# Patient Record
Sex: Male | Born: 1940 | Race: White | Hispanic: No | Marital: Married | Smoking: Former smoker
Health system: Southern US, Community
[De-identification: ages and names within clinical notes are randomized; demographics above are authoritative.]

## PROBLEM LIST (undated history)

## (undated) DIAGNOSIS — E291 Testicular hypofunction: Secondary | ICD-10-CM

## (undated) DIAGNOSIS — I350 Nonrheumatic aortic (valve) stenosis: Secondary | ICD-10-CM

## (undated) DIAGNOSIS — I10 Essential (primary) hypertension: Secondary | ICD-10-CM

## (undated) DIAGNOSIS — E785 Hyperlipidemia, unspecified: Secondary | ICD-10-CM

## (undated) DIAGNOSIS — I739 Peripheral vascular disease, unspecified: Secondary | ICD-10-CM

## (undated) DIAGNOSIS — N289 Disorder of kidney and ureter, unspecified: Secondary | ICD-10-CM

## (undated) DIAGNOSIS — K859 Acute pancreatitis without necrosis or infection, unspecified: Secondary | ICD-10-CM

## (undated) DIAGNOSIS — R011 Cardiac murmur, unspecified: Secondary | ICD-10-CM

## (undated) DIAGNOSIS — T7840XA Allergy, unspecified, initial encounter: Secondary | ICD-10-CM

## (undated) HISTORY — DX: Allergy, unspecified, initial encounter: T78.40XA

## (undated) HISTORY — DX: Nonrheumatic aortic (valve) stenosis: I35.0

## (undated) HISTORY — DX: Hyperlipidemia, unspecified: E78.5

## (undated) HISTORY — DX: Essential (primary) hypertension: I10

## (undated) HISTORY — DX: Acute pancreatitis without necrosis or infection, unspecified: K85.90

## (undated) HISTORY — DX: Disorder of kidney and ureter, unspecified: N28.9

## (undated) HISTORY — DX: Testicular hypofunction: E29.1

## (undated) HISTORY — PX: EYE SURGERY: SHX253

## (undated) HISTORY — PX: TONSILLECTOMY: SUR1361

## (undated) HISTORY — DX: Peripheral vascular disease, unspecified: I73.9

## (undated) HISTORY — PX: CARDIAC CATHETERIZATION: SHX172

---

## 1999-12-31 ENCOUNTER — Encounter: Payer: Self-pay | Admitting: Internal Medicine

## 1999-12-31 ENCOUNTER — Ambulatory Visit (HOSPITAL_COMMUNITY): Admission: RE | Admit: 1999-12-31 | Discharge: 1999-12-31 | Payer: Self-pay | Admitting: Internal Medicine

## 2004-11-03 ENCOUNTER — Ambulatory Visit: Payer: Self-pay | Admitting: Internal Medicine

## 2004-11-07 ENCOUNTER — Ambulatory Visit: Payer: Self-pay | Admitting: Internal Medicine

## 2004-12-24 ENCOUNTER — Ambulatory Visit: Payer: Self-pay | Admitting: Internal Medicine

## 2006-02-10 ENCOUNTER — Ambulatory Visit: Payer: Self-pay | Admitting: Internal Medicine

## 2006-02-11 ENCOUNTER — Ambulatory Visit: Payer: Self-pay | Admitting: Internal Medicine

## 2006-06-08 ENCOUNTER — Ambulatory Visit: Payer: Self-pay | Admitting: Internal Medicine

## 2006-06-11 ENCOUNTER — Ambulatory Visit: Payer: Self-pay | Admitting: Internal Medicine

## 2006-06-18 ENCOUNTER — Ambulatory Visit: Payer: Self-pay | Admitting: Internal Medicine

## 2006-10-06 ENCOUNTER — Ambulatory Visit: Payer: Self-pay | Admitting: Internal Medicine

## 2006-10-06 LAB — CONVERTED CEMR LAB
ALT: 33 units/L (ref 0–40)
AST: 20 units/L (ref 0–37)
Albumin: 3.5 g/dL (ref 3.5–5.2)
Alkaline Phosphatase: 64 units/L (ref 39–117)
BUN: 11 mg/dL (ref 6–23)
CO2: 29 meq/L (ref 19–32)
Calcium: 9 mg/dL (ref 8.4–10.5)
Chloride: 105 meq/L (ref 96–112)
Chol/HDL Ratio, serum: 4.6
Cholesterol: 199 mg/dL (ref 0–200)
Creatinine, Ser: 1.1 mg/dL (ref 0.4–1.5)
GFR calc non Af Amer: 71 mL/min
Glomerular Filtration Rate, Af Am: 86 mL/min/{1.73_m2}
Glucose, Bld: 169 mg/dL — ABNORMAL HIGH (ref 70–99)
HDL: 43 mg/dL (ref >39.0)
Hgb A1c MFr Bld: 7.2 % — ABNORMAL HIGH (ref 4.6–6.0)
LDL Cholesterol: 122 mg/dL — ABNORMAL HIGH (ref 0–99)
Potassium: 3.7 meq/L (ref 3.5–5.1)
Sodium: 139 meq/L (ref 135–145)
Total Bilirubin: 0.6 mg/dL (ref 0.3–1.2)
Total Protein: 6.5 g/dL (ref 6.0–8.3)
Triglyceride fasting, serum: 171 mg/dL — ABNORMAL HIGH (ref 0–149)
VLDL: 34 mg/dL (ref 0–40)

## 2006-10-07 ENCOUNTER — Ambulatory Visit: Payer: Self-pay | Admitting: Internal Medicine

## 2006-11-30 DIAGNOSIS — K859 Acute pancreatitis without necrosis or infection, unspecified: Secondary | ICD-10-CM

## 2006-11-30 HISTORY — DX: Acute pancreatitis without necrosis or infection, unspecified: K85.90

## 2006-12-07 ENCOUNTER — Ambulatory Visit: Payer: Self-pay | Admitting: Internal Medicine

## 2007-01-17 ENCOUNTER — Ambulatory Visit: Payer: Self-pay | Admitting: Internal Medicine

## 2007-06-29 ENCOUNTER — Ambulatory Visit: Payer: Self-pay | Admitting: Internal Medicine

## 2007-06-29 LAB — CONVERTED CEMR LAB
ALT: 18 units/L (ref 0–53)
AST: 16 units/L (ref 0–37)
Albumin: 3.8 g/dL (ref 3.5–5.2)
Alkaline Phosphatase: 70 units/L (ref 39–117)
Amylase: 69 units/L (ref 27–131)
BUN: 9 mg/dL (ref 6–23)
Bilirubin, Direct: 0.3 mg/dL (ref 0.0–0.3)
CO2: 29 meq/L (ref 19–32)
Calcium: 9.4 mg/dL (ref 8.4–10.5)
Chloride: 99 meq/L (ref 96–112)
Creatinine, Ser: 1.3 mg/dL (ref 0.4–1.5)
GFR calc Af Amer: 71 mL/min
GFR calc non Af Amer: 58 mL/min
Glucose, Bld: 176 mg/dL — ABNORMAL HIGH (ref 70–99)
Lipase: 72 units/L — ABNORMAL HIGH (ref 11.0–59.0)
Potassium: 3.7 meq/L (ref 3.5–5.1)
Sodium: 136 meq/L (ref 135–145)
Total Bilirubin: 1.7 mg/dL — ABNORMAL HIGH (ref 0.3–1.2)
Total Protein: 7.2 g/dL (ref 6.0–8.3)

## 2007-06-30 ENCOUNTER — Ambulatory Visit: Payer: Self-pay | Admitting: Internal Medicine

## 2007-06-30 LAB — CONVERTED CEMR LAB
ALT: 19 units/L (ref 0–53)
AST: 20 units/L (ref 0–37)
Albumin: 3.6 g/dL (ref 3.5–5.2)
Alkaline Phosphatase: 63 units/L (ref 39–117)
Amylase: 67 units/L (ref 27–131)
Bilirubin, Direct: 0.2 mg/dL (ref 0.0–0.3)
Lipase: 87 units/L — ABNORMAL HIGH (ref 11.0–59.0)
Total Bilirubin: 1.3 mg/dL — ABNORMAL HIGH (ref 0.3–1.2)
Total Protein: 7.5 g/dL (ref 6.0–8.3)

## 2007-07-01 DIAGNOSIS — F528 Other sexual dysfunction not due to a substance or known physiological condition: Secondary | ICD-10-CM | POA: Insufficient documentation

## 2007-07-01 DIAGNOSIS — R609 Edema, unspecified: Secondary | ICD-10-CM | POA: Insufficient documentation

## 2007-07-01 DIAGNOSIS — E785 Hyperlipidemia, unspecified: Secondary | ICD-10-CM | POA: Insufficient documentation

## 2007-07-01 DIAGNOSIS — E291 Testicular hypofunction: Secondary | ICD-10-CM | POA: Insufficient documentation

## 2007-07-01 DIAGNOSIS — B351 Tinea unguium: Secondary | ICD-10-CM | POA: Insufficient documentation

## 2007-07-01 DIAGNOSIS — I10 Essential (primary) hypertension: Secondary | ICD-10-CM | POA: Insufficient documentation

## 2007-07-28 ENCOUNTER — Ambulatory Visit: Payer: Self-pay | Admitting: Internal Medicine

## 2007-07-28 LAB — CONVERTED CEMR LAB
ALT: 18 units/L (ref 0–53)
AST: 16 units/L (ref 0–37)
Albumin: 3.6 g/dL (ref 3.5–5.2)
Alkaline Phosphatase: 58 units/L (ref 39–117)
BUN: 14 mg/dL (ref 6–23)
Bilirubin, Direct: 0.1 mg/dL (ref 0.0–0.3)
CO2: 30 meq/L (ref 19–32)
Calcium: 9.2 mg/dL (ref 8.4–10.5)
Chloride: 105 meq/L (ref 96–112)
Creatinine, Ser: 1 mg/dL (ref 0.4–1.5)
GFR calc Af Amer: 96 mL/min
GFR calc non Af Amer: 79 mL/min
Glucose, Bld: 138 mg/dL — ABNORMAL HIGH (ref 70–99)
Lipase: 47 units/L (ref 11.0–59.0)
Potassium: 4.1 meq/L (ref 3.5–5.1)
Sodium: 142 meq/L (ref 135–145)
Total Bilirubin: 0.5 mg/dL (ref 0.3–1.2)
Total Protein: 6.6 g/dL (ref 6.0–8.3)

## 2007-08-03 ENCOUNTER — Ambulatory Visit: Payer: Self-pay | Admitting: Internal Medicine

## 2007-09-22 ENCOUNTER — Ambulatory Visit: Payer: Self-pay | Admitting: Internal Medicine

## 2007-11-01 ENCOUNTER — Ambulatory Visit: Payer: Self-pay | Admitting: Internal Medicine

## 2007-11-01 DIAGNOSIS — E118 Type 2 diabetes mellitus with unspecified complications: Secondary | ICD-10-CM | POA: Insufficient documentation

## 2007-11-01 LAB — CONVERTED CEMR LAB: Blood Glucose, Fingerstick: 139

## 2008-01-09 ENCOUNTER — Encounter: Payer: Self-pay | Admitting: Internal Medicine

## 2008-02-15 ENCOUNTER — Emergency Department (HOSPITAL_COMMUNITY): Admission: EM | Admit: 2008-02-15 | Discharge: 2008-02-15 | Payer: Self-pay | Admitting: Emergency Medicine

## 2008-03-19 ENCOUNTER — Ambulatory Visit: Payer: Self-pay | Admitting: Internal Medicine

## 2008-03-20 ENCOUNTER — Ambulatory Visit: Payer: Self-pay | Admitting: Internal Medicine

## 2008-03-20 LAB — CONVERTED CEMR LAB
ALT: 30 units/L (ref 0–53)
Albumin: 3.8 g/dL (ref 3.5–5.2)
Alkaline Phosphatase: 47 units/L (ref 39–117)
BUN: 12 mg/dL (ref 6–23)
CO2: 31 meq/L (ref 19–32)
Calcium: 9.2 mg/dL (ref 8.4–10.5)
GFR calc Af Amer: 85 mL/min
Glucose, Bld: 157 mg/dL — ABNORMAL HIGH (ref 70–99)
HDL: 45.5 mg/dL (ref >39.0)
Potassium: 3.8 meq/L (ref 3.5–5.1)
TSH: 2.62 microintl units/mL (ref 0.35–5.50)
Total Protein: 6.5 g/dL (ref 6.0–8.3)
Triglycerides: 99 mg/dL (ref 0–149)

## 2008-05-29 ENCOUNTER — Encounter: Payer: Self-pay | Admitting: Internal Medicine

## 2008-07-27 ENCOUNTER — Ambulatory Visit: Payer: Self-pay | Admitting: Internal Medicine

## 2008-07-27 LAB — CONVERTED CEMR LAB
BUN: 14 mg/dL (ref 6–23)
CO2: 30 meq/L (ref 19–32)
Calcium: 9.3 mg/dL (ref 8.4–10.5)
Chloride: 106 meq/L (ref 96–112)
Creatinine, Ser: 1.1 mg/dL (ref 0.4–1.5)
Hgb A1c MFr Bld: 6.2 % — ABNORMAL HIGH (ref 4.6–6.0)

## 2008-07-31 ENCOUNTER — Ambulatory Visit: Payer: Self-pay | Admitting: Internal Medicine

## 2008-09-11 ENCOUNTER — Ambulatory Visit: Payer: Self-pay | Admitting: Internal Medicine

## 2008-09-13 ENCOUNTER — Telehealth: Payer: Self-pay | Admitting: Internal Medicine

## 2008-09-14 ENCOUNTER — Ambulatory Visit: Payer: Self-pay | Admitting: Internal Medicine

## 2008-10-22 ENCOUNTER — Telehealth: Payer: Self-pay | Admitting: Internal Medicine

## 2008-12-03 ENCOUNTER — Ambulatory Visit: Payer: Self-pay | Admitting: Internal Medicine

## 2008-12-03 LAB — CONVERTED CEMR LAB
Calcium: 9.5 mg/dL (ref 8.4–10.5)
Direct LDL: 99.9 mg/dL
GFR calc Af Amer: 70 mL/min
GFR calc non Af Amer: 58 mL/min
Glucose, Bld: 150 mg/dL — ABNORMAL HIGH (ref 70–99)
HDL: 54 mg/dL (ref >39.0)
Hgb A1c MFr Bld: 6.5 % — ABNORMAL HIGH (ref 4.6–6.0)
Potassium: 4.1 meq/L (ref 3.5–5.1)
Sodium: 142 meq/L (ref 135–145)
Total CHOL/HDL Ratio: 3.6
Triglycerides: 215 mg/dL (ref 0–149)
VLDL: 43 mg/dL — ABNORMAL HIGH (ref 0–40)

## 2008-12-05 ENCOUNTER — Ambulatory Visit: Payer: Self-pay | Admitting: Internal Medicine

## 2008-12-28 ENCOUNTER — Telehealth: Payer: Self-pay | Admitting: Internal Medicine

## 2009-02-15 ENCOUNTER — Ambulatory Visit: Payer: Self-pay | Admitting: Internal Medicine

## 2009-02-15 DIAGNOSIS — R9431 Abnormal electrocardiogram [ECG] [EKG]: Secondary | ICD-10-CM | POA: Insufficient documentation

## 2009-02-20 ENCOUNTER — Ambulatory Visit: Payer: Self-pay | Admitting: Internal Medicine

## 2009-02-20 ENCOUNTER — Inpatient Hospital Stay (HOSPITAL_COMMUNITY): Admission: AD | Admit: 2009-02-20 | Discharge: 2009-02-21 | Payer: Self-pay | Admitting: Internal Medicine

## 2009-02-20 ENCOUNTER — Ambulatory Visit: Payer: Self-pay | Admitting: Cardiology

## 2009-02-20 ENCOUNTER — Encounter: Payer: Self-pay | Admitting: Internal Medicine

## 2009-02-22 ENCOUNTER — Telehealth: Payer: Self-pay | Admitting: Internal Medicine

## 2009-02-25 ENCOUNTER — Telehealth (INDEPENDENT_AMBULATORY_CARE_PROVIDER_SITE_OTHER): Payer: Self-pay | Admitting: *Deleted

## 2009-02-25 ENCOUNTER — Ambulatory Visit: Payer: Self-pay | Admitting: Internal Medicine

## 2009-02-25 DIAGNOSIS — I359 Nonrheumatic aortic valve disorder, unspecified: Secondary | ICD-10-CM | POA: Insufficient documentation

## 2009-02-25 DIAGNOSIS — I739 Peripheral vascular disease, unspecified: Secondary | ICD-10-CM | POA: Insufficient documentation

## 2009-02-26 DIAGNOSIS — N183 Chronic kidney disease, stage 3 unspecified: Secondary | ICD-10-CM | POA: Insufficient documentation

## 2009-02-26 DIAGNOSIS — N1831 Chronic kidney disease, stage 3a: Secondary | ICD-10-CM | POA: Insufficient documentation

## 2009-02-27 ENCOUNTER — Telehealth (INDEPENDENT_AMBULATORY_CARE_PROVIDER_SITE_OTHER): Payer: Self-pay

## 2009-02-28 ENCOUNTER — Ambulatory Visit: Payer: Self-pay

## 2009-02-28 ENCOUNTER — Encounter: Payer: Self-pay | Admitting: Internal Medicine

## 2009-03-05 ENCOUNTER — Telehealth: Payer: Self-pay | Admitting: Internal Medicine

## 2009-03-11 ENCOUNTER — Ambulatory Visit: Payer: Self-pay | Admitting: Internal Medicine

## 2009-03-11 LAB — CONVERTED CEMR LAB: Cholesterol, target level: 200 mg/dL

## 2009-04-10 ENCOUNTER — Ambulatory Visit: Payer: Self-pay | Admitting: Internal Medicine

## 2009-04-10 DIAGNOSIS — E8881 Metabolic syndrome: Secondary | ICD-10-CM | POA: Insufficient documentation

## 2009-04-16 ENCOUNTER — Telehealth: Payer: Self-pay | Admitting: Internal Medicine

## 2009-05-09 ENCOUNTER — Encounter: Admission: RE | Admit: 2009-05-09 | Discharge: 2009-05-23 | Payer: Self-pay | Admitting: Internal Medicine

## 2009-05-09 ENCOUNTER — Encounter: Payer: Self-pay | Admitting: Internal Medicine

## 2009-06-13 ENCOUNTER — Ambulatory Visit: Payer: Self-pay | Admitting: Internal Medicine

## 2009-06-13 LAB — CONVERTED CEMR LAB
ALT: 26 units/L (ref 0–53)
Albumin: 3.8 g/dL (ref 3.5–5.2)
Alkaline Phosphatase: 53 units/L (ref 39–117)
Basophils Relative: 1.1 % (ref 0.0–3.0)
Bilirubin Urine: NEGATIVE
Bilirubin, Direct: 0.1 mg/dL (ref 0.0–0.3)
CO2: 29 meq/L (ref 19–32)
Chloride: 102 meq/L (ref 96–112)
Cholesterol: 192 mg/dL (ref 0–200)
Creatinine, Ser: 1.3 mg/dL (ref 0.4–1.5)
Eosinophils Absolute: 0.2 10*3/uL (ref 0.0–0.7)
Eosinophils Relative: 4.1 % (ref 0.0–5.0)
Hemoglobin, Urine: NEGATIVE
Hemoglobin: 14.8 g/dL (ref 13.0–17.0)
Ketones, ur: NEGATIVE mg/dL
Lymphocytes Relative: 30.2 % (ref 12.0–46.0)
MCHC: 34.9 g/dL (ref 30.0–36.0)
MCV: 91.1 fL (ref 78.0–100.0)
Monocytes Absolute: 0.7 10*3/uL (ref 0.1–1.0)
Neutro Abs: 2.4 10*3/uL (ref 1.4–7.7)
Neutrophils Relative %: 51.2 % (ref 43.0–77.0)
Potassium: 3.9 meq/L (ref 3.5–5.1)
RBC: 4.67 M/uL (ref 4.22–5.81)
Sodium: 138 meq/L (ref 135–145)
Total CHOL/HDL Ratio: 4
Total Protein, Urine: NEGATIVE mg/dL
Total Protein: 6.7 g/dL (ref 6.0–8.3)
VLDL: 42.8 mg/dL — ABNORMAL HIGH (ref 0.0–40.0)
WBC: 4.9 10*3/uL (ref 4.5–10.5)
pH: 6 (ref 5.0–8.0)

## 2009-06-14 ENCOUNTER — Encounter: Payer: Self-pay | Admitting: Internal Medicine

## 2009-07-01 ENCOUNTER — Telehealth: Payer: Self-pay | Admitting: Internal Medicine

## 2009-08-01 ENCOUNTER — Telehealth: Payer: Self-pay | Admitting: Internal Medicine

## 2009-08-15 ENCOUNTER — Telehealth: Payer: Self-pay | Admitting: Internal Medicine

## 2009-08-29 ENCOUNTER — Telehealth: Payer: Self-pay | Admitting: Internal Medicine

## 2009-09-02 ENCOUNTER — Ambulatory Visit: Payer: Self-pay | Admitting: Internal Medicine

## 2009-09-02 DIAGNOSIS — N400 Enlarged prostate without lower urinary tract symptoms: Secondary | ICD-10-CM | POA: Insufficient documentation

## 2009-09-02 LAB — CONVERTED CEMR LAB
BUN: 22 mg/dL (ref 6–23)
CO2: 30 meq/L (ref 19–32)
Chloride: 104 meq/L (ref 96–112)
Glucose, Bld: 114 mg/dL — ABNORMAL HIGH (ref 70–99)
Potassium: 4.2 meq/L (ref 3.5–5.1)
Sodium: 141 meq/L (ref 135–145)

## 2009-09-17 ENCOUNTER — Ambulatory Visit: Payer: Self-pay | Admitting: Internal Medicine

## 2009-10-01 ENCOUNTER — Ambulatory Visit: Payer: Self-pay | Admitting: Internal Medicine

## 2010-01-06 ENCOUNTER — Ambulatory Visit: Payer: Self-pay | Admitting: Internal Medicine

## 2010-01-06 LAB — CONVERTED CEMR LAB
ALT: 44 units/L (ref 0–53)
BUN: 18 mg/dL (ref 6–23)
Bilirubin Urine: NEGATIVE
CO2: 33 meq/L — ABNORMAL HIGH (ref 19–32)
Chloride: 104 meq/L (ref 96–112)
Eosinophils Absolute: 0.2 10*3/uL (ref 0.0–0.7)
Eosinophils Relative: 3.7 % (ref 0.0–5.0)
Glucose, Bld: 131 mg/dL — ABNORMAL HIGH (ref 70–99)
HCT: 43.3 % (ref 39.0–52.0)
Hemoglobin, Urine: NEGATIVE
Hgb A1c MFr Bld: 6.5 % (ref 4.6–6.5)
Leukocytes, UA: NEGATIVE
Lymphs Abs: 1.2 10*3/uL (ref 0.7–4.0)
MCHC: 32.8 g/dL (ref 30.0–36.0)
MCV: 96.4 fL (ref 78.0–100.0)
Monocytes Absolute: 0.5 10*3/uL (ref 0.1–1.0)
Nitrite: NEGATIVE
Platelets: 213 10*3/uL (ref 150.0–400.0)
Potassium: 4 meq/L (ref 3.5–5.1)
RDW: 12 % (ref 11.5–14.6)
Total Bilirubin: 0.8 mg/dL (ref 0.3–1.2)
Total Protein: 7.2 g/dL (ref 6.0–8.3)
WBC: 4.8 10*3/uL (ref 4.5–10.5)
pH: 6.5 (ref 5.0–8.0)

## 2010-01-14 ENCOUNTER — Telehealth: Payer: Self-pay | Admitting: Internal Medicine

## 2010-01-14 ENCOUNTER — Encounter: Payer: Self-pay | Admitting: Internal Medicine

## 2010-02-27 ENCOUNTER — Telehealth: Payer: Self-pay | Admitting: Internal Medicine

## 2010-04-03 ENCOUNTER — Telehealth: Payer: Self-pay | Admitting: Internal Medicine

## 2010-05-08 ENCOUNTER — Ambulatory Visit: Payer: Self-pay | Admitting: Internal Medicine

## 2010-05-08 LAB — CONVERTED CEMR LAB
BUN: 17 mg/dL (ref 6–23)
Bilirubin Urine: NEGATIVE
CO2: 29 meq/L (ref 19–32)
Chloride: 104 meq/L (ref 96–112)
Creatinine, Ser: 1.2 mg/dL (ref 0.4–1.5)
Ketones, ur: NEGATIVE mg/dL
Total Protein, Urine: NEGATIVE mg/dL
pH: 6 (ref 5.0–8.0)

## 2010-06-09 ENCOUNTER — Ambulatory Visit: Payer: Self-pay | Admitting: Internal Medicine

## 2010-07-22 ENCOUNTER — Ambulatory Visit: Payer: Self-pay | Admitting: Internal Medicine

## 2010-07-22 DIAGNOSIS — K219 Gastro-esophageal reflux disease without esophagitis: Secondary | ICD-10-CM | POA: Insufficient documentation

## 2010-07-25 ENCOUNTER — Ambulatory Visit: Payer: Self-pay | Admitting: Internal Medicine

## 2010-07-25 ENCOUNTER — Encounter: Payer: Self-pay | Admitting: Internal Medicine

## 2010-08-06 ENCOUNTER — Ambulatory Visit: Payer: Self-pay

## 2010-08-06 ENCOUNTER — Ambulatory Visit: Payer: Self-pay | Admitting: Cardiology

## 2010-08-06 ENCOUNTER — Encounter: Payer: Self-pay | Admitting: Internal Medicine

## 2010-08-06 ENCOUNTER — Ambulatory Visit (HOSPITAL_COMMUNITY): Admission: RE | Admit: 2010-08-06 | Discharge: 2010-08-06 | Payer: Self-pay | Admitting: Internal Medicine

## 2010-08-08 ENCOUNTER — Ambulatory Visit: Payer: Self-pay | Admitting: Internal Medicine

## 2010-09-03 ENCOUNTER — Telehealth: Payer: Self-pay | Admitting: Internal Medicine

## 2010-09-08 ENCOUNTER — Ambulatory Visit: Payer: Self-pay | Admitting: Internal Medicine

## 2010-09-08 LAB — CONVERTED CEMR LAB
AST: 21 units/L (ref 0–37)
BUN: 18 mg/dL (ref 6–23)
Basophils Absolute: 0.1 10*3/uL (ref 0.0–0.1)
Bilirubin Urine: NEGATIVE
Calcium: 9.8 mg/dL (ref 8.4–10.5)
Cholesterol: 183 mg/dL (ref 0–200)
Eosinophils Absolute: 0.3 10*3/uL (ref 0.0–0.7)
GFR calc non Af Amer: 55.77 mL/min (ref >60)
Glucose, Bld: 113 mg/dL — ABNORMAL HIGH (ref 70–99)
HCT: 43 % (ref 39.0–52.0)
HDL: 38.8 mg/dL — ABNORMAL LOW (ref >39.00)
Hemoglobin, Urine: NEGATIVE
Hgb A1c MFr Bld: 7.1 % — ABNORMAL HIGH (ref 4.6–6.5)
Leukocytes, UA: NEGATIVE
Lymphs Abs: 1.5 10*3/uL (ref 0.7–4.0)
MCHC: 34.7 g/dL (ref 30.0–36.0)
Monocytes Relative: 8.8 % (ref 3.0–12.0)
Nitrite: NEGATIVE
PSA: 0.76 ng/mL (ref 0.10–4.00)
Platelets: 271 10*3/uL (ref 150.0–400.0)
Potassium: 4.1 meq/L (ref 3.5–5.1)
RDW: 13.1 % (ref 11.5–14.6)
TSH: 2.81 microintl units/mL (ref 0.35–5.50)
Total Bilirubin: 0.7 mg/dL (ref 0.3–1.2)
Urobilinogen, UA: 0.2 (ref 0.0–1.0)
VLDL: 27.2 mg/dL (ref 0.0–40.0)

## 2010-12-30 NOTE — Letter (Signed)
Summary: Results Follow-up Letter  [facility]  [address] [place], [place] [address]   Phone: [phone]  Fax: [phone]    05/08/2010  [address] [place], [place]  [address]  Dear Mr. SCHUPP,   The following are the results of your recent test(s):  Test     Result     A1C=6.5     excellent Kidney/urine   normal   _________________________________________________________  Please call for an appointment as directed _________________________________________________________ _________________________________________________________ _________________________________________________________  Sincerely,  Sanda Linger MD  [facility]

## 2010-12-30 NOTE — Progress Notes (Signed)
Summary: RESULTS  Phone Note Call from Patient Call back at Home Phone [phone]   Caller: Patient Call For: Dr Yetta Barre Summary of Call: Pt requesting a call regarding his lab done 2/7. Please advise. Initial call taken by: Verdell Face,  January 14, 2010 12:34 PM  Follow-up for Phone Call        Please advise on results of labs. Follow-up by: Lamar Sprinkles, CMA,  January 14, 2010 1:26 PM  Additional Follow-up for Phone Call Additional follow up Details #1::        everything looks great Additional Follow-up by: [name] MD,  January 14, 2010 6:51 PM    Additional Follow-up for Phone Call Additional follow up Details #2::    Pt informed  Follow-up by: Lamar Sprinkles, CMA,  January 16, 2010 9:42 AM

## 2010-12-30 NOTE — Assessment & Plan Note (Signed)
Summary: 4 mos f/u #/cd   Vital Signs:  Patient profile:   70 year old male Height:      71 inches Weight:      250 pounds BMI:     34.99 O2 Sat:      96 % on Room air Temp:     98.6 degrees F oral Pulse rate:   96 / minute Pulse rhythm:   regular Resp:     16 per minute BP sitting:   124 / 70  (left arm) Cuff size:   large  Vitals Entered By: Rock Nephew CMA (January 06, 2010 8:37 AM)  Nutrition Counseling: Patient's BMI is greater than 25 and therefore counseled on weight management options.  O2 Flow:  Room air CC: follow-up visit, Lipid Management Is Patient Diabetic? Yes Did you bring your meter with you today? No Pain Assessment Patient in pain? no        Primary Care Provider:  [name] MD  CC:  follow-up visit and Lipid Management.  History of Present Illness:  Hypertension Follow-Up      This is a 70 year old man who presents for Hypertension follow-up.  The patient denies lightheadedness, urinary frequency, headaches, edema, impotence, rash, and fatigue.  The patient denies the following associated symptoms: chest pain, chest pressure, exercise intolerance, dyspnea, palpitations, syncope, leg edema, and pedal edema.  Compliance with medications (by patient report) has been near 100%.  The patient reports that dietary compliance has been poor.  The patient reports exercising occasionally.    Lipid Management History:      Positive NCEP/ATP III risk factors include male age 66 years old or older, diabetes, hypertension, ASHD (either angina/prior MI/prior CABG), and peripheral vascular disease.  Negative NCEP/ATP III risk factors include no family history for ischemic heart disease, non-tobacco-user status, no prior stroke/TIA, and no history of aortic aneurysm.        The patient states that he knows about the "Therapeutic Lifestyle Change" diet.  His compliance with the TLC diet is fair.  The patient expresses understanding of adjunctive measures for  cholesterol lowering.  Adjunctive measures started by the patient include aerobic exercise, fiber, ASA, and limit alcohol consumpton.  He expresses no side effects from his lipid-lowering medication.  The patient denies any symptoms to suggest myopathy or liver disease.     Preventive Screening-Counseling & Management  Alcohol-Tobacco     Alcohol drinks/day: 2     Alcohol type: wine     >5/day in last 3 mos: no     Alcohol Counseling: not indicated; use of alcohol is not excessive or problematic     Feels need to cut down: no     Feels annoyed by complaints: no     Feels guilty re: drinking: no     Needs 'eye opener' in am: no     Smoking Status: never  Hep-HIV-STD-Contraception     Hepatitis Risk: no risk noted     HIV Risk: no risk noted     STD Risk: no risk noted  Medications Prior to Update: 1)  Lovastatin 40 Mg  Tabs (Lovastatin) .... Once Daily 2)  Tenoretic 100 100-25 Mg  Tabs (Atenolol-Chlorthalidone) .Marland Kitchen.. 1 By Mouth Qd 3)  Baby Aspirin 81 Mg  Chew (Aspirin) .... Once Daily 4)  Celexa 10 Mg  Tabs (Citalopram Hydrobromide) .Marland Kitchen.. 1 By Mouth Qd 5)  Vitamin D3 1000 Unit  Tabs (Cholecalciferol) .Marland Kitchen.. 1 Qd 6)  Glimepiride 2 Mg  Tabs (Glimepiride) .... Once Daily 7)  Tramadol Hcl 50 Mg Tabs (Tramadol Hcl) .Marland Kitchen.. 1-2 By Mouth Q 6 Hours Orn For Pain 8)  Contour Blood Glucose System W/device Kit (Blood Glucose Monitoring Suppl) .... Use Two Times A Day As Directed 9)  Bayer Contour Test  Strp (Glucose Blood) .... Use Two Times A Day As Directed 10)  Losartan Potassium 100 Mg Tabs (Losartan Potassium) .... Once Daily For High Blood Pressure, This Replaces Micardis  Current Medications (verified): 1)  Lovastatin 40 Mg  Tabs (Lovastatin) .... Once Daily 2)  Tenoretic 100 100-25 Mg  Tabs (Atenolol-Chlorthalidone) .Marland Kitchen.. 1 By Mouth Qd 3)  Baby Aspirin 81 Mg  Chew (Aspirin) .... Once Daily 4)  Celexa 10 Mg  Tabs (Citalopram Hydrobromide) .Marland Kitchen.. 1 By Mouth Qd 5)  Vitamin D3 1000 Unit  Tabs  (Cholecalciferol) .Marland Kitchen.. 1 Qd 6)  Glimepiride 2 Mg Tabs (Glimepiride) .... Once Daily 7)  Tramadol Hcl 50 Mg Tabs (Tramadol Hcl) .Marland Kitchen.. 1-2 By Mouth Q 6 Hours Orn For Pain 8)  Contour Blood Glucose System W/device Kit (Blood Glucose Monitoring Suppl) .... Use Two Times A Day As Directed 9)  Bayer Contour Test  Strp (Glucose Blood) .... Use Two Times A Day As Directed 10)  Losartan Potassium 100 Mg Tabs (Losartan Potassium) .... Once Daily For High Blood Pressure, This Replaces Micardis  Allergies (verified): 1)  ! Lipitor (Atorvastatin Calcium) 2)  Lotensin  Past History:  Past Medical History: Reviewed history from 02/25/2009 and no changes required. Hyperlipidemia Hypertension Diabetes mellitus, type II Hypogonadism Pancreatitis 2008 Renal insufficiency  Past Surgical History: Reviewed history from 02/15/2009 and no changes required. Denies surgical history  Family History: Reviewed history from 11/01/2007 and no changes required. Family History Hypertension  Social History: Reviewed history from 02/25/2009 and no changes required. Retired Married Former Smoker 1976 3 dogs Drug use-no Regular exercise-yes Alcohol use-yes  Review of Systems       The patient complains of weight gain.  The patient denies anorexia, chest pain, dyspnea on exertion, prolonged cough, headaches, hemoptysis, abdominal pain, difficulty walking, depression, and angioedema.   Endo:  Complains of weight change; denies cold intolerance, excessive hunger, excessive thirst, excessive urination, and polyuria.  Physical Exam  General:  alert, well-developed, well-nourished, well-hydrated, cooperative to examination, good hygiene, and overweight-appearing.   Mouth:  good dentition, no gingival abnormalities, no dental plaque, and pharynx pink and moist.   Neck:  supple, full ROM, no masses, no thyromegaly, no thyroid nodules or tenderness, normal carotid upstroke, no carotid bruits, and no cervical  lymphadenopathy.   Lungs:  Normal respiratory effort, chest expands symmetrically. Lungs are clear to auscultation, no crackles or wheezes. Heart:  Grade  1/6 systolic ejection murmur, heard best over RUSB and LLSB.  regular rhythm and no gallop.   Abdomen:  soft, non-tender, normal bowel sounds, no distention, no masses, no guarding, no rigidity, no rebound tenderness, no abdominal hernia, no inguinal hernia, no hepatomegaly, and no splenomegaly.   Msk:  normal ROM, no joint tenderness, no joint swelling, no joint warmth, and no redness over joints.   Pulses:  R and L carotid,radial,femoral,dorsalis pedis and posterior tibial pulses are full and equal bilaterally Extremities:  1+ left pedal edema and 1+ right pedal edema.   Neurologic:  alert & oriented X3, cranial nerves II-XII intact, strength normal in all extremities, sensation intact to light touch, sensation intact to pinprick, gait normal, and DTRs symmetrical and normal.   Skin:  turgor normal, color  normal, no rashes, no suspicious lesions, no ecchymoses, no petechiae, and no purpura.   Cervical Nodes:  No lymphadenopathy noted Psych:  Cognition and judgment appear intact. Alert and cooperative with normal attention span and concentration. No apparent delusions, illusions, hallucinations  Diabetes Management Exam:    Foot Exam (with socks and/or shoes not present):       Sensory-Pinprick/Light touch:          Left medial foot (L-4): normal          Left dorsal foot (L-5): normal          Left lateral foot (S-1): normal          Right medial foot (L-4): normal          Right dorsal foot (L-5): normal          Right lateral foot (S-1): normal       Sensory-Monofilament:          Left foot: normal          Right foot: normal       Inspection:          Left foot: normal          Right foot: normal       Nails:          Left foot: normal          Right foot: normal   Impression & Recommendations:  Problem # 1:  AORTIC STENOSIS  (ICD-424.1) Assessment Unchanged  His updated medication list for this problem includes:    Tenoretic 100 100-25 Mg Tabs (Atenolol-chlorthalidone) .Marland Kitchen... 1 by mouth qd    Baby Aspirin 81 Mg Chew (Aspirin) ..... Once daily  Problem # 2:  DIABETES MELLITUS, TYPE II (ICD-250.00) Assessment: Unchanged  His updated medication list for this problem includes:    Baby Aspirin 81 Mg Chew (Aspirin) ..... Once daily    Glimepiride 2 Mg Tabs (Glimepiride) ..... Once daily    Losartan Potassium 100 Mg Tabs (Losartan potassium) ..... Once daily for high blood pressure, this replaces micardis  Orders: Venipuncture (62952) TLB-BMP (Basic Metabolic Panel-BMET) (80048-METABOL) TLB-CBC Platelet - w/Differential (85025-CBCD) TLB-Hepatic/Liver Function Pnl (80076-HEPATIC) TLB-A1C / Hgb A1C (Glycohemoglobin) (83036-A1C) TLB-Udip w/ Micro (81001-URINE) Ophthalmology Referral (Ophthalmology)  Labs Reviewed: Creat: 1.4 (09/02/2009)     Last Eye Exam: normal (12/19/2008) Reviewed HgBA1c results: 6.1 (06/13/2009)  6.5 (12/03/2008)  Problem # 3:  HYPERTENSION (ICD-401.9) Assessment: Improved  His updated medication list for this problem includes:    Tenoretic 100 100-25 Mg Tabs (Atenolol-chlorthalidone) .Marland Kitchen... 1 by mouth qd    Losartan Potassium 100 Mg Tabs (Losartan potassium) ..... Once daily for high blood pressure, this replaces micardis  Orders: Venipuncture (84132) TLB-BMP (Basic Metabolic Panel-BMET) (80048-METABOL) TLB-CBC Platelet - w/Differential (85025-CBCD) TLB-Hepatic/Liver Function Pnl (80076-HEPATIC) TLB-A1C / Hgb A1C (Glycohemoglobin) (83036-A1C) TLB-Udip w/ Micro (81001-URINE)  BP today: 124/70 Prior BP: 134/82 (09/02/2009)  Prior 10 Yr Risk Heart Disease: N/A (02/20/2009)  Labs Reviewed: K+: 4.2 (09/02/2009) Creat: : 1.4 (09/02/2009)   Chol: 192 (06/13/2009)   HDL: 49.50 (06/13/2009)   LDL: DEL (12/03/2008)   TG: 214.0 (06/13/2009)  Problem # 4:  HYPERLIPIDEMIA  (ICD-272.4) Assessment: Unchanged  His updated medication list for this problem includes:    Lovastatin 40 Mg Tabs (Lovastatin) ..... Once daily  Orders: Venipuncture (44010) TLB-BMP (Basic Metabolic Panel-BMET) (80048-METABOL) TLB-CBC Platelet - w/Differential (85025-CBCD) TLB-Hepatic/Liver Function Pnl (80076-HEPATIC) TLB-A1C / Hgb A1C (Glycohemoglobin) (83036-A1C) TLB-Udip w/ Micro (81001-URINE)  Labs Reviewed: SGOT:  24 (06/13/2009)   SGPT: 26 (06/13/2009)  Lipid Goals: Chol Goal: 200 (03/11/2009)   HDL Goal: 40 (03/11/2009)   LDL Goal: 70 (03/11/2009)   TG Goal: 150 (03/11/2009)  Prior 10 Yr Risk Heart Disease: N/A (02/20/2009)   HDL:49.50 (06/13/2009), 54.0 (12/03/2008)  LDL:DEL (12/03/2008), DEL (03/19/2008)  Chol:192 (06/13/2009), 193 (12/03/2008)  Trig:214.0 (06/13/2009), 215 (12/03/2008)  Complete Medication List: 1)  Lovastatin 40 Mg Tabs (Lovastatin) .... Once daily 2)  Tenoretic 100 100-25 Mg Tabs (Atenolol-chlorthalidone) .Marland Kitchen.. 1 by mouth qd 3)  Baby Aspirin 81 Mg Chew (Aspirin) .... Once daily 4)  Celexa 10 Mg Tabs (Citalopram hydrobromide) .Marland Kitchen.. 1 by mouth qd 5)  Vitamin D3 1000 Unit Tabs (Cholecalciferol) .Marland Kitchen.. 1 qd 6)  Glimepiride 2 Mg Tabs (Glimepiride) .... Once daily 7)  Tramadol Hcl 50 Mg Tabs (Tramadol hcl) .Marland Kitchen.. 1-2 by mouth q 6 hours orn for pain 8)  Contour Blood Glucose System W/device Kit (Blood glucose monitoring suppl) .... Use two times a day as directed 9)  Bayer Contour Test Strp (Glucose blood) .... Use two times a day as directed 10)  Losartan Potassium 100 Mg Tabs (Losartan potassium) .... Once daily for high blood pressure, this replaces micardis  Lipid Assessment/Plan:      Based on NCEP/ATP III, the patient's risk factor category is "history of coronary disease, peripheral vascular disease, cerebrovascular disease, or aortic aneurysm along with either diabetes, current smoker, or LDL > 130 plus HDL < 40 plus triglycerides > 200".  The patient's  lipid goals are as follows: Total cholesterol goal is 200; LDL cholesterol goal is 70; HDL cholesterol goal is 40; Triglyceride goal is 150.    Patient Instructions: 1)  Please schedule a follow-up appointment in 4 months. 2)  It is important that you exercise regularly at least 20 minutes 5 times a week. If you develop chest pain, have severe difficulty breathing, or feel very tired , stop exercising immediately and seek medical attention. 3)  You need to lose weight. Consider a lower calorie diet and regular exercise.  4)  Check your blood sugars regularly. If your readings are usually above 200  or below 70 you should contact our office. 5)  It is important that your Diabetic A1c level is checked every 3 months. 6)  See your eye doctor yearly to check for diabetic eye damage. 7)  Check your feet each night for sore areas, calluses or signs of infection. 8)  Check your Blood Pressure regularly. If it is above 130/80: you should make an appointment.

## 2010-12-30 NOTE — Assessment & Plan Note (Signed)
Summary: indigestion/#/cd   Vital Signs:  Patient profile:   70 year old male Height:      71 inches Weight:      253 pounds BMI:     35.41 O2 Sat:      96 % on Room air Temp:     98.3 degrees F oral Pulse rate:   72 / minute Pulse rhythm:   regular Resp:     16 per minute BP sitting:   140 / 80  (left arm) Cuff size:   large  Vitals Entered By: Rock Nephew CMA (July 22, 2010 8:53 AM)  Nutrition Counseling: Patient's BMI is greater than 25 and therefore counseled on weight management options.  O2 Flow:  Room air CC: Pt c/o heartburn w/ occ nausea x this am, Heartburn, Lipid Management Is Patient Diabetic? Yes Did you bring your meter with you today? No Pain Assessment Patient in pain? no       Does patient need assistance? Functional Status Self care Ambulation Normal   Primary Care Provider:  [name] MD  CC:  Pt c/o heartburn w/ occ nausea x this am, Heartburn, and Lipid Management.  History of Present Illness:  Heartburn      This is a 70 year old man who presents with Heartburn.  The symptoms began 1 week ago.  The intensity is described as moderate.  The patient reports acid reflux and sour taste in mouth, but denies epigastric pain, chest pain, trouble swallowing, weight loss, and weight gain.  The patient denies the following alarm features: melena, dysphagia, hematemesis, vomiting, involuntary weight loss >5%, and history of anemia.  Symptoms are worse with spicy foods and lying down.  He had never started omeprazole and did not know it was prescribed.  Lipid Management History:      Positive NCEP/ATP III risk factors include male age 72 years old or older, diabetes, hypertension, ASHD (either angina/prior MI/prior CABG), and peripheral vascular disease.  Negative NCEP/ATP III risk factors include no family history for ischemic heart disease, non-tobacco-user status, no prior stroke/TIA, and no history of aortic aneurysm.        The patient states  that he knows about the "Therapeutic Lifestyle Change" diet.  His compliance with the TLC diet is fair.  The patient does not know about adjunctive measures for cholesterol lowering.  Adjunctive measures started by the patient include aerobic exercise, fiber, limit alcohol consumpton, and weight reduction.  He expresses no side effects from his lipid-lowering medication.  The patient denies any symptoms to suggest myopathy or liver disease.     Preventive Screening-Counseling & Management  Alcohol-Tobacco     Alcohol drinks/day: 2     Alcohol type: wine     >5/day in last 3 mos: no     Alcohol Counseling: not indicated; use of alcohol is not excessive or problematic     Feels need to cut down: no     Feels annoyed by complaints: no     Feels guilty re: drinking: no     Needs 'eye opener' in am: no     Smoking Status: never  Hep-HIV-STD-Contraception     Hepatitis Risk: no risk noted     HIV Risk: no risk noted     STD Risk: no risk noted      Sexual History:  currently monogamous.        Drug Use:  never.        Blood Transfusions:  no.  Clinical Review Panels:  Diabetes Management   HgBA1C:  6.5 (05/08/2010)   Creatinine:  1.2 (05/08/2010)   Last Dilated Eye Exam:  normal (04/30/2010)   Last Foot Exam:  yes (07/22/2010)   Last Flu Vaccine:  Fluvax 3+ (09/02/2009)   Last Pneumovax:  Pneumovax (06/11/2006)  CBC   WBC:  4.8 (01/06/2010)   RBC:  4.49 (01/06/2010)   Hgb:  14.2 (01/06/2010)   Hct:  43.3 (01/06/2010)   Platelets:  213.0 (01/06/2010)   MCV  96.4 (01/06/2010)   MCHC  32.8 (01/06/2010)   RDW  12.0 (01/06/2010)   PMN:  59.5 (01/06/2010)   Lymphs:  25.7 (01/06/2010)   Monos:  10.5 (01/06/2010)   Eosinophils:  3.7 (01/06/2010)   Basophil:  0.6 (01/06/2010)  Complete Metabolic Panel   Glucose:  125 (05/08/2010)   Sodium:  139 (05/08/2010)   Potassium:  4.1 (05/08/2010)   Chloride:  104 (05/08/2010)   CO2:  29 (05/08/2010)   BUN:  17 (05/08/2010)    Creatinine:  1.2 (05/08/2010)   Albumin:  3.9 (01/06/2010)   Total Protein:  7.2 (01/06/2010)   Calcium:  9.6 (05/08/2010)   Total Bili:  0.8 (01/06/2010)   Alk Phos:  55 (01/06/2010)   SGPT (ALT):  44 (01/06/2010)   SGOT (AST):  31 (01/06/2010)   Medications Prior to Update: 1)  Lovastatin 40 Mg  Tabs (Lovastatin) .... Once Daily 2)  Tenoretic 100 100-25 Mg  Tabs (Atenolol-Chlorthalidone) .Marland Kitchen.. 1 By Mouth Qd 3)  Baby Aspirin 81 Mg  Chew (Aspirin) .... Once Daily 4)  Celexa 10 Mg  Tabs (Citalopram Hydrobromide) .Marland Kitchen.. 1 By Mouth Qd 5)  Vitamin D3 1000 Unit  Tabs (Cholecalciferol) .Marland Kitchen.. 1 Qd 6)  Glimepiride 2 Mg Tabs (Glimepiride) .... Once Daily 7)  Contour Blood Glucose System W/device Kit (Blood Glucose Monitoring Suppl) .... Use Two Times A Day As Directed 8)  Bayer Contour Test  Strp (Glucose Blood) .... Use Two Times A Day As Directed 9)  Losartan Potassium 100 Mg Tabs (Losartan Potassium) .... Once Daily For High Blood Pressure, This Replaces Micardis 10)  Nasonex 50 Mcg/act Susp (Mometasone Furoate) .Marland Kitchen.. 1 Spray Each Nostril Every Morning 11)  Tessalon 200 Mg Caps (Benzonatate) .Marland Kitchen.. 1 By Mouth Three Times A Day X 5 Days, Then As Needed For Cough 12)  Omeprazole 20 Mg Cpdr (Omeprazole) .Marland Kitchen.. 1 By Mouth Once Daily X 7 Days, Then As Needed For Reflux 13)  Hydromet 5-1.5 Mg/61ml Syrp (Hydrocodone-Homatropine) .Marland Kitchen.. 10cc By Mouth Every 6 Hours As Needed For Severe Cough Symptoms  Current Medications (verified): 1)  Lovastatin 40 Mg  Tabs (Lovastatin) .... Once Daily 2)  Tenoretic 100 100-25 Mg  Tabs (Atenolol-Chlorthalidone) .Marland Kitchen.. 1 By Mouth Qd 3)  Baby Aspirin 81 Mg  Chew (Aspirin) .... Once Daily 4)  Celexa 10 Mg  Tabs (Citalopram Hydrobromide) .Marland Kitchen.. 1 By Mouth Qd 5)  Vitamin D3 1000 Unit  Tabs (Cholecalciferol) .Marland Kitchen.. 1 Qd 6)  Glimepiride 2 Mg Tabs (Glimepiride) .... Once Daily 7)  Contour Blood Glucose System W/device Kit (Blood Glucose Monitoring Suppl) .... Use Two Times A Day As  Directed 8)  Bayer Contour Test  Strp (Glucose Blood) .... Use Two Times A Day As Directed 9)  Losartan Potassium 100 Mg Tabs (Losartan Potassium) .... Once Daily For High Blood Pressure, This Replaces Micardis 10)  Nasonex 50 Mcg/act Susp (Mometasone Furoate) .Marland Kitchen.. 1 Spray Each Nostril Every Morning 11)  Dexilant 60 Mg Cpdr (Dexlansoprazole) .... One By Mouth Once  Daily For Heartburn  Allergies (verified): 1)  ! Lipitor (Atorvastatin Calcium) 2)  Lotensin  Comments:  Nurse/Medical Assistant: The patient's medications and allergies were reviewed with the patient and were updated in the Medication and Allergy Lists. Rock Nephew CMA (July 22, 2010 8:55 AM)  Past History:  Past Medical History: Last updated: 06/09/2010 Hyperlipidemia Hypertension Diabetes mellitus, type II Hypogonadism Pancreatitis 2008  Renal insufficiency  Past Surgical History: Last updated: 02/15/2009 Denies surgical history  Family History: Last updated: 11/01/2007 Family History Hypertension  Social History: Last updated: 02/25/2009 Retired Married Former Smoker 1976 3 dogs Drug use-no Regular exercise-yes Alcohol use-yes  Risk Factors: Alcohol Use: 2 (07/22/2010) >5 drinks/d w/in last 3 months: no (07/22/2010) Exercise: yes (02/15/2009)  Risk Factors: Smoking Status: never (07/22/2010)  Family History: Reviewed history from 11/01/2007 and no changes required. Family History Hypertension  Social History: Reviewed history from 02/25/2009 and no changes required. Retired Married Former Smoker 1976 3 dogs Drug use-no Regular exercise-yes Alcohol use-yes  Review of Systems       The patient complains of weight gain and severe indigestion/heartburn.  The patient denies anorexia, fever, weight loss, chest pain, syncope, dyspnea on exertion, peripheral edema, prolonged cough, headaches, hemoptysis, abdominal pain, melena, hematochezia, hematuria, suspicious skin lesions, difficulty  walking, depression, and enlarged lymph nodes.   Endo:  Denies cold intolerance, excessive hunger, excessive thirst, excessive urination, heat intolerance, polyuria, and weight change.  Physical Exam  General:  alert, well-developed, well-nourished, well-hydrated, appropriate dress, normal appearance, healthy-appearing, cooperative to examination, good hygiene, and overweight-appearing.   Head:  normocephalic, atraumatic, no abnormalities observed, and no abnormalities palpated.   Eyes:  vision grossly intact, pupils equal, pupils round, and pupils reactive to light.   Mouth:  Oral mucosa and oropharynx without lesions or exudates.  Teeth in good repair. Neck:  supple, full ROM, no masses, no thyromegaly, no JVD, and no carotid bruits.   Lungs:  normal respiratory effort, no intercostal retractions, no accessory muscle use, normal breath sounds, no dullness, no fremitus, no crackles, and no wheezes.   Heart:  normal rate, regular rhythm, no murmur, no gallop, no rub, and no JVD.   Abdomen:  soft, non-tender, normal bowel sounds, no distention, no masses, no guarding, no rigidity, no rebound tenderness, no abdominal hernia, no inguinal hernia, no hepatomegaly, and no splenomegaly.   Rectal:  No external abnormalities noted. Normal sphincter tone. No rectal masses or tenderness. heme negative stool. Msk:  normal ROM, no joint tenderness, no joint swelling, no joint warmth, and no redness over joints.   Pulses:  R and L carotid,radial,femoral,dorsalis pedis and posterior tibial pulses are full and equal bilaterally Extremities:  trace left pedal edema and trace right pedal edema.   Neurologic:  alert & oriented X3, cranial nerves II-XII intact, strength normal in all extremities, sensation intact to light touch, sensation intact to pinprick, gait normal, and DTRs symmetrical and normal.   Skin:  turgor normal, color normal, no rashes, no suspicious lesions, no ecchymoses, no petechiae, and no purpura.    Psych:  Cognition and judgment appear intact. Alert and cooperative with normal attention span and concentration. No apparent delusions, illusions, hallucinations  Diabetes Management Exam:    Foot Exam (with socks and/or shoes not present):       Sensory-Pinprick/Light touch:          Left medial foot (L-4): normal          Left dorsal foot (L-5): normal  Left lateral foot (S-1): normal          Right medial foot (L-4): normal          Right dorsal foot (L-5): normal          Right lateral foot (S-1): normal       Sensory-Monofilament:          Left foot: normal          Right foot: normal       Inspection:          Left foot: normal          Right foot: normal       Nails:          Left foot: normal          Right foot: normal   Impression & Recommendations:  Problem # 1:  GERD (ICD-530.81) Assessment New  The following medications were removed from the medication list:    Omeprazole 20 Mg Cpdr (Omeprazole) .Marland Kitchen... 1 by mouth once daily x 7 days, then as needed for reflux His updated medication list for this problem includes:    Dexilant 60 Mg Cpdr (Dexlansoprazole) ..... One by mouth once daily for heartburn  Labs Reviewed: Hgb: 14.2 (01/06/2010)   Hct: 43.3 (01/06/2010)  Problem # 2:  AORTIC STENOSIS (ICD-424.1) Assessment: Unchanged  His updated medication list for this problem includes:    Tenoretic 100 100-25 Mg Tabs (Atenolol-chlorthalidone) .Marland Kitchen... 1 by mouth qd    Baby Aspirin 81 Mg Chew (Aspirin) ..... Once daily  Problem # 3:  HYPERTENSION (ICD-401.9) Assessment: Unchanged  His updated medication list for this problem includes:    Tenoretic 100 100-25 Mg Tabs (Atenolol-chlorthalidone) .Marland Kitchen... 1 by mouth qd    Losartan Potassium 100 Mg Tabs (Losartan potassium) ..... Once daily for high blood pressure, this replaces micardis  BP today: 140/80 Prior BP: 132/80 (06/09/2010)  Prior 10 Yr Risk Heart Disease: N/A (02/20/2009)  Labs Reviewed: K+: 4.1  (05/08/2010) Creat: : 1.2 (05/08/2010)   Chol: 192 (06/13/2009)   HDL: 49.50 (06/13/2009)   LDL: DEL (12/03/2008)   TG: 214.0 (06/13/2009)  Problem # 4:  DIABETES MELLITUS, TYPE II (ICD-250.00) Assessment: Unchanged  His updated medication list for this problem includes:    Baby Aspirin 81 Mg Chew (Aspirin) ..... Once daily    Glimepiride 2 Mg Tabs (Glimepiride) ..... Once daily    Losartan Potassium 100 Mg Tabs (Losartan potassium) ..... Once daily for high blood pressure, this replaces micardis  Labs Reviewed: Creat: 1.2 (05/08/2010)     Last Eye Exam: normal (04/30/2010) Reviewed HgBA1c results: 6.5 (05/08/2010)  6.5 (01/06/2010)  Complete Medication List: 1)  Lovastatin 40 Mg Tabs (Lovastatin) .... Once daily 2)  Tenoretic 100 100-25 Mg Tabs (Atenolol-chlorthalidone) .Marland Kitchen.. 1 by mouth qd 3)  Baby Aspirin 81 Mg Chew (Aspirin) .... Once daily 4)  Celexa 10 Mg Tabs (Citalopram hydrobromide) .Marland Kitchen.. 1 by mouth qd 5)  Vitamin D3 1000 Unit Tabs (Cholecalciferol) .Marland Kitchen.. 1 qd 6)  Glimepiride 2 Mg Tabs (Glimepiride) .... Once daily 7)  Contour Blood Glucose System W/device Kit (Blood glucose monitoring suppl) .... Use two times a day as directed 8)  Bayer Contour Test Strp (Glucose blood) .... Use two times a day as directed 9)  Losartan Potassium 100 Mg Tabs (Losartan potassium) .... Once daily for high blood pressure, this replaces micardis 10)  Nasonex 50 Mcg/act Susp (Mometasone furoate) .Marland Kitchen.. 1 spray each nostril every morning 11)  Dexilant 60 Mg  Cpdr (Dexlansoprazole) .... One by mouth once daily for heartburn  Lipid Assessment/Plan:      Based on NCEP/ATP III, the patient's risk factor category is "history of coronary disease, peripheral vascular disease, cerebrovascular disease, or aortic aneurysm along with either diabetes, current smoker, or LDL > 130 plus HDL < 40 plus triglycerides > 200".  The patient's lipid goals are as follows: Total cholesterol goal is 200; LDL cholesterol goal is  70; HDL cholesterol goal is 40; Triglyceride goal is 150.    Patient Instructions: 1)  Please schedule a follow-up appointment in 4 months. 2)  Avoid foods high in acid (tomatoes, citrus juices, spicy foods). Avoid eating within two hours of lying down or before exercising. Do not over eat; try smaller more frequent meals. Elevate head of bed twelve inches when sleeping. 3)  It is important that you exercise regularly at least 20 minutes 5 times a week. If you develop chest pain, have severe difficulty breathing, or feel very tired , stop exercising immediately and seek medical attention. 4)  You need to lose weight. Consider a lower calorie diet and regular exercise.  Prescriptions: DEXILANT 60 MG CPDR (DEXLANSOPRAZOLE) One by mouth once daily for heartburn  #30 x 0   Entered and Authorized by:   [name] MD   Signed by:   [name] MD on 07/22/2010   Method used:   Samples Given   RxID:   [id number]

## 2010-12-30 NOTE — Progress Notes (Signed)
  Phone Note Refill Request Message from:  Fax from Pharmacy on Apr 03, 2010 1:50 PM  Refills Requested: Medication #1:  TENORETIC 100 100-25 MG  TABS 1 by mouth qd Initial call taken by: Rock Nephew CMA,  Apr 03, 2010 1:50 PM

## 2010-12-30 NOTE — Progress Notes (Signed)
  Phone Note Refill Request Message from:  Fax from Pharmacy on February 27, 2010 10:45 AM  Refills Requested: Medication #1:  GLIMEPIRIDE 2 MG TABS once daily   Last Refilled: 01/27/2010 Initial call taken by: Rock Nephew CMA,  February 27, 2010 10:45 AM    Prescriptions: GLIMEPIRIDE 2 MG TABS (GLIMEPIRIDE) once daily  #30 x 12   Entered by:   Rock Nephew CMA   Authorized by:   [name] MD   Signed by:   Rock Nephew CMA on 02/27/2010   Method used:   Electronically to        [facility]* (retail)       [address]       [place], [place]  [id number]       Ph: [phone]       Fax: [phone]   RxID:   [id number]

## 2010-12-30 NOTE — Assessment & Plan Note (Signed)
Summary: dr tlj pt/no slot--cough---stc   Vital Signs:  Patient profile:   70 year old male Height:      71 inches (180.34 cm) Weight:      251 pounds (114.09 kg) O2 Sat:      96 % on Room air Temp:     98.8 degrees F (37.11 degrees C) oral Pulse rate:   63 / minute BP sitting:   132 / 80  (left arm) Cuff size:   large  Vitals Entered By: Orlan Leavens (June 09, 2010 10:00 AM)  O2 Flow:  Room air CC: cough, Cough Is Patient Diabetic? Yes Did you bring your meter with you today? No Pain Assessment Patient in pain? no      Comments Pt states sxs started last Tuesday cough, sore throat. Cough getting worse   Primary Care Provider:  [name] MD  CC:  cough and Cough.  History of Present Illness:  Cough      This is a 70 year old man who presents with Cough.  The symptoms began 6 days ago.  The intensity is described as moderate.  The patient reports non-productive cough, shortness of breath, and exertional dyspnea, but denies pleuritic chest pain, wheezing, fever, and hemoptysis.  Associated symtpoms include sore throat.  The patient denies the following symptoms: cold/URI symptoms, nasal congestion, weight loss, acid reflux symptoms, and peripheral edema.  The cough is worse with lying down.  Ineffective prior treatments have included throat lozenges.  Risk factors include history of allergic rhinitis.    Current Medications (verified): 1)  Lovastatin 40 Mg  Tabs (Lovastatin) .... Once Daily 2)  Tenoretic 100 100-25 Mg  Tabs (Atenolol-Chlorthalidone) .Marland Kitchen.. 1 By Mouth Qd 3)  Baby Aspirin 81 Mg  Chew (Aspirin) .... Once Daily 4)  Celexa 10 Mg  Tabs (Citalopram Hydrobromide) .Marland Kitchen.. 1 By Mouth Qd 5)  Vitamin D3 1000 Unit  Tabs (Cholecalciferol) .Marland Kitchen.. 1 Qd 6)  Glimepiride 2 Mg Tabs (Glimepiride) .... Once Daily 7)  Contour Blood Glucose System W/device Kit (Blood Glucose Monitoring Suppl) .... Use Two Times A Day As Directed 8)  Bayer Contour Test  Strp (Glucose Blood) .... Use  Two Times A Day As Directed 9)  Losartan Potassium 100 Mg Tabs (Losartan Potassium) .... Once Daily For High Blood Pressure, This Replaces Micardis  Allergies (verified): 1)  ! Lipitor (Atorvastatin Calcium) 2)  Lotensin  Past History:  Past Medical History: Hyperlipidemia Hypertension Diabetes mellitus, type II Hypogonadism Pancreatitis 2008  Renal insufficiency  Review of Systems  The patient denies chest pain, syncope, headaches, and abdominal pain.    Physical Exam  General:  alert, well-developed, well-nourished, and cooperative to examination.   nontoxic Lungs:  Normal respiratory effort, chest expands symmetrically. Lungs are clear to auscultation, no crackles or wheezes. few scattered rhonchi Heart:  Grade  1/6 systolic ejection murmur, heard best over RUSB and LLSB.  regular rhythm and no gallop.     Impression & Recommendations:  Problem # 1:  ACUTE BRONCHITIS (ICD-466.0)  His updated medication list for this problem includes:    Tessalon 200 Mg Caps (Benzonatate) .Marland Kitchen... 1 by mouth three times a day x 5 days, then as needed for cough    Azithromycin 250 Mg Tabs (Azithromycin) .Marland Kitchen... 2 tabs by mouth today, then 1 by mouth daily starting tomorrow    Hydromet 5-1.5 Mg/67ml Syrp (Hydrocodone-homatropine) .Marland KitchenMarland KitchenMarland KitchenMarland Kitchen 10cc by mouth every 6 hours as needed for severe cough symptoms  Take antibiotics and other medications  as directed. Encouraged to push clear liquids, get enough rest, and take acetaminophen as needed. To be seen in 5-7 days if no improvement, sooner if worse.  Orders: Prescription Created Electronically [phone])  Complete Medication List: 1)  Lovastatin 40 Mg Tabs (Lovastatin) .... Once daily 2)  Tenoretic 100 100-25 Mg Tabs (Atenolol-chlorthalidone) .Marland Kitchen.. 1 by mouth qd 3)  Baby Aspirin 81 Mg Chew (Aspirin) .... Once daily 4)  Celexa 10 Mg Tabs (Citalopram hydrobromide) .Marland Kitchen.. 1 by mouth qd 5)  Vitamin D3 1000 Unit Tabs (Cholecalciferol) .Marland Kitchen.. 1 qd 6)  Glimepiride  2 Mg Tabs (Glimepiride) .... Once daily 7)  Contour Blood Glucose System W/device Kit (Blood glucose monitoring suppl) .... Use two times a day as directed 8)  Bayer Contour Test Strp (Glucose blood) .... Use two times a day as directed 9)  Losartan Potassium 100 Mg Tabs (Losartan potassium) .... Once daily for high blood pressure, this replaces micardis 10)  Nasonex 50 Mcg/act Susp (Mometasone furoate) .Marland Kitchen.. 1 spray each nostril every morning 11)  Tessalon 200 Mg Caps (Benzonatate) .Marland Kitchen.. 1 by mouth three times a day x 5 days, then as needed for cough 12)  Azithromycin 250 Mg Tabs (Azithromycin) .... 2 tabs by mouth today, then 1 by mouth daily starting tomorrow 13)  Omeprazole 20 Mg Cpdr (Omeprazole) .Marland Kitchen.. 1 by mouth once daily x 7 days, then as needed for reflux 14)  Hydromet 5-1.5 Mg/67ml Syrp (Hydrocodone-homatropine) .Marland Kitchen.. 10cc by mouth every 6 hours as needed for severe cough symptoms  Patient Instructions: 1)  it was good to see you today. 2)  treat cough as discussed - Zpack, tessalon perles, omeprazole x 7 days and cough syrup for severe night cough symptoms  3)  your prescriptions have been electronically submitted to your pharmacy. Please take as directed. Contact our office if you believe you're having problems with the medication(s).  4)  Get plenty of rest, drink lots of clear liquids, and use Tylenol or Ibuprofen for fever and comfort. Return in 7-10 days if you're not better:sooner if you're feeling worse. Prescriptions: HYDROMET 5-1.5 MG/5ML SYRP (HYDROCODONE-HOMATROPINE) 10cc by mouth every 6 hours as needed for severe cough symptoms  #200cc x 0   Entered and Authorized by:   Newt Lukes MD   Signed by:   Newt Lukes MD on 06/09/2010   Method used:   Print then Give to Patient   RxID:   [id number] OMEPRAZOLE 20 MG CPDR (OMEPRAZOLE) 1 by mouth once daily x 7 days, then as needed for reflux  #30 x 1   Entered and Authorized by:   Newt Lukes MD   Signed  by:   Newt Lukes MD on 06/09/2010   Method used:   Electronically to        [facility]* (retail)       [address]       [place], [place]  [address]       Ph: [phone]       Fax: [phone]   RxID:   [id number] AZITHROMYCIN 250 MG TABS (AZITHROMYCIN) 2 tabs by mouth today, then 1 by mouth daily starting tomorrow  #6 x 0   Entered and Authorized by:   Newt Lukes MD   Signed by:   Newt Lukes MD on 06/09/2010   Method used:   Electronically to        [facility]* (retail)       [address] [address]  [place], [place]  [address]       Ph: [phone]       Fax: [phone]   RxID:   [id number] TESSALON 200 MG CAPS (BENZONATATE) 1 by mouth three times a day x 5 days, then as needed for cough  #30 x 1   Entered and Authorized by:   Newt Lukes MD   Signed by:   Newt Lukes MD on 06/09/2010   Method used:   Electronically to        [facility]* (retail)       [address]       [place], [place]  [id number]       Ph: [phone]       Fax: [phone]   RxID:   [id number]

## 2010-12-30 NOTE — Assessment & Plan Note (Signed)
Summary: 3 MTH FU-STC   Vital Signs:  Patient Profile:   70 Years Old Male Weight:      230 pounds Temp:     97.8 degrees F oral Pulse rate:   45 / minute BP sitting:   139 / 88  (left arm)  Vitals Entered By: Tora Perches (November 01, 2007 8:59 AM)             Is Patient Diabetic? Yes  CBG Result 139     Chief Complaint:  Multiple medical problems or concerns.  History of Present Illness: The patient presents for a follow up of hypertension, diabetes, hyperlipidemia   Current Allergies: ! LIPITOR (ATORVASTATIN CALCIUM) ! ASPIRIN (ASPIRIN) LOTENSIN  Past Medical History:    Hyperlipidemia    Hypertension    Diabetes mellitus, type II    Hypogonadism    Pancreatitis 2008   Family History:    Reviewed history and no changes required:       Family History Hypertension  Social History:    Reviewed history and no changes required:       Retired       Married       Former Smoker 1976       3 dogs   Risk Factors:  Tobacco use:  quit    Physical Exam  General:     Well-developed,well-nourished,in no acute distress; alert,appropriate and cooperative throughout examination Head:     Normocephalic and atraumatic without obvious abnormalities. No apparent alopecia or balding. Ears:     External ear exam shows no significant lesions or deformities.  Otoscopic examination reveals clear canals, tympanic membranes are intact bilaterally without bulging, retraction, inflammation or discharge. Hearing is grossly normal bilaterally. Nose:     External nasal examination shows no deformity or inflammation. Nasal mucosa are pink and moist without lesions or exudates. Mouth:     Oral mucosa and oropharynx without lesions or exudates.  Teeth in good repair. Neck:     No deformities, masses, or tenderness noted. Lungs:     Normal respiratory effort, chest expands symmetrically. Lungs are clear to auscultation, no crackles or wheezes. Heart:     Normal rate and  regular rhythm. S1 and S2 normal without gallop, murmur, click, rub or other extra sounds. Abdomen:     Bowel sounds positive,abdomen soft and non-tender without masses, organomegaly or hernias noted. Msk:     No deformity or scoliosis noted of thoracic or lumbar spine.      Impression & Recommendations:  Problem # 1:  DIABETES MELLITUS, TYPE II (ICD-250.00) Assessment: Unchanged  His updated medication list for this problem includes:    Metformin Hcl 1000 Mg Tabs (Metformin hcl) .Marland Kitchen..Marland Kitchen Two times a day    Baby Aspirin 81 Mg Chew (Aspirin) ..... Once daily  The following medications were removed from the medication list:    Atacand 32 Mg Tabs (Candesartan cilexetil) .Marland Kitchen... 32-25 once daily    Lotrel 5-10 Mg Caps (Amlodipine besy-benazepril hcl) ..... Once daily  His updated medication list for this problem includes:    Metformin Hcl 1000 Mg Tabs (Metformin hcl) .Marland Kitchen..Marland Kitchen Two times a day    Baby Aspirin 81 Mg Chew (Aspirin) ..... Once daily   Problem # 2:  HYPERTENSION (ICD-401.9) Assessment: Improved   His updated medication list for this problem includes:    Clonidine Hcl 0.3 Mg Tabs (Clonidine hcl) .Marland Kitchen..Marland Kitchen Two times a day    Tenoretic 100 100-25 Mg Tabs (Atenolol-chlorthalidone) .Marland Kitchen... 1 by  mouth qd  The following medications were removed from the medication list:    Atacand 32 Mg Tabs (Candesartan cilexetil) .Marland Kitchen... 32-25 once daily    Lotrel 5-10 Mg Caps (Amlodipine besy-benazepril hcl) ..... Once daily    Toprol Xl 100 Mg Tb24 (Metoprolol succinate) ..... Once daily  His updated medication list for this problem includes:    Clonidine Hcl 0.3 Mg Tabs (Clonidine hcl) .Marland Kitchen..Marland Kitchen Two times a day    Tenoretic 100 100-25 Mg Tabs (Atenolol-chlorthalidone) .Marland Kitchen... 1 by mouth qd   Problem # 3:  HYPERLIPIDEMIA (ICD-272.4)  His updated medication list for this problem includes:    Lovastatin 40 Mg Tabs (Lovastatin) ..... Once daily - restart  His updated medication list for this problem  includes:    Lovastatin 40 Mg Tabs (Lovastatin) ..... Once daily   Complete Medication List: 1)  Clonidine Hcl 0.3 Mg Tabs (Clonidine hcl) .... Two times a day 2)  Lovastatin 40 Mg Tabs (Lovastatin) .... Once daily 3)  Metformin Hcl 1000 Mg Tabs (Metformin hcl) .... Two times a day 4)  Tenoretic 100 100-25 Mg Tabs (Atenolol-chlorthalidone) .Marland Kitchen.. 1 by mouth qd 5)  Baby Aspirin 81 Mg Chew (Aspirin) .... Once daily 6)  Celexa 10 Mg Tabs (Citalopram hydrobromide) .Marland Kitchen.. 1 by mouth qd 7)  Cialis 20 Mg Tabs (Tadalafil) .... As needed 8)  Vitamin D3 1000 Unit Tabs (Cholecalciferol) .Marland Kitchen.. 1 qd   Patient Instructions: 1)  Please schedule a follow-up appointment in 3 months. 2)  BMP prior to visit, ICD-9: 3)  Hepatic Panel prior to visit, ICD-9: 4)  Lipid Panel prior to visit, ICD-9: 5)  TSH prior to visit, ICD-9:  250.00 995.2  272.0 6)  HbgA1C prior to visit, ICD-9:    Prescriptions: CELEXA 10 MG  TABS (CITALOPRAM HYDROBROMIDE) 1 by mouth qd  #30 x 6   Entered and Authorized by:   Tresa Garter MD   Signed by:   Tresa Garter MD on 11/01/2007   Method used:   Print then Give to Patient   RxID:   [id number]  ]

## 2010-12-30 NOTE — Assessment & Plan Note (Signed)
Summary: yearly f/u, will come fasting for labs after/#/cd   Vital Signs:  Patient profile:   70 year old male Height:      71 inches Weight:      247 pounds BMI:     34.57 O2 Sat:      96 % on Room air Temp:     98.2 degrees F oral Pulse rate:   62 / minute Pulse rhythm:   regular Resp:     16 per minute BP sitting:   130 / 70  (left arm) Cuff size:   large  Vitals Entered By: Rock Nephew CMA (September 08, 2010 9:02 AM)  Nutrition Counseling: Patient's BMI is greater than 25 and therefore counseled on weight management options.  O2 Flow:  Room air CC: Patient here for CPX w/ labs, Hypertension Management,  Lipid Management Is Patient Diabetic? Yes Did you bring your meter with you today? No Pain Assessment Patient in pain? no       Does patient need assistance? Functional Status Self care Ambulation Normal  Vision Screening:Left eye w/o correction: 20 / 25 Right Eye w/o correction: 20 / 25 Both eyes w/o correction:  20/ 25  Color vision testing: normal       Primary Care Provider:  [name] MD  CC:  Patient here for CPX w/ labs, Hypertension Management, and Lipid Management.  History of Present Illness: Here for Medicare AWV:  1.   Risk factors based on Past M, S, F history: yes, see notes 2.   Physical Activities: needs to be more active to lose weight 3.   Depression/mood: mood is good 4.   Hearing: he hears whispered voice at 3 feet 5.   ADL's: does all of his own ADL's 6.   Fall Risk: none noted 7.   Home Safety: very good 8.   Height, weight, &visual acuity: yes , done 9.   Counseling: done 10.   Labs ordered based on risk factors: yes 11.           Referral Coordination: yes ,done 12.           Care Plan: see notes 13.            Cognitive Assessment : he answers all questions appropriately  Dyspepsia History:      He has no alarm features of dyspepsia including no history of melena, hematochezia, dysphagia, persistent vomiting, or  involuntary weight loss > 5%.  There is a prior history of GERD.  The patient does not have a prior history of documented ulcer disease.  The dominant symptom is heartburn or acid reflux.  An H-2 blocker medication is currently being taken.  He notes that the symptoms have improved with the H-2 blocker therapy.  Symptoms have not persisted after 4 weeks of H-2 blocker treatment.  No previous upper endoscopy has been done.    Hypertension History:      He denies headache, chest pain, palpitations, dyspnea with exertion, orthopnea, PND, peripheral edema, visual symptoms, neurologic problems, syncope, and side effects from treatment.  He notes no problems with any antihypertensive medication side effects.        Positive major cardiovascular risk factors include male age 73 years old or older, diabetes, hyperlipidemia, and hypertension.  Negative major cardiovascular risk factors include negative family history for ischemic heart disease and non-tobacco-user status.        Positive history for target organ damage include ASHD (either angina/prior MI/prior CABG) and  peripheral vascular disease.  Further assessment for target organ damage reveals no history of cardiac end-organ damage (CHF/LVH), stroke/TIA, renal insufficiency, or hypertensive retinopathy.    Lipid Management History:      Positive NCEP/ATP III risk factors include male age 57 years old or older, diabetes, hypertension, ASHD (either angina/prior MI/prior CABG), and peripheral vascular disease.  Negative NCEP/ATP III risk factors include no family history for ischemic heart disease, non-tobacco-user status, no prior stroke/TIA, and no history of aortic aneurysm.        The patient states that he knows about the "Therapeutic Lifestyle Change" diet.  His compliance with the TLC diet is fair.  The patient expresses understanding of adjunctive measures for cholesterol lowering.  Adjunctive measures started by the patient include aerobic exercise,  fiber, ASA, and limit alcohol consumpton.  He expresses no side effects from his lipid-lowering medication.  The patient denies any symptoms to suggest myopathy or liver disease.       Current Medications (verified): 1)  Lovastatin 40 Mg  Tabs (Lovastatin) .... Once Daily 2)  Tenoretic 100 100-25 Mg  Tabs (Atenolol-Chlorthalidone) .Marland Kitchen.. 1 By Mouth Qd 3)  Baby Aspirin 81 Mg  Chew (Aspirin) .... Once Daily 4)  Celexa 10 Mg  Tabs (Citalopram Hydrobromide) .Marland Kitchen.. 1 By Mouth Qd 5)  Vitamin D3 1000 Unit  Tabs (Cholecalciferol) .Marland Kitchen.. 1 Qd 6)  Glimepiride 2 Mg Tabs (Glimepiride) .... Once Daily 7)  Contour Blood Glucose System W/device Kit (Blood Glucose Monitoring Suppl) .... Use Two Times A Day As Directed 8)  Bayer Contour Test  Strp (Glucose Blood) .... Use Two Times A Day As Directed 9)  Losartan Potassium 100 Mg Tabs (Losartan Potassium) .... Once Daily For High Blood Pressure, This Replaces Micardis 10)  Nasonex 50 Mcg/act Susp (Mometasone Furoate) .Marland Kitchen.. 1 Spray Each Nostril Every Morning 11)  Dexilant 60 Mg Cpdr (Dexlansoprazole) .... One By Mouth Once Daily For Heartburn 12)  Levbid 0.375 Mg Xr12h-Tab (Hyoscyamine Sulfate) .... One By Mouth Two Times A Day As Needed For Heartburn  Allergies (verified): 1)  ! Lipitor (Atorvastatin Calcium) 2)  Lotensin  Past History:  Past Medical History: Last updated: 06/09/2010 Hyperlipidemia Hypertension Diabetes mellitus, type II Hypogonadism Pancreatitis 2008  Renal insufficiency  Past Surgical History: Last updated: 02/15/2009 Denies surgical history  Family History: Last updated: 11/01/2007 Family History Hypertension  Social History: Last updated: 02/25/2009 Retired Married Former Smoker 1976 3 dogs Drug use-no Regular exercise-yes Alcohol use-yes  Risk Factors: Alcohol Use: 2 (08/08/2010) >5 drinks/d w/in last 3 months: no (08/08/2010) Exercise: yes (02/15/2009)  Risk Factors: Smoking Status: never (08/08/2010) Passive  Smoke Exposure: no (08/08/2010)  Family History: Reviewed history from 11/01/2007 and no changes required. Family History Hypertension  Social History: Reviewed history from 02/25/2009 and no changes required. Retired Married Former Smoker 1976 3 dogs Drug use-no Regular exercise-yes Alcohol use-yes  Review of Systems       The patient complains of weight gain.  The patient denies anorexia, fever, weight loss, chest pain, syncope, dyspnea on exertion, peripheral edema, prolonged cough, headaches, hemoptysis, abdominal pain, melena, hematochezia, severe indigestion/heartburn, hematuria, suspicious skin lesions, difficulty walking, depression, enlarged lymph nodes, angioedema, and testicular masses.   Endo:  Denies cold intolerance, excessive hunger, excessive thirst, excessive urination, heat intolerance, polyuria, and weight change.  Physical Exam  General:  alert, well-developed, well-nourished, well-hydrated, appropriate dress, normal appearance, healthy-appearing, cooperative to examination, good hygiene, and overweight-appearing.   Head:  Normocephalic and atraumatic without obvious abnormalities. No apparent  alopecia or balding. Eyes:  No corneal or conjunctival inflammation noted. EOMI. Perrla. Funduscopic exam benign, without hemorrhages, exudates or papilledema. Vision grossly normal. Mouth:  Oral mucosa and oropharynx without lesions or exudates.  Teeth in good repair. Neck:  supple, full ROM, no masses, no thyromegaly, no thyroid nodules or tenderness, no JVD, normal carotid upstroke, no carotid bruits, no cervical lymphadenopathy, and no neck tenderness.   Chest Wall:  No deformities, masses, tenderness or gynecomastia noted. Breasts:  No masses or gynecomastia noted Lungs:  Normal respiratory effort, chest expands symmetrically. Lungs are clear to auscultation, no crackles or wheezes. Heart:  normal rate, regular rhythm, no gallop, no rub, and Grade  1/6 systolic ejection  murmur over RUSB with radiation everywhere. no jvd. Abdomen:  Bowel sounds positive,abdomen soft and non-tender without masses, organomegaly or hernias noted. Rectal:  No external abnormalities noted. Normal sphincter tone. No rectal masses or tenderness. Genitalia:  circumcised, no hydrocele, no varicocele, no scrotal masses, no testicular masses or atrophy, no cutaneous lesions, and no urethral discharge.   Prostate:  no nodules, no asymmetry, no induration, and 1+ enlarged.   Msk:  No deformity or scoliosis noted of thoracic or lumbar spine.   Pulses:  R and L carotid,radial,femoral,dorsalis pedis and posterior tibial pulses are full and equal bilaterally Extremities:  trace left pedal edema and trace right pedal edema.   Neurologic:  No cranial nerve deficits noted. Station and gait are normal. Plantar reflexes are down-going bilaterally. DTRs are symmetrical throughout. Sensory, motor and coordinative functions appear intact. Skin:  Intact without suspicious lesions or rashes Cervical Nodes:  no anterior cervical adenopathy and no posterior cervical adenopathy.   Axillary Nodes:  no R axillary adenopathy and no L axillary adenopathy.   Inguinal Nodes:  no R inguinal adenopathy and no L inguinal adenopathy.   Psych:  Cognition and judgment appear intact. Alert and cooperative with normal attention span and concentration. No apparent delusions, illusions, hallucinations  Diabetes Management Exam:    Foot Exam (with socks and/or shoes not present):       Sensory-Pinprick/Light touch:          Left medial foot (L-4): normal          Left dorsal foot (L-5): normal          Left lateral foot (S-1): normal          Right medial foot (L-4): normal          Right dorsal foot (L-5): normal          Right lateral foot (S-1): normal       Sensory-Monofilament:          Left foot: normal          Right foot: normal       Inspection:          Left foot: normal          Right foot: normal        Nails:          Left foot: normal          Right foot: normal   Impression & Recommendations:  Problem # 1:  GERD (ICD-530.81) Assessment Improved  His updated medication list for this problem includes:    Dexilant 60 Mg Cpdr (Dexlansoprazole) ..... One by mouth once daily for heartburn    Levbid 0.375 Mg Xr12h-tab (Hyoscyamine sulfate) ..... One by mouth two times a day as needed for heartburn  Orders: Venipuncture (  16109) TLB-Lipid Panel (80061-LIPID) TLB-BMP (Basic Metabolic Panel-BMET) (80048-METABOL) TLB-CBC Platelet - w/Differential (85025-CBCD) TLB-Hepatic/Liver Function Pnl (80076-HEPATIC) TLB-TSH (Thyroid Stimulating Hormone) (84443-TSH) TLB-A1C / Hgb A1C (Glycohemoglobin) (83036-A1C) TLB-PSA (Prostate Specific Antigen) (84153-PSA) TLB-Udip w/ Micro (81001-URINE) Hemoccult Guaiac-1 spec.(in office) (82270)  Problem # 2:  HYPERTROPHY PROSTATE W/UR OBST & OTH LUTS (ICD-600.01) Assessment: Improved  Orders: Venipuncture (60454) TLB-Lipid Panel (80061-LIPID) TLB-BMP (Basic Metabolic Panel-BMET) (80048-METABOL) TLB-CBC Platelet - w/Differential (85025-CBCD) TLB-Hepatic/Liver Function Pnl (80076-HEPATIC) TLB-TSH (Thyroid Stimulating Hormone) (84443-TSH) TLB-A1C / Hgb A1C (Glycohemoglobin) (83036-A1C) TLB-PSA (Prostate Specific Antigen) (84153-PSA) TLB-Udip w/ Micro (81001-URINE)  Problem # 3:  AORTIC STENOSIS (ICD-424.1) Assessment: Unchanged  His updated medication list for this problem includes:    Tenoretic 100 100-25 Mg Tabs (Atenolol-chlorthalidone) .Marland Kitchen... 1 by mouth qd    Baby Aspirin 81 Mg Chew (Aspirin) ..... Once daily  Problem # 4:  RENAL INSUFFICIENCY (ICD-588.9) Assessment: Unchanged  Orders: Venipuncture (09811) TLB-Lipid Panel (80061-LIPID) TLB-BMP (Basic Metabolic Panel-BMET) (80048-METABOL) TLB-CBC Platelet - w/Differential (85025-CBCD) TLB-Hepatic/Liver Function Pnl (80076-HEPATIC) TLB-TSH (Thyroid Stimulating Hormone) (84443-TSH) TLB-A1C /  Hgb A1C (Glycohemoglobin) (83036-A1C) TLB-PSA (Prostate Specific Antigen) (84153-PSA) TLB-Udip w/ Micro (81001-URINE)  Problem # 5:  DIABETES MELLITUS, TYPE II (ICD-250.00) Assessment: Improved  His updated medication list for this problem includes:    Baby Aspirin 81 Mg Chew (Aspirin) ..... Once daily    Glimepiride 2 Mg Tabs (Glimepiride) ..... Once daily    Losartan Potassium 100 Mg Tabs (Losartan potassium) ..... Once daily for high blood pressure, this replaces micardis  Orders: Venipuncture (91478) TLB-Lipid Panel (80061-LIPID) TLB-BMP (Basic Metabolic Panel-BMET) (80048-METABOL) TLB-CBC Platelet - w/Differential (85025-CBCD) TLB-Hepatic/Liver Function Pnl (80076-HEPATIC) TLB-TSH (Thyroid Stimulating Hormone) (84443-TSH) TLB-A1C / Hgb A1C (Glycohemoglobin) (83036-A1C) TLB-PSA (Prostate Specific Antigen) (84153-PSA) TLB-Udip w/ Micro (81001-URINE)  Labs Reviewed: Creat: 1.2 (05/08/2010)     Last Eye Exam: normal (04/30/2010) Reviewed HgBA1c results: 6.5 (05/08/2010)  6.5 (01/06/2010)  Problem # 6:  Preventive Health Care (ICD-V70.0) Assessment: New  Colonoscopy: DONE (10/01/2009) Flu Vax: Fluvax 3+ (09/08/2010)   Pneumovax: Pneumovax (06/11/2006) Chol: 192 (06/13/2009)   HDL: 49.50 (06/13/2009)   LDL: DEL (12/03/2008)   TG: 214.0 (06/13/2009) TSH: 2.30 (06/13/2009)   HgbA1C: 6.5 (05/08/2010)   PSA: 0.78 (09/02/2009) Next Colonoscopy due:: 10/2019 (10/01/2009)  Discussed using sunscreen, use of alcohol, drug use, self testicular exam, routine dental care, routine eye care, routine physical exam, seat belts, multiple vitamins,  and recommendations for immunizations.  Discussed exercise and checking cholesterol.  Also recommend checking PSA.  Complete Medication List: 1)  Lovastatin 40 Mg Tabs (Lovastatin) .... Once daily 2)  Tenoretic 100 100-25 Mg Tabs (Atenolol-chlorthalidone) .Marland Kitchen.. 1 by mouth qd 3)  Baby Aspirin 81 Mg Chew (Aspirin) .... Once daily 4)  Celexa 10 Mg  Tabs (Citalopram hydrobromide) .Marland Kitchen.. 1 by mouth qd 5)  Vitamin D3 1000 Unit Tabs (Cholecalciferol) .Marland Kitchen.. 1 qd 6)  Glimepiride 2 Mg Tabs (Glimepiride) .... Once daily 7)  Contour Blood Glucose System W/device Kit (Blood glucose monitoring suppl) .... Use two times a day as directed 8)  Bayer Contour Test Strp (Glucose blood) .... Use two times a day as directed 9)  Losartan Potassium 100 Mg Tabs (Losartan potassium) .... Once daily for high blood pressure, this replaces micardis 10)  Nasonex 50 Mcg/act Susp (Mometasone furoate) .Marland Kitchen.. 1 spray each nostril every morning 11)  Dexilant 60 Mg Cpdr (Dexlansoprazole) .... One by mouth once daily for heartburn 12)  Levbid 0.375 Mg Xr12h-tab (Hyoscyamine sulfate) .... One by mouth two times a day  as needed for heartburn  Other Orders: Flu Vaccine 19yrs + MEDICARE PATIENTS (Z6109) Administration Flu vaccine - MCR (G0008) [facility] -Subsequent Annual Wellness Visit [phone])  Hypertension Assessment/Plan:      The patient's hypertensive risk group is category C: Target organ damage and/or diabetes.  Today's blood pressure is 130/70.  His blood pressure goal is < 130/80.  Lipid Assessment/Plan:      Based on NCEP/ATP III, the patient's risk factor category is "history of coronary disease, peripheral vascular disease, cerebrovascular disease, or aortic aneurysm along with either diabetes, current smoker, or LDL > 130 plus HDL < 40 plus triglycerides > 200".  The patient's lipid goals are as follows: Total cholesterol goal is 200; LDL cholesterol goal is 70; HDL cholesterol goal is 40; Triglyceride goal is 150.    Colorectal Screening:  Current Recommendations:    Hemoccult: NEG X 1 today  PSA Screening:    PSA: 0.78  (09/02/2009)    Reviewed PSA screening recommendations: PSA ordered  Immunization & Chemoprophylaxis:    Influenza vaccine: Fluvax 3+  (09/08/2010)    Pneumovax: Pneumovax  (06/11/2006)  Patient Instructions: 1)  Please schedule a follow-up  appointment in 4 months. 2)  It is important that you exercise regularly at least 20 minutes 5 times a week. If you develop chest pain, have severe difficulty breathing, or feel very tired , stop exercising immediately and seek medical attention. 3)  You need to lose weight. Consider a lower calorie diet and regular exercise.  4)  Check your blood sugars regularly. If your readings are usually above 200 or below 70 you should contact our office. 5)  It is important that your Diabetic A1c level is checked every 3 months. 6)  See your eye doctor yearly to check for diabetic eye damage. 7)  Check your feet each night for sore areas, calluses or signs of infection. 8)  Check your Blood Pressure regularly. If it is above 130/80: you should make an appointment. Marland [email or web address] Flu Vaccine Consent Questions     Do you have a history of severe allergic reactions to this vaccine? no    Any prior history of allergic reactions to egg and/or gelatin? no    Do you have a sensitivity to the preservative Thimersol? no    Do you have a past history of Guillan-Barre Syndrome? no    Do you currently have an acute febrile illness? no    Have you ever had a severe reaction to latex? no    Vaccine information given and explained to patient? yes    Are you currently pregnant? no    Lot Number:[id number]   Exp Date:05/30/2011   Site Given  Left Deltoid IM  Tetanus Vaccine (to be given today)

## 2010-12-30 NOTE — Progress Notes (Signed)
  Phone Note Refill Request Message from:  Patient on September 03, 2010 4:32 PM  Refills Requested: Medication #1:  LOSARTAN POTASSIUM 100 MG TABS once daily for high blood pressure   Supply Requested: 1 month Initial call taken by: Rock Nephew CMA,  September 03, 2010 4:32 PM    Prescriptions: LOSARTAN POTASSIUM 100 MG TABS (LOSARTAN POTASSIUM) once daily for high blood pressure, this replaces Micardis  #30 x 11   Entered by:   Rock Nephew CMA   Authorized by:   [name] MD   Signed by:   Rock Nephew CMA on 09/03/2010   Method used:   Electronically to        [facility] (retail)       [address]       [place], [place]  [id number]       Ph: [phone]       Fax: [phone]   RxID:   [id number]

## 2010-12-30 NOTE — Miscellaneous (Signed)
Summary: Appointment Canceled  Appointment status changed to canceled by [employer] on 07/31/2010 2:27 PM.  Cancellation Comments --------------------- ECHO/ DX: AORTIC STENOSIS/ PT HAS MEDICARE, AARP . GD  Appointment Information ----------------------- Appt Type:  CARDIOLOGY ANCILLARY VISIT      Date:  Tuesday, August 05, 2010      Time:  5:30 PM for 60 min   Urgency:  Routine   Made By:  Hoy Finlay Scheduler  To Visit:  [id number]    Reason:  ECHO/ DX: AORTIC STENOSIS/ PT HAS MEDICARE, AARP . GD  Appt Comments ------------- -- 07/31/10 14:27: ([facility]) CANCELED -- ECHO/ DX: AORTIC STENOSIS/ PT HAS MEDICARE, AARP . GD -- 07/30/10 9:42: ([facility]) BOOKED -- Routine CARDIOLOGY ANCILLARY VISIT at 08/05/2010 5:30 PM for 60 min ECHO/ DX: AORTIC STENOSIS/ PT HAS MEDICARE, AARP . GD -- 8

## 2010-12-30 NOTE — Assessment & Plan Note (Signed)
Summary: worsening reflux,req appt asap/cd   Vital Signs:  Patient profile:   70 year old male Height:      71 inches (180.34 cm) Weight:      242.50 pounds (110.23 kg) BMI:     33.94 O2 Sat:      95 % on Room air Temp:     98.5 degrees F (36.94 degrees C) oral Pulse rate:   76 / minute Resp:     16 per minute BP sitting:   140 / 90  (left arm) Cuff size:   large  Vitals Entered By: Lucious Groves CMA (July 25, 2010 9:29 AM)  Nutrition Counseling: Patient's BMI is greater than 25 and therefore counseled on weight management options.  O2 Flow:  Room air CC: C/O indigestion and worsening reflux./kb, Hypertension Management Is Patient Diabetic? Yes Pain Assessment Patient in pain? no      Comments Per patient, meds are not helping./kb   Primary Care Provider:  [name] MD  CC:  C/O indigestion and worsening reflux./kb and Hypertension Management.  History of Present Illness: He returns for persistnet heartburn- he had a bad episode last night and had to take an extra dose of Dexilant and some Gaviscon to treat the heartburn. Symptoms are worse when he lays down or eats/drinks.  Dyspepsia History:      He has no alarm features of dyspepsia including no history of melena, hematochezia, dysphagia, persistent vomiting, or involuntary weight loss > 5%.  There is a prior history of GERD.  The patient does not have a prior history of documented ulcer disease.  The dominant symptom is heartburn or acid reflux.  An H-2 blocker medication is currently being taken.  He notes that the symptoms have not improved with the H-2 blocker therapy.    Hypertension History:      He denies headache, chest pain, palpitations, dyspnea with exertion, orthopnea, PND, peripheral edema, visual symptoms, neurologic problems, syncope, and side effects from treatment.  He notes no problems with any antihypertensive medication side effects.        Positive major cardiovascular risk factors include male  age 50 years old or older, diabetes, hyperlipidemia, and hypertension.  Negative major cardiovascular risk factors include negative family history for ischemic heart disease and non-tobacco-user status.        Positive history for target organ damage include ASHD (either angina/prior MI/prior CABG) and peripheral vascular disease.  Further assessment for target organ damage reveals no history of cardiac end-organ damage (CHF/LVH), stroke/TIA, renal insufficiency, or hypertensive retinopathy.      Current Medications (verified): 1)  Lovastatin 40 Mg  Tabs (Lovastatin) .... Once Daily 2)  Tenoretic 100 100-25 Mg  Tabs (Atenolol-Chlorthalidone) .Marland Kitchen.. 1 By Mouth Qd 3)  Baby Aspirin 81 Mg  Chew (Aspirin) .... Once Daily 4)  Celexa 10 Mg  Tabs (Citalopram Hydrobromide) .Marland Kitchen.. 1 By Mouth Qd 5)  Vitamin D3 1000 Unit  Tabs (Cholecalciferol) .Marland Kitchen.. 1 Qd 6)  Glimepiride 2 Mg Tabs (Glimepiride) .... Once Daily 7)  Contour Blood Glucose System W/device Kit (Blood Glucose Monitoring Suppl) .... Use Two Times A Day As Directed 8)  Bayer Contour Test  Strp (Glucose Blood) .... Use Two Times A Day As Directed 9)  Losartan Potassium 100 Mg Tabs (Losartan Potassium) .... Once Daily For High Blood Pressure, This Replaces Micardis 10)  Nasonex 50 Mcg/act Susp (Mometasone Furoate) .Marland Kitchen.. 1 Spray Each Nostril Every Morning 11)  Dexilant 60 Mg Cpdr (Dexlansoprazole) .... One By  Mouth Once Daily For Heartburn  Allergies (verified): 1)  ! Lipitor (Atorvastatin Calcium) 2)  Lotensin  Past History:  Past Medical History: Last updated: 06/09/2010 Hyperlipidemia Hypertension Diabetes mellitus, type II Hypogonadism Pancreatitis 2008  Renal insufficiency  Past Surgical History: Last updated: 02/15/2009 Denies surgical history  Family History: Last updated: 11/01/2007 Family History Hypertension  Social History: Last updated: 02/25/2009 Retired Married Former Smoker 1976 3 dogs Drug use-no Regular  exercise-yes Alcohol use-yes  Risk Factors: Alcohol Use: 2 (07/22/2010) >5 drinks/d w/in last 3 months: no (07/22/2010) Exercise: yes (02/15/2009)  Risk Factors: Smoking Status: never (07/22/2010)  Family History: Reviewed history from 11/01/2007 and no changes required. Family History Hypertension  Social History: Reviewed history from 02/25/2009 and no changes required. Retired Married Former Smoker 1976 3 dogs Drug use-no Regular exercise-yes Alcohol use-yes  Review of Systems       The patient complains of weight gain.  The patient denies anorexia, fever, weight loss, chest pain, syncope, dyspnea on exertion, peripheral edema, prolonged cough, headaches, hemoptysis, abdominal pain, melena, hematochezia, severe indigestion/heartburn, hematuria, suspicious skin lesions, difficulty walking, and depression.   GI:  Complains of constipation, indigestion, and nausea; denies abdominal pain, bloody stools, hemorrhoids, loss of appetite, vomiting, vomiting blood, and yellowish skin color.  Physical Exam  General:  alert, well-developed, well-nourished, well-hydrated, appropriate dress, normal appearance, healthy-appearing, cooperative to examination, good hygiene, and overweight-appearing.   Head:  normocephalic, atraumatic, no abnormalities observed, and no abnormalities palpated.   Mouth:  Oral mucosa and oropharynx without lesions or exudates.  Teeth in good repair. Neck:  supple, full ROM, no masses, no thyromegaly, no JVD, and no carotid bruits.   Lungs:  normal respiratory effort, no intercostal retractions, no accessory muscle use, normal breath sounds, no dullness, no fremitus, no crackles, and no wheezes.   Heart:  normal rate, regular rhythm, no gallop, no rub, and Grade  1/6 systolic ejection murmur over RUSB with radiation everywhere. no jvd. Abdomen:  soft, non-tender, normal bowel sounds, no distention, no masses, no guarding, no rigidity, no rebound tenderness, no  abdominal hernia, no inguinal hernia, no hepatomegaly, and no splenomegaly.   Rectal:  No external abnormalities noted. Normal sphincter tone. No rectal masses or tenderness. Prostate:  no nodules, no asymmetry, no induration, and 1+ enlarged.   Msk:  normal ROM, no joint tenderness, no joint swelling, no joint warmth, and no redness over joints.   Pulses:  R and L carotid,radial,femoral,dorsalis pedis and posterior tibial pulses are full and equal bilaterally Extremities:  trace left pedal edema and trace right pedal edema.   Neurologic:  alert & oriented X3, cranial nerves II-XII intact, strength normal in all extremities, sensation intact to light touch, sensation intact to pinprick, gait normal, and DTRs symmetrical and normal.   Skin:  turgor normal, color normal, no rashes, no suspicious lesions, no ecchymoses, no petechiae, and no purpura.   Psych:  Cognition and judgment appear intact. Alert and cooperative with normal attention span and concentration. No apparent delusions, illusions, hallucinations Additional Exam:  His EKG today has no changes from his prior EKG  Diabetes Management Exam:    Foot Exam (with socks and/or shoes not present):       Sensory-Pinprick/Light touch:          Left medial foot (L-4): normal          Left dorsal foot (L-5): normal          Left lateral foot (S-1): normal  Right medial foot (L-4): normal          Right dorsal foot (L-5): normal          Right lateral foot (S-1): normal       Sensory-Monofilament:          Left foot: normal          Right foot: normal       Inspection:          Left foot: normal          Right foot: normal       Nails:          Left foot: normal          Right foot: normal   Impression & Recommendations:  Problem # 1:  GERD (ICD-530.81) Assessment Unchanged  His updated medication list for this problem includes:    Dexilant 60 Mg Cpdr (Dexlansoprazole) ..... One by mouth once daily for heartburn    Levbid  0.375 Mg Xr12h-tab (Hyoscyamine sulfate) ..... One by mouth two times a day as needed for heartburn  Orders: Hemoccult Guaiac-1 spec.(in office) (82270)  Problem # 2:  ELECTROCARDIOGRAM, ABNORMAL (ICD-794.31) Assessment: Unchanged  Problem # 3:  AORTIC STENOSIS (ICD-424.1) Assessment: Unchanged  His updated medication list for this problem includes:    Tenoretic 100 100-25 Mg Tabs (Atenolol-chlorthalidone) .Marland Kitchen... 1 by mouth qd    Baby Aspirin 81 Mg Chew (Aspirin) ..... Once daily  Orders: Echo Referral (Echo) EKG w/ Interpretation (93000)  Complete Medication List: 1)  Lovastatin 40 Mg Tabs (Lovastatin) .... Once daily 2)  Tenoretic 100 100-25 Mg Tabs (Atenolol-chlorthalidone) .Marland Kitchen.. 1 by mouth qd 3)  Baby Aspirin 81 Mg Chew (Aspirin) .... Once daily 4)  Celexa 10 Mg Tabs (Citalopram hydrobromide) .Marland Kitchen.. 1 by mouth qd 5)  Vitamin D3 1000 Unit Tabs (Cholecalciferol) .Marland Kitchen.. 1 qd 6)  Glimepiride 2 Mg Tabs (Glimepiride) .... Once daily 7)  Contour Blood Glucose System W/device Kit (Blood glucose monitoring suppl) .... Use two times a day as directed 8)  Bayer Contour Test Strp (Glucose blood) .... Use two times a day as directed 9)  Losartan Potassium 100 Mg Tabs (Losartan potassium) .... Once daily for high blood pressure, this replaces micardis 10)  Nasonex 50 Mcg/act Susp (Mometasone furoate) .Marland Kitchen.. 1 spray each nostril every morning 11)  Dexilant 60 Mg Cpdr (Dexlansoprazole) .... One by mouth once daily for heartburn 12)  Levbid 0.375 Mg Xr12h-tab (Hyoscyamine sulfate) .... One by mouth two times a day as needed for heartburn  Hypertension Assessment/Plan:      The patient's hypertensive risk group is category C: Target organ damage and/or diabetes.  Today's blood pressure is 140/90.  His blood pressure goal is < 130/80.  Colorectal Screening:  Current Recommendations:    Hemoccult: NEG X 1 today  PSA Screening:    PSA: 0.78  (09/02/2009)  Immunization & Chemoprophylaxis:     Influenza vaccine: Fluvax 3+  (09/02/2009)    Pneumovax: Pneumovax  (06/11/2006)  Patient Instructions: 1)  Please schedule a follow-up appointment in 2 weeks. 2)  Avoid foods high in acid (tomatoes, citrus juices, spicy foods). Avoid eating within two hours of lying down or before exercising. Do not over eat; try smaller more frequent meals. Elevate head of bed twelve inches when sleeping. 3)  It is important that you exercise regularly at least 20 minutes 5 times a week. If you develop chest pain, have severe difficulty breathing, or feel very tired , stop  exercising immediately and seek medical attention. 4)  You need to lose weight. Consider a lower calorie diet and regular exercise.  5)  Check your blood sugars regularly. If your readings are usually above 200 or below 70 you should contact our office. 6)  It is important that your Diabetic A1c level is checked every 3 months. 7)  See your eye doctor yearly to check for diabetic eye damage. 8)  Check your feet each night for sore areas, calluses or signs of infection. 9)  Check your Blood Pressure regularly. If it is above 130/80: you should make an appointment. Prescriptions: LEVBID 0.375 MG XR12H-TAB (HYOSCYAMINE SULFATE) One by mouth two times a day as needed for heartburn  #60 x 3   Entered and Authorized by:   [name] MD   Signed by:   [name] MD on 07/25/2010   Method used:   Electronically to        [facility]* (retail)       [address]       [place], [place]  [id number]       Ph: [phone]       Fax: [phone]   RxID:   [id number]

## 2010-12-30 NOTE — Progress Notes (Signed)
Summary: citalopram  Phone Note Other Incoming Call back at fax   Call placed by: [place] Details for Reason: refill  request  on  citalopram  Details of Action Taken: ok x 5 refills/vg Initial call taken by: Tora Perches,  December 28, 2008 2:38 PM      Prescriptions: CELEXA 10 MG  TABS (CITALOPRAM HYDROBROMIDE) 1 by mouth qd  #30 x 5   Entered by:   Tora Perches   Authorized by:   Tresa Garter MD   Signed by:   Tora Perches on 12/28/2008   Method used:   Electronically to        [facility]* (retail)       [address]       [place], [place]  [id number]       Ph: [phone]       Fax: [phone]   RxID:   [id number]

## 2010-12-30 NOTE — Assessment & Plan Note (Signed)
Summary: per pt 2 wk fu  stc--Rm 8   Vital Signs:  Patient profile:   70 year old male Height:      71 inches Weight:      248 pounds BMI:     34.71 O2 Sat:      98 % on Room air Temp:     97.3 degrees F oral Pulse rate:   62 / minute Pulse rhythm:   regular Resp:     16 per minute BP sitting:   128 / 80  (left arm) Cuff size:   large  Vitals Entered By: Mervin Kung CMA Duncan Dull) (August 08, 2010 8:22 AM)  Nutrition Counseling: Patient's BMI is greater than 25 and therefore counseled on weight management options.  O2 Flow:  Room air CC: Room 8  2 week follow up. Is Patient Diabetic? Yes Pain Assessment Patient in pain? no        Primary Care Provider:  [name] MD  CC:  Room 8  2 week follow up.Marland Kitchen  History of Present Illness:  Follow-Up Visit      This is a 70 year old man who presents for Follow-up visit.  The patient denies chest pain, palpitations, dizziness, syncope, low blood sugar symptoms, high blood sugar symptoms, edema, SOB, DOE, PND, and orthopnea.  Since the last visit the patient notes no new problems or concerns.  The patient reports taking meds as prescribed, monitoring BP, monitoring blood sugars, and dietary noncompliance.  When questioned about possible medication side effects, the patient notes none.    Preventive Screening-Counseling & Management  Alcohol-Tobacco     Alcohol drinks/day: 2     Alcohol type: wine     >5/day in last 3 mos: no     Alcohol Counseling: not indicated; use of alcohol is not excessive or problematic     Feels need to cut down: no     Feels annoyed by complaints: no     Feels guilty re: drinking: no     Needs 'eye opener' in am: no     Smoking Status: never     Passive Smoke Exposure: no     Tobacco Counseling: not indicated; no tobacco use  Hep-HIV-STD-Contraception     Hepatitis Risk: no risk noted     HIV Risk: no risk noted     STD Risk: no risk noted      Sexual History:  currently monogamous.         Drug Use:  never.        Blood Transfusions:  no.    Medications Prior to Update: 1)  Lovastatin 40 Mg  Tabs (Lovastatin) .... Once Daily 2)  Tenoretic 100 100-25 Mg  Tabs (Atenolol-Chlorthalidone) .Marland Kitchen.. 1 By Mouth Qd 3)  Baby Aspirin 81 Mg  Chew (Aspirin) .... Once Daily 4)  Celexa 10 Mg  Tabs (Citalopram Hydrobromide) .Marland Kitchen.. 1 By Mouth Qd 5)  Vitamin D3 1000 Unit  Tabs (Cholecalciferol) .Marland Kitchen.. 1 Qd 6)  Glimepiride 2 Mg Tabs (Glimepiride) .... Once Daily 7)  Contour Blood Glucose System W/device Kit (Blood Glucose Monitoring Suppl) .... Use Two Times A Day As Directed 8)  Bayer Contour Test  Strp (Glucose Blood) .... Use Two Times A Day As Directed 9)  Losartan Potassium 100 Mg Tabs (Losartan Potassium) .... Once Daily For High Blood Pressure, This Replaces Micardis 10)  Nasonex 50 Mcg/act Susp (Mometasone Furoate) .Marland Kitchen.. 1 Spray Each Nostril Every Morning 11)  Dexilant 60 Mg Cpdr (  Dexlansoprazole) .... One By Mouth Once Daily For Heartburn 12)  Levbid 0.375 Mg Xr12h-Tab (Hyoscyamine Sulfate) .... One By Mouth Two Times A Day As Needed For Heartburn  Current Medications (verified): 1)  Lovastatin 40 Mg  Tabs (Lovastatin) .... Once Daily 2)  Tenoretic 100 100-25 Mg  Tabs (Atenolol-Chlorthalidone) .Marland Kitchen.. 1 By Mouth Qd 3)  Baby Aspirin 81 Mg  Chew (Aspirin) .... Once Daily 4)  Celexa 10 Mg  Tabs (Citalopram Hydrobromide) .Marland Kitchen.. 1 By Mouth Qd 5)  Vitamin D3 1000 Unit  Tabs (Cholecalciferol) .Marland Kitchen.. 1 Qd 6)  Glimepiride 2 Mg Tabs (Glimepiride) .... Once Daily 7)  Contour Blood Glucose System W/device Kit (Blood Glucose Monitoring Suppl) .... Use Two Times A Day As Directed 8)  Bayer Contour Test  Strp (Glucose Blood) .... Use Two Times A Day As Directed 9)  Losartan Potassium 100 Mg Tabs (Losartan Potassium) .... Once Daily For High Blood Pressure, This Replaces Micardis 10)  Nasonex 50 Mcg/act Susp (Mometasone Furoate) .Marland Kitchen.. 1 Spray Each Nostril Every Morning 11)  Dexilant 60 Mg Cpdr  (Dexlansoprazole) .... One By Mouth Once Daily For Heartburn 12)  Levbid 0.375 Mg Xr12h-Tab (Hyoscyamine Sulfate) .... One By Mouth Two Times A Day As Needed For Heartburn  Allergies: 1)  ! Lipitor (Atorvastatin Calcium) 2)  Lotensin  Past History:  Past Medical History: Last updated: 06/09/2010 Hyperlipidemia Hypertension Diabetes mellitus, type II Hypogonadism Pancreatitis 2008  Renal insufficiency  Past Surgical History: Last updated: 02/15/2009 Denies surgical history  Family History: Last updated: 11/01/2007 Family History Hypertension  Social History: Last updated: 02/25/2009 Retired Married Former Smoker 1976 3 dogs Drug use-no Regular exercise-yes Alcohol use-yes  Risk Factors: Alcohol Use: 2 (08/08/2010) >5 drinks/d w/in last 3 months: no (08/08/2010) Exercise: yes (02/15/2009)  Risk Factors: Smoking Status: never (08/08/2010) Passive Smoke Exposure: no (08/08/2010)  Family History: Reviewed history from 11/01/2007 and no changes required. Family History Hypertension  Social History: Reviewed history from 02/25/2009 and no changes required. Retired Married Former Smoker 1976 3 dogs Drug use-no Regular exercise-yes Alcohol use-yes Passive Smoke Exposure:  no  Review of Systems       The patient complains of weight gain.  The patient denies anorexia, fever, chest pain, syncope, dyspnea on exertion, peripheral edema, prolonged cough, headaches, abdominal pain, melena, hematochezia, severe indigestion/heartburn, difficulty walking, depression, and enlarged lymph nodes.   CV:  Denies chest pain or discomfort, difficulty breathing at night, fainting, fatigue, leg cramps with exertion, lightheadness, near fainting, palpitations, shortness of breath with exertion, and swelling of feet. Endo:  Denies cold intolerance, excessive hunger, excessive thirst, excessive urination, heat intolerance, polyuria, and weight change.  Physical Exam  General:   alert, well-developed, well-nourished, well-hydrated, appropriate dress, normal appearance, healthy-appearing, cooperative to examination, good hygiene, and overweight-appearing.   Head:  normocephalic, atraumatic, no abnormalities observed, and no abnormalities palpated.   Mouth:  Oral mucosa and oropharynx without lesions or exudates.  Teeth in good repair. Neck:  supple, full ROM, no masses, no thyromegaly, no JVD, and no carotid bruits.   Lungs:  normal respiratory effort, no intercostal retractions, no accessory muscle use, normal breath sounds, no dullness, no fremitus, no crackles, and no wheezes.   Heart:  normal rate, regular rhythm, no gallop, no rub, and Grade  1/6 systolic ejection murmur over RUSB with radiation everywhere. no jvd. Abdomen:  soft, non-tender, normal bowel sounds, no distention, no masses, no guarding, no rigidity, no rebound tenderness, no abdominal hernia, no inguinal hernia, no hepatomegaly, and no  splenomegaly.   Msk:  normal ROM, no joint tenderness, no joint swelling, no joint warmth, and no redness over joints.   Pulses:  R and L carotid,radial,femoral,dorsalis pedis and posterior tibial pulses are full and equal bilaterally Extremities:  trace left pedal edema and trace right pedal edema.   Neurologic:  alert & oriented X3, cranial nerves II-XII intact, strength normal in all extremities, sensation intact to light touch, sensation intact to pinprick, gait normal, and DTRs symmetrical and normal.   Skin:  turgor normal, color normal, no rashes, no suspicious lesions, no ecchymoses, no petechiae, and no purpura.   Cervical Nodes:  No lymphadenopathy noted Psych:  Cognition and judgment appear intact. Alert and cooperative with normal attention span and concentration. No apparent delusions, illusions, hallucinations  Diabetes Management Exam:    Foot Exam (with socks and/or shoes not present):       Sensory-Pinprick/Light touch:          Left medial foot (L-4):  normal          Left dorsal foot (L-5): normal          Left lateral foot (S-1): normal          Right medial foot (L-4): normal          Right dorsal foot (L-5): normal          Right lateral foot (S-1): normal       Sensory-Monofilament:          Left foot: normal          Right foot: normal       Inspection:          Left foot: normal          Right foot: normal       Nails:          Left foot: normal          Right foot: normal   Impression & Recommendations:  Problem # 1:  GERD (ICD-530.81) Assessment Improved  His updated medication list for this problem includes:    Dexilant 60 Mg Cpdr (Dexlansoprazole) ..... One by mouth once daily for heartburn    Levbid 0.375 Mg Xr12h-tab (Hyoscyamine sulfate) ..... One by mouth two times a day as needed for heartburn  Problem # 2:  AORTIC STENOSIS (ICD-424.1) Assessment: Unchanged his echo done 2 days ago shows mild AS with some LVH and DD but the vevtricle is not enlarged and he has no CV symptoms so there is no need for surgery or intervention at this time His updated medication list for this problem includes:    Tenoretic 100 100-25 Mg Tabs (Atenolol-chlorthalidone) .Marland Kitchen... 1 by mouth qd    Baby Aspirin 81 Mg Chew (Aspirin) ..... Once daily  Problem # 3:  DIABETES MELLITUS, TYPE II (ICD-250.00) Assessment: Unchanged  His updated medication list for this problem includes:    Baby Aspirin 81 Mg Chew (Aspirin) ..... Once daily    Glimepiride 2 Mg Tabs (Glimepiride) ..... Once daily    Losartan Potassium 100 Mg Tabs (Losartan potassium) ..... Once daily for high blood pressure, this replaces micardis  Labs Reviewed: Creat: 1.2 (05/08/2010)     Last Eye Exam: normal (04/30/2010) Reviewed HgBA1c results: 6.5 (05/08/2010)  6.5 (01/06/2010)  Complete Medication List: 1)  Lovastatin 40 Mg Tabs (Lovastatin) .... Once daily 2)  Tenoretic 100 100-25 Mg Tabs (Atenolol-chlorthalidone) .Marland Kitchen.. 1 by mouth qd 3)  Baby Aspirin 81 Mg Chew  (Aspirin) .... Once  daily 4)  Celexa 10 Mg Tabs (Citalopram hydrobromide) .Marland Kitchen.. 1 by mouth qd 5)  Vitamin D3 1000 Unit Tabs (Cholecalciferol) .Marland Kitchen.. 1 qd 6)  Glimepiride 2 Mg Tabs (Glimepiride) .... Once daily 7)  Contour Blood Glucose System W/device Kit (Blood glucose monitoring suppl) .... Use two times a day as directed 8)  Bayer Contour Test Strp (Glucose blood) .... Use two times a day as directed 9)  Losartan Potassium 100 Mg Tabs (Losartan potassium) .... Once daily for high blood pressure, this replaces micardis 10)  Nasonex 50 Mcg/act Susp (Mometasone furoate) .Marland Kitchen.. 1 spray each nostril every morning 11)  Dexilant 60 Mg Cpdr (Dexlansoprazole) .... One by mouth once daily for heartburn 12)  Levbid 0.375 Mg Xr12h-tab (Hyoscyamine sulfate) .... One by mouth two times a day as needed for heartburn  Patient Instructions: 1)  Please schedule a follow-up appointment in 3 months. 2)  Avoid foods high in acid (tomatoes, citrus juices, spicy foods). Avoid eating within two hours of lying down or before exercising. Do not over eat; try smaller more frequent meals. Elevate head of bed twelve inches when sleeping. 3)  It is important that you exercise regularly at least 20 minutes 5 times a week. If you develop chest pain, have severe difficulty breathing, or feel very tired , stop exercising immediately and seek medical attention. 4)  You need to lose weight. Consider a lower calorie diet and regular exercise.  5)  Check your blood sugars regularly. If your readings are usually above 200 or below 70 you should contact our office. 6)  It is important that your Diabetic A1c level is checked every 3 months. 7)  See your eye doctor yearly to check for diabetic eye damage. 8)  Check your feet each night for sore areas, calluses or signs of infection. 9)  Check your Blood Pressure regularly. If it is above 130/80: you should make an appointment. Prescriptions: DEXILANT 60 MG CPDR (DEXLANSOPRAZOLE) One by  mouth once daily for heartburn  #30 x 11   Entered and Authorized by:   [name] MD   Signed by:   [name] MD on 08/08/2010   Method used:   Electronically to        [facility]* (retail)       [address]       [place], [place]  [id number]       Ph: [phone]       Fax: [phone]   RxID:   [id number]   Current Allergies (reviewed today): ! LIPITOR (ATORVASTATIN CALCIUM) LOTENSIN

## 2010-12-30 NOTE — Letter (Signed)
Summary: Results Follow-up Letter  [facility]  [address] [place], [place] [address]   Phone: [phone]  Fax: [phone]    09/08/2010  [address] [place], [place]  [address]  Dear Ryan Duke,   The following are the results of your recent test(s):  Test     Result     Blood sugars   a little too high Liver/kidney   normal Prostate     normal Thyroid     normal CBC       normal Urine       normal  _________________________________________________________  Please call for an appointment as directed _________________________________________________________ _________________________________________________________ _________________________________________________________  Sincerely,  Sanda Linger MD [facility]

## 2010-12-30 NOTE — Letter (Signed)
Summary: Results Follow-up Letter  [facility]  [address] [place], [place] [address]   Phone: [phone]  Fax: [phone]    01/14/2010  [address] [place], [place]  [address]  Dear Mr. CAN,   The following are the results of your recent test(s):  Test     Result     A1c=6.5     good blood sugar control Liver/kidney   normal Urine       normal   _________________________________________________________  Please call for an appointment as directed _________________________________________________________ _________________________________________________________ _________________________________________________________  Sincerely,  Sanda Linger MD [facility]

## 2010-12-30 NOTE — Assessment & Plan Note (Signed)
Summary: 4 mo rov /nws  #   Vital Signs:  Patient profile:   70 year old male Height:      71 inches Weight:      251.75 pounds BMI:     35.24 O2 Sat:      96 % on Room air Temp:     98.4 degrees F oral Pulse rate:   62 / minute Pulse rhythm:   regular Resp:     16 per minute BP sitting:   122 / 78  (left arm) Cuff size:   large  Vitals Entered By: Rock Nephew CMA (May 08, 2010 8:37 AM)  Nutrition Counseling: Patient's BMI is greater than 25 and therefore counseled on weight management options.  O2 Flow:  Room air CC: follow-up visit// med refill, Lipid Management Is Patient Diabetic? Yes Did you bring your meter with you today? No Pain Assessment Patient in pain? no        Primary Care Provider:  [name] MD  CC:  follow-up visit// med refill and Lipid Management.  History of Present Illness:  Follow-Up Visit      This is a 70 year old man who presents for Follow-up visit.  The patient denies chest pain, palpitations, dizziness, syncope, low blood sugar symptoms, high blood sugar symptoms, edema, SOB, DOE, PND, and orthopnea.  Since the last visit the patient notes no new problems or concerns.  The patient reports taking meds as prescribed, monitoring BP, monitoring blood sugars, and dietary compliance.  When questioned about possible medication side effects, the patient notes none.    Lipid Management History:      Positive NCEP/ATP III risk factors include male age 72 years old or older, diabetes, hypertension, ASHD (either angina/prior MI/prior CABG), and peripheral vascular disease.  Negative NCEP/ATP III risk factors include no family history for ischemic heart disease, non-tobacco-user status, no prior stroke/TIA, and no history of aortic aneurysm.        The patient states that he knows about the "Therapeutic Lifestyle Change" diet.  His compliance with the TLC diet is fair.  The patient expresses understanding of adjunctive measures for cholesterol  lowering.  Adjunctive measures started by the patient include aerobic exercise, fiber, omega-3 supplements, and limit alcohol consumpton.  He expresses no side effects from his lipid-lowering medication.  The patient denies any symptoms to suggest myopathy or liver disease.     Preventive Screening-Counseling & Management  Alcohol-Tobacco     Alcohol drinks/day: 2     Alcohol type: wine     >5/day in last 3 mos: no     Alcohol Counseling: not indicated; use of alcohol is not excessive or problematic     Feels need to cut down: no     Feels annoyed by complaints: no     Feels guilty re: drinking: no     Needs 'eye opener' in am: no     Smoking Status: never  Hep-HIV-STD-Contraception     Hepatitis Risk: no risk noted     HIV Risk: no risk noted     STD Risk: no risk noted  Current Medications (verified): 1)  Lovastatin 40 Mg  Tabs (Lovastatin) .... Once Daily 2)  Tenoretic 100 100-25 Mg  Tabs (Atenolol-Chlorthalidone) .Marland Kitchen.. 1 By Mouth Qd 3)  Baby Aspirin 81 Mg  Chew (Aspirin) .... Once Daily 4)  Celexa 10 Mg  Tabs (Citalopram Hydrobromide) .Marland Kitchen.. 1 By Mouth Qd 5)  Vitamin D3 1000 Unit  Tabs (Cholecalciferol) .Marland KitchenMarland KitchenMarland Kitchen  1 Qd 6)  Glimepiride 2 Mg Tabs (Glimepiride) .... Once Daily 7)  Contour Blood Glucose System W/device Kit (Blood Glucose Monitoring Suppl) .... Use Two Times A Day As Directed 8)  Bayer Contour Test  Strp (Glucose Blood) .... Use Two Times A Day As Directed 9)  Losartan Potassium 100 Mg Tabs (Losartan Potassium) .... Once Daily For High Blood Pressure, This Replaces Micardis  Allergies (verified): 1)  ! Lipitor (Atorvastatin Calcium) 2)  Lotensin  Past History:  Past Medical History: Last updated: 02/25/2009 Hyperlipidemia Hypertension Diabetes mellitus, type II Hypogonadism Pancreatitis 2008 Renal insufficiency  Past Surgical History: Last updated: 02/15/2009 Denies surgical history  Family History: Last updated: 11/01/2007 Family History  Hypertension  Social History: Last updated: 02/25/2009 Retired Married Former Smoker 1976 3 dogs Drug use-no Regular exercise-yes Alcohol use-yes  Risk Factors: Alcohol Use: 2 (05/08/2010) >5 drinks/d w/in last 3 months: no (05/08/2010) Exercise: yes (02/15/2009)  Risk Factors: Smoking Status: never (05/08/2010)  Family History: Reviewed history from 11/01/2007 and no changes required. Family History Hypertension  Social History: Reviewed history from 02/25/2009 and no changes required. Retired Married Former Smoker 1976 3 dogs Drug use-no Regular exercise-yes Alcohol use-yes  Review of Systems  The patient denies anorexia, weight loss, chest pain, syncope, peripheral edema, prolonged cough, headaches, hemoptysis, abdominal pain, hematuria, suspicious skin lesions, depression, and unusual weight change.    Physical Exam  General:  alert, well-developed, well-nourished, well-hydrated, cooperative to examination, good hygiene, and overweight-appearing.   Mouth:  good dentition, no gingival abnormalities, no dental plaque, and pharynx pink and moist.   Neck:  supple, full ROM, no masses, no thyromegaly, no thyroid nodules or tenderness, normal carotid upstroke, no carotid bruits, and no cervical lymphadenopathy.   Lungs:  Normal respiratory effort, chest expands symmetrically. Lungs are clear to auscultation, no crackles or wheezes. Heart:  Grade  1/6 systolic ejection murmur, heard best over RUSB and LLSB.  regular rhythm and no gallop.   Abdomen:  soft, non-tender, normal bowel sounds, no distention, no masses, no guarding, no rigidity, no rebound tenderness, no abdominal hernia, no inguinal hernia, no hepatomegaly, and no splenomegaly.   Msk:  normal ROM, no joint tenderness, no joint swelling, no joint warmth, and no redness over joints.   Pulses:  R and L carotid,radial,femoral,dorsalis pedis and posterior tibial pulses are full and equal bilaterally Extremities:  1+  left pedal edema and 1+ right pedal edema.   Neurologic:  alert & oriented X3, cranial nerves II-XII intact, strength normal in all extremities, sensation intact to light touch, sensation intact to pinprick, gait normal, and DTRs symmetrical and normal.   Skin:  turgor normal, color normal, no rashes, no suspicious lesions, no ecchymoses, no petechiae, and no purpura.   Cervical Nodes:  No lymphadenopathy noted Psych:  Cognition and judgment appear intact. Alert and cooperative with normal attention span and concentration. No apparent delusions, illusions, hallucinations  Diabetes Management Exam:    Foot Exam (with socks and/or shoes not present):       Sensory-Pinprick/Light touch:          Left medial foot (L-4): normal          Left dorsal foot (L-5): normal          Left lateral foot (S-1): normal          Right medial foot (L-4): normal          Right dorsal foot (L-5): normal  Right lateral foot (S-1): normal       Sensory-Monofilament:          Left foot: normal          Right foot: normal       Inspection:          Left foot: normal          Right foot: normal       Nails:          Left foot: normal          Right foot: normal    Eye Exam:       Eye Exam done elsewhere          Date: 04/30/2010          Results: normal          Done by: yates at [facility]   Impression & Recommendations:  Problem # 1:  RENAL INSUFFICIENCY (ICD-588.9) Assessment Unchanged  Orders: Venipuncture (56213) TLB-BMP (Basic Metabolic Panel-BMET) (80048-METABOL) TLB-A1C / Hgb A1C (Glycohemoglobin) (83036-A1C) TLB-Udip w/ Micro (81001-URINE)  Problem # 2:  DIABETES MELLITUS, TYPE II (ICD-250.00) Assessment: Improved  His updated medication list for this problem includes:    Baby Aspirin 81 Mg Chew (Aspirin) ..... Once daily    Glimepiride 2 Mg Tabs (Glimepiride) ..... Once daily    Losartan Potassium 100 Mg Tabs (Losartan potassium) ..... Once daily for high blood pressure, this  replaces micardis  Orders: Venipuncture (08657) TLB-BMP (Basic Metabolic Panel-BMET) (80048-METABOL) TLB-A1C / Hgb A1C (Glycohemoglobin) (83036-A1C) TLB-Udip w/ Micro (81001-URINE)  Labs Reviewed: Creat: 1.3 (01/06/2010)     Last Eye Exam: normal (04/30/2010) Reviewed HgBA1c results: 6.5 (01/06/2010)  6.1 (06/13/2009)  Problem # 3:  HYPERTENSION (ICD-401.9) Assessment: Improved  His updated medication list for this problem includes:    Tenoretic 100 100-25 Mg Tabs (Atenolol-chlorthalidone) .Marland Kitchen... 1 by mouth qd    Losartan Potassium 100 Mg Tabs (Losartan potassium) ..... Once daily for high blood pressure, this replaces micardis  Orders: Venipuncture (84696) TLB-BMP (Basic Metabolic Panel-BMET) (80048-METABOL) TLB-A1C / Hgb A1C (Glycohemoglobin) (83036-A1C) TLB-Udip w/ Micro (81001-URINE)  BP today: 122/78 Prior BP: 124/70 (01/06/2010)  Prior 10 Yr Risk Heart Disease: N/A (02/20/2009)  Labs Reviewed: K+: 4.0 (01/06/2010) Creat: : 1.3 (01/06/2010)   Chol: 192 (06/13/2009)   HDL: 49.50 (06/13/2009)   LDL: DEL (12/03/2008)   TG: 214.0 (06/13/2009)  Complete Medication List: 1)  Lovastatin 40 Mg Tabs (Lovastatin) .... Once daily 2)  Tenoretic 100 100-25 Mg Tabs (Atenolol-chlorthalidone) .Marland Kitchen.. 1 by mouth qd 3)  Baby Aspirin 81 Mg Chew (Aspirin) .... Once daily 4)  Celexa 10 Mg Tabs (Citalopram hydrobromide) .Marland Kitchen.. 1 by mouth qd 5)  Vitamin D3 1000 Unit Tabs (Cholecalciferol) .Marland Kitchen.. 1 qd 6)  Glimepiride 2 Mg Tabs (Glimepiride) .... Once daily 7)  Contour Blood Glucose System W/device Kit (Blood glucose monitoring suppl) .... Use two times a day as directed 8)  Bayer Contour Test Strp (Glucose blood) .... Use two times a day as directed 9)  Losartan Potassium 100 Mg Tabs (Losartan potassium) .... Once daily for high blood pressure, this replaces micardis  Lipid Assessment/Plan:      Based on NCEP/ATP III, the patient's risk factor category is "history of coronary disease,  peripheral vascular disease, cerebrovascular disease, or aortic aneurysm along with either diabetes, current smoker, or LDL > 130 plus HDL < 40 plus triglycerides > 200".  The patient's lipid goals are as follows: Total cholesterol goal is 200; LDL cholesterol goal  is 70; HDL cholesterol goal is 40; Triglyceride goal is 150.    Patient Instructions: 1)  Please schedule a follow-up appointment in 4 months. 2)  It is important that you exercise regularly at least 20 minutes 5 times a week. If you develop chest pain, have severe difficulty breathing, or feel very tired , stop exercising immediately and seek medical attention. 3)  You need to lose weight. Consider a lower calorie diet and regular exercise.  4)  Check your blood sugars regularly. If your readings are usually above 200  or below 70 you should contact our office. 5)  It is important that your Diabetic A1c level is checked every 3 months. 6)  See your eye doctor yearly to check for diabetic eye damage. 7)  Check your feet each night for sore areas, calluses or signs of infection. 8)  Check your Blood Pressure regularly. If it is above 130/80: you should make an appointment.

## 2010-12-30 NOTE — Letter (Signed)
Summary: Lipid Letter  [facility]  [address] [place], [place] [address]   Phone: [phone]  Fax: [phone]    09/08/2010  [facility] [address] [place], [place]  [address]  Dear Chrissie Noa:  We have carefully reviewed your last lipid profile from 09/08/2010 and the results are noted below with a summary of recommendations for lipid management.    Cholesterol:       183     Goal: <200   HDL "good" Cholesterol:   57.84     Goal: >40   LDL "bad" Cholesterol:   117     Goal: <70   Triglycerides:       136.0     Goal: <150        TLC Diet (Therapeutic Lifestyle Change): Saturated Fats & Transfatty acids should be kept < 7% of total calories ***Reduce Saturated Fats Polyunstaurated Fat can be up to 10% of total calories Monounsaturated Fat Fat can be up to 20% of total calories Total Fat should be no greater than 25-35% of total calories Carbohydrates should be 50-60% of total calories Protein should be approximately 15% of total calories Fiber should be at least 20-30 grams a day ***Increased fiber may help lower LDL Total Cholesterol should be < 200mg /day Consider adding plant stanol/sterols to diet (example: Benacol spread) ***A higher intake of unsaturated fat may reduce Triglycerides and Increase HDL    Adjunctive Measures (may lower LIPIDS and reduce risk of Heart Attack) include: Aerobic Exercise (20-30 minutes 3-4 times a week) Limit Alcohol Consumption Weight Reduction Aspirin 75-81 mg a day by mouth (if not allergic or contraindicated) Dietary Fiber 20-30 grams a day by mouth     Current Medications: 1)    Lovastatin 40 Mg  Tabs (Lovastatin) .... Once daily 2)    Tenoretic 100 100-25 Mg  Tabs (Atenolol-chlorthalidone) .Marland Kitchen.. 1 by mouth qd 3)    Baby Aspirin 81 Mg  Chew (Aspirin) .... Once daily 4)    Celexa 10 Mg  Tabs (Citalopram hydrobromide) .Marland Kitchen.. 1 by mouth qd 5)    Vitamin D3 1000 Unit  Tabs (Cholecalciferol) .Marland Kitchen.. 1 qd 6)     Glimepiride 2 Mg Tabs (Glimepiride) .... Once daily 7)    Contour Blood Glucose System W/device Kit (Blood glucose monitoring suppl) .... Use two times a day as directed 8)    Bayer Contour Test  Strp (Glucose blood) .... Use two times a day as directed 9)    Losartan Potassium 100 Mg Tabs (Losartan potassium) .... Once daily for high blood pressure, this replaces micardis 10)    Nasonex 50 Mcg/act Susp (Mometasone furoate) .Marland Kitchen.. 1 spray each nostril every morning 11)    Dexilant 60 Mg Cpdr (Dexlansoprazole) .... One by mouth once daily for heartburn 12)    Levbid 0.375 Mg Xr12h-tab (Hyoscyamine sulfate) .... One by mouth two times a day as needed for heartburn  If you have any questions, please call. We appreciate being able to work with you.   Sincerely,     [facility] [name] MD

## 2011-01-12 ENCOUNTER — Other Ambulatory Visit: Payer: Self-pay | Admitting: Internal Medicine

## 2011-01-12 ENCOUNTER — Ambulatory Visit (INDEPENDENT_AMBULATORY_CARE_PROVIDER_SITE_OTHER): Payer: Medicare Other | Admitting: Internal Medicine

## 2011-01-12 ENCOUNTER — Encounter: Payer: Self-pay | Admitting: Internal Medicine

## 2011-01-12 ENCOUNTER — Other Ambulatory Visit: Payer: Medicare Other

## 2011-01-12 ENCOUNTER — Encounter (INDEPENDENT_AMBULATORY_CARE_PROVIDER_SITE_OTHER): Payer: Self-pay | Admitting: *Deleted

## 2011-01-12 DIAGNOSIS — E119 Type 2 diabetes mellitus without complications: Secondary | ICD-10-CM

## 2011-01-12 DIAGNOSIS — N259 Disorder resulting from impaired renal tubular function, unspecified: Secondary | ICD-10-CM

## 2011-01-12 DIAGNOSIS — K219 Gastro-esophageal reflux disease without esophagitis: Secondary | ICD-10-CM

## 2011-01-12 DIAGNOSIS — I359 Nonrheumatic aortic valve disorder, unspecified: Secondary | ICD-10-CM

## 2011-01-12 DIAGNOSIS — I10 Essential (primary) hypertension: Secondary | ICD-10-CM

## 2011-01-12 DIAGNOSIS — E785 Hyperlipidemia, unspecified: Secondary | ICD-10-CM

## 2011-01-12 LAB — CBC WITH DIFFERENTIAL/PLATELET
Basophils Absolute: 0 10*3/uL (ref 0.0–0.1)
Eosinophils Absolute: 0.2 10*3/uL (ref 0.0–0.7)
MCHC: 34.5 g/dL (ref 30.0–36.0)
MCV: 91.3 fl (ref 78.0–100.0)
Monocytes Absolute: 0.7 10*3/uL (ref 0.1–1.0)
Neutrophils Relative %: 60.9 % (ref 43.0–77.0)
Platelets: 246 10*3/uL (ref 150.0–400.0)
WBC: 6.8 10*3/uL (ref 4.5–10.5)

## 2011-01-12 LAB — HM DIABETES FOOT EXAM

## 2011-01-12 LAB — HEPATIC FUNCTION PANEL
Alkaline Phosphatase: 59 U/L (ref 39–117)
Bilirubin, Direct: 0.1 mg/dL (ref 0.0–0.3)
Total Bilirubin: 0.7 mg/dL (ref 0.3–1.2)
Total Protein: 6.9 g/dL (ref 6.0–8.3)

## 2011-01-12 LAB — BASIC METABOLIC PANEL
BUN: 21 mg/dL (ref 6–23)
CO2: 29 mEq/L (ref 19–32)
Chloride: 97 mEq/L (ref 96–112)
Creatinine, Ser: 1.3 mg/dL (ref 0.4–1.5)

## 2011-01-12 LAB — HEMOGLOBIN A1C: Hgb A1c MFr Bld: 7.1 % — ABNORMAL HIGH (ref 4.6–6.5)

## 2011-01-21 NOTE — Assessment & Plan Note (Signed)
Summary: 4 MO FU/#/CD   Vital Signs:  Patient profile:   70 year old male Height:      71 inches Weight:      253 pounds BMI:     35.41 O2 Sat:      98 % on Room air Temp:     98.1 degrees F oral Pulse rate:   61 / minute Pulse rhythm:   regular Resp:     16 per minute BP sitting:   132 / 76  (left arm) Cuff size:   large  Vitals Entered By: Rock Nephew CMA (January 12, 2011 8:31 AM)  Nutrition Counseling: Patient's BMI is greater than 25 and therefore counseled on weight management options.  O2 Flow:  Room air CC: follow-up visit, Lipid Management, Abdominal Pain Is Patient Diabetic? Yes Did you bring your meter with you today? No Pain Assessment Patient in pain? no        Primary Care Provider:  [name] MD  CC:  follow-up visit, Lipid Management, and Abdominal Pain.  History of Present Illness:  Follow-Up Visit      This is a 70 year old man who presents for Follow-up visit.  The patient denies chest pain, palpitations, dizziness, syncope, low blood sugar symptoms, high blood sugar symptoms, edema, SOB, DOE, PND, and orthopnea.  Since the last visit the patient notes no new problems or concerns.  The patient reports taking meds as prescribed, monitoring BP, monitoring blood sugars, and dietary noncompliance.  When questioned about possible medication side effects, the patient notes none.    Dyspepsia History:      He has no alarm features of dyspepsia including no history of melena, hematochezia, dysphagia, persistent vomiting, or involuntary weight loss > 5%.  There is a prior history of GERD.  The patient does not have a prior history of documented ulcer disease.  The dominant symptom is heartburn or acid reflux.  An H-2 blocker medication is currently being taken.  He notes that the symptoms have improved with the H-2 blocker therapy.  Symptoms have not persisted after 4 weeks of H-2 blocker treatment.    Lipid Management History:      Positive NCEP/ATP  III risk factors include male age 67 years old or older, diabetes, HDL cholesterol less than 40, hypertension, ASHD (either angina/prior MI/prior CABG), and peripheral vascular disease.  Negative NCEP/ATP III risk factors include no family history for ischemic heart disease, non-tobacco-user status, no prior stroke/TIA, and no history of aortic aneurysm.        The patient states that he knows about the "Therapeutic Lifestyle Change" diet.  His compliance with the TLC diet is fair.  The patient expresses understanding of adjunctive measures for cholesterol lowering.  Adjunctive measures started by the patient include aerobic exercise, fiber, limit alcohol consumpton, and weight reduction.  He expresses no side effects from his lipid-lowering medication.  The patient denies any symptoms to suggest myopathy or liver disease.     Preventive Screening-Counseling & Management  Alcohol-Tobacco     Alcohol drinks/day: 2     Alcohol type: wine     >5/day in last 3 mos: no     Alcohol Counseling: not indicated; use of alcohol is not excessive or problematic     Feels need to cut down: no     Feels annoyed by complaints: no     Feels guilty re: drinking: no     Needs 'eye opener' in am: no  Smoking Status: never     Passive Smoke Exposure: no     Tobacco Counseling: not indicated; no tobacco use  Hep-HIV-STD-Contraception     Hepatitis Risk: no risk noted     HIV Risk: no risk noted     STD Risk: no risk noted      Sexual History:  currently monogamous.        Drug Use:  never.        Blood Transfusions:  no.    Clinical Review Panels:  Prevention   Last Colonoscopy:  DONE (10/01/2009)   Last PSA:  0.76 (09/08/2010)  Immunizations   Last Flu Vaccine:  Fluvax 3+ (09/08/2010)   Last Pneumovax:  Pneumovax (06/11/2006)  Lipid Management   Cholesterol:  183 (09/08/2010)   LDL (bad choesterol):  117 (09/08/2010)   HDL (good cholesterol):  38.80 (09/08/2010)   Triglycerides:  171  (10/06/2006)  Diabetes Management   HgBA1C:  7.1 (09/08/2010)   Creatinine:  1.3 (09/08/2010)   Last Dilated Eye Exam:  normal (04/30/2010)   Last Foot Exam:  yes (01/12/2011)   Last Flu Vaccine:  Fluvax 3+ (09/08/2010)   Last Pneumovax:  Pneumovax (06/11/2006)  CBC   WBC:  6.6 (09/08/2010)   RBC:  4.75 (09/08/2010)   Hgb:  14.9 (09/08/2010)   Hct:  43.0 (09/08/2010)   Platelets:  271.0 (09/08/2010)   MCV  90.4 (09/08/2010)   MCHC  34.7 (09/08/2010)   RDW  13.1 (09/08/2010)   PMN:  62.6 (09/08/2010)   Lymphs:  23.4 (09/08/2010)   Monos:  8.8 (09/08/2010)   Eosinophils:  4.3 (09/08/2010)   Basophil:  0.9 (09/08/2010)  Complete Metabolic Panel   Glucose:  113 (09/08/2010)   Sodium:  139 (09/08/2010)   Potassium:  4.1 (09/08/2010)   Chloride:  101 (09/08/2010)   CO2:  30 (09/08/2010)   BUN:  18 (09/08/2010)   Creatinine:  1.3 (09/08/2010)   Albumin:  4.1 (09/08/2010)   Total Protein:  7.0 (09/08/2010)   Calcium:  9.8 (09/08/2010)   Total Bili:  0.7 (09/08/2010)   Alk Phos:  55 (09/08/2010)   SGPT (ALT):  22 (09/08/2010)   SGOT (AST):  21 (09/08/2010)   Medications Prior to Update: 1)  Lovastatin 40 Mg  Tabs (Lovastatin) .... Once Daily 2)  Tenoretic 100 100-25 Mg  Tabs (Atenolol-Chlorthalidone) .Marland Kitchen.. 1 By Mouth Qd 3)  Baby Aspirin 81 Mg  Chew (Aspirin) .... Once Daily 4)  Celexa 10 Mg  Tabs (Citalopram Hydrobromide) .Marland Kitchen.. 1 By Mouth Qd 5)  Vitamin D3 1000 Unit  Tabs (Cholecalciferol) .Marland Kitchen.. 1 Qd 6)  Glimepiride 2 Mg Tabs (Glimepiride) .... Once Daily 7)  Contour Blood Glucose System W/device Kit (Blood Glucose Monitoring Suppl) .... Use Two Times A Day As Directed 8)  Bayer Contour Test  Strp (Glucose Blood) .... Use Two Times A Day As Directed 9)  Losartan Potassium 100 Mg Tabs (Losartan Potassium) .... Once Daily For High Blood Pressure, This Replaces Micardis 10)  Nasonex 50 Mcg/act Susp (Mometasone Furoate) .Marland Kitchen.. 1 Spray Each Nostril Every Morning 11)  Dexilant 60 Mg  Cpdr (Dexlansoprazole) .... One By Mouth Once Daily For Heartburn 12)  Levbid 0.375 Mg Xr12h-Tab (Hyoscyamine Sulfate) .... One By Mouth Two Times A Day As Needed For Heartburn  Current Medications (verified): 1)  Lovastatin 40 Mg  Tabs (Lovastatin) .... Once Daily 2)  Tenoretic 100 100-25 Mg  Tabs (Atenolol-Chlorthalidone) .Marland Kitchen.. 1 By Mouth Qd 3)  Baby Aspirin 81 Mg  Chew (Aspirin) .Marland KitchenMarland KitchenMarland Kitchen  Once Daily 4)  Celexa 10 Mg  Tabs (Citalopram Hydrobromide) .Marland Kitchen.. 1 By Mouth Qd 5)  Vitamin D3 1000 Unit  Tabs (Cholecalciferol) .Marland Kitchen.. 1 Qd 6)  Glimepiride 2 Mg Tabs (Glimepiride) .... Once Daily 7)  Contour Blood Glucose System W/device Kit (Blood Glucose Monitoring Suppl) .... Use Two Times A Day As Directed 8)  Bayer Contour Test  Strp (Glucose Blood) .... Use Two Times A Day As Directed 9)  Losartan Potassium 100 Mg Tabs (Losartan Potassium) .... Once Daily For High Blood Pressure, This Replaces Micardis 10)  Nasonex 50 Mcg/act Susp (Mometasone Furoate) .Marland Kitchen.. 1 Spray Each Nostril Every Morning 11)  Dexilant 60 Mg Cpdr (Dexlansoprazole) .... One By Mouth Once Daily For Heartburn 12)  Levbid 0.375 Mg Xr12h-Tab (Hyoscyamine Sulfate) .... One By Mouth Two Times A Day As Needed For Heartburn  Allergies (verified): 1)  ! Lipitor (Atorvastatin Calcium) 2)  Lotensin  Past History:  Past Medical History: Last updated: 06/09/2010 Hyperlipidemia Hypertension Diabetes mellitus, type II Hypogonadism Pancreatitis 2008  Renal insufficiency  Past Surgical History: Last updated: 02/15/2009 Denies surgical history  Family History: Last updated: 11/01/2007 Family History Hypertension  Social History: Last updated: 02/25/2009 Retired Married Former Smoker 1976 3 dogs Drug use-no Regular exercise-yes Alcohol use-yes  Risk Factors: Alcohol Use: 2 (01/12/2011) >5 drinks/d w/in last 3 months: no (01/12/2011) Exercise: yes (02/15/2009)  Risk Factors: Smoking Status: never (01/12/2011) Passive Smoke  Exposure: no (01/12/2011)  Family History: Reviewed history from 11/01/2007 and no changes required. Family History Hypertension  Social History: Reviewed history from 02/25/2009 and no changes required. Retired Married Former Smoker 1976 3 dogs Drug use-no Regular exercise-yes Alcohol use-yes  Review of Systems       The patient complains of weight gain.  The patient denies anorexia, fever, weight loss, chest pain, syncope, dyspnea on exertion, peripheral edema, prolonged cough, headaches, hemoptysis, abdominal pain, hematuria, suspicious skin lesions, difficulty walking, and depression.   Endo:  Denies cold intolerance, excessive hunger, excessive thirst, excessive urination, heat intolerance, polyuria, and weight change.  Physical Exam  General:  alert, well-developed, well-nourished, well-hydrated, appropriate dress, normal appearance, healthy-appearing, cooperative to examination, good hygiene, and overweight-appearing.   Head:  Normocephalic and atraumatic without obvious abnormalities. No apparent alopecia or balding. Mouth:  Oral mucosa and oropharynx without lesions or exudates.  Teeth in good repair. Neck:  supple, full ROM, no masses, no thyromegaly, no thyroid nodules or tenderness, no JVD, normal carotid upstroke, no carotid bruits, no cervical lymphadenopathy, and no neck tenderness.   Lungs:  Normal respiratory effort, chest expands symmetrically. Lungs are clear to auscultation, no crackles or wheezes. Heart:  normal rate, regular rhythm, no gallop, no rub, and Grade  1/6 systolic ejection murmur over RUSB with radiation everywhere. no jvd. Abdomen:  Bowel sounds positive,abdomen soft and non-tender without masses, organomegaly or hernias noted. Msk:  No deformity or scoliosis noted of thoracic or lumbar spine.   Pulses:  R and L carotid,radial,femoral,dorsalis pedis and posterior tibial pulses are full and equal bilaterally Extremities:  1+ left pedal edema and 1+  right pedal edema.   Neurologic:  No cranial nerve deficits noted. Station and gait are normal. Plantar reflexes are down-going bilaterally. DTRs are symmetrical throughout. Sensory, motor and coordinative functions appear intact. Skin:  Intact without suspicious lesions or rashes Cervical Nodes:  no anterior cervical adenopathy and no posterior cervical adenopathy.   Psych:  Cognition and judgment appear intact. Alert and cooperative with normal attention span and concentration. No apparent  delusions, illusions, hallucinations  Diabetes Management Exam:    Foot Exam (with socks and/or shoes not present):       Sensory-Pinprick/Light touch:          Left medial foot (L-4): normal          Left dorsal foot (L-5): normal          Left lateral foot (S-1): normal          Right medial foot (L-4): normal          Right dorsal foot (L-5): normal          Right lateral foot (S-1): normal       Sensory-Monofilament:          Left foot: normal          Right foot: normal       Inspection:          Left foot: normal          Right foot: normal       Nails:          Left foot: normal          Right foot: normal   Impression & Recommendations:  Problem # 1:  AORTIC STENOSIS (ICD-424.1) Assessment Unchanged  His updated medication list for this problem includes:    Tenoretic 100 100-25 Mg Tabs (Atenolol-chlorthalidone) .Marland Kitchen... 1 by mouth qd    Baby Aspirin 81 Mg Chew (Aspirin) ..... Once daily  Problem # 2:  DIABETES MELLITUS, TYPE II (ICD-250.00) Assessment: Unchanged  His updated medication list for this problem includes:    Baby Aspirin 81 Mg Chew (Aspirin) ..... Once daily    Glimepiride 2 Mg Tabs (Glimepiride) ..... Once daily    Losartan Potassium 100 Mg Tabs (Losartan potassium) ..... Once daily for high blood pressure, this replaces micardis  Orders: Venipuncture (04540) TLB-BMP (Basic Metabolic Panel-BMET) (80048-METABOL) TLB-CBC Platelet - w/Differential  (85025-CBCD) TLB-Hepatic/Liver Function Pnl (80076-HEPATIC) TLB-TSH (Thyroid Stimulating Hormone) (84443-TSH) TLB-A1C / Hgb A1C (Glycohemoglobin) (83036-A1C)  Labs Reviewed: Creat: 1.3 (09/08/2010)     Last Eye Exam: normal (04/30/2010) Reviewed HgBA1c results: 7.1 (09/08/2010)  6.5 (05/08/2010)  Problem # 3:  RENAL INSUFFICIENCY (ICD-588.9) Assessment: Unchanged  Orders: Venipuncture (98119) TLB-BMP (Basic Metabolic Panel-BMET) (80048-METABOL) TLB-CBC Platelet - w/Differential (85025-CBCD) TLB-Hepatic/Liver Function Pnl (80076-HEPATIC) TLB-TSH (Thyroid Stimulating Hormone) (84443-TSH) TLB-A1C / Hgb A1C (Glycohemoglobin) (83036-A1C)  Problem # 4:  HYPERTENSION (ICD-401.9) Assessment: Improved  His updated medication list for this problem includes:    Tenoretic 100 100-25 Mg Tabs (Atenolol-chlorthalidone) .Marland Kitchen... 1 by mouth qd    Losartan Potassium 100 Mg Tabs (Losartan potassium) ..... Once daily for high blood pressure, this replaces micardis  Orders: Venipuncture (14782) TLB-BMP (Basic Metabolic Panel-BMET) (80048-METABOL) TLB-CBC Platelet - w/Differential (85025-CBCD) TLB-Hepatic/Liver Function Pnl (80076-HEPATIC) TLB-TSH (Thyroid Stimulating Hormone) (84443-TSH) TLB-A1C / Hgb A1C (Glycohemoglobin) (83036-A1C)  BP today: 132/76 Prior BP: 130/70 (09/08/2010)  Prior 10 Yr Risk Heart Disease: N/A (02/20/2009)  Labs Reviewed: K+: 4.1 (09/08/2010) Creat: : 1.3 (09/08/2010)   Chol: 183 (09/08/2010)   HDL: 38.80 (09/08/2010)   LDL: 117 (09/08/2010)   TG: 136.0 (09/08/2010)  Problem # 5:  HYPERLIPIDEMIA (ICD-272.4) Assessment: Unchanged  His updated medication list for this problem includes:    Lovastatin 40 Mg Tabs (Lovastatin) ..... Once daily  Orders: Venipuncture (95621) TLB-BMP (Basic Metabolic Panel-BMET) (80048-METABOL) TLB-CBC Platelet - w/Differential (85025-CBCD) TLB-Hepatic/Liver Function Pnl (80076-HEPATIC) TLB-TSH (Thyroid Stimulating Hormone)  (84443-TSH) TLB-A1C / Hgb A1C (Glycohemoglobin) (83036-A1C)  Problem # 6:  GERD (ICD-530.81) Assessment: Improved  His updated medication list for this problem includes:    Dexilant 60 Mg Cpdr (Dexlansoprazole) ..... One by mouth once daily for heartburn    Levbid 0.375 Mg Xr12h-tab (Hyoscyamine sulfate) ..... One by mouth two times a day as needed for heartburn  Orders: Venipuncture ([address]) TLB-BMP (Basic Metabolic Panel-BMET) (80048-METABOL) TLB-CBC Platelet - w/Differential (85025-CBCD) TLB-Hepatic/Liver Function Pnl (80076-HEPATIC) TLB-TSH (Thyroid Stimulating Hormone) (84443-TSH) TLB-A1C / Hgb A1C (Glycohemoglobin) (83036-A1C)  Labs Reviewed: Hgb: 14.9 (09/08/2010)   Hct: 43.0 (09/08/2010)  Complete Medication List: 1)  Lovastatin 40 Mg Tabs (Lovastatin) .... Once daily 2)  Tenoretic 100 100-25 Mg Tabs (Atenolol-chlorthalidone) .Marland Kitchen.. 1 by mouth qd 3)  Baby Aspirin 81 Mg Chew (Aspirin) .... Once daily 4)  Celexa 10 Mg Tabs (Citalopram hydrobromide) .Marland Kitchen.. 1 by mouth qd 5)  Vitamin D3 1000 Unit Tabs (Cholecalciferol) .Marland Kitchen.. 1 qd 6)  Glimepiride 2 Mg Tabs (Glimepiride) .... Once daily 7)  Contour Blood Glucose System W/device Kit (Blood glucose monitoring suppl) .... Use two times a day as directed 8)  Bayer Contour Test Strp (Glucose blood) .... Use two times a day as directed 9)  Losartan Potassium 100 Mg Tabs (Losartan potassium) .... Once daily for high blood pressure, this replaces micardis 10)  Nasonex 50 Mcg/act Susp (Mometasone furoate) .Marland Kitchen.. 1 spray each nostril every morning 11)  Dexilant 60 Mg Cpdr (Dexlansoprazole) .... One by mouth once daily for heartburn 12)  Levbid 0.375 Mg Xr12h-tab (Hyoscyamine sulfate) .... One by mouth two times a day as needed for heartburn  Lipid Assessment/Plan:      Based on NCEP/ATP III, the patient's risk factor category is "history of coronary disease, peripheral vascular disease, cerebrovascular disease, or aortic aneurysm along with either  diabetes, current smoker, or LDL > 130 plus HDL < 40 plus triglycerides > 200".  The patient's lipid goals are as follows: Total cholesterol goal is 200; LDL cholesterol goal is 70; HDL cholesterol goal is 40; Triglyceride goal is 150.     Patient Instructions: 1)  Please schedule a follow-up appointment in 4 months. 2)  It is important that you exercise regularly at least 20 minutes 5 times a week. If you develop chest pain, have severe difficulty breathing, or feel very tired , stop exercising immediately and seek medical attention. 3)  You need to lose weight. Consider a lower calorie diet and regular exercise.  4)  Check your blood sugars regularly. If your readings are usually above 200 or below 70 you should contact our office. 5)  It is important that your Diabetic A1c level is checked every 3 months. 6)  See your eye doctor yearly to check for diabetic eye damage. 7)  Check your feet each night for sore areas, calluses or signs of infection. 8)  Check your Blood Pressure regularly. If it is above 130/80: you should make an appointment.   Orders Added: 1)  Venipuncture [36415] 2)  TLB-BMP (Basic Metabolic Panel-BMET) [80048-METABOL] 3)  TLB-CBC Platelet - w/Differential [85025-CBCD] 4)  TLB-Hepatic/Liver Function Pnl [80076-HEPATIC] 5)  TLB-TSH (Thyroid Stimulating Hormone) [84443-TSH] 6)  TLB-A1C / Hgb A1C (Glycohemoglobin) [83036-A1C] 7)  Est. Patient Level IV [60454]

## 2011-01-21 NOTE — Letter (Signed)
Summary: Results Follow-up Letter  [facility]  [address] [place], [place] [address]   Phone: [phone]  Fax: [phone]    01/12/2011  [id number] [place], [place]  [address]  [place]  Dear [name],   The following are the results of your recent test(s):  Test     Result     Blood sugars   good control Liver/kidney   normal Thyroid     normal   _________________________________________________________  Please call for an appointment as directed _________________________________________________________ _________________________________________________________ _________________________________________________________  Sincerely,  Sanda Linger MD [facility]

## 2011-02-27 ENCOUNTER — Other Ambulatory Visit: Payer: Self-pay | Admitting: Internal Medicine

## 2011-03-04 ENCOUNTER — Ambulatory Visit (INDEPENDENT_AMBULATORY_CARE_PROVIDER_SITE_OTHER): Payer: Medicare Other | Admitting: Internal Medicine

## 2011-03-04 ENCOUNTER — Encounter: Payer: Self-pay | Admitting: Internal Medicine

## 2011-03-04 ENCOUNTER — Ambulatory Visit (INDEPENDENT_AMBULATORY_CARE_PROVIDER_SITE_OTHER)
Admission: RE | Admit: 2011-03-04 | Discharge: 2011-03-04 | Disposition: A | Discharge status: Home / Self Care | Payer: Medicare Other | Source: Ambulatory Visit | Attending: Internal Medicine | Admitting: Internal Medicine

## 2011-03-04 VITALS — BP 136/80 | HR 54 | Temp 98.7°F | Wt 249.0 lb

## 2011-03-04 DIAGNOSIS — J209 Acute bronchitis, unspecified: Secondary | ICD-10-CM

## 2011-03-04 DIAGNOSIS — R05 Cough: Secondary | ICD-10-CM

## 2011-03-04 DIAGNOSIS — R059 Cough, unspecified: Secondary | ICD-10-CM

## 2011-03-04 LAB — GLUCOSE, CAPILLARY
Glucose-Capillary: 104 mg/dL — ABNORMAL HIGH (ref 70–99)
Glucose-Capillary: 120 mg/dL — ABNORMAL HIGH (ref 70–99)

## 2011-03-04 MED ORDER — PSEUDOEPH-CHLORPHEN-HYDROCOD 60-4-5 MG/5ML PO SOLN: 5.0000 mL | Freq: Four times a day (QID) | ORAL | Status: DC | PRN

## 2011-03-04 MED ORDER — MOXIFLOXACIN HCL 400 MG PO TABS: 400.0000 mg | ORAL_TABLET | Freq: Every day | ORAL | Status: AC

## 2011-03-04 NOTE — Patient Instructions (Signed)
Bronchitis Bronchitis is the body's way of reacting to injury and/or infection (inflammation) of the bronchi. Bronchi are the air tubes that extend from the windpipe into the lungs. If the inflammation becomes severe, it may cause shortness of breath.  CAUSES Inflammation may be caused by:  A virus.   Germs (bacteria).   Dust.   Allergens.   Pollutants and many other irritants.  The cells lining the bronchial tree are covered with tiny hairs (cilia). These constantly beat upward, away from the lungs, toward the mouth. This keeps the lungs free of pollutants. When these cells become too irritated and are unable to do their job, mucus begins to develop. This causes the characteristic cough of bronchitis. The cough clears the lungs when the cilia are unable to do their job. Without either of these protective mechanisms, the mucus would settle in the lungs. Then you would develop pneumonia. Smoking is a common cause of bronchitis and can contribute to pneumonia. Stopping this habit is the single most important thing you can do to help yourself. TREATMENT  Your caregiver may prescribe an antibiotic if the cough is caused by bacteria. Also, medicines that open up your airways make it easier to breathe. Your caregiver may also recommend or prescribe an expectorant. It will loosen the mucus to be coughed up. Only take over-the-counter or prescription medicines for pain, discomfort, or fever as directed by your caregiver.   Removing whatever causes the problem (smoking, for example) is critical to preventing the problem from getting worse.   Cough suppressants may be prescribed for relief of cough symptoms.   Inhaled medicines may be prescribed to help with symptoms now and to help prevent problems from returning.   For those with recurrent (chronic) bronchitis, there may be a need for steroid medicines.  SEEK IMMEDIATE MEDICAL CARE IF:  During treatment, you develop more pus-like mucus  (purulent sputum).   You or your child has an oral temperature above 100.5, not controlled by medicine.   Your baby is older than 3 months with a rectal temperature of 102 F (38.9 C) or higher.   Your baby is 3 months old or younger with a rectal temperature of 100.4 F (38 C) or higher.   You become progressively more ill.   You have increased difficulty breathing, wheezing, or shortness of breath.  It is necessary to seek immediate medical care if you are elderly or sick from any other disease. MAKE SURE YOU:  Understand these instructions.   Will watch your condition.   Will get help right away if you are not doing well or get worse.  Document Released: 11/16/2005 Document Re-Released: 02/10/2010 [employer] Patient Information 2011 [employer]. 

## 2011-03-04 NOTE — Assessment & Plan Note (Signed)
Will check a cxr to look for pna, mass, edema, etc

## 2011-03-04 NOTE — Progress Notes (Signed)
Subjective:     Powell Halbert Heidenreich is a 70 y.o. male who presents for evaluation of symptoms of a URI. Symptoms include achiness, congestion, low grade fever, nasal congestion and productive cough with  clear colored sputum. Onset of symptoms was 5 days ago, and has been gradually worsening since that time. Treatment to date: none.  The following portions of the patient's history were reviewed and updated as appropriate: allergies, current medications, past family history, past medical history, past social history, past surgical history and problem list.  Review of Systems Constitutional: positive for fatigue and fevers, negative for anorexia, chills, malaise, night sweats, sweats and weight loss Ears, nose, mouth, throat, and face: positive for nasal congestion and sore throat, negative for ear drainage, earaches, epistaxis, hearing loss, hoarseness, tinnitus and voice change Respiratory: positive for cough and sputum, negative for asthma, chronic bronchitis, dyspnea on exertion, emphysema, hemoptysis, pleurisy/chest pain, stridor and wheezing   Objective:    BP 136/80  Pulse 54  Temp(Src) 98.7 F (37.1 C) (Oral)  Wt 249 lb (112.946 kg)  SpO2 96% General appearance: alert, cooperative, no distress and moderately obese Head: Normocephalic, without obvious abnormality, atraumatic Ears: normal TM's and external ear canals both ears Nose: Nares normal. Septum midline. Mucosa normal. No drainage or sinus tenderness. Throat: abnormal findings: mild oropharyngeal erythema Neck: no adenopathy, no carotid bruit, no JVD, supple, symmetrical, trachea midline and thyroid not enlarged, symmetric, no tenderness/mass/nodules Lungs: rales base - right Heart: regular rate and rhythm, S1, S2 normal, no murmur, click, rub or gallop Abdomen: soft, non-tender; bowel sounds normal; no masses,  no organomegaly Extremities: extremities normal, atraumatic, no cyanosis or edema Lymph nodes: Cervical,  supraclavicular, and axillary nodes normal.   Assessment:    bronchitis and viral upper respiratory illness   Plan:    Discussed diagnosis and treatment of URI. Suggested symptomatic OTC remedies. Nasal saline spray for congestion. Avelox per orders. Follow up as needed. Call in 3 days if symptoms aren't resolving. Follow up in 2 weeks or as needed.

## 2011-03-05 ENCOUNTER — Ambulatory Visit: Payer: Medicare Other | Admitting: Internal Medicine

## 2011-03-12 LAB — URINE MICROSCOPIC-ADD ON

## 2011-03-12 LAB — URINALYSIS, ROUTINE W REFLEX MICROSCOPIC
Glucose, UA: NEGATIVE mg/dL
Leukocytes, UA: NEGATIVE
Protein, ur: 30 mg/dL — AB
Specific Gravity, Urine: 1.024 (ref 1.005–1.030)
Urobilinogen, UA: 1 mg/dL (ref 0.0–1.0)

## 2011-03-12 LAB — CBC
HCT: 43.6 % (ref 39.0–52.0)
Hemoglobin: 15.3 g/dL (ref 13.0–17.0)
MCHC: 34.5 g/dL (ref 30.0–36.0)
MCV: 93.8 fL (ref 78.0–100.0)
RBC: 4.53 MIL/uL (ref 4.22–5.81)
RBC: 4.65 MIL/uL (ref 4.22–5.81)
WBC: 10 10*3/uL (ref 4.0–10.5)
WBC: 9 10*3/uL (ref 4.0–10.5)

## 2011-03-12 LAB — CARDIAC PANEL(CRET KIN+CKTOT+MB+TROPI)
CK, MB: 1.7 ng/mL (ref 0.3–4.0)
CK, MB: 2.9 ng/mL (ref 0.3–4.0)
Relative Index: INVALID (ref 0.0–2.5)
Total CK: 50 U/L (ref 7–232)
Total CK: 55 U/L (ref 7–232)
Troponin I: 0.08 ng/mL — ABNORMAL HIGH (ref 0.00–0.06)

## 2011-03-12 LAB — COMPREHENSIVE METABOLIC PANEL
Albumin: 3.5 g/dL (ref 3.5–5.2)
Alkaline Phosphatase: 58 U/L (ref 39–117)
BUN: 22 mg/dL (ref 6–23)
CO2: 30 mEq/L (ref 19–32)
Chloride: 101 mEq/L (ref 96–112)
GFR calc non Af Amer: 37 mL/min — ABNORMAL LOW (ref >60)
Glucose, Bld: 169 mg/dL — ABNORMAL HIGH (ref 70–99)
Potassium: 4.1 mEq/L (ref 3.5–5.1)
Total Bilirubin: 0.7 mg/dL (ref 0.3–1.2)

## 2011-03-12 LAB — GLUCOSE, CAPILLARY: Glucose-Capillary: 119 mg/dL — ABNORMAL HIGH (ref 70–99)

## 2011-04-14 NOTE — Discharge Summary (Signed)
NAME:  Ryan Duke, Ryan Duke NO.:  [id number]   MEDICAL RECORD NO.:  [id number]          PATIENT TYPE:  OBV   LOCATION:  3731                         FACILITY:  [facility]   PHYSICIAN:  Valerie A. Felicity Coyer, MDDATE OF BIRTH:  [birth year]   DATE OF ADMISSION:  02/20/2009  DATE OF DISCHARGE:  02/21/2009                               DISCHARGE SUMMARY   DISCHARGE DIAGNOSES:  1. Near syncope.  2. Diaphoretic spell, rule out angina, negative telemetry, negative      serial enzymes.  3. Hypertension.  4. Type 2 diabetes.  5. Dyslipidemia.  6. History of pancreatitis in 2008.  7. Mild renal insufficiency with a creatinine of 1.8, hold metformin      until further followup with primary medical doctor.   DISCHARGE MEDICATIONS:  1. Holding metformin 1000 mg b.i.d. until followup with MD.  2. Cialis 20 mg p.r.n. also on hold until further followup and cardiac      clearance with primary MD.  3. His other medications are as prior to admission without change      include clonidine 0.3 mg b.i.d.  4. Lovastatin 40 mg nightly.  5. Tenoretic 100/25 p.o. daily.  6. Aspirin 81 mg daily.  7. Celexa 10 mg daily.  8. Vitamin D3 1000 units daily.  9. Amaryl 1 mg daily.  10.Tramadol 50 mg 1-2 p.o. q.6 h. p.r.n. pain.   DISPOSITION:  The patient is discharged home.   CONDITION:  Medically improved and stable.  No further recurrence of  symptoms during this hospitalization.   Hospital followup is with primary care physician, Dr. Sonda Primes on  Wednesday, March 13, 2009, at 3:30 p.m.  The patient is instructed to  call MD or go to the emergency room if there are recurrent symptoms or  spells prior to his next scheduled visit.  Dr. Posey Rea will review  his cardiac history and workup and if a stress test has not been  performed in the last 12 months, will consider repeating at this time  for further evaluation of cardiac potential abnormalities.   HOSPITAL COURSE:  1. Near syncope.   The patient is a 70 year old gentleman with multiple      chronic medical issues who was sent to the emergency room by his      primary care physician's office following a visit for near syncope.      On the morning of admission while sitting at the breakfast table,      wife noted that he had become very pale and diaphoretic with      seeming poor cohesion and impatient speech.  His blood sugar at      that time had been over 200, and there was no associated chest      pain, no associated incontinence, no headache, no loss of      consciousness.  He did feel nauseous.  But with time, these      symptoms resolved over 15- to 20-minute period of time, at which      point he got up to take his shower and felt his normal  self since,      however, because of wife's concern over the symptoms and known      previous history of recurrent similar symptoms, they came to see      their primary care physician office and was referred for admission      for near syncope, especially to rule out cardiac etiology.  He was      monitored on telemetry where no arrhythmias could be identified.      Serial cardiac enzymes x4 were performed and all negative for any      evidence of acute coronary event.  His blood pressure remained      stable.  There was no hypoglycemia, and other laboratory evaluation      showed no evidence of infection, anemia, or abnormality.  He may      have had some mild dehydration with simple renal insufficiency up      to 1.8.  He was given gentle hydration during this hospitalization,      but these values were not checked.  He also had a head CT      performed, which was negative for any acute abnormality and 2-view      chest x-ray which also was negative for acute abnormality and a 2-D      echo which showed no wall motion abnormalities and a normal EF of      55-65%.  Having had no further recurrent events and normal      evaluation with negative testing as described here, he  is felt      stable for discharge home for further evaluation and cardiac workup      to include a stress test was recommended, as this has not been done      in the last 12 months.  This has been reviewed with the patient and      family who agreed with this plan.  He will follow up with their      primary care physician as scheduled above to continue this workup      there.  They are reminded to return to the emergency room if there      are further concerns prior to their scheduled appointment time.  As      stated above, he will also hold his metformin due to creatinine      level 1.8 until further followup with primary MD and recheck and      will hold off on any further erectile dysfunction medications until      further cardiac clearance has been obtained.   Over 30 minutes spent on the day of discharge coordination with review  of the patient's symptoms, tests, labs, and medication reconciliation.      Valerie A. Felicity Coyer, MD  Electronically Signed     VAL/MEDQ  D:  02/21/2009  T:  02/22/2009  Job:  [id number]

## 2011-04-20 ENCOUNTER — Other Ambulatory Visit: Payer: Self-pay | Admitting: Internal Medicine

## 2011-05-12 ENCOUNTER — Encounter: Payer: Self-pay | Admitting: Internal Medicine

## 2011-05-13 ENCOUNTER — Other Ambulatory Visit (INDEPENDENT_AMBULATORY_CARE_PROVIDER_SITE_OTHER): Payer: Medicare Other

## 2011-05-13 ENCOUNTER — Encounter: Payer: Self-pay | Admitting: Internal Medicine

## 2011-05-13 ENCOUNTER — Ambulatory Visit (INDEPENDENT_AMBULATORY_CARE_PROVIDER_SITE_OTHER): Payer: Medicare Other | Admitting: Internal Medicine

## 2011-05-13 DIAGNOSIS — I359 Nonrheumatic aortic valve disorder, unspecified: Secondary | ICD-10-CM

## 2011-05-13 DIAGNOSIS — E785 Hyperlipidemia, unspecified: Secondary | ICD-10-CM

## 2011-05-13 DIAGNOSIS — E119 Type 2 diabetes mellitus without complications: Secondary | ICD-10-CM

## 2011-05-13 DIAGNOSIS — I10 Essential (primary) hypertension: Secondary | ICD-10-CM

## 2011-05-13 DIAGNOSIS — N259 Disorder resulting from impaired renal tubular function, unspecified: Secondary | ICD-10-CM

## 2011-05-13 DIAGNOSIS — K219 Gastro-esophageal reflux disease without esophagitis: Secondary | ICD-10-CM

## 2011-05-13 DIAGNOSIS — Z23 Encounter for immunization: Secondary | ICD-10-CM

## 2011-05-13 LAB — CBC WITH DIFFERENTIAL/PLATELET
Basophils Relative: 0.8 % (ref 0.0–3.0)
Eosinophils Absolute: 0.2 10*3/uL (ref 0.0–0.7)
HCT: 44.5 % (ref 39.0–52.0)
Hemoglobin: 15.1 g/dL (ref 13.0–17.0)
MCHC: 33.9 g/dL (ref 30.0–36.0)
MCV: 93.9 fl (ref 78.0–100.0)
Monocytes Absolute: 0.8 10*3/uL (ref 0.1–1.0)
Neutro Abs: 4.7 10*3/uL (ref 1.4–7.7)
RBC: 4.74 Mil/uL (ref 4.22–5.81)

## 2011-05-13 LAB — COMPREHENSIVE METABOLIC PANEL
Albumin: 4.1 g/dL (ref 3.5–5.2)
Alkaline Phosphatase: 64 U/L (ref 39–117)
BUN: 22 mg/dL (ref 6–23)
Glucose, Bld: 117 mg/dL — ABNORMAL HIGH (ref 70–99)
Potassium: 4.1 mEq/L (ref 3.5–5.1)
Total Bilirubin: 0.9 mg/dL (ref 0.3–1.2)

## 2011-05-13 LAB — URINALYSIS, ROUTINE W REFLEX MICROSCOPIC
Bilirubin Urine: NEGATIVE
Leukocytes, UA: NEGATIVE
Nitrite: NEGATIVE
Urobilinogen, UA: 0.2 (ref 0.0–1.0)
pH: 6 (ref 5.0–8.0)

## 2011-05-13 LAB — TSH: TSH: 2.08 u[IU]/mL (ref 0.35–5.50)

## 2011-05-13 LAB — LIPID PANEL: Cholesterol: 183 mg/dL (ref 0–200)

## 2011-05-13 NOTE — Assessment & Plan Note (Signed)
He has no worrisome s/s so I will follow for now

## 2011-05-13 NOTE — Assessment & Plan Note (Signed)
I will check his A1C today and monitor his renal function 

## 2011-05-13 NOTE — Patient Instructions (Signed)
Diabetes, Type 2 Diabetes is a lasting (chronic) disease. In type 2 diabetes, the pancreas does not make enough insulin (a hormone), and the body does not respond normally to the insulin that is made. This type of diabetes was also previously called adult onset diabetes. About 90% of all those who have diabetes have type 2. It usually occurs after the age of 40 but can occur at any age. CAUSES Unlike type 1 diabetes, which happens because insulin is no longer being made, type 2 diabetes happens because the body is making less insulin and has trouble using the insulin properly. SYMPTOMS  Drinking more than usual.   Urinating more than usual.   Blurred vision.   Dry, itchy skin.   Frequent infection like yeast infections in women.   More tired than usual (fatigue).  TREATMENT  Healthy eating.   Exercise.   Medication, if needed.   Monitoring blood glucose (sugar).   Seeing your caregiver regularly.  HOME CARE INSTRUCTIONS  Check your blood glucose (sugar) at least once daily. More frequent monitoring may be necessary, depending on your medications and on how well your diabetes is controlled. Your caregiver will advise you.   Take your medicine as directed by your caregiver.   Do not smoke.   Make wise food choices. Ask your caregiver for information. Weight loss can improve your diabetes.   Learn about low blood glucose (hypoglycemia) and how to treat it.   Get your eyes checked regularly.   Have a yearly physical exam. Have your blood pressure checked. Get your blood and urine tested.   Wear a pendant or bracelet saying that you have diabetes.   Check your feet every night for sores. Let your caregiver know if you have sores that are not healing.  SEEK MEDICAL CARE IF:  You are having problems keeping your blood glucose at target range.   You feel you might be having problems with your medicines.   You have symptoms of an illness that is not improving after 24  hours.   You have a sore or wound that is not healing.   You notice a change in vision or a new problem with your vision.  You develop a fever of more than 100.5Hypertension (High Blood Pressure) As your heart beats, it forces blood through your arteries. This force is your blood pressure. If the pressure is too high, it is called hypertension (HTN) or high blood pressure. HTN is dangerous because you may have it and not know it. High blood pressure may mean that your heart has to work harder to pump blood. Your arteries may be narrow or stiff. The extra work puts you at risk for heart disease, stroke, and other problems.  Blood pressure consists of two numbers, a higher number over a lower, 110/72, for example. It is stated as "110 over 72." The ideal is below 120 for the top number (systolic) and under 80 for the bottom (diastolic). Write down your blood pressure today. You should pay close attention to your blood pressure if you have certain conditions such as: Heart failure. Prior heart attack.  Diabetes  Chronic kidney disease.  Prior stroke.  Multiple risk factors for heart disease.   To see if you have HTN, your blood pressure should be measured while you are seated with your arm held at the level of the heart. It should be measured at least twice. A one-time elevated blood pressure reading (especially in the Emergency Department) does not mean   that you need treatment. There may be conditions in which the blood pressure is different between your right and left arms. It is important to see your caregiver soon for a recheck. Most people have essential hypertension which means that there is not a specific cause. This type of high blood pressure may be lowered by changing lifestyle factors such as: Stress. Smoking.  Lack of exercise.  Excessive weight. Drug/tobacco/alcohol use.  Eating less salt.   Most people do not have symptoms from high blood pressure until it has caused damage to the  body. Effective treatment can often prevent, delay or reduce that damage. TREATMENT Treatment for high blood pressure, when a cause has been identified, is directed at the cause. There are a large number of medications to treat HTN. These fall into several categories, and your caregiver will help you select the medicines that are best for you. Medications may have side effects. You should review side effects with your caregiver. If your blood pressure stays high after you have made lifestyle changes or started on medicines,  Your medication(s) may need to be changed.  Other problems may need to be addressed.  Be certain you understand your prescriptions, and know how and when to take your medicine.  Be sure to follow up with your caregiver within the time frame advised (usually within two weeks) to have your blood pressure rechecked and to review your medications.  If you are taking more than one medicine to lower your blood pressure, make sure you know how and at what times they should be taken. Taking two medicines at the same time can result in blood pressure that is too low.  SEEK IMMEDIATE MEDICAL CARE IF YOU DEVELOP: A severe headache, blurred or changing vision, or confusion.  Unusual weakness or numbness, or a faint feeling.  Severe chest or abdominal pain, vomiting, or breathing problems.  MAKE SURE YOU:  Understand these instructions.  Will watch your condition.  Will get help right away if you are not doing well or get worse.  Document Released: 11/16/2005 Document Re-Released: 05/06/2010  [employer] Patient Information 2011 [employer]  Document Released: 11/16/2005 Document Re-Released: 12/08/2009 [employer] Patient Information 2011 [employer]. 

## 2011-05-13 NOTE — Assessment & Plan Note (Signed)
He is doing well on statin therapy

## 2011-05-13 NOTE — Progress Notes (Signed)
Subjective:    Patient ID: Ryan Duke, male    DOB: [birth year], 70 y.o.   MRN: [id number]  Diabetes He presents for his follow-up diabetic visit. He has type 2 diabetes mellitus. His disease course has been improving. There are no hypoglycemic associated symptoms. Pertinent negatives for hypoglycemia include no dizziness, headaches, pallor, seizures, speech difficulty, sweats or tremors. Pertinent negatives for diabetes include no blurred vision, no chest pain, no fatigue, no foot paresthesias, no foot ulcerations, no polydipsia, no polyphagia, no polyuria, no visual change and no weakness. There are no hypoglycemic complications. Symptoms are stable. Current diabetic treatment includes oral agent (monotherapy). He is compliant with treatment all of the time. His weight is increasing steadily. He is following a generally healthy diet. Meal planning includes avoidance of concentrated sweets. He has not had a previous visit with a dietician. He participates in exercise intermittently. There is no change in his home blood glucose trend. His breakfast blood glucose range is generally 90-110 mg/dl. His lunch blood glucose range is generally 110-130 mg/dl. His dinner blood glucose range is generally 110-130 mg/dl. His highest blood glucose is 110-130 mg/dl. His overall blood glucose range is 90-110 mg/dl. An ACE inhibitor/angiotensin II receptor blocker is being taken. He does not see a podiatrist.Eye exam is current.  Hypertension This is a chronic problem. The current episode started more than 1 year ago. The problem has been gradually improving since onset. The problem is controlled. Associated symptoms include peripheral edema. Pertinent negatives include no anxiety, blurred vision, chest pain, headaches, neck pain, orthopnea, palpitations, PND, shortness of breath or sweats. There are no associated agents to hypertension. Past treatments include beta blockers, diuretics and angiotensin blockers. The  current treatment provides significant improvement. Compliance problems include diet and exercise.       Review of Systems  Constitutional: Negative for fever, chills, diaphoresis, activity change, appetite change, fatigue and unexpected weight change.  HENT: Negative for facial swelling and neck pain.   Eyes: Negative for blurred vision, photophobia and visual disturbance.  Respiratory: Negative for apnea, cough, choking, chest tightness, shortness of breath, wheezing and stridor.   Cardiovascular: Positive for leg swelling. Negative for chest pain, palpitations, orthopnea and PND.  Gastrointestinal: Negative for nausea, vomiting, abdominal pain, diarrhea and constipation.  Genitourinary: Negative.  Negative for polyuria.  Musculoskeletal: Negative for myalgias, back pain, joint swelling, arthralgias and gait problem.  Skin: Negative for color change, pallor and rash.  Neurological: Negative for dizziness, tremors, seizures, syncope, facial asymmetry, speech difficulty, weakness, light-headedness, numbness and headaches.  Hematological: Negative for polydipsia, polyphagia and adenopathy. Does not bruise/bleed easily.  Psychiatric/Behavioral: Negative.        Objective:   Physical Exam  Vitals reviewed. Constitutional: He is oriented to person, place, and time. He appears well-developed and well-nourished. No distress.  HENT:  Head: Normocephalic and atraumatic.  Right Ear: External ear normal.  Left Ear: External ear normal.  Nose: Nose normal.  Mouth/Throat: Oropharynx is clear and moist. No oropharyngeal exudate.  Eyes: Conjunctivae and EOM are normal. Pupils are equal, round, and reactive to light. Right eye exhibits no discharge. Left eye exhibits no discharge. No scleral icterus.  Neck: Normal range of motion. Neck supple. No JVD present. No tracheal deviation present. No thyromegaly present.  Cardiovascular: Normal rate, regular rhythm, S1 normal, S2 normal and intact distal  pulses.   No extrasystoles are present. Exam reveals no gallop, no S3, no S4 and no friction rub.   Murmur heard.  Decrescendo  systolic murmur is present with a grade of 1/6   No diastolic murmur is present  Pulses:      Carotid pulses are 1+ on the right side, and 1+ on the left side.      Radial pulses are 1+ on the right side, and 1+ on the left side.       Femoral pulses are 1+ on the right side.      Popliteal pulses are 1+ on the right side, and 1+ on the left side.       Dorsalis pedis pulses are 1+ on the right side, and 1+ on the left side.       Posterior tibial pulses are 1+ on the right side, and 1+ on the left side.  Pulmonary/Chest: Effort normal and breath sounds normal. No stridor. No respiratory distress. He has no wheezes. He has no rales. He exhibits no tenderness.  Abdominal: Soft. Bowel sounds are normal. He exhibits no distension and no mass. There is no tenderness. There is no rebound and no guarding.  Musculoskeletal: Normal range of motion. He exhibits edema (trace edema in both legs). He exhibits no tenderness.  Lymphadenopathy:    He has no cervical adenopathy.  Neurological: He is alert and oriented to person, place, and time. He has normal reflexes. He displays normal reflexes. No cranial nerve deficit. He exhibits normal muscle tone. Coordination normal.  Skin: Skin is warm and dry. No rash noted. He is not diaphoretic. No erythema. No pallor.  Psychiatric: He has a normal mood and affect. His behavior is normal. Judgment and thought content normal.         Lab Results  Component Value Date   WBC 6.8 01/12/2011   HGB 15.8 01/12/2011   HCT 45.8 01/12/2011   PLT 246.0 01/12/2011   CHOL 183 09/08/2010   TRIG 136.0 09/08/2010   HDL 38.80* 09/08/2010   LDLDIRECT 100.0 06/13/2009   ALT 33 01/12/2011   AST 24 01/12/2011   NA 139 01/12/2011   K 4.2 01/12/2011   CL 97 01/12/2011   CREATININE 1.3 01/12/2011   BUN 21 01/12/2011   CO2 29 01/12/2011   TSH 2.62 01/12/2011     PSA 0.76 09/08/2010   HGBA1C 7.1* 01/12/2011   Assessment & Plan:

## 2011-05-13 NOTE — Assessment & Plan Note (Signed)
His BP is well controlled 

## 2011-05-13 NOTE — Assessment & Plan Note (Signed)
I will monitor his renal function today 

## 2011-05-13 NOTE — Assessment & Plan Note (Signed)
This is much improved on Dexilant

## 2011-05-28 ENCOUNTER — Telehealth: Payer: Self-pay

## 2011-05-28 NOTE — Telephone Encounter (Signed)
Called patient to advise shingles vaccine avail. Patient is aware to collect $40 at visit

## 2011-05-29 ENCOUNTER — Ambulatory Visit (INDEPENDENT_AMBULATORY_CARE_PROVIDER_SITE_OTHER): Payer: Medicare Other | Admitting: *Deleted

## 2011-05-29 DIAGNOSIS — Z2911 Encounter for prophylactic immunotherapy for respiratory syncytial virus (RSV): Secondary | ICD-10-CM

## 2011-05-29 DIAGNOSIS — Z23 Encounter for immunization: Secondary | ICD-10-CM

## 2011-05-29 NOTE — Progress Notes (Signed)
Processed claimed through [facility].

## 2011-07-23 ENCOUNTER — Telehealth: Payer: Self-pay

## 2011-07-23 MED ORDER — ATENOLOL-CHLORTHALIDONE 100-25 MG PO TABS: 1.0000 | ORAL_TABLET | Freq: Every day | ORAL | Status: DC

## 2011-08-10 NOTE — Telephone Encounter (Signed)
Med refill

## 2011-08-12 ENCOUNTER — Telehealth: Payer: Self-pay

## 2011-08-12 NOTE — Telephone Encounter (Signed)
Patient called stating that he was previously advised the insurance would cover all except $40 for shingles vaccine. Patient received a bill for over 200$. I advised that I would relay to billing/mang to see about adjustment. Per Supervisor patient was incorrectly charged and $254 would be adjusted. Returned call to patient/lmovm advising the above

## 2011-09-16 ENCOUNTER — Other Ambulatory Visit (INDEPENDENT_AMBULATORY_CARE_PROVIDER_SITE_OTHER): Payer: Medicare Other

## 2011-09-16 ENCOUNTER — Encounter: Payer: Self-pay | Admitting: Internal Medicine

## 2011-09-16 ENCOUNTER — Ambulatory Visit (INDEPENDENT_AMBULATORY_CARE_PROVIDER_SITE_OTHER): Payer: Medicare Other | Admitting: Internal Medicine

## 2011-09-16 VITALS — BP 136/84 | HR 67 | Temp 98.8°F | Resp 16 | Wt 255.0 lb

## 2011-09-16 DIAGNOSIS — E785 Hyperlipidemia, unspecified: Secondary | ICD-10-CM

## 2011-09-16 DIAGNOSIS — N401 Enlarged prostate with lower urinary tract symptoms: Secondary | ICD-10-CM

## 2011-09-16 DIAGNOSIS — I10 Essential (primary) hypertension: Secondary | ICD-10-CM

## 2011-09-16 DIAGNOSIS — N259 Disorder resulting from impaired renal tubular function, unspecified: Secondary | ICD-10-CM

## 2011-09-16 DIAGNOSIS — E119 Type 2 diabetes mellitus without complications: Secondary | ICD-10-CM

## 2011-09-16 DIAGNOSIS — Z125 Encounter for screening for malignant neoplasm of prostate: Secondary | ICD-10-CM

## 2011-09-16 DIAGNOSIS — I359 Nonrheumatic aortic valve disorder, unspecified: Secondary | ICD-10-CM

## 2011-09-16 DIAGNOSIS — Z23 Encounter for immunization: Secondary | ICD-10-CM

## 2011-09-16 DIAGNOSIS — Z0000 Encounter for general adult medical examination without abnormal findings: Secondary | ICD-10-CM

## 2011-09-16 LAB — CBC WITH DIFFERENTIAL/PLATELET
Basophils Absolute: 0 10*3/uL (ref 0.0–0.1)
Basophils Relative: 0.5 % (ref 0.0–3.0)
Eosinophils Relative: 3.6 % (ref 0.0–5.0)
HCT: 46.1 % (ref 39.0–52.0)
Hemoglobin: 15.5 g/dL (ref 13.0–17.0)
Lymphocytes Relative: 23 % (ref 12.0–46.0)
Lymphs Abs: 1.6 10*3/uL (ref 0.7–4.0)
Monocytes Relative: 9.1 % (ref 3.0–12.0)
Neutro Abs: 4.5 10*3/uL (ref 1.4–7.7)
RBC: 4.86 Mil/uL (ref 4.22–5.81)
RDW: 13 % (ref 11.5–14.6)

## 2011-09-16 LAB — COMPREHENSIVE METABOLIC PANEL
AST: 25 U/L (ref 0–37)
Albumin: 4.1 g/dL (ref 3.5–5.2)
BUN: 15 mg/dL (ref 6–23)
CO2: 29 mEq/L (ref 19–32)
Calcium: 9.4 mg/dL (ref 8.4–10.5)
Chloride: 101 mEq/L (ref 96–112)
Creatinine, Ser: 1.4 mg/dL (ref 0.4–1.5)
GFR: 54.2 mL/min — ABNORMAL LOW (ref >60.00)
Glucose, Bld: 156 mg/dL — ABNORMAL HIGH (ref 70–99)
Potassium: 3.8 mEq/L (ref 3.5–5.1)

## 2011-09-16 LAB — URINALYSIS, ROUTINE W REFLEX MICROSCOPIC
Hgb urine dipstick: NEGATIVE
Nitrite: NEGATIVE
Specific Gravity, Urine: 1.02 (ref 1.000–1.030)
Urobilinogen, UA: 0.2 (ref 0.0–1.0)

## 2011-09-16 LAB — LIPID PANEL
LDL Cholesterol: 94 mg/dL (ref 0–99)
Total CHOL/HDL Ratio: 3
Triglycerides: 97 mg/dL (ref 0.0–149.0)

## 2011-09-16 LAB — TSH: TSH: 2.14 u[IU]/mL (ref 0.35–5.50)

## 2011-09-16 NOTE — Progress Notes (Signed)
Subjective:    Patient ID: Ryan Duke, male    DOB: 1941/11/05, 70 y.o.   MRN: [id number]  Diabetes He presents for his follow-up diabetic visit. He has type 2 diabetes mellitus. His disease course has been stable. There are no hypoglycemic associated symptoms. Pertinent negatives for hypoglycemia include no dizziness, headaches, pallor, seizures, speech difficulty or tremors. Pertinent negatives for diabetes include no blurred vision, no chest pain, no fatigue, no foot paresthesias, no foot ulcerations, no polydipsia, no polyphagia, no polyuria, no visual change, no weakness and no weight loss. There are no hypoglycemic complications. Symptoms are stable. There are no diabetic complications. Current diabetic treatment includes oral agent (monotherapy). He is compliant with treatment all of the time. His weight is increasing steadily. He is following a generally unhealthy diet. He has not had a previous visit with a dietician. He never participates in exercise. There is no change in his home blood glucose trend. An ACE inhibitor/angiotensin II receptor blocker is being taken. He does not see a podiatrist.Eye exam is current.      Review of Systems  Constitutional: Negative for fever, chills, weight loss, diaphoresis, activity change, appetite change, fatigue and unexpected weight change.  HENT: Negative.   Eyes: Negative.  Negative for blurred vision.  Respiratory: Negative for apnea, cough, choking, chest tightness, shortness of breath, wheezing and stridor.   Cardiovascular: Negative for chest pain, palpitations and leg swelling.  Gastrointestinal: Negative for nausea, vomiting, abdominal pain, diarrhea, constipation, blood in stool, abdominal distention and anal bleeding.  Genitourinary: Negative.  Negative for polyuria.  Musculoskeletal: Negative for myalgias, back pain, joint swelling, arthralgias and gait problem.  Skin: Negative for color change, pallor, rash and wound.  Neurological:  Negative for dizziness, tremors, seizures, syncope, facial asymmetry, speech difficulty, weakness, light-headedness, numbness and headaches.  Hematological: Negative for polydipsia, polyphagia and adenopathy. Does not bruise/bleed easily.  Psychiatric/Behavioral: Negative.        Objective:   Physical Exam  Vitals reviewed. Constitutional: He is oriented to person, place, and time. He appears well-developed and well-nourished. No distress.  HENT:  Head: Normocephalic and atraumatic.  Mouth/Throat: Oropharynx is clear and moist. No oropharyngeal exudate.  Eyes: Conjunctivae are normal. Right eye exhibits no discharge. Left eye exhibits no discharge. No scleral icterus.  Neck: Normal range of motion. Neck supple. No JVD present. No tracheal deviation present. No thyromegaly present.  Cardiovascular: Normal rate, regular rhythm, S1 normal, S2 normal and intact distal pulses.  Exam reveals no gallop, no S3, no S4, no distant heart sounds and no friction rub.   Murmur heard.  Decrescendo systolic murmur is present with a grade of 1/6   No diastolic murmur is present  Pulses:      Carotid pulses are 1+ on the right side, and 1+ on the left side.      Radial pulses are 1+ on the right side, and 1+ on the left side.       Femoral pulses are 1+ on the right side, and 1+ on the left side.      Popliteal pulses are 1+ on the right side, and 1+ on the left side.       Dorsalis pedis pulses are 1+ on the right side, and 1+ on the left side.       Posterior tibial pulses are 1+ on the right side, and 1+ on the left side.  Pulmonary/Chest: Effort normal and breath sounds normal. No stridor. No respiratory distress. He has no wheezes.  He has no rales. He exhibits no tenderness.  Abdominal: Soft. Bowel sounds are normal. He exhibits no distension and no mass. There is no tenderness. There is no rebound and no guarding. Hernia confirmed negative in the right inguinal area and confirmed negative in the left  inguinal area.  Genitourinary: Testes normal and penis normal. Rectal exam shows internal hemorrhoid. Rectal exam shows no external hemorrhoid, no fissure, no mass, no tenderness and anal tone normal. Guaiac negative stool. 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 Prostate is not tender. Right testis shows no mass, no swelling and no tenderness. Right testis is descended. Left testis shows no mass, no swelling and no tenderness. Left testis is descended. Circumcised. No penile tenderness. No discharge found.  Musculoskeletal: Normal range of motion. He exhibits edema (trace edema in both legs). He exhibits no tenderness.  Lymphadenopathy:    He has no cervical adenopathy.       Right: No inguinal adenopathy present.       Left: No inguinal adenopathy present.  Neurological: He is oriented to person, place, and time. He displays normal reflexes. No cranial nerve deficit. He exhibits normal muscle tone. Coordination normal.  Skin: Skin is warm and dry. No rash noted. He is not diaphoretic. No erythema. No pallor.  Psychiatric: He has a normal mood and affect. His behavior is normal. Judgment and thought content normal.      Lab Results  Component Value Date   WBC 7.4 05/13/2011   HGB 15.1 05/13/2011   HCT 44.5 05/13/2011   PLT 265.0 05/13/2011   GLUCOSE 117* 05/13/2011   CHOL 183 05/13/2011   TRIG 162.0* 05/13/2011   HDL 54.20 05/13/2011   LDLDIRECT 100.0 06/13/2009   LDLCALC 96 05/13/2011   ALT 25 05/13/2011   AST 20 05/13/2011   NA 142 05/13/2011   K 4.1 05/13/2011   CL 107 05/13/2011   CREATININE 1.6* 05/13/2011   BUN 22 05/13/2011   CO2 29 05/13/2011   TSH 2.08 05/13/2011   PSA 0.76 09/08/2010   HGBA1C 6.8* 05/13/2011      Assessment & Plan:

## 2011-09-16 NOTE — Patient Instructions (Signed)
Health Maintenance in Males MAINTAIN REGULAR HEALTH EXAMS  Maintain a healthy diet and normal weight. Increased weight leads to problems with blood pressure and diabetes. Decrease fat in the diet and increase exercise. Obtain a proper diet from your caregiver if necessary.   High blood pressure causes heart and blood vessel problems. Check blood pressures regularly and keep your blood pressure at normal limits. Aerobic exercise helps this. Persistent elevations of blood pressure should be treated with medications if weight loss and exercise are ineffective.   Avoid smoking, drinking in excess (more than 2 drinks per day), or use of street drugs. Do not share needles with anyone. Ask for help if you need assistance or instructions on stopping the use of alcohol, cigarettes, or drugs.   Maintain normal blood lipids and cholesterol. Your caregiver can give you information to lower your risk of heart disease or stroke.   Ask your caregiver if you are in need of early heart disease screening because of a strong family history of heart disease or signs of elevated testosterone (male sex hormone) levels. These can predispose you to early heart disease.   Practice safe sex. Practicing safe sex decreases your risk for a sexually transmitted infection (STI). Some of the STIs are gonorrhea, chlamydia, syphilis, trichimonas, herpes, human papillomavirus (HPV), and human immunodeficiency virus (HIV). Herpes, HIV, and HPV are viral illnesses that have no cure. These can result in disability, cancer, and death.   It is not safe for someone who has AIDS or is HIV positive to have unprotected sex with a partner who is HIV positive. The reason for this is the fact that there are many different strains of HIV. If you have a strain that is readily treated with medications and then suddenly introduce a strain from a partner that has no further treatment options, you may suddenly have a strain of HIV that is untreatable.  Even if you are both positive for HIV, it is still necessary to practice safe sex.   Use sunscreen with a SPF of 15 or greater. Being outside in the sun when your shadow caused by the sun is shorter than you are, means you are being exposed to sun at greater intensity. Lighter skinned people are at a greater risk of skin cancer.   Keep carbon monoxide and smoke detectors in your home and functioning at all times. Change the batteries every 6 months.   Do monthly examinations of your testicles. The best time to do this is after a hot shower or bath when the tissues are loose. Notify your caregivers of any lumps, tenderness, or changes in size or shape.   Notify your caregiver of new moles or changes in moles, especially if there is a change in shape or color. Also notify your caregiver if a mole is larger than the size of a pencil eraser.   Stay current with your tetanus shots and other required immunizations.  The Body Mass Index (BMI) is a way of measuring how much of your body is fat. Having a BMI above 27 increases the risk of heart disease, diabetes, hypertension, stroke, and other problems related to obesity. Document Released: 05/14/2008 Document Re-Released: 05/06/2010 [employer] Patient Information 2011 [employer]. 

## 2011-09-18 DIAGNOSIS — Z0000 Encounter for general adult medical examination without abnormal findings: Secondary | ICD-10-CM | POA: Insufficient documentation

## 2011-09-18 NOTE — Assessment & Plan Note (Signed)
He is due for a PSA.

## 2011-09-18 NOTE — Assessment & Plan Note (Signed)
This is stable 

## 2011-09-18 NOTE — Assessment & Plan Note (Signed)
He has no s/s so I will not pursue this any further at this time

## 2011-09-18 NOTE — Assessment & Plan Note (Signed)
His BP is well controlled, I will check his lytes and renal function 

## 2011-09-18 NOTE — Assessment & Plan Note (Signed)

## 2011-10-16 ENCOUNTER — Other Ambulatory Visit: Payer: Self-pay | Admitting: Internal Medicine

## 2012-01-18 ENCOUNTER — Ambulatory Visit: Payer: Medicare Other | Admitting: Internal Medicine

## 2012-01-21 ENCOUNTER — Ambulatory Visit: Payer: Medicare Other | Admitting: Internal Medicine

## 2012-02-03 ENCOUNTER — Other Ambulatory Visit (INDEPENDENT_AMBULATORY_CARE_PROVIDER_SITE_OTHER): Payer: Medicare Other

## 2012-02-03 ENCOUNTER — Ambulatory Visit (INDEPENDENT_AMBULATORY_CARE_PROVIDER_SITE_OTHER): Payer: Medicare Other | Admitting: Internal Medicine

## 2012-02-03 ENCOUNTER — Encounter: Payer: Self-pay | Admitting: Internal Medicine

## 2012-02-03 VITALS — BP 134/88 | HR 64 | Temp 98.3°F | Resp 16 | Wt 253.0 lb

## 2012-02-03 DIAGNOSIS — E785 Hyperlipidemia, unspecified: Secondary | ICD-10-CM

## 2012-02-03 DIAGNOSIS — I10 Essential (primary) hypertension: Secondary | ICD-10-CM

## 2012-02-03 DIAGNOSIS — E119 Type 2 diabetes mellitus without complications: Secondary | ICD-10-CM

## 2012-02-03 DIAGNOSIS — I359 Nonrheumatic aortic valve disorder, unspecified: Secondary | ICD-10-CM

## 2012-02-03 DIAGNOSIS — N259 Disorder resulting from impaired renal tubular function, unspecified: Secondary | ICD-10-CM

## 2012-02-03 LAB — COMPREHENSIVE METABOLIC PANEL
CO2: 29 mEq/L (ref 19–32)
Creatinine, Ser: 1.4 mg/dL (ref 0.4–1.5)
GFR: 53.25 mL/min — ABNORMAL LOW (ref >60.00)
Glucose, Bld: 138 mg/dL — ABNORMAL HIGH (ref 70–99)
Total Bilirubin: 0.5 mg/dL (ref 0.3–1.2)

## 2012-02-03 LAB — LIPID PANEL: VLDL: 19.8 mg/dL (ref 0.0–40.0)

## 2012-02-03 LAB — CBC WITH DIFFERENTIAL/PLATELET
Eosinophils Relative: 4.6 % (ref 0.0–5.0)
HCT: 47.5 % (ref 39.0–52.0)
Hemoglobin: 16 g/dL (ref 13.0–17.0)
Lymphs Abs: 1.4 10*3/uL (ref 0.7–4.0)
Monocytes Relative: 8.7 % (ref 3.0–12.0)
Neutro Abs: 4 10*3/uL (ref 1.4–7.7)
Platelets: 244 10*3/uL (ref 150.0–400.0)
RBC: 5.04 Mil/uL (ref 4.22–5.81)
WBC: 6.4 10*3/uL (ref 4.5–10.5)

## 2012-02-03 LAB — HEMOGLOBIN A1C: Hgb A1c MFr Bld: 7 % — ABNORMAL HIGH (ref 4.6–6.5)

## 2012-02-03 NOTE — Progress Notes (Signed)
Subjective:    Patient ID: Ryan Duke, male    DOB: [birth year], 71 y.o.   MRN: [id number]  Diabetes He presents for his follow-up diabetic visit. He has type 2 diabetes mellitus. His disease course has been improving. There are no hypoglycemic associated symptoms. Pertinent negatives for hypoglycemia include no dizziness, headaches, pallor, seizures, speech difficulty or tremors. Pertinent negatives for diabetes include no blurred vision, no chest pain, no fatigue, no foot paresthesias, no foot ulcerations, no polydipsia, no polyphagia, no polyuria, no visual change, no weakness and no weight loss. There are no hypoglycemic complications. There are no diabetic complications. Current diabetic treatment includes oral agent (monotherapy). He is compliant with treatment all of the time. His weight is stable. He is following a generally unhealthy diet. He has not had a previous visit with a dietician. He never participates in exercise. There is no change in his home blood glucose trend. An ACE inhibitor/angiotensin II receptor blocker is being taken. He does not see a podiatrist.Eye exam is current.      Review of Systems  Constitutional: Negative for fever, chills, weight loss, diaphoresis, activity change, appetite change, fatigue and unexpected weight change.  HENT: Negative.   Eyes: Negative.  Negative for blurred vision.  Respiratory: Negative for apnea, cough, choking, chest tightness, shortness of breath, wheezing and stridor.   Cardiovascular: Negative for chest pain, palpitations and leg swelling.  Gastrointestinal: Negative for nausea, vomiting, abdominal pain, diarrhea, constipation and anal bleeding.  Genitourinary: Negative.  Negative for polyuria.  Musculoskeletal: Negative for myalgias, back pain, joint swelling, arthralgias and gait problem.  Skin: Negative for color change, pallor, rash and wound.  Neurological: Negative for dizziness, tremors, seizures, facial asymmetry, speech  difficulty, weakness, light-headedness, numbness and headaches.  Hematological: Negative for polydipsia, polyphagia and adenopathy. Does not bruise/bleed easily.  Psychiatric/Behavioral: Negative.        Objective:   Physical Exam  Vitals reviewed. Constitutional: He is oriented to person, place, and time. He appears well-developed and well-nourished. No distress.  HENT:  Head: Normocephalic and atraumatic.  Mouth/Throat: Oropharynx is clear and moist. No oropharyngeal exudate.  Eyes: Conjunctivae are normal. Right eye exhibits no discharge. Left eye exhibits no discharge. No scleral icterus.  Neck: Normal range of motion. Neck supple. No JVD present. No tracheal deviation present. No thyromegaly present.  Cardiovascular: S1 normal, S2 normal and normal pulses.   No extrasystoles are present. Bradycardia present.  PMI is not displaced.  Exam reveals no S3 and no S4.   Murmur heard.  Decrescendo systolic murmur is present with a grade of 2/6   No diastolic murmur is present  Pulmonary/Chest: Effort normal and breath sounds normal. No stridor. No respiratory distress. He has no wheezes. He has no rales. He exhibits no tenderness.  Abdominal: Soft. Bowel sounds are normal. He exhibits no distension and no mass. There is no tenderness. There is no rebound and no guarding.  Musculoskeletal: Normal range of motion. He exhibits edema (trace edema in both le's). He exhibits no tenderness.  Lymphadenopathy:    He has no cervical adenopathy.  Neurological: He is oriented to person, place, and time.  Skin: Skin is warm and dry. No rash noted. He is not diaphoretic. No erythema. No pallor.  Psychiatric: He has a normal mood and affect. His behavior is normal. Judgment and thought content normal.      Lab Results  Component Value Date   WBC 7.1 09/16/2011   HGB 15.5 09/16/2011   HCT 46.1  09/16/2011   PLT 258.0 09/16/2011   GLUCOSE 156* 09/16/2011   CHOL 166 09/16/2011   TRIG 97.0 09/16/2011    HDL 53.00 09/16/2011   LDLDIRECT 100.0 06/13/2009   LDLCALC 94 09/16/2011   ALT 35 09/16/2011   AST 25 09/16/2011   NA 138 09/16/2011   K 3.8 09/16/2011   CL 101 09/16/2011   CREATININE 1.4 09/16/2011   BUN 15 09/16/2011   CO2 29 09/16/2011   TSH 2.14 09/16/2011   PSA 1.43 09/16/2011   HGBA1C 7.0* 09/16/2011      Assessment & Plan:

## 2012-02-03 NOTE — Patient Instructions (Signed)

## 2012-02-05 NOTE — Assessment & Plan Note (Signed)
His BP is well controlled 

## 2012-02-05 NOTE — Assessment & Plan Note (Signed)
I will monitor his renal function today 

## 2012-02-05 NOTE — Assessment & Plan Note (Signed)
His EKG shows sinus bradycardia/arrhythmia with some sinus variation in the rate but all of the QRS complexes are preceded by a P wave

## 2012-02-05 NOTE — Assessment & Plan Note (Signed)
Check his a1c today 

## 2012-02-05 NOTE — Assessment & Plan Note (Signed)
FLP today 

## 2012-03-09 DIAGNOSIS — IMO0002 Reserved for concepts with insufficient information to code with codable children: Secondary | ICD-10-CM | POA: Insufficient documentation

## 2012-03-09 DIAGNOSIS — H18519 Endothelial corneal dystrophy, unspecified eye: Secondary | ICD-10-CM | POA: Insufficient documentation

## 2012-03-09 LAB — HM DIABETES EYE EXAM

## 2012-03-23 ENCOUNTER — Other Ambulatory Visit: Payer: Self-pay | Admitting: Internal Medicine

## 2012-04-29 ENCOUNTER — Other Ambulatory Visit: Payer: Self-pay

## 2012-04-29 MED ORDER — LOVASTATIN 40 MG PO TABS: ORAL_TABLET | ORAL | Status: DC

## 2012-06-03 ENCOUNTER — Ambulatory Visit: Payer: Medicare Other | Admitting: Internal Medicine

## 2012-06-10 ENCOUNTER — Ambulatory Visit (INDEPENDENT_AMBULATORY_CARE_PROVIDER_SITE_OTHER): Payer: Medicare Other | Admitting: Internal Medicine

## 2012-06-10 ENCOUNTER — Encounter: Payer: Self-pay | Admitting: Internal Medicine

## 2012-06-10 ENCOUNTER — Other Ambulatory Visit (INDEPENDENT_AMBULATORY_CARE_PROVIDER_SITE_OTHER): Payer: Medicare Other

## 2012-06-10 VITALS — BP 130/82 | HR 57 | Temp 97.8°F | Resp 16 | Wt 240.0 lb

## 2012-06-10 DIAGNOSIS — E785 Hyperlipidemia, unspecified: Secondary | ICD-10-CM

## 2012-06-10 DIAGNOSIS — IMO0001 Reserved for inherently not codable concepts without codable children: Secondary | ICD-10-CM

## 2012-06-10 DIAGNOSIS — N259 Disorder resulting from impaired renal tubular function, unspecified: Secondary | ICD-10-CM

## 2012-06-10 DIAGNOSIS — I10 Essential (primary) hypertension: Secondary | ICD-10-CM

## 2012-06-10 LAB — COMPREHENSIVE METABOLIC PANEL
AST: 25 U/L (ref 0–37)
Albumin: 4.3 g/dL (ref 3.5–5.2)
Alkaline Phosphatase: 62 U/L (ref 39–117)
BUN: 20 mg/dL (ref 6–23)
Potassium: 3.8 mEq/L (ref 3.5–5.1)
Total Bilirubin: 0.8 mg/dL (ref 0.3–1.2)

## 2012-06-10 LAB — CK: Total CK: 93 U/L (ref 7–232)

## 2012-06-10 MED ORDER — ROSUVASTATIN CALCIUM 10 MG PO TABS: 10.0000 mg | ORAL_TABLET | Freq: Every day | ORAL | Status: DC

## 2012-06-10 NOTE — Progress Notes (Signed)
Subjective:    Patient ID: Ryan Duke, male    DOB: 1941/08/25, 71 y.o.   MRN: [id number]  Diabetes He presents for his follow-up diabetic visit. He has type 2 diabetes mellitus. His disease course has been stable. There are no hypoglycemic associated symptoms. Pertinent negatives for hypoglycemia include no dizziness or pallor. Pertinent negatives for diabetes include no blurred vision, no chest pain, no fatigue, no foot paresthesias, no foot ulcerations, no polydipsia, no polyphagia, no polyuria, no visual change, no weakness and no weight loss. There are no hypoglycemic complications. Symptoms are stable. There are no diabetic complications. Current diabetic treatment includes oral agent (monotherapy). He is compliant with treatment all of the time. His weight is decreasing steadily. He is following a generally healthy diet. Meal planning includes avoidance of concentrated sweets. He has not had a previous visit with a dietician. He participates in exercise three times a week. There is no change in his home blood glucose trend. An ACE inhibitor/angiotensin II receptor blocker is being taken. He does not see a podiatrist.Eye exam is current.  Hyperlipidemia This is a chronic problem. The current episode started more than 1 year ago. The problem is controlled. Recent lipid tests were reviewed and are variable. Exacerbating diseases include diabetes and obesity. He has no history of chronic renal disease, hypothyroidism, liver disease or nephrotic syndrome. Factors aggravating his hyperlipidemia include thiazides, fatty foods and beta blockers. Associated symptoms include myalgias (shoulders). Pertinent negatives include no chest pain, focal sensory loss, focal weakness, leg pain or shortness of breath. Current antihyperlipidemic treatment includes statins. The current treatment provides significant improvement of lipids. Compliance problems include adherence to exercise and adherence to diet.        Review of Systems  Constitutional: Negative for fever, chills, weight loss, diaphoresis, activity change, appetite change, fatigue and unexpected weight change.  HENT: Negative.   Eyes: Negative.  Negative for blurred vision.  Respiratory: Negative for cough, chest tightness, shortness of breath, wheezing and stridor.   Cardiovascular: Negative for chest pain, palpitations and leg swelling.  Gastrointestinal: Negative for nausea, abdominal pain, diarrhea, constipation and abdominal distention.  Genitourinary: Negative.  Negative for polyuria.  Musculoskeletal: Positive for myalgias (shoulders). Negative for back pain, joint swelling, arthralgias and gait problem.  Skin: Negative for color change, pallor, rash and wound.  Neurological: Negative for dizziness, focal weakness, syncope, weakness and light-headedness.  Hematological: Negative for polydipsia, polyphagia and adenopathy. Does not bruise/bleed easily.  Psychiatric/Behavioral: Negative.        Objective:   Physical Exam  Vitals reviewed. Constitutional: He is oriented to person, place, and time. He appears well-developed and well-nourished. No distress.  HENT:  Head: Normocephalic and atraumatic.  Mouth/Throat: Oropharynx is clear and moist. No oropharyngeal exudate.  Eyes: Conjunctivae are normal. Right eye exhibits no discharge. Left eye exhibits no discharge. No scleral icterus.  Neck: Normal range of motion. Neck supple. No JVD present. No tracheal deviation present. No thyromegaly present.  Cardiovascular: Normal rate, regular rhythm, S1 normal, S2 normal and intact distal pulses.  Exam reveals no gallop, no S3, no S4 and no friction rub.   Murmur heard.  Decrescendo systolic murmur is present with a grade of 1/6   No diastolic murmur is present  Pulmonary/Chest: Effort normal and breath sounds normal. No stridor. No respiratory distress. He has no wheezes. He has no rales. He exhibits no tenderness.  Abdominal:  Soft. Bowel sounds are normal. He exhibits no distension and no mass. There is no  tenderness. There is no rebound and no guarding.  Musculoskeletal: Normal range of motion. He exhibits no edema and no tenderness.  Lymphadenopathy:    He has no cervical adenopathy.  Neurological: He is oriented to person, place, and time.  Skin: Skin is warm and dry. No rash noted. He is not diaphoretic. No erythema. No pallor.  Psychiatric: He has a normal mood and affect. His behavior is normal. Judgment and thought content normal.      Lab Results  Component Value Date   WBC 6.4 02/03/2012   HGB 16.0 02/03/2012   HCT 47.5 02/03/2012   PLT 244.0 02/03/2012   GLUCOSE 138* 02/03/2012   CHOL 176 02/03/2012   TRIG 99.0 02/03/2012   HDL 54.30 02/03/2012   LDLDIRECT 100.0 06/13/2009   LDLCALC 102* 02/03/2012   ALT 36 02/03/2012   AST 24 02/03/2012   NA 139 02/03/2012   K 4.3 02/03/2012   CL 103 02/03/2012   CREATININE 1.4 02/03/2012   BUN 16 02/03/2012   CO2 29 02/03/2012   TSH 2.14 09/16/2011   PSA 1.43 09/16/2011   HGBA1C 7.0* 02/03/2012      Assessment & Plan:

## 2012-06-10 NOTE — Patient Instructions (Signed)

## 2012-06-12 ENCOUNTER — Encounter: Payer: Self-pay | Admitting: Internal Medicine

## 2012-06-12 NOTE — Assessment & Plan Note (Signed)
He has some muscle aches so I will check his CPK level today and have changed his statin to crestor

## 2012-06-12 NOTE — Assessment & Plan Note (Signed)
His BP is well controlled, I will check his lytes and renal function 

## 2012-06-12 NOTE — Assessment & Plan Note (Signed)
I will monitor his renal function today 

## 2012-06-12 NOTE — Assessment & Plan Note (Signed)
I will check his a1c and address if needed, I will also monitor his renal function

## 2012-06-28 ENCOUNTER — Other Ambulatory Visit: Payer: Self-pay

## 2012-06-28 MED ORDER — GLIMEPIRIDE 2 MG PO TABS: ORAL_TABLET | ORAL | Status: DC

## 2012-06-28 NOTE — Telephone Encounter (Signed)
Faxed refill request   

## 2012-10-04 ENCOUNTER — Other Ambulatory Visit: Payer: Self-pay

## 2012-10-04 MED ORDER — LANCETS MISC: Status: DC

## 2012-10-04 MED ORDER — BLOOD GLUCOSE TEST VI STRP: ORAL_STRIP | Status: DC

## 2012-10-14 ENCOUNTER — Encounter: Payer: Medicare Other | Admitting: Internal Medicine

## 2012-10-17 ENCOUNTER — Encounter: Payer: Self-pay | Admitting: Internal Medicine

## 2012-10-17 ENCOUNTER — Ambulatory Visit (INDEPENDENT_AMBULATORY_CARE_PROVIDER_SITE_OTHER): Payer: Medicare Other | Admitting: Internal Medicine

## 2012-10-17 ENCOUNTER — Other Ambulatory Visit (INDEPENDENT_AMBULATORY_CARE_PROVIDER_SITE_OTHER): Payer: Medicare Other

## 2012-10-17 VITALS — BP 126/84 | HR 54 | Temp 98.1°F | Resp 16 | Ht 71.0 in | Wt 241.5 lb

## 2012-10-17 DIAGNOSIS — N259 Disorder resulting from impaired renal tubular function, unspecified: Secondary | ICD-10-CM

## 2012-10-17 DIAGNOSIS — E785 Hyperlipidemia, unspecified: Secondary | ICD-10-CM

## 2012-10-17 DIAGNOSIS — N401 Enlarged prostate with lower urinary tract symptoms: Secondary | ICD-10-CM

## 2012-10-17 DIAGNOSIS — I359 Nonrheumatic aortic valve disorder, unspecified: Secondary | ICD-10-CM

## 2012-10-17 DIAGNOSIS — I10 Essential (primary) hypertension: Secondary | ICD-10-CM

## 2012-10-17 DIAGNOSIS — Z0000 Encounter for general adult medical examination without abnormal findings: Secondary | ICD-10-CM

## 2012-10-17 DIAGNOSIS — IMO0001 Reserved for inherently not codable concepts without codable children: Secondary | ICD-10-CM

## 2012-10-17 DIAGNOSIS — N138 Other obstructive and reflux uropathy: Secondary | ICD-10-CM

## 2012-10-17 DIAGNOSIS — Z23 Encounter for immunization: Secondary | ICD-10-CM

## 2012-10-17 LAB — CBC WITH DIFFERENTIAL/PLATELET
Basophils Absolute: 0.1 10*3/uL (ref 0.0–0.1)
Basophils Relative: 0.6 % (ref 0.0–3.0)
Eosinophils Absolute: 0.2 10*3/uL (ref 0.0–0.7)
Eosinophils Relative: 2.3 % (ref 0.0–5.0)
HCT: 47.7 % (ref 39.0–52.0)
Hemoglobin: 15.9 g/dL (ref 13.0–17.0)
Lymphocytes Relative: 21.8 % (ref 12.0–46.0)
Lymphs Abs: 2.2 10*3/uL (ref 0.7–4.0)
MCHC: 33.4 g/dL (ref 30.0–36.0)
MCV: 90.6 fl (ref 78.0–100.0)
Monocytes Absolute: 0.8 10*3/uL (ref 0.1–1.0)
Monocytes Relative: 7.7 % (ref 3.0–12.0)
Neutro Abs: 6.7 10*3/uL (ref 1.4–7.7)
Neutrophils Relative %: 67.6 % (ref 43.0–77.0)
Platelets: 287 10*3/uL (ref 150.0–400.0)
RBC: 5.27 Mil/uL (ref 4.22–5.81)
RDW: 13.2 % (ref 11.5–14.6)
WBC: 9.9 10*3/uL (ref 4.5–10.5)

## 2012-10-17 LAB — COMPREHENSIVE METABOLIC PANEL WITH GFR
ALT: 25 U/L (ref 0–53)
AST: 24 U/L (ref 0–37)
Albumin: 4.4 g/dL (ref 3.5–5.2)
Alkaline Phosphatase: 67 U/L (ref 39–117)
BUN: 16 mg/dL (ref 6–23)
CO2: 29 meq/L (ref 19–32)
Calcium: 9.6 mg/dL (ref 8.4–10.5)
Chloride: 103 meq/L (ref 96–112)
Creatinine, Ser: 1.3 mg/dL (ref 0.4–1.5)
GFR: 58.45 mL/min — ABNORMAL LOW
Glucose, Bld: 86 mg/dL (ref 70–99)
Potassium: 3.7 meq/L (ref 3.5–5.1)
Sodium: 139 meq/L (ref 135–145)
Total Bilirubin: 0.7 mg/dL (ref 0.3–1.2)
Total Protein: 7.2 g/dL (ref 6.0–8.3)

## 2012-10-17 LAB — URINALYSIS, ROUTINE W REFLEX MICROSCOPIC
Hgb urine dipstick: NEGATIVE
Nitrite: NEGATIVE
Urobilinogen, UA: 0.2 (ref 0.0–1.0)

## 2012-10-17 LAB — HEMOGLOBIN A1C: Hgb A1c MFr Bld: 7 % — ABNORMAL HIGH (ref 4.6–6.5)

## 2012-10-17 LAB — HM DIABETES FOOT EXAM: HM Diabetic Foot Exam: NORMAL

## 2012-10-17 LAB — PSA: PSA: 0.71 ng/mL (ref 0.10–4.00)

## 2012-10-17 MED ORDER — SAXAGLIPTIN HCL 5 MG PO TABS: 5.0000 mg | ORAL_TABLET | Freq: Every day | ORAL | Status: DC

## 2012-10-17 NOTE — Assessment & Plan Note (Signed)
I will recheck his renal function today 

## 2012-10-17 NOTE — Progress Notes (Signed)
Subjective:    Patient ID: Ryan Duke, male    DOB: Dec 22, 1940, 71 y.o.   MRN: [id number]  Diabetes He presents for his follow-up diabetic visit. He has type 2 diabetes mellitus. His disease course has been stable. Hypoglycemia symptoms include hunger and sweats. Pertinent negatives for hypoglycemia include no dizziness, pallor or tremors. Pertinent negatives for diabetes include no blurred vision, no chest pain, no fatigue, no foot paresthesias, no foot ulcerations, no polydipsia, no polyphagia, no polyuria, no visual change, no weakness and no weight loss. There are no hypoglycemic complications. There are no diabetic complications. Current diabetic treatment includes oral agent (monotherapy). He is compliant with treatment all of the time. His weight is stable. He is following a generally unhealthy diet. When asked about meal planning, he reported none. He never participates in exercise. There is no change in his home blood glucose trend. An ACE inhibitor/angiotensin II receptor blocker is being taken. He does not see a podiatrist.Eye exam is current.      Review of Systems  Constitutional: Negative for fever, chills, weight loss, diaphoresis, activity change, appetite change, fatigue and unexpected weight change.  HENT: Negative.   Eyes: Negative.  Negative for blurred vision.  Respiratory: Negative for cough, chest tightness, shortness of breath, wheezing and stridor.   Cardiovascular: Negative for chest pain, palpitations and leg swelling.  Gastrointestinal: Negative for nausea, vomiting, abdominal pain, diarrhea, constipation, blood in stool and anal bleeding.  Genitourinary: Negative.  Negative for polyuria.  Musculoskeletal: Negative for myalgias, back pain, joint swelling, arthralgias and gait problem.  Skin: Negative for color change, pallor, rash and wound.  Neurological: Negative.  Negative for dizziness, tremors, syncope, weakness and light-headedness.  Hematological: Negative  for polydipsia, polyphagia and adenopathy. Does not bruise/bleed easily.  Psychiatric/Behavioral: Negative.        Objective:   Physical Exam  Vitals reviewed. Constitutional: He is oriented to person, place, and time. He appears well-developed and well-nourished. No distress.  HENT:  Head: Normocephalic and atraumatic.  Mouth/Throat: Oropharynx is clear and moist. No oropharyngeal exudate.  Eyes: Conjunctivae normal are normal. Right eye exhibits no discharge. Left eye exhibits no discharge. No scleral icterus.  Neck: Normal range of motion. Neck supple. No JVD present. No tracheal deviation present. No thyromegaly present.  Cardiovascular: Normal rate, regular rhythm and intact distal pulses.  Exam reveals no gallop, no distant heart sounds and no friction rub.   Murmur heard.  Decrescendo systolic murmur is present with a grade of 2/6   No diastolic murmur is present  Pulses:      Carotid pulses are 1+ on the right side, and 1+ on the left side.      Radial pulses are 1+ on the right side, and 1+ on the left side.       Femoral pulses are 1+ on the right side, and 1+ on the left side.      Popliteal pulses are 1+ on the right side, and 1+ on the left side.       Dorsalis pedis pulses are 1+ on the right side, and 1+ on the left side.       Posterior tibial pulses are 1+ on the right side, and 1+ on the left side.  Pulmonary/Chest: Effort normal and breath sounds normal. No stridor. No respiratory distress. He has no wheezes. He has no rales. He exhibits no tenderness.  Abdominal: Soft. Bowel sounds are normal. He exhibits no distension and no mass. There is no tenderness.  There is no rebound and no guarding. Hernia confirmed negative in the right inguinal area and confirmed negative in the left inguinal area.  Genitourinary: Rectum normal, prostate normal and penis normal. Rectal exam shows no external hemorrhoid, no internal hemorrhoid, no fissure, no mass, no tenderness and anal tone  normal. Guaiac negative stool. Prostate is not enlarged and not tender. Right testis shows no mass, no swelling and no tenderness. Right testis is descended. Left testis shows no mass, no swelling and no tenderness. Left testis is descended. Circumcised. No penile erythema or penile tenderness. No discharge found.  Musculoskeletal: Normal range of motion. He exhibits no edema and no tenderness.  Lymphadenopathy:    He has no cervical adenopathy.       Right: No inguinal adenopathy present.       Left: No inguinal adenopathy present.  Neurological: He is oriented to person, place, and time.  Skin: Skin is warm and dry. No rash noted. He is not diaphoretic. No erythema. No pallor.  Psychiatric: He has a normal mood and affect. His behavior is normal. Judgment and thought content normal.      Lab Results  Component Value Date   WBC 6.4 02/03/2012   HGB 16.0 02/03/2012   HCT 47.5 02/03/2012   PLT 244.0 02/03/2012   GLUCOSE 129* 06/10/2012   CHOL 176 02/03/2012   TRIG 99.0 02/03/2012   HDL 54.30 02/03/2012   LDLDIRECT 100.0 06/13/2009   LDLCALC 102* 02/03/2012   ALT 30 06/10/2012   AST 25 06/10/2012   NA 139 06/10/2012   K 3.8 06/10/2012   CL 103 06/10/2012   CREATININE 1.3 06/10/2012   BUN 20 06/10/2012   CO2 28 06/10/2012   TSH 2.14 09/16/2011   PSA 1.43 09/16/2011   HGBA1C 7.0* 06/10/2012      Assessment & Plan:

## 2012-10-17 NOTE — Assessment & Plan Note (Signed)
He has had some episodes of craving sugar, I am concerned about low blood sugar due to the SU therapy, I have asked him to change to onglyza, I will check his a1c and his renal function today

## 2012-10-17 NOTE — Assessment & Plan Note (Signed)
I will recheck his PSA 

## 2012-10-17 NOTE — Assessment & Plan Note (Signed)
Exam done Vaccines were updated Labs ordered Pt ed material was given 

## 2012-10-17 NOTE — Patient Instructions (Signed)
Health Maintenance, Males A healthy lifestyle and preventative care can promote health and wellness.  Maintain regular health, dental, and eye exams.  Eat a healthy diet. Foods like vegetables, fruits, whole grains, low-fat dairy products, and lean protein foods contain the nutrients you need without too many calories. Decrease your intake of foods high in solid fats, added sugars, and salt. Get information about a proper diet from your caregiver, if necessary.  Regular physical exercise is one of the most important things you can do for your health. Most adults should get at least 150 minutes of moderate-intensity exercise (any activity that increases your heart rate and causes you to sweat) each week. In addition, most adults need muscle-strengthening exercises on 2 or more days a week.   Maintain a healthy weight. The body mass index (BMI) is a screening tool to identify possible weight problems. It provides an estimate of body fat based on height and weight. Your caregiver can help determine your BMI, and can help you achieve or maintain a healthy weight. For adults 20 years and older:  A BMI below 18.5 is considered underweight.  A BMI of 18.5 to 24.9 is normal.  A BMI of 25 to 29.9 is considered overweight.  A BMI of 30 and above is considered obese.  Maintain normal blood lipids and cholesterol by exercising and minimizing your intake of saturated fat. Eat a balanced diet with plenty of fruits and vegetables. Blood tests for lipids and cholesterol should begin at age 20 and be repeated every 5 years. If your lipid or cholesterol levels are high, you are over 50, or you are a high risk for heart disease, you may need your cholesterol levels checked more frequently.Ongoing high lipid and cholesterol levels should be treated with medicines, if diet and exercise are not effective.  If you smoke, find out from your caregiver how to quit. If you do not use tobacco, do not start.  If you  choose to drink alcohol, do not exceed 2 drinks per day. One drink is considered to be 12 ounces (355 mL) of beer, 5 ounces (148 mL) of wine, or 1.5 ounces (44 mL) of liquor.  Avoid use of street drugs. Do not share needles with anyone. Ask for help if you need support or instructions about stopping the use of drugs.  High blood pressure causes heart disease and increases the risk of stroke. Blood pressure should be checked at least every 1 to 2 years. Ongoing high blood pressure should be treated with medicines if weight loss and exercise are not effective.  If you are 45 to 73 years old, ask your caregiver if you should take aspirin to prevent heart disease.  Diabetes screening involves taking a blood sample to check your fasting blood sugar level. This should be done once every 3 years, after age 45, if you are within normal weight and without risk factors for diabetes. Testing should be considered at a younger age or be carried out more frequently if you are overweight and have at least 1 risk factor for diabetes.  Colorectal cancer can be detected and often prevented. Most routine colorectal cancer screening begins at the age of 50 and continues through age 75. However, your caregiver may recommend screening at an earlier age if you have risk factors for colon cancer. On a yearly basis, your caregiver may provide home test kits to check for hidden blood in the stool. Use of a small camera at the end of a tube,   to directly examine the colon (sigmoidoscopy or colonoscopy), can detect the earliest forms of colorectal cancer. Talk to your caregiver about this at age 50, when routine screening begins. Direct examination of the colon should be repeated every 5 to 10 years through age 75, unless early forms of pre-cancerous polyps or small growths are found.  Hepatitis C blood testing is recommended for all people born from 1945 through 1965 and any individual with known risks for hepatitis C.  Healthy  men should no longer receive prostate-specific antigen (PSA) blood tests as part of routine cancer screening. Consult with your caregiver about prostate cancer screening.  Testicular cancer screening is not recommended for adolescents or adult males who have no symptoms. Screening includes self-exam, caregiver exam, and other screening tests. Consult with your caregiver about any symptoms you have or any concerns you have about testicular cancer.  Practice safe sex. Use condoms and avoid high-risk sexual practices to reduce the spread of sexually transmitted infections (STIs).  Use sunscreen with a sun protection factor (SPF) of 30 or greater. Apply sunscreen liberally and repeatedly throughout the day. You should seek shade when your shadow is shorter than you. Protect yourself by wearing long sleeves, pants, a wide-brimmed hat, and sunglasses year round, whenever you are outdoors.  Notify your caregiver of new moles or changes in moles, especially if there is a change in shape or color. Also notify your caregiver if a mole is larger than the size of a pencil eraser.  A one-time screening for abdominal aortic aneurysm (AAA) and surgical repair of large AAAs by sound wave imaging (ultrasonography) is recommended for ages 65 to 75 years who are current or former smokers.  Stay current with your immunizations. Document Released: 05/14/2008 Document Revised: 02/08/2012 Document Reviewed: 04/13/2011 [employer] Patient Information 2013 [employer]. Diabetes, Type 2 Diabetes is a long-lasting (chronic) disease. In type 2 diabetes, the pancreas does not make enough insulin (a hormone), and the body does not respond normally to the insulin that is made. This type of diabetes was also previously called adult-onset diabetes. It usually occurs after the age of 40, but it can occur at any age.  CAUSES  Type 2 diabetes happens because the pancreasis not making enough insulin or your body has trouble using  the insulin that your pancreas does make properly. SYMPTOMS   Drinking more than usual.  Urinating more than usual.  Blurred vision.  Dry, itchy skin.  Frequent infections.  Feeling more tired than usual (fatigue). DIAGNOSIS The diagnosis of type 2 diabetes is usually made by one of the following tests:  Fasting blood glucose test. You will not eat for at least 8 hours and then take a blood test.  Random blood glucose test. Your blood glucose (sugar) is checked at any time of the day regardless of when you ate.  Oral glucose tolerance test (OGTT). Your blood glucose is measured after you have not eaten (fasted) and then after you drink a glucose containing beverage. TREATMENT   Healthy eating.  Exercise.  Medicine, if needed.  Monitoring blood glucose.  Seeing your caregiver regularly. HOME CARE INSTRUCTIONS   Check your blood glucose at least once a day. More frequent monitoring may be necessary, depending on your medicines and on how well your diabetes is controlled. Your caregiver will advise you.  Take your medicine as directed by your caregiver.  Do not smoke.  Make wise food choices. Ask your caregiver for information. Weight loss can improve your diabetes.    Learn about low blood glucose (hypoglycemia) and how to treat it.  Get your eyes checked regularly.  Have a yearly physical exam. Have your blood pressure checked and your blood and urine tested.  Wear a pendant or bracelet saying that you have diabetes.  Check your feet every night for cuts, sores, blisters, and redness. Let your caregiver know if you have any problems. SEEK MEDICAL CARE IF:   You have problems keeping your blood glucose in target range.  You have problems with your medicines.  You have symptoms of an illness that do not improve after 24 hours.  You have a sore or wound that is not healing.  You notice a change in vision or a new problem with your vision.  You have a  fever. MAKE SURE YOU:  Understand these instructions.  Will watch your condition.  Will get help right away if you are not doing well or get worse. Document Released: 11/16/2005 Document Revised: 02/08/2012 Document Reviewed: 05/04/2011 [employer] Patient Information 2013 [employer].  

## 2012-10-17 NOTE — Assessment & Plan Note (Signed)
His BP is well controlled 

## 2012-10-17 NOTE — Assessment & Plan Note (Signed)
He is doing well on crestor 

## 2012-10-17 NOTE — Assessment & Plan Note (Signed)
He is due for an updated ECHO

## 2012-10-25 ENCOUNTER — Other Ambulatory Visit (HOSPITAL_COMMUNITY): Payer: Medicare Other

## 2012-11-01 ENCOUNTER — Other Ambulatory Visit (HOSPITAL_COMMUNITY): Payer: Medicare Other

## 2012-11-07 ENCOUNTER — Ambulatory Visit (HOSPITAL_COMMUNITY): Payer: Medicare Other | Attending: Internal Medicine | Admitting: Radiology

## 2012-11-07 DIAGNOSIS — E119 Type 2 diabetes mellitus without complications: Secondary | ICD-10-CM | POA: Insufficient documentation

## 2012-11-07 DIAGNOSIS — I359 Nonrheumatic aortic valve disorder, unspecified: Secondary | ICD-10-CM

## 2012-11-07 DIAGNOSIS — I369 Nonrheumatic tricuspid valve disorder, unspecified: Secondary | ICD-10-CM | POA: Insufficient documentation

## 2012-11-07 DIAGNOSIS — I10 Essential (primary) hypertension: Secondary | ICD-10-CM | POA: Insufficient documentation

## 2012-11-07 DIAGNOSIS — I739 Peripheral vascular disease, unspecified: Secondary | ICD-10-CM | POA: Insufficient documentation

## 2012-11-07 NOTE — Progress Notes (Signed)
Echocardiogram performed.  

## 2012-11-10 ENCOUNTER — Encounter: Payer: Self-pay | Admitting: Internal Medicine

## 2012-12-15 ENCOUNTER — Other Ambulatory Visit: Payer: Self-pay

## 2012-12-15 MED ORDER — LOSARTAN POTASSIUM 100 MG PO TABS: ORAL_TABLET | ORAL | Status: DC

## 2013-02-15 ENCOUNTER — Encounter: Payer: Self-pay | Admitting: Internal Medicine

## 2013-02-15 ENCOUNTER — Other Ambulatory Visit (INDEPENDENT_AMBULATORY_CARE_PROVIDER_SITE_OTHER): Payer: Medicare Other

## 2013-02-15 ENCOUNTER — Ambulatory Visit (INDEPENDENT_AMBULATORY_CARE_PROVIDER_SITE_OTHER): Payer: Medicare Other | Admitting: Internal Medicine

## 2013-02-15 VITALS — BP 108/70 | HR 57 | Temp 98.6°F | Resp 16 | Wt 239.2 lb

## 2013-02-15 DIAGNOSIS — IMO0001 Reserved for inherently not codable concepts without codable children: Secondary | ICD-10-CM

## 2013-02-15 DIAGNOSIS — I10 Essential (primary) hypertension: Secondary | ICD-10-CM

## 2013-02-15 DIAGNOSIS — E785 Hyperlipidemia, unspecified: Secondary | ICD-10-CM

## 2013-02-15 DIAGNOSIS — N259 Disorder resulting from impaired renal tubular function, unspecified: Secondary | ICD-10-CM

## 2013-02-15 LAB — COMPREHENSIVE METABOLIC PANEL
ALT: 28 U/L (ref 0–53)
AST: 21 U/L (ref 0–37)
Albumin: 4.2 g/dL (ref 3.5–5.2)
CO2: 30 mEq/L (ref 19–32)
Calcium: 9.4 mg/dL (ref 8.4–10.5)
Chloride: 99 mEq/L (ref 96–112)
Creatinine, Ser: 1.3 mg/dL (ref 0.4–1.5)
GFR: 57.87 mL/min — ABNORMAL LOW (ref >60.00)
Potassium: 3.7 mEq/L (ref 3.5–5.1)

## 2013-02-15 LAB — TSH: TSH: 2.46 u[IU]/mL (ref 0.35–5.50)

## 2013-02-15 LAB — CBC WITH DIFFERENTIAL/PLATELET
Basophils Absolute: 0.1 10*3/uL (ref 0.0–0.1)
Basophils Relative: 0.7 % (ref 0.0–3.0)
Eosinophils Absolute: 0.3 10*3/uL (ref 0.0–0.7)
Hemoglobin: 16.1 g/dL (ref 13.0–17.0)
Lymphocytes Relative: 23.6 % (ref 12.0–46.0)
MCHC: 34.2 g/dL (ref 30.0–36.0)
Monocytes Relative: 9.9 % (ref 3.0–12.0)
Neutro Abs: 4.6 10*3/uL (ref 1.4–7.7)
Neutrophils Relative %: 61.8 % (ref 43.0–77.0)
RBC: 5.28 Mil/uL (ref 4.22–5.81)

## 2013-02-15 LAB — LIPID PANEL
LDL Cholesterol: 55 mg/dL (ref 0–99)
Total CHOL/HDL Ratio: 3

## 2013-02-15 MED ORDER — DAPAGLIFLOZIN PROPANEDIOL 5 MG PO TABS: 1.0000 | ORAL_TABLET | Freq: Every day | ORAL | Status: DC

## 2013-02-15 NOTE — Patient Instructions (Signed)

## 2013-02-15 NOTE — Assessment & Plan Note (Signed)
FLP TSH CMP today 

## 2013-02-15 NOTE — Addendum Note (Signed)
Addended by: [name] on: 02/15/2013 11:46 AM   Modules accepted: Orders

## 2013-02-15 NOTE — Progress Notes (Signed)
  Subjective:    Patient ID: Ryan Duke, male    DOB: [birth year], 72 y.o.   MRN: [id number]  Hypertension This is a chronic problem. The current episode started more than 1 year ago. The problem has been gradually improving since onset. The problem is controlled. Pertinent negatives include no anxiety, blurred vision, chest pain, headaches, malaise/fatigue, neck pain, orthopnea, palpitations, peripheral edema, PND, shortness of breath or sweats. Past treatments include angiotensin blockers, beta blockers and diuretics. The current treatment provides significant improvement. Compliance problems include exercise and diet.       Review of Systems  Constitutional: Negative.  Negative for fever, chills, malaise/fatigue, diaphoresis, activity change, appetite change, fatigue and unexpected weight change.  HENT: Negative.  Negative for neck pain.   Eyes: Negative.  Negative for blurred vision.  Respiratory: Negative.  Negative for apnea, cough, choking, shortness of breath, wheezing and stridor.   Cardiovascular: Negative.  Negative for chest pain, palpitations, orthopnea, leg swelling and PND.  Gastrointestinal: Negative.   Endocrine: Negative.   Genitourinary: Negative.   Musculoskeletal: Negative.  Negative for myalgias, back pain, joint swelling, arthralgias and gait problem.  Skin: Negative.   Allergic/Immunologic: Negative.   Neurological: Negative for dizziness, weakness, light-headedness, numbness and headaches.  Hematological: Negative.  Negative for adenopathy. Does not bruise/bleed easily.  Psychiatric/Behavioral: Negative.        Objective:   Physical Exam  Vitals reviewed. Constitutional: He is oriented to person, place, and time. He appears well-developed and well-nourished. No distress.  HENT:  Head: Normocephalic and atraumatic.  Mouth/Throat: Oropharynx is clear and moist. No oropharyngeal exudate.  Eyes: Conjunctivae are normal. Right eye exhibits no discharge. Left  eye exhibits no discharge. No scleral icterus.  Neck: Normal range of motion. Neck supple. No JVD present. No tracheal deviation present. No thyromegaly present.  Cardiovascular: Normal rate, regular rhythm, S1 normal, S2 normal and intact distal pulses.  Exam reveals no gallop.   Murmur heard.  Decrescendo systolic murmur is present with a grade of 1/6   No diastolic murmur is present  Pulmonary/Chest: Effort normal and breath sounds normal. No stridor. No respiratory distress. He has no wheezes. He has no rales. He exhibits no tenderness.  Abdominal: Soft. Bowel sounds are normal. He exhibits no distension and no mass. There is no tenderness. There is no rebound and no guarding.  Musculoskeletal: Normal range of motion. He exhibits no edema and no tenderness.  Lymphadenopathy:    He has no cervical adenopathy.  Neurological: He is oriented to person, place, and time.  Skin: Skin is warm and dry. No rash noted. He is not diaphoretic. No erythema. No pallor.  Psychiatric: He has a normal mood and affect. His behavior is normal. Judgment and thought content normal.     Lab Results  Component Value Date   WBC 9.9 10/17/2012   HGB 15.9 10/17/2012   HCT 47.7 10/17/2012   PLT 287.0 10/17/2012   GLUCOSE 86 10/17/2012   CHOL 176 02/03/2012   TRIG 99.0 02/03/2012   HDL 54.30 02/03/2012   LDLDIRECT 100.0 06/13/2009   LDLCALC 102* 02/03/2012   ALT 25 10/17/2012   AST 24 10/17/2012   NA 139 10/17/2012   K 3.7 10/17/2012   CL 103 10/17/2012   CREATININE 1.3 10/17/2012   BUN 16 10/17/2012   CO2 29 10/17/2012   TSH 2.14 09/16/2011   PSA 0.71 10/17/2012   HGBA1C 7.0* 10/17/2012       Assessment & Plan:

## 2013-02-15 NOTE — Assessment & Plan Note (Signed)
His BP is well controlled Today I will check his lytes and renal function 

## 2013-02-15 NOTE — Assessment & Plan Note (Signed)
I will monitor his renal function today 

## 2013-02-15 NOTE — Assessment & Plan Note (Addendum)
I will check his a1c and will address if needed  Late note - his a1c is up to 7.8 so I have asked him to start farxiga

## 2013-02-24 ENCOUNTER — Encounter: Payer: Self-pay | Admitting: Internal Medicine

## 2013-02-24 ENCOUNTER — Other Ambulatory Visit: Payer: Self-pay | Admitting: Internal Medicine

## 2013-02-24 DIAGNOSIS — M109 Gout, unspecified: Secondary | ICD-10-CM | POA: Insufficient documentation

## 2013-02-24 MED ORDER — DICLOFENAC 35 MG PO CAPS: 1.0000 | ORAL_CAPSULE | Freq: Three times a day (TID) | ORAL | Status: DC | PRN

## 2013-02-24 MED ORDER — METHYLPREDNISOLONE 4 MG PO KIT: PACK | ORAL | Status: DC

## 2013-03-01 ENCOUNTER — Encounter: Payer: Self-pay | Admitting: Internal Medicine

## 2013-03-13 ENCOUNTER — Encounter: Payer: Self-pay | Admitting: Internal Medicine

## 2013-03-13 ENCOUNTER — Other Ambulatory Visit: Payer: Self-pay | Admitting: Internal Medicine

## 2013-03-13 DIAGNOSIS — M109 Gout, unspecified: Secondary | ICD-10-CM

## 2013-03-13 MED ORDER — COLCHICINE 0.6 MG PO TABS: 0.6000 mg | ORAL_TABLET | Freq: Every day | ORAL | Status: DC

## 2013-04-05 ENCOUNTER — Encounter: Payer: Self-pay | Admitting: Internal Medicine

## 2013-04-05 ENCOUNTER — Other Ambulatory Visit: Payer: Self-pay | Admitting: Internal Medicine

## 2013-06-07 LAB — HM DIABETES EYE EXAM

## 2013-06-08 ENCOUNTER — Ambulatory Visit (INDEPENDENT_AMBULATORY_CARE_PROVIDER_SITE_OTHER): Payer: Medicare Other | Admitting: Internal Medicine

## 2013-06-08 ENCOUNTER — Other Ambulatory Visit (INDEPENDENT_AMBULATORY_CARE_PROVIDER_SITE_OTHER): Payer: Medicare Other

## 2013-06-08 ENCOUNTER — Encounter: Payer: Self-pay | Admitting: Internal Medicine

## 2013-06-08 VITALS — BP 122/80 | HR 58 | Temp 98.2°F | Resp 16 | Wt 231.0 lb

## 2013-06-08 DIAGNOSIS — G510 Bell's palsy: Secondary | ICD-10-CM

## 2013-06-08 DIAGNOSIS — I10 Essential (primary) hypertension: Secondary | ICD-10-CM

## 2013-06-08 DIAGNOSIS — I359 Nonrheumatic aortic valve disorder, unspecified: Secondary | ICD-10-CM

## 2013-06-08 DIAGNOSIS — IMO0001 Reserved for inherently not codable concepts without codable children: Secondary | ICD-10-CM

## 2013-06-08 DIAGNOSIS — T502X1A Poisoning by carbonic-anhydrase inhibitors, benzothiadiazides and other diuretics, accidental (unintentional), initial encounter: Secondary | ICD-10-CM

## 2013-06-08 DIAGNOSIS — T503X1A Poisoning by electrolytic, caloric and water-balance agents, accidental (unintentional), initial encounter: Secondary | ICD-10-CM

## 2013-06-08 DIAGNOSIS — E876 Hypokalemia: Secondary | ICD-10-CM | POA: Insufficient documentation

## 2013-06-08 LAB — HM DIABETES FOOT EXAM

## 2013-06-08 LAB — BASIC METABOLIC PANEL
Chloride: 100 mEq/L (ref 96–112)
Creatinine, Ser: 1.3 mg/dL (ref 0.4–1.5)
GFR: 57.31 mL/min — ABNORMAL LOW (ref >60.00)
Potassium: 3.3 mEq/L — ABNORMAL LOW (ref 3.5–5.1)

## 2013-06-08 LAB — CBC WITH DIFFERENTIAL/PLATELET
Basophils Absolute: 0 10*3/uL (ref 0.0–0.1)
Eosinophils Relative: 2.5 % (ref 0.0–5.0)
HCT: 47.5 % (ref 39.0–52.0)
Lymphocytes Relative: 23.8 % (ref 12.0–46.0)
Lymphs Abs: 2.3 10*3/uL (ref 0.7–4.0)
Monocytes Relative: 8.8 % (ref 3.0–12.0)
Neutrophils Relative %: 64.4 % (ref 43.0–77.0)
Platelets: 321 10*3/uL (ref 150.0–400.0)
RDW: 13.4 % (ref 11.5–14.6)
WBC: 9.8 10*3/uL (ref 4.5–10.5)

## 2013-06-08 LAB — HEMOGLOBIN A1C: Hgb A1c MFr Bld: 8.4 % — ABNORMAL HIGH (ref 4.6–6.5)

## 2013-06-08 MED ORDER — POTASSIUM CHLORIDE ER 10 MEQ PO TBCR: 10.0000 meq | EXTENDED_RELEASE_TABLET | Freq: Two times a day (BID) | ORAL | Status: DC

## 2013-06-08 MED ORDER — SITAGLIPTIN PHOSPHATE 100 MG PO TABS: 100.0000 mg | ORAL_TABLET | Freq: Every day | ORAL | Status: DC

## 2013-06-08 NOTE — Patient Instructions (Addendum)
Type 2 Diabetes Mellitus, Adult Type 2 diabetes mellitus, often simply referred to as type 2 diabetes, is a long-lasting (chronic) disease. In type 2 diabetes, the pancreas does not make enough insulin (a hormone), the cells are less responsive to the insulin that is made (insulin resistance), or both. Normally, insulin moves sugars from food into the tissue cells. The tissue cells use the sugars for energy. The lack of insulin or the lack of normal response to insulin causes excess sugars to build up in the blood instead of going into the tissue cells. As a result, high blood sugar (hyperglycemia) develops. The effect of high sugar (glucose) levels can cause many complications. Type 2 diabetes was also previously called adult-onset diabetes but it can occur at any age.  RISK FACTORS  A person is predisposed to developing type 2 diabetes if someone in the family has the disease and also has one or more of the following primary risk factors:  Overweight.  An inactive lifestyle.  A history of consistently eating high-calorie foods. Maintaining a normal weight and regular physical activity can reduce the chance of developing type 2 diabetes. SYMPTOMS  A person with type 2 diabetes may not show symptoms initially. The symptoms of type 2 diabetes appear slowly. The symptoms include:  Increased thirst (polydipsia).  Increased urination (polyuria).  Increased urination during the night (nocturia).  Weight loss. This weight loss may be rapid.  Frequent, recurring infections.  Tiredness (fatigue).  Weakness.  Vision changes, such as blurred vision.  Fruity smell to your breath.  Abdominal pain.  Nausea or vomiting.  Cuts or bruises which are slow to heal.  Tingling or numbness in the hands or feet. DIAGNOSIS Type 2 diabetes is frequently not diagnosed until complications of diabetes are present. Type 2 diabetes is diagnosed when symptoms or complications are present and when blood  glucose levels are increased. Your blood glucose level may be checked by one or more of the following blood tests:  A fasting blood glucose test. You will not be allowed to eat for at least 8 hours before a blood sample is taken.  A random blood glucose test. Your blood glucose is checked at any time of the day regardless of when you ate.  A hemoglobin A1c blood glucose test. A hemoglobin A1c test provides information about blood glucose control over the previous 3 months.  An oral glucose tolerance test (OGTT). Your blood glucose is measured after you have not eaten (fasted) for 2 hours and then after you drink a glucose-containing beverage. TREATMENT   You may need to take insulin or diabetes medicine daily to keep blood glucose levels in the desired range.  You will need to match insulin dosing with exercise and healthy food choices. The treatment goal is to maintain the before meal blood sugar (preprandial glucose) level at 70 130 mg/dL. HOME CARE INSTRUCTIONS   Have your hemoglobin A1c level checked twice a year.  Perform daily blood glucose monitoring as directed by your caregiver.  Monitor urine ketones when you are ill and as directed by your caregiver.  Take your diabetes medicine or insulin as directed by your caregiver to maintain your blood glucose levels in the desired range.  Never run out of diabetes medicine or insulin. It is needed every day.  Adjust insulin based on your intake of carbohydrates. Carbohydrates can raise blood glucose levels but need to be included in your diet. Carbohydrates provide vitamins, minerals, and fiber which are an essential part of   a healthy diet. Carbohydrates are found in fruits, vegetables, whole grains, dairy products, legumes, and foods containing added sugars.    Eat healthy foods. Alternate 3 meals with 3 snacks.  Lose weight if overweight.  Carry a medical alert card or wear your medical alert jewelry.  Carry a 15 gram  carbohydrate snack with you at all times to treat low blood glucose (hypoglycemia). Some examples of 15 gram carbohydrate snacks include:  Glucose tablets, 3 or 4   Glucose gel, 15 gram tube  Raisins, 2 tablespoons (24 grams)  Jelly beans, 6  Animal crackers, 8  Regular pop, 4 ounces (120 mL)  Gummy treats, 9  Recognize hypoglycemia. Hypoglycemia occurs with blood glucose levels of 70 mg/dL and below. The risk for hypoglycemia increases when fasting or skipping meals, during or after intense exercise, and during sleep. Hypoglycemia symptoms can include:  Tremors or shakes.  Decreased ability to concentrate.  Sweating.  Increased heart rate.  Headache.  Dry mouth.  Hunger.  Irritability.  Anxiety.  Restless sleep.  Altered speech or coordination.  Confusion.  Treat hypoglycemia promptly. If you are alert and able to safely swallow, follow the 15:15 rule:  Take 15 20 grams of rapid-acting glucose or carbohydrate. Rapid-acting options include glucose gel, glucose tablets, or 4 ounces (120 mL) of fruit juice, regular soda, or low fat milk.  Check your blood glucose level 15 minutes after taking the glucose.  Take 15 20 grams more of glucose if the repeat blood glucose level is still 70 mg/dL or below.  Eat a meal or snack within 1 hour once blood glucose levels return to normal.    Be alert to polyuria and polydipsia which are early signs of hyperglycemia. An early awareness of hyperglycemia allows for prompt treatment. Treat hyperglycemia as directed by your caregiver.  Engage in at least 150 minutes of moderate-intensity physical activity a week, spread over at least 3 days of the week or as directed by your caregiver. In addition, you should engage in resistance exercise at least 2 times a week or as directed by your caregiver.  Adjust your medicine and food intake as needed if you start a new exercise or sport.  Follow your sick day plan at any time you  are unable to eat or drink as usual.  Avoid tobacco use.  Limit alcohol intake to no more than 1 drink per day for nonpregnant women and 2 drinks per day for men. You should drink alcohol only when you are also eating food. Talk with your caregiver whether alcohol is safe for you. Tell your caregiver if you drink alcohol several times a week.  Follow up with your caregiver regularly.  Schedule an eye exam soon after the diagnosis of type 2 diabetes and then annually.  Perform daily skin and foot care. Examine your skin and feet daily for cuts, bruises, redness, nail problems, bleeding, blisters, or sores. A foot exam by a caregiver should be done annually.  Brush your teeth and gums at least twice a day and floss at least once a day. Follow up with your dentist regularly.  Share your diabetes management plan with your workplace or school.  Stay up-to-date with immunizations.  Learn to manage stress.  Obtain ongoing diabetes education and support as needed.  Participate in, or seek rehabilitation as needed to maintain or improve independence and quality of life. Request a physical or occupational therapy referral if you are having foot or hand numbness or difficulties with grooming,   dressing, eating, or physical activity. SEEK MEDICAL CARE IF:   You are unable to eat food or drink fluids for more than 6 hours.  You have nausea and vomiting for more than 6 hours.  Your blood glucose level is over 240 mg/dL.  There is a change in mental status.  You develop an additional serious illness.  You have diarrhea for more than 6 hours.  You have been sick or have had a fever for a couple of days and are not getting better.  You have pain during any physical activity.  SEEK IMMEDIATE MEDICAL CARE IF:  You have difficulty breathing.  You have moderate to large ketone levels. MAKE SURE YOU:  Understand these instructions.  Will watch your condition.  Will get help right away if  you are not doing well or get worse. Document Released: 11/16/2005 Document Revised: 08/10/2012 Document Reviewed: 06/14/2012 [employer] Patient Information 2014 [employer].  

## 2013-06-08 NOTE — Assessment & Plan Note (Addendum)
He saw his eye doctor yesterday and does not know what is wrong with his eye I am concerned about CVA, mass, abscess (will check a CBC today) so I have ordered an MRI of the brain

## 2013-06-08 NOTE — Progress Notes (Signed)
Subjective:    Patient ID: Ryan Duke, male    DOB: 26-Apr-1941, 72 y.o.   MRN: [id number]  Eye Problem  The left eye is affected.This is a new problem. The current episode started in the past 7 days. The problem occurs constantly. The problem has been gradually worsening. There was no injury mechanism. The pain is at a severity of 0/10. The patient is experiencing no pain. There is no known exposure to pink eye. He does not wear contacts. Associated symptoms include blurred vision (left eye) and double vision (left eye). Pertinent negatives include no eye discharge, eye redness, fever, foreign body sensation, itching, nausea, photophobia, recent URI, vomiting or weakness. He has tried nothing for the symptoms. The treatment provided no relief.      Review of Systems  Constitutional: Positive for fatigue. Negative for fever, chills, diaphoresis, activity change and appetite change.  HENT: Positive for sinus pressure (left). Negative for facial swelling.   Eyes: Positive for blurred vision (left eye), double vision (left eye) and visual disturbance. Negative for photophobia, pain, discharge, redness and itching.  Respiratory: Negative.  Negative for cough, chest tightness, wheezing and stridor.   Cardiovascular: Negative.  Negative for chest pain, palpitations and leg swelling.  Gastrointestinal: Negative for nausea, vomiting, abdominal pain and constipation.  Endocrine: Negative.  Negative for polydipsia, polyphagia and polyuria.  Genitourinary: Negative.   Musculoskeletal: Negative.  Negative for myalgias and arthralgias.  Skin: Negative.  Negative for itching and rash.  Allergic/Immunologic: Negative.   Neurological: Positive for dizziness and light-headedness. Negative for tremors, seizures, syncope, facial asymmetry, speech difficulty, weakness, numbness and headaches.  Hematological: Negative.  Negative for adenopathy. Does not bruise/bleed easily.  Psychiatric/Behavioral: Negative.         Objective:   Physical Exam  Vitals reviewed. Constitutional: He is oriented to person, place, and time. He appears well-developed and well-nourished. No distress.  HENT:  Head: Normocephalic and atraumatic.  Mouth/Throat: Oropharynx is clear and moist. No oropharyngeal exudate.  Eyes: Conjunctivae and EOM are normal. Pupils are equal, round, and reactive to light. Right eye exhibits no discharge. Left eye exhibits no discharge. Right conjunctiva is not injected. Right conjunctiva has no hemorrhage. Left conjunctiva is not injected. Left conjunctiva has no hemorrhage. No scleral icterus. Right eye exhibits normal extraocular motion and no nystagmus. Left eye exhibits normal extraocular motion and no nystagmus.  Neck: Normal range of motion. Neck supple. No JVD present. No tracheal deviation present. No thyromegaly present.  Cardiovascular: Normal rate, regular rhythm and intact distal pulses.  Exam reveals no gallop and no friction rub.   Murmur heard.  Decrescendo systolic murmur is present with a grade of 1/6  Pulmonary/Chest: Effort normal and breath sounds normal. No stridor. No respiratory distress. He has no wheezes. He has no rales. He exhibits no tenderness.  Abdominal: Soft. Bowel sounds are normal. He exhibits no distension and no mass. There is no tenderness. There is no rebound and no guarding.  Musculoskeletal: Normal range of motion. He exhibits no edema and no tenderness.  Lymphadenopathy:    He has no cervical adenopathy.  Neurological: He is alert and oriented to person, place, and time. He has normal strength. He displays no atrophy, no tremor and normal reflexes. A cranial nerve deficit (left upper eyelid weakness, droopy, no proptosis) is present. No sensory deficit. He exhibits normal muscle tone. He displays a negative Romberg sign. He displays no seizure activity. Coordination normal. He displays no Babinski's sign on the right side.  He displays no Babinski's sign on  the left side.  Reflex Scores:      Tricep reflexes are 1+ on the right side and 1+ on the left side.      Bicep reflexes are 1+ on the right side and 1+ on the left side.      Brachioradialis reflexes are 1+ on the right side and 1+ on the left side.      Patellar reflexes are 1+ on the right side and 1+ on the left side.      Achilles reflexes are 1+ on the right side and 1+ on the left side. Skin: Skin is warm and dry. No rash noted. He is not diaphoretic. No erythema. No pallor.  Psychiatric: He has a normal mood and affect. His behavior is normal. Judgment and thought content normal.          Assessment & Plan:

## 2013-06-08 NOTE — Assessment & Plan Note (Signed)
His BP is well controlled 

## 2013-06-08 NOTE — Addendum Note (Signed)
Addended by: [name] on: 06/08/2013 04:51 PM   Modules accepted: Orders

## 2013-06-08 NOTE — Assessment & Plan Note (Addendum)
I will recheck his A1C today Will also check his renal function He tells that onglyza is too expensive so switched to [place]

## 2013-06-09 ENCOUNTER — Telehealth: Payer: Self-pay

## 2013-06-09 ENCOUNTER — Encounter: Payer: Self-pay | Admitting: Internal Medicine

## 2013-06-09 MED ORDER — INSULIN GLARGINE 100 UNIT/ML SOLOSTAR PEN: 30.0000 [IU] | PEN_INJECTOR | Freq: Every day | SUBCUTANEOUS | Status: DC

## 2013-06-09 NOTE — Telephone Encounter (Signed)
4 months

## 2013-06-09 NOTE — Telephone Encounter (Signed)
Pt notified via mychart message.

## 2013-06-09 NOTE — Telephone Encounter (Signed)
Patient would like to know when MD suggest that he schedule the next follow up appt. Thanks

## 2013-06-17 ENCOUNTER — Ambulatory Visit
Admission: RE | Admit: 2013-06-17 | Discharge: 2013-06-17 | Disposition: A | Discharge status: Home / Self Care | Payer: Medicare Other | Source: Ambulatory Visit | Attending: Internal Medicine | Admitting: Internal Medicine

## 2013-06-17 DIAGNOSIS — G510 Bell's palsy: Secondary | ICD-10-CM

## 2013-06-18 ENCOUNTER — Encounter: Payer: Self-pay | Admitting: Internal Medicine

## 2013-06-19 ENCOUNTER — Encounter: Payer: Self-pay | Admitting: Internal Medicine

## 2013-06-20 ENCOUNTER — Ambulatory Visit: Payer: Medicare Other | Admitting: Internal Medicine

## 2013-07-17 ENCOUNTER — Other Ambulatory Visit: Payer: Self-pay

## 2013-07-17 DIAGNOSIS — E785 Hyperlipidemia, unspecified: Secondary | ICD-10-CM

## 2013-07-17 DIAGNOSIS — IMO0001 Reserved for inherently not codable concepts without codable children: Secondary | ICD-10-CM

## 2013-07-17 MED ORDER — ROSUVASTATIN CALCIUM 10 MG PO TABS: 10.0000 mg | ORAL_TABLET | Freq: Every day | ORAL | Status: DC

## 2013-07-17 NOTE — Telephone Encounter (Signed)
Patient called requesting samples or RX for crestor 10mg . Patient notified to pick up samples

## 2013-08-29 ENCOUNTER — Other Ambulatory Visit: Payer: Self-pay

## 2013-08-29 MED ORDER — INSULIN GLARGINE 100 UNIT/ML SOLOSTAR PEN: 30.0000 [IU] | PEN_INJECTOR | Freq: Every day | SUBCUTANEOUS | Status: DC

## 2013-10-05 ENCOUNTER — Other Ambulatory Visit: Payer: Self-pay

## 2013-10-05 MED ORDER — INSULIN GLARGINE 100 UNIT/ML SOLOSTAR PEN: 30.0000 [IU] | PEN_INJECTOR | Freq: Every day | SUBCUTANEOUS | Status: DC

## 2013-10-05 NOTE — Telephone Encounter (Signed)
Pt called request refill on Lantus pens to [facility]

## 2013-10-17 ENCOUNTER — Encounter: Payer: Medicare Other | Admitting: Internal Medicine

## 2013-10-18 ENCOUNTER — Ambulatory Visit (INDEPENDENT_AMBULATORY_CARE_PROVIDER_SITE_OTHER): Payer: Medicare Other | Admitting: Internal Medicine

## 2013-10-18 ENCOUNTER — Encounter: Payer: Self-pay | Admitting: Internal Medicine

## 2013-10-18 ENCOUNTER — Other Ambulatory Visit (INDEPENDENT_AMBULATORY_CARE_PROVIDER_SITE_OTHER): Payer: Medicare Other

## 2013-10-18 VITALS — BP 136/70 | HR 48 | Temp 97.8°F | Resp 16 | Ht 71.0 in | Wt 235.0 lb

## 2013-10-18 DIAGNOSIS — Z23 Encounter for immunization: Secondary | ICD-10-CM

## 2013-10-18 DIAGNOSIS — M109 Gout, unspecified: Secondary | ICD-10-CM

## 2013-10-18 DIAGNOSIS — T50901S Poisoning by unspecified drugs, medicaments and biological substances, accidental (unintentional), sequela: Secondary | ICD-10-CM

## 2013-10-18 DIAGNOSIS — E785 Hyperlipidemia, unspecified: Secondary | ICD-10-CM

## 2013-10-18 DIAGNOSIS — I359 Nonrheumatic aortic valve disorder, unspecified: Secondary | ICD-10-CM

## 2013-10-18 DIAGNOSIS — N259 Disorder resulting from impaired renal tubular function, unspecified: Secondary | ICD-10-CM

## 2013-10-18 DIAGNOSIS — T6591XS Toxic effect of unspecified substance, accidental (unintentional), sequela: Secondary | ICD-10-CM

## 2013-10-18 DIAGNOSIS — I10 Essential (primary) hypertension: Secondary | ICD-10-CM

## 2013-10-18 DIAGNOSIS — IMO0001 Reserved for inherently not codable concepts without codable children: Secondary | ICD-10-CM

## 2013-10-18 DIAGNOSIS — E1129 Type 2 diabetes mellitus with other diabetic kidney complication: Secondary | ICD-10-CM

## 2013-10-18 DIAGNOSIS — Z0000 Encounter for general adult medical examination without abnormal findings: Secondary | ICD-10-CM

## 2013-10-18 DIAGNOSIS — E1165 Type 2 diabetes mellitus with hyperglycemia: Secondary | ICD-10-CM

## 2013-10-18 LAB — URIC ACID: Uric Acid, Serum: 7.2 mg/dL (ref 4.0–7.8)

## 2013-10-18 LAB — COMPREHENSIVE METABOLIC PANEL
ALT: 25 U/L (ref 0–53)
AST: 25 U/L (ref 0–37)
Albumin: 4.1 g/dL (ref 3.5–5.2)
CO2: 29 mEq/L (ref 19–32)
Calcium: 9.5 mg/dL (ref 8.4–10.5)
Chloride: 103 mEq/L (ref 96–112)
GFR: 58.82 mL/min — ABNORMAL LOW (ref >60.00)
Potassium: 4.1 mEq/L (ref 3.5–5.1)
Sodium: 138 mEq/L (ref 135–145)
Total Protein: 7.1 g/dL (ref 6.0–8.3)

## 2013-10-18 LAB — CBC WITH DIFFERENTIAL/PLATELET
Basophils Absolute: 0 10*3/uL (ref 0.0–0.1)
Eosinophils Absolute: 0.3 10*3/uL (ref 0.0–0.7)
Lymphocytes Relative: 23.3 % (ref 12.0–46.0)
Lymphs Abs: 2 10*3/uL (ref 0.7–4.0)
MCHC: 33.7 g/dL (ref 30.0–36.0)
Monocytes Relative: 9.5 % (ref 3.0–12.0)
Neutro Abs: 5.5 10*3/uL (ref 1.4–7.7)
Neutrophils Relative %: 62.7 % (ref 43.0–77.0)
Platelets: 271 10*3/uL (ref 150.0–400.0)
RDW: 13.7 % (ref 11.5–14.6)
WBC: 8.7 10*3/uL (ref 4.5–10.5)

## 2013-10-18 LAB — HEMOGLOBIN A1C: Hgb A1c MFr Bld: 6.9 % — ABNORMAL HIGH (ref 4.6–6.5)

## 2013-10-18 LAB — LIPID PANEL
HDL: 45.6 mg/dL (ref >39.00)
LDL Cholesterol: 63 mg/dL (ref 0–99)
Total CHOL/HDL Ratio: 3

## 2013-10-18 MED ORDER — SITAGLIPTIN PHOSPHATE 100 MG PO TABS: 100.0000 mg | ORAL_TABLET | Freq: Every day | ORAL | Status: DC

## 2013-10-18 NOTE — Progress Notes (Signed)
Pre visit review using our clinic review tool, if applicable. No additional management support is needed unless otherwise documented below in the visit note. 

## 2013-10-18 NOTE — Assessment & Plan Note (Signed)
He has not improved much on his lifestyle modifications I will recheck his A1C and will advise further

## 2013-10-18 NOTE — Assessment & Plan Note (Signed)
He has no s/s I will recheck his ECHO and if this is progressing then I will ask him to see cardiology

## 2013-10-18 NOTE — Patient Instructions (Signed)
Health Maintenance, Males A healthy lifestyle and preventative care can promote health and wellness.  Maintain regular health, dental, and eye exams.  Eat a healthy diet. Foods like vegetables, fruits, whole grains, low-fat dairy products, and lean protein foods contain the nutrients you need without too many calories. Decrease your intake of foods high in solid fats, added sugars, and salt. Get information about a proper diet from your caregiver, if necessary.  Regular physical exercise is one of the most important things you can do for your health. Most adults should get at least 150 minutes of moderate-intensity exercise (any activity that increases your heart rate and causes you to sweat) each week. In addition, most adults need muscle-strengthening exercises on 2 or more days a week.   Maintain a healthy weight. The body mass index (BMI) is a screening tool to identify possible weight problems. It provides an estimate of body fat based on height and weight. Your caregiver can help determine your BMI, and can help you achieve or maintain a healthy weight. For adults 20 years and older:  A BMI below 18.5 is considered underweight.  A BMI of 18.5 to 24.9 is normal.  A BMI of 25 to 29.9 is considered overweight.  A BMI of 30 and above is considered obese.  Maintain normal blood lipids and cholesterol by exercising and minimizing your intake of saturated fat. Eat a balanced diet with plenty of fruits and vegetables. Blood tests for lipids and cholesterol should begin at age 20 and be repeated every 5 years. If your lipid or cholesterol levels are high, you are over 50, or you are a high risk for heart disease, you may need your cholesterol levels checked more frequently.Ongoing high lipid and cholesterol levels should be treated with medicines, if diet and exercise are not effective.  If you smoke, find out from your caregiver how to quit. If you do not use tobacco, do not start.  Lung  cancer screening is recommended for adults aged 55 80 years who are at high risk for developing lung cancer because of a history of smoking. Yearly low-dose computed tomography (CT) is recommended for people who have at least a 30-pack-year history of smoking and are a current smoker or have quit within the past 15 years. A pack year of smoking is smoking an average of 1 pack of cigarettes a day for 1 year (for example: 1 pack a day for 30 years or 2 packs a day for 15 years). Yearly screening should continue until the smoker has stopped smoking for at least 15 years. Yearly screening should also be stopped for people who develop a health problem that would prevent them from having lung cancer treatment.  If you choose to drink alcohol, do not exceed 2 drinks per day. One drink is considered to be 12 ounces (355 mL) of beer, 5 ounces (148 mL) of wine, or 1.5 ounces (44 mL) of liquor.  Avoid use of street drugs. Do not share needles with anyone. Ask for help if you need support or instructions about stopping the use of drugs.  High blood pressure causes heart disease and increases the risk of stroke. Blood pressure should be checked at least every 1 to 2 years. Ongoing high blood pressure should be treated with medicines if weight loss and exercise are not effective.  If you are 45 to 74 years old, ask your caregiver if you should take aspirin to prevent heart disease.  Diabetes screening involves taking a blood   sample to check your fasting blood sugar level. This should be done once every 3 years, after age 45, if you are within normal weight and without risk factors for diabetes. Testing should be considered at a younger age or be carried out more frequently if you are overweight and have at least 1 risk factor for diabetes.  Colorectal cancer can be detected and often prevented. Most routine colorectal cancer screening begins at the age of 50 and continues through age 75. However, your caregiver may  recommend screening at an earlier age if you have risk factors for colon cancer. On a yearly basis, your caregiver may provide home test kits to check for hidden blood in the stool. Use of a small camera at the end of a tube, to directly examine the colon (sigmoidoscopy or colonoscopy), can detect the earliest forms of colorectal cancer. Talk to your caregiver about this at age 50, when routine screening begins. Direct examination of the colon should be repeated every 5 to 10 years through age 75, unless early forms of pre-cancerous polyps or small growths are found.  Hepatitis C blood testing is recommended for all people born from 1945 through 1965 and any individual with known risks for hepatitis C.  Healthy men should no longer receive prostate-specific antigen (PSA) blood tests as part of routine cancer screening. Consult with your caregiver about prostate cancer screening.  Testicular cancer screening is not recommended for adolescents or adult males who have no symptoms. Screening includes self-exam, caregiver exam, and other screening tests. Consult with your caregiver about any symptoms you have or any concerns you have about testicular cancer.  Practice safe sex. Use condoms and avoid high-risk sexual practices to reduce the spread of sexually transmitted infections (STIs).  Use sunscreen. Apply sunscreen liberally and repeatedly throughout the day. You should seek shade when your shadow is shorter than you. Protect yourself by wearing long sleeves, pants, a wide-brimmed hat, and sunglasses year round, whenever you are outdoors.  Notify your caregiver of new moles or changes in moles, especially if there is a change in shape or color. Also notify your caregiver if a mole is larger than the size of a pencil eraser.  A one-time screening for abdominal aortic aneurysm (AAA) and surgical repair of large AAAs by sound wave imaging (ultrasonography) is recommended for ages 65 to 75 years who are  current or former smokers.  Stay current with your immunizations. Document Released: 05/14/2008 Document Revised: 03/13/2013 Document Reviewed: 04/13/2011 [employer] Patient Information 2014 [employer]. Type 2 Diabetes Mellitus, Adult Type 2 diabetes mellitus, often simply referred to as type 2 diabetes, is a long-lasting (chronic) disease. In type 2 diabetes, the pancreas does not make enough insulin (a hormone), the cells are less responsive to the insulin that is made (insulin resistance), or both. Normally, insulin moves sugars from food into the tissue cells. The tissue cells use the sugars for energy. The lack of insulin or the lack of normal response to insulin causes excess sugars to build up in the blood instead of going into the tissue cells. As a result, high blood sugar (hyperglycemia) develops. The effect of high sugar (glucose) levels can cause many complications. Type 2 diabetes was also previously called adult-onset diabetes but it can occur at any age.  RISK FACTORS  A person is predisposed to developing type 2 diabetes if someone in the family has the disease and also has one or more of the following primary risk factors:  Overweight.    An inactive lifestyle.  A history of consistently eating high-calorie foods. Maintaining a normal weight and regular physical activity can reduce the chance of developing type 2 diabetes. SYMPTOMS  A person with type 2 diabetes may not show symptoms initially. The symptoms of type 2 diabetes appear slowly. The symptoms include:  Increased thirst (polydipsia).  Increased urination (polyuria).  Increased urination during the night (nocturia).  Weight loss. This weight loss may be rapid.  Frequent, recurring infections.  Tiredness (fatigue).  Weakness.  Vision changes, such as blurred vision.  Fruity smell to your breath.  Abdominal pain.  Nausea or vomiting.  Cuts or bruises which are slow to heal.  Tingling or numbness  in the hands or feet. DIAGNOSIS Type 2 diabetes is frequently not diagnosed until complications of diabetes are present. Type 2 diabetes is diagnosed when symptoms or complications are present and when blood glucose levels are increased. Your blood glucose level may be checked by one or more of the following blood tests:  A fasting blood glucose test. You will not be allowed to eat for at least 8 hours before a blood sample is taken.  A random blood glucose test. Your blood glucose is checked at any time of the day regardless of when you ate.  A hemoglobin A1c blood glucose test. A hemoglobin A1c test provides information about blood glucose control over the previous 3 months.  An oral glucose tolerance test (OGTT). Your blood glucose is measured after you have not eaten (fasted) for 2 hours and then after you drink a glucose-containing beverage. TREATMENT   You may need to take insulin or diabetes medicine daily to keep blood glucose levels in the desired range.  You will need to match insulin dosing with exercise and healthy food choices. The treatment goal is to maintain the before meal blood sugar (preprandial glucose) level at 70 130 mg/dL. HOME CARE INSTRUCTIONS   Have your hemoglobin A1c level checked twice a year.  Perform daily blood glucose monitoring as directed by your caregiver.  Monitor urine ketones when you are ill and as directed by your caregiver.  Take your diabetes medicine or insulin as directed by your caregiver to maintain your blood glucose levels in the desired range.  Never run out of diabetes medicine or insulin. It is needed every day.  Adjust insulin based on your intake of carbohydrates. Carbohydrates can raise blood glucose levels but need to be included in your diet. Carbohydrates provide vitamins, minerals, and fiber which are an essential part of a healthy diet. Carbohydrates are found in fruits, vegetables, whole grains, dairy products, legumes, and  foods containing added sugars.    Eat healthy foods. Alternate 3 meals with 3 snacks.  Lose weight if overweight.  Carry a medical alert card or wear your medical alert jewelry.  Carry a 15 gram carbohydrate snack with you at all times to treat low blood glucose (hypoglycemia). Some examples of 15 gram carbohydrate snacks include:  Glucose tablets, 3 or 4   Glucose gel, 15 gram tube  Raisins, 2 tablespoons (24 grams)  Jelly beans, 6  Animal crackers, 8  Regular pop, 4 ounces (120 mL)  Gummy treats, 9  Recognize hypoglycemia. Hypoglycemia occurs with blood glucose levels of 70 mg/dL and below. The risk for hypoglycemia increases when fasting or skipping meals, during or after intense exercise, and during sleep. Hypoglycemia symptoms can include:  Tremors or shakes.  Decreased ability to concentrate.  Sweating.  Increased heart rate.  Headache.    Dry mouth.  Hunger.  Irritability.  Anxiety.  Restless sleep.  Altered speech or coordination.  Confusion.  Treat hypoglycemia promptly. If you are alert and able to safely swallow, follow the 15:15 rule:  Take 15 20 grams of rapid-acting glucose or carbohydrate. Rapid-acting options include glucose gel, glucose tablets, or 4 ounces (120 mL) of fruit juice, regular soda, or low fat milk.  Check your blood glucose level 15 minutes after taking the glucose.  Take 15 20 grams more of glucose if the repeat blood glucose level is still 70 mg/dL or below.  Eat a meal or snack within 1 hour once blood glucose levels return to normal.    Be alert to polyuria and polydipsia which are early signs of hyperglycemia. An early awareness of hyperglycemia allows for prompt treatment. Treat hyperglycemia as directed by your caregiver.  Engage in at least 150 minutes of moderate-intensity physical activity a week, spread over at least 3 days of the week or as directed by your caregiver. In addition, you should engage in  resistance exercise at least 2 times a week or as directed by your caregiver.  Adjust your medicine and food intake as needed if you start a new exercise or sport.  Follow your sick day plan at any time you are unable to eat or drink as usual.  Avoid tobacco use.  Limit alcohol intake to no more than 1 drink per day for nonpregnant women and 2 drinks per day for men. You should drink alcohol only when you are also eating food. Talk with your caregiver whether alcohol is safe for you. Tell your caregiver if you drink alcohol several times a week.  Follow up with your caregiver regularly.  Schedule an eye exam soon after the diagnosis of type 2 diabetes and then annually.  Perform daily skin and foot care. Examine your skin and feet daily for cuts, bruises, redness, nail problems, bleeding, blisters, or sores. A foot exam by a caregiver should be done annually.  Brush your teeth and gums at least twice a day and floss at least once a day. Follow up with your dentist regularly.  Share your diabetes management plan with your workplace or school.  Stay up-to-date with immunizations.  Learn to manage stress.  Obtain ongoing diabetes education and support as needed.  Participate in, or seek rehabilitation as needed to maintain or improve independence and quality of life. Request a physical or occupational therapy referral if you are having foot or hand numbness or difficulties with grooming, dressing, eating, or physical activity. SEEK MEDICAL CARE IF:   You are unable to eat food or drink fluids for more than 6 hours.  You have nausea and vomiting for more than 6 hours.  Your blood glucose level is over 240 mg/dL.  There is a change in mental status.  You develop an additional serious illness.  You have diarrhea for more than 6 hours.  You have been sick or have had a fever for a couple of days and are not getting better.  You have pain during any physical activity.  SEEK  IMMEDIATE MEDICAL CARE IF:  You have difficulty breathing.  You have moderate to large ketone levels. MAKE SURE YOU:  Understand these instructions.  Will watch your condition.  Will get help right away if you are not doing well or get worse. Document Released: 11/16/2005 Document Revised: 08/10/2012 Document Reviewed: 06/14/2012 [employer] Patient Information 2014 [employer].  

## 2013-10-18 NOTE — Assessment & Plan Note (Signed)
His BP is well controlled 

## 2013-10-18 NOTE — Assessment & Plan Note (Signed)
I will check his uric acid level, will discuss treat if it is much higher than 6

## 2013-10-18 NOTE — Assessment & Plan Note (Signed)

## 2013-10-18 NOTE — Assessment & Plan Note (Signed)
I will continue manage the risk factors for progression of renal disease Today I will recheck his renal function

## 2013-10-18 NOTE — Progress Notes (Signed)
Subjective:    Patient ID: Ryan Duke, male    DOB: 04-21-1941, 72 y.o.   MRN: [id number]  Hypertension This is a chronic problem. The current episode started more than 1 year ago. The problem is unchanged. The problem is controlled. Pertinent negatives include no anxiety, blurred vision, chest pain, headaches, malaise/fatigue, neck pain, orthopnea, palpitations, peripheral edema, PND, shortness of breath or sweats. Past treatments include angiotensin blockers, diuretics and beta blockers. The current treatment provides moderate improvement. Compliance problems include exercise and diet.       Review of Systems  Constitutional: Positive for unexpected weight change (some weight gain). Negative for fever, chills, malaise/fatigue, diaphoresis, activity change, appetite change and fatigue.  HENT: Negative.   Eyes: Negative.  Negative for blurred vision.  Respiratory: Negative.  Negative for apnea, cough, choking, chest tightness, shortness of breath, wheezing and stridor.   Cardiovascular: Negative.  Negative for chest pain, palpitations, orthopnea, leg swelling and PND.  Gastrointestinal: Negative.  Negative for nausea, vomiting, abdominal pain, diarrhea, constipation and blood in stool.  Endocrine: Negative.  Negative for polydipsia, polyphagia and polyuria.  Genitourinary: Negative.   Musculoskeletal: Negative.  Negative for arthralgias, back pain, gait problem, joint swelling, myalgias, neck pain and neck stiffness.  Skin: Negative.   Allergic/Immunologic: Negative.   Neurological: Negative.  Negative for dizziness, syncope, weakness, light-headedness and headaches.  Hematological: Negative.  Negative for adenopathy. Does not bruise/bleed easily.  Psychiatric/Behavioral: Negative.        Objective:   Physical Exam  Vitals reviewed. Constitutional: He is oriented to person, place, and time. He appears well-developed and well-nourished. No distress.  HENT:  Head: Normocephalic  and atraumatic.  Mouth/Throat: Oropharynx is clear and moist. No oropharyngeal exudate.  Eyes: Conjunctivae are normal. Right eye exhibits no discharge. Left eye exhibits no discharge. No scleral icterus.  Neck: Normal range of motion. Neck supple. No JVD present. No tracheal deviation present. No thyromegaly present.  Cardiovascular: Normal rate, regular rhythm, S2 normal and intact distal pulses.  Exam reveals no gallop.   Murmur heard.  Decrescendo systolic murmur is present with a grade of 2/6   No diastolic murmur is present  Pulses:      Carotid pulses are 1+ on the right side, and 1+ on the left side.      Radial pulses are 1+ on the right side, and 1+ on the left side.       Femoral pulses are 1+ on the right side, and 1+ on the left side.      Popliteal pulses are 1+ on the right side, and 1+ on the left side.       Dorsalis pedis pulses are 1+ on the right side, and 1+ on the left side.       Posterior tibial pulses are 1+ on the right side, and 1+ on the left side.  Pulmonary/Chest: Effort normal and breath sounds normal. No stridor. No respiratory distress. He has no wheezes. He has no rales. He exhibits no tenderness.  Abdominal: Soft. Bowel sounds are normal. He exhibits no distension and no mass. There is no tenderness. There is no rebound and no guarding.  Musculoskeletal: Normal range of motion. He exhibits no edema and no tenderness.  Lymphadenopathy:    He has no cervical adenopathy.  Neurological: He is oriented to person, place, and time.  Skin: Skin is warm and dry. No rash noted. He is not diaphoretic. No erythema. No pallor.  Psychiatric: He has a normal mood  and affect. His behavior is normal. Judgment and thought content normal.     Lab Results  Component Value Date   WBC 9.8 06/08/2013   HGB 15.9 06/08/2013   HCT 47.5 06/08/2013   PLT 321.0 06/08/2013   GLUCOSE 109* 06/08/2013   CHOL 113 02/15/2013   TRIG 109.0 02/15/2013   HDL 36.10* 02/15/2013   LDLDIRECT  100.0 06/13/2009   LDLCALC 55 02/15/2013   ALT 28 02/15/2013   AST 21 02/15/2013   NA 134* 06/08/2013   K 3.3* 06/08/2013   CL 100 06/08/2013   CREATININE 1.3 06/08/2013   BUN 18 06/08/2013   CO2 31 06/08/2013   TSH 2.46 02/15/2013   PSA 0.71 10/17/2012   HGBA1C 8.4* 06/08/2013       Assessment & Plan:

## 2013-10-18 NOTE — Assessment & Plan Note (Signed)
I will check his K+ level today and will check his Mg++ level as well to see if that needs to be addressed

## 2013-10-18 NOTE — Assessment & Plan Note (Signed)
FLP today 

## 2013-11-02 ENCOUNTER — Encounter: Payer: Self-pay | Admitting: Internal Medicine

## 2013-11-03 ENCOUNTER — Other Ambulatory Visit (HOSPITAL_COMMUNITY): Payer: Medicare Other

## 2013-11-09 ENCOUNTER — Ambulatory Visit (HOSPITAL_COMMUNITY): Payer: Medicare Other | Attending: Cardiovascular Disease | Admitting: Radiology

## 2013-11-09 ENCOUNTER — Encounter: Payer: Self-pay | Admitting: Cardiovascular Disease

## 2013-11-09 DIAGNOSIS — I079 Rheumatic tricuspid valve disease, unspecified: Secondary | ICD-10-CM | POA: Insufficient documentation

## 2013-11-09 DIAGNOSIS — E119 Type 2 diabetes mellitus without complications: Secondary | ICD-10-CM | POA: Insufficient documentation

## 2013-11-09 DIAGNOSIS — E669 Obesity, unspecified: Secondary | ICD-10-CM | POA: Insufficient documentation

## 2013-11-09 DIAGNOSIS — Z6832 Body mass index (BMI) 32.0-32.9, adult: Secondary | ICD-10-CM | POA: Insufficient documentation

## 2013-11-09 DIAGNOSIS — E785 Hyperlipidemia, unspecified: Secondary | ICD-10-CM | POA: Insufficient documentation

## 2013-11-09 DIAGNOSIS — I739 Peripheral vascular disease, unspecified: Secondary | ICD-10-CM | POA: Insufficient documentation

## 2013-11-09 DIAGNOSIS — I359 Nonrheumatic aortic valve disorder, unspecified: Secondary | ICD-10-CM | POA: Insufficient documentation

## 2013-11-09 DIAGNOSIS — I059 Rheumatic mitral valve disease, unspecified: Secondary | ICD-10-CM | POA: Insufficient documentation

## 2013-11-09 DIAGNOSIS — I10 Essential (primary) hypertension: Secondary | ICD-10-CM | POA: Insufficient documentation

## 2013-11-09 NOTE — Progress Notes (Signed)
Echocardiogram performed.  

## 2013-11-12 ENCOUNTER — Encounter: Payer: Self-pay | Admitting: Internal Medicine

## 2013-11-12 ENCOUNTER — Other Ambulatory Visit: Payer: Self-pay | Admitting: Internal Medicine

## 2013-11-12 DIAGNOSIS — I359 Nonrheumatic aortic valve disorder, unspecified: Secondary | ICD-10-CM

## 2013-12-20 ENCOUNTER — Ambulatory Visit (INDEPENDENT_AMBULATORY_CARE_PROVIDER_SITE_OTHER): Payer: Medicare Other | Admitting: Cardiovascular Disease

## 2013-12-20 ENCOUNTER — Encounter: Payer: Self-pay | Admitting: Cardiovascular Disease

## 2013-12-20 VITALS — BP 142/84 | HR 55 | Ht 71.0 in | Wt 235.0 lb

## 2013-12-20 DIAGNOSIS — I10 Essential (primary) hypertension: Secondary | ICD-10-CM

## 2013-12-20 DIAGNOSIS — E785 Hyperlipidemia, unspecified: Secondary | ICD-10-CM

## 2013-12-20 DIAGNOSIS — I359 Nonrheumatic aortic valve disorder, unspecified: Secondary | ICD-10-CM

## 2013-12-20 NOTE — Patient Instructions (Signed)
Your physician recommends that you continue on your current medications as directed. Please refer to the Current Medication list given to you today.  Your physician wants you to follow-up in: 1 year with Dr. Burt Knack.  You will receive a reminder letter in the mail two months in advance. If you don't receive a letter, please call our office to schedule the follow-up appointment.  Your physician has requested that you have an echocardiogram prior to your appointment with Dr. Burt Knack in 1 year. Echocardiography is a painless test that uses sound waves to create images of your heart. It provides your doctor with information about the size and shape of your heart and how well your heart's chambers and valves are working. This procedure takes approximately one hour. There are no restrictions for this procedure.

## 2013-12-20 NOTE — Progress Notes (Signed)
HPI:  73 year old gentleman referred for evaluation of aortic stenosis.  The patient has been followed for hypertension, diabetes, and hyperlipidemia. He has a heart murmur dating back several years. There is echo dated back to 2010, when he was noted to have mild aortic stenosis. His most recent echocardiogram demonstrates severe left ventricular hypertrophy and moderate aortic stenosis with mean and peak gradients of 31 and 59 mm mercury, respectively.  From a symptomatic perspective, the patient is doing fine. He is sedentary. 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 He specifically denies symptoms of chest pain, chest pressure, shortness of breath with activity, lightheadedness, or pre-syncope. He denies leg swelling, orthopnea, or PND. He was hospitalized with syncope in 2010 overnight. He otherwise has not had any hospitalizations. His blood pressure has been well-controlled.  His family history is pertinent for coronary artery disease. His father had coronary bypass surgery, but the patient does not know his father's age at the time.  The patient is a nonsmoker, he quit in 1976. He drinks alcohol on rare occasion. He does not exercise.  Outpatient Encounter Prescriptions as of 12/20/2013  Medication Sig  . aspirin 81 MG chewable tablet Chew 81 mg by mouth daily.    Marland Kitchen atenolol-chlorthalidone (TENORETIC) 100-25 MG per tablet TAKE 1 TABLET ONCE DAILY.  Marland Kitchen Cholecalciferol 1000 UNITS tablet Take 1,000 Units by mouth daily.    Marland Kitchen glucose blood (BAYER CONTOUR TEST) test strip 1 each by Other route 2 (two) times daily. Use as instructed   . Glucose Blood (BLOOD GLUCOSE TEST STRIPS) STRP Test twice daily as directed Dx:250.02  . Insulin Glargine (LANTUS SOLOSTAR) 100 UNIT/ML SOPN Inject 30 Units into the skin at bedtime.  . Lancets MISC Test blood sugar twice daily as directed. Dx:250.02  . losartan (COZAAR) 100 MG tablet TAKE 1 TABLET DAILY FOR HYPERTENSION.  . rosuvastatin  (CRESTOR) 10 MG tablet Take 1 tablet (10 mg total) by mouth daily.  . sitaGLIPtin (JANUVIA) 100 MG tablet Take 1 tablet (100 mg total) by mouth daily.  . [DISCONTINUED] mometasone (NASONEX) 50 MCG/ACT nasal spray 2 sprays by Nasal route daily.    . [DISCONTINUED] potassium chloride (K-DUR) 10 MEQ tablet Take 1 tablet (10 mEq total) by mouth 2 (two) times daily.    Wilder Glade; Metformin and related; Atorvastatin; and Benazepril hcl  Past Medical History  Diagnosis Date  . Hyperlipidemia   . Hypertension   . Diabetes mellitus     type 2  . Hypogonadism, male   . Pancreatitis 2008  . Renal insufficiency     No past surgical history on file.  History   Social History  . Marital Status: Married    Spouse Name: N/A    Number of Children: N/A  . Years of Education: N/A   Occupational History  . retired    Social History Main Topics  . Smoking status: Former Smoker    Quit date: 11/30/1974  . Smokeless tobacco: Not on file  . Alcohol Use: 1.8 oz/week    3 Glasses of wine per week  . Drug Use: No  . Sexual Activity: Yes   Other Topics Concern  . Not on file   Social History Narrative   Regular exercise-yes    Family History  Problem Relation Age of Onset  . Hypertension Other     ROS:  General: no fevers/chills/night sweats Eyes: no blurry vision, diplopia, or amaurosis ENT: no sore throat or hearing loss Resp: no cough, wheezing, or hemoptysis  CV: no edema or palpitations GI: no abdominal pain, nausea, vomiting, diarrhea, or constipation GU: no dysuria, frequency, or hematuria Skin: no rash Neuro: no headache, numbness, tingling, or weakness of extremities Musculoskeletal: no joint pain or swelling Heme: no bleeding, DVT, or easy bruising Endo: no polydipsia or polyuria  BP 150/86  Pulse 55  Ht 5\' 11"  (1.803 m)  Wt 235 lb (106.595 kg)  BMI 32.79 kg/m2  PHYSICAL EXAM: Pt is alert and oriented, WD, WN, in no distress. HEENT: normal Neck: JVP normal.  Carotid upstrokes normal with bilateral bruits (transmitted murmur). No thyromegaly. Lungs: equal expansion, clear bilaterally CV: Apex is discrete and nondisplaced, RRR with grade 3/6 harsh systolic crescendo decrescendo murmur, mid peaking, best heard at right upper sternal border, A2 is intact.  Abd: soft, NT, +BS, no bruit, no hepatosplenomegaly, obese Back: no CVA tenderness Ext: no C/C/E        DP/PT pulses intact and = Skin: warm and dry without rash Neuro: CNII-XII intact             Strength intact = bilaterally  EKG:  Sinus bradycardia 55 beats per minute, PACs, nonspecific T wave abnormality.  2D Echo 11/09/2013: Left ventricle: The cavity size was normal. Wall thickness was increased in a pattern of severe LVH. Systolic function was normal. The estimated ejection fraction was in the range of 55% to 60%.  ------------------------------------------------------------ Aortic valve: Cannot tell if valve is trileaflet or not Moderately calcified leaflets. Doppler: There was moderate stenosis. Mild regurgitation. VTI ratio of LVOT to aortic valve: 0.29. Valve area: 1.2cm^2(VTI). Indexed valve area: 0.53cm^2/m^2 (VTI). Peak velocity ratio of LVOT to aortic valve: 0.3. Valve area: 1.26cm^2 (Vmax). Indexed valve area: 0.56cm^2/m^2 (Vmax). Mean gradient: 67mm Hg (S). Peak gradient: 26mm Hg (S).  ------------------------------------------------------------ Mitral valve: Calcified annulus. Mildly thickened leaflets . Doppler: No significant regurgitation.  ------------------------------------------------------------ Left atrium: The atrium was mildly dilated.  ------------------------------------------------------------ Atrial septum: No defect or patent foramen ovale was identified.  ------------------------------------------------------------ Right ventricle: The cavity size was normal. Wall thickness was normal. Systolic function was  normal.  ------------------------------------------------------------ Pulmonic valve: Structurally normal valve. Cusp separation was normal. Doppler: Transvalvular velocity was within the normal range. Trivial regurgitation.  ------------------------------------------------------------ Tricuspid valve: Structurally normal valve. Leaflet separation was normal. Doppler: Transvalvular velocity was within the normal range. Mild regurgitation.  ------------------------------------------------------------ Right atrium: The atrium was mildly dilated.  ------------------------------------------------------------ Pericardium: The pericardium was normal in appearance.  ------------------------------------------------------------  2D measurements Normal Doppler measurements Normal Left ventricle Left ventricle LVID ED, 41.5 mm 43-52 Ea, lat 7.99 cm/s ------ chord, ann, tiss PLAX DP LVID ES, 21.5 mm 23-38 E/Ea, lat 7.2 ------ chord, ann, tiss PLAX DP FS, chord, 48 % >29 Ea, med 6.34 cm/s ------ PLAX ann, tiss LVPW, ED 16.1 mm ------ DP IVS/LVPW 1.16 <1.3 E/Ea, med 9.07 ------ ratio, ED ann, tiss Ventricular septum DP IVS, ED 18.6 mm ------ LVOT LVOT Peak vel, 116 cm/s ------ Diam, S 23 mm ------ S Area 4.15 cm^2 ------ VTI, S 28.6 cm ------ Diam 23 mm ------ Peak 5 mm Hg ------ Aorta gradient, Root diam, 39 mm ------ S ED Stroke vol 118. ml ------ AAo AP 37 mm ------ 8 diam, S Stroke 52.6 ml/m^2 ------ Left atrium index AP dim 42 mm ------ Aortic valve AP dim 1.86 cm/m^2 <2.2 Peak vel, 383 cm/s ------ index S Mean vel, 254 cm/s ------ S VTI, S 99.1 cm ------ Mean 31 mm Hg ------ gradient, S Peak 59 mm Hg ------ gradient,  S VTI ratio 0.29 ------ LVOT/AV Area, VTI 1.2 cm^2 ------ Area index 0.53 cm^2/m ------ (VTI) ^2 Peak vel 0.3 ------ ratio, LVOT/AV Area, Vmax 1.26 cm^2 ------ Area index 0.56 cm^2/m ------ (Vmax) ^2 Mitral valve Peak E vel 57.5 cm/s  ------ Peak A vel 62.9 cm/s ------ Decelerati 335 ms 150-23 on time 0 Peak E/A 0.9 ------ ratio Right ventricle Sa vel, 12 cm/s ------ lat ann, tiss DP   ASSESSMENT AND PLAN: 73 year old gentleman with aortic stenosis. He is clinically asymptomatic. Comorbid conditions include hypertension and diabetes. We discussed the natural history of aortic stenosis. We reviewed symptoms of chest pain or pressure, shortness of breath, or dizziness/presyncope. He understands that these symptoms can be related to aortic stenosis. I think at this point continued observation is appropriate. I will plan on seeing him back in one year with an echocardiogram before that visit.  Reviewed in detail that we would predict slowly progressive changes and ultimately a need for aortic valve replacement if he becomes symptomatic. Stressed the importance of diet, exercise, and weight loss.   I appreciate the opportunity to see this nice gentleman and look forward to seeing him back in one year. His hypertension, diabetes, and hyperlipidemia are all well-controlled and treated by his primary care physician, Dr. Ronnald Ramp.  Sherren Mocha 12/20/2013 5:54 PM

## 2014-01-09 ENCOUNTER — Other Ambulatory Visit: Payer: Self-pay

## 2014-01-09 MED ORDER — ATENOLOL-CHLORTHALIDONE 100-25 MG PO TABS: ORAL_TABLET | ORAL | Status: DC

## 2014-01-11 ENCOUNTER — Other Ambulatory Visit: Payer: Self-pay

## 2014-01-11 MED ORDER — INSULIN GLARGINE 100 UNIT/ML SOLOSTAR PEN
30.0000 [IU] | PEN_INJECTOR | Freq: Every day | SUBCUTANEOUS | Status: DC
Start: 2014-01-11 — End: 2014-03-06

## 2014-02-22 ENCOUNTER — Telehealth: Payer: Self-pay

## 2014-02-22 MED ORDER — INSULIN DETEMIR 100 UNIT/ML FLEXPEN: 30.0000 [IU] | PEN_INJECTOR | Freq: Every day | SUBCUTANEOUS | Status: DC

## 2014-02-22 NOTE — Telephone Encounter (Signed)
Patient advised to pick up levemir samples

## 2014-02-22 NOTE — Telephone Encounter (Signed)
OK if he would like to switch. It is 1:1 substution Thx

## 2014-02-22 NOTE — Telephone Encounter (Signed)
Patient called requesting samples of lantus due to unable to afford. No samples available at this time and pt is completely out, please advise on levemir. Thanks

## 2014-03-06 ENCOUNTER — Encounter: Payer: Self-pay | Admitting: Internal Medicine

## 2014-03-06 ENCOUNTER — Other Ambulatory Visit (INDEPENDENT_AMBULATORY_CARE_PROVIDER_SITE_OTHER): Payer: Medicare Other

## 2014-03-06 ENCOUNTER — Ambulatory Visit (INDEPENDENT_AMBULATORY_CARE_PROVIDER_SITE_OTHER): Payer: Medicare Other | Admitting: Internal Medicine

## 2014-03-06 VITALS — BP 132/70 | HR 57 | Temp 98.1°F | Resp 16 | Wt 237.1 lb

## 2014-03-06 DIAGNOSIS — E1129 Type 2 diabetes mellitus with other diabetic kidney complication: Secondary | ICD-10-CM

## 2014-03-06 DIAGNOSIS — E1165 Type 2 diabetes mellitus with hyperglycemia: Secondary | ICD-10-CM

## 2014-03-06 DIAGNOSIS — I10 Essential (primary) hypertension: Secondary | ICD-10-CM

## 2014-03-06 DIAGNOSIS — Z23 Encounter for immunization: Secondary | ICD-10-CM

## 2014-03-06 DIAGNOSIS — N529 Male erectile dysfunction, unspecified: Secondary | ICD-10-CM

## 2014-03-06 DIAGNOSIS — N259 Disorder resulting from impaired renal tubular function, unspecified: Secondary | ICD-10-CM

## 2014-03-06 LAB — BASIC METABOLIC PANEL
BUN: 19 mg/dL (ref 6–23)
CALCIUM: 10 mg/dL (ref 8.4–10.5)
CO2: 31 meq/L (ref 19–32)
Chloride: 102 mEq/L (ref 96–112)
Creatinine, Ser: 1.3 mg/dL (ref 0.4–1.5)
GFR: 56.69 mL/min — ABNORMAL LOW (ref >60.00)
GLUCOSE: 132 mg/dL — AB (ref 70–99)
Potassium: 3.9 mEq/L (ref 3.5–5.1)
Sodium: 140 mEq/L (ref 135–145)

## 2014-03-06 LAB — HEMOGLOBIN A1C: Hgb A1c MFr Bld: 6.9 % — ABNORMAL HIGH (ref 4.6–6.5)

## 2014-03-06 MED ORDER — VARDENAFIL HCL 20 MG PO TABS
20.0000 mg | ORAL_TABLET | Freq: Every day | ORAL | Status: DC | PRN
Start: 2014-03-06 — End: 2015-07-04

## 2014-03-06 MED ORDER — INSULIN DETEMIR 100 UNIT/ML FLEXPEN: 30.0000 [IU] | PEN_INJECTOR | Freq: Every day | SUBCUTANEOUS | Status: DC

## 2014-03-06 NOTE — Progress Notes (Signed)
Pre visit review using our clinic review tool, if applicable. No additional management support is needed unless otherwise documented below in the visit note. 

## 2014-03-06 NOTE — Assessment & Plan Note (Signed)
His blood sugars are well controlled 

## 2014-03-06 NOTE — Progress Notes (Signed)
Subjective:    Patient ID: Ryan Duke, male    DOB: Sep 08, 1941, 73 y.o.   MRN: [id number]  Hypertension This is a chronic problem. The current episode started more than 1 year ago. The problem is unchanged. The problem is controlled. Pertinent negatives include no anxiety, blurred vision, chest pain, headaches, malaise/fatigue, neck pain, orthopnea, palpitations, peripheral edema, PND, shortness of breath or sweats. There are no associated agents to hypertension. Past treatments include beta blockers and diuretics. The current treatment provides moderate improvement. Compliance problems include exercise and diet.       Review of Systems  Constitutional: Negative.  Negative for fever, chills, malaise/fatigue, diaphoresis, appetite change and fatigue.  HENT: Negative.   Eyes: Negative.  Negative for blurred vision.  Respiratory: Negative.  Negative for cough, choking, chest tightness, shortness of breath, wheezing and stridor.   Cardiovascular: Negative.  Negative for chest pain, palpitations, orthopnea, leg swelling and PND.  Gastrointestinal: Negative.  Negative for nausea, vomiting, abdominal pain, diarrhea, constipation and blood in stool.  Endocrine: Negative.  Negative for polydipsia, polyphagia and polyuria.  Genitourinary: Negative.  Negative for dysuria, frequency, hematuria, flank pain and difficulty urinating.  Musculoskeletal: Negative.  Negative for arthralgias, back pain, myalgias, neck pain and neck stiffness.  Skin: Negative.   Allergic/Immunologic: Negative.   Neurological: Negative.  Negative for dizziness, tremors, syncope, weakness, light-headedness, numbness and headaches.  Hematological: Negative.  Negative for adenopathy. Does not bruise/bleed easily.  Psychiatric/Behavioral: Negative.        Objective:   Physical Exam  Vitals reviewed. Constitutional: He is oriented to person, place, and time. He appears well-developed and well-nourished. No distress.    HENT:  Head: Normocephalic and atraumatic.  Mouth/Throat: Oropharynx is clear and moist. No oropharyngeal exudate.  Eyes: Conjunctivae are normal. Right eye exhibits no discharge. Left eye exhibits no discharge. No scleral icterus.  Neck: Normal range of motion. Neck supple. No JVD present. No tracheal deviation present. No thyromegaly present.  Cardiovascular: Normal rate, regular rhythm, S1 normal, S2 normal and intact distal pulses.  Exam reveals no gallop.   Murmur heard.  Decrescendo systolic murmur is present with a grade of 1/6   No diastolic murmur is present  Pulses:      Carotid pulses are 1+ on the right side, and 1+ on the left side.      Radial pulses are 1+ on the right side, and 1+ on the left side.       Femoral pulses are 1+ on the right side, and 1+ on the left side.      Popliteal pulses are 1+ on the right side, and 1+ on the left side.       Dorsalis pedis pulses are 1+ on the right side, and 1+ on the left side.       Posterior tibial pulses are 1+ on the right side, and 1+ on the left side.  Pulmonary/Chest: Effort normal and breath sounds normal. No stridor. No respiratory distress. He has no wheezes. He has no rales. He exhibits no tenderness.  Abdominal: Soft. Bowel sounds are normal. He exhibits no distension and no mass. There is no tenderness. There is no rebound and no guarding.  Musculoskeletal: Normal range of motion. He exhibits no edema and no tenderness.  Lymphadenopathy:    He has no cervical adenopathy.  Neurological: He is oriented to person, place, and time.  Skin: Skin is warm and dry. No rash noted. He is not diaphoretic. No erythema. No  pallor.  Psychiatric: He has a normal mood and affect. His behavior is normal. Judgment and thought content normal.     Lab Results  Component Value Date   WBC 8.7 10/18/2013   HGB 16.0 10/18/2013   HCT 47.5 10/18/2013   PLT 271.0 10/18/2013   GLUCOSE 123* 10/18/2013   CHOL 131 10/18/2013   TRIG 114.0  10/18/2013   HDL 45.60 10/18/2013   LDLDIRECT 100.0 06/13/2009   LDLCALC 63 10/18/2013   ALT 25 10/18/2013   AST 25 10/18/2013   NA 138 10/18/2013   K 4.1 10/18/2013   CL 103 10/18/2013   CREATININE 1.3 10/18/2013   BUN 15 10/18/2013   CO2 29 10/18/2013   TSH 2.82 10/18/2013   PSA 0.71 10/17/2012   HGBA1C 6.9* 10/18/2013       Assessment & Plan:

## 2014-03-06 NOTE — Patient Instructions (Signed)
Type 2 Diabetes Mellitus, Adult Type 2 diabetes mellitus, often simply referred to as type 2 diabetes, is a long-lasting (chronic) disease. In type 2 diabetes, the pancreas does not make enough insulin (a hormone), the cells are less responsive to the insulin that is made (insulin resistance), or both. Normally, insulin moves sugars from food into the tissue cells. The tissue cells use the sugars for energy. The lack of insulin or the lack of normal response to insulin causes excess sugars to build up in the blood instead of going into the tissue cells. As a result, high blood sugar (hyperglycemia) develops. The effect of high sugar (glucose) levels can cause many complications. Type 2 diabetes was also previously called adult-onset diabetes but it can occur at any age.  RISK FACTORS  A person is predisposed to developing type 2 diabetes if someone in the family has the disease and also has one or more of the following primary risk factors:  Overweight.  An inactive lifestyle.  A history of consistently eating high-calorie foods. Maintaining a normal weight and regular physical activity can reduce the chance of developing type 2 diabetes. SYMPTOMS  A person with type 2 diabetes may not show symptoms initially. The symptoms of type 2 diabetes appear slowly. The symptoms include:  Increased thirst (polydipsia).  Increased urination (polyuria).  Increased urination during the night (nocturia).  Weight loss. This weight loss may be rapid.  Frequent, recurring infections.  Tiredness (fatigue).  Weakness.  Vision changes, such as blurred vision.  Fruity smell to your breath.  Abdominal pain.  Nausea or vomiting.  Cuts or bruises which are slow to heal.  Tingling or numbness in the hands or feet. DIAGNOSIS Type 2 diabetes is frequently not diagnosed until complications of diabetes are present. Type 2 diabetes is diagnosed when symptoms or complications are present and when blood  glucose levels are increased. Your blood glucose level may be checked by one or more of the following blood tests:  A fasting blood glucose test. You will not be allowed to eat for at least 8 hours before a blood sample is taken.  A random blood glucose test. Your blood glucose is checked at any time of the day regardless of when you ate.  A hemoglobin A1c blood glucose test. A hemoglobin A1c test provides information about blood glucose control over the previous 3 months.  An oral glucose tolerance test (OGTT). Your blood glucose is measured after you have not eaten (fasted) for 2 hours and then after you drink a glucose-containing beverage. TREATMENT   You may need to take insulin or diabetes medicine daily to keep blood glucose levels in the desired range.  You will need to match insulin dosing with exercise and healthy food choices. The treatment goal is to maintain the before meal blood sugar (preprandial glucose) level at 70 130 mg/dL. HOME CARE INSTRUCTIONS   Have your hemoglobin A1c level checked twice a year.  Perform daily blood glucose monitoring as directed by your caregiver.  Monitor urine ketones when you are ill and as directed by your caregiver.  Take your diabetes medicine or insulin as directed by your caregiver to maintain your blood glucose levels in the desired range.  Never run out of diabetes medicine or insulin. It is needed every day.  Adjust insulin based on your intake of carbohydrates. Carbohydrates can raise blood glucose levels but need to be included in your diet. Carbohydrates provide vitamins, minerals, and fiber which are an essential part of   a healthy diet. Carbohydrates are found in fruits, vegetables, whole grains, dairy products, legumes, and foods containing added sugars.    Eat healthy foods. Alternate 3 meals with 3 snacks.  Lose weight if overweight.  Carry a medical alert card or wear your medical alert jewelry.  Carry a 15 gram  carbohydrate snack with you at all times to treat low blood glucose (hypoglycemia). Some examples of 15 gram carbohydrate snacks include:  Glucose tablets, 3 or 4   Glucose gel, 15 gram tube  Raisins, 2 tablespoons (24 grams)  Jelly beans, 6  Animal crackers, 8  Regular pop, 4 ounces (120 mL)  Gummy treats, 9  Recognize hypoglycemia. Hypoglycemia occurs with blood glucose levels of 70 mg/dL and below. The risk for hypoglycemia increases when fasting or skipping meals, during or after intense exercise, and during sleep. Hypoglycemia symptoms can include:  Tremors or shakes.  Decreased ability to concentrate.  Sweating.  Increased heart rate.  Headache.  Dry mouth.  Hunger.  Irritability.  Anxiety.  Restless sleep.  Altered speech or coordination.  Confusion.  Treat hypoglycemia promptly. If you are alert and able to safely swallow, follow the 15:15 rule:  Take 15 20 grams of rapid-acting glucose or carbohydrate. Rapid-acting options include glucose gel, glucose tablets, or 4 ounces (120 mL) of fruit juice, regular soda, or low fat milk.  Check your blood glucose level 15 minutes after taking the glucose.  Take 15 20 grams more of glucose if the repeat blood glucose level is still 70 mg/dL or below.  Eat a meal or snack within 1 hour once blood glucose levels return to normal.    Be alert to polyuria and polydipsia which are early signs of hyperglycemia. An early awareness of hyperglycemia allows for prompt treatment. Treat hyperglycemia as directed by your caregiver.  Engage in at least 150 minutes of moderate-intensity physical activity a week, spread over at least 3 days of the week or as directed by your caregiver. In addition, you should engage in resistance exercise at least 2 times a week or as directed by your caregiver.  Adjust your medicine and food intake as needed if you start a new exercise or sport.  Follow your sick day plan at any time you  are unable to eat or drink as usual.  Avoid tobacco use.  Limit alcohol intake to no more than 1 drink per day for nonpregnant women and 2 drinks per day for men. You should drink alcohol only when you are also eating food. Talk with your caregiver whether alcohol is safe for you. Tell your caregiver if you drink alcohol several times a week.  Follow up with your caregiver regularly.  Schedule an eye exam soon after the diagnosis of type 2 diabetes and then annually.  Perform daily skin and foot care. Examine your skin and feet daily for cuts, bruises, redness, nail problems, bleeding, blisters, or sores. A foot exam by a caregiver should be done annually.  Brush your teeth and gums at least twice a day and floss at least once a day. Follow up with your dentist regularly.  Share your diabetes management plan with your workplace or school.  Stay up-to-date with immunizations.  Learn to manage stress.  Obtain ongoing diabetes education and support as needed.  Participate in, or seek rehabilitation as needed to maintain or improve independence and quality of life. Request a physical or occupational therapy referral if you are having foot or hand numbness or difficulties with grooming,   dressing, eating, or physical activity. SEEK MEDICAL CARE IF:   You are unable to eat food or drink fluids for more than 6 hours.  You have nausea and vomiting for more than 6 hours.  Your blood glucose level is over 240 mg/dL.  There is a change in mental status.  You develop an additional serious illness.  You have diarrhea for more than 6 hours.  You have been sick or have had a fever for a couple of days and are not getting better.  You have pain during any physical activity.  SEEK IMMEDIATE MEDICAL CARE IF:  You have difficulty breathing.  You have moderate to large ketone levels. MAKE SURE YOU:  Understand these instructions.  Will watch your condition.  Will get help right away if  you are not doing well or get worse. Document Released: 11/16/2005 Document Revised: 08/10/2012 Document Reviewed: 06/14/2012 [employer] Patient Information 2014 [employer].  

## 2014-03-06 NOTE — Progress Notes (Signed)
.  lbs

## 2014-03-06 NOTE — Assessment & Plan Note (Signed)
His BP is well controlled His lytes and renal function are stable 

## 2014-03-07 ENCOUNTER — Telehealth: Payer: Self-pay | Admitting: Internal Medicine

## 2014-03-07 NOTE — Telephone Encounter (Signed)
Relevant patient education assigned to patient using Emmi. ° °

## 2014-03-11 ENCOUNTER — Other Ambulatory Visit: Payer: Self-pay | Admitting: Internal Medicine

## 2014-03-11 DIAGNOSIS — E1165 Type 2 diabetes mellitus with hyperglycemia: Principal | ICD-10-CM

## 2014-03-11 DIAGNOSIS — E785 Hyperlipidemia, unspecified: Secondary | ICD-10-CM

## 2014-03-11 DIAGNOSIS — IMO0001 Reserved for inherently not codable concepts without codable children: Secondary | ICD-10-CM

## 2014-03-11 MED ORDER — ROSUVASTATIN CALCIUM 10 MG PO TABS: 10.0000 mg | ORAL_TABLET | Freq: Every day | ORAL | Status: DC

## 2014-03-13 ENCOUNTER — Telehealth: Payer: Self-pay

## 2014-03-13 DIAGNOSIS — E1129 Type 2 diabetes mellitus with other diabetic kidney complication: Secondary | ICD-10-CM

## 2014-03-13 DIAGNOSIS — E1165 Type 2 diabetes mellitus with hyperglycemia: Principal | ICD-10-CM

## 2014-03-13 MED ORDER — INSULIN DETEMIR 100 UNIT/ML FLEXPEN: 30.0000 [IU] | PEN_INJECTOR | Freq: Every day | SUBCUTANEOUS | Status: DC

## 2014-03-13 NOTE — Telephone Encounter (Signed)
Ryan Duke called and requested sample of Levemir Flex Touch.

## 2014-03-14 ENCOUNTER — Telehealth: Payer: Self-pay

## 2014-03-14 ENCOUNTER — Other Ambulatory Visit: Payer: Self-pay

## 2014-03-14 DIAGNOSIS — E785 Hyperlipidemia, unspecified: Secondary | ICD-10-CM

## 2014-03-14 DIAGNOSIS — E1165 Type 2 diabetes mellitus with hyperglycemia: Principal | ICD-10-CM

## 2014-03-14 DIAGNOSIS — IMO0001 Reserved for inherently not codable concepts without codable children: Secondary | ICD-10-CM

## 2014-03-14 MED ORDER — ROSUVASTATIN CALCIUM 10 MG PO TABS: 10.0000 mg | ORAL_TABLET | Freq: Every day | ORAL | Status: DC

## 2014-03-14 NOTE — Telephone Encounter (Signed)
Pt called and requested samples of crestor. Changed the order type from sample to normal and sent escrip to the pharmacy.

## 2014-04-16 ENCOUNTER — Other Ambulatory Visit: Payer: Self-pay | Admitting: Internal Medicine

## 2014-06-14 LAB — HM DIABETES EYE EXAM

## 2014-07-06 ENCOUNTER — Ambulatory Visit: Payer: Medicare Other | Admitting: Internal Medicine

## 2014-07-17 ENCOUNTER — Encounter: Payer: Self-pay | Admitting: Internal Medicine

## 2014-07-17 ENCOUNTER — Other Ambulatory Visit (INDEPENDENT_AMBULATORY_CARE_PROVIDER_SITE_OTHER): Payer: Medicare Other

## 2014-07-17 ENCOUNTER — Ambulatory Visit (INDEPENDENT_AMBULATORY_CARE_PROVIDER_SITE_OTHER): Payer: Medicare Other | Admitting: Internal Medicine

## 2014-07-17 VITALS — BP 140/90 | HR 72 | Temp 98.2°F | Resp 16 | Ht 71.0 in | Wt 230.0 lb

## 2014-07-17 DIAGNOSIS — E785 Hyperlipidemia, unspecified: Secondary | ICD-10-CM

## 2014-07-17 DIAGNOSIS — I10 Essential (primary) hypertension: Secondary | ICD-10-CM

## 2014-07-17 DIAGNOSIS — E1165 Type 2 diabetes mellitus with hyperglycemia: Secondary | ICD-10-CM

## 2014-07-17 DIAGNOSIS — E1129 Type 2 diabetes mellitus with other diabetic kidney complication: Secondary | ICD-10-CM

## 2014-07-17 DIAGNOSIS — N259 Disorder resulting from impaired renal tubular function, unspecified: Secondary | ICD-10-CM

## 2014-07-17 DIAGNOSIS — I359 Nonrheumatic aortic valve disorder, unspecified: Secondary | ICD-10-CM

## 2014-07-17 LAB — COMPREHENSIVE METABOLIC PANEL
ALK PHOS: 57 U/L (ref 39–117)
ALT: 21 U/L (ref 0–53)
AST: 20 U/L (ref 0–37)
Albumin: 4 g/dL (ref 3.5–5.2)
BUN: 14 mg/dL (ref 6–23)
CO2: 30 mEq/L (ref 19–32)
Calcium: 9.5 mg/dL (ref 8.4–10.5)
Chloride: 103 mEq/L (ref 96–112)
Creatinine, Ser: 1.3 mg/dL (ref 0.4–1.5)
GFR: 59.24 mL/min — ABNORMAL LOW (ref >60.00)
Glucose, Bld: 119 mg/dL — ABNORMAL HIGH (ref 70–99)
Potassium: 3.9 mEq/L (ref 3.5–5.1)
SODIUM: 140 meq/L (ref 135–145)
TOTAL PROTEIN: 7.3 g/dL (ref 6.0–8.3)
Total Bilirubin: 1 mg/dL (ref 0.2–1.2)

## 2014-07-17 LAB — LIPID PANEL
Cholesterol: 126 mg/dL (ref 0–200)
HDL: 43.1 mg/dL
LDL Cholesterol: 66 mg/dL (ref 0–99)
NonHDL: 82.9
Total CHOL/HDL Ratio: 3
Triglycerides: 84 mg/dL (ref 0.0–149.0)
VLDL: 16.8 mg/dL (ref 0.0–40.0)

## 2014-07-17 LAB — HEMOGLOBIN A1C: Hgb A1c MFr Bld: 7 % — ABNORMAL HIGH (ref 4.6–6.5)

## 2014-07-17 NOTE — Progress Notes (Signed)
Subjective:    Patient ID: Ryan Duke, male    DOB: [birth year], 73 y.o.   MRN: [id number]  Hypertension This is a chronic problem. The current episode started more than 1 year ago. The problem is unchanged. The problem is controlled. Pertinent negatives include no anxiety, blurred vision, chest pain, headaches, malaise/fatigue, neck pain, orthopnea, palpitations, peripheral edema, PND, shortness of breath or sweats. There are no associated agents to hypertension. Past treatments include angiotensin blockers, beta blockers and diuretics. The current treatment provides moderate improvement. There are no compliance problems.  Hypertensive end-organ damage includes PVD.      Review of Systems  Constitutional: Negative.  Negative for fever, chills, malaise/fatigue, diaphoresis, appetite change and fatigue.  HENT: Negative.   Eyes: Negative.  Negative for blurred vision.  Respiratory: Negative.  Negative for cough, choking, chest tightness, shortness of breath, wheezing and stridor.   Cardiovascular: Negative.  Negative for chest pain, palpitations, orthopnea, leg swelling and PND.  Gastrointestinal: Negative.  Negative for nausea, vomiting, abdominal pain, diarrhea, constipation and blood in stool.  Endocrine: Negative.  Negative for polydipsia, polyphagia and polyuria.  Genitourinary: Negative.   Musculoskeletal: Negative.  Negative for arthralgias, back pain, myalgias and neck pain.  Skin: Negative.  Negative for rash.  Allergic/Immunologic: Negative.   Neurological: Negative.  Negative for dizziness, syncope, facial asymmetry, speech difficulty, weakness, light-headedness, numbness and headaches.  Hematological: Negative.  Negative for adenopathy. Does not bruise/bleed easily.  Psychiatric/Behavioral: Negative.        Objective:   Physical Exam  Vitals reviewed. Constitutional: He is oriented to person, place, and time. He appears well-developed and well-nourished. No distress.    HENT:  Head: Normocephalic and atraumatic.  Nose: Nose normal.  Mouth/Throat: Oropharynx is clear and moist. No oropharyngeal exudate.  Eyes: Conjunctivae are normal. Right eye exhibits no discharge. Left eye exhibits no discharge. No scleral icterus.  Neck: Normal range of motion. Neck supple. No JVD present. No tracheal deviation present. No thyromegaly present.  Cardiovascular: Normal rate, regular rhythm, S1 normal, S2 normal and intact distal pulses.  Exam reveals no gallop and no friction rub.   Murmur heard.  Decrescendo systolic murmur is present with a grade of 2/6   No diastolic murmur is present  Pulses:      Carotid pulses are 1+ on the right side, and 1+ on the left side.      Radial pulses are 1+ on the right side, and 1+ on the left side.       Femoral pulses are 1+ on the right side, and 1+ on the left side.      Popliteal pulses are 1+ on the right side, and 1+ on the left side.       Dorsalis pedis pulses are 1+ on the right side, and 1+ on the left side.       Posterior tibial pulses are 1+ on the right side, and 1+ on the left side.  Pulmonary/Chest: Effort normal and breath sounds normal. No stridor. No respiratory distress. He has no wheezes. He has no rales. He exhibits no tenderness.  Abdominal: Soft. Bowel sounds are normal. He exhibits no distension and no mass. There is no tenderness. There is no rebound and no guarding.  Musculoskeletal: Normal range of motion. He exhibits no edema and no tenderness.  Lymphadenopathy:    He has no cervical adenopathy.  Neurological: He is oriented to person, place, and time.  Skin: Skin is warm and dry. No rash  noted. He is not diaphoretic. No erythema. No pallor.          Assessment & Plan:

## 2014-07-17 NOTE — Patient Instructions (Signed)

## 2014-07-17 NOTE — Assessment & Plan Note (Signed)
His renal function is stable 

## 2014-07-17 NOTE — Assessment & Plan Note (Signed)
His blood sugars are adequately well controlled 

## 2014-07-17 NOTE — Assessment & Plan Note (Signed)
His BP is adequately well controlled 

## 2014-07-17 NOTE — Progress Notes (Signed)
Pre visit review using our clinic review tool, if applicable. No additional management support is needed unless otherwise documented below in the visit note. 

## 2014-07-17 NOTE — Assessment & Plan Note (Signed)
He has no s/s related to this  Will follow for now

## 2014-07-17 NOTE — Assessment & Plan Note (Signed)
He has achieved his LDL goal 

## 2014-07-23 ENCOUNTER — Other Ambulatory Visit: Payer: Self-pay

## 2014-07-23 DIAGNOSIS — E1129 Type 2 diabetes mellitus with other diabetic kidney complication: Secondary | ICD-10-CM

## 2014-07-23 DIAGNOSIS — E1165 Type 2 diabetes mellitus with hyperglycemia: Principal | ICD-10-CM

## 2014-07-23 MED ORDER — INSULIN DETEMIR 100 UNIT/ML FLEXPEN: 30.0000 [IU] | PEN_INJECTOR | Freq: Every day | SUBCUTANEOUS | Status: DC

## 2014-08-10 ENCOUNTER — Telehealth: Payer: Self-pay | Admitting: Internal Medicine

## 2014-08-10 ENCOUNTER — Telehealth: Payer: Self-pay

## 2014-08-10 NOTE — Telephone Encounter (Signed)
LVM for pt to call back.   RE: Scheduling nurse visit for bp recheck.

## 2014-08-10 NOTE — Telephone Encounter (Signed)
appt for bp check scheduled for 08/17/2014

## 2014-08-10 NOTE — Telephone Encounter (Signed)
Patient would like you to return his phone call.  He did not want to schedule a bp check with me.

## 2014-08-15 ENCOUNTER — Ambulatory Visit (INDEPENDENT_AMBULATORY_CARE_PROVIDER_SITE_OTHER): Payer: Medicare Other

## 2014-08-15 VITALS — BP 122/68

## 2014-08-15 DIAGNOSIS — Z23 Encounter for immunization: Secondary | ICD-10-CM

## 2014-08-15 DIAGNOSIS — Z0130 Encounter for examination of blood pressure without abnormal findings: Secondary | ICD-10-CM

## 2014-08-15 NOTE — Progress Notes (Signed)
Pre visit review using our clinic review tool, if applicable. No additional management support is needed unless otherwise documented below in the visit note.  BP manual 122/68 left arm, large cuff, sitting BP auto 167/91 right arm, normal cuff, sitting  Instructed pt on the correct cuff size and importance of taking medication daily. Pt understands.

## 2014-09-11 LAB — HM DIABETES EYE EXAM

## 2014-10-22 ENCOUNTER — Other Ambulatory Visit (INDEPENDENT_AMBULATORY_CARE_PROVIDER_SITE_OTHER): Payer: Medicare Other

## 2014-10-22 ENCOUNTER — Encounter: Payer: Self-pay | Admitting: Internal Medicine

## 2014-10-22 ENCOUNTER — Ambulatory Visit (INDEPENDENT_AMBULATORY_CARE_PROVIDER_SITE_OTHER): Payer: Medicare Other | Admitting: Internal Medicine

## 2014-10-22 VITALS — BP 130/80 | HR 52 | Temp 98.2°F | Ht 71.0 in | Wt 232.0 lb

## 2014-10-22 DIAGNOSIS — I359 Nonrheumatic aortic valve disorder, unspecified: Secondary | ICD-10-CM

## 2014-10-22 DIAGNOSIS — E118 Type 2 diabetes mellitus with unspecified complications: Secondary | ICD-10-CM

## 2014-10-22 DIAGNOSIS — Z0000 Encounter for general adult medical examination without abnormal findings: Secondary | ICD-10-CM

## 2014-10-22 DIAGNOSIS — E785 Hyperlipidemia, unspecified: Secondary | ICD-10-CM

## 2014-10-22 DIAGNOSIS — M1000 Idiopathic gout, unspecified site: Secondary | ICD-10-CM

## 2014-10-22 DIAGNOSIS — N400 Enlarged prostate without lower urinary tract symptoms: Secondary | ICD-10-CM

## 2014-10-22 DIAGNOSIS — M109 Gout, unspecified: Secondary | ICD-10-CM

## 2014-10-22 DIAGNOSIS — I10 Essential (primary) hypertension: Secondary | ICD-10-CM

## 2014-10-22 LAB — HEMOGLOBIN A1C: HEMOGLOBIN A1C: 6.9 % — AB (ref 4.6–6.5)

## 2014-10-22 LAB — BASIC METABOLIC PANEL
BUN: 14 mg/dL (ref 6–23)
CALCIUM: 9.9 mg/dL (ref 8.4–10.5)
CO2: 28 mEq/L (ref 19–32)
Chloride: 104 mEq/L (ref 96–112)
Creatinine, Ser: 1.1 mg/dL (ref 0.4–1.5)
GFR: 67.81 mL/min (ref >60.00)
GLUCOSE: 115 mg/dL — AB (ref 70–99)
POTASSIUM: 4.4 meq/L (ref 3.5–5.1)
SODIUM: 140 meq/L (ref 135–145)

## 2014-10-22 LAB — URINALYSIS, ROUTINE W REFLEX MICROSCOPIC
BILIRUBIN URINE: NEGATIVE
HGB URINE DIPSTICK: NEGATIVE
Ketones, ur: NEGATIVE
Leukocytes, UA: NEGATIVE
NITRITE: NEGATIVE
RBC / HPF: NONE SEEN (ref 0–?)
Specific Gravity, Urine: 1.025 (ref 1.000–1.030)
UROBILINOGEN UA: 0.2 (ref 0.0–1.0)
Urine Glucose: NEGATIVE
WBC, UA: NONE SEEN (ref 0–?)
pH: 6.5 (ref 5.0–8.0)

## 2014-10-22 LAB — URIC ACID: Uric Acid, Serum: 6.6 mg/dL (ref 4.0–7.8)

## 2014-10-22 LAB — PSA: PSA: 0.68 ng/mL (ref 0.10–4.00)

## 2014-10-22 MED ORDER — PRAVASTATIN SODIUM 40 MG PO TABS: 40.0000 mg | ORAL_TABLET | Freq: Every day | ORAL | Status: DC

## 2014-10-22 NOTE — Assessment & Plan Note (Signed)
The patient is here for annual Medicare wellness examination and management of other chronic and acute problems.   The risk factors are reflected in the social history.  The roster of all physicians providing medical care to patient - is listed in the Snapshot section of the chart.  Activities of daily living:  The patient is 100% inedpendent in all ADLs: dressing, toileting, feeding as well as independent mobility  Home safety : The patient has smoke detectors in the home. They wear seatbelts.No firearms at home ( firearms are present in the home, kept in a safe fashion). There is no violence in the home.   There is no risks for hepatitis, STDs or HIV. There is no   history of blood transfusion. They have no travel history to infectious disease endemic areas of the world.  The patient has (has not) seen their dentist in the last six month. They have (not) seen their eye doctor in the last year. They deny (admit to) any hearing difficulty and have not had audiologic testing in the last year.  They do not  have excessive sun exposure. Discussed the need for sun protection: hats, long sleeves and use of sunscreen if there is significant sun exposure.   Diet: the importance of a healthy diet is discussed. They do have a healthy (unhealthy-high fat/fast food) diet.  The patient has a regular exercise program.  Depression screen: there are no signs or vegative symptoms of depression- irritability, change in appetite, anhedonia, sadness/tearfullness.  Cognitive assessment: the patient manages all their financial and personal affairs and is actively engaged. They could relate day,date,year and events; recalled 3/3 objects at 3 minutes; performed clock-face test normally.  The following portions of the patient's history were reviewed and updated as appropriate: allergies, current medications, past family history, past medical history,  past surgical history, past social history  and problem  list.  Vision, hearing, body mass index were assessed and reviewed.   During the course of the visit the patient was educated and counseled about appropriate screening and preventive services including : fall prevention , diabetes screening, nutrition counseling, colorectal cancer screening, and recommended immunizations.

## 2014-10-22 NOTE — Progress Notes (Signed)
Pre visit review using our clinic review tool, if applicable. No additional management support is needed unless otherwise documented below in the visit note. 

## 2014-10-22 NOTE — Patient Instructions (Signed)

## 2014-10-22 NOTE — Assessment & Plan Note (Signed)
Will change crestor to a generic at his request

## 2014-10-22 NOTE — Assessment & Plan Note (Signed)
Will recheck his A1C and will treat if needed Will also monitor his renal function

## 2014-10-22 NOTE — Assessment & Plan Note (Signed)
His BP is well controlled I will monitor his lytes and renal function 

## 2014-10-22 NOTE — Addendum Note (Signed)
Addended by: Janith Lima on: 10/22/2014 01:27 PM   Modules accepted: Miquel Dunn

## 2014-10-22 NOTE — Progress Notes (Signed)
Subjective:    Patient ID: Ryan Duke, male    DOB: 1941-06-19, 73 y.o.   MRN: [id number]  Diabetes He presents for his follow-up diabetic visit. He has type 2 diabetes mellitus. His disease course has been stable. There are no hypoglycemic associated symptoms. Pertinent negatives for hypoglycemia include no dizziness, headaches or tremors. There are no diabetic associated symptoms. Pertinent negatives for diabetes include no blurred vision, no chest pain, no fatigue, no foot paresthesias, no foot ulcerations, no polydipsia, no polyphagia, no polyuria, no visual change, no weakness and no weight loss. There are no hypoglycemic complications. Symptoms are stable. Diabetic complications include nephropathy. Current diabetic treatment includes oral agent (monotherapy) and insulin injections. He is compliant with treatment all of the time. His weight is stable. He is following a generally healthy diet. Meal planning includes avoidance of concentrated sweets. He has not had a previous visit with a dietitian. He participates in exercise intermittently. There is no change in his home blood glucose trend. An ACE inhibitor/angiotensin II receptor blocker is being taken. He does not see a podiatrist.Eye exam is current.      Review of Systems  Constitutional: Negative.  Negative for fever, chills, weight loss, diaphoresis, appetite change and fatigue.  HENT: Negative.   Eyes: Negative.  Negative for blurred vision.  Respiratory: Negative.  Negative for cough, choking, chest tightness, shortness of breath and stridor.   Cardiovascular: Negative.  Negative for chest pain, palpitations and leg swelling.  Gastrointestinal: Negative.  Negative for nausea, vomiting, abdominal pain, diarrhea, constipation and blood in stool.  Endocrine: Negative.  Negative for polydipsia, polyphagia and polyuria.  Genitourinary: Negative.   Musculoskeletal: Negative.  Negative for back pain and arthralgias.  Skin:  Negative.  Negative for rash.  Allergic/Immunologic: Negative.   Neurological: Negative.  Negative for dizziness, tremors, syncope, weakness, light-headedness, numbness and headaches.  Hematological: Negative.  Negative for adenopathy. Does not bruise/bleed easily.  Psychiatric/Behavioral: Negative.        Objective:   Physical Exam  Constitutional: He is oriented to person, place, and time. He appears well-developed and well-nourished. No distress.  HENT:  Head: Normocephalic and atraumatic.  Mouth/Throat: Oropharynx is clear and moist. No oropharyngeal exudate.  Eyes: Conjunctivae are normal. Right eye exhibits no discharge. Left eye exhibits no discharge. No scleral icterus.  Neck: Normal range of motion. Neck supple. No JVD present. No tracheal deviation present. No thyromegaly present.  Cardiovascular: Normal rate, regular rhythm, S1 normal, S2 normal and intact distal pulses.  Exam reveals no gallop and no friction rub.   Murmur heard.  Decrescendo systolic murmur is present with a grade of 2/6   No diastolic murmur is present  Pulses:      Carotid pulses are 1+ on the right side, and 1+ on the left side.      Radial pulses are 1+ on the right side, and 1+ on the left side.       Femoral pulses are 1+ on the right side, and 1+ on the left side.      Popliteal pulses are 1+ on the right side, and 1+ on the left side.       Dorsalis pedis pulses are 1+ on the right side, and 1+ on the left side.       Posterior tibial pulses are 1+ on the right side, and 1+ on the left side.  Pulmonary/Chest: Effort normal and breath sounds normal. No stridor. No respiratory distress. He has no wheezes. He  has no rales. He exhibits no tenderness.  Abdominal: Soft. Bowel sounds are normal. He exhibits no distension and no mass. There is no tenderness. There is no rebound and no guarding.  Musculoskeletal: Normal range of motion. He exhibits edema (trace edema in BLE). He exhibits no tenderness.    Lymphadenopathy:    He has no cervical adenopathy.  Neurological: He is oriented to person, place, and time.  Skin: Skin is warm and dry. No rash noted. He is not diaphoretic. No erythema. No pallor.     Lab Results  Component Value Date   WBC 8.7 10/18/2013   HGB 16.0 10/18/2013   HCT 47.5 10/18/2013   PLT 271.0 10/18/2013   GLUCOSE 119* 07/17/2014   CHOL 126 07/17/2014   TRIG 84.0 07/17/2014   HDL 43.10 07/17/2014   LDLDIRECT 100.0 06/13/2009   LDLCALC 66 07/17/2014   ALT 21 07/17/2014   AST 20 07/17/2014   NA 140 07/17/2014   K 3.9 07/17/2014   CL 103 07/17/2014   CREATININE 1.3 07/17/2014   BUN 14 07/17/2014   CO2 30 07/17/2014   TSH 2.82 10/18/2013   PSA 0.71 10/17/2012   HGBA1C 7.0* 07/17/2014       Assessment & Plan:

## 2014-10-30 ENCOUNTER — Telehealth: Payer: Self-pay | Admitting: Internal Medicine

## 2014-10-30 NOTE — Telephone Encounter (Signed)
There are no samples for levemir. Patient stated that he is in the donut hole and can not afford to get levemir. Patient wants to know if we have any samples that he can substitute for levemir for right now?

## 2014-10-30 NOTE — Telephone Encounter (Addendum)
Patient is inquiring on getting samples of levemir one touch pens.

## 2014-10-31 ENCOUNTER — Other Ambulatory Visit: Payer: Self-pay | Admitting: Internal Medicine

## 2014-10-31 DIAGNOSIS — E118 Type 2 diabetes mellitus with unspecified complications: Secondary | ICD-10-CM

## 2014-10-31 MED ORDER — INSULIN GLARGINE 300 UNIT/ML ~~LOC~~ SOPN: 30.0000 [IU] | PEN_INJECTOR | Freq: Every day | SUBCUTANEOUS | Status: DC

## 2014-10-31 NOTE — Telephone Encounter (Signed)
Can change to toujeo - we have samples

## 2014-10-31 NOTE — Telephone Encounter (Signed)
Patient came by office and pick-up samples.

## 2014-12-18 ENCOUNTER — Telehealth: Payer: Self-pay

## 2014-12-18 NOTE — Telephone Encounter (Signed)
Received pharmacy rejection stating that insurance will not cover atenolol/chl [id number]  without a prior authorization. PA submitted to [facility], approval now pending their decision.

## 2014-12-31 ENCOUNTER — Ambulatory Visit: Payer: Medicare Other | Admitting: Cardiovascular Disease

## 2014-12-31 ENCOUNTER — Other Ambulatory Visit (HOSPITAL_COMMUNITY): Payer: Medicare Other

## 2015-01-14 ENCOUNTER — Other Ambulatory Visit (HOSPITAL_COMMUNITY): Payer: Medicare Other

## 2015-02-25 ENCOUNTER — Ambulatory Visit (HOSPITAL_COMMUNITY): Payer: Medicare Other | Attending: Internal Medicine | Admitting: Radiology

## 2015-02-25 ENCOUNTER — Other Ambulatory Visit (HOSPITAL_COMMUNITY): Payer: Self-pay | Admitting: Cardiovascular Disease

## 2015-02-25 DIAGNOSIS — I359 Nonrheumatic aortic valve disorder, unspecified: Secondary | ICD-10-CM

## 2015-02-25 NOTE — Progress Notes (Signed)
Echocardiogram performed.  

## 2015-03-04 ENCOUNTER — Encounter: Payer: Self-pay | Admitting: Internal Medicine

## 2015-03-04 ENCOUNTER — Other Ambulatory Visit (INDEPENDENT_AMBULATORY_CARE_PROVIDER_SITE_OTHER): Payer: Medicare Other

## 2015-03-04 ENCOUNTER — Ambulatory Visit (INDEPENDENT_AMBULATORY_CARE_PROVIDER_SITE_OTHER): Payer: Medicare Other | Admitting: Internal Medicine

## 2015-03-04 VITALS — BP 148/96 | HR 59 | Temp 98.1°F | Resp 16 | Ht 71.0 in | Wt 239.0 lb

## 2015-03-04 DIAGNOSIS — E118 Type 2 diabetes mellitus with unspecified complications: Secondary | ICD-10-CM | POA: Diagnosis not present

## 2015-03-04 DIAGNOSIS — I10 Essential (primary) hypertension: Secondary | ICD-10-CM

## 2015-03-04 DIAGNOSIS — N183 Chronic kidney disease, stage 3 unspecified: Secondary | ICD-10-CM

## 2015-03-04 LAB — BASIC METABOLIC PANEL
BUN: 14 mg/dL (ref 6–23)
CHLORIDE: 101 meq/L (ref 96–112)
CO2: 32 mEq/L (ref 19–32)
Calcium: 10 mg/dL (ref 8.4–10.5)
Creatinine, Ser: 1.3 mg/dL (ref 0.40–1.50)
GFR: 57.04 mL/min — ABNORMAL LOW (ref >60.00)
Glucose, Bld: 138 mg/dL — ABNORMAL HIGH (ref 70–99)
POTASSIUM: 4.5 meq/L (ref 3.5–5.1)
SODIUM: 139 meq/L (ref 135–145)

## 2015-03-04 LAB — URINALYSIS, ROUTINE W REFLEX MICROSCOPIC
Bilirubin Urine: NEGATIVE
Hgb urine dipstick: NEGATIVE
Ketones, ur: NEGATIVE
LEUKOCYTES UA: NEGATIVE
Nitrite: NEGATIVE
PH: 7 (ref 5.0–8.0)
RBC / HPF: NONE SEEN (ref 0–?)
SPECIFIC GRAVITY, URINE: 1.015 (ref 1.000–1.030)
Total Protein, Urine: 30 — AB
URINE GLUCOSE: NEGATIVE
Urobilinogen, UA: 0.2 (ref 0.0–1.0)

## 2015-03-04 LAB — MICROALBUMIN / CREATININE URINE RATIO
Creatinine,U: 76.7 mg/dL
MICROALB UR: 19.8 mg/dL — AB (ref 0.0–1.9)
MICROALB/CREAT RATIO: 25.8 mg/g (ref 0.0–30.0)

## 2015-03-04 LAB — HEMOGLOBIN A1C: Hgb A1c MFr Bld: 7.1 % — ABNORMAL HIGH (ref 4.6–6.5)

## 2015-03-04 MED ORDER — CHLORTHALIDONE 25 MG PO TABS: 25.0000 mg | ORAL_TABLET | Freq: Every day | ORAL | Status: DC

## 2015-03-04 MED ORDER — METOPROLOL SUCCINATE ER 100 MG PO TB24: 100.0000 mg | ORAL_TABLET | Freq: Every day | ORAL | Status: DC

## 2015-03-04 NOTE — Progress Notes (Signed)
Pre visit review using our clinic review tool, if applicable. No additional management support is needed unless otherwise documented below in the visit note. 

## 2015-03-04 NOTE — Patient Instructions (Signed)

## 2015-03-04 NOTE — Progress Notes (Signed)
Subjective:    Patient ID: Ryan Duke, male    DOB: 03-03-1941, 74 y.o.   MRN: [id number]  Hypertension This is a chronic problem. The current episode started more than 1 year ago. The problem has been gradually worsening since onset. The problem is uncontrolled. Associated symptoms include malaise/fatigue. Pertinent negatives include no anxiety, blurred vision, chest pain, headaches, neck pain, orthopnea, palpitations, peripheral edema, PND, shortness of breath or sweats. There are no associated agents to hypertension. Past treatments include angiotensin blockers, beta blockers and diuretics. Compliance problems include diet and exercise (hsi insurer will not pay for for tenoretic anymore).   Diabetes Pertinent negatives for hypoglycemia include no dizziness, headaches, sweats or tremors. Pertinent negatives for diabetes include no blurred vision, no chest pain, no fatigue, no polydipsia, no polyphagia, no polyuria and no weakness.      Review of Systems  Constitutional: Positive for malaise/fatigue and unexpected weight change (some wt gain). Negative for fever, chills, diaphoresis, appetite change and fatigue.  HENT: Negative.   Eyes: Negative.  Negative for blurred vision.  Respiratory: Negative.  Negative for cough, choking, chest tightness, shortness of breath and stridor.   Cardiovascular: Negative.  Negative for chest pain, palpitations, orthopnea, leg swelling and PND.  Gastrointestinal: Negative.  Negative for nausea, vomiting, abdominal pain, diarrhea, constipation and blood in stool.  Endocrine: Negative.  Negative for polydipsia, polyphagia and polyuria.  Genitourinary: Negative.   Musculoskeletal: Negative.  Negative for myalgias, back pain and neck pain.  Skin: Negative.  Negative for rash.  Allergic/Immunologic: Negative.   Neurological: Negative.  Negative for dizziness, tremors, syncope, weakness, light-headedness, numbness and headaches.  Hematological: Negative.   Negative for adenopathy. Does not bruise/bleed easily.  Psychiatric/Behavioral: Negative.        Objective:   Physical Exam  Constitutional: He is oriented to person, place, and time. He appears well-developed and well-nourished. No distress.  HENT:  Head: Normocephalic and atraumatic.  Mouth/Throat: Oropharynx is clear and moist. No oropharyngeal exudate.  Eyes: Conjunctivae are normal. Right eye exhibits no discharge. Left eye exhibits no discharge. No scleral icterus.  Neck: Normal range of motion. Neck supple. No JVD present. No tracheal deviation present. No thyromegaly present.  Cardiovascular: Normal rate, regular rhythm and intact distal pulses.  Exam reveals no gallop and no friction rub.   Murmur heard.  Decrescendo systolic murmur is present with a grade of 2/6   No diastolic murmur is present  Pulses:      Carotid pulses are 1+ on the right side, and 1+ on the left side.      Radial pulses are 1+ on the right side, and 1+ on the left side.       Femoral pulses are 1+ on the right side, and 1+ on the left side.      Popliteal pulses are 1+ on the right side, and 1+ on the left side.       Dorsalis pedis pulses are 1+ on the right side, and 1+ on the left side.       Posterior tibial pulses are 1+ on the right side, and 1+ on the left side.  Pulmonary/Chest: Effort normal and breath sounds normal. No stridor. No respiratory distress. He has no wheezes. He has no rales. He exhibits no tenderness.  Abdominal: Soft. Bowel sounds are normal. He exhibits no distension and no mass. There is no tenderness. There is no rebound and no guarding.  Musculoskeletal: Normal range of motion. He exhibits edema (trace  pitting edema in BLE). He exhibits no tenderness.  Lymphadenopathy:    He has no cervical adenopathy.  Neurological: He is oriented to person, place, and time.  Skin: Skin is warm and dry. No rash noted. He is not diaphoretic. No erythema. No pallor.  Vitals reviewed.   Lab  Results  Component Value Date   WBC 8.7 10/18/2013   HGB 16.0 10/18/2013   HCT 47.5 10/18/2013   PLT 271.0 10/18/2013   GLUCOSE 138* 03/04/2015   CHOL 126 07/17/2014   TRIG 84.0 07/17/2014   HDL 43.10 07/17/2014   LDLDIRECT 100.0 06/13/2009   LDLCALC 66 07/17/2014   ALT 21 07/17/2014   AST 20 07/17/2014   NA 139 03/04/2015   K 4.5 03/04/2015   CL 101 03/04/2015   CREATININE 1.30 03/04/2015   BUN 14 03/04/2015   CO2 32 03/04/2015   TSH 2.82 10/18/2013   PSA 0.68 10/22/2014   HGBA1C 7.1* 03/04/2015   MICROALBUR 19.8* 03/04/2015       Assessment & Plan:

## 2015-03-04 NOTE — Assessment & Plan Note (Signed)
His blood sugars have gone up some but not high enough to warrant any changes in meds He will work to improve on his exercise and wt loss regimens Will cont the current meds

## 2015-03-04 NOTE — Assessment & Plan Note (Signed)
Due to a formulary issue will change tenoretic to metoprolol and chlorthalidone He agrees to work on his lifestyle modifications Will cont the ARB

## 2015-03-04 NOTE — Assessment & Plan Note (Signed)
His renal function has declined slightly and there is mild microalbuminuria He will work to lose weight and will avoid nephrotoxic agents Will cont the ARB and get better control of the BP with the med changes today

## 2015-03-11 ENCOUNTER — Ambulatory Visit: Payer: Medicare Other | Admitting: Cardiovascular Disease

## 2015-03-25 ENCOUNTER — Telehealth: Payer: Self-pay | Admitting: Internal Medicine

## 2015-03-25 DIAGNOSIS — E118 Type 2 diabetes mellitus with unspecified complications: Secondary | ICD-10-CM

## 2015-03-25 NOTE — Telephone Encounter (Signed)
Patient is requesting call back.  He would not say what it is in regards to.

## 2015-03-26 MED ORDER — INSULIN GLARGINE 300 UNIT/ML ~~LOC~~ SOPN: 30.0000 [IU] | PEN_INJECTOR | Freq: Every day | SUBCUTANEOUS | Status: DC

## 2015-03-26 NOTE — Telephone Encounter (Signed)
Spoke with pt who wanted to inform the [employer] has denied his wife Wellbutrin. Patient also requested samples of Toujeo. I advised that sample available and that PA can take up to 78 hr depending insurance.

## 2015-04-13 ENCOUNTER — Other Ambulatory Visit: Payer: Self-pay | Admitting: Internal Medicine

## 2015-05-22 ENCOUNTER — Telehealth: Payer: Self-pay | Admitting: Internal Medicine

## 2015-05-22 NOTE — Telephone Encounter (Signed)
LVM for pt to call back as soon as possible.   RE: request below.

## 2015-05-22 NOTE — Telephone Encounter (Signed)
Pt called in and said that he wanted to speak with you.  Asked in reference to, he said Insulin.  Tried to get more info, refused to tell me and said that just need to speak to nurse.

## 2015-05-22 NOTE — Telephone Encounter (Signed)
Samples ready for pick up

## 2015-07-04 ENCOUNTER — Other Ambulatory Visit (INDEPENDENT_AMBULATORY_CARE_PROVIDER_SITE_OTHER): Payer: Medicare Other

## 2015-07-04 ENCOUNTER — Encounter: Payer: Self-pay | Admitting: Internal Medicine

## 2015-07-04 ENCOUNTER — Ambulatory Visit (INDEPENDENT_AMBULATORY_CARE_PROVIDER_SITE_OTHER): Payer: Medicare Other | Admitting: Internal Medicine

## 2015-07-04 VITALS — BP 132/88 | HR 60 | Temp 98.3°F | Resp 16 | Ht 71.0 in | Wt 235.0 lb

## 2015-07-04 DIAGNOSIS — E785 Hyperlipidemia, unspecified: Secondary | ICD-10-CM

## 2015-07-04 DIAGNOSIS — N183 Chronic kidney disease, stage 3 unspecified: Secondary | ICD-10-CM

## 2015-07-04 DIAGNOSIS — E118 Type 2 diabetes mellitus with unspecified complications: Secondary | ICD-10-CM | POA: Diagnosis not present

## 2015-07-04 DIAGNOSIS — I10 Essential (primary) hypertension: Secondary | ICD-10-CM

## 2015-07-04 LAB — LIPID PANEL
CHOL/HDL RATIO: 4
Cholesterol: 166 mg/dL (ref 0–200)
HDL: 46.4 mg/dL (ref >39.00)
LDL Cholesterol: 95 mg/dL (ref 0–99)
NonHDL: 119.12
TRIGLYCERIDES: 123 mg/dL (ref 0.0–149.0)
VLDL: 24.6 mg/dL (ref 0.0–40.0)

## 2015-07-04 LAB — HEMOGLOBIN A1C: Hgb A1c MFr Bld: 6.7 % — ABNORMAL HIGH (ref 4.6–6.5)

## 2015-07-04 LAB — BASIC METABOLIC PANEL
BUN: 20 mg/dL (ref 6–23)
CHLORIDE: 102 meq/L (ref 96–112)
CO2: 27 mEq/L (ref 19–32)
Calcium: 9.5 mg/dL (ref 8.4–10.5)
Creatinine, Ser: 1.36 mg/dL (ref 0.40–1.50)
GFR: 54.1 mL/min — ABNORMAL LOW (ref >60.00)
Glucose, Bld: 130 mg/dL — ABNORMAL HIGH (ref 70–99)
POTASSIUM: 4.1 meq/L (ref 3.5–5.1)
SODIUM: 140 meq/L (ref 135–145)

## 2015-07-04 NOTE — Progress Notes (Signed)
Subjective:  Patient ID: Ryan Duke, male    DOB: 07/27/1941  Age: 75 y.o. MRN: [id number]  CC: Diabetes   HPI Ryan Duke presents for follow-up on hypertension, diabetes and hypertension. Since I last saw him his adopted son has died from complications of penile cancer with a pulmonary embolus. He is is grieving but is not showing any signs or symptoms of depression. He is losing weight with diet and exercise and offers no other complaints today.  Outpatient Prescriptions Prior to Visit  Medication Sig Dispense Refill  . aspirin 81 MG chewable tablet Chew 81 mg by mouth daily.      Marland Kitchen atenolol-chlorthalidone (TENORETIC) 100-25 MG per tablet TAKE 1 TABLET ONCE DAILY. 30 tablet 11  . chlorthalidone (HYGROTON) 25 MG tablet Take 1 tablet (25 mg total) by mouth daily. 90 tablet 3  . Cholecalciferol 1000 UNITS tablet Take 1,000 Units by mouth daily.      Marland Kitchen glucose blood (BAYER CONTOUR TEST) test strip 1 each by Other route 2 (two) times daily. Use as instructed     . Glucose Blood (BLOOD GLUCOSE TEST STRIPS) STRP Test twice daily as directed Dx:250.02 50 each prn  . Insulin Glargine (TOUJEO SOLOSTAR) 300 UNIT/ML SOPN Inject 30 Units into the skin daily. 3 pen 0  . Lancets MISC Test blood sugar twice daily as directed. Dx:250.02 100 each prn  . losartan (COZAAR) 100 MG tablet TAKE 1 TABLET DAILY FOR HYPERTENSION. 90 tablet 3  . pravastatin (PRAVACHOL) 40 MG tablet Take 1 tablet (40 mg total) by mouth daily. 90 tablet 3  . sitaGLIPtin (JANUVIA) 100 MG tablet Take 1 tablet (100 mg total) by mouth daily. 90 tablet 3  . metoprolol succinate (TOPROL-XL) 100 MG 24 hr tablet Take 1 tablet (100 mg total) by mouth daily. Take with or immediately following a meal. 90 tablet 3  . vardenafil (LEVITRA) 20 MG tablet Take 1 tablet (20 mg total) by mouth daily as needed for erectile dysfunction. (Patient not taking: Reported on 07/04/2015) 24 tablet 0   No facility-administered medications prior to  visit.    ROS Review of Systems  Constitutional: Negative.  Negative for fever, chills, diaphoresis, appetite change, fatigue and unexpected weight change.  HENT: Negative.   Eyes: Negative.   Respiratory: Negative.  Negative for cough, choking, chest tightness, shortness of breath and stridor.   Cardiovascular: Negative.  Negative for chest pain, palpitations and leg swelling.  Gastrointestinal: Negative.  Negative for nausea, vomiting, abdominal pain, diarrhea, constipation and blood in stool.  Endocrine: Negative.  Negative for polydipsia, polyphagia and polyuria.  Genitourinary: Negative.   Musculoskeletal: Negative.   Skin: Negative.  Negative for rash.  Allergic/Immunologic: Negative.   Neurological: Negative.   Hematological: Negative.  Negative for adenopathy. Does not bruise/bleed easily.  Psychiatric/Behavioral: Positive for dysphoric mood. Negative for suicidal ideas, hallucinations, behavioral problems, confusion, sleep disturbance, self-injury, decreased concentration and agitation. The patient is not nervous/anxious and is not hyperactive.     Objective:  BP 132/88 mmHg  Pulse 60  Temp(Src) 98.3 F (36.8 C) (Oral)  Resp 16  Ht 5\' 11"  (1.803 m)  Wt 235 lb (106.595 kg)  BMI 32.79 kg/m2  SpO2 99%  BP Readings from Last 3 Encounters:  07/04/15 132/88  03/04/15 148/96  10/22/14 130/80    Wt Readings from Last 3 Encounters:  07/04/15 235 lb (106.595 kg)  03/04/15 239 lb (108.41 kg)  10/22/14 232 lb (105.235 kg)    Physical Exam  Constitutional: He is oriented to person, place, and time. No distress.  HENT:  Mouth/Throat: Oropharynx is clear and moist. No oropharyngeal exudate.  Eyes: Conjunctivae are normal. Right eye exhibits no discharge. Left eye exhibits no discharge. No scleral icterus.  Neck: Normal range of motion. Neck supple. No JVD present. No tracheal deviation present. No thyromegaly present.  Cardiovascular: S1 normal and S2 normal.  Exam reveals  no gallop and no friction rub.   Murmur heard.  Systolic murmur is present with a grade of 1/6   No diastolic murmur is present  Pulmonary/Chest: Effort normal and breath sounds normal. No stridor. No respiratory distress. He has no wheezes. He has no rales. He exhibits no tenderness.  Abdominal: Soft. Bowel sounds are normal. He exhibits no distension and no mass. There is no tenderness. There is no rebound and no guarding.  Musculoskeletal: Normal range of motion. He exhibits no edema or tenderness.  Lymphadenopathy:    He has no cervical adenopathy.  Neurological: He is oriented to person, place, and time.  Skin: Skin is warm and dry. No rash noted. He is not diaphoretic. No erythema. No pallor.  Psychiatric: His speech is normal and behavior is normal. Judgment and thought content normal. His mood appears not anxious. Cognition and memory are normal. He does not exhibit a depressed mood. He expresses no homicidal and no suicidal ideation. He expresses no suicidal plans and no homicidal plans.  He is tearful today    Lab Results  Component Value Date   WBC 8.7 10/18/2013   HGB 16.0 10/18/2013   HCT 47.5 10/18/2013   PLT 271.0 10/18/2013   GLUCOSE 130* 07/04/2015   CHOL 166 07/04/2015   TRIG 123.0 07/04/2015   HDL 46.40 07/04/2015   LDLDIRECT 100.0 06/13/2009   LDLCALC 95 07/04/2015   ALT 21 07/17/2014   AST 20 07/17/2014   NA 140 07/04/2015   K 4.1 07/04/2015   CL 102 07/04/2015   CREATININE 1.36 07/04/2015   BUN 20 07/04/2015   CO2 27 07/04/2015   TSH 2.82 10/18/2013   PSA 0.68 10/22/2014   HGBA1C 6.7* 07/04/2015   MICROALBUR 19.8* 03/04/2015    Mr Brain Wo Contrast  06/17/2013   *RADIOLOGY REPORT*  Clinical Data: 74 year old male with left eyelid drooping x1 week. Facial palsy.  MRI HEAD WITHOUT CONTRAST  Technique:  Multiplanar, multiecho pulse sequences of the brain and surrounding structures were obtained according to standard protocol without intravenous contrast.   Comparison: Head CT without contrast 02/20/2009.  Findings: Cerebral volume is within normal limits for age.  No restricted diffusion to suggest acute infarction.  No midline shift, mass effect, evidence of mass lesion, ventriculomegaly, extra-axial collection or acute intracranial hemorrhage. Cervicomedullary junction and pituitary are within normal limits. Negative visualized cervical spine. Major intracranial vascular flow voids are preserved.  No cortical encephalomalacia identified. Numerous small foci of T2 and FLAIR hyperintensity scattered in the cerebral white matter.  Pattern is nonspecific.  Deep gray matter nuclei, brainstem, and cerebellum are within normal limits for age. Grossly normal visualized internal auditory structures.  Negative noncontrast appearance of the cavernous sinus.  No suprasellar mass or mass effect.  Normal bone marrow signal.  Minimal mucosal thickening ethmoid and sphenoid sinuses. Visualized orbit soft tissues are within normal limits.  Negative scalp soft tissues.  Normal noncontrast appearance of the left facial nerve at the stylomastoid foramen. Left parotid gland appears normal.  IMPRESSION: 1. No acute intracranial abnormality.  No explanation for left facial  weakness identified. If symptoms worsen or remain unexplained, consider dedicated face or IAC MRI. 2.  Mild to moderate for age nonspecific cerebral white matter signal changes, most commonly due to chronic small vessel disease.   Original Report Authenticated By: Roselyn Reef, M.D.    Assessment & Plan:   Jemuel was seen today for diabetes.  Diagnoses and all orders for this visit:  Essential hypertension- his blood pressure is adequately well controlled, his electrolytes and renal function are stable. Orders: -     Basic metabolic panel; Future  Type II diabetes mellitus with manifestations- his blood sugars are well-controlled. Orders: -     Basic metabolic panel; Future -     Hemoglobin A1c;  Future  Hyperlipidemia with target LDL less than 100- he has achieved his LDL goal is doing well on the statin. Orders: -     Lipid panel; Future  Kidney disease, chronic, 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)- his renal function is stable, will continue to maintain good control of his blood pressure and his blood sugars, he will avoid nephrotoxic agents. Orders: -     Basic metabolic panel; Future  I have discontinued Mr. Fowers's vardenafil and metoprolol succinate. I am also having him maintain his aspirin, Cholecalciferol, glucose blood, Lancets, BLOOD GLUCOSE TEST STRIPS, sitaGLIPtin, losartan, pravastatin, chlorthalidone, Insulin Glargine, and atenolol-chlorthalidone.  No orders of the defined types were placed in this encounter.     Follow-up: Return in about 4 months (around 11/03/2015).  [name], MD

## 2015-07-04 NOTE — Progress Notes (Signed)
Pre visit review using our clinic review tool, if applicable. No additional management support is needed unless otherwise documented below in the visit note. 

## 2015-07-04 NOTE — Patient Instructions (Signed)

## 2015-07-26 ENCOUNTER — Other Ambulatory Visit: Payer: Self-pay | Admitting: Internal Medicine

## 2015-08-07 LAB — HM DIABETES EYE EXAM

## 2015-08-19 ENCOUNTER — Encounter: Payer: Self-pay | Admitting: Internal Medicine

## 2015-09-14 ENCOUNTER — Encounter: Payer: Self-pay | Admitting: Internal Medicine

## 2015-09-15 ENCOUNTER — Other Ambulatory Visit: Payer: Self-pay | Admitting: Internal Medicine

## 2015-09-15 DIAGNOSIS — F329 Major depressive disorder, single episode, unspecified: Secondary | ICD-10-CM | POA: Insufficient documentation

## 2015-09-15 DIAGNOSIS — F32A Depression, unspecified: Secondary | ICD-10-CM

## 2015-09-15 MED ORDER — VORTIOXETINE HBR 5 MG PO TABS: 1.0000 | ORAL_TABLET | Freq: Every day | ORAL | Status: DC

## 2015-09-16 ENCOUNTER — Encounter: Payer: Self-pay | Admitting: Internal Medicine

## 2015-09-17 ENCOUNTER — Other Ambulatory Visit: Payer: Self-pay | Admitting: Internal Medicine

## 2015-09-17 DIAGNOSIS — F329 Major depressive disorder, single episode, unspecified: Secondary | ICD-10-CM

## 2015-09-17 DIAGNOSIS — F32A Depression, unspecified: Secondary | ICD-10-CM

## 2015-09-17 MED ORDER — SERTRALINE HCL 50 MG PO TABS: 50.0000 mg | ORAL_TABLET | Freq: Every day | ORAL | Status: DC

## 2015-10-21 ENCOUNTER — Encounter: Payer: Self-pay | Admitting: Internal Medicine

## 2015-10-28 ENCOUNTER — Encounter: Payer: Self-pay | Admitting: Internal Medicine

## 2015-10-28 ENCOUNTER — Ambulatory Visit (INDEPENDENT_AMBULATORY_CARE_PROVIDER_SITE_OTHER): Payer: Medicare Other | Admitting: Internal Medicine

## 2015-10-28 ENCOUNTER — Other Ambulatory Visit (INDEPENDENT_AMBULATORY_CARE_PROVIDER_SITE_OTHER): Payer: Medicare Other

## 2015-10-28 VITALS — BP 126/80 | HR 71 | Temp 98.1°F | Resp 16 | Ht 71.0 in | Wt 245.0 lb

## 2015-10-28 DIAGNOSIS — E118 Type 2 diabetes mellitus with unspecified complications: Secondary | ICD-10-CM

## 2015-10-28 DIAGNOSIS — N400 Enlarged prostate without lower urinary tract symptoms: Secondary | ICD-10-CM

## 2015-10-28 DIAGNOSIS — N183 Chronic kidney disease, stage 3 unspecified: Secondary | ICD-10-CM

## 2015-10-28 DIAGNOSIS — Z23 Encounter for immunization: Secondary | ICD-10-CM

## 2015-10-28 DIAGNOSIS — Z0000 Encounter for general adult medical examination without abnormal findings: Secondary | ICD-10-CM

## 2015-10-28 DIAGNOSIS — I10 Essential (primary) hypertension: Secondary | ICD-10-CM | POA: Diagnosis not present

## 2015-10-28 LAB — CBC WITH DIFFERENTIAL/PLATELET
BASOS PCT: 0.7 % (ref 0.0–3.0)
Basophils Absolute: 0.1 10*3/uL (ref 0.0–0.1)
EOS ABS: 0.3 10*3/uL (ref 0.0–0.7)
EOS PCT: 3.1 % (ref 0.0–5.0)
HCT: 48 % (ref 39.0–52.0)
HEMOGLOBIN: 16.3 g/dL (ref 13.0–17.0)
LYMPHS ABS: 2 10*3/uL (ref 0.7–4.0)
Lymphocytes Relative: 22.5 % (ref 12.0–46.0)
MCHC: 33.9 g/dL (ref 30.0–36.0)
MCV: 93.4 fl (ref 78.0–100.0)
MONO ABS: 0.8 10*3/uL (ref 0.1–1.0)
Monocytes Relative: 8.6 % (ref 3.0–12.0)
NEUTROS ABS: 5.7 10*3/uL (ref 1.4–7.7)
NEUTROS PCT: 65.1 % (ref 43.0–77.0)
PLATELETS: 261 10*3/uL (ref 150.0–400.0)
RBC: 5.14 Mil/uL (ref 4.22–5.81)
RDW: 12.9 % (ref 11.5–15.5)
WBC: 8.8 10*3/uL (ref 4.0–10.5)

## 2015-10-28 LAB — BASIC METABOLIC PANEL
BUN: 20 mg/dL (ref 6–23)
CHLORIDE: 101 meq/L (ref 96–112)
CO2: 30 mEq/L (ref 19–32)
CREATININE: 1.43 mg/dL (ref 0.40–1.50)
Calcium: 9.7 mg/dL (ref 8.4–10.5)
GFR: 51.01 mL/min — ABNORMAL LOW (ref >60.00)
GLUCOSE: 148 mg/dL — AB (ref 70–99)
POTASSIUM: 4.5 meq/L (ref 3.5–5.1)
Sodium: 140 mEq/L (ref 135–145)

## 2015-10-28 LAB — PSA: PSA: 1.05 ng/mL (ref 0.10–4.00)

## 2015-10-28 LAB — FECAL OCCULT BLOOD, GUAIAC: Fecal Occult Blood: NEGATIVE

## 2015-10-28 LAB — HEMOGLOBIN A1C: Hgb A1c MFr Bld: 7.1 % — ABNORMAL HIGH (ref 4.6–6.5)

## 2015-10-28 NOTE — Assessment & Plan Note (Signed)

## 2015-10-28 NOTE — Progress Notes (Signed)
Subjective:  Patient ID: Ryan Duke, male    DOB: [birth year]  Age: 74 y.o. MRN: [id number]  CC: Hypertension; Diabetes; and Annual Exam   HPI Ryan Duke presents for for complete physical but he also complains of weight gain.  Outpatient Prescriptions Prior to Visit  Medication Sig Dispense Refill  . aspirin 81 MG chewable tablet Chew 81 mg by mouth daily.      Marland Kitchen atenolol-chlorthalidone (TENORETIC) 100-25 MG per tablet TAKE 1 TABLET ONCE DAILY. 30 tablet 11  . Cholecalciferol 1000 UNITS tablet Take 1,000 Units by mouth daily.      Marland Kitchen glucose blood (BAYER CONTOUR TEST) test strip 1 each by Other route 2 (two) times daily. Use as instructed     . Glucose Blood (BLOOD GLUCOSE TEST STRIPS) STRP Test twice daily as directed Dx:250.02 50 each prn  . Insulin Glargine (TOUJEO SOLOSTAR) 300 UNIT/ML SOPN Inject 30 Units into the skin daily. 3 pen 0  . Lancets MISC Test blood sugar twice daily as directed. Dx:250.02 100 each prn  . losartan (COZAAR) 100 MG tablet TAKE 1 TABLET DAILY FOR HYPERTENSION. 90 tablet 1  . pravastatin (PRAVACHOL) 40 MG tablet Take 1 tablet (40 mg total) by mouth daily. 90 tablet 3  . sitaGLIPtin (JANUVIA) 100 MG tablet Take 1 tablet (100 mg total) by mouth daily. 90 tablet 3  . sertraline (ZOLOFT) 50 MG tablet Take 1 tablet (50 mg total) by mouth daily. 30 tablet 1  . chlorthalidone (HYGROTON) 25 MG tablet Take 1 tablet (25 mg total) by mouth daily. (Patient not taking: Reported on 10/28/2015) 90 tablet 3   No facility-administered medications prior to visit.    ROS Review of Systems  Constitutional: Positive for unexpected weight change. Negative for fever, chills, diaphoresis, appetite change and fatigue.  HENT: Negative.   Eyes: Negative.   Respiratory: Negative.  Negative for cough, choking, chest tightness, shortness of breath and stridor.   Cardiovascular: Negative.  Negative for chest pain, palpitations and leg swelling.  Gastrointestinal:  Negative.  Negative for nausea, vomiting, abdominal pain, diarrhea, constipation and blood in stool.  Endocrine: Negative.  Negative for polydipsia, polyphagia and polyuria.  Genitourinary: Negative.   Musculoskeletal: Negative.  Negative for myalgias and back pain.  Skin: Negative.   Allergic/Immunologic: Negative.   Neurological: Negative.  Negative for dizziness, tremors, weakness, light-headedness, numbness and headaches.  Hematological: Negative.  Negative for adenopathy. Does not bruise/bleed easily.  Psychiatric/Behavioral: Negative.  Negative for suicidal ideas, confusion, sleep disturbance, dysphoric mood and decreased concentration. The patient is not nervous/anxious.     Objective:  BP 126/80 mmHg  Pulse 71  Temp(Src) 98.1 F (36.7 C) (Oral)  Resp 16  Ht 5\' 11"  (1.803 m)  Wt 245 lb (111.131 kg)  BMI 34.19 kg/m2  SpO2 98%  BP Readings from Last 3 Encounters:  10/28/15 126/80  07/04/15 132/88  03/04/15 148/96    Wt Readings from Last 3 Encounters:  10/28/15 245 lb (111.131 kg)  07/04/15 235 lb (106.595 kg)  03/04/15 239 lb (108.41 kg)    Physical Exam  Constitutional: He is oriented to person, place, and time. He appears well-developed and well-nourished. No distress.  HENT:  Head: Normocephalic and atraumatic.  Mouth/Throat: Oropharynx is clear and moist. No oropharyngeal exudate.  Eyes: Conjunctivae are normal. Right eye exhibits no discharge. Left eye exhibits no discharge. No scleral icterus.  Neck: Normal range of motion. Neck supple. No JVD present. No tracheal deviation present. No thyromegaly present.  Cardiovascular: Normal rate, S1 normal, S2 normal and intact distal pulses.  Exam reveals no gallop, no S3, no S4 and no friction rub.   Murmur heard.  Systolic murmur is present with a grade of 2/6   No diastolic murmur is present  Pulmonary/Chest: Effort normal and breath sounds normal. No stridor. No respiratory distress. He has no wheezes. He has no  rales. He exhibits no tenderness.  Abdominal: Soft. Bowel sounds are normal. He exhibits no distension and no mass. There is no tenderness. There is no rebound and no guarding. Hernia confirmed negative in the right inguinal area and confirmed negative in the left inguinal area.  Genitourinary: Rectum normal, testes normal and penis normal. Rectal exam shows no external hemorrhoid, no internal hemorrhoid, no fissure, no mass, no tenderness and anal tone normal. Guaiac negative stool. Prostate is enlarged (2+ smooth symm BPH). Prostate is not tender. Right testis shows no mass, no swelling and no tenderness. Right testis is descended. Left testis shows no swelling and no tenderness. Left testis is descended. No penile erythema or penile tenderness. No discharge found.  Musculoskeletal: Normal range of motion. He exhibits no edema or tenderness.  Lymphadenopathy:    He has no cervical adenopathy.       Right: No inguinal adenopathy present.       Left: No inguinal adenopathy present.  Neurological: He is oriented to person, place, and time.  Skin: Skin is warm and dry. No rash noted. He is not diaphoretic. No erythema. No pallor.  Psychiatric: He has a normal mood and affect. His behavior is normal. Judgment and thought content normal.  Vitals reviewed.   Lab Results  Component Value Date   WBC 8.8 10/28/2015   HGB 16.3 10/28/2015   HCT 48.0 10/28/2015   PLT 261.0 10/28/2015   GLUCOSE 148* 10/28/2015   CHOL 166 07/04/2015   TRIG 123.0 07/04/2015   HDL 46.40 07/04/2015   LDLDIRECT 100.0 06/13/2009   LDLCALC 95 07/04/2015   ALT 21 07/17/2014   AST 20 07/17/2014   NA 140 10/28/2015   K 4.5 10/28/2015   CL 101 10/28/2015   CREATININE 1.43 10/28/2015   BUN 20 10/28/2015   CO2 30 10/28/2015   TSH 2.82 10/18/2013   PSA 1.05 10/28/2015   HGBA1C 7.1* 10/28/2015   MICROALBUR 19.8* 03/04/2015    Mr Brain Wo Contrast  06/17/2013  *RADIOLOGY REPORT* Clinical Data: 74 year old male with left  eyelid drooping x1 week. Facial palsy. MRI HEAD WITHOUT CONTRAST Technique:  Multiplanar, multiecho pulse sequences of the brain and surrounding structures were obtained according to standard protocol without intravenous contrast. Comparison: Head CT without contrast 02/20/2009. Findings: Cerebral volume is within normal limits for age.  No restricted diffusion to suggest acute infarction.  No midline shift, mass effect, evidence of mass lesion, ventriculomegaly, extra-axial collection or acute intracranial hemorrhage. Cervicomedullary junction and pituitary are within normal limits. Negative visualized cervical spine. Major intracranial vascular flow voids are preserved.  No cortical encephalomalacia identified. Numerous small foci of T2 and FLAIR hyperintensity scattered in the cerebral white matter.  Pattern is nonspecific.  Deep gray matter nuclei, brainstem, and cerebellum are within normal limits for age. Grossly normal visualized internal auditory structures.  Negative noncontrast appearance of the cavernous sinus.  No suprasellar mass or mass effect. Normal bone marrow signal.  Minimal mucosal thickening ethmoid and sphenoid sinuses. Visualized orbit soft tissues are within normal limits.  Negative scalp soft tissues.  Normal noncontrast appearance of the left facial  nerve at the stylomastoid foramen. Left parotid gland appears normal. IMPRESSION: 1. No acute intracranial abnormality.  No explanation for left facial weakness identified. If symptoms worsen or remain unexplained, consider dedicated face or IAC MRI. 2.  Mild to moderate for age nonspecific cerebral white matter signal changes, most commonly due to chronic small vessel disease. Original Report Authenticated By: Roselyn Reef, M.D.    Assessment & Plan:   Mung was seen today for hypertension, diabetes and annual exam.  Diagnoses and all orders for this visit:  Essential hypertension- his blood pressure is well-controlled, electrolytes  and renal function are stable. -     Basic metabolic panel; Future -     CBC with Differential/Platelet; Future  Type 2 diabetes mellitus with complication, without long-term current use of insulin ([place])- his A1c is up to 7.1%. He has gained 10 pounds. He does not need any additional medications at this time but he agrees to work on his lifestyle modifications. -     Basic metabolic panel; Future -     Hemoglobin A1c; Future  Kidney disease, chronic, 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)- his renal function is stable. -     Basic metabolic panel; Future -     CBC with Differential/Platelet; Future  BPH (benign prostatic hyperplasia)- his PSA remains low so I'm not concerned about prostate cancer. He has no symptoms that need to be treated. -     PSA; Future  Need for influenza vaccination -     Flu vaccine HIGH DOSE PF  I have discontinued Mr. Deloach's chlorthalidone and sertraline. I am also having him maintain his aspirin, Cholecalciferol, glucose blood, Lancets, BLOOD GLUCOSE TEST STRIPS, sitaGLIPtin, pravastatin, Insulin Glargine, atenolol-chlorthalidone, and losartan.  No orders of the defined types were placed in this encounter.     Follow-up: Return in about 6 months (around 04/26/2016).  [name], MD

## 2015-10-28 NOTE — Patient Instructions (Signed)

## 2015-10-28 NOTE — Progress Notes (Signed)
Pre visit review using our clinic review tool, if applicable. No additional management support is needed unless otherwise documented below in the visit note. 

## 2015-10-30 ENCOUNTER — Other Ambulatory Visit: Payer: Self-pay | Admitting: Internal Medicine

## 2015-12-10 ENCOUNTER — Encounter: Payer: Self-pay | Admitting: Internal Medicine

## 2015-12-18 ENCOUNTER — Encounter: Payer: Self-pay | Admitting: Internal Medicine

## 2016-01-21 ENCOUNTER — Encounter: Payer: Self-pay | Admitting: Internal Medicine

## 2016-02-20 ENCOUNTER — Other Ambulatory Visit: Payer: Self-pay | Admitting: Internal Medicine

## 2016-02-20 ENCOUNTER — Encounter: Payer: Self-pay | Admitting: Internal Medicine

## 2016-02-28 ENCOUNTER — Telehealth: Payer: Self-pay

## 2016-02-28 NOTE — Telephone Encounter (Signed)
PA initiated and Approved via CoverMyMeds through 02/27/2017 [facility]

## 2016-03-13 ENCOUNTER — Encounter: Payer: Self-pay | Admitting: Internal Medicine

## 2016-04-21 ENCOUNTER — Encounter: Payer: Self-pay | Admitting: Internal Medicine

## 2016-04-21 NOTE — Telephone Encounter (Signed)
Pt 6 mo f/u due 5/2. I have rescheduled his apt for 05/06/16.

## 2016-05-02 ENCOUNTER — Other Ambulatory Visit: Payer: Self-pay | Admitting: Internal Medicine

## 2016-05-06 ENCOUNTER — Encounter: Payer: Self-pay | Admitting: Internal Medicine

## 2016-05-06 ENCOUNTER — Ambulatory Visit (INDEPENDENT_AMBULATORY_CARE_PROVIDER_SITE_OTHER): Payer: Medicare Other | Admitting: Internal Medicine

## 2016-05-06 ENCOUNTER — Other Ambulatory Visit (INDEPENDENT_AMBULATORY_CARE_PROVIDER_SITE_OTHER): Payer: Medicare Other

## 2016-05-06 VITALS — BP 130/78 | HR 60 | Temp 98.6°F | Resp 16 | Ht 71.0 in | Wt 240.0 lb

## 2016-05-06 DIAGNOSIS — N183 Chronic kidney disease, stage 3 unspecified: Secondary | ICD-10-CM

## 2016-05-06 DIAGNOSIS — E785 Hyperlipidemia, unspecified: Secondary | ICD-10-CM | POA: Diagnosis not present

## 2016-05-06 DIAGNOSIS — Z794 Long term (current) use of insulin: Secondary | ICD-10-CM

## 2016-05-06 DIAGNOSIS — I10 Essential (primary) hypertension: Secondary | ICD-10-CM | POA: Diagnosis not present

## 2016-05-06 DIAGNOSIS — E118 Type 2 diabetes mellitus with unspecified complications: Secondary | ICD-10-CM

## 2016-05-06 DIAGNOSIS — C44209 Unspecified malignant neoplasm of skin of left ear and external auricular canal: Secondary | ICD-10-CM

## 2016-05-06 LAB — MICROALBUMIN / CREATININE URINE RATIO
Creatinine,U: 194.8 mg/dL
MICROALB UR: 29.4 mg/dL — AB (ref 0.0–1.9)
Microalb Creat Ratio: 15.1 mg/g (ref 0.0–30.0)

## 2016-05-06 LAB — LIPID PANEL
CHOL/HDL RATIO: 3
Cholesterol: 181 mg/dL (ref 0–200)
HDL: 52.6 mg/dL (ref >39.00)
LDL Cholesterol: 106 mg/dL — ABNORMAL HIGH (ref 0–99)
NonHDL: 128.05
TRIGLYCERIDES: 108 mg/dL (ref 0.0–149.0)
VLDL: 21.6 mg/dL (ref 0.0–40.0)

## 2016-05-06 LAB — URINALYSIS, ROUTINE W REFLEX MICROSCOPIC
BILIRUBIN URINE: NEGATIVE
Hgb urine dipstick: NEGATIVE
KETONES UR: NEGATIVE
Leukocytes, UA: NEGATIVE
NITRITE: NEGATIVE
RBC / HPF: NONE SEEN (ref 0–?)
SPECIFIC GRAVITY, URINE: 1.02 (ref 1.000–1.030)
Total Protein, Urine: 30 — AB
UROBILINOGEN UA: 0.2 (ref 0.0–1.0)
Urine Glucose: NEGATIVE
pH: 7 (ref 5.0–8.0)

## 2016-05-06 LAB — BASIC METABOLIC PANEL
BUN: 19 mg/dL (ref 6–23)
CHLORIDE: 103 meq/L (ref 96–112)
CO2: 31 mEq/L (ref 19–32)
CREATININE: 1.44 mg/dL (ref 0.40–1.50)
Calcium: 10 mg/dL (ref 8.4–10.5)
GFR: 50.53 mL/min — ABNORMAL LOW (ref >60.00)
Glucose, Bld: 131 mg/dL — ABNORMAL HIGH (ref 70–99)
Potassium: 4.6 mEq/L (ref 3.5–5.1)
Sodium: 141 mEq/L (ref 135–145)

## 2016-05-06 LAB — HEMOGLOBIN A1C: Hgb A1c MFr Bld: 6.9 % — ABNORMAL HIGH (ref 4.6–6.5)

## 2016-05-06 NOTE — Progress Notes (Signed)
Subjective:  Patient ID: Ryan Duke, male    DOB: 1940-12-29  Age: 75 y.o. MRN: [place]:[id number]  CC: Hypertension; Hyperlipidemia; and Diabetes   HPI Ryan Duke presents for a follow-up on the above-mentioned medical problems. He is happy to report that since I last saw him he has lost 5 pounds with diet and exercise. He tells me his blood sugars up and well controlled and he has had no episodes of visual disturbance, polyuria/polydipsia/polyphagia.  His blood pressure hass been well controlled with losartan, atenolol, and chlorthalidone. He's had no recent episodes of chest pain, palpitations, dyspnea on exertion, edema, or fatigue.  He is concerned about a lesion on the top of his left ear, it is been there for several months, it gets flaky and irritated and he scratches it and it bleeds. He feels like it's getting larger and spreading.  Outpatient Prescriptions Prior to Visit  Medication Sig Dispense Refill  . aspirin 81 MG chewable tablet Chew 81 mg by mouth daily.      Marland Kitchen atenolol-chlorthalidone (TENORETIC) 100-25 MG tablet TAKE 1 TABLET ONCE DAILY. 30 tablet 0  . Cholecalciferol 1000 UNITS tablet Take 1,000 Units by mouth daily.      Marland Kitchen glucose blood (BAYER CONTOUR TEST) test strip 1 each by Other route 2 (two) times daily. Use as instructed     . Glucose Blood (BLOOD GLUCOSE TEST STRIPS) STRP Test twice daily as directed Dx:250.02 50 each prn  . Insulin Glargine (TOUJEO SOLOSTAR) 300 UNIT/ML SOPN Inject 30 Units into the skin daily. 3 pen 0  . Lancets MISC Test blood sugar twice daily as directed. Dx:250.02 100 each prn  . losartan (COZAAR) 100 MG tablet TAKE 1 TABLET DAILY FOR HYPERTENSION. 90 tablet 2  . pravastatin (PRAVACHOL) 40 MG tablet TAKE 1 TABLET ONCE DAILY. 30 tablet 11  . sitaGLIPtin (JANUVIA) 100 MG tablet Take 1 tablet (100 mg total) by mouth daily. 90 tablet 3   No facility-administered medications prior to visit.    ROS Review of Systems  Constitutional:  Negative.  Negative for chills, diaphoresis, activity change, appetite change, fatigue and unexpected weight change.  HENT: Negative.  Negative for facial swelling, sinus pressure, sore throat and trouble swallowing.   Eyes: Negative.  Negative for visual disturbance.  Respiratory: Negative.  Negative for cough, choking, chest tightness, shortness of breath and stridor.   Cardiovascular: Negative.  Negative for chest pain, palpitations and leg swelling.  Gastrointestinal: Negative.  Negative for nausea, vomiting, abdominal pain, diarrhea and constipation.  Endocrine: Negative.  Negative for polydipsia, polyphagia and polyuria.  Genitourinary: Negative.  Negative for dysuria, urgency, difficulty urinating and testicular pain.  Musculoskeletal: Negative.  Negative for myalgias, back pain and neck pain.  Skin: Negative.   Allergic/Immunologic: Negative.   Neurological: Negative.  Negative for dizziness, weakness and numbness.  Hematological: Negative.  Negative for adenopathy. Does not bruise/bleed easily.  Psychiatric/Behavioral: Negative.     Objective:  BP 130/78 mmHg  Pulse 60  Temp(Src) 98.6 F (37 C) (Oral)  Resp 16  Ht 5\' 11"  (1.803 m)  Wt 240 lb (108.863 kg)  BMI 33.49 kg/m2  SpO2 95%  BP Readings from Last 3 Encounters:  05/06/16 130/78  10/28/15 126/80  07/04/15 132/88    Wt Readings from Last 3 Encounters:  05/06/16 240 lb (108.863 kg)  10/28/15 245 lb (111.131 kg)  07/04/15 235 lb (106.595 kg)    Physical Exam  Constitutional: He is oriented to person, place, and time. No  distress.  HENT:  Mouth/Throat: Oropharynx is clear and moist. No oropharyngeal exudate.  Eyes: Conjunctivae are normal. Right eye exhibits no discharge. Left eye exhibits no discharge. No scleral icterus.  Neck: Normal range of motion. Neck supple. No JVD present. No tracheal deviation present. No thyromegaly present.  Cardiovascular: Normal rate, regular rhythm, S1 normal, S2 normal and intact  distal pulses.  Exam reveals no gallop and no friction rub.   Murmur heard.  Systolic murmur is present with a grade of 2/6   No diastolic murmur is present  Pulses:      Carotid pulses are 1+ on the right side, and 1+ on the left side.      Radial pulses are 1+ on the right side, and 1+ on the left side.       Femoral pulses are 1+ on the right side, and 1+ on the left side.      Popliteal pulses are 1+ on the right side, and 1+ on the left side.       Dorsalis pedis pulses are 1+ on the right side, and 1+ on the left side.       Posterior tibial pulses are 1+ on the right side, and 1+ on the left side.  Pulmonary/Chest: Effort normal and breath sounds normal. No stridor. No respiratory distress. He has no wheezes. He has no rales. He exhibits no tenderness.  Abdominal: Soft. Bowel sounds are normal. He exhibits no distension and no mass. There is no tenderness. There is no rebound and no guarding.  Musculoskeletal: Normal range of motion. He exhibits no edema or tenderness.  Lymphadenopathy:    He has no cervical adenopathy.  Neurological: He is oriented to person, place, and time.  Skin: Skin is warm and dry. No rash noted. He is not diaphoretic. No erythema. No pallor.  There is a 1.5 cm lesion on the top of the left helix. It is raised, fleshy and pale, with a central area of ulceration. This looks suspicious for basal cell carcinoma.  Vitals reviewed.   Lab Results  Component Value Date   WBC 8.8 10/28/2015   HGB 16.3 10/28/2015   HCT 48.0 10/28/2015   PLT 261.0 10/28/2015   GLUCOSE 131* 05/06/2016   CHOL 181 05/06/2016   TRIG 108.0 05/06/2016   HDL 52.60 05/06/2016   LDLDIRECT 100.0 06/13/2009   LDLCALC 106* 05/06/2016   ALT 21 07/17/2014   AST 20 07/17/2014   NA 141 05/06/2016   K 4.6 05/06/2016   CL 103 05/06/2016   CREATININE 1.44 05/06/2016   BUN 19 05/06/2016   CO2 31 05/06/2016   TSH 2.82 10/18/2013   PSA 1.05 10/28/2015   HGBA1C 6.9* 05/06/2016   MICROALBUR  29.4* 05/06/2016    Mr Brain Wo Contrast  06/17/2013  *RADIOLOGY REPORT* Clinical Data: 75 year old male with left eyelid drooping x1 week. Facial palsy. MRI HEAD WITHOUT CONTRAST Technique:  Multiplanar, multiecho pulse sequences of the brain and surrounding structures were obtained according to standard protocol without intravenous contrast. Comparison: Head CT without contrast 02/20/2009. Findings: Cerebral volume is within normal limits for age.  No restricted diffusion to suggest acute infarction.  No midline shift, mass effect, evidence of mass lesion, ventriculomegaly, extra-axial collection or acute intracranial hemorrhage. Cervicomedullary junction and pituitary are within normal limits. Negative visualized cervical spine. Major intracranial vascular flow voids are preserved.  No cortical encephalomalacia identified. Numerous small foci of T2 and FLAIR hyperintensity scattered in the cerebral white matter.  Pattern is  nonspecific.  Deep gray matter nuclei, brainstem, and cerebellum are within normal limits for age. Grossly normal visualized internal auditory structures.  Negative noncontrast appearance of the cavernous sinus.  No suprasellar mass or mass effect. Normal bone marrow signal.  Minimal mucosal thickening ethmoid and sphenoid sinuses. Visualized orbit soft tissues are within normal limits.  Negative scalp soft tissues.  Normal noncontrast appearance of the left facial nerve at the stylomastoid foramen. Left parotid gland appears normal. IMPRESSION: 1. No acute intracranial abnormality.  No explanation for left facial weakness identified. If symptoms worsen or remain unexplained, consider dedicated face or IAC MRI. 2.  Mild to moderate for age nonspecific cerebral white matter signal changes, most commonly due to chronic small vessel disease. Original Report Authenticated By: Roselyn Reef, M.D.    Assessment & Plan:   Vander was seen today for hypertension, hyperlipidemia and  diabetes.  Diagnoses and all orders for this visit:  Essential hypertension- his blood pressure is well-controlled, electrolytes and renal function are stable. -     Basic metabolic panel; Future -     Urinalysis, Routine w reflex microscopic (not at [facility]); Future  Type 2 diabetes mellitus with complication, with long-term current use of insulin ([place])- 7 A1c is 6.9%, his blood sugars are well-controlled and his renal function remains normal, will continue the current meds for blood sugar control -     Basic metabolic panel; Future -     Microalbumin / creatinine urine ratio; Future -     Hemoglobin A1c; Future  Hyperlipidemia with target LDL less than 100- he has not quite achieved his LDL goal but is doing well on the statin, he will continue to work on his lifestyle modifications. -     Lipid panel; Future  Kidney disease, chronic, 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)- his renal function is stable, he agrees to avoid nephrotoxic agents, will continue to maintain good control of his blood pressure and his blood sugar. -     Basic metabolic panel; Future -     Urinalysis, Routine w reflex microscopic (not at [facility]); Future   I am having Mr. Ham maintain his aspirin, Cholecalciferol, glucose blood, Lancets, BLOOD GLUCOSE TEST STRIPS, sitaGLIPtin, Insulin Glargine, pravastatin, losartan, and atenolol-chlorthalidone.  No orders of the defined types were placed in this encounter.     Follow-up: Return in about 6 months (around 11/05/2016).  [name], MD

## 2016-05-06 NOTE — Patient Instructions (Signed)

## 2016-05-06 NOTE — Progress Notes (Signed)
Pre visit review using our clinic review tool, if applicable. No additional management support is needed unless otherwise documented below in the visit note. 

## 2016-05-10 NOTE — Assessment & Plan Note (Signed)
Dermatology referral to consider a biopsy of this area

## 2016-06-03 ENCOUNTER — Other Ambulatory Visit: Payer: Self-pay | Admitting: Internal Medicine

## 2016-06-04 DIAGNOSIS — C44219 Basal cell carcinoma of skin of left ear and external auricular canal: Secondary | ICD-10-CM | POA: Diagnosis not present

## 2016-06-05 LAB — HEMOGLOBIN A1C: Hemoglobin A1C: 5.8

## 2016-06-05 LAB — BASIC METABOLIC PANEL
BUN: 23 mg/dL — AB (ref 4–21)
CREATININE: 1.2 mg/dL (ref 0.6–1.3)
GLUCOSE: 101 mg/dL
Potassium: 4.7 mmol/L (ref 3.4–5.3)
Sodium: 144 mmol/L (ref 137–147)

## 2016-06-05 LAB — CBC AND DIFFERENTIAL
HEMATOCRIT: 45 % (ref 41–53)
Hemoglobin: 15.1 g/dL (ref 13.5–17.5)
PLATELETS: 329 10*3/uL (ref 150–399)
WBC: 7.9 10^3/mL

## 2016-06-05 LAB — LIPID PANEL
Cholesterol: 151 mg/dL (ref 0–200)
HDL: 52 mg/dL (ref 35–70)
LDL Cholesterol: 84 mg/dL
TRIGLYCERIDES: 74 mg/dL (ref 40–160)

## 2016-06-05 LAB — TSH: TSH: 2.28 u[IU]/mL (ref 0.41–5.90)

## 2016-06-05 LAB — HEPATIC FUNCTION PANEL: ALT: 87 U/L — AB (ref 10–40)

## 2016-06-09 ENCOUNTER — Encounter: Payer: Self-pay | Admitting: Internal Medicine

## 2016-06-10 ENCOUNTER — Encounter: Payer: Self-pay | Admitting: Internal Medicine

## 2016-06-10 MED ORDER — COLCHICINE 0.6 MG PO TABS: ORAL_TABLET | ORAL | Status: DC

## 2016-06-15 ENCOUNTER — Encounter: Payer: Self-pay | Admitting: Internal Medicine

## 2016-06-16 ENCOUNTER — Encounter: Payer: Self-pay | Admitting: Internal Medicine

## 2016-06-16 ENCOUNTER — Other Ambulatory Visit: Payer: Self-pay | Admitting: Internal Medicine

## 2016-06-17 ENCOUNTER — Ambulatory Visit: Payer: Medicare Other | Admitting: Internal Medicine

## 2016-06-29 ENCOUNTER — Other Ambulatory Visit: Payer: Self-pay | Admitting: Internal Medicine

## 2016-06-29 ENCOUNTER — Encounter: Payer: Self-pay | Admitting: Internal Medicine

## 2016-07-06 ENCOUNTER — Encounter: Payer: Self-pay | Admitting: Internal Medicine

## 2016-07-07 ENCOUNTER — Other Ambulatory Visit: Payer: Self-pay | Admitting: Internal Medicine

## 2016-07-07 DIAGNOSIS — M1000 Idiopathic gout, unspecified site: Secondary | ICD-10-CM | POA: Insufficient documentation

## 2016-07-07 MED ORDER — COLCHICINE 0.6 MG PO TABS: ORAL_TABLET | ORAL | 1 refills | Status: DC

## 2016-07-07 MED ORDER — METHYLPREDNISOLONE 4 MG PO TBPK: ORAL_TABLET | ORAL | 0 refills | Status: DC

## 2016-07-08 ENCOUNTER — Ambulatory Visit (INDEPENDENT_AMBULATORY_CARE_PROVIDER_SITE_OTHER): Payer: Medicare Other | Admitting: Internal Medicine

## 2016-07-08 ENCOUNTER — Encounter: Payer: Self-pay | Admitting: Internal Medicine

## 2016-07-08 VITALS — BP 148/80 | HR 60 | Temp 98.0°F | Resp 16 | Ht 71.0 in | Wt 236.0 lb

## 2016-07-08 DIAGNOSIS — M1000 Idiopathic gout, unspecified site: Secondary | ICD-10-CM

## 2016-07-08 DIAGNOSIS — I10 Essential (primary) hypertension: Secondary | ICD-10-CM

## 2016-07-08 DIAGNOSIS — E118 Type 2 diabetes mellitus with unspecified complications: Secondary | ICD-10-CM | POA: Diagnosis not present

## 2016-07-08 DIAGNOSIS — M109 Gout, unspecified: Secondary | ICD-10-CM

## 2016-07-08 LAB — POCT GLYCOSYLATED HEMOGLOBIN (HGB A1C): HEMOGLOBIN A1C: 6.6

## 2016-07-08 NOTE — Progress Notes (Signed)
Subjective:  Patient ID: Ryan Duke, male    DOB: 09/16/1941  Age: 75 y.o. MRN: [place]:[id number]  CC: Diabetes and Gout   HPI Ryan Duke presents for concerns about pain, redness, and swelling in his left foot for several weeks. He called the office initially and has taken a course of colchicine without much improvement. He called the office one day prior to this visit and a prescription for Medrol Dosepak was sent to his pharmacy. Since he started that 24 hours ago he has had a drastic decrease in the pain and swelling in his left foot. He denies any recent injury, fever, chills, night sweats, chest pain, shortness of breath, dyspnea on exertion, or edema.  Outpatient Medications Prior to Visit  Medication Sig Dispense Refill  . aspirin 81 MG chewable tablet Chew 81 mg by mouth daily.      Marland Kitchen atenolol-chlorthalidone (TENORETIC) 100-25 MG tablet TAKE 1 TABLET ONCE DAILY. 90 tablet 1  . Cholecalciferol 1000 UNITS tablet Take 1,000 Units by mouth daily.      . colchicine 0.6 MG tablet Take 1.2 mg once then 0.6 mg po x 1 one hour later.  Do not repeat for three days. 3 tablet 1  . glucose blood (BAYER CONTOUR TEST) test strip 1 each by Other route 2 (two) times daily. Use as instructed     . Glucose Blood (BLOOD GLUCOSE TEST STRIPS) STRP Test twice daily as directed Dx:250.02 50 each prn  . Insulin Glargine (TOUJEO SOLOSTAR) 300 UNIT/ML SOPN Inject 30 Units into the skin daily. 3 pen 0  . Lancets MISC Test blood sugar twice daily as directed. Dx:250.02 100 each prn  . losartan (COZAAR) 100 MG tablet TAKE 1 TABLET DAILY FOR HYPERTENSION. 90 tablet 2  . methylPREDNISolone (MEDROL DOSEPAK) 4 MG TBPK tablet TAKE AS DIRECTED 21 tablet 0  . pravastatin (PRAVACHOL) 40 MG tablet TAKE 1 TABLET ONCE DAILY. 30 tablet 11  . sitaGLIPtin (JANUVIA) 100 MG tablet Take 1 tablet (100 mg total) by mouth daily. 90 tablet 3   No facility-administered medications prior to visit.     ROS Review of Systems    Constitutional: Negative.  Negative for appetite change, chills, fatigue and fever.  HENT: Negative.  Negative for sore throat.   Eyes: Negative.  Negative for visual disturbance.  Respiratory: Negative.  Negative for cough, choking, chest tightness, shortness of breath and stridor.   Cardiovascular: Negative.  Negative for chest pain, palpitations and leg swelling.  Gastrointestinal: Negative.  Negative for abdominal pain, constipation, diarrhea, nausea and vomiting.  Endocrine: Negative.   Genitourinary: Negative.   Musculoskeletal: Positive for arthralgias. Negative for myalgias.  Skin: Positive for color change. Negative for pallor, rash and wound.  Allergic/Immunologic: Negative.   Neurological: Negative.  Negative for dizziness.  Hematological: Negative.  Negative for adenopathy. Does not bruise/bleed easily.  Psychiatric/Behavioral: Negative.     Objective:  BP (!) 148/80 (BP Location: Left Arm, Patient Position: Sitting, Cuff Size: Normal)   Pulse 60   Temp 98 F (36.7 C) (Oral)   Resp 16   Ht 5\' 11"  (1.803 m)   Wt 236 lb (107 kg)   SpO2 97%   BMI 32.92 kg/m   BP Readings from Last 3 Encounters:  07/08/16 (!) 148/80  05/06/16 130/78  10/28/15 126/80    Wt Readings from Last 3 Encounters:  07/08/16 236 lb (107 kg)  05/06/16 240 lb (108.9 kg)  10/28/15 245 lb (111.1 kg)    Physical  Exam  Constitutional: He is oriented to person, place, and time. No distress.  HENT:  Mouth/Throat: Oropharynx is clear and moist. No oropharyngeal exudate.  Eyes: Conjunctivae are normal. Right eye exhibits no discharge. Left eye exhibits no discharge. No scleral icterus.  Neck: Normal range of motion. Neck supple. No JVD present. No tracheal deviation present. No thyromegaly present.  Cardiovascular: Normal rate, regular rhythm and intact distal pulses.  Exam reveals no gallop and no friction rub.   Murmur heard.  Decrescendo systolic murmur is present with a grade of 1/6   No  diastolic murmur is present  Pulmonary/Chest: Effort normal and breath sounds normal. No stridor. No respiratory distress. He has no wheezes. He has no rales. He exhibits no tenderness.  Abdominal: Soft. Bowel sounds are normal. He exhibits no distension and no mass. There is no tenderness. There is no rebound and no guarding.  Musculoskeletal: Normal range of motion. He exhibits no edema or deformity.       Left foot: There is tenderness, bony tenderness and swelling. There is normal range of motion, normal capillary refill, no crepitus, no deformity and no laceration.       Feet:  Lymphadenopathy:    He has no cervical adenopathy.  Neurological: He is oriented to person, place, and time.  Skin: Skin is warm and dry. No rash noted. He is not diaphoretic. There is erythema. No pallor.  Vitals reviewed.   Lab Results  Component Value Date   WBC 7.9 06/05/2016   HGB 15.1 06/05/2016   HCT 45 06/05/2016   PLT 329 06/05/2016   GLUCOSE 131 (H) 05/06/2016   CHOL 151 06/05/2016   TRIG 74 06/05/2016   HDL 52 06/05/2016   LDLDIRECT 100.0 06/13/2009   LDLCALC 84 06/05/2016   ALT 87 (A) 06/05/2016   AST 20 07/17/2014   NA 144 06/05/2016   K 4.7 06/05/2016   CL 103 05/06/2016   CREATININE 1.2 06/05/2016   BUN 23 (A) 06/05/2016   CO2 31 05/06/2016   TSH 2.28 06/05/2016   PSA 1.05 10/28/2015   HGBA1C 6.6 07/08/2016   MICROALBUR 29.4 (H) 05/06/2016    Mr Brain Wo Contrast  Result Date: 06/17/2013 *RADIOLOGY REPORT* Clinical Data: 75 year old male with left eyelid drooping x1 week. Facial palsy. MRI HEAD WITHOUT CONTRAST Technique:  Multiplanar, multiecho pulse sequences of the brain and surrounding structures were obtained according to standard protocol without intravenous contrast. Comparison: Head CT without contrast 02/20/2009. Findings: Cerebral volume is within normal limits for age.  No restricted diffusion to suggest acute infarction.  No midline shift, mass effect, evidence of mass  lesion, ventriculomegaly, extra-axial collection or acute intracranial hemorrhage. Cervicomedullary junction and pituitary are within normal limits. Negative visualized cervical spine. Major intracranial vascular flow voids are preserved.  No cortical encephalomalacia identified. Numerous small foci of T2 and FLAIR hyperintensity scattered in the cerebral white matter.  Pattern is nonspecific.  Deep gray matter nuclei, brainstem, and cerebellum are within normal limits for age. Grossly normal visualized internal auditory structures.  Negative noncontrast appearance of the cavernous sinus.  No suprasellar mass or mass effect. Normal bone marrow signal.  Minimal mucosal thickening ethmoid and sphenoid sinuses. Visualized orbit soft tissues are within normal limits.  Negative scalp soft tissues.  Normal noncontrast appearance of the left facial nerve at the stylomastoid foramen. Left parotid gland appears normal. IMPRESSION: 1. No acute intracranial abnormality.  No explanation for left facial weakness identified. If symptoms worsen or remain unexplained, consider dedicated  face or IAC MRI. 2.  Mild to moderate for age nonspecific cerebral white matter signal changes, most commonly due to chronic small vessel disease. Original Report Authenticated By: Roselyn Reef, M.D.    Assessment & Plan:   Waitman was seen today for diabetes and gout.  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  Essential hypertension - his blood pressure is adequately  well-controlled.   I am having Mr. Nomura maintain his aspirin, Cholecalciferol, glucose blood, Lancets, BLOOD GLUCOSE TEST STRIPS, sitaGLIPtin, Insulin Glargine, pravastatin, losartan, atenolol-chlorthalidone, methylPREDNISolone, and colchicine.  Meds ordered this encounter  Medications  . DISCONTD: Insulin Glargine 300 UNIT/ML SOPN    Sig: Inject into the skin.     Follow-up: No Follow-up on file.  [name], MD

## 2016-07-08 NOTE — Progress Notes (Signed)
Pre visit review using our clinic review tool, if applicable. No additional management support is needed unless otherwise documented below in the visit note. 

## 2016-07-09 NOTE — Patient Instructions (Signed)

## 2016-07-16 DIAGNOSIS — Z85828 Personal history of other malignant neoplasm of skin: Secondary | ICD-10-CM | POA: Diagnosis not present

## 2016-08-21 DIAGNOSIS — Z794 Long term (current) use of insulin: Secondary | ICD-10-CM | POA: Diagnosis not present

## 2016-08-21 DIAGNOSIS — H2513 Age-related nuclear cataract, bilateral: Secondary | ICD-10-CM | POA: Diagnosis not present

## 2016-08-21 DIAGNOSIS — H353190 Nonexudative age-related macular degeneration, unspecified eye, stage unspecified: Secondary | ICD-10-CM | POA: Diagnosis not present

## 2016-08-21 DIAGNOSIS — H1851 Endothelial corneal dystrophy: Secondary | ICD-10-CM | POA: Diagnosis not present

## 2016-08-21 DIAGNOSIS — H5203 Hypermetropia, bilateral: Secondary | ICD-10-CM | POA: Diagnosis not present

## 2016-08-21 DIAGNOSIS — E119 Type 2 diabetes mellitus without complications: Secondary | ICD-10-CM | POA: Diagnosis not present

## 2016-08-21 DIAGNOSIS — H52203 Unspecified astigmatism, bilateral: Secondary | ICD-10-CM | POA: Diagnosis not present

## 2016-08-22 ENCOUNTER — Other Ambulatory Visit: Payer: Self-pay | Admitting: Internal Medicine

## 2016-08-22 DIAGNOSIS — M1000 Idiopathic gout, unspecified site: Secondary | ICD-10-CM

## 2016-08-25 ENCOUNTER — Ambulatory Visit (INDEPENDENT_AMBULATORY_CARE_PROVIDER_SITE_OTHER): Payer: Medicare Other

## 2016-08-25 DIAGNOSIS — Z23 Encounter for immunization: Secondary | ICD-10-CM

## 2016-09-01 LAB — HM DIABETES EYE EXAM

## 2016-09-08 ENCOUNTER — Other Ambulatory Visit: Payer: Self-pay | Admitting: Internal Medicine

## 2016-09-29 ENCOUNTER — Encounter: Payer: Self-pay | Admitting: Internal Medicine

## 2016-10-26 DIAGNOSIS — Z85828 Personal history of other malignant neoplasm of skin: Secondary | ICD-10-CM | POA: Diagnosis not present

## 2016-10-26 DIAGNOSIS — L821 Other seborrheic keratosis: Secondary | ICD-10-CM | POA: Diagnosis not present

## 2016-11-12 ENCOUNTER — Ambulatory Visit (INDEPENDENT_AMBULATORY_CARE_PROVIDER_SITE_OTHER): Payer: Medicare Other | Admitting: Internal Medicine

## 2016-11-12 ENCOUNTER — Other Ambulatory Visit (INDEPENDENT_AMBULATORY_CARE_PROVIDER_SITE_OTHER): Payer: Medicare Other

## 2016-11-12 ENCOUNTER — Encounter: Payer: Self-pay | Admitting: Internal Medicine

## 2016-11-12 VITALS — BP 118/70 | HR 60 | Temp 98.1°F | Resp 16 | Ht 71.0 in | Wt 244.5 lb

## 2016-11-12 DIAGNOSIS — K7010 Alcoholic hepatitis without ascites: Secondary | ICD-10-CM | POA: Diagnosis not present

## 2016-11-12 DIAGNOSIS — E118 Type 2 diabetes mellitus with unspecified complications: Secondary | ICD-10-CM

## 2016-11-12 DIAGNOSIS — Z23 Encounter for immunization: Secondary | ICD-10-CM | POA: Diagnosis not present

## 2016-11-12 DIAGNOSIS — M1000 Idiopathic gout, unspecified site: Secondary | ICD-10-CM

## 2016-11-12 DIAGNOSIS — I10 Essential (primary) hypertension: Secondary | ICD-10-CM

## 2016-11-12 DIAGNOSIS — Z794 Long term (current) use of insulin: Secondary | ICD-10-CM

## 2016-11-12 DIAGNOSIS — Z0000 Encounter for general adult medical examination without abnormal findings: Secondary | ICD-10-CM | POA: Diagnosis not present

## 2016-11-12 LAB — BASIC METABOLIC PANEL
BUN: 22 mg/dL (ref 6–23)
CHLORIDE: 102 meq/L (ref 96–112)
CO2: 33 meq/L — AB (ref 19–32)
CREATININE: 1.37 mg/dL (ref 0.40–1.50)
Calcium: 9.3 mg/dL (ref 8.4–10.5)
GFR: 53.45 mL/min — ABNORMAL LOW (ref >60.00)
GLUCOSE: 146 mg/dL — AB (ref 70–99)
POTASSIUM: 3.8 meq/L (ref 3.5–5.1)
Sodium: 139 mEq/L (ref 135–145)

## 2016-11-12 LAB — HEPATIC FUNCTION PANEL
ALBUMIN: 4.1 g/dL (ref 3.5–5.2)
ALK PHOS: 50 U/L (ref 39–117)
ALT: 20 U/L (ref 0–53)
AST: 19 U/L (ref 0–37)
Bilirubin, Direct: 0.1 mg/dL (ref 0.0–0.3)
Total Bilirubin: 0.5 mg/dL (ref 0.2–1.2)
Total Protein: 6.7 g/dL (ref 6.0–8.3)

## 2016-11-12 LAB — GAMMA GT: GGT: 29 U/L (ref 7–51)

## 2016-11-12 LAB — URIC ACID: Uric Acid, Serum: 7.6 mg/dL (ref 4.0–7.8)

## 2016-11-12 LAB — HEMOGLOBIN A1C: HEMOGLOBIN A1C: 7.1 % — AB (ref 4.6–6.5)

## 2016-11-12 MED ORDER — FEBUXOSTAT 40 MG PO TABS: 40.0000 mg | ORAL_TABLET | Freq: Every day | ORAL | 3 refills | Status: DC

## 2016-11-12 NOTE — Patient Instructions (Signed)

## 2016-11-12 NOTE — Progress Notes (Signed)
Pre visit review using our clinic review tool, if applicable. No additional management support is needed unless otherwise documented below in the visit note. 

## 2016-11-12 NOTE — Progress Notes (Signed)
Subjective:  Patient ID: Ryan Duke, male    DOB: 1941-02-03  Age: 75 y.o. MRN: [id number]  CC: Annual Exam; Hypertension; and Diabetes   HPI Ryan Duke presents for an AWV/CPX.  A saw his wife 2 days prior to this visit and she complained about his alcohol intake. She tells me that he has had a few episodes over the last few months where he drank enough alcohol to become slurred and ataxic. The patient agrees today that he has had a few episodes where he drank too much. He says he tries to keep his alcohol intake to 4 liquor beverages a day but sometimes he exceeds that.  He has gained weight since I last saw him. He denies any recent episodes of chest pain, shortness of breath, palpitations, near syncope, edema, or fatigue.  He also tells me he has been maintaining good blood sugar control. He denies polys.  Past Medical History:  Diagnosis Date  . Diabetes mellitus    type 2  . Hyperlipidemia   . Hypertension   . Hypogonadism, male   . Pancreatitis 2008  . Renal insufficiency    No past surgical history on file.  reports that he quit smoking about 41 years ago. He has never used smokeless tobacco. He reports that he drinks about 12.6 oz of alcohol per week . He reports that he does not use drugs. family history includes Hypertension in his other. Allergies  Allergen Reactions  . Farxiga [Dapagliflozin]     gout  . Metformin And Related     acidosis  . Atorvastatin     REACTION: h/a  . Benazepril Hcl     Outpatient Medications Prior to Visit  Medication Sig Dispense Refill  . aspirin 81 MG chewable tablet Chew 81 mg by mouth daily.      Marland Kitchen atenolol-chlorthalidone (TENORETIC) 100-25 MG tablet TAKE 1 TABLET ONCE DAILY. 90 tablet 1  . Cholecalciferol 1000 UNITS tablet Take 1,000 Units by mouth daily.      . colchicine (COLCRYS) 0.6 MG tablet Take 1 tablet (0.6 mg total) by mouth 2 (two) times daily. 60 tablet 11  . glucose blood (BAYER CONTOUR TEST) test strip 1  each by Other route 2 (two) times daily. Use as instructed     . Insulin Glargine (TOUJEO SOLOSTAR) 300 UNIT/ML SOPN Inject 30 Units into the skin daily. 3 pen 0  . Lancets MISC Test blood sugar twice daily as directed. Dx:250.02 100 each prn  . losartan (COZAAR) 100 MG tablet TAKE 1 TABLET DAILY FOR HYPERTENSION. 90 tablet 3  . pravastatin (PRAVACHOL) 40 MG tablet TAKE 1 TABLET ONCE DAILY. 30 tablet 11  . sitaGLIPtin (JANUVIA) 100 MG tablet Take 1 tablet (100 mg total) by mouth daily. 90 tablet 3  . Glucose Blood (BLOOD GLUCOSE TEST STRIPS) STRP Test twice daily as directed Dx:250.02 50 each prn  . methylPREDNISolone (MEDROL DOSEPAK) 4 MG TBPK tablet TAKE AS DIRECTED 21 tablet 0   No facility-administered medications prior to visit.     ROS Review of Systems  Constitutional: Positive for unexpected weight change. Negative for activity change, appetite change, chills, diaphoresis and fatigue.  HENT: Negative.  Negative for trouble swallowing.   Eyes: Negative for visual disturbance.  Respiratory: Negative for cough, chest tightness, shortness of breath, wheezing and stridor.   Cardiovascular: Negative.  Negative for chest pain, palpitations and leg swelling.  Gastrointestinal: Negative for abdominal pain, blood in stool, constipation, diarrhea, nausea and vomiting.  Endocrine: Negative for cold intolerance, heat intolerance, polydipsia, polyphagia and polyuria.  Genitourinary: Negative.  Negative for decreased urine volume, difficulty urinating, dysuria, frequency and urgency.  Musculoskeletal: Negative.  Negative for back pain, myalgias and neck pain.  Skin: Negative.  Negative for color change and rash.  Allergic/Immunologic: Negative.   Neurological: Negative.  Negative for dizziness, speech difficulty, weakness, numbness and headaches.  Hematological: Negative.  Negative for adenopathy. Does not bruise/bleed easily.  Psychiatric/Behavioral: Positive for dysphoric mood. Negative for  behavioral problems, decreased concentration, sleep disturbance and suicidal ideas. The patient is not nervous/anxious.        He admits to feeling depressed but is not willing to take an antidepressant.    Objective:  BP 118/70 (BP Location: Left Arm, Patient Position: Sitting, Cuff Size: Large)   Pulse 60   Temp 98.1 F (36.7 C) (Oral)   Resp 16   Ht 5\' 11"  (1.803 m)   Wt 244 lb 8 oz (110.9 kg)   SpO2 96%   BMI 34.10 kg/m   BP Readings from Last 3 Encounters:  11/12/16 118/70  07/08/16 (!) 148/80  05/06/16 130/78    Wt Readings from Last 3 Encounters:  11/12/16 244 lb 8 oz (110.9 kg)  07/08/16 236 lb (107 kg)  05/06/16 240 lb (108.9 kg)    Physical Exam  Constitutional: He is oriented to person, place, and time. He appears well-developed and well-nourished. No distress.  HENT:  Mouth/Throat: Oropharynx is clear and moist. No oropharyngeal exudate.  Eyes: Conjunctivae are normal. Right eye exhibits no discharge. Left eye exhibits no discharge. No scleral icterus.  Neck: Normal range of motion. Neck supple. No JVD present. No tracheal deviation present. No thyromegaly present.  Cardiovascular: Normal rate, regular rhythm, S1 normal, S2 normal and intact distal pulses.  Exam reveals no gallop and no friction rub.   Murmur heard.  Systolic murmur is present with a grade of 1/6   No diastolic murmur is present  Pulmonary/Chest: Effort normal and breath sounds normal. No stridor. No respiratory distress. He has no wheezes. He has no rales. He exhibits no tenderness.  Abdominal: Soft. Bowel sounds are normal. He exhibits no distension and no mass. There is no tenderness. There is no rebound and no guarding.  Musculoskeletal: He exhibits no edema, tenderness or deformity.  Lymphadenopathy:    He has no cervical adenopathy.  Neurological: He is oriented to person, place, and time.  Skin: Skin is warm. No rash noted. He is not diaphoretic. No erythema. No pallor.  Psychiatric: He  has a normal mood and affect. His behavior is normal. Judgment and thought content normal.  Vitals reviewed.   Lab Results  Component Value Date   WBC 7.9 06/05/2016   HGB 15.1 06/05/2016   HCT 45 06/05/2016   PLT 329 06/05/2016   GLUCOSE 146 (H) 11/12/2016   CHOL 151 06/05/2016   TRIG 74 06/05/2016   HDL 52 06/05/2016   LDLDIRECT 100.0 06/13/2009   LDLCALC 84 06/05/2016   ALT 20 11/12/2016   AST 19 11/12/2016   NA 139 11/12/2016   K 3.8 11/12/2016   CL 102 11/12/2016   CREATININE 1.37 11/12/2016   BUN 22 11/12/2016   CO2 33 (H) 11/12/2016   TSH 2.28 06/05/2016   PSA 1.05 10/28/2015   HGBA1C 7.1 (H) 11/12/2016   MICROALBUR 29.4 (H) 05/06/2016    Mr Brain Wo Contrast  Result Date: 06/17/2013 *RADIOLOGY REPORT* Clinical Data: 75 year old male with left eyelid drooping x1 week.  Facial palsy. MRI HEAD WITHOUT CONTRAST Technique:  Multiplanar, multiecho pulse sequences of the brain and surrounding structures were obtained according to standard protocol without intravenous contrast. Comparison: Head CT without contrast 02/20/2009. Findings: Cerebral volume is within normal limits for age.  No restricted diffusion to suggest acute infarction.  No midline shift, mass effect, evidence of mass lesion, ventriculomegaly, extra-axial collection or acute intracranial hemorrhage. Cervicomedullary junction and pituitary are within normal limits. Negative visualized cervical spine. Major intracranial vascular flow voids are preserved.  No cortical encephalomalacia identified. Numerous small foci of T2 and FLAIR hyperintensity scattered in the cerebral white matter.  Pattern is nonspecific.  Deep gray matter nuclei, brainstem, and cerebellum are within normal limits for age. Grossly normal visualized internal auditory structures.  Negative noncontrast appearance of the cavernous sinus.  No suprasellar mass or mass effect. Normal bone marrow signal.  Minimal mucosal thickening ethmoid and sphenoid  sinuses. Visualized orbit soft tissues are within normal limits.  Negative scalp soft tissues.  Normal noncontrast appearance of the left facial nerve at the stylomastoid foramen. Left parotid gland appears normal. IMPRESSION: 1. No acute intracranial abnormality.  No explanation for left facial weakness identified. If symptoms worsen or remain unexplained, consider dedicated face or IAC MRI. 2.  Mild to moderate for age nonspecific cerebral white matter signal changes, most commonly due to chronic small vessel disease. Original Report Authenticated By: Roselyn Reef, M.D.    Assessment & Plan:   Ryan Duke was seen today for annual exam, hypertension and diabetes.  Diagnoses and all orders for this visit:  Type 2 diabetes mellitus with complication, with long-term current use of insulin ([place])- his A1c is up to 7.1% which is a little higher than before but still adequately well controlled. He will continue the current medications to control blood sugar but agrees to work on his lifestyle modifications. -     Basic metabolic panel; Future -     Hemoglobin A1c; Future  Essential hypertension- his blood pressure is adequately well controlled, electrolytes and renal function are stable. -     Basic metabolic panel; Future  Idiopathic gout, unspecified chronicity, unspecified site- he has not achieved his uric acid goal of less than 6, I've asked him to start taking Uloric to lower his uric acid level and to prevent future gouty episodes. -     Uric acid; Future -     febuxostat (ULORIC) 40 MG tablet; Take 1 tablet (40 mg total) by mouth daily.  Routine general medical examination at a health care facility  Alcoholic hepatitis without ascites- his liver enzymes are normal now, I spoke to him about the dangers of excessive alcohol intake and he agrees to limit his alcohol intake to 1-2 beverages a day, if he is unable to keep it limited at that then we will talk about him abstaining from alcohol and  possibly going to rehabilitation are starting to attend AA meetings. -     Gamma GT; Future -     Hepatic function panel; Future  Need for prophylactic vaccination against Streptococcus pneumoniae (pneumococcus) -     Pneumococcal polysaccharide vaccine 23-valent greater than or equal to 2yo subcutaneous/IM   I have discontinued Ryan Duke's BLOOD GLUCOSE TEST STRIPS and methylPREDNISolone. I am also having him start on febuxostat. Additionally, I am having him maintain his aspirin, Cholecalciferol, glucose blood, Lancets, sitaGLIPtin, Insulin Glargine, pravastatin, atenolol-chlorthalidone, colchicine, and losartan.  Meds ordered this encounter  Medications  . febuxostat (ULORIC) 40 MG tablet  Sig: Take 1 tablet (40 mg total) by mouth daily.    Dispense:  90 tablet    Refill:  3   See AVS for instructions about healthy living and anticipatory guidance.  Follow-up: Return in about 4 months (around 03/13/2017).  [name], MD

## 2016-11-15 NOTE — Assessment & Plan Note (Signed)

## 2016-11-17 ENCOUNTER — Ambulatory Visit (INDEPENDENT_AMBULATORY_CARE_PROVIDER_SITE_OTHER): Payer: Medicare Other | Admitting: Family

## 2016-11-17 ENCOUNTER — Encounter: Payer: Self-pay | Admitting: Family

## 2016-11-17 VITALS — BP 140/80 | HR 72 | Temp 98.3°F

## 2016-11-17 DIAGNOSIS — M109 Gout, unspecified: Secondary | ICD-10-CM | POA: Diagnosis not present

## 2016-11-17 MED ORDER — METHYLPREDNISOLONE ACETATE 80 MG/ML IJ SUSP
80.0000 mg | Freq: Once | INTRAMUSCULAR | Status: AC
  Administered 2016-11-17: 80 mg via INTRAMUSCULAR

## 2016-11-17 MED ORDER — PREDNISONE 20 MG PO TABS: 20.0000 mg | ORAL_TABLET | Freq: Every day | ORAL | 0 refills | Status: DC

## 2016-11-17 MED ORDER — PREDNISONE 20 MG PO TABS: 20.0000 mg | ORAL_TABLET | Freq: Two times a day (BID) | ORAL | 0 refills | Status: DC

## 2016-11-17 NOTE — Progress Notes (Signed)
Subjective:    Patient ID: Ryan Duke, male    DOB: 12-16-1940, 75 y.o.   MRN: [id number]  Chief Complaint  Patient presents with  . Gout    HPI:  Ryan Duke is a 75 y.o. male who  has a past medical history of Diabetes mellitus; Hyperlipidemia; Hypertension; Hypogonadism, male; Pancreatitis (2008); and Renal insufficiency. and presents today for an acute office visit.  Associated symptoms of pain and redness located in his left great toe has been going on for about 1-2 days. Currently maintained on colchicine preventatively which has not helped very much. Denies fevers. No trauma or injury that he can recall. Was noted to have a slightly elevated uric acid level at most recent check and has not start the Uloric.   Allergies  Allergen Reactions  . Farxiga [Dapagliflozin]     gout  . Metformin And Related     acidosis  . Atorvastatin     REACTION: h/a  . Benazepril Hcl       Outpatient Medications Prior to Visit  Medication Sig Dispense Refill  . aspirin 81 MG chewable tablet Chew 81 mg by mouth daily.      Marland Kitchen atenolol-chlorthalidone (TENORETIC) 100-25 MG tablet TAKE 1 TABLET ONCE DAILY. 90 tablet 1  . Cholecalciferol 1000 UNITS tablet Take 1,000 Units by mouth daily.      . colchicine (COLCRYS) 0.6 MG tablet Take 1 tablet (0.6 mg total) by mouth 2 (two) times daily. 60 tablet 11  . febuxostat (ULORIC) 40 MG tablet Take 1 tablet (40 mg total) by mouth daily. 90 tablet 3  . glucose blood (BAYER CONTOUR TEST) test strip 1 each by Other route 2 (two) times daily. Use as instructed     . Insulin Glargine (TOUJEO SOLOSTAR) 300 UNIT/ML SOPN Inject 30 Units into the skin daily. 3 pen 0  . Lancets MISC Test blood sugar twice daily as directed. Dx:250.02 100 each prn  . losartan (COZAAR) 100 MG tablet TAKE 1 TABLET DAILY FOR HYPERTENSION. 90 tablet 3  . pravastatin (PRAVACHOL) 40 MG tablet TAKE 1 TABLET ONCE DAILY. 30 tablet 11  . sitaGLIPtin (JANUVIA) 100 MG tablet Take 1  tablet (100 mg total) by mouth daily. 90 tablet 3   No facility-administered medications prior to visit.      Review of Systems  Constitutional: Negative for chills and fever.  Musculoskeletal: Positive for joint swelling.       Positive for left great toe pain.  Neurological: Negative for weakness and numbness.      Objective:    BP 140/80   Pulse 72   Temp 98.3 F (36.8 C)   SpO2 98%  Nursing note and vital signs reviewed.  Physical Exam  Constitutional: He is oriented to person, place, and time. He appears well-developed and well-nourished. No distress.  Cardiovascular: Normal rate, regular rhythm, normal heart sounds and intact distal pulses.   Pulmonary/Chest: Effort normal and breath sounds normal.  Musculoskeletal:  Left great toe - No obvious deformity with significant redness and swelling located over the MCP joint. Tender to the touch with no crepitus or deformity. Range of motion deferred secondary to pain.   Neurological: He is alert and oriented to person, place, and time.  Skin: Skin is warm and dry.  Psychiatric: He has a normal mood and affect. His behavior is normal. Judgment and thought content normal.       Assessment & Plan:   Problem List Items Addressed This Visit  Musculoskeletal and Integument   Acute gout involving toe of left foot - Primary    Symptoms and exam consistent with a gout flare. In office injection of 80 mg of DepoMedrol provided. Instructed to monitor blood sugars. Written prescription for prednisone provided if needed. Will take colchicine for flare and then return to prophylactic dosage. Follow up if symptoms worsen or do not improve.       Relevant Medications   predniSONE (DELTASONE) 20 MG tablet   methylPREDNISolone acetate (DEPO-MEDROL) injection 80 mg (Completed)       I have changed Ryan Duke's predniSONE. I am also having him maintain his aspirin, Cholecalciferol, glucose blood, Lancets, sitaGLIPtin, Insulin  Glargine, pravastatin, atenolol-chlorthalidone, colchicine, losartan, and febuxostat. We administered methylPREDNISolone acetate.   Meds ordered this encounter  Medications  . DISCONTD: predniSONE (DELTASONE) 20 MG tablet    Sig: Take 1 tablet (20 mg total) by mouth daily with breakfast.    Dispense:  10 tablet    Refill:  0    Order Specific Question:   Supervising Provider    Answer:   Pricilla Holm A [id number]  . predniSONE (DELTASONE) 20 MG tablet    Sig: Take 1 tablet (20 mg total) by mouth 2 (two) times daily with a meal.    Dispense:  10 tablet    Refill:  0    Order Specific Question:   Supervising Provider    Answer:   Pricilla Holm A [id number]  . methylPREDNISolone acetate (DEPO-MEDROL) injection 80 mg     Follow-up: Return if symptoms worsen or fail to improve.  Mauricio Po, FNP

## 2016-11-17 NOTE — Patient Instructions (Signed)
Thank you for choosing [facility].  SUMMARY AND INSTRUCTIONS:  Medication:  Colchicine - take 2 tablets by mouth and then 1 an hour later.   Prednisone - Written prescription if needed.  Your prescription(s) have been submitted to your pharmacy or been printed and provided for you. Please take as directed and contact our office if you believe you are having problem(s) with the medication(s) or have any questions.  Follow up:  If your symptoms worsen or fail to improve, please contact our office for further instruction, or in case of emergency go directly to the emergency room at the closest medical facility.    Gout Gout is painful swelling that can occur in some of your joints. Gout is a type of arthritis. This condition is caused by having too much uric acid in your body. Uric acid is a chemical that forms when your body breaks down substances called purines. Purines are important for building body proteins. When your body has too much uric acid, sharp crystals can form and build up inside your joints. This causes pain and swelling. Gout attacks can happen quickly and be very painful (acute gout). Over time, the attacks can affect more joints and become more frequent (chronic gout). Gout can also cause uric acid to build up under your skin and inside your kidneys. What are the causes? This condition is caused by too much uric acid in your blood. This can occur because:  Your kidneys do not remove enough uric acid from your blood. This is the most common cause.  Your body makes too much uric acid. This can occur with some cancers and cancer treatments. It can also occur if your body is breaking down too many red blood cells (hemolytic anemia).  You eat too many foods that are high in purines. These foods include organ meats and some seafood. Alcohol, especially beer, is also high in purines. A gout attack may be triggered by trauma or stress. What increases the risk? This  condition is more likely to develop in people who:  Have a family history of gout.  Are male and middle-aged.  Are male and have gone through menopause.  Are obese.  Frequently drink alcohol, especially beer.  Are dehydrated.  Lose weight too quickly.  Have an organ transplant.  Have lead poisoning.  Take certain medicines, including aspirin, cyclosporine, diuretics, levodopa, and niacin.  Have kidney disease or psoriasis. What are the signs or symptoms? An attack of acute gout happens quickly. It usually occurs in just one joint. The most common place is the big toe. Attacks often start at night. Other joints that may be affected include joints of the feet, ankle, knee, fingers, wrist, or elbow. Symptoms may include:  Severe pain.  Warmth.  Swelling.  Stiffness.  Tenderness. The affected joint may be very painful to touch.  Shiny, red, or purple skin.  Chills and fever. Chronic gout may cause symptoms more frequently. More joints may be involved. You may also have white or yellow lumps (tophi) on your hands or feet or in other areas near your joints. How is this diagnosed? This condition is diagnosed based on your symptoms, medical history, and physical exam. You may have tests, such as:  Blood tests to measure uric acid levels.  Removal of joint fluid with a needle (aspiration) to look for uric acid crystals.  X-rays to look for joint damage. How is this treated? Treatment for this condition has two phases: treating an acute attack and  preventing future attacks. Acute gout treatment may include medicines to reduce pain and swelling, including:  NSAIDs.  Steroids. These are strong anti-inflammatory medicines that can be taken by mouth (orally) or injected into a joint.  Colchicine. This medicine relieves pain and swelling when it is taken soon after an attack. It can be given orally or through an IV tube. Preventive treatment may include:  Daily use of  smaller doses of NSAIDs or colchicine.  Use of a medicine that reduces uric acid levels in your blood.  Changes to your diet. You may need to see a specialist about healthy eating (dietitian). Follow these instructions at home: During a Gout Attack  If directed, apply ice to the affected area:  Put ice in a plastic bag.  Place a towel between your skin and the bag.  Leave the ice on for 20 minutes, 2-3 times a day.  Rest the joint as much as possible. If the affected joint is in your leg, you may be given crutches to use.  Raise (elevate) the affected joint above the level of your heart as often as possible.  Drink enough fluids to keep your urine clear or pale yellow.  Take over-the-counter and prescription medicines only as told by your health care provider.  Do not drive or operate heavy machinery while taking prescription pain medicine.  Follow instructions from your health care provider about eating or drinking restrictions.  Return to your normal activities as told by your health care provider. Ask your health care provider what activities are safe for you. Avoiding Future Gout Attacks  Follow a low-purine diet as told by your dietitian or health care provider. Avoid foods and drinks that are high in purines, including liver, kidney, anchovies, asparagus, herring, mushrooms, mussels, and beer.  Limit alcohol intake to no more than 1 drink a day for nonpregnant women and 2 drinks a day for men. One drink equals 12 oz of beer, 5 oz of wine, or 1 oz of hard liquor.  Maintain a healthy weight or lose weight if you are overweight. If you want to lose weight, talk with your health care provider. It is important that you do not lose weight too quickly.  Start or maintain an exercise program as told by your health care provider.  Drink enough fluids to keep your urine clear or pale yellow.  Take over-the-counter and prescription medicines only as told by your health care  provider.  Keep all follow-up visits as told by your health care provider. This is important. Contact a health care provider if:  You have another gout attack.  You continue to have symptoms of a gout attack after10 days of treatment.  You have side effects from your medicines.  You have chills or a fever.  You have burning pain when you urinate.  You have pain in your lower back or belly. Get help right away if:  You have severe or uncontrolled pain.  You cannot urinate. This information is not intended to replace advice given to you by your health care provider. Make sure you discuss any questions you have with your health care provider. Document Released: 11/13/2000 Document Revised: 04/23/2016 Document Reviewed: 08/29/2015 Elsevier Interactive Patient Education  2017 [employer].

## 2016-11-17 NOTE — Assessment & Plan Note (Deleted)
Symptoms and exam consistent with a gout flare. In office injection of 80 mg of DepoMedrol provided. Instructed to monitor blood sugars. Written prescription for prednisone provided if needed. Will take colchicine for flare and then return to prophylactic dosage. Follow up if symptoms worsen or do not improve.

## 2016-11-17 NOTE — Progress Notes (Signed)
Pre visit review using our clinic review tool, if applicable. No additional management support is needed unless otherwise documented below in the visit note. 

## 2016-11-17 NOTE — Assessment & Plan Note (Signed)
Symptoms and exam consistent with a gout flare. In office injection of 80 mg of DepoMedrol provided. Instructed to monitor blood sugars. Written prescription for prednisone provided if needed. Will take colchicine for flare and then return to prophylactic dosage. Follow up if symptoms worsen or do not improve.

## 2016-12-07 ENCOUNTER — Other Ambulatory Visit: Payer: Self-pay | Admitting: Internal Medicine

## 2016-12-08 ENCOUNTER — Encounter: Payer: Self-pay | Admitting: Internal Medicine

## 2016-12-08 ENCOUNTER — Other Ambulatory Visit: Payer: Self-pay | Admitting: Internal Medicine

## 2016-12-08 MED ORDER — CEFDINIR 300 MG PO CAPS: 300.0000 mg | ORAL_CAPSULE | Freq: Two times a day (BID) | ORAL | 0 refills | Status: AC

## 2016-12-08 MED ORDER — PROMETHAZINE-DM 6.25-15 MG/5ML PO SYRP: 5.0000 mL | ORAL_SOLUTION | Freq: Four times a day (QID) | ORAL | 0 refills | Status: DC | PRN

## 2016-12-21 ENCOUNTER — Ambulatory Visit (INDEPENDENT_AMBULATORY_CARE_PROVIDER_SITE_OTHER)
Admission: RE | Admit: 2016-12-21 | Discharge: 2016-12-21 | Disposition: A | Discharge status: Home / Self Care | Payer: Medicare Other | Source: Ambulatory Visit | Attending: Internal Medicine | Admitting: Internal Medicine

## 2016-12-21 ENCOUNTER — Ambulatory Visit: Payer: Medicare Other | Admitting: Nurse Practitioner

## 2016-12-21 ENCOUNTER — Encounter: Payer: Self-pay | Admitting: Internal Medicine

## 2016-12-21 ENCOUNTER — Other Ambulatory Visit: Payer: Self-pay | Admitting: Internal Medicine

## 2016-12-21 DIAGNOSIS — R05 Cough: Secondary | ICD-10-CM

## 2016-12-21 DIAGNOSIS — R059 Cough, unspecified: Secondary | ICD-10-CM

## 2016-12-28 ENCOUNTER — Encounter: Payer: Self-pay | Admitting: Internal Medicine

## 2016-12-28 ENCOUNTER — Ambulatory Visit (INDEPENDENT_AMBULATORY_CARE_PROVIDER_SITE_OTHER): Payer: Medicare Other | Admitting: Internal Medicine

## 2016-12-28 VITALS — BP 136/82 | HR 64 | Temp 98.2°F | Resp 16 | Ht 71.0 in | Wt 239.1 lb

## 2016-12-28 DIAGNOSIS — J069 Acute upper respiratory infection, unspecified: Secondary | ICD-10-CM | POA: Diagnosis not present

## 2016-12-28 DIAGNOSIS — R05 Cough: Secondary | ICD-10-CM

## 2016-12-28 DIAGNOSIS — R059 Cough, unspecified: Secondary | ICD-10-CM

## 2016-12-28 DIAGNOSIS — J4520 Mild intermittent asthma, uncomplicated: Secondary | ICD-10-CM | POA: Diagnosis not present

## 2016-12-28 DIAGNOSIS — B9789 Other viral agents as the cause of diseases classified elsewhere: Secondary | ICD-10-CM | POA: Diagnosis not present

## 2016-12-28 LAB — POCT EXHALED NITRIC OXIDE: FeNO level (ppb): 37

## 2016-12-28 MED ORDER — HYDROCODONE-HOMATROPINE 5-1.5 MG/5ML PO SYRP: 5.0000 mL | ORAL_SOLUTION | Freq: Three times a day (TID) | ORAL | 0 refills | Status: DC | PRN

## 2016-12-28 MED ORDER — FLUTICASONE FUROATE-VILANTEROL 100-25 MCG/INH IN AEPB: 1.0000 | INHALATION_SPRAY | Freq: Every day | RESPIRATORY_TRACT | 11 refills | Status: DC

## 2016-12-28 NOTE — Progress Notes (Signed)
Subjective:  Patient ID: Ryan Duke, male    DOB: 1941/04/08  Age: 76 y.o. MRN: [place]:[id number]  CC: Cough and URI   HPI Ryan Duke presents for a nonproductive cough for several weeks with occasional chills and fatigue but no fever, night sweats, chest pain, hemoptysis, shortness of breath, or wheezing. He had a normal chest x-ray within the last week. He was trying to control the cough with Phenergan DM but he was still waking up at night coughing. He switched to his wife's cough suppressant, Hycodan, and has finally gotten some symptom relief.  Outpatient Medications Prior to Visit  Medication Sig Dispense Refill  . aspirin 81 MG chewable tablet Chew 81 mg by mouth daily.      Marland Kitchen atenolol-chlorthalidone (TENORETIC) 100-25 MG tablet TAKE 1 TABLET ONCE DAILY. 90 tablet 1  . Cholecalciferol 1000 UNITS tablet Take 1,000 Units by mouth daily.      . colchicine (COLCRYS) 0.6 MG tablet Take 1 tablet (0.6 mg total) by mouth 2 (two) times daily. 60 tablet 11  . febuxostat (ULORIC) 40 MG tablet Take 1 tablet (40 mg total) by mouth daily. 90 tablet 3  . glucose blood (BAYER CONTOUR TEST) test strip 1 each by Other route 2 (two) times daily. Use as instructed     . Insulin Glargine (TOUJEO SOLOSTAR) 300 UNIT/ML SOPN Inject 30 Units into the skin daily. 3 pen 0  . Lancets MISC Test blood sugar twice daily as directed. Dx:250.02 100 each prn  . losartan (COZAAR) 100 MG tablet TAKE 1 TABLET DAILY FOR HYPERTENSION. 90 tablet 3  . pravastatin (PRAVACHOL) 40 MG tablet TAKE 1 TABLET ONCE DAILY. 90 tablet 3  . sitaGLIPtin (JANUVIA) 100 MG tablet Take 1 tablet (100 mg total) by mouth daily. 90 tablet 3  . promethazine-dextromethorphan (PROMETHAZINE-DM) 6.25-15 MG/5ML syrup Take 5 mLs by mouth 4 (four) times daily as needed for cough. 118 mL 0   No facility-administered medications prior to visit.     ROS Review of Systems  Constitutional: Positive for chills and fatigue. Negative for activity  change, appetite change, diaphoresis, fever and unexpected weight change.  HENT: Negative for congestion, facial swelling, rhinorrhea, sinus pressure, sore throat, trouble swallowing and voice change.   Eyes: Negative for visual disturbance.  Respiratory: Positive for cough. Negative for chest tightness, shortness of breath and wheezing.   Cardiovascular: Negative for chest pain, palpitations and leg swelling.  Gastrointestinal: Negative.  Negative for abdominal pain, constipation, diarrhea, nausea and vomiting.  Endocrine: Negative.   Genitourinary: Negative.  Negative for difficulty urinating.  Musculoskeletal: Negative.  Negative for back pain and neck pain.  Skin: Negative for color change and rash.  Neurological: Negative.  Negative for dizziness.  Hematological: Negative.   Psychiatric/Behavioral: Negative.     Objective:  BP 136/82 (BP Location: Left Arm, Patient Position: Sitting, Cuff Size: Normal)   Pulse 64   Temp 98.2 F (36.8 C) (Oral)   Resp 16   Ht 5\' 11"  (1.803 m)   Wt 239 lb 1.3 oz (108.4 kg)   SpO2 96%   BMI 33.34 kg/m   BP Readings from Last 3 Encounters:  12/28/16 136/82  11/17/16 140/80  11/12/16 118/70    Wt Readings from Last 3 Encounters:  12/28/16 239 lb 1.3 oz (108.4 kg)  11/12/16 244 lb 8 oz (110.9 kg)  07/08/16 236 lb (107 kg)    Physical Exam  Constitutional: He is oriented to person, place, and time.  Non-toxic appearance.  He does not have a sickly appearance. He does not appear ill. No distress.  HENT:  Mouth/Throat: Oropharynx is clear and moist. No oropharyngeal exudate.  Eyes: Conjunctivae are normal. Right eye exhibits no discharge. Left eye exhibits no discharge. No scleral icterus.  Neck: Normal range of motion. Neck supple. No JVD present. No tracheal deviation present. No thyromegaly present.  Cardiovascular: Normal rate, regular rhythm, normal heart sounds and intact distal pulses.  Exam reveals no gallop and no friction rub.   No  murmur heard. Pulmonary/Chest: Effort normal. No accessory muscle usage or stridor. No tachypnea. No respiratory distress. He has no decreased breath sounds. He has no wheezes. He has no rhonchi. He has rales in the right lower field and the left lower field. He exhibits no tenderness.  Abdominal: Soft. Bowel sounds are normal. He exhibits no distension and no mass. There is no tenderness. There is no rebound and no guarding.  Musculoskeletal: Normal range of motion. He exhibits no edema, tenderness or deformity.  Lymphadenopathy:    He has no cervical adenopathy.  Neurological: He is oriented to person, place, and time.  Skin: Skin is warm and dry. No rash noted. He is not diaphoretic. No erythema. No pallor.  Vitals reviewed.   Lab Results  Component Value Date   WBC 7.9 06/05/2016   HGB 15.1 06/05/2016   HCT 45 06/05/2016   PLT 329 06/05/2016   GLUCOSE 146 (H) 11/12/2016   CHOL 151 06/05/2016   TRIG 74 06/05/2016   HDL 52 06/05/2016   LDLDIRECT 100.0 06/13/2009   LDLCALC 84 06/05/2016   ALT 20 11/12/2016   AST 19 11/12/2016   NA 139 11/12/2016   K 3.8 11/12/2016   CL 102 11/12/2016   CREATININE 1.37 11/12/2016   BUN 22 11/12/2016   CO2 33 (H) 11/12/2016   TSH 2.28 06/05/2016   PSA 1.05 10/28/2015   HGBA1C 7.1 (H) 11/12/2016   MICROALBUR 29.4 (H) 05/06/2016    Dg Chest 2 View  Result Date: 12/21/2016 CLINICAL DATA:  Cough and congestion for several days, initial encounter EXAM: CHEST  2 VIEW COMPARISON:  03/04/2011 FINDINGS: Cardiac shadow is stable. Tortuous aorta is again noted and stable. The lungs are well aerated bilaterally. No focal infiltrate is seen. Degenerative changes of thoracic spine are noted. IMPRESSION: No active cardiopulmonary disease. Electronically Signed   By: Inez Catalina M.D.   On: 12/21/2016 13:50    Assessment & Plan:   Ryan Duke was seen today for cough and uri.  Diagnoses and all orders for this visit:  Cough- his recent chest x-ray was  normal, I don't think he has a bacterial infection but has lingering symptoms from a viral infection. He has a moderately elevated FeNO score so will start an inhaled corticosteroid. -     HYDROcodone-homatropine (HYCODAN) 5-1.5 MG/5ML syrup; Take 5 mLs by mouth every 8 (eight) hours as needed for cough. -     POCT EXHALED NITRIC OXIDE  Viral URI with cough -     HYDROcodone-homatropine (HYCODAN) 5-1.5 MG/5ML syrup; Take 5 mLs by mouth every 8 (eight) hours as needed for cough.  Mild intermittent asthma without complication- his FeNO score is mildly elevated at 37 so I have recommended that he start using a LABA/ICS inhaler -     fluticasone furoate-vilanterol (BREO ELLIPTA) 100-25 MCG/INH AEPB; Inhale 1 puff into the lungs daily.   I have discontinued Mr. Formby's promethazine-dextromethorphan. I am also having him start on HYDROcodone-homatropine and fluticasone furoate-vilanterol.  Additionally, I am having him maintain his aspirin, Cholecalciferol, glucose blood, Lancets, sitaGLIPtin, Insulin Glargine, colchicine, losartan, febuxostat, atenolol-chlorthalidone, and pravastatin.  Meds ordered this encounter  Medications  . HYDROcodone-homatropine (HYCODAN) 5-1.5 MG/5ML syrup    Sig: Take 5 mLs by mouth every 8 (eight) hours as needed for cough.    Dispense:  120 mL    Refill:  0  . fluticasone furoate-vilanterol (BREO ELLIPTA) 100-25 MCG/INH AEPB    Sig: Inhale 1 puff into the lungs daily.    Dispense:  30 each    Refill:  11     Follow-up: No Follow-up on file.  [name], MD

## 2016-12-28 NOTE — Patient Instructions (Signed)
Cough, Adult Coughing is a reflex that clears your throat and your airways. Coughing helps to heal and protect your lungs. It is normal to cough occasionally, but a cough that happens with other symptoms or lasts a long time may be a sign of a condition that needs treatment. A cough may last only 2-3 weeks (acute), or it may last longer than 8 weeks (chronic). What are the causes? Coughing is commonly caused by:  Breathing in substances that irritate your lungs.  A viral or bacterial respiratory infection.  Allergies.  Asthma.  Postnasal drip.  Smoking.  Acid backing up from the stomach into the esophagus (gastroesophageal reflux).  Certain medicines.  Chronic lung problems, including COPD (or rarely, lung cancer).  Other medical conditions such as heart failure.  Follow these instructions at home: Pay attention to any changes in your symptoms. Take these actions to help with your discomfort:  Take medicines only as told by your health care provider. ? If you were prescribed an antibiotic medicine, take it as told by your health care provider. Do not stop taking the antibiotic even if you start to feel better. ? Talk with your health care provider before you take a cough suppressant medicine.  Drink enough fluid to keep your urine clear or pale yellow.  If the air is dry, use a cold steam vaporizer or humidifier in your bedroom or your home to help loosen secretions.  Avoid anything that causes you to cough at work or at home.  If your cough is worse at night, try sleeping in a semi-upright position.  Avoid cigarette smoke. If you smoke, quit smoking. If you need help quitting, ask your health care provider.  Avoid caffeine.  Avoid alcohol.  Rest as needed.  Contact a health care provider if:  You have new symptoms.  You cough up pus.  Your cough does not get better after 2-3 weeks, or your cough gets worse.  You cannot control your cough with suppressant  medicines and you are losing sleep.  You develop pain that is getting worse or pain that is not controlled with pain medicines.  You have a fever.  You have unexplained weight loss.  You have night sweats. Get help right away if:  You cough up blood.  You have difficulty breathing.  Your heartbeat is very fast. This information is not intended to replace advice given to you by your health care provider. Make sure you discuss any questions you have with your health care provider. Document Released: 05/15/2011 Document Revised: 04/23/2016 Document Reviewed: 01/23/2015 Elsevier Interactive Patient Education  2017 [employer]  

## 2016-12-28 NOTE — Progress Notes (Signed)
Pre visit review using our clinic review tool, if applicable. No additional management support is needed unless otherwise documented below in the visit note. 

## 2017-02-04 ENCOUNTER — Encounter: Payer: Self-pay | Admitting: Internal Medicine

## 2017-03-15 ENCOUNTER — Other Ambulatory Visit (INDEPENDENT_AMBULATORY_CARE_PROVIDER_SITE_OTHER): Payer: Medicare Other

## 2017-03-15 ENCOUNTER — Encounter: Payer: Self-pay | Admitting: Internal Medicine

## 2017-03-15 ENCOUNTER — Ambulatory Visit (INDEPENDENT_AMBULATORY_CARE_PROVIDER_SITE_OTHER): Payer: Medicare Other | Admitting: Internal Medicine

## 2017-03-15 VITALS — BP 120/70 | HR 57 | Temp 97.8°F | Ht 71.0 in | Wt 244.0 lb

## 2017-03-15 DIAGNOSIS — Z794 Long term (current) use of insulin: Secondary | ICD-10-CM

## 2017-03-15 DIAGNOSIS — I10 Essential (primary) hypertension: Secondary | ICD-10-CM

## 2017-03-15 DIAGNOSIS — E118 Type 2 diabetes mellitus with unspecified complications: Secondary | ICD-10-CM | POA: Diagnosis not present

## 2017-03-15 DIAGNOSIS — E785 Hyperlipidemia, unspecified: Secondary | ICD-10-CM

## 2017-03-15 LAB — LIPID PANEL
CHOLESTEROL: 172 mg/dL (ref 0–200)
HDL: 51.2 mg/dL (ref >39.00)
LDL CALC: 102 mg/dL — AB (ref 0–99)
NonHDL: 120.33
TRIGLYCERIDES: 90 mg/dL (ref 0.0–149.0)
Total CHOL/HDL Ratio: 3
VLDL: 18 mg/dL (ref 0.0–40.0)

## 2017-03-15 LAB — COMPREHENSIVE METABOLIC PANEL
ALT: 21 U/L (ref 0–53)
AST: 19 U/L (ref 0–37)
Albumin: 4.2 g/dL (ref 3.5–5.2)
Alkaline Phosphatase: 48 U/L (ref 39–117)
BUN: 24 mg/dL — AB (ref 6–23)
CALCIUM: 9.7 mg/dL (ref 8.4–10.5)
CO2: 29 mEq/L (ref 19–32)
CREATININE: 1.41 mg/dL (ref 0.40–1.50)
Chloride: 101 mEq/L (ref 96–112)
GFR: 51.66 mL/min — AB (ref >60.00)
Glucose, Bld: 161 mg/dL — ABNORMAL HIGH (ref 70–99)
Potassium: 3.8 mEq/L (ref 3.5–5.1)
Sodium: 137 mEq/L (ref 135–145)
TOTAL PROTEIN: 7.2 g/dL (ref 6.0–8.3)
Total Bilirubin: 0.7 mg/dL (ref 0.2–1.2)

## 2017-03-15 LAB — CBC WITH DIFFERENTIAL/PLATELET
BASOS PCT: 1.2 % (ref 0.0–3.0)
Basophils Absolute: 0.1 10*3/uL (ref 0.0–0.1)
EOS ABS: 0.3 10*3/uL (ref 0.0–0.7)
EOS PCT: 4.2 % (ref 0.0–5.0)
HCT: 47.4 % (ref 39.0–52.0)
Hemoglobin: 16 g/dL (ref 13.0–17.0)
LYMPHS ABS: 1.7 10*3/uL (ref 0.7–4.0)
Lymphocytes Relative: 21.8 % (ref 12.0–46.0)
MCHC: 33.9 g/dL (ref 30.0–36.0)
MCV: 94.4 fl (ref 78.0–100.0)
MONO ABS: 0.8 10*3/uL (ref 0.1–1.0)
Monocytes Relative: 10.8 % (ref 3.0–12.0)
NEUTROS ABS: 4.8 10*3/uL (ref 1.4–7.7)
Neutrophils Relative %: 62 % (ref 43.0–77.0)
PLATELETS: 238 10*3/uL (ref 150.0–400.0)
RBC: 5.02 Mil/uL (ref 4.22–5.81)
RDW: 13.7 % (ref 11.5–15.5)
WBC: 7.7 10*3/uL (ref 4.0–10.5)

## 2017-03-15 LAB — URINALYSIS, ROUTINE W REFLEX MICROSCOPIC
Bilirubin Urine: NEGATIVE
Hgb urine dipstick: NEGATIVE
Ketones, ur: NEGATIVE
Leukocytes, UA: NEGATIVE
NITRITE: NEGATIVE
PH: 6 (ref 5.0–8.0)
RBC / HPF: NONE SEEN (ref 0–?)
SPECIFIC GRAVITY, URINE: 1.02 (ref 1.000–1.030)
Total Protein, Urine: 100 — AB
URINE GLUCOSE: NEGATIVE
UROBILINOGEN UA: 0.2 (ref 0.0–1.0)
WBC UA: NONE SEEN (ref 0–?)

## 2017-03-15 LAB — MICROALBUMIN / CREATININE URINE RATIO
CREATININE, U: 169.4 mg/dL
MICROALB UR: 41.1 mg/dL — AB (ref 0.0–1.9)
Microalb Creat Ratio: 24.3 mg/g (ref 0.0–30.0)

## 2017-03-15 LAB — HEMOGLOBIN A1C: HEMOGLOBIN A1C: 6.9 % — AB (ref 4.6–6.5)

## 2017-03-15 NOTE — Progress Notes (Signed)
Pre visit review using our clinic review tool, if applicable. No additional management support is needed unless otherwise documented below in the visit note. 

## 2017-03-15 NOTE — Progress Notes (Signed)
Subjective:  Patient ID: Ryan Duke, male    DOB: Jan 25, 1941  Age: 76 y.o. MRN: [id number]  CC: Hyperlipidemia; Hypertension; and Diabetes   HPI Ryan Duke presents for f/up - He is concerned that he has gained 5 pounds since I last saw him but he otherwise feels well and offers no complaints. He does admit to multiple dietary indiscretions.  Outpatient Medications Prior to Visit  Medication Sig Dispense Refill  . aspirin 81 MG chewable tablet Chew 81 mg by mouth daily.      Marland Kitchen atenolol-chlorthalidone (TENORETIC) 100-25 MG tablet TAKE 1 TABLET ONCE DAILY. 90 tablet 1  . Cholecalciferol 1000 UNITS tablet Take 1,000 Units by mouth daily.      . colchicine (COLCRYS) 0.6 MG tablet Take 1 tablet (0.6 mg total) by mouth 2 (two) times daily. 60 tablet 11  . febuxostat (ULORIC) 40 MG tablet Take 1 tablet (40 mg total) by mouth daily. 90 tablet 3  . fluticasone furoate-vilanterol (BREO ELLIPTA) 100-25 MCG/INH AEPB Inhale 1 puff into the lungs daily. 30 each 11  . glucose blood (BAYER CONTOUR TEST) test strip 1 each by Other route 2 (two) times daily. Use as instructed     . Insulin Glargine (TOUJEO SOLOSTAR) 300 UNIT/ML SOPN Inject 30 Units into the skin daily. 3 pen 0  . Lancets MISC Test blood sugar twice daily as directed. Dx:250.02 100 each prn  . losartan (COZAAR) 100 MG tablet TAKE 1 TABLET DAILY FOR HYPERTENSION. 90 tablet 3  . pravastatin (PRAVACHOL) 40 MG tablet TAKE 1 TABLET ONCE DAILY. 90 tablet 3  . sitaGLIPtin (JANUVIA) 100 MG tablet Take 1 tablet (100 mg total) by mouth daily. 90 tablet 3  . HYDROcodone-homatropine (HYCODAN) 5-1.5 MG/5ML syrup Take 5 mLs by mouth every 8 (eight) hours as needed for cough. 120 mL 0   No facility-administered medications prior to visit.     ROS Review of Systems  Constitutional: Positive for unexpected weight change. Negative for appetite change, chills, diaphoresis and fatigue.  HENT: Negative.   Eyes: Negative.   Respiratory:  Negative for cough, chest tightness, shortness of breath and wheezing.   Cardiovascular: Negative for chest pain, palpitations and leg swelling.  Gastrointestinal: Negative for abdominal pain, constipation, diarrhea, nausea and vomiting.  Endocrine: Negative.  Negative for polydipsia, polyphagia and polyuria.  Genitourinary: Negative.  Negative for difficulty urinating.  Musculoskeletal: Negative.  Negative for back pain and neck pain.  Skin: Negative.   Allergic/Immunologic: Negative.   Neurological: Negative.  Negative for dizziness, weakness and light-headedness.  Hematological: Negative for adenopathy. Does not bruise/bleed easily.  Psychiatric/Behavioral: Negative.     Objective:  BP 120/70 (BP Location: Left Arm, Patient Position: Sitting, Cuff Size: Large)   Pulse (!) 57   Temp 97.8 F (36.6 C) (Oral)   Ht 5\' 11"  (1.803 m)   Wt 244 lb (110.7 kg)   SpO2 98%   BMI 34.03 kg/m   BP Readings from Last 3 Encounters:  03/15/17 120/70  12/28/16 136/82  11/17/16 140/80    Wt Readings from Last 3 Encounters:  03/15/17 244 lb (110.7 kg)  12/28/16 239 lb 1.3 oz (108.4 kg)  11/12/16 244 lb 8 oz (110.9 kg)    Physical Exam  Constitutional: He is oriented to person, place, and time. No distress.  HENT:  Mouth/Throat: Oropharynx is clear and moist. No oropharyngeal exudate.  Eyes: Conjunctivae are normal. Right eye exhibits no discharge. Left eye exhibits no discharge. No scleral icterus.  Neck:  Normal range of motion. Neck supple. No JVD present. No tracheal deviation present. No thyromegaly present.  Cardiovascular: Normal rate, regular rhythm, S1 normal, S2 normal and intact distal pulses.  Exam reveals no gallop and no friction rub.   Murmur heard.  Systolic murmur is present with a grade of 2/6   No diastolic murmur is present  2/6 harsh SEM over RUSB  Pulmonary/Chest: Effort normal and breath sounds normal. No stridor. No respiratory distress. He has no wheezes. He has no  rales. He exhibits no tenderness.  Abdominal: Soft. Bowel sounds are normal. He exhibits no distension and no mass. There is no tenderness. There is no rebound and no guarding.  Musculoskeletal: Normal range of motion. He exhibits no edema, tenderness or deformity.  Lymphadenopathy:    He has no cervical adenopathy.  Neurological: He is oriented to person, place, and time.  Skin: Skin is warm and dry. No rash noted. He is not diaphoretic. No erythema. No pallor.  Vitals reviewed.   Lab Results  Component Value Date   WBC 7.7 03/15/2017   HGB 16.0 03/15/2017   HCT 47.4 03/15/2017   PLT 238.0 03/15/2017   GLUCOSE 161 (H) 03/15/2017   CHOL 172 03/15/2017   TRIG 90.0 03/15/2017   HDL 51.20 03/15/2017   LDLDIRECT 100.0 06/13/2009   LDLCALC 102 (H) 03/15/2017   ALT 21 03/15/2017   AST 19 03/15/2017   NA 137 03/15/2017   K 3.8 03/15/2017   CL 101 03/15/2017   CREATININE 1.41 03/15/2017   BUN 24 (H) 03/15/2017   CO2 29 03/15/2017   TSH 2.28 06/05/2016   PSA 1.05 10/28/2015   HGBA1C 6.9 (H) 03/15/2017   MICROALBUR 41.1 (H) 03/15/2017    Dg Chest 2 View  Result Date: 12/21/2016 CLINICAL DATA:  Cough and congestion for several days, initial encounter EXAM: CHEST  2 VIEW COMPARISON:  03/04/2011 FINDINGS: Cardiac shadow is stable. Tortuous aorta is again noted and stable. The lungs are well aerated bilaterally. No focal infiltrate is seen. Degenerative changes of thoracic spine are noted. IMPRESSION: No active cardiopulmonary disease. Electronically Signed   By: Inez Catalina M.D.   On: 12/21/2016 13:50    Assessment & Plan:   Gearold was seen today for hyperlipidemia, hypertension and diabetes.  Diagnoses and all orders for this visit:  Essential hypertension- his blood pressure is adequately well controlled, electrolytes and renal function are normal. -     Comprehensive metabolic panel; Future -     CBC with Differential/Platelet; Future -     Urinalysis, Routine w reflex  microscopic; Future  Type 2 diabetes mellitus with complication, with long-term current use of insulin ([place])- his A1c is down to 6.9%, his blood sugars are adequately well controlled. -     Hemoglobin A1c; Future -     Microalbumin / creatinine urine ratio; Future  Hyperlipidemia with target LDL less than 100- he is achieved his LDL goal is doing well on the statin. -     Lipid panel; Future   I have discontinued Mr. Hlavacek's HYDROcodone-homatropine. I am also having him maintain his aspirin, Cholecalciferol, glucose blood, Lancets, sitaGLIPtin, Insulin Glargine, colchicine, losartan, febuxostat, atenolol-chlorthalidone, pravastatin, and fluticasone furoate-vilanterol.  No orders of the defined types were placed in this encounter.    Follow-up: Return in about 6 months (around 09/14/2017).  [name], MD

## 2017-03-15 NOTE — Patient Instructions (Signed)

## 2017-03-16 ENCOUNTER — Telehealth: Payer: Self-pay

## 2017-03-16 NOTE — Telephone Encounter (Signed)
PA started via covermymeds  KEY: [place]

## 2017-03-25 NOTE — Telephone Encounter (Signed)
PA has been approved.   Can you call pt and inform of same? He will need to call his pharmacy if he needs any refills.  Let me know if you do not have time to contact him.

## 2017-03-26 NOTE — Telephone Encounter (Signed)
Patient has been informed.    Thank you.

## 2017-04-13 ENCOUNTER — Other Ambulatory Visit: Payer: Self-pay | Admitting: Internal Medicine

## 2017-05-05 DIAGNOSIS — L570 Actinic keratosis: Secondary | ICD-10-CM | POA: Diagnosis not present

## 2017-05-05 DIAGNOSIS — L821 Other seborrheic keratosis: Secondary | ICD-10-CM | POA: Diagnosis not present

## 2017-05-05 DIAGNOSIS — Z85828 Personal history of other malignant neoplasm of skin: Secondary | ICD-10-CM | POA: Diagnosis not present

## 2017-05-18 ENCOUNTER — Encounter: Payer: Self-pay | Admitting: Internal Medicine

## 2017-05-20 NOTE — Telephone Encounter (Signed)
Stef, Patient came by at lunch.  Gave patient both boxes.  Had jill verify medication behind me before I dispensed.

## 2017-05-28 ENCOUNTER — Encounter: Payer: Self-pay | Admitting: Internal Medicine

## 2017-05-30 ENCOUNTER — Other Ambulatory Visit: Payer: Self-pay | Admitting: Internal Medicine

## 2017-05-30 MED ORDER — ZOSTER VAC RECOMB ADJUVANTED 50 MCG/0.5ML IM SUSR: 0.5000 mL | Freq: Once | INTRAMUSCULAR | 1 refills | Status: AC

## 2017-06-10 ENCOUNTER — Other Ambulatory Visit: Payer: Self-pay | Admitting: Internal Medicine

## 2017-06-18 ENCOUNTER — Encounter: Payer: Self-pay | Admitting: Internal Medicine

## 2017-06-18 DIAGNOSIS — E118 Type 2 diabetes mellitus with unspecified complications: Secondary | ICD-10-CM

## 2017-06-18 MED ORDER — INSULIN GLARGINE 300 UNIT/ML ~~LOC~~ SOPN: 30.0000 [IU] | PEN_INJECTOR | Freq: Every day | SUBCUTANEOUS | 5 refills | Status: DC

## 2017-06-24 LAB — HM DIABETES EYE EXAM

## 2017-08-18 DIAGNOSIS — H2513 Age-related nuclear cataract, bilateral: Secondary | ICD-10-CM | POA: Diagnosis not present

## 2017-08-18 DIAGNOSIS — H3530 Unspecified macular degeneration: Secondary | ICD-10-CM | POA: Diagnosis not present

## 2017-08-18 DIAGNOSIS — H1851 Endothelial corneal dystrophy: Secondary | ICD-10-CM | POA: Diagnosis not present

## 2017-08-20 ENCOUNTER — Emergency Department (HOSPITAL_COMMUNITY): Payer: Medicare Other

## 2017-08-20 ENCOUNTER — Encounter (HOSPITAL_COMMUNITY): Payer: Self-pay

## 2017-08-20 ENCOUNTER — Emergency Department (HOSPITAL_COMMUNITY)
Admission: EM | Admit: 2017-08-20 | Discharge: 2017-08-21 | Disposition: A | Discharge status: Home / Self Care | Payer: Medicare Other | Attending: Emergency Medicine | Admitting: Emergency Medicine

## 2017-08-20 DIAGNOSIS — Z79899 Other long term (current) drug therapy: Secondary | ICD-10-CM | POA: Insufficient documentation

## 2017-08-20 DIAGNOSIS — Z87891 Personal history of nicotine dependence: Secondary | ICD-10-CM | POA: Insufficient documentation

## 2017-08-20 DIAGNOSIS — Y999 Unspecified external cause status: Secondary | ICD-10-CM | POA: Diagnosis not present

## 2017-08-20 DIAGNOSIS — R51 Headache: Secondary | ICD-10-CM | POA: Insufficient documentation

## 2017-08-20 DIAGNOSIS — Y929 Unspecified place or not applicable: Secondary | ICD-10-CM | POA: Insufficient documentation

## 2017-08-20 DIAGNOSIS — E785 Hyperlipidemia, unspecified: Secondary | ICD-10-CM | POA: Diagnosis not present

## 2017-08-20 DIAGNOSIS — W19XXXA Unspecified fall, initial encounter: Secondary | ICD-10-CM

## 2017-08-20 DIAGNOSIS — Y9389 Activity, other specified: Secondary | ICD-10-CM | POA: Insufficient documentation

## 2017-08-20 DIAGNOSIS — X58XXXA Exposure to other specified factors, initial encounter: Secondary | ICD-10-CM | POA: Insufficient documentation

## 2017-08-20 DIAGNOSIS — M545 Low back pain: Secondary | ICD-10-CM | POA: Diagnosis not present

## 2017-08-20 DIAGNOSIS — E119 Type 2 diabetes mellitus without complications: Secondary | ICD-10-CM | POA: Diagnosis not present

## 2017-08-20 DIAGNOSIS — R9431 Abnormal electrocardiogram [ECG] [EKG]: Secondary | ICD-10-CM | POA: Diagnosis not present

## 2017-08-20 DIAGNOSIS — Z7982 Long term (current) use of aspirin: Secondary | ICD-10-CM | POA: Diagnosis not present

## 2017-08-20 DIAGNOSIS — S299XXA Unspecified injury of thorax, initial encounter: Secondary | ICD-10-CM | POA: Diagnosis not present

## 2017-08-20 DIAGNOSIS — M5489 Other dorsalgia: Secondary | ICD-10-CM | POA: Diagnosis not present

## 2017-08-20 DIAGNOSIS — I129 Hypertensive chronic kidney disease with stage 1 through stage 4 chronic kidney disease, or unspecified chronic kidney disease: Secondary | ICD-10-CM | POA: Diagnosis not present

## 2017-08-20 DIAGNOSIS — Z7984 Long term (current) use of oral hypoglycemic drugs: Secondary | ICD-10-CM | POA: Insufficient documentation

## 2017-08-20 DIAGNOSIS — N183 Chronic kidney disease, stage 3 (moderate): Secondary | ICD-10-CM | POA: Insufficient documentation

## 2017-08-20 DIAGNOSIS — S32020A Wedge compression fracture of second lumbar vertebra, initial encounter for closed fracture: Secondary | ICD-10-CM | POA: Diagnosis not present

## 2017-08-20 DIAGNOSIS — S3992XA Unspecified injury of lower back, initial encounter: Secondary | ICD-10-CM | POA: Diagnosis present

## 2017-08-20 DIAGNOSIS — T148XXA Other injury of unspecified body region, initial encounter: Secondary | ICD-10-CM | POA: Diagnosis not present

## 2017-08-20 LAB — URINALYSIS, ROUTINE W REFLEX MICROSCOPIC
Bilirubin Urine: NEGATIVE
GLUCOSE, UA: NEGATIVE mg/dL
HGB URINE DIPSTICK: NEGATIVE
Ketones, ur: NEGATIVE mg/dL
LEUKOCYTES UA: NEGATIVE
NITRITE: NEGATIVE
PROTEIN: 100 mg/dL — AB
Specific Gravity, Urine: 1.017 (ref 1.005–1.030)
Squamous Epithelial / LPF: NONE SEEN
pH: 5 (ref 5.0–8.0)

## 2017-08-20 LAB — CBC WITH DIFFERENTIAL/PLATELET
BASOS ABS: 0 10*3/uL (ref 0.0–0.1)
BASOS PCT: 0 %
Eosinophils Absolute: 0 10*3/uL (ref 0.0–0.7)
Eosinophils Relative: 0 %
HEMATOCRIT: 44.6 % (ref 39.0–52.0)
HEMOGLOBIN: 14.4 g/dL (ref 13.0–17.0)
Lymphocytes Relative: 8 %
Lymphs Abs: 1 10*3/uL (ref 0.7–4.0)
MCH: 30.5 pg (ref 26.0–34.0)
MCHC: 32.3 g/dL (ref 30.0–36.0)
MCV: 94.5 fL (ref 78.0–100.0)
MONO ABS: 0.5 10*3/uL (ref 0.1–1.0)
Monocytes Relative: 4 %
NEUTROS ABS: 10.8 10*3/uL — AB (ref 1.7–7.7)
NEUTROS PCT: 88 %
Platelets: 201 10*3/uL (ref 150–400)
RBC: 4.72 MIL/uL (ref 4.22–5.81)
RDW: 13.2 % (ref 11.5–15.5)
WBC: 12.4 10*3/uL — AB (ref 4.0–10.5)

## 2017-08-20 LAB — ETHANOL: Alcohol, Ethyl (B): 124 mg/dL — ABNORMAL HIGH (ref <5)

## 2017-08-20 MED ORDER — SODIUM CHLORIDE 0.9 % IV BOLUS (SEPSIS)
1000.0000 mL | Freq: Once | INTRAVENOUS | Status: AC
  Administered 2017-08-20: 1000 mL via INTRAVENOUS

## 2017-08-20 MED ORDER — MORPHINE SULFATE (PF) 4 MG/ML IV SOLN
4.0000 mg | Freq: Once | INTRAVENOUS | Status: AC
  Administered 2017-08-20: 4 mg via INTRAVENOUS
  Filled 2017-08-20: qty 1

## 2017-08-20 MED ORDER — ONDANSETRON HCL 4 MG/2ML IJ SOLN
4.0000 mg | Freq: Once | INTRAMUSCULAR | Status: AC
  Administered 2017-08-20: 4 mg via INTRAVENOUS
  Filled 2017-08-20: qty 2

## 2017-08-20 NOTE — ED Provider Notes (Signed)
[facility] DEPT Provider Note   CSN: [id number] Arrival date & time: 08/20/17  2108     History   Chief Complaint Chief Complaint  Patient presents with  . Fall    HPI Ryan Duke is a 76 y.o. male.  Pt presents to the ED today with a fall.  The pt said he drank a lot of scotch.  He walked outside and fell.  He was unable to get up.  The pt said his wife called a friend who helped him get to a chair.  He was unable to get up.  The pt said he has pain in his low back.  The pt denies hitting his head or losing consciousness.  He is not on blood thinners.      Past Medical History:  Diagnosis Date  . Diabetes mellitus    type 2  . Hyperlipidemia   . Hypertension   . Hypogonadism, male   . Pancreatitis 2008  . Renal insufficiency     Patient Active Problem List   Diagnosis Date Noted  . Mild intermittent asthma without complication 96/22/2979  . Gout 11/17/2016  . Alcoholic hepatitis without ascites 11/12/2016  . Primary gout 07/07/2016  . Depression, acute 09/15/2015  . Erectile dysfunction 03/06/2014  . Routine general medical examination at a health care facility 09/18/2011  . GERD 07/22/2010  . BPH (benign prostatic hyperplasia) 09/02/2009  . Kidney disease, chronic, 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) 02/26/2009  . Aortic valve disorder 02/25/2009  . PERIPHERAL VASCULAR DISEASE 02/25/2009  . Type II diabetes mellitus with manifestations ([place]) 11/01/2007  . Hyperlipidemia with target LDL less than 100 07/01/2007  . Obesity 07/01/2007  . Essential hypertension 07/01/2007    History reviewed. No pertinent surgical history.     Home Medications    Prior to Admission medications   Medication Sig Start Date End Date Taking? Authorizing Provider  aspirin 81 MG chewable tablet Chew 81 mg by mouth daily.     Yes [provider]  atenolol-chlorthalidone (TENORETIC) 100-25 MG tablet TAKE 1 TABLET ONCE DAILY. 06/10/17  Yes Janith Lima, MD  Insulin  Glargine (TOUJEO SOLOSTAR) 300 UNIT/ML SOPN Inject 30 Units into the skin daily. Patient taking differently: Inject 30 Units into the skin at bedtime.  06/18/17  Yes Janith Lima, MD  losartan (COZAAR) 100 MG tablet TAKE 1 TABLET DAILY FOR HYPERTENSION. 09/08/16  Yes Janith Lima, MD  pravastatin (PRAVACHOL) 40 MG tablet TAKE 1 TABLET ONCE DAILY. 12/07/16  Yes Janith Lima, MD  sitaGLIPtin (JANUVIA) 100 MG tablet Take 1 tablet (100 mg total) by mouth daily. 10/18/13  Yes Janith Lima, MD  colchicine (COLCRYS) 0.6 MG tablet Take 1 tablet (0.6 mg total) by mouth 2 (two) times daily. Patient not taking: Reported on 08/20/2017 08/23/16   Janith Lima, MD  febuxostat (ULORIC) 40 MG tablet Take 1 tablet (40 mg total) by mouth daily. Patient not taking: Reported on 08/20/2017 11/12/16   Janith Lima, MD  fluticasone furoate-vilanterol (BREO ELLIPTA) 100-25 MCG/INH AEPB Inhale 1 puff into the lungs daily. Patient not taking: Reported on 08/20/2017 12/28/16   Janith Lima, MD  glucose blood (BAYER CONTOUR TEST) test strip 1 each by Other route 2 (two) times daily. Use as instructed     [provider]  Lancets MISC Test blood sugar twice daily as directed. Dx:250.02 10/04/12   Janith Lima, MD    Family History Family History  Problem Relation Age  of Onset  . Hypertension Other     Social History Social History  Substance Use Topics  . Smoking status: Former Smoker    Quit date: 11/30/1974  . Smokeless tobacco: Never Used  . Alcohol use 12.6 oz/week    21 Shots of liquor per week     Allergies   Farxiga [dapagliflozin]; Metformin and related; Atorvastatin; and Benazepril hcl   Review of Systems Review of Systems  Musculoskeletal: Positive for back pain.  Neurological: Positive for light-headedness.  All other systems reviewed and are negative.    Physical Exam Updated Vital Signs BP 137/76   Pulse 60   Temp 97.8 F (36.6 C)   Resp (!) 22   Ht 5\' 11"   (1.803 m)   Wt 113.4 kg (250 lb)   SpO2 92%   BMI 34.87 kg/m   Physical Exam  Constitutional: He is oriented to person, place, and time. He appears well-developed and well-nourished.  HENT:  Head: Normocephalic and atraumatic.  Right Ear: External ear normal.  Left Ear: External ear normal.  Nose: Nose normal.  Mouth/Throat: Oropharynx is clear and moist.  Eyes: Pupils are equal, round, and reactive to light. Conjunctivae and EOM are normal.  Neck: Normal range of motion. Neck supple.  Cardiovascular: Normal rate, regular rhythm, normal heart sounds and intact distal pulses.   Pulmonary/Chest: Effort normal and breath sounds normal.  Abdominal: Soft. Bowel sounds are normal.  Musculoskeletal:       Lumbar back: He exhibits tenderness.  Neurological: He is alert and oriented to person, place, and time.  Skin: Skin is warm.  Psychiatric: He has a normal mood and affect. His behavior is normal. Judgment and thought content normal.  Nursing note and vitals reviewed.    ED Treatments / Results  Labs (all labs ordered are listed, but only abnormal results are displayed) Labs Reviewed  CBC WITH DIFFERENTIAL/PLATELET - Abnormal; Notable for the following:       Result Value   WBC 12.4 (*)    Neutro Abs 10.8 (*)    All other components within normal limits  URINALYSIS, ROUTINE W REFLEX MICROSCOPIC - Abnormal; Notable for the following:    Protein, ur 100 (*)    Bacteria, UA FEW (*)    All other components within normal limits  COMPREHENSIVE METABOLIC PANEL  TROPONIN I  ETHANOL    EKG  EKG Interpretation  Date/Time:  Friday August 20 2017 22:43:45 EDT Ventricular Rate:  59 PR Interval:    QRS Duration: 103 QT Interval:  462 QTC Calculation: 458 R Axis:   -33 Text Interpretation:  Sinus rhythm Left axis deviation Abnormal T, consider ischemia, lateral leads No old tracing to compare Reconfirmed by Isla Pence [phone]) on 08/20/2017 10:48:37 PM        Radiology Dg Chest 2 View  Result Date: 08/20/2017 CLINICAL DATA:  Back pain after a fall tonight. History of hypertension, diabetes, former smoker. EXAM: CHEST  2 VIEW COMPARISON:  12/21/2013 FINDINGS: Cardiac enlargement. Pulmonary vascularity is normal. No airspace disease or consolidation in the lungs. Prominent apical cardiac fat pad. No pleural effusions. No pneumothorax. Mediastinal contours appear intact. Degenerative changes in the spine. IMPRESSION: Cardiac enlargement.  No evidence of active pulmonary disease. Electronically Signed   By: Lucienne Capers M.D.   On: 08/20/2017 22:20   Dg Lumbar Spine Complete  Result Date: 08/20/2017 CLINICAL DATA:  Low back pain radiating down to the legs after a fall. History of hypertension and diabetes. Former smoker.  EXAM: LUMBAR SPINE - COMPLETE 4+ VIEW COMPARISON:  None. FINDINGS: There is acute appearing compression deformity of the L2 vertebra with about 30% loss of height. Cortical irregularities are demonstrated in the anterior vertebral margin and along the superior endplate. No retropulsion of fracture fragments. Normal alignment of the lumbar spine. Diffuse degenerative changes with narrowed interspaces and endplate hypertrophic changes throughout. Degenerative changes in the lumbar facet joints. No destructive or expansile bone lesions. Aortic calcification. IMPRESSION: Acute appearing compression fracture of the L2 vertebra with about 30% loss of height. No retropulsion of fracture fragments. Normal alignment. Electronically Signed   By: Lucienne Capers M.D.   On: 08/20/2017 22:20   Ct Head Wo Contrast  Result Date: 08/20/2017 CLINICAL DATA:  Patient fell outside of home. No loss of consciousness. Posttraumatic headache. Drinking scotch. EXAM: CT HEAD WITHOUT CONTRAST TECHNIQUE: Contiguous axial images were obtained from the base of the skull through the vertex without intravenous contrast. COMPARISON:  MRI brain 06/17/2013.  CT head  02/20/2009 FINDINGS: Brain: Mild diffuse cerebral atrophy. Mild ventricular dilatation consistent with central atrophy. Patchy low-attenuation changes in the deep white matter likely representing small vessel ischemia. No mass effect or midline shift. No abnormal extra-axial fluid collections. Gray-white matter junctions are distinct. Basal cisterns are not effaced. Mild prominence of cisterna magna. No acute intracranial hemorrhage. Vascular: Vascular calcifications demonstrated in the cervical carotid and vertebrobasilar arteries. Skull: The calvarium appears intact. Sinuses/Orbits: Mucosal thickening in the paranasal sinuses. No acute air-fluid levels. Mastoid air cells are not opacified. Other: None. IMPRESSION: No acute intracranial abnormalities. Mild chronic atrophy and small vessel ischemic changes. Electronically Signed   By: Lucienne Capers M.D.   On: 08/20/2017 22:31    Procedures Procedures (including critical care time)  Medications Ordered in ED Medications  sodium chloride 0.9 % bolus 1,000 mL (1,000 mLs Intravenous New Bag/Given 08/20/17 2234)  morphine 4 MG/ML injection 4 mg (4 mg Intravenous Given 08/20/17 2236)  ondansetron (ZOFRAN) injection 4 mg (4 mg Intravenous Given 08/20/17 2236)     Initial Impression / Assessment and Plan / ED Course  I have reviewed the triage vital signs and the nursing notes.  Pertinent labs & imaging results that were available during my care of the patient were reviewed by me and considered in my medical decision making (see chart for details).  Labs pending at shift change.  TLSO brace ordered for patient.  If pt can walk with brace, anticipate d/c.  Pt to be signed out to Dr. Leonides Schanz.     Final Clinical Impressions(s) / ED Diagnoses   Final diagnoses:  Closed compression fracture of second lumbar vertebra, initial encounter [facility])    New Prescriptions New Prescriptions   No medications on file     Isla Pence, MD 08/20/17 2340

## 2017-08-20 NOTE — ED Triage Notes (Signed)
Pt. Reports falling outside of his home. No LOC. Pt. States he was not able to get up. Pt. States it was due to drinking scotch, approximately 2 glasses. Pt has abrasions to both elbows and reports lower back pain.

## 2017-08-20 NOTE — ED Notes (Signed)
[employer] at bedside 

## 2017-08-20 NOTE — ED Notes (Signed)
RN called [employer] for TLSO brace

## 2017-08-20 NOTE — ED Notes (Signed)
Pt. To XRAY via stretcher. 

## 2017-08-21 DIAGNOSIS — Z87891 Personal history of nicotine dependence: Secondary | ICD-10-CM | POA: Diagnosis not present

## 2017-08-21 DIAGNOSIS — N183 Chronic kidney disease, stage 3 (moderate): Secondary | ICD-10-CM | POA: Diagnosis not present

## 2017-08-21 DIAGNOSIS — Z7984 Long term (current) use of oral hypoglycemic drugs: Secondary | ICD-10-CM | POA: Diagnosis not present

## 2017-08-21 DIAGNOSIS — Z79899 Other long term (current) drug therapy: Secondary | ICD-10-CM | POA: Diagnosis not present

## 2017-08-21 DIAGNOSIS — Z7982 Long term (current) use of aspirin: Secondary | ICD-10-CM | POA: Diagnosis not present

## 2017-08-21 DIAGNOSIS — E119 Type 2 diabetes mellitus without complications: Secondary | ICD-10-CM | POA: Diagnosis not present

## 2017-08-21 DIAGNOSIS — S32020A Wedge compression fracture of second lumbar vertebra, initial encounter for closed fracture: Secondary | ICD-10-CM | POA: Diagnosis not present

## 2017-08-21 DIAGNOSIS — E785 Hyperlipidemia, unspecified: Secondary | ICD-10-CM | POA: Diagnosis not present

## 2017-08-21 DIAGNOSIS — I129 Hypertensive chronic kidney disease with stage 1 through stage 4 chronic kidney disease, or unspecified chronic kidney disease: Secondary | ICD-10-CM | POA: Diagnosis not present

## 2017-08-21 DIAGNOSIS — R51 Headache: Secondary | ICD-10-CM | POA: Diagnosis not present

## 2017-08-21 LAB — COMPREHENSIVE METABOLIC PANEL
ALBUMIN: 3.8 g/dL (ref 3.5–5.0)
ALK PHOS: 49 U/L (ref 38–126)
ALT: 24 U/L (ref 17–63)
ANION GAP: 13 (ref 5–15)
AST: 26 U/L (ref 15–41)
BUN: 19 mg/dL (ref 6–20)
CO2: 22 mmol/L (ref 22–32)
Calcium: 8.9 mg/dL (ref 8.9–10.3)
Chloride: 103 mmol/L (ref 101–111)
Creatinine, Ser: 1.47 mg/dL — ABNORMAL HIGH (ref 0.61–1.24)
GFR calc Af Amer: 51 mL/min — ABNORMAL LOW (ref >60)
GFR calc non Af Amer: 44 mL/min — ABNORMAL LOW (ref >60)
GLUCOSE: 128 mg/dL — AB (ref 65–99)
POTASSIUM: 3 mmol/L — AB (ref 3.5–5.1)
SODIUM: 138 mmol/L (ref 135–145)
Total Bilirubin: 0.8 mg/dL (ref 0.3–1.2)
Total Protein: 6.4 g/dL — ABNORMAL LOW (ref 6.5–8.1)

## 2017-08-21 LAB — TROPONIN I: Troponin I: <0.03 ng/mL (ref <0.03)

## 2017-08-21 MED ORDER — HYDROCODONE-ACETAMINOPHEN 5-325 MG PO TABS: 1.0000 | ORAL_TABLET | ORAL | 0 refills | Status: DC | PRN

## 2017-08-21 MED ORDER — HYDROMORPHONE HCL 1 MG/ML IJ SOLN
0.5000 mg | Freq: Once | INTRAMUSCULAR | Status: AC
  Administered 2017-08-21: 0.5 mg via INTRAVENOUS
  Filled 2017-08-21: qty 1

## 2017-08-21 MED ORDER — HYDROCODONE-ACETAMINOPHEN 5-325 MG PO TABS: 1.0000 | ORAL_TABLET | Freq: Four times a day (QID) | ORAL | 0 refills | Status: DC | PRN

## 2017-08-21 MED ORDER — ONDANSETRON HCL 4 MG/2ML IJ SOLN
4.0000 mg | Freq: Once | INTRAMUSCULAR | Status: AC
  Administered 2017-08-21: 4 mg via INTRAVENOUS
  Filled 2017-08-21: qty 2

## 2017-08-21 MED ORDER — HYDROCODONE-ACETAMINOPHEN 5-325 MG PO TABS
2.0000 | ORAL_TABLET | Freq: Once | ORAL | Status: AC
  Administered 2017-08-21: 2 via ORAL
  Filled 2017-08-21: qty 2

## 2017-08-21 MED ORDER — ONDANSETRON 4 MG PO TBDP: 4.0000 mg | ORAL_TABLET | Freq: Three times a day (TID) | ORAL | 0 refills | Status: DC | PRN

## 2017-08-21 NOTE — ED Notes (Signed)
Pt unsteady on feet states feeling weak.

## 2017-08-21 NOTE — ED Notes (Addendum)
Pt. Ambulated and states having nausea and pain 10/10. MD will be notified once available. Will continue to monitor.

## 2017-08-21 NOTE — ED Provider Notes (Signed)
2:00 AM  Assumed care from Dr. Gilford Raid.  Pt is a 76 y.o. male who had mechanical fall. Found to have a L2 compression fracture with 30% loss of height with no retropulsion. Neurologically intact. Other imaging, labs and urine unremarkable. Plan was to place TLSO brace and ambulate patient.  Patient able to ambulate but still having pain. Given Dilaudid and Zofran and reports feeling better and now feels better ambulating. Still neurologically intact. We'll discharge home with Vicodin, Zofran. Given outpatient follow-up. Discussed return precautions. Wife is here to drive him home.   Jamesia Linnen, Delice Bison, DO 08/21/17 [phone]

## 2017-08-23 ENCOUNTER — Encounter: Payer: Self-pay | Admitting: Internal Medicine

## 2017-08-23 NOTE — Telephone Encounter (Signed)
Pt stated that he did not want to see anyone else and that he will wait until Dr. Ronnald Ramp comes back from vacation.

## 2017-08-28 ENCOUNTER — Other Ambulatory Visit: Payer: Self-pay | Admitting: Internal Medicine

## 2017-09-02 ENCOUNTER — Other Ambulatory Visit: Payer: Self-pay | Admitting: Internal Medicine

## 2017-09-02 DIAGNOSIS — I10 Essential (primary) hypertension: Secondary | ICD-10-CM | POA: Diagnosis not present

## 2017-09-02 DIAGNOSIS — S32020D Wedge compression fracture of second lumbar vertebra, subsequent encounter for fracture with routine healing: Secondary | ICD-10-CM | POA: Diagnosis not present

## 2017-09-02 DIAGNOSIS — Z6831 Body mass index (BMI) 31.0-31.9, adult: Secondary | ICD-10-CM | POA: Diagnosis not present

## 2017-09-05 ENCOUNTER — Inpatient Hospital Stay (HOSPITAL_COMMUNITY)
Admission: EM | Admit: 2017-09-05 | Discharge: 2017-09-16 | DRG: 515 | Disposition: A | Discharge status: Skilled Nursing Facility | Payer: Medicare Other | Attending: Internal Medicine | Admitting: Internal Medicine

## 2017-09-05 ENCOUNTER — Emergency Department (HOSPITAL_COMMUNITY): Payer: Medicare Other

## 2017-09-05 ENCOUNTER — Encounter (HOSPITAL_COMMUNITY): Payer: Self-pay

## 2017-09-05 DIAGNOSIS — E291 Testicular hypofunction: Secondary | ICD-10-CM | POA: Diagnosis not present

## 2017-09-05 DIAGNOSIS — M4856XA Collapsed vertebra, not elsewhere classified, lumbar region, initial encounter for fracture: Secondary | ICD-10-CM | POA: Diagnosis not present

## 2017-09-05 DIAGNOSIS — Y92009 Unspecified place in unspecified non-institutional (private) residence as the place of occurrence of the external cause: Secondary | ICD-10-CM | POA: Diagnosis not present

## 2017-09-05 DIAGNOSIS — K626 Ulcer of anus and rectum: Secondary | ICD-10-CM | POA: Diagnosis not present

## 2017-09-05 DIAGNOSIS — G9349 Other encephalopathy: Secondary | ICD-10-CM | POA: Diagnosis not present

## 2017-09-05 DIAGNOSIS — I129 Hypertensive chronic kidney disease with stage 1 through stage 4 chronic kidney disease, or unspecified chronic kidney disease: Secondary | ICD-10-CM | POA: Diagnosis not present

## 2017-09-05 DIAGNOSIS — E1121 Type 2 diabetes mellitus with diabetic nephropathy: Secondary | ICD-10-CM | POA: Diagnosis not present

## 2017-09-05 DIAGNOSIS — R0682 Tachypnea, not elsewhere classified: Secondary | ICD-10-CM | POA: Diagnosis not present

## 2017-09-05 DIAGNOSIS — Z7984 Long term (current) use of oral hypoglycemic drugs: Secondary | ICD-10-CM

## 2017-09-05 DIAGNOSIS — S32020G Wedge compression fracture of second lumbar vertebra, subsequent encounter for fracture with delayed healing: Secondary | ICD-10-CM

## 2017-09-05 DIAGNOSIS — M545 Low back pain: Secondary | ICD-10-CM | POA: Diagnosis not present

## 2017-09-05 DIAGNOSIS — K921 Melena: Secondary | ICD-10-CM

## 2017-09-05 DIAGNOSIS — R4181 Age-related cognitive decline: Secondary | ICD-10-CM | POA: Diagnosis not present

## 2017-09-05 DIAGNOSIS — D62 Acute posthemorrhagic anemia: Secondary | ICD-10-CM | POA: Diagnosis not present

## 2017-09-05 DIAGNOSIS — Z888 Allergy status to other drugs, medicaments and biological substances status: Secondary | ICD-10-CM | POA: Diagnosis not present

## 2017-09-05 DIAGNOSIS — G9341 Metabolic encephalopathy: Secondary | ICD-10-CM | POA: Diagnosis present

## 2017-09-05 DIAGNOSIS — Z79899 Other long term (current) drug therapy: Secondary | ICD-10-CM | POA: Diagnosis not present

## 2017-09-05 DIAGNOSIS — E876 Hypokalemia: Secondary | ICD-10-CM | POA: Diagnosis not present

## 2017-09-05 DIAGNOSIS — K648 Other hemorrhoids: Secondary | ICD-10-CM | POA: Diagnosis present

## 2017-09-05 DIAGNOSIS — M549 Dorsalgia, unspecified: Secondary | ICD-10-CM

## 2017-09-05 DIAGNOSIS — N183 Chronic kidney disease, stage 3 (moderate): Secondary | ICD-10-CM | POA: Diagnosis not present

## 2017-09-05 DIAGNOSIS — R338 Other retention of urine: Secondary | ICD-10-CM | POA: Diagnosis not present

## 2017-09-05 DIAGNOSIS — G8929 Other chronic pain: Secondary | ICD-10-CM | POA: Diagnosis present

## 2017-09-05 DIAGNOSIS — K625 Hemorrhage of anus and rectum: Secondary | ICD-10-CM | POA: Diagnosis not present

## 2017-09-05 DIAGNOSIS — E785 Hyperlipidemia, unspecified: Secondary | ICD-10-CM | POA: Diagnosis present

## 2017-09-05 DIAGNOSIS — M109 Gout, unspecified: Secondary | ICD-10-CM | POA: Diagnosis present

## 2017-09-05 DIAGNOSIS — S32010A Wedge compression fracture of first lumbar vertebra, initial encounter for closed fracture: Secondary | ICD-10-CM | POA: Diagnosis not present

## 2017-09-05 DIAGNOSIS — K5900 Constipation, unspecified: Secondary | ICD-10-CM

## 2017-09-05 DIAGNOSIS — N401 Enlarged prostate with lower urinary tract symptoms: Secondary | ICD-10-CM | POA: Diagnosis present

## 2017-09-05 DIAGNOSIS — M6281 Muscle weakness (generalized): Secondary | ICD-10-CM | POA: Diagnosis not present

## 2017-09-05 DIAGNOSIS — R0602 Shortness of breath: Secondary | ICD-10-CM | POA: Diagnosis not present

## 2017-09-05 DIAGNOSIS — M5440 Lumbago with sciatica, unspecified side: Secondary | ICD-10-CM | POA: Diagnosis not present

## 2017-09-05 DIAGNOSIS — S3992XA Unspecified injury of lower back, initial encounter: Secondary | ICD-10-CM | POA: Diagnosis not present

## 2017-09-05 DIAGNOSIS — E1122 Type 2 diabetes mellitus with diabetic chronic kidney disease: Secondary | ICD-10-CM | POA: Diagnosis present

## 2017-09-05 DIAGNOSIS — E119 Type 2 diabetes mellitus without complications: Secondary | ICD-10-CM | POA: Diagnosis not present

## 2017-09-05 DIAGNOSIS — Z881 Allergy status to other antibiotic agents status: Secondary | ICD-10-CM

## 2017-09-05 DIAGNOSIS — R2681 Unsteadiness on feet: Secondary | ICD-10-CM | POA: Diagnosis not present

## 2017-09-05 DIAGNOSIS — N184 Chronic kidney disease, stage 4 (severe): Secondary | ICD-10-CM | POA: Diagnosis not present

## 2017-09-05 DIAGNOSIS — R52 Pain, unspecified: Secondary | ICD-10-CM

## 2017-09-05 DIAGNOSIS — M79606 Pain in leg, unspecified: Secondary | ICD-10-CM | POA: Diagnosis not present

## 2017-09-05 DIAGNOSIS — S32020K Wedge compression fracture of second lumbar vertebra, subsequent encounter for fracture with nonunion: Secondary | ICD-10-CM | POA: Diagnosis not present

## 2017-09-05 DIAGNOSIS — Z23 Encounter for immunization: Secondary | ICD-10-CM | POA: Diagnosis not present

## 2017-09-05 DIAGNOSIS — S32000S Wedge compression fracture of unspecified lumbar vertebra, sequela: Secondary | ICD-10-CM | POA: Diagnosis not present

## 2017-09-05 DIAGNOSIS — S32021A Stable burst fracture of second lumbar vertebra, initial encounter for closed fracture: Principal | ICD-10-CM | POA: Diagnosis present

## 2017-09-05 DIAGNOSIS — Z4789 Encounter for other orthopedic aftercare: Secondary | ICD-10-CM | POA: Diagnosis not present

## 2017-09-05 DIAGNOSIS — I10 Essential (primary) hypertension: Secondary | ICD-10-CM | POA: Diagnosis not present

## 2017-09-05 DIAGNOSIS — E1151 Type 2 diabetes mellitus with diabetic peripheral angiopathy without gangrene: Secondary | ICD-10-CM | POA: Diagnosis present

## 2017-09-05 DIAGNOSIS — S32020A Wedge compression fracture of second lumbar vertebra, initial encounter for closed fracture: Secondary | ICD-10-CM

## 2017-09-05 DIAGNOSIS — W19XXXA Unspecified fall, initial encounter: Secondary | ICD-10-CM | POA: Diagnosis present

## 2017-09-05 DIAGNOSIS — Z87891 Personal history of nicotine dependence: Secondary | ICD-10-CM

## 2017-09-05 DIAGNOSIS — K922 Gastrointestinal hemorrhage, unspecified: Secondary | ICD-10-CM | POA: Diagnosis not present

## 2017-09-05 LAB — URINALYSIS, ROUTINE W REFLEX MICROSCOPIC
BILIRUBIN URINE: NEGATIVE
GLUCOSE, UA: NEGATIVE mg/dL
Hgb urine dipstick: NEGATIVE
KETONES UR: NEGATIVE mg/dL
LEUKOCYTES UA: NEGATIVE
Nitrite: NEGATIVE
PH: 5 (ref 5.0–8.0)
Protein, ur: 100 mg/dL — AB
SPECIFIC GRAVITY, URINE: 1.03 (ref 1.005–1.030)

## 2017-09-05 LAB — CBC WITH DIFFERENTIAL/PLATELET
Basophils Absolute: 0 10*3/uL (ref 0.0–0.1)
Basophils Relative: 0 %
EOS ABS: 0.1 10*3/uL (ref 0.0–0.7)
EOS PCT: 1 %
HCT: 47.6 % (ref 39.0–52.0)
Hemoglobin: 16.6 g/dL (ref 13.0–17.0)
LYMPHS ABS: 1.1 10*3/uL (ref 0.7–4.0)
LYMPHS PCT: 10 %
MCH: 32.4 pg (ref 26.0–34.0)
MCHC: 34.9 g/dL (ref 30.0–36.0)
MCV: 92.8 fL (ref 78.0–100.0)
MONO ABS: 0.8 10*3/uL (ref 0.1–1.0)
Monocytes Relative: 7 %
Neutro Abs: 9 10*3/uL — ABNORMAL HIGH (ref 1.7–7.7)
Neutrophils Relative %: 82 %
PLATELETS: 332 10*3/uL (ref 150–400)
RBC: 5.13 MIL/uL (ref 4.22–5.81)
RDW: 12.6 % (ref 11.5–15.5)
WBC: 11 10*3/uL — AB (ref 4.0–10.5)

## 2017-09-05 LAB — COMPREHENSIVE METABOLIC PANEL
ALT: 23 U/L (ref 17–63)
AST: 25 U/L (ref 15–41)
Albumin: 3.9 g/dL (ref 3.5–5.0)
Alkaline Phosphatase: 118 U/L (ref 38–126)
Anion gap: 13 (ref 5–15)
BUN: 31 mg/dL — ABNORMAL HIGH (ref 6–20)
CHLORIDE: 97 mmol/L — AB (ref 101–111)
CO2: 28 mmol/L (ref 22–32)
CREATININE: 1.58 mg/dL — AB (ref 0.61–1.24)
Calcium: 9.3 mg/dL (ref 8.9–10.3)
GFR, EST AFRICAN AMERICAN: 47 mL/min — AB (ref >60)
GFR, EST NON AFRICAN AMERICAN: 40 mL/min — AB (ref >60)
Glucose, Bld: 182 mg/dL — ABNORMAL HIGH (ref 65–99)
POTASSIUM: 2.7 mmol/L — AB (ref 3.5–5.1)
SODIUM: 138 mmol/L (ref 135–145)
Total Bilirubin: 0.8 mg/dL (ref 0.3–1.2)
Total Protein: 7.1 g/dL (ref 6.5–8.1)

## 2017-09-05 LAB — GLUCOSE, CAPILLARY: GLUCOSE-CAPILLARY: 207 mg/dL — AB (ref 65–99)

## 2017-09-05 MED ORDER — DICLOFENAC SODIUM 1 % TD GEL
2.0000 g | Freq: Four times a day (QID) | TRANSDERMAL | Status: DC
  Administered 2017-09-05 – 2017-09-16 (×22): 2 g via TOPICAL
  Filled 2017-09-05 (×3): qty 100

## 2017-09-05 MED ORDER — HEPARIN SODIUM (PORCINE) 5000 UNIT/ML IJ SOLN
5000.0000 [IU] | Freq: Three times a day (TID) | INTRAMUSCULAR | Status: DC
  Administered 2017-09-05 – 2017-09-08 (×8): 5000 [IU] via SUBCUTANEOUS
  Filled 2017-09-05 (×8): qty 1

## 2017-09-05 MED ORDER — PRAVASTATIN SODIUM 40 MG PO TABS
40.0000 mg | ORAL_TABLET | Freq: Every day | ORAL | Status: DC
  Administered 2017-09-05 – 2017-09-16 (×12): 40 mg via ORAL
  Filled 2017-09-05 (×6): qty 1
  Filled 2017-09-05: qty 2
  Filled 2017-09-05 (×3): qty 1
  Filled 2017-09-05 (×2): qty 2

## 2017-09-05 MED ORDER — LOSARTAN POTASSIUM 50 MG PO TABS
100.0000 mg | ORAL_TABLET | Freq: Every day | ORAL | Status: DC
  Administered 2017-09-05 – 2017-09-09 (×5): 100 mg via ORAL
  Filled 2017-09-05 (×6): qty 2

## 2017-09-05 MED ORDER — FENTANYL 50 MCG/HR TD PT72: 50.0000 ug | MEDICATED_PATCH | TRANSDERMAL | 0 refills | Status: DC

## 2017-09-05 MED ORDER — CHLORTHALIDONE 25 MG PO TABS
25.0000 mg | ORAL_TABLET | Freq: Every day | ORAL | Status: DC
  Administered 2017-09-06 – 2017-09-09 (×4): 25 mg via ORAL
  Filled 2017-09-05 (×5): qty 1

## 2017-09-05 MED ORDER — MAGNESIUM SULFATE IN D5W 1-5 GM/100ML-% IV SOLN
1.0000 g | Freq: Once | INTRAVENOUS | Status: AC
  Administered 2017-09-05: 1 g via INTRAVENOUS
  Filled 2017-09-05: qty 100

## 2017-09-05 MED ORDER — SODIUM CHLORIDE 0.9 % IV BOLUS (SEPSIS)
1000.0000 mL | Freq: Once | INTRAVENOUS | Status: AC
  Administered 2017-09-05: 1000 mL via INTRAVENOUS

## 2017-09-05 MED ORDER — MORPHINE SULFATE (PF) 4 MG/ML IV SOLN
1.0000 mg | INTRAVENOUS | Status: DC | PRN
  Administered 2017-09-05 – 2017-09-11 (×4): 1 mg via INTRAVENOUS
  Filled 2017-09-05 (×4): qty 1

## 2017-09-05 MED ORDER — ONDANSETRON HCL 4 MG/2ML IJ SOLN
4.0000 mg | Freq: Four times a day (QID) | INTRAMUSCULAR | Status: DC | PRN
  Administered 2017-09-05: 4 mg via INTRAVENOUS
  Filled 2017-09-05: qty 2

## 2017-09-05 MED ORDER — SODIUM CHLORIDE 0.9% FLUSH: 3.0000 mL | INTRAVENOUS | Status: DC | PRN

## 2017-09-05 MED ORDER — FENTANYL 50 MCG/HR TD PT72
50.0000 ug | MEDICATED_PATCH | TRANSDERMAL | Status: DC
  Administered 2017-09-05: 50 ug via TRANSDERMAL
  Filled 2017-09-05: qty 2

## 2017-09-05 MED ORDER — INSULIN ASPART 100 UNIT/ML ~~LOC~~ SOLN
0.0000 [IU] | Freq: Three times a day (TID) | SUBCUTANEOUS | Status: DC
  Administered 2017-09-06: 2 [IU] via SUBCUTANEOUS
  Administered 2017-09-07: 1 [IU] via SUBCUTANEOUS
  Administered 2017-09-07: 2 [IU] via SUBCUTANEOUS
  Administered 2017-09-08: 1 [IU] via SUBCUTANEOUS
  Administered 2017-09-09: 2 [IU] via SUBCUTANEOUS
  Administered 2017-09-09 – 2017-09-13 (×2): 1 [IU] via SUBCUTANEOUS
  Administered 2017-09-15: 2 [IU] via SUBCUTANEOUS
  Administered 2017-09-15: 1 [IU] via SUBCUTANEOUS
  Administered 2017-09-16: 3 [IU] via SUBCUTANEOUS

## 2017-09-05 MED ORDER — KETOROLAC TROMETHAMINE 15 MG/ML IJ SOLN
15.0000 mg | Freq: Once | INTRAMUSCULAR | Status: AC
  Administered 2017-09-05: 15 mg via INTRAVENOUS
  Filled 2017-09-05: qty 1

## 2017-09-05 MED ORDER — INSULIN GLARGINE 100 UNIT/ML ~~LOC~~ SOLN
15.0000 [IU] | Freq: Every day | SUBCUTANEOUS | Status: DC
  Administered 2017-09-05 – 2017-09-15 (×10): 15 [IU] via SUBCUTANEOUS
  Filled 2017-09-05 (×12): qty 0.15

## 2017-09-05 MED ORDER — HYDROMORPHONE HCL 1 MG/ML IJ SOLN
1.0000 mg | Freq: Once | INTRAMUSCULAR | Status: AC
  Administered 2017-09-05: 1 mg via INTRAVENOUS
  Filled 2017-09-05: qty 1

## 2017-09-05 MED ORDER — INFLUENZA VAC SPLIT HIGH-DOSE 0.5 ML IM SUSY
0.5000 mL | PREFILLED_SYRINGE | INTRAMUSCULAR | Status: AC
  Administered 2017-09-06: 0.5 mL via INTRAMUSCULAR
  Filled 2017-09-05: qty 0.5

## 2017-09-05 MED ORDER — DOCUSATE SODIUM 100 MG PO CAPS
100.0000 mg | ORAL_CAPSULE | Freq: Two times a day (BID) | ORAL | Status: DC
  Administered 2017-09-05 – 2017-09-12 (×12): 100 mg via ORAL
  Filled 2017-09-05 (×14): qty 1

## 2017-09-05 MED ORDER — OXYCODONE-ACETAMINOPHEN 5-325 MG PO TABS
1.0000 | ORAL_TABLET | Freq: Four times a day (QID) | ORAL | Status: DC | PRN
  Administered 2017-09-06 (×3): 1 via ORAL
  Filled 2017-09-05 (×3): qty 1

## 2017-09-05 MED ORDER — ATENOLOL 25 MG PO TABS
100.0000 mg | ORAL_TABLET | Freq: Every day | ORAL | Status: DC
  Administered 2017-09-06 – 2017-09-09 (×4): 100 mg via ORAL
  Filled 2017-09-05: qty 2
  Filled 2017-09-05: qty 4
  Filled 2017-09-05 (×2): qty 2

## 2017-09-05 MED ORDER — POTASSIUM CHLORIDE 10 MEQ/100ML IV SOLN
10.0000 meq | INTRAVENOUS | Status: AC
  Administered 2017-09-05 (×2): 10 meq via INTRAVENOUS
  Filled 2017-09-05 (×2): qty 100

## 2017-09-05 MED ORDER — HYDROMORPHONE HCL 1 MG/ML IJ SOLN
0.5000 mg | Freq: Once | INTRAMUSCULAR | Status: AC
  Administered 2017-09-05: 0.5 mg via INTRAVENOUS
  Filled 2017-09-05: qty 1

## 2017-09-05 MED ORDER — ATENOLOL-CHLORTHALIDONE 100-25 MG PO TABS: 1.0000 | ORAL_TABLET | Freq: Every day | ORAL | Status: DC

## 2017-09-05 MED ORDER — ACETAMINOPHEN 650 MG RE SUPP: 650.0000 mg | Freq: Four times a day (QID) | RECTAL | Status: DC | PRN

## 2017-09-05 MED ORDER — ONDANSETRON HCL 4 MG PO TABS: 4.0000 mg | ORAL_TABLET | Freq: Four times a day (QID) | ORAL | Status: DC | PRN

## 2017-09-05 MED ORDER — ACETAMINOPHEN 325 MG PO TABS
650.0000 mg | ORAL_TABLET | Freq: Four times a day (QID) | ORAL | Status: DC | PRN
  Administered 2017-09-12 – 2017-09-15 (×3): 650 mg via ORAL
  Filled 2017-09-05 (×4): qty 2

## 2017-09-05 MED ORDER — POTASSIUM CHLORIDE CRYS ER 20 MEQ PO TBCR
40.0000 meq | EXTENDED_RELEASE_TABLET | Freq: Once | ORAL | Status: AC
  Administered 2017-09-05: 40 meq via ORAL
  Filled 2017-09-05: qty 2

## 2017-09-05 MED ORDER — CYCLOBENZAPRINE HCL 10 MG PO TABS
10.0000 mg | ORAL_TABLET | Freq: Three times a day (TID) | ORAL | Status: DC
  Administered 2017-09-05 – 2017-09-09 (×9): 10 mg via ORAL
  Filled 2017-09-05 (×9): qty 1

## 2017-09-05 MED ORDER — SODIUM CHLORIDE 0.9 % IV SOLN
INTRAVENOUS | Status: DC
  Administered 2017-09-05 – 2017-09-11 (×5): via INTRAVENOUS

## 2017-09-05 NOTE — ED Notes (Signed)
Pt family concerned about speaking with case management. Edwin Cap called and spoke with patients wife, Rosemarie Ax.

## 2017-09-05 NOTE — ED Notes (Signed)
Patient pulled out IV. MD aware pt has bouts of confusion. New IV placed. Okay to ambulate in hall when patient feels his pain is decreased and/or IV medications are finished per Dr. Tomi Bamberger.

## 2017-09-05 NOTE — H&P (Signed)
History and Physical    Ryan Duke:[id number] DOB: [birth year] DOA: 09/05/2017  PCP: Janith Lima, MD   Patient coming from: Home  Chief Complaint: Back pain and ambulatory dusfunction  HPI: Ryan Duke is a 76 y.o. male with medical history significant of type 2 diabetes mellitus and hypertension, who was recently diagnosed with L2 compression fracture with 30% loss of height with no retropulsion, (September 21). He was discharged from emergency department with Vicodin and Zofran. He had a follow-up with neurosurgery on October 4th, apparently he was told that his fracture was healing well, no surgical intervention required. Hydrocodone was changed to oxycodone to improve pain control. Over the following 3 days patient's pain has been persistent, sharp in nature, 10 out of 10 in intensity, localized lower back, worse with movement, no significant improvement with narcotics, associated with decreased by mouth intake, weight loss and confusion. Due to confusion he stopped taking narcotics about 48 hours ago, today his confusion was worse, now associated with hallucinations, and he was noted to be unable to get out of his bed by himself. His wife called 68 and he was brought to the hospital for further evaluation.   ED Course: Patient received IV Fentanyl and hydromorphone with persistent back pain, and ambulatory dysfunction.   Review of Systems:  1. General: No fevers, no chills, no weight gain, positive weight loss 2. ENT: No runny nose or sore throat, no hearing disturbances 3. Pulmonary: No dyspnea, cough, wheezing, or hemoptysis 4. Cardiovascular: No angina, claudication, lower extremity edema, pnd or orthopnea 5. Gastrointestinal: No nausea or vomiting, no diarrhea, but constipation 6. Hematology: No easy bruisability or frequent infections 7. Urology: No dysuria, hematuria or increased urinary frequency, decrease urination per his wife report.  8. Dermatology: No  rashes. 9. Neurology: No seizures or paresthesias, positive for confusion and hallucinations, as mentioned in the HPI.  10. Musculoskeletal: No joint pain or deformities  Past Medical History:  Diagnosis Date  . Diabetes mellitus    type 2  . Hyperlipidemia   . Hypertension   . Hypogonadism, male   . Pancreatitis 2008  . Renal insufficiency     History reviewed. No pertinent surgical history.   reports that he quit smoking about 42 years ago. He has never used smokeless tobacco. He reports that he drinks about 12.6 oz of alcohol per week . He reports that he does not use drugs.  Allergies  Allergen Reactions  . Wilder Glade [Dapagliflozin] Other (See Comments)    gout  . Metformin And Related Other (See Comments)    acidosis  . Atorvastatin Other (See Comments)    REACTION: h/a  . Benazepril Hcl Other (See Comments)    Was not effective    Family History  Problem Relation Age of Onset  . Hypertension Other    Unacceptable: Noncontributory, unremarkable, or negative. Acceptable: Family history reviewed and not pertinent (If you reviewed it)  Prior to Admission medications   Medication Sig Start Date End Date Taking? Authorizing Provider  atenolol-chlorthalidone (TENORETIC) 100-25 MG tablet Take 1 tablet by mouth daily. 09/03/17  Yes Janith Lima, MD  cyclobenzaprine (FLEXERIL) 10 MG tablet Take 10 mg by mouth 3 (three) times daily.   Yes [provider]  HYDROcodone-acetaminophen (NORCO/VICODIN) 5-325 MG tablet Take 1-2 tablets by mouth every 6 (six) hours as needed. Patient taking differently: Take 1-2 tablets by mouth every 6 (six) hours as needed for moderate pain.  08/21/17  Yes Ward, Delice Bison,  DO  Insulin Glargine (TOUJEO SOLOSTAR) 300 UNIT/ML SOPN Inject 30 Units into the skin daily. Patient taking differently: Inject 30 Units into the skin at bedtime.  06/18/17  Yes Janith Lima, MD  JANUVIA 100 MG tablet TAKE 1 TABLET ONCE DAILY. 08/28/17  Yes Janith Lima, MD  losartan (COZAAR) 100 MG tablet Take 1 tablet (100 mg total) by mouth daily. 09/03/17  Yes Janith Lima, MD  ondansetron (ZOFRAN ODT) 4 MG disintegrating tablet Take 1 tablet (4 mg total) by mouth every 8 (eight) hours as needed for nausea or vomiting. 08/21/17  Yes Ward, Delice Bison, DO  oxyCODONE-acetaminophen (PERCOCET/ROXICET) 5-325 MG tablet Take 0.5-1 tablets by mouth 4 (four) times daily as needed for moderate pain.  09/02/17  Yes [provider]  pravastatin (PRAVACHOL) 40 MG tablet TAKE 1 TABLET ONCE DAILY. 12/07/16  Yes Janith Lima, MD  fentaNYL (DURAGESIC - DOSED MCG/HR) 50 MCG/HR Place 1 patch (50 mcg total) onto the skin every 3 (three) days. 09/05/17   Dorie Rank, MD  Lancets MISC Test blood sugar twice daily as directed. Dx:250.02 10/04/12   Janith Lima, MD    Physical Exam: Vitals:   09/05/17 1430 09/05/17 1445 09/05/17 1500 09/05/17 1709  BP: 112/79  (!) 126/106 (!) 169/93  Pulse: 82 73 78 78  Resp: (!) 21 19 19 20   Temp:      TempSrc:      SpO2: 95% 95% 97% 98%  Weight:      Height:        Constitutional: deconditioned.  Vitals:   09/05/17 1430 09/05/17 1445 09/05/17 1500 09/05/17 1709  BP: 112/79  (!) 126/106 (!) 169/93  Pulse: 82 73 78 78  Resp: (!) 21 19 19 20   Temp:      TempSrc:      SpO2: 95% 95% 97% 98%  Weight:      Height:       Eyes: PERRL, lids and conjunctivae normal. Head normocephalic, nose and ears no deformities.  ENMT: Mucous membranes are dry. Posterior pharynx clear of any exudate or lesions.Normal dentition.  Neck: normal, supple, no masses, no thyromegaly Respiratory: decreased breath sounds at auscultation bilaterally at bases, no wheezing, no crackles. Normal respiratory effort. No accessory muscle use.  Cardiovascular: Regular rate and rhythm, no murmurs / rubs / gallops. No extremity edema. 2+ pedal pulses. No carotid bruits.  Abdomen: protuberant with no tenderness, no masses palpated. No hepatosplenomegaly. Bowel  sounds positive.  Musculoskeletal: no clubbing / cyanosis. No joint deformity upper and lower extremities. Good ROM, no contractures. Normal muscle tone. Decrease range of motion of the lower thoracic and lumbar region due to pain.  Skin: no rashes, lesions, ulcers. No induration Neurologic: CN 2-12 grossly intact. Sensation intact, DTR normal. Strength 5/5 in all 4.     Labs on Admission: I have personally reviewed following labs and imaging studies  CBC:  Recent Labs Lab 09/05/17 1124  WBC 11.0*  NEUTROABS 9.0*  HGB 16.6  HCT 47.6  MCV 92.8  PLT 094   Basic Metabolic Panel:  Recent Labs Lab 09/05/17 1124  NA 138  K 2.7*  CL 97*  CO2 28  GLUCOSE 182*  BUN 31*  CREATININE 1.58*  CALCIUM 9.3   GFR: Estimated Creatinine Clearance: 49.3 mL/min (A) (by C-G formula based on SCr of 1.58 mg/dL (H)). Liver Function Tests:  Recent Labs Lab 09/05/17 1124  AST 25  ALT 23  ALKPHOS 118  BILITOT 0.8  PROT 7.1  ALBUMIN 3.9   No results for input(s): LIPASE, AMYLASE in the last 168 hours. No results for input(s): AMMONIA in the last 168 hours. Coagulation Profile: No results for input(s): INR, PROTIME in the last 168 hours. Cardiac Enzymes: No results for input(s): CKTOTAL, CKMB, CKMBINDEX, TROPONINI in the last 168 hours. BNP (last 3 results) No results for input(s): PROBNP in the last 8760 hours. HbA1C: No results for input(s): HGBA1C in the last 72 hours. CBG: No results for input(s): GLUCAP in the last 168 hours. Lipid Profile: No results for input(s): CHOL, HDL, LDLCALC, TRIG, CHOLHDL, LDLDIRECT in the last 72 hours. Thyroid Function Tests: No results for input(s): TSH, T4TOTAL, FREET4, T3FREE, THYROIDAB in the last 72 hours. Anemia Panel: No results for input(s): VITAMINB12, FOLATE, FERRITIN, TIBC, IRON, RETICCTPCT in the last 72 hours. Urine analysis:    Component Value Date/Time   COLORURINE YELLOW 09/05/2017 1702   APPEARANCEUR HAZY (A) 09/05/2017 1702    LABSPEC 1.030 09/05/2017 1702   PHURINE 5.0 09/05/2017 1702   GLUCOSEU NEGATIVE 09/05/2017 1702   GLUCOSEU NEGATIVE 03/15/2017 1002   HGBUR NEGATIVE 09/05/2017 1702   BILIRUBINUR NEGATIVE 09/05/2017 1702   KETONESUR NEGATIVE 09/05/2017 1702   PROTEINUR 100 (A) 09/05/2017 1702   UROBILINOGEN 0.2 03/15/2017 1002   NITRITE NEGATIVE 09/05/2017 1702   LEUKOCYTESUR NEGATIVE 09/05/2017 1702    Radiological Exams on Admission: Dg Lumbar Spine Complete  Result Date: 09/05/2017 CLINICAL DATA:  76 year old male with lumbar spine pain, altered mental status and confusion EXAM: LUMBAR SPINE - COMPLETE 4+ VIEW COMPARISON:  Prior lumbar spine radiographs 08/20/2017 FINDINGS: Slight interval progression of height loss of the L2 compression fracture which is now greater than 50% centrally. No new compression fracture identified. Stable multilevel degenerative disc disease and advanced lower lumbar facet arthropathy. Stable mild grade 1 anterolisthesis of L4 on L5. The visualized bowel gas pattern is unremarkable. IMPRESSION: 1. Slight interval progression of height loss of the previously noted L2 compression fracture now representing greater than 50% height loss within the central aspect of the vertebral body. 2. No new fracture or malalignment. 3. Multilevel degenerative disc disease and lower lumbar facet arthropathy. Electronically Signed   By: Jacqulynn Cadet M.D.   On: 09/05/2017 12:52    EKG: Independently reviewed. NA  Assessment/Plan Active Problems:   Back pain  76 year old male who presented with back pain and confusion, recent diagnosis of L2 compression fracture, he has been on narcotics with persistent pain, now complicated with metabolic encephalopathy, and significant ambulatory dysfunction. On his initial physical examination, blood pressure 169/93, heart rate 78, respiratory rate 20, oxygen saturation 90% on room air, temperature 97.9. Dry mucous membranes, lungs are clear to  auscultation bilaterally, heart S1-S2 present rhythmic, the abdomen is protuberant, nontender, nondistended, no lower extremity edema, range of motion of her back is limited due to pain. Sodium 138, potassium 2.7, chloride 97, bicarbonate 28, glucose 182, BUN 31, creatinine 1.52, AST 25, ALT 23, white count 11.0, hemoglobin 16.6, hematocrit 47.6, , platelets 332. Urinalysis negative for infection. Lumbar x-rays with L2 compression fracture now representing greater than 50% height loss within the central aspect of the vertebral body. No new fracture malalignment, multilevel degenerative disc disease in lower lumbar facet atrophy.   Patient will be admitted to the hospital working diagnosis of acute on chronic back pain due to worsening L2 compression fracture, complicated by metabolic encephalopathy and hypokalemia.  1. Acute on chronic back pain. Patient known to have compression fracture, L2,  imaging shown worsening height loss now greater than 50%. Emergency department staff discussed the case with neurosurgery, recommend medical management. Will admit patient to medical work, will place on IV analgesics with morphine, IV fluids, physical therapy and occupational therapy evaluation. Will continue oral muscle relaxants, and oral narcotics. Patient does have chronic kidney disease, contraindicating use of systemic nonsteroidal anti-inflammatory agents. Will do a trial of topical Voltaren.   2. Metabolic encephalopathy. Likely triggered by uncontrolled pain, and poor oral intake. Narcotics canals to be exacerbating his confusion. Currently no agitation. Will continue aggressive pain control, neurochecks every 4 hours, hydration with IV fluids.   3. Chronic kidney disease stage 3 with Hypokalemia. Hypokalemia likely related to poor oral intake combined with diuretic use, patient has received 60 mEq of potassium chloride in the emergency department. Will follow-up kidney function morning.  4. Hypertension.  Will continue blood pressure regimen with atenolol/chlorthalidone, and losartan. Blood pressure elevated on admission.   5. Type 2 diabetes mellitus. Will continue glucose coverage and monitoring with insulin sliding-scale. Basal insulin with insulin glargine, will use 15 units, due to risk of hypoglycemia in the setting of poor oral intake. Hold on oral hypoglycemic agent/Januvia.  6. Dyslipidemia. Continue pravastatin.  DVT prophylaxis: heparin Code Status: full  Family Communication: I spoke with patient's wife at the bedside and all questions were addressed  Disposition Plan: Home  Consults called: Na  Admission status: Inpatient.     Camela Wich Gerome Apley MD [facility] Pager [phone]  If 7PM-7AM, please contact night-coverage [email or web address] Password [facility]  09/05/2017, 6:26 PM

## 2017-09-05 NOTE — ED Triage Notes (Signed)
Per EMS, patient comes from home. He fell 2 weeks ago and fractured his back and has been taking oxycodone for pain. After a week of being on it, he felt the medication was making him "sick" so this Friday the wife gave him hydrocodone and since then the wife states he has been acting "weird and having hallucinations" and called EMS saying he hasn't moved out of bed since yesterday. Wife gave him aleve this morning. Pt is only c/o his chronic leg pain today. A&O, Able to answer all questions appropriately.

## 2017-09-05 NOTE — ED Notes (Signed)
Patient transported to X-ray 

## 2017-09-05 NOTE — ED Notes (Signed)
Hospitalist at bedside 

## 2017-09-05 NOTE — ED Provider Notes (Signed)
12:51 AM Assumed care from Dr. Tomi Bamberger, please see their note for full history, physical and decision making until this point. In brief this is a 76 y.o. year old male who presented to the ED tonight with worsening back pain and leg pain after being dx w/ compression fracture.   Plan for pain control and discharge or admission if not successful. Pain not controlled. Apparently hallucinating as well. Will admit for low k, intractable pain and PT eval.     Labs, studies and imaging reviewed by myself and considered in medical decision making if ordered. Imaging interpreted by radiology.  Labs Reviewed  CBC WITH DIFFERENTIAL/PLATELET - Abnormal; Notable for the following:       Result Value   WBC 11.0 (*)    Neutro Abs 9.0 (*)    All other components within normal limits  COMPREHENSIVE METABOLIC PANEL - Abnormal; Notable for the following:    Potassium 2.7 (*)    Chloride 97 (*)    Glucose, Bld 182 (*)    BUN 31 (*)    Creatinine, Ser 1.58 (*)    GFR calc non Af Amer 40 (*)    GFR calc Af Amer 47 (*)    All other components within normal limits  URINALYSIS, ROUTINE W REFLEX MICROSCOPIC - Abnormal; Notable for the following:    APPearance HAZY (*)    Protein, ur 100 (*)    Bacteria, UA RARE (*)    Squamous Epithelial / LPF 0-5 (*)    All other components within normal limits  GLUCOSE, CAPILLARY - Abnormal; Notable for the following:    Glucose-Capillary 207 (*)    All other components within normal limits  CBC  BASIC METABOLIC PANEL    DG Lumbar Spine Complete  Final Result      No Follow-up on file.    Ryan Duke, Corene Cornea, MD 09/06/17 [id number]

## 2017-09-05 NOTE — ED Notes (Signed)
Call report to [place] at 8:15 pm

## 2017-09-05 NOTE — ED Notes (Signed)
Pt unable to ambulate in hall. Pt sat on edge of bed, put back brace on and was very unsteady upon standing. Dr. Tomi Bamberger aware.

## 2017-09-05 NOTE — Progress Notes (Addendum)
Received call from Dr Tomi Bamberger ([place]) regarding patient. This patient known to myself sd he was just seen in clinic this past week for f/u of L2 compression fracture. Came into ER for worsening back pain. Xray L spine shows slight worsening in L2 compression fracture. Still no acute NS intervention. Attempt at getting pain under control in ER. If unable to, may need to be admitted under hospitalist service. TLSO when upright and OOB. Can follow up outpt after discharge as originally scheduled in 4 weeks for monitoring.

## 2017-09-05 NOTE — ED Notes (Signed)
Bed: [facility] Expected date:  Expected time:  Means of arrival:  Comments: 76 yo leg pain

## 2017-09-05 NOTE — ED Notes (Signed)
Date and time results received: 09/05/17 1154 (use smartphrase ".now" to insert current time)  Test: K+ Critical Value: 2.7  Name of Provider Notified: Tomi Bamberger  Orders Received? Or Actions Taken?: Actions Taken: Advised Dr Tomi Bamberger

## 2017-09-05 NOTE — ED Provider Notes (Signed)
[facility] DEPT Provider Note   CSN: [id number] Arrival date & time: 09/05/17  1013     History   Chief Complaint No chief complaint on file.   HPI Ryan Duke is a 76 y.o. male.  HPI Patient presents to the emergency room for evaluation of persistent low back pain. Patient fell a couple of weeks ago. He is experiencing low back pain. He was evaluated in the emergency room on September 21.  Patient had x-rays including a CT scan that demonstrated no L2 compression fracture.  The patient was given a TLSO brace. He was given a prescription for pain meds and discharged with outpatient follow-up.  Patient states he followed up with neurosurgery. He was told he would not need any surgery. He was taking oxycodone but was having side effects from that medication. He started taking hydrocodone but as soon as the medication wears off he starts having pain. According to the EMS report the patient has not been able to get out of bed since yesterday. Patient states he is not having any pain in his back right now but as soon as he tries to walk or stand he has more severe pain.  Per his wife, the patient has also been having some confusion.   It may be related to the medications.  She clarified that she did call the office but has not spoken to the medial provider. Past Medical History:  Diagnosis Date  . Diabetes mellitus    type 2  . Hyperlipidemia   . Hypertension   . Hypogonadism, male   . Pancreatitis 2008  . Renal insufficiency     Patient Active Problem List   Diagnosis Date Noted  . Mild intermittent asthma without complication 02/54/2706  . Gout 11/17/2016  . Alcoholic hepatitis without ascites 11/12/2016  . Primary gout 07/07/2016  . Depression, acute 09/15/2015  . Erectile dysfunction 03/06/2014  . Routine general medical examination at a health care facility 09/18/2011  . GERD 07/22/2010  . BPH (benign prostatic hyperplasia) 09/02/2009  . Kidney disease, chronic,  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) ([place]) 02/26/2009  . Aortic valve disorder 02/25/2009  . PERIPHERAL VASCULAR DISEASE 02/25/2009  . Type II diabetes mellitus with manifestations ([place]) 11/01/2007  . Hyperlipidemia with target LDL less than 100 07/01/2007  . Obesity 07/01/2007  . Essential hypertension 07/01/2007    History reviewed. No pertinent surgical history.     Home Medications    Prior to Admission medications   Medication Sig Start Date End Date Taking? Authorizing Provider  atenolol-chlorthalidone (TENORETIC) 100-25 MG tablet Take 1 tablet by mouth daily. 09/03/17  Yes Janith Lima, MD  cyclobenzaprine (FLEXERIL) 10 MG tablet Take 10 mg by mouth 3 (three) times daily.   Yes [provider]  HYDROcodone-acetaminophen (NORCO/VICODIN) 5-325 MG tablet Take 1-2 tablets by mouth every 6 (six) hours as needed. Patient taking differently: Take 1-2 tablets by mouth every 6 (six) hours as needed for moderate pain.  08/21/17  Yes Ward, Delice Bison, DO  Insulin Glargine (TOUJEO SOLOSTAR) 300 UNIT/ML SOPN Inject 30 Units into the skin daily. Patient taking differently: Inject 30 Units into the skin at bedtime.  06/18/17  Yes Janith Lima, MD  JANUVIA 100 MG tablet TAKE 1 TABLET ONCE DAILY. 08/28/17  Yes Janith Lima, MD  losartan (COZAAR) 100 MG tablet Take 1 tablet (100 mg total) by mouth daily. 09/03/17  Yes Janith Lima, MD  ondansetron (ZOFRAN ODT) 4 MG disintegrating tablet Take 1  tablet (4 mg total) by mouth every 8 (eight) hours as needed for nausea or vomiting. 08/21/17  Yes Ward, Delice Bison, DO  oxyCODONE-acetaminophen (PERCOCET/ROXICET) 5-325 MG tablet Take 0.5-1 tablets by mouth 4 (four) times daily as needed for moderate pain.  09/02/17  Yes [provider]  pravastatin (PRAVACHOL) 40 MG tablet TAKE 1 TABLET ONCE DAILY. 12/07/16  Yes Janith Lima, MD  fentaNYL (DURAGESIC - DOSED MCG/HR) 50 MCG/HR Place 1 patch (50 mcg total) onto the skin every 3 (three) days.  09/05/17   Dorie Rank, MD  Lancets MISC Test blood sugar twice daily as directed. Dx:250.02 10/04/12   Janith Lima, MD    Family History Family History  Problem Relation Age of Onset  . Hypertension Other     Social History Social History  Substance Use Topics  . Smoking status: Former Smoker    Quit date: 11/30/1974  . Smokeless tobacco: Never Used  . Alcohol use 12.6 oz/week    21 Shots of liquor per week     Allergies   Farxiga [dapagliflozin]; Metformin and related; Atorvastatin; and Benazepril hcl   Review of Systems Review of Systems  All other systems reviewed and are negative.    Physical Exam Updated Vital Signs BP (!) 126/106   Pulse 78   Temp 97.9 F (36.6 C) (Oral)   Resp 19   Ht 1.803 m (5\' 11" )   Wt 113.4 kg (250 lb)   SpO2 97%   BMI 34.87 kg/m   Physical Exam  Constitutional: No distress.  Elderly   HENT:  Head: Normocephalic and atraumatic.  Right Ear: External ear normal.  Left Ear: External ear normal.  Eyes: Conjunctivae are normal. Right eye exhibits no discharge. Left eye exhibits no discharge. No scleral icterus.  Neck: Neck supple. No tracheal deviation present.  Cardiovascular: Normal rate, regular rhythm and intact distal pulses.   Dusky color to bilateral feet, palpable pt pulses bilaterally, dp pulse weak on the left, palpable on the right  Pulmonary/Chest: Effort normal and breath sounds normal. No stridor. No respiratory distress. He has no wheezes. He has no rales.  Abdominal: Soft. Bowel sounds are normal. He exhibits no distension. There is no tenderness. There is no rebound and no guarding.  Musculoskeletal: He exhibits no edema or tenderness.  No spinal ttp, limited ROM due to pain  Neurological: He is alert. He has normal strength. No cranial nerve deficit (no facial droop, extraocular movements intact, no slurred speech) or sensory deficit. He exhibits normal muscle tone. He displays no seizure activity. Coordination  normal.  Skin: Skin is warm and dry. No rash noted. He is not diaphoretic.  Psychiatric: He has a normal mood and affect.  Nursing note and vitals reviewed.    ED Treatments / Results  Labs (all labs ordered are listed, but only abnormal results are displayed) Labs Reviewed  CBC WITH DIFFERENTIAL/PLATELET - Abnormal; Notable for the following:       Result Value   WBC 11.0 (*)    Neutro Abs 9.0 (*)    All other components within normal limits  COMPREHENSIVE METABOLIC PANEL - Abnormal; Notable for the following:    Potassium 2.7 (*)    Chloride 97 (*)    Glucose, Bld 182 (*)    BUN 31 (*)    Creatinine, Ser 1.58 (*)    GFR calc non Af Amer 40 (*)    GFR calc Af Amer 47 (*)    All other components  within normal limits    EKG  EKG Interpretation None       Radiology Dg Lumbar Spine Complete  Result Date: 09/05/2017 CLINICAL DATA:  76 year old male with lumbar spine pain, altered mental status and confusion EXAM: LUMBAR SPINE - COMPLETE 4+ VIEW COMPARISON:  Prior lumbar spine radiographs 08/20/2017 FINDINGS: Slight interval progression of height loss of the L2 compression fracture which is now greater than 50% centrally. No new compression fracture identified. Stable multilevel degenerative disc disease and advanced lower lumbar facet arthropathy. Stable mild grade 1 anterolisthesis of L4 on L5. The visualized bowel gas pattern is unremarkable. IMPRESSION: 1. Slight interval progression of height loss of the previously noted L2 compression fracture now representing greater than 50% height loss within the central aspect of the vertebral body. 2. No new fracture or malalignment. 3. Multilevel degenerative disc disease and lower lumbar facet arthropathy. Electronically Signed   By: Jacqulynn Cadet M.D.   On: 09/05/2017 12:52    Procedures Procedures (including critical care time)  Medications Ordered in ED Medications  ketorolac (TORADOL) 15 MG/ML injection 15 mg (not  administered)  HYDROmorphone (DILAUDID) injection 0.5 mg (not administered)  fentaNYL (DURAGESIC - dosed mcg/hr) patch 50 mcg (not administered)  potassium chloride SA (K-DUR,KLOR-CON) CR tablet 40 mEq (40 mEq Oral Given 09/05/17 1207)  potassium chloride 10 mEq in 100 mL IVPB (0 mEq Intravenous Stopped 09/05/17 1438)  magnesium sulfate IVPB 1 g 100 mL (0 g Intravenous Stopped 09/05/17 1605)  HYDROmorphone (DILAUDID) injection 1 mg (1 mg Intravenous Given 09/05/17 1329)     Initial Impression / Assessment and Plan / ED Course  I have reviewed the triage vital signs and the nursing notes.  Pertinent labs & imaging results that were available during my care of the patient were reviewed by me and considered in my medical decision making (see chart for details).  Clinical Course as of Sep 06 1611  Sun Sep 05, 2017  1201 Discussed with neurosurgery.  Pt's xray was stable this past week.  ED can evaluate the patient and call back if there are any emergent neurosurgical issues.  [JK]  1600 Pt tried to walk but started having pain.  He would still like to go home.  Will try another dose of pain med  [YT]  0354 Will make sure pt can safely ambulate.  Home health ordered.  Plan on dc as long as pt is able to safely go home.  Pt and wife are in agreement.  [JK]    Clinical Course User Index [JK] Dorie Rank, MD   Pt presented with persistent and worsening back pain.  Compression fx increased on xray.  Discussed with neurosurgery.   Kyphoplasty would not be indicated at this time.  Pt can be discharged as long as we can manage his pain.   Will try fentanyl patches at home.  Pt would still like to go home.  Will try another dose of pain meds.  Home health and case management ordered  Final Clinical Impressions(s) / ED Diagnoses   Final diagnoses:  Closed compression fracture of L2 lumbar vertebra with delayed healing, subsequent encounter  Hypokalemia    New Prescriptions New Prescriptions    FENTANYL (DURAGESIC - DOSED MCG/HR) 50 MCG/HR    Place 1 patch (50 mcg total) onto the skin every 3 (three) days.     Dorie Rank, MD 09/05/17 [id number]

## 2017-09-05 NOTE — Care Management Note (Signed)
Case Management Note  Patient Details  Name: Ryan Duke MRN: [id number] Date of Birth: [birth year]  Subjective/Objective:   L2 compression fracture                 Action/Plan: Discharge Planning: NCM spoke to pt and wife at bedside. Waiting PT/OT recommendations. Pt agreeable to go to SNF rehab. CSW referral for SNF -rehab.   PCP  Janith Lima MD  Expected Discharge Date:                  Expected Discharge Plan:  [place]  In-House Referral:  Clinical Social Work  Discharge planning Services  CM Consult  Post Acute Care Choice:  NA Choice offered to:  NA  DME Arranged:  N/A DME Agency:  NA  HH Arranged:  NA HH Agency:  NA  Status of Service:  In process, will continue to follow  If discussed at Long Length of Stay Meetings, dates discussed:    Additional Comments:  Erenest Rasher, RN 09/05/2017, 5:53 PM

## 2017-09-06 ENCOUNTER — Inpatient Hospital Stay (HOSPITAL_COMMUNITY): Payer: Medicare Other

## 2017-09-06 ENCOUNTER — Ambulatory Visit (HOSPITAL_COMMUNITY): Payer: Medicare Other

## 2017-09-06 DIAGNOSIS — M5440 Lumbago with sciatica, unspecified side: Secondary | ICD-10-CM

## 2017-09-06 DIAGNOSIS — M79606 Pain in leg, unspecified: Secondary | ICD-10-CM

## 2017-09-06 DIAGNOSIS — S32020A Wedge compression fracture of second lumbar vertebra, initial encounter for closed fracture: Secondary | ICD-10-CM

## 2017-09-06 LAB — GLUCOSE, CAPILLARY
GLUCOSE-CAPILLARY: 83 mg/dL (ref 65–99)
Glucose-Capillary: 103 mg/dL — ABNORMAL HIGH (ref 65–99)
Glucose-Capillary: 168 mg/dL — ABNORMAL HIGH (ref 65–99)
Glucose-Capillary: 131 mg/dL — ABNORMAL HIGH (ref 65–99)

## 2017-09-06 LAB — CBC
HEMATOCRIT: 44.4 % (ref 39.0–52.0)
HEMOGLOBIN: 14.7 g/dL (ref 13.0–17.0)
MCH: 31.4 pg (ref 26.0–34.0)
MCHC: 33.1 g/dL (ref 30.0–36.0)
MCV: 94.9 fL (ref 78.0–100.0)
Platelets: 293 10*3/uL (ref 150–400)
RBC: 4.68 MIL/uL (ref 4.22–5.81)
RDW: 12.8 % (ref 11.5–15.5)
WBC: 10.7 10*3/uL — AB (ref 4.0–10.5)

## 2017-09-06 LAB — BASIC METABOLIC PANEL
ANION GAP: 10 (ref 5–15)
BUN: 28 mg/dL — AB (ref 6–20)
CHLORIDE: 102 mmol/L (ref 101–111)
CO2: 27 mmol/L (ref 22–32)
Calcium: 8.7 mg/dL — ABNORMAL LOW (ref 8.9–10.3)
Creatinine, Ser: 1.39 mg/dL — ABNORMAL HIGH (ref 0.61–1.24)
GFR calc Af Amer: 54 mL/min — ABNORMAL LOW (ref >60)
GFR calc non Af Amer: 47 mL/min — ABNORMAL LOW (ref >60)
GLUCOSE: 120 mg/dL — AB (ref 65–99)
POTASSIUM: 3.8 mmol/L (ref 3.5–5.1)
Sodium: 139 mmol/L (ref 135–145)

## 2017-09-06 MED ORDER — HYDRALAZINE HCL 20 MG/ML IJ SOLN
10.0000 mg | Freq: Four times a day (QID) | INTRAMUSCULAR | Status: DC | PRN
  Administered 2017-09-07: 10 mg via INTRAVENOUS
  Filled 2017-09-06: qty 1

## 2017-09-06 NOTE — Progress Notes (Addendum)
[facility] PROGRESS NOTE  Ryan Duke GEX:[id number] DOB: 1941/08/26 DOA: 09/05/2017   PCP: Janith Lima, MD     Assessment/Plan: Active Problems:   Back pain   Closed compression fracture of L2 lumbar vertebra ([place])   76 year old male who presented with back pain and confusion, recent diagnosis of L2 compression fracture, he has been on narcotics with persistent pain, now complicated with metabolic encephalopathy, and significant ambulatory dysfunction. On his initial physical examination, blood pressure 169/93, heart rate 78, respiratory rate 20, oxygen saturation 90% on room air, temperature 97.9. Dry mucous membranes, lungs are clear to auscultation bilaterally, heart S1-S2 present rhythmic, the abdomen is protuberant, nontender, nondistended, no lower extremity edema, range of motion of her back is limited due to pain. Sodium 138, potassium 2.7, chloride 97, bicarbonate 28, glucose 182, BUN 31, creatinine 1.52, AST 25, ALT 23, white count 11.0, hemoglobin 16.6, hematocrit 47.6, , platelets 332. Urinalysis negative for infection. Lumbar x-rays with L2 compression fracture now representing greater than 50% height loss within the central aspect of the vertebral body. No new fracture malalignment, multilevel degenerative disc disease in lower lumbar facet atrophy.   Patient will be admitted to the hospital working diagnosis of acute on chronic back pain due to worsening L2 compression fracture, complicated by metabolic encephalopathy and hypokalemia  Assessment and plan  1. Acute on chronic back pain. Patient known to have compression fracture, L2, imaging shown worsening height loss now greater than 50%. Emergency department staff discussed the case with neurosurgery, recommend medical management.  Supportive therapy, morphine, IV fluids, interventional radiology has been consulted to explore options for kyphoplasty. Insurance authorization and MRI pending.  Patient has  failed outpatient treatment on oral medications and has had unsubstantiated progress with his symptoms in the outpatient setting   2. Metabolic encephalopathy. Likely triggered by uncontrolled pain, and poor oral intake. Narcotics canals to be exacerbating his confusion. Currently no agitation. Will continue aggressive pain control, neurochecks every 4 hours, hydration with IV fluids.   3. Chronic kidney disease stage 3 with Hypokalemia. Hypokalemia likely related to poor oral intake combined with diuretic use, patient has received 60 mEq of potassium chloride in the emergency department. Will follow-up kidney function morning.  4. Hypertension. Will continue blood pressure regimen with atenolol/chlorthalidone, and losartan. Blood pressure elevated on admission.   5. Type 2 diabetes mellitus. Will continue glucose coverage and monitoring with insulin sliding-scale. Basal insulin with insulin glargine, will use 15 units, due to risk of hypoglycemia in the setting of poor oral intake. Hold on oral hypoglycemic agent/Januvia.  6. Dyslipidemia. Continue pravastatin.  3. Hypokalemia could be related to chlorthalidone, now repleted   DVT prophylaxsis heparin  Code Status:   Full code     Family Communication: Discussed in detail with the patient, all imaging results, lab results explained to the patient   Disposition Plan:   1-2 days     Consultants:  None  Procedures:  None    Antibiotics: Anti-infectives    None         HPI/Subjective: patient complains of low back pain and bilateral high pain and upper extremity pain  Objective: Vitals:   09/05/17 2009 09/05/17 2044 09/06/17 0500 09/06/17 0938  BP: (!) 164/86 (!) 175/86 (!) 168/82 139/81  Pulse: 78 78 89 90  Resp: 18 18 18    Temp:  97.8 F (36.6 C) 98 F (36.7 C)   TempSrc:  Oral Oral   SpO2: 99%  97% 92%   Weight:  100.7 kg (222 lb 0.1 oz)    Height:  5\' 11"  (1.803 m)      Intake/Output Summary (Last 24  hours) at 09/06/17 1205 Last data filed at 09/06/17 1000  Gross per 24 hour  Intake          1773.75 ml  Output              825 ml  Net           948.75 ml    Exam:  Examination:  General exam: Appears calm and comfortable  Respiratory system: Clear to auscultation. Respiratory effort normal. Cardiovascular system: S1 & S2 heard, RRR. No JVD, murmurs, rubs, gallops or clicks. No pedal edema. Gastrointestinal system: Abdomen is nondistended, soft and nontender. No organomegaly or masses felt. Normal bowel sounds heard. Central nervous system: Alert and oriented. No focal neurological deficits. Extremities:  Bilateral lower extremity pain with mottled appearance of the skin of both legs Skin: No rashes, lesions or ulcers Psychiatry: Judgement and insight appear normal. Mood & affect appropriate.     Data Reviewed: I have personally reviewed following labs and imaging studies  Micro Results No results found for this or any previous visit (from the past 240 hour(s)).  Radiology Reports Dg Chest 2 View  Result Date: 08/20/2017 CLINICAL DATA:  Back pain after a fall tonight. History of hypertension, diabetes, former smoker. EXAM: CHEST  2 VIEW COMPARISON:  12/21/2013 FINDINGS: Cardiac enlargement. Pulmonary vascularity is normal. No airspace disease or consolidation in the lungs. Prominent apical cardiac fat pad. No pleural effusions. No pneumothorax. Mediastinal contours appear intact. Degenerative changes in the spine. IMPRESSION: Cardiac enlargement.  No evidence of active pulmonary disease. Electronically Signed   By: Lucienne Capers M.D.   On: 08/20/2017 22:20   Dg Lumbar Spine Complete  Result Date: 09/05/2017 CLINICAL DATA:  76 year old male with lumbar spine pain, altered mental status and confusion EXAM: LUMBAR SPINE - COMPLETE 4+ VIEW COMPARISON:  Prior lumbar spine radiographs 08/20/2017 FINDINGS: Slight interval progression of height loss of the L2 compression fracture  which is now greater than 50% centrally. No new compression fracture identified. Stable multilevel degenerative disc disease and advanced lower lumbar facet arthropathy. Stable mild grade 1 anterolisthesis of L4 on L5. The visualized bowel gas pattern is unremarkable. IMPRESSION: 1. Slight interval progression of height loss of the previously noted L2 compression fracture now representing greater than 50% height loss within the central aspect of the vertebral body. 2. No new fracture or malalignment. 3. Multilevel degenerative disc disease and lower lumbar facet arthropathy. Electronically Signed   By: Jacqulynn Cadet M.D.   On: 09/05/2017 12:52   Dg Lumbar Spine Complete  Result Date: 08/20/2017 CLINICAL DATA:  Low back pain radiating down to the legs after a fall. History of hypertension and diabetes. Former smoker. EXAM: LUMBAR SPINE - COMPLETE 4+ VIEW COMPARISON:  None. FINDINGS: There is acute appearing compression deformity of the L2 vertebra with about 30% loss of height. Cortical irregularities are demonstrated in the anterior vertebral margin and along the superior endplate. No retropulsion of fracture fragments. Normal alignment of the lumbar spine. Diffuse degenerative changes with narrowed interspaces and endplate hypertrophic changes throughout. Degenerative changes in the lumbar facet joints. No destructive or expansile bone lesions. Aortic calcification. IMPRESSION: Acute appearing compression fracture of the L2 vertebra with about 30% loss of height. No retropulsion of fracture fragments. Normal alignment. Electronically Signed   By: Lucienne Capers  M.D.   On: 08/20/2017 22:20   Ct Head Wo Contrast  Result Date: 08/20/2017 CLINICAL DATA:  Patient fell outside of home. No loss of consciousness. Posttraumatic headache. Drinking scotch. EXAM: CT HEAD WITHOUT CONTRAST TECHNIQUE: Contiguous axial images were obtained from the base of the skull through the vertex without intravenous contrast.  COMPARISON:  MRI brain 06/17/2013.  CT head 02/20/2009 FINDINGS: Brain: Mild diffuse cerebral atrophy. Mild ventricular dilatation consistent with central atrophy. Patchy low-attenuation changes in the deep white matter likely representing small vessel ischemia. No mass effect or midline shift. No abnormal extra-axial fluid collections. Gray-white matter junctions are distinct. Basal cisterns are not effaced. Mild prominence of cisterna magna. No acute intracranial hemorrhage. Vascular: Vascular calcifications demonstrated in the cervical carotid and vertebrobasilar arteries. Skull: The calvarium appears intact. Sinuses/Orbits: Mucosal thickening in the paranasal sinuses. No acute air-fluid levels. Mastoid air cells are not opacified. Other: None. IMPRESSION: No acute intracranial abnormalities. Mild chronic atrophy and small vessel ischemic changes. Electronically Signed   By: Lucienne Capers M.D.   On: 08/20/2017 22:31     CBC  Recent Labs Lab 09/05/17 1124 09/06/17 0428  WBC 11.0* 10.7*  HGB 16.6 14.7  HCT 47.6 44.4  PLT 332 293  MCV 92.8 94.9  MCH 32.4 31.4  MCHC 34.9 33.1  RDW 12.6 12.8  LYMPHSABS 1.1  --   MONOABS 0.8  --   EOSABS 0.1  --   BASOSABS 0.0  --     Chemistries   Recent Labs Lab 09/05/17 1124 09/06/17 0428  NA 138 139  K 2.7* 3.8  CL 97* 102  CO2 28 27  GLUCOSE 182* 120*  BUN 31* 28*  CREATININE 1.58* 1.39*  CALCIUM 9.3 8.7*  AST 25  --   ALT 23  --   ALKPHOS 118  --   BILITOT 0.8  --    ------------------------------------------------------------------------------------------------------------------ estimated creatinine clearance is 53 mL/min (A) (by C-G formula based on SCr of 1.39 mg/dL (H)). ------------------------------------------------------------------------------------------------------------------ No results for input(s): HGBA1C in the last 72  hours. ------------------------------------------------------------------------------------------------------------------ No results for input(s): CHOL, HDL, LDLCALC, TRIG, CHOLHDL, LDLDIRECT in the last 72 hours. ------------------------------------------------------------------------------------------------------------------ No results for input(s): TSH, T4TOTAL, T3FREE, THYROIDAB in the last 72 hours.  Invalid input(s): FREET3 ------------------------------------------------------------------------------------------------------------------ No results for input(s): VITAMINB12, FOLATE, FERRITIN, TIBC, IRON, RETICCTPCT in the last 72 hours.  Coagulation profile No results for input(s): INR, PROTIME in the last 168 hours.  No results for input(s): DDIMER in the last 72 hours.  Cardiac Enzymes No results for input(s): CKMB, TROPONINI, MYOGLOBIN in the last 168 hours.  Invalid input(s): CK ------------------------------------------------------------------------------------------------------------------ Invalid input(s): POCBNP   CBG:  Recent Labs Lab 09/05/17 2046 09/06/17 0754  GLUCAP 207* 103*       Studies: Dg Lumbar Spine Complete  Result Date: 09/05/2017 CLINICAL DATA:  76 year old male with lumbar spine pain, altered mental status and confusion EXAM: LUMBAR SPINE - COMPLETE 4+ VIEW COMPARISON:  Prior lumbar spine radiographs 08/20/2017 FINDINGS: Slight interval progression of height loss of the L2 compression fracture which is now greater than 50% centrally. No new compression fracture identified. Stable multilevel degenerative disc disease and advanced lower lumbar facet arthropathy. Stable mild grade 1 anterolisthesis of L4 on L5. The visualized bowel gas pattern is unremarkable. IMPRESSION: 1. Slight interval progression of height loss of the previously noted L2 compression fracture now representing greater than 50% height loss within the central aspect of the vertebral  body. 2. No new fracture or malalignment. 3. Multilevel degenerative  disc disease and lower lumbar facet arthropathy. Electronically Signed   By: Jacqulynn Cadet M.D.   On: 09/05/2017 12:52      Lab Results  Component Value Date   HGBA1C 6.9 (H) 03/15/2017   HGBA1C 7.1 (H) 11/12/2016   HGBA1C 6.6 07/08/2016   Lab Results  Component Value Date   MICROALBUR 41.1 (H) 03/15/2017   LDLCALC 102 (H) 03/15/2017   CREATININE 1.39 (H) 09/06/2017       Scheduled Meds: . atenolol  100 mg Oral Daily   And  . chlorthalidone  25 mg Oral Daily  . cyclobenzaprine  10 mg Oral TID  . diclofenac sodium  2 g Topical QID  . docusate sodium  100 mg Oral BID  . fentaNYL  50 mcg Transdermal Q72H  . heparin  5,000 Units Subcutaneous Q8H  . insulin aspart  0-9 Units Subcutaneous TID WC  . insulin glargine  15 Units Subcutaneous QHS  . losartan  100 mg Oral Daily  . pravastatin  40 mg Oral Daily   Continuous Infusions: . sodium chloride 75 mL/hr at 09/06/17 0804     LOS: 1 day    Time spent: >30 MINS    Reyne Dumas  [facility] Pager [phone]. If 7PM-7AM, please contact night-coverage at [email or web address], password [facility] 09/06/2017, 12:05 PM  LOS: 1 day

## 2017-09-06 NOTE — Progress Notes (Signed)
IR aware of request for KP/VP.  Patient will need an MRI of his lumbar spine to better evaluate fracture to determine candidacy of this procedure for him.  We will also go ahead and have insurance authorization completed so if he is a candidate we can plan for a time/date.  Clint Biello E 8:07 AM 09/06/2017

## 2017-09-06 NOTE — Evaluation (Signed)
Physical Therapy Evaluation Patient Details Name: Ryan Duke MRN: [id number] DOB: [birth year] Today's Date: 09/06/2017   History of Present Illness  pt is a 76 y/o male with pmh significant for DM2, HTN, recent fall with resultant L2 compression fracture. admitted with worsening low back pain, decreased ability to move, decreased oral intake, weight loss and confusion.  Clinical Impression  Pt admitted with/for severe LBP from L2 compression fx and worsening confusion.  Mobility needing at least min to mod  assist.  Pt currently limited functionally due to the problems listed below.  (see problems list.)  Pt will benefit from PT to maximize function and safety to be able to get home safely with available assist of family .     Follow Up Recommendations Home health PT;Supervision/Assistance - 24 hour    Equipment Recommendations  3in1 (PT);Other (comment) (tub seat)    Recommendations for Other Services       Precautions / Restrictions Precautions Precautions: Fall;Back Required Braces or Orthoses: Spinal Brace Spinal Brace: Thoracolumbosacral orthotic;Applied in sitting position      Mobility  Bed Mobility Overal bed mobility: Needs Assistance Bed Mobility: Rolling;Sidelying to Sit Rolling: Min assist Sidelying to sit: Mod assist;+2 for safety/equipment       General bed mobility comments: cues for technique, truncal assist to roll and help up to sitting  Transfers Overall transfer level: Needs assistance Equipment used: Rolling walker (2 wheeled) Transfers: Sit to/from Stand Sit to Stand: Min assist;+2 physical assistance;+2 safety/equipment         General transfer comment: cues for hand placement,  assist to come forward, stability assist  Ambulation/Gait Ambulation/Gait assistance: Min assist;+2 safety/equipment Ambulation Distance (Feet): 15 Feet (x2) Assistive device: Rolling walker (2 wheeled) Gait Pattern/deviations: Step-through pattern   Gait  velocity interpretation: Below normal speed for age/gender General Gait Details: general unsteadiness overall with uncoordinated use of the RW.  Cues for improved AD use and minimal steady assist.  Stairs            Wheelchair Mobility    Modified Rankin (Stroke Patients Only)       Balance Overall balance assessment: Needs assistance Sitting-balance support: No upper extremity supported Sitting balance-Leahy Scale: Fair       Standing balance-Leahy Scale: Poor Standing balance comment: moderately heavy use of the RW                             Pertinent Vitals/Pain Pain Assessment: 0-10 Pain Score: 6  Pain Location: back and legs Pain Descriptors / Indicators: Aching;Grimacing;Guarding;Sore Pain Intervention(s): Limited activity within patient's tolerance;Monitored during session;Premedicated before session    [place] expects to be discharged to:: Private residence Living Arrangements: Spouse/significant other Available Help at Discharge: Family;Available 24 hours/day Type of Home: House Home Access: Stairs to enter Entrance Stairs-Rails: Psychiatric nurse of Steps: several Home Layout: Two level;Bed/bath upstairs Home Equipment: Walker - 2 wheels      Prior Function Level of Independence: Independent         Comments: usually independent, drives, runs errands.     Hand Dominance   Dominant Hand: Right    Extremity/Trunk Assessment   Upper Extremity Assessment Upper Extremity Assessment: Defer to OT evaluation    Lower Extremity Assessment Lower Extremity Assessment: Overall WFL for tasks assessed;Generalized weakness       Communication   Communication: No difficulties  Cognition Arousal/Alertness: Awake/alert Behavior During Therapy: WFL for tasks assessed/performed Overall  Cognitive Status: Within Functional Limits for tasks assessed (still having mild processing issues)                                         General Comments General comments (skin integrity, edema, etc.): pt/wift instructed in back care/prec,     Exercises     Assessment/Plan    PT Assessment Patient needs continued PT services  PT Problem List Decreased strength;Decreased activity tolerance;Decreased balance;Decreased mobility;Decreased knowledge of use of DME;Decreased knowledge of precautions;Pain       PT Treatment Interventions Gait training;DME instruction;Stair training;Functional mobility training;Therapeutic activities;Balance training;Patient/family education    PT Goals (Current goals can be found in the Care Plan section)  Acute Rehab PT Goals Patient Stated Goal: Get rid of some of this pain, move better. PT Goal Formulation: With patient Time For Goal Achievement: 09/20/17 Potential to Achieve Goals: Good    Frequency Min 4X/week   Barriers to discharge        Co-evaluation               AM-PAC PT "6 Clicks" Daily Activity  Outcome Measure Difficulty turning over in bed (including adjusting bedclothes, sheets and blankets)?: Unable Difficulty moving from lying on back to sitting on the side of the bed? : Unable Difficulty sitting down on and standing up from a chair with arms (e.g., wheelchair, bedside commode, etc,.)?: Unable Help needed moving to and from a bed to chair (including a wheelchair)?: A Little Help needed walking in hospital room?: A Little Help needed climbing 3-5 steps with a railing? : A Lot 6 Click Score: 11    End of Session   Activity Tolerance: Patient tolerated treatment well;Patient limited by pain Patient left: in chair;with call bell/phone within reach;with family/visitor present Nurse Communication: Mobility status PT Visit Diagnosis: Unsteadiness on feet (R26.81);Muscle weakness (generalized) (M62.81);Difficulty in walking, not elsewhere classified (R26.2);Pain Pain - part of body:  (back)    Time: 0272-5366 PT Time  Calculation (min) (ACUTE ONLY): 37 min   Charges:   PT Evaluation $PT Eval Moderate Complexity: 1 Mod     PT G Codes:        2017-09-10  Donnella Sham, PT [phone]  (pager)  Ryan Duke 09/10/2017, 3:31 PM

## 2017-09-06 NOTE — Progress Notes (Signed)
*  PRELIMINARY RESULTS* Vascular Ultrasound Lower ext venous duplex has been completed.  Preliminary findings: No evidence of DVT or baker's cyst.  Of note, Bilateral pedal artery waveforms are within normal limits, indicating adequate arterial flow.   Spoke with Dr. Allyson Sabal regarding these results.   Landry Mellow, RDMS, RVT  09/06/2017, 12:02 PM

## 2017-09-06 NOTE — Evaluation (Signed)
Occupational Therapy Evaluation Patient Details Name: Ryan Duke MRN: [id number] DOB: 04-03-1941 Today's Date: 09/06/2017    History of Present Illness pt is a 76 y/o male with pmh significant for DM2, HTN, recent fall with resultant L2 compression fracture. admitted with worsening low back pain, decreased ability to move, decreased oral intake, weight loss and confusion.   Clinical Impression   This 76 y/o M presents with the above. At baseline Pt is independent with ADLs and functional mobility. Pt currently requires Min-Mod+2 assist for functional mobility at RW level, MaxA for LB ADLs secondary to adhering to back precautions, with limitations including pain, general weakness, and min cognitive deficits. Pt will benefit from continued acute OT services and recommend further [facility] services after discharge to maximize Pt's safety and independence with ADLs and functional mobility.     Follow Up Recommendations  Home health OT;Supervision/Assistance - 24 hour    Equipment Recommendations  3 in 1 bedside commode           Precautions / Restrictions Precautions Precautions: Fall;Back Required Braces or Orthoses: Spinal Brace Spinal Brace: Thoracolumbosacral orthotic;Applied in sitting position      Mobility Bed Mobility Overal bed mobility: Needs Assistance Bed Mobility: Rolling;Sidelying to Sit Rolling: Min assist Sidelying to sit: Mod assist;+2 for safety/equipment       General bed mobility comments: cues for technique, truncal assist to roll and help up to sitting  Transfers Overall transfer level: Needs assistance Equipment used: Rolling walker (2 wheeled) Transfers: Sit to/from Stand Sit to Stand: Min assist;+2 physical assistance;+2 safety/equipment         General transfer comment: cues for hand placement,  assist to come forward, stability assist    Balance Overall balance assessment: Needs assistance Sitting-balance support: No upper extremity  supported Sitting balance-Leahy Scale: Fair       Standing balance-Leahy Scale: Poor Standing balance comment: moderately heavy use of the RW                           ADL either performed or assessed with clinical judgement   ADL Overall ADL's : Needs assistance/impaired Eating/Feeding: Set up;Sitting   Grooming: Set up;Sitting   Upper Body Bathing: Min guard;Sitting   Lower Body Bathing: Maximal assistance;Sit to/from stand;+2 for safety/equipment   Upper Body Dressing : Minimal assistance;Sitting;Moderate assistance Upper Body Dressing Details (indicate cue type and reason): ModA to don spinal brace with verbal cues to complete  Lower Body Dressing: Maximal assistance;Sit to/from stand;+2 for safety/equipment;+2 for physical assistance   Toilet Transfer: Moderate assistance;Ambulation;BSC;RW;+2 for physical assistance;+2 for safety/equipment Toilet Transfer Details (indicate cue type and reason): BSC over toilet  Toileting- Clothing Manipulation and Hygiene: Moderate assistance;Sit to/from stand       Functional mobility during ADLs: Moderate assistance;+2 for physical assistance;+2 for safety/equipment;Rolling walker       Vision Baseline Vision/History: Wears glasses Additional Comments: Pt was recently supposed to have eye surgery, missed this due to recent events                 Pertinent Vitals/Pain Pain Assessment: 0-10 Pain Score: 6  Pain Location: back and legs Pain Descriptors / Indicators: Aching;Grimacing;Guarding;Sore Pain Intervention(s): Limited activity within patient's tolerance;Monitored during session;Premedicated before session     Hand Dominance Right   Extremity/Trunk Assessment Upper Extremity Assessment Upper Extremity Assessment: Generalized weakness   Lower Extremity Assessment Lower Extremity Assessment: Defer to PT evaluation       Communication  Communication Communication: No difficulties   Cognition  Arousal/Alertness: Awake/alert Behavior During Therapy: WFL for tasks assessed/performed Overall Cognitive Status: Within Functional Limits for tasks assessed (still having mild processing/orientation issues, min verbal cues for safety )                                     General Comments  Pt/wife instructed in back care/precautions                Home Living Family/patient expects to be discharged to:: Private residence Living Arrangements: Spouse/significant other Available Help at Discharge: Family;Available 24 hours/day Type of Home: House Home Access: Stairs to enter [employer] of Steps: several Entrance Stairs-Rails: Right;Left Home Layout: Two level;Bed/bath upstairs Alternate Level Stairs-Number of Steps: flight Alternate Level Stairs-Rails: Right;Left Bathroom Shower/Tub: Tub/shower unit;Curtain   Bathroom Toilet: Standard     Home Equipment: Environmental consultant - 2 wheels          Prior Functioning/Environment Level of Independence: Independent        Comments: usually independent, drives, runs errands.        OT Problem List: Decreased strength;Impaired balance (sitting and/or standing);Decreased activity tolerance;Decreased knowledge of use of DME or AE;Decreased knowledge of precautions;Pain;Decreased safety awareness;Decreased cognition      OT Treatment/Interventions: Self-care/ADL training;DME and/or AE instruction;Therapeutic activities;Balance training;Therapeutic exercise;Manual therapy;Energy conservation;Patient/family education    OT Goals(Current goals can be found in the care plan section) Acute Rehab OT Goals Patient Stated Goal: Get rid of some of this pain, move better. OT Goal Formulation: With patient Time For Goal Achievement: 09/20/17 Potential to Achieve Goals: Good  OT Frequency: Min 2X/week               Co-evaluation PT/OT/SLP Co-Evaluation/Treatment: Yes Reason for Co-Treatment: Complexity of the  patient's impairments (multi-system involvement);For patient/therapist safety;To address functional/ADL transfers PT goals addressed during session: Mobility/safety with mobility OT goals addressed during session: ADL's and self-care      AM-PAC PT "6 Clicks" Daily Activity     Outcome Measure Help from another person eating meals?: None Help from another person taking care of personal grooming?: A Little Help from another person toileting, which includes using toliet, bedpan, or urinal?: A Lot Help from another person bathing (including washing, rinsing, drying)?: A Lot Help from another person to put on and taking off regular upper body clothing?: A Little Help from another person to put on and taking off regular lower body clothing?: A Lot 6 Click Score: 16   End of Session Equipment Utilized During Treatment: Back brace;Rolling walker Nurse Communication: Mobility status  Activity Tolerance: Patient tolerated treatment well Patient left: in chair;with call bell/phone within reach;with chair alarm set  OT Visit Diagnosis: Unsteadiness on feet (R26.81);Pain;Other abnormalities of gait and mobility (R26.89);Muscle weakness (generalized) (M62.81);Other symptoms and signs involving cognitive function Pain - part of body:  (back, radiating to LEs)                Time: 9983-3825 OT Time Calculation (min): 37 min Charges:  OT General Charges $OT Visit: 1 Visit OT Evaluation $OT Eval Moderate Complexity: 1 Mod G-Codes:     Lou Cal, OT Pager [phone] 09/06/2017   Raymondo Band 09/06/2017, 4:27 PM

## 2017-09-07 DIAGNOSIS — S32020K Wedge compression fracture of second lumbar vertebra, subsequent encounter for fracture with nonunion: Secondary | ICD-10-CM

## 2017-09-07 LAB — GLUCOSE, CAPILLARY
GLUCOSE-CAPILLARY: 132 mg/dL — AB (ref 65–99)
GLUCOSE-CAPILLARY: 158 mg/dL — AB (ref 65–99)
Glucose-Capillary: 89 mg/dL (ref 65–99)
Glucose-Capillary: 101 mg/dL — ABNORMAL HIGH (ref 65–99)

## 2017-09-07 LAB — TROPONIN I: Troponin I: 0.04 ng/mL (ref <0.03)

## 2017-09-07 NOTE — Progress Notes (Signed)
Chief Complaint: Patient was seen in consultation today for L2 compression fracture at the request of Dr. Allyson Sabal  Referring Physician(s): Dr. Allyson Sabal  Supervising Physician: Sandi Mariscal  Patient Status: [facility]  History of Present Illness: Ryan Duke is a 76 y.o. male with findings of a symptomatic L2 compression fracture. He fell about 3 weeks ago at home and developed mid-low back pain shortly after. He was seen by neurology as an outpt and given a back brace and po pain medications. He developed some side effects from the pain meds and his pain has not improved at all, and in fact worsened. He has now been admitted for the past 2 days with intractable back pain severely limiting his activity. He does reports some neuropathic sxs in to his legs as well, particularly when standing. Denies bowel or bladder dysfunction. IR is consulted to consider vertebroplasty as a treatment option. MRI was ordered. Wife at bedside  Past Medical History:  Diagnosis Date  . Diabetes mellitus    type 2  . Hyperlipidemia   . Hypertension   . Hypogonadism, male   . Pancreatitis 2008  . Renal insufficiency     History reviewed. No pertinent surgical history.  Allergies: Farxiga [dapagliflozin]; Metformin and related; Atorvastatin; and Benazepril hcl  Medications:  Current Facility-Administered Medications:  .  0.9 %  sodium chloride infusion, , Intravenous, Continuous, Abrol, Nayana, MD, Last Rate: 50 mL/hr at 09/07/17 1107 .  acetaminophen (TYLENOL) tablet 650 mg, 650 mg, Oral, Q6H PRN **OR** acetaminophen (TYLENOL) suppository 650 mg, 650 mg, Rectal, Q6H PRN, Arrien, Jimmy Picket, MD .  atenolol (TENORMIN) tablet 100 mg, 100 mg, Oral, Daily, 100 mg at 09/07/17 0905 **AND** chlorthalidone (HYGROTON) tablet 25 mg, 25 mg, Oral, Daily, Arrien, Jimmy Picket, MD, 25 mg at 09/07/17 0905 .  cyclobenzaprine (FLEXERIL) tablet 10 mg, 10 mg, Oral, TID, Arrien, Jimmy Picket, MD, 10  mg at 09/07/17 0905 .  diclofenac sodium (VOLTAREN) 1 % transdermal gel 2 g, 2 g, Topical, QID, Arrien, Jimmy Picket, MD, 2 g at 09/07/17 0906 .  docusate sodium (COLACE) capsule 100 mg, 100 mg, Oral, BID, Arrien, Jimmy Picket, MD, 100 mg at 09/07/17 0905 .  fentaNYL (DURAGESIC - dosed mcg/hr) patch 50 mcg, 50 mcg, Transdermal, Q72H, Mesner, Corene Cornea, MD, 50 mcg at 09/05/17 1620 .  heparin injection 5,000 Units, 5,000 Units, Subcutaneous, Q8H, Arrien, Jimmy Picket, MD, 5,000 Units at 09/07/17 0550 .  hydrALAZINE (APRESOLINE) injection 10 mg, 10 mg, Intravenous, Q6H PRN, Abrol, Nayana, MD .  insulin aspart (novoLOG) injection 0-9 Units, 0-9 Units, Subcutaneous, TID WC, Arrien, Jimmy Picket, MD, 1 Units at 09/07/17 1225 .  insulin glargine (LANTUS) injection 15 Units, 15 Units, Subcutaneous, QHS, Arrien, Jimmy Picket, MD, 15 Units at 09/06/17 2153 .  losartan (COZAAR) tablet 100 mg, 100 mg, Oral, Daily, Arrien, Jimmy Picket, MD, 100 mg at 09/07/17 0905 .  morphine 4 MG/ML injection 1 mg, 1 mg, Intravenous, Q2H PRN, Arrien, Jimmy Picket, MD, 1 mg at 09/06/17 1836 .  ondansetron (ZOFRAN) tablet 4 mg, 4 mg, Oral, Q6H PRN **OR** ondansetron (ZOFRAN) injection 4 mg, 4 mg, Intravenous, Q6H PRN, Arrien, Jimmy Picket, MD, 4 mg at 09/05/17 2135 .  oxyCODONE-acetaminophen (PERCOCET/ROXICET) 5-325 MG per tablet 1 tablet, 1 tablet, Oral, QID PRN, Arrien, Jimmy Picket, MD, 1 tablet at 09/06/17 2152 .  pravastatin (PRAVACHOL) tablet 40 mg, 40 mg, Oral, Daily, Arrien, Jimmy Picket, MD, 40 mg at 09/07/17 0905 .  sodium chloride flush (NS)  0.9 % injection 3 mL, 3 mL, Intravenous, PRN, Arrien, Jimmy Picket, MD    Family History  Problem Relation Age of Onset  . Hypertension Other     Social History   Social History  . Marital status: Married    Spouse name: N/A  . Number of children: N/A  . Years of education: N/A   Occupational History  . retired    Social History Main  Topics  . Smoking status: Former Smoker    Quit date: 11/30/1974  . Smokeless tobacco: Never Used  . Alcohol use 12.6 oz/week    21 Shots of liquor per week  . Drug use: No  . Sexual activity: Yes   Other Topics Concern  . None   Social History Narrative   Regular exercise-yes     Review of Systems: A 12 point ROS discussed and pertinent positives are indicated in the HPI above.  All other systems are negative.  Review of Systems  Vital Signs: BP (!) 146/86 (BP Location: Left Arm)   Pulse 66   Temp 98.1 F (36.7 C) (Oral)   Resp 17   Ht 5\' 11"  (1.803 m)   Wt 222 lb 0.1 oz (100.7 kg)   SpO2 96%   BMI 30.96 kg/m   Physical Exam  Constitutional: He is oriented to person, place, and time. He appears well-developed. No distress.  HENT:  Head: Normocephalic.  Mouth/Throat: Oropharynx is clear and moist.  Neck: Normal range of motion. No JVD present. No tracheal deviation present.  Cardiovascular: Normal rate, regular rhythm and normal heart sounds.   Pulmonary/Chest: Effort normal and breath sounds normal. No respiratory distress.  Abdominal: Soft. There is no tenderness. There is no guarding.  Musculoskeletal:  Tender about the L2 paraspinous region, no palpable defect  Neurological: He is alert and oriented to person, place, and time. He displays normal reflexes. He exhibits normal muscle tone.  Skin: Skin is warm and dry.  Psychiatric: He has a normal mood and affect.    Imaging: Dg Chest 2 View  Result Date: 08/20/2017 CLINICAL DATA:  Back pain after a fall tonight. History of hypertension, diabetes, former smoker. EXAM: CHEST  2 VIEW COMPARISON:  12/21/2013 FINDINGS: Cardiac enlargement. Pulmonary vascularity is normal. No airspace disease or consolidation in the lungs. Prominent apical cardiac fat pad. No pleural effusions. No pneumothorax. Mediastinal contours appear intact. Degenerative changes in the spine. IMPRESSION: Cardiac enlargement.  No evidence of active  pulmonary disease. Electronically Signed   By: Lucienne Capers M.D.   On: 08/20/2017 22:20   Dg Lumbar Spine Complete  Result Date: 09/05/2017 CLINICAL DATA:  76 year old male with lumbar spine pain, altered mental status and confusion EXAM: LUMBAR SPINE - COMPLETE 4+ VIEW COMPARISON:  Prior lumbar spine radiographs 08/20/2017 FINDINGS: Slight interval progression of height loss of the L2 compression fracture which is now greater than 50% centrally. No new compression fracture identified. Stable multilevel degenerative disc disease and advanced lower lumbar facet arthropathy. Stable mild grade 1 anterolisthesis of L4 on L5. The visualized bowel gas pattern is unremarkable. IMPRESSION: 1. Slight interval progression of height loss of the previously noted L2 compression fracture now representing greater than 50% height loss within the central aspect of the vertebral body. 2. No new fracture or malalignment. 3. Multilevel degenerative disc disease and lower lumbar facet arthropathy. Electronically Signed   By: Jacqulynn Cadet M.D.   On: 09/05/2017 12:52   Dg Lumbar Spine Complete  Result Date: 08/20/2017 CLINICAL DATA:  Low back pain radiating down to the legs after a fall. History of hypertension and diabetes. Former smoker. EXAM: LUMBAR SPINE - COMPLETE 4+ VIEW COMPARISON:  None. FINDINGS: There is acute appearing compression deformity of the L2 vertebra with about 30% loss of height. Cortical irregularities are demonstrated in the anterior vertebral margin and along the superior endplate. No retropulsion of fracture fragments. Normal alignment of the lumbar spine. Diffuse degenerative changes with narrowed interspaces and endplate hypertrophic changes throughout. Degenerative changes in the lumbar facet joints. No destructive or expansile bone lesions. Aortic calcification. IMPRESSION: Acute appearing compression fracture of the L2 vertebra with about 30% loss of height. No retropulsion of fracture  fragments. Normal alignment. Electronically Signed   By: Lucienne Capers M.D.   On: 08/20/2017 22:20   Ct Head Wo Contrast  Result Date: 08/20/2017 CLINICAL DATA:  Patient fell outside of home. No loss of consciousness. Posttraumatic headache. Drinking scotch. EXAM: CT HEAD WITHOUT CONTRAST TECHNIQUE: Contiguous axial images were obtained from the base of the skull through the vertex without intravenous contrast. COMPARISON:  MRI brain 06/17/2013.  CT head 02/20/2009 FINDINGS: Brain: Mild diffuse cerebral atrophy. Mild ventricular dilatation consistent with central atrophy. Patchy low-attenuation changes in the deep white matter likely representing small vessel ischemia. No mass effect or midline shift. No abnormal extra-axial fluid collections. Gray-white matter junctions are distinct. Basal cisterns are not effaced. Mild prominence of cisterna magna. No acute intracranial hemorrhage. Vascular: Vascular calcifications demonstrated in the cervical carotid and vertebrobasilar arteries. Skull: The calvarium appears intact. Sinuses/Orbits: Mucosal thickening in the paranasal sinuses. No acute air-fluid levels. Mastoid air cells are not opacified. Other: None. IMPRESSION: No acute intracranial abnormalities. Mild chronic atrophy and small vessel ischemic changes. Electronically Signed   By: Lucienne Capers M.D.   On: 08/20/2017 22:31   Mr Lumbar Spine Wo Contrast  Result Date: 09/06/2017 CLINICAL DATA:  L2 compression fracture EXAM: MRI LUMBAR SPINE WITHOUT CONTRAST TECHNIQUE: Multiplanar, multisequence MR imaging of the lumbar spine was performed. No intravenous contrast was administered. COMPARISON:  Lumbar spine radiograph 09/05/2017, 09/02/2017 and 08/20/2017 FINDINGS: Segmentation:  Standard. Alignment:  Physiologic. Vertebrae: Burst fracture of L2 involving both endplates and both walls with approximately 50% central height loss. No retropulsion. There is a small fluid-filled cleft anteriorly. Mild  marrow edema. No other acute marrow abnormality. No ligamentous disruption. Conus medullaris: Extends to the T12 level and appears normal. Paraspinal and other soft tissues: Negative. Disc levels: Above the T12 level, there is no disc herniation, spinal canal stenosis or neuroforaminal stenosis. T12-L1: Normal disc space and facets. No spinal canal or neuroforaminal stenosis. L1-L2: Normal disc space and facets. No spinal canal or neuroforaminal stenosis. L2-L3: Fluid within both facets. Small disc bulge with mild spinal canal stenosis. Bilateral moderate neural foraminal stenosis. L3-L4: Severe right and mild left facet hypertrophy. Small diffuse disc bulge. Moderate bilateral neural foraminal stenosis. No spinal canal stenosis. L4-L5: Severe bilateral facet hypertrophy with medium-sized diffuse disc bulge. No central spinal canal stenosis. Mild narrowing of both lateral recesses. Moderate bilateral neural foraminal stenosis. L5-S1: Minimal disc bulge and mild facet hypertrophy. No spinal canal stenosis or neural foraminal stenosis. Visualized sacrum: Normal. IMPRESSION: 1. Subacute L2 burst fracture, which involves both endplates and both walls. No retropulsion. Small anterior osteonecrotic cleft. 2. Moderate bilateral neural foraminal stenosis at the L2-L5 levels due to combination of disc bulge and facet arthrosis. 3. 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 Widened, fluid-filled facet joint spaces at this level may  indicate a degree of segmental instability. Additionally, facet disease may serve as a source of local low back pain. Electronically Signed   By: Ulyses Jarred M.D.   On: 09/06/2017 22:31    Labs:  CBC:  Recent Labs  03/15/17 1002 08/20/17 2255 09/05/17 1124 09/06/17 0428  WBC 7.7 12.4* 11.0* 10.7*  HGB 16.0 14.4 16.6 14.7  HCT 47.4 44.6 47.6 44.4  PLT 238.0 201 332 293    COAGS: No results for input(s): INR, APTT in the last 8760 hours.  BMP:  Recent  Labs  03/15/17 1002 08/20/17 2255 09/05/17 1124 09/06/17 0428  NA 137 138 138 139  K 3.8 3.0* 2.7* 3.8  CL 101 103 97* 102  CO2 29 22 28 27   GLUCOSE 161* 128* 182* 120*  BUN 24* 19 31* 28*  CALCIUM 9.7 8.9 9.3 8.7*  CREATININE 1.41 1.47* 1.58* 1.39*  GFRNONAA  --  44* 40* 47*  GFRAA  --  51* 47* 54*    LIVER FUNCTION TESTS:  Recent Labs  11/12/16 1044 03/15/17 1002 08/20/17 2255 09/05/17 1124  BILITOT 0.5 0.7 0.8 0.8  AST 19 19 26 25   ALT 20 21 24 23   ALKPHOS 50 48 49 118  PROT 6.7 7.2 6.4* 7.1  ALBUMIN 4.1 4.2 3.8 3.9    TUMOR MARKERS: No results for input(s): AFPTM, CEA, CA199, CHROMGRNA in the last 8760 hours.  Assessment and Plan: Symptomatic L2 compression fracture. MRI reviewed by Dr. Pascal Lux. No contraindications to VP/KP Procedure discussed as an option with pt and wife, who agree to proceed. Risks and benefits of VP/KP were discussed with the patient including, but not limited to education regarding the natural healing process of compression fractures without intervention, bleeding, infection, cement migration which may cause spinal cord damage, paralysis, pulmonary embolism or even death.  Procedure submitted to pt Insurance for authorization. Initial response is that it may take up to 4-5 days for approval. Will try to coordinate scheduling as inpt. Doubt pt would be able to go home due to pain and immobility.  This interventional procedure involves the use of X-rays and because of the nature of the planned procedure, it is possible that we will have prolonged use of X-ray fluoroscopy.  Potential radiation risks to you include (but are not limited to) the following: - A slightly elevated risk for cancer  several years later in life. This risk is typically less than 0.5% percent. This risk is low in comparison to the normal incidence of human cancer, which is 33% for women and 50% for men according to the [place]. - Radiation induced injury  can include skin redness, resembling a rash, tissue breakdown / ulcers and hair loss (which can be temporary or permanent).   The likelihood of either of these occurring depends on the difficulty of the procedure and whether you are sensitive to radiation due to previous procedures, disease, or genetic conditions.   IF your procedure requires a prolonged use of radiation, you will be notified and given written instructions for further action.  It is your responsibility to monitor the irradiated area for the 2 weeks following the procedure and to notify your physician if you are concerned that you have suffered a radiation induced injury.    All of the patient's questions were answered, patient is agreeable to proceed.   Thank you for this interesting consult.  I greatly enjoyed meeting Ryan Duke and look forward to participating in their care.  A  copy of this report was sent to the requesting provider on this date.  Electronically Signed: Ascencion Dike, PA-C 09/07/2017, 12:05 PM   I spent a total of 30 minutes in face to face in clinical consultation, greater than 50% of which was counseling/coordinating care for L2 compression fracture

## 2017-09-07 NOTE — Progress Notes (Signed)
PT Cancellation Note  Patient Details Name: Ryan Duke MRN: [id number] DOB: [birth year]   Cancelled Treatment:     pt plans to have Kyphoplasty for L2 compression Fx.  Will continue to follow.   Rica Koyanagi  PTA [facility]  Acute  Rehab Pager      [phone]

## 2017-09-07 NOTE — Progress Notes (Signed)
Pt's telemetry showed afib, this RN completed an EKG and notified on call [facility]. Pt's vital signs stable and pt asymptomatic. Will continue to monitor pt closely.

## 2017-09-07 NOTE — Progress Notes (Signed)
CRITICAL VALUE ALERT  Critical Value:Troponin .04  Date & Time Notied:  09/07/17 1245  Provider Notified: Allyson Sabal  Orders Received/Actions taken:  Continue to monitor

## 2017-09-07 NOTE — Progress Notes (Addendum)
[facility] PROGRESS NOTE  Ryan Duke BMW:[id number] DOB: 1941-06-13 DOA: 09/05/2017   PCP: Janith Lima, MD     Assessment/Plan: Active Problems:   Back pain   Closed compression fracture of L2 lumbar vertebra ([place])   76 year old male who presented with back pain and confusion, recent diagnosis of L2 compression fracture, he has been on narcotics with persistent pain, now complicated with metabolic encephalopathy, and significant ambulatory dysfunction. On his initial physical examination, blood pressure 169/93, heart rate 78, respiratory rate 20, oxygen saturation 90% on room air, temperature 97.9. Dry mucous membranes, lungs are clear to auscultation bilaterally, heart S1-S2 present rhythmic, the abdomen is protuberant, nontender, nondistended, no lower extremity edema, range of motion of her back is limited due to pain. Sodium 138, potassium 2.7, chloride 97, bicarbonate 28, glucose 182, BUN 31, creatinine 1.52, AST 25, ALT 23, white count 11.0, hemoglobin 16.6, hematocrit 47.6, , platelets 332. Urinalysis negative for infection. Lumbar x-rays with L2 compression fracture now representing greater than 50% height loss within the central aspect of the vertebral body. No new fracture malalignment, multilevel degenerative disc disease in lower lumbar facet atrophy.  Patient will be admitted to the hospital working diagnosis of acute on chronic back pain due to worsening L2 compression fracture, complicated by metabolic encephalopathy and hypokalemia. Now awaiting kyphoplasty  Assessment and plan  1. Acute on chronic back pain. Patient known to have compression fracture, L2, imaging shown worsening height loss now greater than 50%. Emergency department staff discussed the case with neurosurgery, recommend medical management.  Supportive therapy, morphine, IV fluids, interventional radiology has been consulted to explore options for kyphoplasty. Insurance authorization and MRI  lumbar spine showed subacute L2 burst fracture  .  Patient has failed outpatient treatment on oral medications and has had unsubstantiated progress with his symptoms in the outpatient setting . Vascular studies ordered due to mottling of skin of the lower extremities. Venous Doppler shows no DVT, normal bilateral waveforms showing normal arterial flow   2. Metabolic encephalopathy. Likely triggered by uncontrolled pain, and poor oral intake. Narcotics seems to be exacerbating his confusion. Currently no agitation. Will continue aggressive pain control, neurochecks every 4 hours, hydration with IV fluids.   3. Chronic kidney disease stage 3 with Hypokalemia. Hypokalemia likely related to poor oral intake combined with diuretic use, patient has received 60 mEq of potassium chloride in the emergency department. Will follow-up kidney function morning.  4. Hypertension. Will continue blood pressure regimen with atenolol/chlorthalidone, and losartan. Blood pressure elevated on admission.   5. Type 2 diabetes mellitus. Will continue glucose coverage and monitoring with insulin sliding-scale. Basal insulin with insulin glargine, decrease dose to 15 units, due to risk of hypoglycemia in the setting of poor oral intake. Hold on oral hypoglycemic agent/Januvia.  6. Dyslipidemia. Continue pravastatin.  7. Hypokalemia could be related to chlorthalidone, now repleted  8. PAC's doubt paroxysmal afib, continue atenolol     DVT prophylaxsis heparin  Code Status:   Full code     Family Communication: Discussed in detail with the patient, all imaging results, lab results explained to the patient   Disposition Plan:   1-2 days     Consultants:  Interventional radiology  Procedures:  None    Antibiotics: Anti-infectives    None         HPI/Subjective: Patient feels that not much is being achieved, agitated and frustrated. He still has low back pain. Explained to the patient that  we  are awaiting insurance authorization  Objective: Vitals:   09/06/17 1839 09/06/17 2050 09/07/17 0048 09/07/17 0438  BP: (!) 141/119 (!) 163/86 (!) 154/82 (!) 146/86  Pulse:  73 67 66  Resp:  20 18 17   Temp:  97.8 F (36.6 C) 97.6 F (36.4 C) 98.1 F (36.7 C)  TempSrc:  Oral Oral Oral  SpO2:  98% 96% 96%  Weight:      Height:        Intake/Output Summary (Last 24 hours) at 09/07/17 1106 Last data filed at 09/07/17 1103  Gross per 24 hour  Intake          1531.25 ml  Output             1025 ml  Net           506.25 ml    Exam:  Examination:  General exam:somewhat agitated  Respiratory system: Clear to auscultation. Respiratory effort normal. Cardiovascular system: S1 & S2 heard, RRR. No JVD, murmurs, rubs, gallops or clicks. No pedal edema. Gastrointestinal system: Abdomen is nondistended, soft and nontender. No organomegaly or masses felt. Normal bowel sounds heard. Central nervous system: Alert and oriented. No focal neurological deficits.strength intact in bilateral lower extremities Extremities:  Bilateral lower extremity pain with mottled appearance of the skin of both legs Skin: No rashes, lesions or ulcers Psychiatry: Judgement and insight appear normal. Mood & affect appropriate.     Data Reviewed: I have personally reviewed following labs and imaging studies  Micro Results No results found for this or any previous visit (from the past 240 hour(s)).  Radiology Reports Dg Chest 2 View  Result Date: 08/20/2017 CLINICAL DATA:  Back pain after a fall tonight. History of hypertension, diabetes, former smoker. EXAM: CHEST  2 VIEW COMPARISON:  12/21/2013 FINDINGS: Cardiac enlargement. Pulmonary vascularity is normal. No airspace disease or consolidation in the lungs. Prominent apical cardiac fat pad. No pleural effusions. No pneumothorax. Mediastinal contours appear intact. Degenerative changes in the spine. IMPRESSION: Cardiac enlargement.  No evidence of active  pulmonary disease. Electronically Signed   By: Lucienne Capers M.D.   On: 08/20/2017 22:20   Dg Lumbar Spine Complete  Result Date: 09/05/2017 CLINICAL DATA:  76 year old male with lumbar spine pain, altered mental status and confusion EXAM: LUMBAR SPINE - COMPLETE 4+ VIEW COMPARISON:  Prior lumbar spine radiographs 08/20/2017 FINDINGS: Slight interval progression of height loss of the L2 compression fracture which is now greater than 50% centrally. No new compression fracture identified. Stable multilevel degenerative disc disease and advanced lower lumbar facet arthropathy. Stable mild grade 1 anterolisthesis of L4 on L5. The visualized bowel gas pattern is unremarkable. IMPRESSION: 1. Slight interval progression of height loss of the previously noted L2 compression fracture now representing greater than 50% height loss within the central aspect of the vertebral body. 2. No new fracture or malalignment. 3. Multilevel degenerative disc disease and lower lumbar facet arthropathy. Electronically Signed   By: Jacqulynn Cadet M.D.   On: 09/05/2017 12:52   Dg Lumbar Spine Complete  Result Date: 08/20/2017 CLINICAL DATA:  Low back pain radiating down to the legs after a fall. History of hypertension and diabetes. Former smoker. EXAM: LUMBAR SPINE - COMPLETE 4+ VIEW COMPARISON:  None. FINDINGS: There is acute appearing compression deformity of the L2 vertebra with about 30% loss of height. Cortical irregularities are demonstrated in the anterior vertebral margin and along the superior endplate. No retropulsion of fracture fragments. Normal alignment of the lumbar  spine. Diffuse degenerative changes with narrowed interspaces and endplate hypertrophic changes throughout. Degenerative changes in the lumbar facet joints. No destructive or expansile bone lesions. Aortic calcification. IMPRESSION: Acute appearing compression fracture of the L2 vertebra with about 30% loss of height. No retropulsion of fracture  fragments. Normal alignment. Electronically Signed   By: Lucienne Capers M.D.   On: 08/20/2017 22:20   Ct Head Wo Contrast  Result Date: 08/20/2017 CLINICAL DATA:  Patient fell outside of home. No loss of consciousness. Posttraumatic headache. Drinking scotch. EXAM: CT HEAD WITHOUT CONTRAST TECHNIQUE: Contiguous axial images were obtained from the base of the skull through the vertex without intravenous contrast. COMPARISON:  MRI brain 06/17/2013.  CT head 02/20/2009 FINDINGS: Brain: Mild diffuse cerebral atrophy. Mild ventricular dilatation consistent with central atrophy. Patchy low-attenuation changes in the deep white matter likely representing small vessel ischemia. No mass effect or midline shift. No abnormal extra-axial fluid collections. Gray-white matter junctions are distinct. Basal cisterns are not effaced. Mild prominence of cisterna magna. No acute intracranial hemorrhage. Vascular: Vascular calcifications demonstrated in the cervical carotid and vertebrobasilar arteries. Skull: The calvarium appears intact. Sinuses/Orbits: Mucosal thickening in the paranasal sinuses. No acute air-fluid levels. Mastoid air cells are not opacified. Other: None. IMPRESSION: No acute intracranial abnormalities. Mild chronic atrophy and small vessel ischemic changes. Electronically Signed   By: Lucienne Capers M.D.   On: 08/20/2017 22:31   Mr Lumbar Spine Wo Contrast  Result Date: 09/06/2017 CLINICAL DATA:  L2 compression fracture EXAM: MRI LUMBAR SPINE WITHOUT CONTRAST TECHNIQUE: Multiplanar, multisequence MR imaging of the lumbar spine was performed. No intravenous contrast was administered. COMPARISON:  Lumbar spine radiograph 09/05/2017, 09/02/2017 and 08/20/2017 FINDINGS: Segmentation:  Standard. Alignment:  Physiologic. Vertebrae: Burst fracture of L2 involving both endplates and both walls with approximately 50% central height loss. No retropulsion. There is a small fluid-filled cleft anteriorly. Mild  marrow edema. No other acute marrow abnormality. No ligamentous disruption. Conus medullaris: Extends to the T12 level and appears normal. Paraspinal and other soft tissues: Negative. Disc levels: Above the T12 level, there is no disc herniation, spinal canal stenosis or neuroforaminal stenosis. T12-L1: Normal disc space and facets. No spinal canal or neuroforaminal stenosis. L1-L2: Normal disc space and facets. No spinal canal or neuroforaminal stenosis. L2-L3: Fluid within both facets. Small disc bulge with mild spinal canal stenosis. Bilateral moderate neural foraminal stenosis. L3-L4: Severe right and mild left facet hypertrophy. Small diffuse disc bulge. Moderate bilateral neural foraminal stenosis. No spinal canal stenosis. L4-L5: Severe bilateral facet hypertrophy with medium-sized diffuse disc bulge. No central spinal canal stenosis. Mild narrowing of both lateral recesses. Moderate bilateral neural foraminal stenosis. L5-S1: Minimal disc bulge and mild facet hypertrophy. No spinal canal stenosis or neural foraminal stenosis. Visualized sacrum: Normal. IMPRESSION: 1. Subacute L2 burst fracture, which involves both endplates and both walls. No retropulsion. Small anterior osteonecrotic cleft. 2. Moderate bilateral neural foraminal stenosis at the L2-L5 levels due to combination of disc bulge and facet arthrosis. 3. 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 Widened, fluid-filled facet joint spaces at this level may indicate a degree of segmental instability. Additionally, facet disease may serve as a source of local low back pain. Electronically Signed   By: Ulyses Jarred M.D.   On: 09/06/2017 22:31     CBC  Recent Labs Lab 09/05/17 1124 09/06/17 0428  WBC 11.0* 10.7*  HGB 16.6 14.7  HCT 47.6 44.4  PLT 332 293  MCV 92.8 94.9  MCH 32.4  31.4  MCHC 34.9 33.1  RDW 12.6 12.8  LYMPHSABS 1.1  --   MONOABS 0.8  --   EOSABS 0.1  --   BASOSABS 0.0  --     Chemistries    Recent Labs Lab 09/05/17 1124 09/06/17 0428  NA 138 139  K 2.7* 3.8  CL 97* 102  CO2 28 27  GLUCOSE 182* 120*  BUN 31* 28*  CREATININE 1.58* 1.39*  CALCIUM 9.3 8.7*  AST 25  --   ALT 23  --   ALKPHOS 118  --   BILITOT 0.8  --    ------------------------------------------------------------------------------------------------------------------ estimated creatinine clearance is 53 mL/min (A) (by C-G formula based on SCr of 1.39 mg/dL (H)). ------------------------------------------------------------------------------------------------------------------ No results for input(s): HGBA1C in the last 72 hours. ------------------------------------------------------------------------------------------------------------------ No results for input(s): CHOL, HDL, LDLCALC, TRIG, CHOLHDL, LDLDIRECT in the last 72 hours. ------------------------------------------------------------------------------------------------------------------ No results for input(s): TSH, T4TOTAL, T3FREE, THYROIDAB in the last 72 hours.  Invalid input(s): FREET3 ------------------------------------------------------------------------------------------------------------------ No results for input(s): VITAMINB12, FOLATE, FERRITIN, TIBC, IRON, RETICCTPCT in the last 72 hours.  Coagulation profile No results for input(s): INR, PROTIME in the last 168 hours.  No results for input(s): DDIMER in the last 72 hours.  Cardiac Enzymes No results for input(s): CKMB, TROPONINI, MYOGLOBIN in the last 168 hours.  Invalid input(s): CK ------------------------------------------------------------------------------------------------------------------ Invalid input(s): POCBNP   CBG:  Recent Labs Lab 09/06/17 0754 09/06/17 1204 09/06/17 1704 09/06/17 2200 09/07/17 0743  GLUCAP 103* 168* 83 131* 89       Studies: Dg Lumbar Spine Complete  Result Date: 09/05/2017 CLINICAL DATA:  76 year old male with lumbar spine  pain, altered mental status and confusion EXAM: LUMBAR SPINE - COMPLETE 4+ VIEW COMPARISON:  Prior lumbar spine radiographs 08/20/2017 FINDINGS: Slight interval progression of height loss of the L2 compression fracture which is now greater than 50% centrally. No new compression fracture identified. Stable multilevel degenerative disc disease and advanced lower lumbar facet arthropathy. Stable mild grade 1 anterolisthesis of L4 on L5. The visualized bowel gas pattern is unremarkable. IMPRESSION: 1. Slight interval progression of height loss of the previously noted L2 compression fracture now representing greater than 50% height loss within the central aspect of the vertebral body. 2. No new fracture or malalignment. 3. Multilevel degenerative disc disease and lower lumbar facet arthropathy. Electronically Signed   By: Jacqulynn Cadet M.D.   On: 09/05/2017 12:52   Mr Lumbar Spine Wo Contrast  Result Date: 09/06/2017 CLINICAL DATA:  L2 compression fracture EXAM: MRI LUMBAR SPINE WITHOUT CONTRAST TECHNIQUE: Multiplanar, multisequence MR imaging of the lumbar spine was performed. No intravenous contrast was administered. COMPARISON:  Lumbar spine radiograph 09/05/2017, 09/02/2017 and 08/20/2017 FINDINGS: Segmentation:  Standard. Alignment:  Physiologic. Vertebrae: Burst fracture of L2 involving both endplates and both walls with approximately 50% central height loss. No retropulsion. There is a small fluid-filled cleft anteriorly. Mild marrow edema. No other acute marrow abnormality. No ligamentous disruption. Conus medullaris: Extends to the T12 level and appears normal. Paraspinal and other soft tissues: Negative. Disc levels: Above the T12 level, there is no disc herniation, spinal canal stenosis or neuroforaminal stenosis. T12-L1: Normal disc space and facets. No spinal canal or neuroforaminal stenosis. L1-L2: Normal disc space and facets. No spinal canal or neuroforaminal stenosis. L2-L3: Fluid within both  facets. Small disc bulge with mild spinal canal stenosis. Bilateral moderate neural foraminal stenosis. L3-L4: Severe right and mild left facet hypertrophy. Small diffuse disc bulge. Moderate bilateral neural foraminal stenosis. No spinal canal stenosis.  L4-L5: Severe bilateral facet hypertrophy with medium-sized diffuse disc bulge. No central spinal canal stenosis. Mild narrowing of both lateral recesses. Moderate bilateral neural foraminal stenosis. L5-S1: Minimal disc bulge and mild facet hypertrophy. No spinal canal stenosis or neural foraminal stenosis. Visualized sacrum: Normal. IMPRESSION: 1. Subacute L2 burst fracture, which involves both endplates and both walls. No retropulsion. Small anterior osteonecrotic cleft. 2. Moderate bilateral neural foraminal stenosis at the L2-L5 levels due to combination of disc bulge and facet arthrosis. 3. 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 Widened, fluid-filled facet joint spaces at this level may indicate a degree of segmental instability. Additionally, facet disease may serve as a source of local low back pain. Electronically Signed   By: Ulyses Jarred M.D.   On: 09/06/2017 22:31      Lab Results  Component Value Date   HGBA1C 6.9 (H) 03/15/2017   HGBA1C 7.1 (H) 11/12/2016   HGBA1C 6.6 07/08/2016   Lab Results  Component Value Date   MICROALBUR 41.1 (H) 03/15/2017   LDLCALC 102 (H) 03/15/2017   CREATININE 1.39 (H) 09/06/2017       Scheduled Meds: . atenolol  100 mg Oral Daily   And  . chlorthalidone  25 mg Oral Daily  . cyclobenzaprine  10 mg Oral TID  . diclofenac sodium  2 g Topical QID  . docusate sodium  100 mg Oral BID  . fentaNYL  50 mcg Transdermal Q72H  . heparin  5,000 Units Subcutaneous Q8H  . insulin aspart  0-9 Units Subcutaneous TID WC  . insulin glargine  15 Units Subcutaneous QHS  . losartan  100 mg Oral Daily  . pravastatin  40 mg Oral Daily   Continuous Infusions: . sodium chloride 75 mL/hr at  09/06/17 0804     LOS: 2 days    Time spent: >30 MINS    Reyne Dumas  [facility] Pager [phone]. If 7PM-7AM, please contact night-coverage at [email or web address], password [facility] 09/07/2017, 11:06 AM  LOS: 2 days

## 2017-09-08 ENCOUNTER — Inpatient Hospital Stay (HOSPITAL_COMMUNITY): Payer: Medicare Other

## 2017-09-08 DIAGNOSIS — K922 Gastrointestinal hemorrhage, unspecified: Secondary | ICD-10-CM

## 2017-09-08 LAB — COMPREHENSIVE METABOLIC PANEL
ALBUMIN: 3.6 g/dL (ref 3.5–5.0)
ALT: 21 U/L (ref 17–63)
AST: 25 U/L (ref 15–41)
Alkaline Phosphatase: 116 U/L (ref 38–126)
Anion gap: 11 (ref 5–15)
BILIRUBIN TOTAL: 0.8 mg/dL (ref 0.3–1.2)
BUN: 19 mg/dL (ref 6–20)
CHLORIDE: 101 mmol/L (ref 101–111)
CO2: 28 mmol/L (ref 22–32)
Calcium: 9.1 mg/dL (ref 8.9–10.3)
Creatinine, Ser: 1.18 mg/dL (ref 0.61–1.24)
GFR, EST NON AFRICAN AMERICAN: 57 mL/min — AB (ref >60)
Glucose, Bld: 101 mg/dL — ABNORMAL HIGH (ref 65–99)
POTASSIUM: 2.7 mmol/L — AB (ref 3.5–5.1)
SODIUM: 140 mmol/L (ref 135–145)
TOTAL PROTEIN: 6.8 g/dL (ref 6.5–8.1)

## 2017-09-08 LAB — BASIC METABOLIC PANEL
ANION GAP: 13 (ref 5–15)
BUN: 20 mg/dL (ref 6–20)
CALCIUM: 8.9 mg/dL (ref 8.9–10.3)
CO2: 24 mmol/L (ref 22–32)
Chloride: 101 mmol/L (ref 101–111)
Creatinine, Ser: 1.42 mg/dL — ABNORMAL HIGH (ref 0.61–1.24)
GFR calc Af Amer: 53 mL/min — ABNORMAL LOW (ref >60)
GFR, EST NON AFRICAN AMERICAN: 46 mL/min — AB (ref >60)
GLUCOSE: 221 mg/dL — AB (ref 65–99)
Potassium: 3.5 mmol/L (ref 3.5–5.1)
SODIUM: 138 mmol/L (ref 135–145)

## 2017-09-08 LAB — TYPE AND SCREEN
ABO/RH(D): O POS
ANTIBODY SCREEN: NEGATIVE

## 2017-09-08 LAB — GLUCOSE, CAPILLARY
GLUCOSE-CAPILLARY: 72 mg/dL (ref 65–99)
GLUCOSE-CAPILLARY: 143 mg/dL — AB (ref 65–99)
Glucose-Capillary: 87 mg/dL (ref 65–99)
Glucose-Capillary: 101 mg/dL — ABNORMAL HIGH (ref 65–99)
Glucose-Capillary: 149 mg/dL — ABNORMAL HIGH (ref 65–99)

## 2017-09-08 LAB — CBC
HCT: 44.3 % (ref 39.0–52.0)
HCT: 42.9 % (ref 39.0–52.0)
HEMATOCRIT: 44.7 % (ref 39.0–52.0)
HEMOGLOBIN: 15 g/dL (ref 13.0–17.0)
HEMOGLOBIN: 15.1 g/dL (ref 13.0–17.0)
Hemoglobin: 14.3 g/dL (ref 13.0–17.0)
MCH: 31.6 pg (ref 26.0–34.0)
MCH: 31.5 pg (ref 26.0–34.0)
MCH: 31.1 pg (ref 26.0–34.0)
MCHC: 33.9 g/dL (ref 30.0–36.0)
MCHC: 33.8 g/dL (ref 30.0–36.0)
MCHC: 33.3 g/dL (ref 30.0–36.0)
MCV: 93.5 fL (ref 78.0–100.0)
MCV: 93.3 fL (ref 78.0–100.0)
MCV: 93.3 fL (ref 78.0–100.0)
PLATELETS: 310 10*3/uL (ref 150–400)
PLATELETS: 386 10*3/uL (ref 150–400)
Platelets: 257 10*3/uL (ref 150–400)
RBC: 4.74 MIL/uL (ref 4.22–5.81)
RBC: 4.79 MIL/uL (ref 4.22–5.81)
RBC: 4.6 MIL/uL (ref 4.22–5.81)
RDW: 12.4 % (ref 11.5–15.5)
RDW: 12.5 % (ref 11.5–15.5)
RDW: 12.6 % (ref 11.5–15.5)
WBC: 11.5 10*3/uL — ABNORMAL HIGH (ref 4.0–10.5)
WBC: 11.4 10*3/uL — AB (ref 4.0–10.5)
WBC: 12.6 10*3/uL — AB (ref 4.0–10.5)

## 2017-09-08 LAB — MRSA PCR SCREENING: MRSA by PCR: NEGATIVE

## 2017-09-08 LAB — MAGNESIUM: MAGNESIUM: 2.1 mg/dL (ref 1.7–2.4)

## 2017-09-08 MED ORDER — KCL IN DEXTROSE-NACL 20-5-0.45 MEQ/L-%-% IV SOLN
INTRAVENOUS | Status: AC
  Administered 2017-09-08: 17:00:00 via INTRAVENOUS
  Filled 2017-09-08: qty 1000

## 2017-09-08 MED ORDER — TRAMADOL HCL 50 MG PO TABS
50.0000 mg | ORAL_TABLET | Freq: Four times a day (QID) | ORAL | Status: DC | PRN
  Administered 2017-09-08 – 2017-09-12 (×5): 50 mg via ORAL
  Filled 2017-09-08 (×6): qty 1

## 2017-09-08 MED ORDER — POTASSIUM CHLORIDE CRYS ER 20 MEQ PO TBCR
30.0000 meq | EXTENDED_RELEASE_TABLET | ORAL | Status: AC
  Administered 2017-09-08 (×2): 30 meq via ORAL
  Filled 2017-09-08 (×2): qty 1

## 2017-09-08 MED ORDER — HALOPERIDOL LACTATE 5 MG/ML IJ SOLN
5.0000 mg | Freq: Four times a day (QID) | INTRAMUSCULAR | Status: AC | PRN
  Administered 2017-09-09 – 2017-09-10 (×2): 5 mg via INTRAVENOUS
  Filled 2017-09-08 (×2): qty 1

## 2017-09-08 MED ORDER — HALOPERIDOL LACTATE 5 MG/ML IJ SOLN
2.5000 mg | Freq: Four times a day (QID) | INTRAMUSCULAR | Status: DC | PRN
  Administered 2017-09-08: 2.5 mg via INTRAVENOUS
  Filled 2017-09-08: qty 1

## 2017-09-08 MED ORDER — POLYETHYLENE GLYCOL 3350 17 G PO PACK: 17.0000 g | PACK | Freq: Every day | ORAL | Status: DC

## 2017-09-08 MED ORDER — POTASSIUM CHLORIDE 10 MEQ/100ML IV SOLN
10.0000 meq | INTRAVENOUS | Status: AC
  Administered 2017-09-08 (×4): 10 meq via INTRAVENOUS
  Filled 2017-09-08 (×4): qty 100

## 2017-09-08 MED ORDER — HALOPERIDOL LACTATE 5 MG/ML IJ SOLN
2.5000 mg | Freq: Once | INTRAMUSCULAR | Status: AC
  Administered 2017-09-08: 2.5 mg via INTRAVENOUS
  Filled 2017-09-08: qty 1

## 2017-09-08 NOTE — Progress Notes (Signed)
Patient ID: Ryan Duke, male   DOB: 06-12-1941, 76 y.o.   MRN: [id number]                                                                PROGRESS NOTE                                                                                                                                                                                                             Patient Demographics:    Ryan Duke, is a 76 y.o. male, DOB - [birth year], HYI:[id number]  Admit date - 09/05/2017   Admitting Physician Mauricio Gerome Apley, MD  Outpatient Primary MD for the patient is Janith Lima, MD  LOS - 3  Outpatient Specialists:    No chief complaint on file.   Brief Narrative  76 y.o. male with medical history significant of type 2 diabetes mellitus and hypertension, who was recently diagnosed with L2 compression fracture with 30% loss of height with no retropulsion, (September 21). He was discharged from emergency department with Vicodin and Zofran. He had a follow-up with neurosurgery on October 4th, apparently he was told that his fracture was healing well, no surgical intervention required. Hydrocodone was changed to oxycodone to improve pain control. Over the following 3 days patient's pain has been persistent, sharp in nature, 10 out of 10 in intensity, localized lower back, worse with movement, no significant improvement with narcotics, associated with decreased by mouth intake, weight loss and confusion. Due to confusion he stopped taking narcotics about 48 hours ago, today his confusion was worse, now associated with hallucinations, and he was noted to be unable to get out of his bed by himself. His wife called 39 and he was brought to the hospital for further evaluation.   ED Course: Patient received IV Fentanyl and hydromorphone with persistent back pain, and ambulatory dysfunction.    Subjective:    Sheliah Hatch today has some back pain but this is improved.  Pt has hypokalemia and this is  under repletion   No headache, No chest pain, No abdominal pain - No Nausea, No new weakness tingling or numbness, No Cough - SOB.    Assessment  & Plan :    Active Problems:   Back pain   Closed compression fracture of  L2 lumbar vertebra ([place])   1. Acute on chronic back pain. Patient known to have compression fracture, L2, imaging shown worsening height loss now greater than 50%. Emergency department staff discussed the case with neurosurgery, recommend medical management.  Supportive therapy, morphine, IV fluids, interventional radiology has been consulted to explore options for kyphoplasty. Insurance authorization and MRI lumbar spine showed subacute L2 burst fracture  .  Patient has failed outpatient treatment on oral medications and has had unsubstantiated progress with his symptoms in the outpatient setting . Vascular studies ordered due to mottling of skin of the lower extremities. Venous Doppler shows no DVT, normal bilateral waveforms showing normal arterial flow   2. Metabolic encephalopathy. Likely triggered by uncontrolled pain, and poor oral intake. Narcotics seems to be exacerbating his confusion. Currently no agitation. Will continue aggressive pain control, neurochecks every 4 hours, hydration with IV fluids.   3. Chronic kidney disease stage 3 with Hypokalemia. Hypokalemia likely related to poor oral intake combined with diuretic use, patient has received 60 mEq of potassium chloride in the emergency department. Will follow-up kidney function morning.  4. Hypertension. Will continue blood pressure regimen with atenolol/chlorthalidone, and losartan. Blood pressure elevated on admission.   5. Type 2 diabetes mellitus. Will continue glucose coverage and monitoring with insulin sliding-scale. Basal insulin with insulin glargine, decrease dose to 15 units, due to risk of hypoglycemia in the setting of poor oral intake. Hold on oral hypoglycemic agent/Januvia.  6. Dyslipidemia.  Continue pravastatin.  7. Hypokalemia could be related to chlorthalidone, now repleted  8. PAC's doubt paroxysmal afib, continue atenolol     DVT prophylaxsis heparin  Code Status:   Full code       Lab Results  Component Value Date   PLT 310 09/08/2017      Anti-infectives    None        Objective:   Vitals:   09/07/17 1345 09/07/17 2153 09/07/17 2300 09/08/17 0453  BP: (!) 126/93 (!) 174/77 (!) 165/75 (!) 157/76  Pulse: (!) 122 69  77  Resp: 18 20  18   Temp: 98.8 F (37.1 C) 98.1 F (36.7 C)  98 F (36.7 C)  TempSrc: Oral Oral  Oral  SpO2: 100% 98%  97%  Weight:      Height:        Wt Readings from Last 3 Encounters:  09/05/17 100.7 kg (222 lb 0.1 oz)  08/20/17 113.4 kg (250 lb)  03/15/17 110.7 kg (244 lb)     Intake/Output Summary (Last 24 hours) at 09/08/17 1054 Last data filed at 09/08/17 0600  Gross per 24 hour  Intake           1252.5 ml  Output             2450 ml  Net          -1197.5 ml     Physical Exam  Awake Alert, Oriented X 3, No new F.N deficits, Normal affect [place].AT,PERRAL Supple Neck,No JVD, No cervical lymphadenopathy appriciated.  Symmetrical Chest wall movement, Good air movement bilaterally, CTAB RRR,No Gallops,Rubs or new Murmurs, No Parasternal Heave +ve B.Sounds, Abd Soft, No tenderness, No organomegaly appriciated, No rebound - guarding or rigidity. No Cyanosis, Clubbing or edema, No new Rash or bruise      Data Review:    CBC  Recent Labs Lab 09/05/17 1124 09/06/17 0428 09/08/17 0432  WBC 11.0* 10.7* 11.5*  HGB 16.6 14.7 15.0  HCT 47.6 44.4 44.3  PLT 332  293 310  MCV 92.8 94.9 93.5  MCH 32.4 31.4 31.6  MCHC 34.9 33.1 33.9  RDW 12.6 12.8 12.4  LYMPHSABS 1.1  --   --   MONOABS 0.8  --   --   EOSABS 0.1  --   --   BASOSABS 0.0  --   --     Chemistries   Recent Labs Lab 09/05/17 1124 09/06/17 0428 09/08/17 0432  NA 138 139 140  K 2.7* 3.8 2.7*  CL 97* 102 101  CO2 28 27 28     GLUCOSE 182* 120* 101*  BUN 31* 28* 19  CREATININE 1.58* 1.39* 1.18  CALCIUM 9.3 8.7* 9.1  MG  --   --  2.1  AST 25  --  25  ALT 23  --  21  ALKPHOS 118  --  116  BILITOT 0.8  --  0.8   ------------------------------------------------------------------------------------------------------------------ No results for input(s): CHOL, HDL, LDLCALC, TRIG, CHOLHDL, LDLDIRECT in the last 72 hours.  Lab Results  Component Value Date   HGBA1C 6.9 (H) 03/15/2017   ------------------------------------------------------------------------------------------------------------------ No results for input(s): TSH, T4TOTAL, T3FREE, THYROIDAB in the last 72 hours.  Invalid input(s): FREET3 ------------------------------------------------------------------------------------------------------------------ No results for input(s): VITAMINB12, FOLATE, FERRITIN, TIBC, IRON, RETICCTPCT in the last 72 hours.  Coagulation profile No results for input(s): INR, PROTIME in the last 168 hours.  No results for input(s): DDIMER in the last 72 hours.  Cardiac Enzymes  Recent Labs Lab 09/07/17 1137  TROPONINI 0.04*   ------------------------------------------------------------------------------------------------------------------ No results found for: BNP  Inpatient Medications  Scheduled Meds: . atenolol  100 mg Oral Daily   And  . chlorthalidone  25 mg Oral Daily  . cyclobenzaprine  10 mg Oral TID  . diclofenac sodium  2 g Topical QID  . docusate sodium  100 mg Oral BID  . fentaNYL  50 mcg Transdermal Q72H  . heparin  5,000 Units Subcutaneous Q8H  . insulin aspart  0-9 Units Subcutaneous TID WC  . insulin glargine  15 Units Subcutaneous QHS  . losartan  100 mg Oral Daily  . pravastatin  40 mg Oral Daily   Continuous Infusions: . sodium chloride 50 mL/hr at 09/07/17 2200  . potassium chloride 10 mEq (09/08/17 0905)   PRN Meds:.acetaminophen **OR** acetaminophen, hydrALAZINE, morphine  injection, ondansetron **OR** ondansetron (ZOFRAN) IV, oxyCODONE-acetaminophen, sodium chloride flush  Micro Results No results found for this or any previous visit (from the past 240 hour(s)).  Radiology Reports Dg Chest 2 View  Result Date: 08/20/2017 CLINICAL DATA:  Back pain after a fall tonight. History of hypertension, diabetes, former smoker. EXAM: CHEST  2 VIEW COMPARISON:  12/21/2013 FINDINGS: Cardiac enlargement. Pulmonary vascularity is normal. No airspace disease or consolidation in the lungs. Prominent apical cardiac fat pad. No pleural effusions. No pneumothorax. Mediastinal contours appear intact. Degenerative changes in the spine. IMPRESSION: Cardiac enlargement.  No evidence of active pulmonary disease. Electronically Signed   By: Lucienne Capers M.D.   On: 08/20/2017 22:20   Dg Lumbar Spine Complete  Result Date: 09/05/2017 CLINICAL DATA:  76 year old male with lumbar spine pain, altered mental status and confusion EXAM: LUMBAR SPINE - COMPLETE 4+ VIEW COMPARISON:  Prior lumbar spine radiographs 08/20/2017 FINDINGS: Slight interval progression of height loss of the L2 compression fracture which is now greater than 50% centrally. No new compression fracture identified. Stable multilevel degenerative disc disease and advanced lower lumbar facet arthropathy. Stable mild grade 1 anterolisthesis of L4 on L5. The visualized bowel gas pattern  is unremarkable. IMPRESSION: 1. Slight interval progression of height loss of the previously noted L2 compression fracture now representing greater than 50% height loss within the central aspect of the vertebral body. 2. No new fracture or malalignment. 3. Multilevel degenerative disc disease and lower lumbar facet arthropathy. Electronically Signed   By: Jacqulynn Cadet M.D.   On: 09/05/2017 12:52   Dg Lumbar Spine Complete  Result Date: 08/20/2017 CLINICAL DATA:  Low back pain radiating down to the legs after a fall. History of hypertension and  diabetes. Former smoker. EXAM: LUMBAR SPINE - COMPLETE 4+ VIEW COMPARISON:  None. FINDINGS: There is acute appearing compression deformity of the L2 vertebra with about 30% loss of height. Cortical irregularities are demonstrated in the anterior vertebral margin and along the superior endplate. No retropulsion of fracture fragments. Normal alignment of the lumbar spine. Diffuse degenerative changes with narrowed interspaces and endplate hypertrophic changes throughout. Degenerative changes in the lumbar facet joints. No destructive or expansile bone lesions. Aortic calcification. IMPRESSION: Acute appearing compression fracture of the L2 vertebra with about 30% loss of height. No retropulsion of fracture fragments. Normal alignment. Electronically Signed   By: Lucienne Capers M.D.   On: 08/20/2017 22:20   Ct Head Wo Contrast  Result Date: 08/20/2017 CLINICAL DATA:  Patient fell outside of home. No loss of consciousness. Posttraumatic headache. Drinking scotch. EXAM: CT HEAD WITHOUT CONTRAST TECHNIQUE: Contiguous axial images were obtained from the base of the skull through the vertex without intravenous contrast. COMPARISON:  MRI brain 06/17/2013.  CT head 02/20/2009 FINDINGS: Brain: Mild diffuse cerebral atrophy. Mild ventricular dilatation consistent with central atrophy. Patchy low-attenuation changes in the deep white matter likely representing small vessel ischemia. No mass effect or midline shift. No abnormal extra-axial fluid collections. Gray-white matter junctions are distinct. Basal cisterns are not effaced. Mild prominence of cisterna magna. No acute intracranial hemorrhage. Vascular: Vascular calcifications demonstrated in the cervical carotid and vertebrobasilar arteries. Skull: The calvarium appears intact. Sinuses/Orbits: Mucosal thickening in the paranasal sinuses. No acute air-fluid levels. Mastoid air cells are not opacified. Other: None. IMPRESSION: No acute intracranial abnormalities. Mild  chronic atrophy and small vessel ischemic changes. Electronically Signed   By: Lucienne Capers M.D.   On: 08/20/2017 22:31   Mr Lumbar Spine Wo Contrast  Result Date: 09/06/2017 CLINICAL DATA:  L2 compression fracture EXAM: MRI LUMBAR SPINE WITHOUT CONTRAST TECHNIQUE: Multiplanar, multisequence MR imaging of the lumbar spine was performed. No intravenous contrast was administered. COMPARISON:  Lumbar spine radiograph 09/05/2017, 09/02/2017 and 08/20/2017 FINDINGS: Segmentation:  Standard. Alignment:  Physiologic. Vertebrae: Burst fracture of L2 involving both endplates and both walls with approximately 50% central height loss. No retropulsion. There is a small fluid-filled cleft anteriorly. Mild marrow edema. No other acute marrow abnormality. No ligamentous disruption. Conus medullaris: Extends to the T12 level and appears normal. Paraspinal and other soft tissues: Negative. Disc levels: Above the T12 level, there is no disc herniation, spinal canal stenosis or neuroforaminal stenosis. T12-L1: Normal disc space and facets. No spinal canal or neuroforaminal stenosis. L1-L2: Normal disc space and facets. No spinal canal or neuroforaminal stenosis. L2-L3: Fluid within both facets. Small disc bulge with mild spinal canal stenosis. Bilateral moderate neural foraminal stenosis. L3-L4: Severe right and mild left facet hypertrophy. Small diffuse disc bulge. Moderate bilateral neural foraminal stenosis. No spinal canal stenosis. L4-L5: Severe bilateral facet hypertrophy with medium-sized diffuse disc bulge. No central spinal canal stenosis. Mild narrowing of both lateral recesses. Moderate bilateral neural foraminal stenosis. L5-S1:  Minimal disc bulge and mild facet hypertrophy. No spinal canal stenosis or neural foraminal stenosis. Visualized sacrum: Normal. IMPRESSION: 1. Subacute L2 burst fracture, which involves both endplates and both walls. No retropulsion. Small anterior osteonecrotic cleft. 2. Moderate  bilateral neural foraminal stenosis at the L2-L5 levels due to combination of disc bulge and facet arthrosis. 3. 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 Widened, fluid-filled facet joint spaces at this level may indicate a degree of segmental instability. Additionally, facet disease may serve as a source of local low back pain. Electronically Signed   By: Ulyses Jarred M.D.   On: 09/06/2017 22:31    Time Spent in minutes  30   Jani Gravel M.D on 09/08/2017 at 10:54 AM  Between 7am to 7pm - Pager - [phone]  After 7pm go to [email or web address] - password [facility]  [facility] -  Office  [phone]

## 2017-09-08 NOTE — Progress Notes (Signed)
Pt is extremely agitated and combative. Refusing nursing care, swinging at RN, and trying to get out of bed. Pt is also hallucinating. Tylene Fantasia was paged. Telesitter ordered and 2.5mg  of Haldol ordered and given. Will continue to monitor.

## 2017-09-08 NOTE — Progress Notes (Signed)
Patient had a very large BM, bright red blood with  A small amount of hard/formed stool.  Many clots.  Blood covered the entire bed.  Dr. Maudie Mercury aware; orders have been placed.  Patient made NPO at this time.  BP is stable at this time; patient is no more confused than he was this AM; answers assessment questions appropriately but is forgetful and acts inappropriately.  Wife at bedside; will continue to monitor.

## 2017-09-08 NOTE — Progress Notes (Signed)
OT Cancellation Note  Patient Details Name: Ryan Duke MRN: [id number] DOB: Nov 30, 1941   Cancelled Treatment:    Reason Eval/Treat Not Completed: Medical issues which prohibited therapy  Betsy Pries 09/08/2017, 2:58 PM

## 2017-09-08 NOTE — Progress Notes (Addendum)
Patient had increasing confusion, clamminess, and increasingly cool in lower extremities moving up to his knees.  Pulses still present BIL radial and pedal, +2.  Patient's hands also very cold and clammy; patient very diaphoretic; had another very large amount of blood/clots passed per rectum.  Patient pale, grey.  Two 20G IVs placed in patient, and patient transferred directly to ICU; patient writhing, does not appear able to tolerate bleeding scan safely at this time.  Patient did have another large bloody BM with extremely large clots as before on the way to ICU. BP is still stable. Stat CBC back; hgb remains unchanged. Wife has been informed of patient's status.  Patient and belongings also taken to ICU. Report given to [facility], ICU RN and Darrick Meigs, ICU CN.

## 2017-09-08 NOTE — Progress Notes (Signed)
Patient's K+ 2.7.  Dr. Maudie Mercury notified; new orders received.  Will continue to monitor.

## 2017-09-08 NOTE — Progress Notes (Signed)
D/w neurosurgery  Staff was concerned regarding autonomic issues due to L2 burst fracture. I  Called to request consult.  Per neurosurgery No intervention warranted at this time. As long as he is able to move his legs and toes.

## 2017-09-08 NOTE — Care Management Important Message (Signed)
Important Message  Patient Details  Name: Ryan Duke MRN: [id number] Date of Birth: Feb 03, 1941   Medicare Important Message Given:  Yes    Kerin Salen 09/08/2017, 10:51 AMImportant Message  Patient Details  Name: Ryan Duke MRN: [id number] Date of Birth: 02-07-1941   Medicare Important Message Given:  Yes    Kerin Salen 09/08/2017, 10:51 AM

## 2017-09-08 NOTE — Consult Note (Signed)
Referring Provider: Dr. Maudie Mercury, [facility] Primary Care Physician:  Janith Lima, MD Primary Gastroenterologist:  Dr. Henrene Pastor  Reason for Consultation:  Rectal bleeding  HPI: Ryan Duke is a 76 y.o. male who was admitted on 10/7 with acute on chronic back pain due to known compression fractures.  Neurosurgery recommended medial management.  Today he passed a small amount of hard stool with a large amount of blood and clots.    Hgb is normal at 15.0 grams.  Colonoscopy 09/2009 was normal.  When I got to the room to see the patient I was discussing with his nurse and we were notified by the nurse tech that he had just passed a very large clot.  Clot was seen and was extremely large, blood continued to stream from rectum once the clot was passed.  Nurse and his wife report worsening confusion.  Dr. Maudie Mercury notified.  Patient denies abdominal pain.   Past Medical History:  Diagnosis Date  . Diabetes mellitus    type 2  . Hyperlipidemia   . Hypertension   . Hypogonadism, male   . Pancreatitis 2008  . Renal insufficiency     History reviewed. No pertinent surgical history.  Prior to Admission medications   Medication Sig Start Date End Date Taking? Authorizing Provider  atenolol-chlorthalidone (TENORETIC) 100-25 MG tablet Take 1 tablet by mouth daily. 09/03/17  Yes Janith Lima, MD  cyclobenzaprine (FLEXERIL) 10 MG tablet Take 10 mg by mouth 3 (three) times daily.   Yes [provider]  HYDROcodone-acetaminophen (NORCO/VICODIN) 5-325 MG tablet Take 1-2 tablets by mouth every 6 (six) hours as needed. Patient taking differently: Take 1-2 tablets by mouth every 6 (six) hours as needed for moderate pain.  08/21/17  Yes Ward, Delice Bison, DO  Insulin Glargine (TOUJEO SOLOSTAR) 300 UNIT/ML SOPN Inject 30 Units into the skin daily. Patient taking differently: Inject 30 Units into the skin at bedtime.  06/18/17  Yes Janith Lima, MD  JANUVIA 100 MG tablet TAKE 1 TABLET ONCE DAILY. 08/28/17   Yes Janith Lima, MD  losartan (COZAAR) 100 MG tablet Take 1 tablet (100 mg total) by mouth daily. 09/03/17  Yes Janith Lima, MD  ondansetron (ZOFRAN ODT) 4 MG disintegrating tablet Take 1 tablet (4 mg total) by mouth every 8 (eight) hours as needed for nausea or vomiting. 08/21/17  Yes Ward, Delice Bison, DO  oxyCODONE-acetaminophen (PERCOCET/ROXICET) 5-325 MG tablet Take 0.5-1 tablets by mouth 4 (four) times daily as needed for moderate pain.  09/02/17  Yes [provider]  pravastatin (PRAVACHOL) 40 MG tablet TAKE 1 TABLET ONCE DAILY. 12/07/16  Yes Janith Lima, MD  fentaNYL (DURAGESIC - DOSED MCG/HR) 50 MCG/HR Place 1 patch (50 mcg total) onto the skin every 3 (three) days. 09/05/17   Dorie Rank, MD  Lancets MISC Test blood sugar twice daily as directed. Dx:250.02 10/04/12   Janith Lima, MD    Current Facility-Administered Medications  Medication Dose Route Frequency Provider Last Rate Last Dose  . 0.9 %  sodium chloride infusion   Intravenous Continuous Reyne Dumas, MD 50 mL/hr at 09/07/17 2200    . acetaminophen (TYLENOL) tablet 650 mg  650 mg Oral Q6H PRN Arrien, Jimmy Picket, MD       Or  . acetaminophen (TYLENOL) suppository 650 mg  650 mg Rectal Q6H PRN Arrien, Jimmy Picket, MD      . atenolol (TENORMIN) tablet 100 mg  100 mg Oral Daily Arrien, Jimmy Picket,  MD   100 mg at 09/08/17 0904   And  . chlorthalidone (HYGROTON) tablet 25 mg  25 mg Oral Daily Arrien, Jimmy Picket, MD   25 mg at 09/08/17 0919  . cyclobenzaprine (FLEXERIL) tablet 10 mg  10 mg Oral TID Tawni Millers, MD   10 mg at 09/08/17 0904  . dextrose 5 % and 0.45 % NaCl with KCl 20 mEq/L infusion   Intravenous Continuous Jani Gravel, MD      . diclofenac sodium (VOLTAREN) 1 % transdermal gel 2 g  2 g Topical QID Arrien, Jimmy Picket, MD   2 g at 09/08/17 0915  . docusate sodium (COLACE) capsule 100 mg  100 mg Oral BID Tawni Millers, MD   100 mg at 09/08/17 0905  . fentaNYL  (DURAGESIC - dosed mcg/hr) patch 50 mcg  50 mcg Transdermal Q72H Mesner, Jason, MD   50 mcg at 09/05/17 1620  . hydrALAZINE (APRESOLINE) injection 10 mg  10 mg Intravenous Q6H PRN Reyne Dumas, MD   10 mg at 09/07/17 2158  . insulin aspart (novoLOG) injection 0-9 Units  0-9 Units Subcutaneous TID WC Arrien, Jimmy Picket, MD   2 Units at 09/07/17 1648  . insulin glargine (LANTUS) injection 15 Units  15 Units Subcutaneous QHS Tawni Millers, MD   15 Units at 09/07/17 2148  . losartan (COZAAR) tablet 100 mg  100 mg Oral Daily Arrien, Jimmy Picket, MD   100 mg at 09/08/17 0904  . morphine 4 MG/ML injection 1 mg  1 mg Intravenous Q2H PRN Arrien, Jimmy Picket, MD   1 mg at 09/08/17 0219  . ondansetron (ZOFRAN) tablet 4 mg  4 mg Oral Q6H PRN Arrien, Jimmy Picket, MD       Or  . ondansetron [facility]) injection 4 mg  4 mg Intravenous Q6H PRN Arrien, Jimmy Picket, MD   4 mg at 09/05/17 2135  . oxyCODONE-acetaminophen (PERCOCET/ROXICET) 5-325 MG per tablet 1 tablet  1 tablet Oral QID PRN Arrien, Jimmy Picket, MD   1 tablet at 09/06/17 2152  . polyethylene glycol (MIRALAX / GLYCOLAX) packet 17 g  17 g Oral Daily Jani Gravel, MD      . [COMPLETED] potassium chloride 10 mEq in 100 mL IVPB  10 mEq Intravenous Q1 Hr x 4 Jani Gravel, MD 100 mL/hr at 09/08/17 1448 10 mEq at 09/08/17 1448  . pravastatin (PRAVACHOL) tablet 40 mg  40 mg Oral Daily Arrien, Jimmy Picket, MD   40 mg at 09/08/17 0904  . sodium chloride flush (NS) 0.9 % injection 3 mL  3 mL Intravenous PRN Arrien, Jimmy Picket, MD        Allergies as of 09/05/2017 - Review Complete 09/05/2017  Allergen Reaction Noted  . Wilder Glade [dapagliflozin] Other (See Comments) 06/08/2013  . Metformin and related Other (See Comments) 06/08/2013  . Atorvastatin Other (See Comments)   . Benazepril hcl Other (See Comments) 11/01/2007    Family History  Problem Relation Age of Onset  . Hypertension Other     Social History   Social  History  . Marital status: Married    Spouse name: N/A  . Number of children: N/A  . Years of education: N/A   Occupational History  . retired    Social History Main Topics  . Smoking status: Former Smoker    Quit date: 11/30/1974  . Smokeless tobacco: Never Used  . Alcohol use 12.6 oz/week    21 Shots of liquor per week  .  Drug use: No  . Sexual activity: Yes   Other Topics Concern  . Not on file   Social History Narrative   Regular exercise-yes    Review of Systems: ROS is O/W negative except as mentioned in HPI.  Physical Exam: Vital signs in last 24 hours: Temp:  [98 F (36.7 C)-98.6 F (37 C)] 98.6 F (37 C) (10/10 1406) Pulse Rate:  [69-77] 75 (10/10 1406) Resp:  [18-20] 20 (10/10 1406) BP: (157-174)/(69-77) 158/69 (10/10 1406) SpO2:  [97 %-98 %] 98 % (10/10 1406) Last BM Date: 09/07/17 General:  Alert, Well-developed, well-nourished, pleasant and cooperative in NAD; confused Head:  Normocephalic and atraumatic. Eyes:  Sclera clear, no icterus.  Conjunctiva pink. Ears:  Normal auditory acuity. Mouth:  No deformity or lesions.   Lungs:  Clear throughout to auscultation.  No wheezes, crackles, or rhonchi.  Heart:  Regular rate and rhythm; no murmurs, clicks, rubs, or gallops. Abdomen:  Soft, non-distended.  BS present.  Non-tender. Rectal:  Not performed.  Very large clots and blood coming from rectum.  Msk:  Symmetrical without gross deformities. Pulses:  Normal pulses noted. Extremities:  Without clubbing or edema. Neurologic:  Alert but confused and not oriented to place or time;  grossly normal neurologically. Skin:  Intact without significant lesions or rashes.  Intake/Output from previous day: 10/09 0701 - 10/10 0700 In: 1492.5 [P.O.:240; I.V.:1252.5] Out: 2550 [Urine:2550] Intake/Output this shift: Total I/O In: 120 [P.O.:120] Out: 700 [Urine:700]  Lab Results:  Recent Labs  09/06/17 0428 09/08/17 0432  WBC 10.7* 11.5*  HGB 14.7 15.0    HCT 44.4 44.3  PLT 293 310   BMET  Recent Labs  09/06/17 0428 09/08/17 0432  NA 139 140  K 3.8 2.7*  CL 102 101  CO2 27 28  GLUCOSE 120* 101*  BUN 28* 19  CREATININE 1.39* 1.18  CALCIUM 8.7* 9.1   LFT  Recent Labs  09/08/17 0432  PROT 6.8  ALBUMIN 3.6  AST 25  ALT 21  ALKPHOS 116  BILITOT 0.8   Studies/Results: Mr Lumbar Spine Wo Contrast  Result Date: 09/06/2017 CLINICAL DATA:  L2 compression fracture EXAM: MRI LUMBAR SPINE WITHOUT CONTRAST TECHNIQUE: Multiplanar, multisequence MR imaging of the lumbar spine was performed. No intravenous contrast was administered. COMPARISON:  Lumbar spine radiograph 09/05/2017, 09/02/2017 and 08/20/2017 FINDINGS: Segmentation:  Standard. Alignment:  Physiologic. Vertebrae: Burst fracture of L2 involving both endplates and both walls with approximately 50% central height loss. No retropulsion. There is a small fluid-filled cleft anteriorly. Mild marrow edema. No other acute marrow abnormality. No ligamentous disruption. Conus medullaris: Extends to the T12 level and appears normal. Paraspinal and other soft tissues: Negative. Disc levels: Above the T12 level, there is no disc herniation, spinal canal stenosis or neuroforaminal stenosis. T12-L1: Normal disc space and facets. No spinal canal or neuroforaminal stenosis. L1-L2: Normal disc space and facets. No spinal canal or neuroforaminal stenosis. L2-L3: Fluid within both facets. Small disc bulge with mild spinal canal stenosis. Bilateral moderate neural foraminal stenosis. L3-L4: Severe right and mild left facet hypertrophy. Small diffuse disc bulge. Moderate bilateral neural foraminal stenosis. No spinal canal stenosis. L4-L5: Severe bilateral facet hypertrophy with medium-sized diffuse disc bulge. No central spinal canal stenosis. Mild narrowing of both lateral recesses. Moderate bilateral neural foraminal stenosis. L5-S1: Minimal disc bulge and mild facet hypertrophy. No spinal canal stenosis  or neural foraminal stenosis. Visualized sacrum: Normal. IMPRESSION: 1. Subacute L2 burst fracture, which involves both endplates and both walls. No  retropulsion. Small anterior osteonecrotic cleft. 2. Moderate bilateral neural foraminal stenosis at the L2-L5 levels due to combination of disc bulge and facet arthrosis. 3. 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 Widened, fluid-filled facet joint spaces at this level may indicate a degree of segmental instability. Additionally, facet disease may serve as a source of local low back pain. Electronically Signed   By: Ulyses Jarred M.D.   On: 09/06/2017 22:31   IMPRESSION:  *GI bleed:  Patient bleeding quite significantly with large clots.  ? Diverticular bleed vs other source.   PLAN: *Patient is going to be transferred to step-down. *STAT CBC and type and screen. *STAT NM bleeding scan ordered and I discussed with them.  I discussed with IR as well since he will need possible angio with embolization if bleeding scan positive.  ZEHR, JESSICA D.  09/08/2017, 3:01 PM  Pager number [phone]   Attending physician's note   I have taken a history, examined the patient and reviewed the chart. I agree with the Advanced Practitioner's note, impression and recommendations. 31 yr M admitted with worsening back pain developed acute lower GI bleed. Passing large clots and blood. Presentation is suspicious for acute diverticular hemorrhage. Hgb 15 but expect to drop further. Agree with transfer to step down.  Monitor Hgb and transfuse as needed Follow up Nuclear RBC tag scan and possible angio embolization based RBC tag scan findings  K Denzil Magnuson, MD [phone] Mon-Fri 8a-5p [phone] after 5p, weekends, holidays

## 2017-09-08 NOTE — Progress Notes (Signed)
Pt continually agitated and combative. Tylene Fantasia notified. Regular sitter ordered and 2.5mg  of additional haldol order and PRN changed to 5mg  q6h. Will continue to monitor.

## 2017-09-08 NOTE — Progress Notes (Signed)
Patient ID: Ryan Duke, male   DOB: 1941-08-16, 76 y.o.   MRN: [id number]   Rectal bleeding Type and screen Repeat cbc bmp pending GI consult requested Pt couldn't tolerate the nuclear medicine scan Will check CT abd/ pelvis  NPO except for medications Ns iv

## 2017-09-08 NOTE — Progress Notes (Signed)
Per RN, pt combative and aggressive, even after 2.5mg  of haldol. Originally, ordered Oncologist, now with Engineer, materials. Ordered more haldol now and changed dose to 5mg  q6hrs prn x 2 doses. Baltazar Najjar, NP
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
## 2017-09-09 DIAGNOSIS — K625 Hemorrhage of anus and rectum: Secondary | ICD-10-CM

## 2017-09-09 DIAGNOSIS — K921 Melena: Secondary | ICD-10-CM

## 2017-09-09 DIAGNOSIS — K5900 Constipation, unspecified: Secondary | ICD-10-CM

## 2017-09-09 DIAGNOSIS — G9341 Metabolic encephalopathy: Secondary | ICD-10-CM

## 2017-09-09 DIAGNOSIS — D62 Acute posthemorrhagic anemia: Secondary | ICD-10-CM

## 2017-09-09 DIAGNOSIS — N184 Chronic kidney disease, stage 4 (severe): Secondary | ICD-10-CM

## 2017-09-09 LAB — COMPREHENSIVE METABOLIC PANEL
ALBUMIN: 3.7 g/dL (ref 3.5–5.0)
ALT: 27 U/L (ref 17–63)
ANION GAP: 11 (ref 5–15)
AST: 30 U/L (ref 15–41)
Alkaline Phosphatase: 116 U/L (ref 38–126)
BUN: 30 mg/dL — ABNORMAL HIGH (ref 6–20)
CHLORIDE: 103 mmol/L (ref 101–111)
CO2: 24 mmol/L (ref 22–32)
Calcium: 9 mg/dL (ref 8.9–10.3)
Creatinine, Ser: 1.52 mg/dL — ABNORMAL HIGH (ref 0.61–1.24)
GFR calc Af Amer: 49 mL/min — ABNORMAL LOW (ref >60)
GFR calc non Af Amer: 42 mL/min — ABNORMAL LOW (ref >60)
GLUCOSE: 152 mg/dL — AB (ref 65–99)
POTASSIUM: 3.6 mmol/L (ref 3.5–5.1)
SODIUM: 138 mmol/L (ref 135–145)
Total Bilirubin: 0.7 mg/dL (ref 0.3–1.2)
Total Protein: 6.7 g/dL (ref 6.5–8.1)

## 2017-09-09 LAB — CBC
HCT: 41.1 % (ref 39.0–52.0)
HEMOGLOBIN: 13.8 g/dL (ref 13.0–17.0)
MCH: 31.4 pg (ref 26.0–34.0)
MCHC: 33.6 g/dL (ref 30.0–36.0)
MCV: 93.4 fL (ref 78.0–100.0)
Platelets: 341 10*3/uL (ref 150–400)
RBC: 4.4 MIL/uL (ref 4.22–5.81)
RDW: 12.6 % (ref 11.5–15.5)
WBC: 12.5 10*3/uL — ABNORMAL HIGH (ref 4.0–10.5)

## 2017-09-09 LAB — GLUCOSE, CAPILLARY: Glucose-Capillary: 126 mg/dL — ABNORMAL HIGH (ref 65–99)

## 2017-09-09 LAB — ABO/RH: ABO/RH(D): O POS

## 2017-09-09 MED ORDER — QUETIAPINE FUMARATE 25 MG PO TABS
25.0000 mg | ORAL_TABLET | Freq: Every day | ORAL | Status: DC
  Administered 2017-09-09 – 2017-09-16 (×8): 25 mg via ORAL
  Filled 2017-09-09 (×8): qty 1

## 2017-09-09 MED ORDER — HALOPERIDOL LACTATE 5 MG/ML IJ SOLN
2.0000 mg | Freq: Once | INTRAMUSCULAR | Status: AC
  Administered 2017-09-09: 2 mg via INTRAVENOUS
  Filled 2017-09-09: qty 1

## 2017-09-09 MED ORDER — TAMSULOSIN HCL 0.4 MG PO CAPS
0.4000 mg | ORAL_CAPSULE | Freq: Every day | ORAL | Status: DC
  Administered 2017-09-09 – 2017-09-16 (×8): 0.4 mg via ORAL
  Filled 2017-09-09 (×8): qty 1

## 2017-09-09 MED ORDER — PHENOL 1.4 % MT LIQD
1.0000 | OROMUCOSAL | Status: DC | PRN
  Filled 2017-09-09: qty 177

## 2017-09-09 MED ORDER — METHOCARBAMOL 500 MG PO TABS
500.0000 mg | ORAL_TABLET | Freq: Three times a day (TID) | ORAL | Status: DC
  Administered 2017-09-09 – 2017-09-16 (×10): 500 mg via ORAL
  Filled 2017-09-09 (×13): qty 1

## 2017-09-09 MED ORDER — QUETIAPINE FUMARATE 50 MG PO TABS
50.0000 mg | ORAL_TABLET | Freq: Every day | ORAL | Status: DC
  Administered 2017-09-09 – 2017-09-15 (×7): 50 mg via ORAL
  Filled 2017-09-09 (×7): qty 1

## 2017-09-09 MED ORDER — POTASSIUM CHLORIDE CRYS ER 20 MEQ PO TBCR
40.0000 meq | EXTENDED_RELEASE_TABLET | Freq: Once | ORAL | Status: AC
  Administered 2017-09-09: 40 meq via ORAL
  Filled 2017-09-09: qty 2

## 2017-09-09 NOTE — Progress Notes (Signed)
IR aware of current events.  Will hold on KP for now given his agitation and combativeness.  He would never be able to stay still for a KP at this time.  Will follow in chart for when patient may be stable to proceed.  Arbell Wycoff E 9:02 AM 09/09/2017

## 2017-09-09 NOTE — Progress Notes (Signed)
     [facility] Progress Note  CC:  Rectal bleeding  Subjective:  Patient extremely confused, agitated, aggressive, combative overnight.  Refusing nursing care, trying to get out of bed, swinging at the nurses.  Was not able to hold still to even attempt the bleeding scan.  Just received a dose of Haldol.  Per nursing staff, his bleeding has significantly slowed with only one small episode overnight.  He is actually completely oriented at this time and just angry about the mittens, etc.  Objective:  Vital signs in last 24 hours: Temp:  [98.2 F (36.8 C)-98.7 F (37.1 C)] 98.2 F (36.8 C) (10/11 0400) Pulse Rate:  [71-95] 71 (10/11 0800) Resp:  [16-30] 23 (10/11 0800) BP: (98-158)/(43-93) 103/52 (10/11 0700) SpO2:  [95 %-100 %] 95 % (10/11 0800) Last BM Date: 09/08/17 General:  Alert, Well-developed, in NAD Neurologic:  Alert and oriented x 3;  grossly normal neurologically.  Patient not examined otherwise since he had been combative and I was advised to keep my distance.  Intake/Output from previous day: 10/10 0701 - 10/11 0700 In: 670 [P.O.:120; I.V.:550] Out: 750 [Urine:750] Intake/Output this shift: Total I/O In: 1500 [I.V.:1500] Out: -   Lab Results:  Recent Labs  09/08/17 1554 09/08/17 1746 09/09/17 0329  WBC 11.4* 12.6* 12.5*  HGB 15.1 14.3 13.8  HCT 44.7 42.9 41.1  PLT 257 386 341   BMET  Recent Labs  09/08/17 0432 09/08/17 1746 09/09/17 0329  NA 140 138 138  K 2.7* 3.5 3.6  CL 101 101 103  CO2 28 24 24   GLUCOSE 101* 221* 152*  BUN 19 20 30*  CREATININE 1.18 1.42* 1.52*  CALCIUM 9.1 8.9 9.0   LFT  Recent Labs  09/09/17 0329  PROT 6.7  ALBUMIN 3.7  AST 30  ALT 27  ALKPHOS 116  BILITOT 0.7   Assessment / Plan: *GI bleed:  Highly suspicious for diverticular bleed.  Hopefully self limited as it has significantly slowed, possibly even stopped.  Not able to have bleeding scan due to agitation.  Hgb is still normal.  -Will  plan for flexible sigmoidoscopy tentatively on 10/12 at 1330.  Will receive 2 tap water enemas tomorrow prior to the procedure.   -CT scan abdomen and pelvis without contrast ordered for today.  Will not show source of bleeding, but can be performed if there are other concerns. -At most clear liquids today and then NPO after midnight. -Monitor Hgb.   LOS: 4 days   ZEHR, JESSICA D.  09/09/2017, 9:40 AM  Pager number [phone]   Attending physician's note   I have taken an interval history, reviewed the chart and examined the patient. I agree with the Advanced Practitioner's note, impression and recommendations.  Hgb trended down to 12, hemodynamically stable Lower GI bleed. Differential includes diverticular hemorrhage vs stercoral rectal ulcer  Patient was extremely agitated and delirious last night and couldn't undergo RBC tag scan Will plan for flex sig tomorrow to evaluate and intervene therapeutically if needed The risks and benefits as well as alternatives of endoscopic procedure(s) have been discussed and reviewed. All questions answered. Patient's wife agrees to proceed.  Damaris Hippo, MD [phone] Mon-Fri 8a-5p [phone] after 5p, weekends, holidays
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
## 2017-09-09 NOTE — Progress Notes (Signed)
Patient unable to void with 655mL in bladder. Attempted to sit up patient and use running water to stimulate patient to void. Attempts for spontaneous voiding failed. MD paged and orders for Flomax and a foley received.

## 2017-09-09 NOTE — Progress Notes (Addendum)
PROGRESS NOTE    Ryan Duke  NWG:[id number] DOB: Aug 24, 1941 DOA: 09/05/2017 PCP: Janith Lima, MD   No chief complaint on file.   Brief Narrative:  76 y.o.malewith medical history significant of type 2 diabetes mellitus and hypertension, who was recently diagnosed with L2 compression fracture with 30% loss of height with no retropulsion, (September 21). He was discharged from emergency department with Vicodin and Zofran. He had a follow-up with neurosurgery on October 4th, apparently he was told that his fracture was healing well, no surgical intervention required. Hydrocodone was changed to oxycodone to improve pain control. Over the following3 days patient's pain has been persistent, sharp in nature, 10 out of 10 in intensity,localized lower back, worse with movement, no significant improvement with narcotics, associated with decreased by mouth intake, weight loss and confusion. Due to confusion he stopped taking narcotics about 48 hours ago, today his confusion was worse, now associated with hallucinations, and he was noted to beunable to get out of his bed by himself. His wife called 49 and he was brought tothe hospital for further evaluation.  Now with acute encephalopathy/hallucinating. Pending IR for possible kyphoplasty. GI consulted for rectal bleeding.  Assessment & Plan   Acute metabolic encephalopathy -Likely triggered by uncontrolled pain and poor oral intake -Continue pain control -Discussed with neurology, recommended Seroquel 25 mg daily, 50 mg daily at bedtime -Currently on Haldol -will obtain EKG to monitor QTC -will discontinue Flexeril and placed on Robaxin due to lower side effect profile  Addendum: mental status improving  Acute urinary retention -Bladder scan with 659cc -will order foley catheter and flomax  Acute on chronic back pain/L2 compression fracture -Imaging showed worsening height loss, now greater than 50% -ED discussed with neurosurgery  who recommended medical management -Continue supportive care with pain control -Interventional radiology consulted for possible kyphoplasty, pending insurance authorization -MRI lumbar spine showed subacute L2 burst fracture -Patient did fail outpatient management with oral medications -Vascular studies ordered due to mottling of scale or extremities, venous Dopplers were negative for DVT, normal bilateral waveform showing normal arterial flow. On exam, patient does have decent pulses of the lower extremities.  Rectal bleeding -possibly due to diverticular bleed -unable to tolerate bleeding scan due to agitation -hemoglobin stable, 13.8 -gastroenterology consulted and appreciated -CT scan abdomen and pelvispending -Possible flexible sigmoidoscopy on 09/10/2017 -Currently on clear liquids -Continue to monitor CBC    Chronic kidney disease, stage III -Creatinine appears to be at baseline, continue to monitor BMP  Essential hypertension -Continue atenolol, chlorthalidone, losartan -BP currently stable  Diabetes mellitus, type II -Continue Lantus, insulin sliding scale, CBG monitoring  Dyslipidemia -Continue statin  Hypokalemia -Possibly related to chlorthalidone -potassium repleted, currently 3.6 -Continue to monitor BMP  DVT Prophylaxis  SCDs  Code Status: Full  Family Communication: None at bedside  Disposition Plan: Admitted, pending decision on L2 kyphoplasty   Consultants Neurosurgery by [place] Interventional radiology Neurology, Dr. Irish Elders, via phone  Procedures  Lower extremity doppler  Antibiotics   Anti-infectives    None      Subjective:   Ryan Duke seen and examined today.  Currenlty altered.   Objective:   Vitals:   09/09/17 0400 09/09/17 0500 09/09/17 0700 09/09/17 0800  BP: (!) 124/93 (!) 141/64 (!) 103/52   Pulse: 73 78 75 71  Resp: 18 19 (!) 25 (!) 23  Temp: 98.2 F (36.8 C)     TempSrc: Oral     SpO2: 100% 96%  95%  Weight:  Height:        Intake/Output Summary (Last 24 hours) at 09/09/17 1043 Last data filed at 09/09/17 0800  Gross per 24 hour  Intake             2050 ml  Output              350 ml  Net             1700 ml   Filed Weights   09/05/17 1030 09/05/17 2044  Weight: 113.4 kg (250 lb) 100.7 kg (222 lb 0.1 oz)    Exam  General: Well developed, well nourished, NAD, appears stated age  109: NCAT,  mucous membranes moist.   Cardiovascular: S1 S2 auscultated, RRR, +SEM  Respiratory: Clear to auscultation bilaterally  Abdomen: Soft, nontender, nondistended, + bowel sounds  Extremities: warm dry without cyanosis clubbing or edema  Neuro: Awake, cannot assess orientation. Moves all extremities.   Psych: Confused, agitated    Data Reviewed: I have personally reviewed following labs and imaging studies  CBC:  Recent Labs Lab 09/05/17 1124 09/06/17 0428 09/08/17 0432 09/08/17 1554 09/08/17 1746 09/09/17 0329  WBC 11.0* 10.7* 11.5* 11.4* 12.6* 12.5*  NEUTROABS 9.0*  --   --   --   --   --   HGB 16.6 14.7 15.0 15.1 14.3 13.8  HCT 47.6 44.4 44.3 44.7 42.9 41.1  MCV 92.8 94.9 93.5 93.3 93.3 93.4  PLT 332 293 310 257 386 427   Basic Metabolic Panel:  Recent Labs Lab 09/05/17 1124 09/06/17 0428 09/08/17 0432 09/08/17 1746 09/09/17 0329  NA 138 139 140 138 138  K 2.7* 3.8 2.7* 3.5 3.6  CL 97* 102 101 101 103  CO2 28 27 28 24 24   GLUCOSE 182* 120* 101* 221* 152*  BUN 31* 28* 19 20 30*  CREATININE 1.58* 1.39* 1.18 1.42* 1.52*  CALCIUM 9.3 8.7* 9.1 8.9 9.0  MG  --   --  2.1  --   --    GFR: Estimated Creatinine Clearance: 48.4 mL/min (A) (by C-G formula based on SCr of 1.52 mg/dL (H)). Liver Function Tests:  Recent Labs Lab 09/05/17 1124 09/08/17 0432 09/09/17 0329  AST 25 25 30   ALT 23 21 27   ALKPHOS 118 116 116  BILITOT 0.8 0.8 0.7  PROT 7.1 6.8 6.7  ALBUMIN 3.9 3.6 3.7   No results for input(s): LIPASE, AMYLASE in the last 168 hours. No results for  input(s): AMMONIA in the last 168 hours. Coagulation Profile: No results for input(s): INR, PROTIME in the last 168 hours. Cardiac Enzymes:  Recent Labs Lab 09/07/17 1137  TROPONINI 0.04*   BNP (last 3 results) No results for input(s): PROBNP in the last 8760 hours. HbA1C: No results for input(s): HGBA1C in the last 72 hours. CBG:  Recent Labs Lab 09/08/17 0724 09/08/17 1202 09/08/17 1659 09/08/17 1732 09/08/17 2111  GLUCAP 72 87 101* 143* 149*   Lipid Profile: No results for input(s): CHOL, HDL, LDLCALC, TRIG, CHOLHDL, LDLDIRECT in the last 72 hours. Thyroid Function Tests: No results for input(s): TSH, T4TOTAL, FREET4, T3FREE, THYROIDAB in the last 72 hours. Anemia Panel: No results for input(s): VITAMINB12, FOLATE, FERRITIN, TIBC, IRON, RETICCTPCT in the last 72 hours. Urine analysis:    Component Value Date/Time   COLORURINE YELLOW 09/05/2017 1702   APPEARANCEUR HAZY (A) 09/05/2017 1702   LABSPEC 1.030 09/05/2017 1702   PHURINE 5.0 09/05/2017 1702   GLUCOSEU NEGATIVE 09/05/2017 1702   GLUCOSEU NEGATIVE 03/15/2017  [place] 09/05/2017 [place] 09/05/2017 1702   KETONESUR NEGATIVE 09/05/2017 1702   PROTEINUR 100 (A) 09/05/2017 1702   UROBILINOGEN 0.2 03/15/2017 1002   NITRITE NEGATIVE 09/05/2017 1702   LEUKOCYTESUR NEGATIVE 09/05/2017 1702   Sepsis Labs: @LABRCNTIP (procalcitonin:4,lacticidven:4)  ) Recent Results (from the past 240 hour(s))  MRSA PCR Screening     Status: None   Collection Time: 09/08/17  5:23 PM  Result Value Ref Range Status   MRSA by PCR NEGATIVE NEGATIVE Final    Comment:        The GeneXpert MRSA Assay (FDA approved for NASAL specimens only), is one component of a comprehensive MRSA colonization surveillance program. It is not intended to diagnose MRSA infection nor to guide or monitor treatment for MRSA infections.       Radiology Studies: No results found.   Scheduled Meds: . atenolol   100 mg Oral Daily   And  . chlorthalidone  25 mg Oral Daily  . cyclobenzaprine  10 mg Oral TID  . diclofenac sodium  2 g Topical QID  . docusate sodium  100 mg Oral BID  . fentaNYL  50 mcg Transdermal Q72H  . insulin aspart  0-9 Units Subcutaneous TID WC  . insulin glargine  15 Units Subcutaneous QHS  . losartan  100 mg Oral Daily  . polyethylene glycol  17 g Oral Daily  . pravastatin  40 mg Oral Daily   Continuous Infusions: . sodium chloride 50 mL/hr at 09/09/17 0800  . dextrose 5 % and 0.45 % NaCl with KCl 20 mEq/L 50 mL/hr at 09/09/17 0800     LOS: 4 days   Time Spent in minutes   45 minutes  Larayne Baxley D.O. on 09/09/2017 at 10:43 AM  Between 7am to 7pm - Pager - [phone]  After 7pm go to [email or web address] - password [email or web address]  And look for the night coverage person covering for me after hours  [facility] Office  [phone]

## 2017-09-09 NOTE — Progress Notes (Signed)
Date:  September 09, 2017 Chart reviewed for concurrent status and case management needs.  Will continue to follow patient progress.  Discharge Planning: following for needs  Expected discharge date: [id number]  Lloyd Cullinan, BSN, RN3, CCM   [phone]  

## 2017-09-10 ENCOUNTER — Inpatient Hospital Stay (HOSPITAL_COMMUNITY): Payer: Medicare Other | Admitting: Anesthesiology

## 2017-09-10 ENCOUNTER — Encounter (HOSPITAL_COMMUNITY): Payer: Self-pay | Admitting: Certified Registered Nurse Anesthetist

## 2017-09-10 ENCOUNTER — Encounter (HOSPITAL_COMMUNITY)
Admission: EM | Disposition: A | Discharge status: Skilled Nursing Facility | Payer: Self-pay | Source: Home / Self Care | Attending: Internal Medicine

## 2017-09-10 DIAGNOSIS — K648 Other hemorrhoids: Secondary | ICD-10-CM

## 2017-09-10 DIAGNOSIS — K626 Ulcer of anus and rectum: Secondary | ICD-10-CM

## 2017-09-10 DIAGNOSIS — D62 Acute posthemorrhagic anemia: Secondary | ICD-10-CM

## 2017-09-10 HISTORY — PX: FLEXIBLE SIGMOIDOSCOPY: SHX5431

## 2017-09-10 LAB — CBC
HCT: 33.5 % — ABNORMAL LOW (ref 39.0–52.0)
Hemoglobin: 11.2 g/dL — ABNORMAL LOW (ref 13.0–17.0)
MCH: 31.1 pg (ref 26.0–34.0)
MCHC: 33.4 g/dL (ref 30.0–36.0)
MCV: 93.1 fL (ref 78.0–100.0)
PLATELETS: 292 10*3/uL (ref 150–400)
RBC: 3.6 MIL/uL — AB (ref 4.22–5.81)
RDW: 12.5 % (ref 11.5–15.5)
WBC: 9.6 10*3/uL (ref 4.0–10.5)

## 2017-09-10 LAB — BASIC METABOLIC PANEL
ANION GAP: 9 (ref 5–15)
BUN: 38 mg/dL — ABNORMAL HIGH (ref 6–20)
CALCIUM: 8.4 mg/dL — AB (ref 8.9–10.3)
CO2: 22 mmol/L (ref 22–32)
Chloride: 107 mmol/L (ref 101–111)
Creatinine, Ser: 1.5 mg/dL — ABNORMAL HIGH (ref 0.61–1.24)
GFR, EST AFRICAN AMERICAN: 50 mL/min — AB (ref >60)
GFR, EST NON AFRICAN AMERICAN: 43 mL/min — AB (ref >60)
GLUCOSE: 106 mg/dL — AB (ref 65–99)
POTASSIUM: 3.9 mmol/L (ref 3.5–5.1)
Sodium: 138 mmol/L (ref 135–145)

## 2017-09-10 LAB — GLUCOSE, CAPILLARY
GLUCOSE-CAPILLARY: 124 mg/dL — AB (ref 65–99)
GLUCOSE-CAPILLARY: 156 mg/dL — AB (ref 65–99)
GLUCOSE-CAPILLARY: 105 mg/dL — AB (ref 65–99)
GLUCOSE-CAPILLARY: 102 mg/dL — AB (ref 65–99)
GLUCOSE-CAPILLARY: 107 mg/dL — AB (ref 65–99)
GLUCOSE-CAPILLARY: 95 mg/dL (ref 65–99)
GLUCOSE-CAPILLARY: 132 mg/dL — AB (ref 65–99)

## 2017-09-10 SURGERY — SIGMOIDOSCOPY, FLEXIBLE
Anesthesia: Monitor Anesthesia Care

## 2017-09-10 MED ORDER — PROPOFOL 10 MG/ML IV BOLUS
INTRAVENOUS | Status: AC
  Filled 2017-09-10: qty 40

## 2017-09-10 MED ORDER — PHENYLEPHRINE HCL 10 MG/ML IJ SOLN
INTRAMUSCULAR | Status: DC | PRN
  Administered 2017-09-10 (×2): 80 mg via INTRAVENOUS

## 2017-09-10 MED ORDER — SODIUM CHLORIDE 0.9 % IV SOLN: INTRAVENOUS | Status: DC

## 2017-09-10 MED ORDER — POLYETHYLENE GLYCOL 3350 17 G PO PACK
17.0000 g | PACK | Freq: Three times a day (TID) | ORAL | Status: DC
  Administered 2017-09-10 – 2017-09-12 (×3): 17 g via ORAL
  Filled 2017-09-10 (×4): qty 1

## 2017-09-10 MED ORDER — PROPOFOL 500 MG/50ML IV EMUL
INTRAVENOUS | Status: DC | PRN
  Administered 2017-09-10: 30 mg via INTRAVENOUS

## 2017-09-10 MED ORDER — PROPOFOL 500 MG/50ML IV EMUL
INTRAVENOUS | Status: DC | PRN
  Administered 2017-09-10: 50 ug/kg/min via INTRAVENOUS

## 2017-09-10 MED ORDER — HALOPERIDOL LACTATE 5 MG/ML IJ SOLN
5.0000 mg | Freq: Four times a day (QID) | INTRAMUSCULAR | Status: AC | PRN
  Administered 2017-09-10 – 2017-09-11 (×2): 5 mg via INTRAVENOUS
  Filled 2017-09-10 (×2): qty 1

## 2017-09-10 MED ORDER — ATENOLOL 50 MG PO TABS
50.0000 mg | ORAL_TABLET | Freq: Every day | ORAL | Status: DC
  Administered 2017-09-11 – 2017-09-16 (×6): 50 mg via ORAL
  Filled 2017-09-10 (×4): qty 1
  Filled 2017-09-10: qty 2
  Filled 2017-09-10: qty 1

## 2017-09-10 MED ORDER — CALCIUM POLYCARBOPHIL 625 MG PO TABS
625.0000 mg | ORAL_TABLET | Freq: Three times a day (TID) | ORAL | Status: DC
  Administered 2017-09-10 – 2017-09-12 (×6): 625 mg via ORAL
  Filled 2017-09-10 (×10): qty 1

## 2017-09-10 NOTE — Progress Notes (Signed)
Tap water enema completed at 1326. Pt was able to retain enema for 6 minutes. Enema output was brown liquid and some soft brown stool. Dr. Silverio Decamp made aware of output.

## 2017-09-10 NOTE — Progress Notes (Signed)
Patient a bloody BM yesterday evening and one overnight, but none now for several hours.  Will plan to proceed with flex sig this afternoon.  Tap water enemas ordered and patient is currently pleasant and agreeable to both procedure and enemas.  He has no new complaints this AM.  No abdominal pain.  Hgb is 11.2 grams, which is down about 4 grams over the past 5 days.

## 2017-09-10 NOTE — Anesthesia Postprocedure Evaluation (Signed)
Anesthesia Post Note  Patient: Ryan Duke  Procedure(s) Performed: FLEXIBLE SIGMOIDOSCOPY (N/A )     Patient location during evaluation: PACU Anesthesia Type: MAC Level of consciousness: awake and alert Pain management: pain level controlled Vital Signs Assessment: post-procedure vital signs reviewed and stable Respiratory status: spontaneous breathing, nonlabored ventilation and respiratory function stable Cardiovascular status: stable and blood pressure returned to baseline Postop Assessment: no apparent nausea or vomiting Anesthetic complications: no    Last Vitals:  Vitals:   09/10/17 1440 09/10/17 1450  BP: 136/64 123/63  Pulse: 81 82  Resp: (!) 22 (!) 25  Temp:    SpO2: 94% 95%    Last Pain:  Vitals:   09/10/17 1417  TempSrc: Oral  PainSc:                  Alim Cattell,W. EDMOND

## 2017-09-10 NOTE — Op Note (Signed)
[facility] Patient Name: Ryan Duke Procedure Date: 09/10/2017 MRN: [id number] Attending MD: Mauri Pole , MD Date of Birth: 05/29/1941 CSN: [id number] Age: 76 Admit Type: Inpatient Procedure:                Flexible Sigmoidoscopy Indications:              Hematochezia Providers:                Mauri Pole, MD, Cheron Every, Technician Referring MD:              Medicines:                Monitored Anesthesia Care Complications:            No immediate complications. Estimated Blood Loss:     Estimated blood loss: none. Procedure:                Pre-Anesthesia Assessment:                           - Prior to the procedure, a History and Physical                            was performed, and patient medications and                            allergies were reviewed. The patient's tolerance of                            previous anesthesia was also reviewed. The risks                            and benefits of the procedure and the sedation                            options and risks were discussed with the patient.                            All questions were answered, and informed consent                            was obtained. Prior Anticoagulants: The patient has                            taken no previous anticoagulant or antiplatelet                            agents. ASA Grade Assessment: III - A patient with                            severe systemic disease. After reviewing the risks                            and benefits,  the patient was deemed in                            satisfactory condition to undergo the procedure.                           After obtaining informed consent, the scope was                            passed under direct vision. The EC-[id number] ([id number])                            scope was introduced through the anus and advanced                            to the the  sigmoid colon. The flexible                            sigmoidoscopy was accomplished without difficulty.                            The patient tolerated the procedure well. The                            quality of the bowel preparation was poor. Scope In: Scope Out: Findings:      The perianal and digital rectal examinations were normal.      A few cratered clean based ulcers with maximal diameter of 1cm were       found in the distal rectum. No bleeding was present. No stigmata of       recent bleeding were seen.      Non-bleeding internal hemorrhoids were found during retroflexion. The       hemorrhoids were medium-sized.      Normal mucosa othewise was found in the recto-sigmoid colon and in the       sigmoid colon. Impression:               - Preparation of the colon was poor.                           - A few ulcers in the distal rectum consistent with                            stercoral ulcers                           - Non-bleeding internal hemorrhoids.                           - Otherwise normal mucosa in the recto-sigmoid                            colon and in the sigmoid colon.                           - No specimens collected. Moderate Sedation:  N/A- Per Anesthesia Care Recommendation:           - Miralax 1 capful (17 grams) in 8 ounces of water                            PO TID.                           - Use Benefiber one teaspoon PO TID.                           - Use Canasa 1000 mg suppository 1 per rectum QHS                            for 5 days.                           - Resume previous diet. Advance diet as tolerated                           - Continue present medications.                           - No ibuprofen, naproxen, or other non-steroidal                            anti-inflammatory drugs.                           -Will sign off, please call with any questions Procedure Code(s):        --- Professional ---                            [phone], Sigmoidoscopy, flexible; diagnostic,                            including collection of specimen(s) by brushing or                            washing, when performed (separate procedure) Diagnosis Code(s):        --- Professional ---                           K64.8, Other hemorrhoids                           K62.6, Ulcer of anus and rectum                           K92.1, Melena (includes Hematochezia) CPT copyright 2016 American Medical Association. All rights reserved. The codes documented in this report are preliminary and upon coder review may  be revised to meet current compliance requirements. Mauri Pole, MD 09/10/2017 2:13:01 PM This report has been signed electronically. Number of Addenda: 0

## 2017-09-10 NOTE — Progress Notes (Signed)
PROGRESS NOTE    Ryan Duke  YTK:[id number] DOB: [birth year] DOA: 09/05/2017 PCP: Janith Lima, MD    Brief Narrative: 76 y.o.malewith medical history significant of type 2 diabetes mellitus and hypertension, who was recently diagnosed with L2 compression fracture with 30% loss of height with no retropulsion, (September 21). He was discharged from emergency department with Vicodin and Zofran. He had a follow-up with neurosurgery on October 4th, apparently he was told that his fracture was healing well, no surgical intervention required. Hydrocodone was changed to oxycodone to improve pain control. Over the following3 days patient's pain has been persistent, sharp in nature, 10 out of 10 in intensity,localized lower back, worse with movement, no significant improvement with narcotics, associated with decreased by mouth intake, weight loss and confusion. Due to confusion he stopped taking narcotics about 48 hours ago, today his confusion was worse, now associated with hallucinations, and he was noted to beunable to get out of his bed by himself. His wife called 62 and he was brought tothe hospital for further evaluation. Interventional radiology consulted for kyphoplasty. Patient to have  kyphoplasty today at 1:30 PM. Patient also is supposed to undergo flexible sigmoidoscopy today for rectal bleeding or lower GI bleeding. Patient had couple episodes of bloody bowel movements last evening and overnight.  He is resting in bed at this time answered all my questions appropriately he was pleasant.   Assessment & Plan:   Active Problems:   Back pain   Closed compression fracture of L2 lumbar vertebra (HCC)   Hematochezia   Anemia, posthemorrhagic, acute   Constipation  Acute metabolic encephalopathy-improving. Patient on when necessary Haldol. Patient seen by neurology. Recommended SEROQUEL twice a day. Minimize narcotics due to increased confusion. Continue muscle relaxants as  needed.  L2 compression fracture-patient to go for kyphoplasty today.  Lower GI bleed-patient to go for flexible sigmoidoscopy today unable to do bleeding scan due to increasing confusion and noncooperative. Follow up labs tomorrow.  CK-MB stage III stable  Type 2 diabetes continue Lantus. Restart Januvia once patient stable.  Hypertension  on atenolol losartan and chlorthalidone. BP running low to low normal tapered the dose of antihypertensives.  BPH continue Flomax.   DVT prophylaxis:scd Code Status: full Family Communication: none Disposition Plan: tbd   Consultants:  Gi,ir Procedures:   Antimicrobials:  none  Subjective:no complaints  Objective: Vitals:   09/10/17 0200 09/10/17 0403 09/10/17 0521 09/10/17 0801  BP: 128/61  102/64   Pulse: 84  89   Resp: (!) 28  (!) 26   Temp:  98.4 F (36.9 C)  98.6 F (37 C)  TempSrc:  Oral  Oral  SpO2:      Weight:  100 kg (220 lb 7.4 oz)    Height:        Intake/Output Summary (Last 24 hours) at 09/10/17 1104 Last data filed at 09/10/17 0500  Gross per 24 hour  Intake             1050 ml  Output             1050 ml  Net                0 ml   Filed Weights   09/05/17 1030 09/05/17 2044 09/10/17 0403  Weight: 113.4 kg (250 lb) 100.7 kg (222 lb 0.1 oz) 100 kg (220 lb 7.4 oz)    Examination:  General exam: Appears calm and comfortable  Respiratory system: Clear to auscultation. Respiratory effort  normal. Cardiovascular system: S1 & S2 heard, RRR. No JVD, murmurs, rubs, gallops or clicks. No pedal edema. Gastrointestinal system: Abdomen is nondistended, soft and nontender. No organomegaly or masses felt. Normal bowel sounds heard. Central nervous system: Alert and oriented. No focal neurological deficits. Extremities: Symmetric 5 x 5 power. Skin: No rashes, lesions or ulcers Psychiatry: Judgement and insight appear normal. Mood & affect appropriate.     Data Reviewed: I have personally reviewed following labs  and imaging studies  CBC:  Recent Labs Lab 09/05/17 1124  09/08/17 0432 09/08/17 1554 09/08/17 1746 09/09/17 0329 09/10/17 0325  WBC 11.0*  < > 11.5* 11.4* 12.6* 12.5* 9.6  NEUTROABS 9.0*  --   --   --   --   --   --   HGB 16.6  < > 15.0 15.1 14.3 13.8 11.2*  HCT 47.6  < > 44.3 44.7 42.9 41.1 33.5*  MCV 92.8  < > 93.5 93.3 93.3 93.4 93.1  PLT 332  < > 310 257 386 341 292  < > = values in this interval not displayed. Basic Metabolic Panel:  Recent Labs Lab 09/06/17 0428 09/08/17 0432 09/08/17 1746 09/09/17 0329 09/10/17 0325  NA 139 140 138 138 138  K 3.8 2.7* 3.5 3.6 3.9  CL 102 101 101 103 107  CO2 27 28 24 24 22   GLUCOSE 120* 101* 221* 152* 106*  BUN 28* 19 20 30* 38*  CREATININE 1.39* 1.18 1.42* 1.52* 1.50*  CALCIUM 8.7* 9.1 8.9 9.0 8.4*  MG  --  2.1  --   --   --    GFR: Estimated Creatinine Clearance: 48.9 mL/min (A) (by C-G formula based on SCr of 1.5 mg/dL (H)). Liver Function Tests:  Recent Labs Lab 09/05/17 1124 09/08/17 0432 09/09/17 0329  AST 25 25 30   ALT 23 21 27   ALKPHOS 118 116 116  BILITOT 0.8 0.8 0.7  PROT 7.1 6.8 6.7  ALBUMIN 3.9 3.6 3.7   No results for input(s): LIPASE, AMYLASE in the last 168 hours. No results for input(s): AMMONIA in the last 168 hours. Coagulation Profile: No results for input(s): INR, PROTIME in the last 168 hours. Cardiac Enzymes:  Recent Labs Lab 09/07/17 1137  TROPONINI 0.04*   BNP (last 3 results) No results for input(s): PROBNP in the last 8760 hours. HbA1C: No results for input(s): HGBA1C in the last 72 hours. CBG:  Recent Labs Lab 09/08/17 1659 09/08/17 1732 09/08/17 2111 09/09/17 2159 09/10/17 0747  GLUCAP 101* 143* 149* 126* 102*   Lipid Profile: No results for input(s): CHOL, HDL, LDLCALC, TRIG, CHOLHDL, LDLDIRECT in the last 72 hours. Thyroid Function Tests: No results for input(s): TSH, T4TOTAL, FREET4, T3FREE, THYROIDAB in the last 72 hours. Anemia Panel: No results for input(s):  VITAMINB12, FOLATE, FERRITIN, TIBC, IRON, RETICCTPCT in the last 72 hours. Sepsis Labs: No results for input(s): PROCALCITON, LATICACIDVEN in the last 168 hours.  Recent Results (from the past 240 hour(s))  MRSA PCR Screening     Status: None   Collection Time: 09/08/17  5:23 PM  Result Value Ref Range Status   MRSA by PCR NEGATIVE NEGATIVE Final    Comment:        The GeneXpert MRSA Assay (FDA approved for NASAL specimens only), is one component of a comprehensive MRSA colonization surveillance program. It is not intended to diagnose MRSA infection nor to guide or monitor treatment for MRSA infections.          Radiology Studies:  No results found.      Scheduled Meds: . atenolol  100 mg Oral Daily   And  . chlorthalidone  25 mg Oral Daily  . diclofenac sodium  2 g Topical QID  . docusate sodium  100 mg Oral BID  . fentaNYL  50 mcg Transdermal Q72H  . insulin aspart  0-9 Units Subcutaneous TID WC  . insulin glargine  15 Units Subcutaneous QHS  . losartan  100 mg Oral Daily  . methocarbamol  500 mg Oral TID  . polyethylene glycol  17 g Oral Daily  . pravastatin  40 mg Oral Daily  . QUEtiapine  25 mg Oral Daily  . QUEtiapine  50 mg Oral q1800  . tamsulosin  0.4 mg Oral Daily   Continuous Infusions: . sodium chloride 50 mL/hr at 09/10/17 0500     LOS: 5 days       Georgette Shell, MD [facility]  If 7PM-7AM, please contact night-coverage [email or web address] Password [email or web address] 09/10/2017, 11:04 AM

## 2017-09-10 NOTE — Anesthesia Preprocedure Evaluation (Addendum)
Anesthesia Evaluation  Patient identified by MRN, date of birth, ID band Patient awake    Reviewed: Allergy & Precautions, H&P , NPO status , Patient's Chart, lab work & pertinent test results, reviewed documented beta blocker date and time   Airway Mallampati: II  TM Distance: >3 FB Neck ROM: Full    Dental no notable dental hx. (+) Dental Advisory Given   Pulmonary neg pulmonary ROS, former smoker,    Pulmonary exam normal breath sounds clear to auscultation       Cardiovascular hypertension, Pt. on medications and Pt. on home beta blockers + Peripheral Vascular Disease  + Valvular Problems/Murmurs AS  Rhythm:Regular Rate:Normal + Systolic murmurs EKG - anterolateral TWI  TTE 2016 - Severeconcentric LVH. Ejection fraction was in the range of 60% to 65%. Moderate AS with peak and meangradients of 55 and 30 mmHg. Estimated AVA is around 1.2 cm2, based on an LVOT diameter of 2.1 cm.  Trivial MR. Right atrium was mildly dilated.   Neuro/Psych Depression negative neurological ROS     GI/Hepatic Neg liver ROS, GERD  ,  Endo/Other  diabetes, Type 2, Insulin DependentObesity  Renal/GU Renal InsufficiencyRenal disease  negative genitourinary   Musculoskeletal Gout   Abdominal   Peds  Hematology negative hematology ROS (+) anemia ,   Anesthesia Other Findings   Reproductive/Obstetrics negative OB ROS                            Anesthesia Physical Anesthesia Plan  ASA: III  Anesthesia Plan: MAC   Post-op Pain Management:    Induction: Intravenous  PONV Risk Score and Plan: Propofol infusion and Treatment may vary due to age or medical condition  Airway Management Planned: Nasal Cannula  Additional Equipment: None  Intra-op Plan:   Post-operative Plan:   Informed Consent: I have reviewed the patients History and Physical, chart, labs and discussed the procedure including the  risks, benefits and alternatives for the proposed anesthesia with the patient or authorized representative who has indicated his/her understanding and acceptance.   Dental advisory given  Plan Discussed with: CRNA  Anesthesia Plan Comments:         Anesthesia Quick Evaluation

## 2017-09-10 NOTE — Progress Notes (Addendum)
Pt somewhat confused overnight, was able to answer orientation questions correctly; however around 0500 Pt began hallucinating and trying to get OOB. Pt seeing people/ having conversations with no one in room. Pt states they were "telling him to get OOB". Pt also picking at self and pulling on IVs/ Foley catheter. RN and NT redirected/ reoriented Pt and he his now resting in bed. Will continue to monitor.

## 2017-09-10 NOTE — Progress Notes (Signed)
PT Cancellation Note  Patient Details Name: Ryan Duke MRN: [id number] DOB: 06/22/1941   Cancelled Treatment:    Reason Eval/Treat Not Completed: Patient at procedure or test/unavailable   Kelsie Zaborowski,KATHrine E 09/10/2017, 1:05 PM Carmelia Bake, PT, DPT 09/10/2017 Pager: [phone]

## 2017-09-10 NOTE — Interval H&P Note (Signed)
History and Physical Interval Note:  09/10/2017 1:30 PM  Ryan Duke  has presented today for surgery, with the diagnosis of Rectal bleeding  The various methods of treatment have been discussed with the patient and family. After consideration of risks, benefits and other options for treatment, the patient has consented to  Procedure(s): FLEXIBLE SIGMOIDOSCOPY (N/A) as a surgical intervention .  The patient's history has been reviewed, patient examined, no change in status, stable for surgery.  I have reviewed the patient's chart and labs.  Questions were answered to the patient's satisfaction.     Rohen Kimes

## 2017-09-10 NOTE — Progress Notes (Deleted)
Tap water enema completed. Pt was able to retain enema for 6 minutes. Enema output was brown liquid and some soft brown stool. Dr. Silverio Decamp made aware of output.

## 2017-09-10 NOTE — Transfer of Care (Signed)
Immediate Anesthesia Transfer of Care Note  Patient: Ryan Duke  Procedure(s) Performed: FLEXIBLE SIGMOIDOSCOPY (N/A )  Patient Location: PACU  Anesthesia Type:MAC  Level of Consciousness: sedated, patient cooperative and responds to stimulation  Airway & Oxygen Therapy: Patient Spontanous Breathing and Patient connected to face mask oxygen  Post-op Assessment: Report given to RN and Post -op Vital signs reviewed and stable  Post vital signs: Reviewed and stable  Last Vitals:  Vitals:   09/10/17 1233 09/10/17 1235  BP:  (!) 153/57  Pulse:  80  Resp:  (!) 21  Temp: 36.8 C   SpO2:  97%    Last Pain:  Vitals:   09/10/17 1233  TempSrc: Oral  PainSc:       Patients Stated Pain Goal: 6 (93/26/71 2458)  Complications: No apparent anesthesia complications

## 2017-09-10 NOTE — H&P (View-Only) (Signed)
     [facility] Progress Note  CC:  Rectal bleeding  Subjective:  Patient extremely confused, agitated, aggressive, combative overnight.  Refusing nursing care, trying to get out of bed, swinging at the nurses.  Was not able to hold still to even attempt the bleeding scan.  Just received a dose of Haldol.  Per nursing staff, his bleeding has significantly slowed with only one small episode overnight.  He is actually completely oriented at this time and just angry about the mittens, etc.  Objective:  Vital signs in last 24 hours: Temp:  [98.2 F (36.8 C)-98.7 F (37.1 C)] 98.2 F (36.8 C) (10/11 0400) Pulse Rate:  [71-95] 71 (10/11 0800) Resp:  [16-30] 23 (10/11 0800) BP: (98-158)/(43-93) 103/52 (10/11 0700) SpO2:  [95 %-100 %] 95 % (10/11 0800) Last BM Date: 09/08/17 General:  Alert, Well-developed, in NAD Neurologic:  Alert and oriented x 3;  grossly normal neurologically.  Patient not examined otherwise since he had been combative and I was advised to keep my distance.  Intake/Output from previous day: 10/10 0701 - 10/11 0700 In: 670 [P.O.:120; I.V.:550] Out: 750 [Urine:750] Intake/Output this shift: Total I/O In: 1500 [I.V.:1500] Out: -   Lab Results:  Recent Labs  09/08/17 1554 09/08/17 1746 09/09/17 0329  WBC 11.4* 12.6* 12.5*  HGB 15.1 14.3 13.8  HCT 44.7 42.9 41.1  PLT 257 386 341   BMET  Recent Labs  09/08/17 0432 09/08/17 1746 09/09/17 0329  NA 140 138 138  K 2.7* 3.5 3.6  CL 101 101 103  CO2 28 24 24   GLUCOSE 101* 221* 152*  BUN 19 20 30*  CREATININE 1.18 1.42* 1.52*  CALCIUM 9.1 8.9 9.0   LFT  Recent Labs  09/09/17 0329  PROT 6.7  ALBUMIN 3.7  AST 30  ALT 27  ALKPHOS 116  BILITOT 0.7   Assessment / Plan: *GI bleed:  Highly suspicious for diverticular bleed.  Hopefully self limited as it has significantly slowed, possibly even stopped.  Not able to have bleeding scan due to agitation.  Hgb is still normal.  -Will  plan for flexible sigmoidoscopy tentatively on 10/12 at 1330.  Will receive 2 tap water enemas tomorrow prior to the procedure.   -CT scan abdomen and pelvis without contrast ordered for today.  Will not show source of bleeding, but can be performed if there are other concerns. -At most clear liquids today and then NPO after midnight. -Monitor Hgb.   LOS: 4 days   ZEHR, JESSICA D.  09/09/2017, 9:40 AM  Pager number [phone]   Attending physician's note   I have taken an interval history, reviewed the chart and examined the patient. I agree with the Advanced Practitioner's note, impression and recommendations.  Hgb trended down to 12, hemodynamically stable Lower GI bleed. Differential includes diverticular hemorrhage vs stercoral rectal ulcer  Patient was extremely agitated and delirious last night and couldn't undergo RBC tag scan Will plan for flex sig tomorrow to evaluate and intervene therapeutically if needed The risks and benefits as well as alternatives of endoscopic procedure(s) have been discussed and reviewed. All questions answered. Patient's wife agrees to proceed.  Damaris Hippo, MD [phone] Mon-Fri 8a-5p [phone] after 5p, weekends, holidays

## 2017-09-11 LAB — BASIC METABOLIC PANEL
ANION GAP: 10 (ref 5–15)
BUN: 27 mg/dL — AB (ref 6–20)
CALCIUM: 8.5 mg/dL — AB (ref 8.9–10.3)
CO2: 23 mmol/L (ref 22–32)
Chloride: 105 mmol/L (ref 101–111)
Creatinine, Ser: 1.32 mg/dL — ABNORMAL HIGH (ref 0.61–1.24)
GFR calc Af Amer: 58 mL/min — ABNORMAL LOW (ref >60)
GFR calc non Af Amer: 50 mL/min — ABNORMAL LOW (ref >60)
GLUCOSE: 108 mg/dL — AB (ref 65–99)
Potassium: 3.5 mmol/L (ref 3.5–5.1)
Sodium: 138 mmol/L (ref 135–145)

## 2017-09-11 LAB — GLUCOSE, CAPILLARY
GLUCOSE-CAPILLARY: 112 mg/dL — AB (ref 65–99)
GLUCOSE-CAPILLARY: 136 mg/dL — AB (ref 65–99)
Glucose-Capillary: 88 mg/dL (ref 65–99)
Glucose-Capillary: 99 mg/dL (ref 65–99)

## 2017-09-11 LAB — CBC
HEMATOCRIT: 32 % — AB (ref 39.0–52.0)
Hemoglobin: 10.7 g/dL — ABNORMAL LOW (ref 13.0–17.0)
MCH: 31.1 pg (ref 26.0–34.0)
MCHC: 33.4 g/dL (ref 30.0–36.0)
MCV: 93 fL (ref 78.0–100.0)
PLATELETS: 300 10*3/uL (ref 150–400)
RBC: 3.44 MIL/uL — ABNORMAL LOW (ref 4.22–5.81)
RDW: 12.6 % (ref 11.5–15.5)
WBC: 9.9 10*3/uL (ref 4.0–10.5)

## 2017-09-11 MED ORDER — HALOPERIDOL LACTATE 5 MG/ML IJ SOLN
2.0000 mg | Freq: Four times a day (QID) | INTRAMUSCULAR | Status: AC | PRN
  Administered 2017-09-11 – 2017-09-12 (×2): 2 mg via INTRAVENOUS
  Filled 2017-09-11 (×2): qty 1

## 2017-09-11 MED ORDER — MESALAMINE 1000 MG RE SUPP
1000.0000 mg | Freq: Every day | RECTAL | Status: AC
  Administered 2017-09-11 – 2017-09-15 (×5): 1000 mg via RECTAL
  Filled 2017-09-11 (×5): qty 1

## 2017-09-11 NOTE — Progress Notes (Signed)
Pt has been very agitated during the night (requiring prn Haldol) trying to get out of bed and pulling at his equipment.  Screaming at staff and wanting to go home.

## 2017-09-11 NOTE — Progress Notes (Addendum)
Patient ID: Ryan Duke, male   DOB: 04-20-1941, 76 y.o.   MRN: [id number]  PROGRESS NOTE    Ryan Duke  GEX:[id number] DOB: November 21, 1941 DOA: 09/05/2017  PCP: Janith Lima, MD   Brief Narrative:  76 year old male with DM, hypertension, L2 compression fracture, no surgical intervention was required at that time back in 07/2017. His pain has however persisted. He has not underwent kyphoplasty yet due to altered mental status, agitation. He did have flex sig 10/12 for lower GI bleed but no acute issues identified other than stercoral ulcers and non-bleeding internal hemorrhoids.   Assessment & Plan:   Active Problems:   Back pain / Closed compression fracture of L2 lumbar vertebra (HCC) - IR consulted for kyphoplasty but not yet done due to pt altered mental status     Acute metabolic encephalopathy - Still intermittently agitated - On haldol and Seroquel     Hematochezia / Acute blood loss anemia  - Had flex sigmoidoscopy 10/12 - Has what looks like stercorall ulcers, non-bleeding internal hemorrhoids  - Continue management of constipation    DM with diabetic nephropathy - Continue current insulin regimen    CKD stage 3  - Cr  1.32, stable since admission     Constipation - Use benefiber, miralax - Use Canasa 1000 mg suppository 1 per rectum for 5 days  - No naproxen or ibuprofen or other NSAID's   DVT prophylaxis: SCD's Code Status: full code  Family Communication: no family at the bedside  Disposition Plan: transfer to telemetry floor   Consultants:   GI, Dr. Silverio Decamp  IR for kyphoplasty   Procedures:   Flex sig 10/12 - few ulcers in the distal rectum consistent with stercoral ulcers. Non-bleeding internal hemorrhoids  Antimicrobials:   None     Subjective: Agitated overnight, needed haldol. Now calm.  Objective: Vitals:   09/11/17 0404 09/11/17 0453 09/11/17 0600 09/11/17 0800  BP:  (!) 121/56 (!) 142/55 (!) 158/52  Pulse:  99 93   Resp:   (!) 24 16 (!) 21  Temp: 97.6 F (36.4 C)   98.6 F (37 C)  TempSrc: Oral   Axillary  SpO2:      Weight:      Height:        Intake/Output Summary (Last 24 hours) at 09/11/17 0830 Last data filed at 09/11/17 0800  Gross per 24 hour  Intake          1396.67 ml  Output             1250 ml  Net           146.67 ml   Filed Weights   09/05/17 2044 09/10/17 0403 09/10/17 1248  Weight: 100.7 kg (222 lb 0.1 oz) 100 kg (220 lb 7.4 oz) 100.7 kg (222 lb)    Examination:  General exam: Appears calm and comfortable  Respiratory system: Clear to auscultation. Respiratory effort normal. Cardiovascular system: S1 & S2 heard, Rate controlled  Gastrointestinal system: Abdomen is nondistended, soft and nontender. No organomegaly or masses felt. Normal bowel sounds heard. Central nervous system: No focal neurological deficits. Extremities: Symmetric 5 x 5 power. Skin: No rashes, lesions or ulcers Psychiatry: Judgement and insight appear normal. Mood & affect appropriate.   Data Reviewed: I have personally reviewed following labs and imaging studies  CBC:  Recent Labs Lab 09/05/17 1124  09/08/17 1554 09/08/17 1746 09/09/17 0329 09/10/17 0325 09/11/17 0339  WBC 11.0*  < > 11.4*  12.6* 12.5* 9.6 9.9  NEUTROABS 9.0*  --   --   --   --   --   --   HGB 16.6  < > 15.1 14.3 13.8 11.2* 10.7*  HCT 47.6  < > 44.7 42.9 41.1 33.5* 32.0*  MCV 92.8  < > 93.3 93.3 93.4 93.1 93.0  PLT 332  < > 257 386 341 292 300  < > = values in this interval not displayed. Basic Metabolic Panel:  Recent Labs Lab 09/08/17 0432 09/08/17 1746 09/09/17 0329 09/10/17 0325 09/11/17 0339  NA 140 138 138 138 138  K 2.7* 3.5 3.6 3.9 3.5  CL 101 101 103 107 105  CO2 28 24 24 22 23   GLUCOSE 101* 221* 152* 106* 108*  BUN 19 20 30* 38* 27*  CREATININE 1.18 1.42* 1.52* 1.50* 1.32*  CALCIUM 9.1 8.9 9.0 8.4* 8.5*  MG 2.1  --   --   --   --    GFR: Estimated Creatinine Clearance: 55.8 mL/min (A) (by C-G formula  based on SCr of 1.32 mg/dL (H)). Liver Function Tests:  Recent Labs Lab 09/05/17 1124 09/08/17 0432 09/09/17 0329  AST 25 25 30   ALT 23 21 27   ALKPHOS 118 116 116  BILITOT 0.8 0.8 0.7  PROT 7.1 6.8 6.7  ALBUMIN 3.9 3.6 3.7   No results for input(s): LIPASE, AMYLASE in the last 168 hours. No results for input(s): AMMONIA in the last 168 hours. Coagulation Profile: No results for input(s): INR, PROTIME in the last 168 hours. Cardiac Enzymes:  Recent Labs Lab 09/07/17 1137  TROPONINI 0.04*   BNP (last 3 results) No results for input(s): PROBNP in the last 8760 hours. HbA1C: No results for input(s): HGBA1C in the last 72 hours. CBG:  Recent Labs Lab 09/10/17 0747 09/10/17 1139 09/10/17 1602 09/10/17 2200 09/11/17 0737  GLUCAP 102* 107* 95 132* 88   Lipid Profile: No results for input(s): CHOL, HDL, LDLCALC, TRIG, CHOLHDL, LDLDIRECT in the last 72 hours. Thyroid Function Tests: No results for input(s): TSH, T4TOTAL, FREET4, T3FREE, THYROIDAB in the last 72 hours. Anemia Panel: No results for input(s): VITAMINB12, FOLATE, FERRITIN, TIBC, IRON, RETICCTPCT in the last 72 hours. Urine analysis:    Component Value Date/Time   COLORURINE YELLOW 09/05/2017 1702   APPEARANCEUR HAZY (A) 09/05/2017 1702   LABSPEC 1.030 09/05/2017 1702   PHURINE 5.0 09/05/2017 1702   GLUCOSEU NEGATIVE 09/05/2017 1702   GLUCOSEU NEGATIVE 03/15/2017 1002   HGBUR NEGATIVE 09/05/2017 1702   BILIRUBINUR NEGATIVE 09/05/2017 1702   KETONESUR NEGATIVE 09/05/2017 1702   PROTEINUR 100 (A) 09/05/2017 1702   UROBILINOGEN 0.2 03/15/2017 1002   NITRITE NEGATIVE 09/05/2017 1702   LEUKOCYTESUR NEGATIVE 09/05/2017 1702   Sepsis Labs: @LABRCNTIP (procalcitonin:4,lacticidven:4)   ) Recent Results (from the past 240 hour(s))  MRSA PCR Screening     Status: None   Collection Time: 09/08/17  5:23 PM  Result Value Ref Range Status   MRSA by PCR NEGATIVE NEGATIVE Final    Comment:        The  GeneXpert MRSA Assay (FDA approved for NASAL specimens only), is one component of a comprehensive MRSA colonization surveillance program. It is not intended to diagnose MRSA infection nor to guide or monitor treatment for MRSA infections.       Radiology Studies: No results found.      Scheduled Meds: . atenolol  50 mg Oral Daily  . diclofenac sodium  2 g Topical QID  . docusate  sodium  100 mg Oral BID  . fentaNYL  50 mcg Transdermal Q72H  . insulin aspart  0-9 Units Subcutaneous TID WC  . insulin glargine  15 Units Subcutaneous QHS  . methocarbamol  500 mg Oral TID  . polycarbophil  625 mg Oral TID WC  . polyethylene glycol  17 g Oral TID WC  . pravastatin  40 mg Oral Daily  . QUEtiapine  25 mg Oral Daily  . QUEtiapine  50 mg Oral q1800  . tamsulosin  0.4 mg Oral Daily   Continuous Infusions: . sodium chloride 50 mL/hr at 09/11/17 0800     LOS: 6 days    Time spent: 25 minutes  Greater than 50% of the time spent on counseling and coordinating the care.   Leisa Lenz, MD [facility] Pager [phone]  If 7PM-7AM, please contact night-coverage [email or web address] Password [email or web address] 09/11/2017, 8:30 AM

## 2017-09-12 ENCOUNTER — Encounter (HOSPITAL_COMMUNITY): Payer: Self-pay | Admitting: Gastroenterology

## 2017-09-12 LAB — GLUCOSE, CAPILLARY
GLUCOSE-CAPILLARY: 113 mg/dL — AB (ref 65–99)
GLUCOSE-CAPILLARY: 76 mg/dL (ref 65–99)
GLUCOSE-CAPILLARY: 127 mg/dL — AB (ref 65–99)
Glucose-Capillary: 96 mg/dL (ref 65–99)

## 2017-09-12 LAB — BASIC METABOLIC PANEL
ANION GAP: 7 (ref 5–15)
BUN: 21 mg/dL — ABNORMAL HIGH (ref 6–20)
CALCIUM: 8.8 mg/dL — AB (ref 8.9–10.3)
CHLORIDE: 106 mmol/L (ref 101–111)
CO2: 27 mmol/L (ref 22–32)
Creatinine, Ser: 1.07 mg/dL (ref 0.61–1.24)
GFR calc non Af Amer: >60 mL/min (ref >60)
Glucose, Bld: 113 mg/dL — ABNORMAL HIGH (ref 65–99)
Potassium: 3.1 mmol/L — ABNORMAL LOW (ref 3.5–5.1)
Sodium: 140 mmol/L (ref 135–145)

## 2017-09-12 LAB — CBC
HEMATOCRIT: 31.1 % — AB (ref 39.0–52.0)
HEMOGLOBIN: 10.5 g/dL — AB (ref 13.0–17.0)
MCH: 31.3 pg (ref 26.0–34.0)
MCHC: 33.8 g/dL (ref 30.0–36.0)
MCV: 92.8 fL (ref 78.0–100.0)
Platelets: 309 10*3/uL (ref 150–400)
RBC: 3.35 MIL/uL — AB (ref 4.22–5.81)
RDW: 12.5 % (ref 11.5–15.5)
WBC: 8.8 10*3/uL (ref 4.0–10.5)

## 2017-09-12 MED ORDER — POTASSIUM CHLORIDE CRYS ER 20 MEQ PO TBCR
40.0000 meq | EXTENDED_RELEASE_TABLET | Freq: Once | ORAL | Status: AC
  Administered 2017-09-12: 40 meq via ORAL

## 2017-09-12 NOTE — Progress Notes (Signed)
Patient ID: Ryan Duke, male   DOB: 20-Nov-1941, 76 y.o.   MRN: [id number]  PROGRESS NOTE    Ryan Duke  KCL:[id number] DOB: March 13, 1941 DOA: 09/05/2017  PCP: Ryan Lima, MD   Brief Narrative:  76 year old male with DM, hypertension, L2 compression fracture, no surgical intervention was required at that time back in 07/2017. His pain has however persisted. He has not underwent kyphoplasty yet due to altered mental status, agitation. He did have flex sig 10/12 for lower GI bleed but no acute issues identified other than stercoral ulcers and non-bleeding internal hemorrhoids.   Assessment & Plan:   Active Problems:   Back pain / Closed compression fracture of L2 lumbar vertebra (HCC) - Kyphoplasty not yet done due to pt altered mental status     Acute metabolic encephalopathy - On haldol and Seroquel     Hematochezia / Acute blood loss anemia  - Had flex sigmoidoscopy 10/12 - stercoral ulcers, non- bleeding internal hemorrhoids - Had 1 BM in last 24 hours    DM with diabetic nephropathy - CBG's in past 24 hours: 112, 136, 96 - Continue current insulin regimen     CKD stage 3  - Cr stable and WNL this am    Hypokalemia - Supplemented - Follow up BMP in am    Constipation - Continue current bowel regimen - Canasa for 5 days (started 10/13) - No naproxen or ibuprofen or other NSAID's   DVT prophylaxis: SCD's Code Status: full code  Family Communication: no family at the bedside  Disposition Plan: PT eval pending    Consultants:   GI, Dr. Silverio Duke  IR for kyphoplasty   PT  Procedures:   Flex sig 10/12 - few ulcers in the distal rectum consistent with stercoral ulcers. Non-bleeding internal hemorrhoids  Antimicrobials:   None     Subjective: No overnight events.  Objective: Vitals:   09/11/17 1133 09/11/17 1317 09/11/17 2114 09/12/17 0344  BP:  (!) 148/69 (!) 156/73 (!) 160/81  Pulse:  77 95 70  Resp:  20 20 20   Temp: 98.4 F (36.9 C)  98.7 F (37.1 C) 98.8 F (37.1 C) 98.3 F (36.8 C)  TempSrc: Oral Oral Oral Oral  SpO2:  98% 95% 92%  Weight:      Height:        Intake/Output Summary (Last 24 hours) at 09/12/17 1134 Last data filed at 09/12/17 0340  Gross per 24 hour  Intake              810 ml  Output             1075 ml  Net             -265 ml   Filed Weights   09/05/17 2044 09/10/17 0403 09/10/17 1248  Weight: 100.7 kg (222 lb 0.1 oz) 100 kg (220 lb 7.4 oz) 100.7 kg (222 lb)    Physical Exam  Constitutional: Appears well-developed and well-nourished. No distress.  CVS: Rate controlled, S1/S2 (+) Pulmonary: Effort and breath sounds normal, no stridor, rhonchi, wheezes, rales.  Abdominal: Soft. BS +,  no distension, tenderness, rebound or guarding.  Musculoskeletal: Normal range of motion. No edema and no tenderness.  Lymphadenopathy: No lymphadenopathy noted, cervical, inguinal. Neuro: Alert. No focal deficits  Skin: Skin is warm and dry.  Psychiatric: Little restless     Data Reviewed: I have personally reviewed following labs and imaging studies  CBC:  Recent Labs Lab 09/08/17  1746 09/09/17 0329 09/10/17 0325 09/11/17 0339 09/12/17 0456  WBC 12.6* 12.5* 9.6 9.9 8.8  HGB 14.3 13.8 11.2* 10.7* 10.5*  HCT 42.9 41.1 33.5* 32.0* 31.1*  MCV 93.3 93.4 93.1 93.0 92.8  PLT 386 341 292 300 381   Basic Metabolic Panel:  Recent Labs Lab 09/08/17 0432 09/08/17 1746 09/09/17 0329 09/10/17 0325 09/11/17 0339 09/12/17 0456  NA 140 138 138 138 138 140  K 2.7* 3.5 3.6 3.9 3.5 3.1*  CL 101 101 103 107 105 106  CO2 28 24 24 22 23 27   GLUCOSE 101* 221* 152* 106* 108* 113*  BUN 19 20 30* 38* 27* 21*  CREATININE 1.18 1.42* 1.52* 1.50* 1.32* 1.07  CALCIUM 9.1 8.9 9.0 8.4* 8.5* 8.8*  MG 2.1  --   --   --   --   --    GFR: Estimated Creatinine Clearance: 68.8 mL/min (by C-G formula based on SCr of 1.07 mg/dL). Liver Function Tests:  Recent Labs Lab 09/08/17 0432 09/09/17 0329  AST 25 30    ALT 21 27  ALKPHOS 116 116  BILITOT 0.8 0.7  PROT 6.8 6.7  ALBUMIN 3.6 3.7   No results for input(s): LIPASE, AMYLASE in the last 168 hours. No results for input(s): AMMONIA in the last 168 hours. Coagulation Profile: No results for input(s): INR, PROTIME in the last 168 hours. Cardiac Enzymes:  Recent Labs Lab 09/07/17 1137  TROPONINI 0.04*   BNP (last 3 results) No results for input(s): PROBNP in the last 8760 hours. HbA1C: No results for input(s): HGBA1C in the last 72 hours. CBG:  Recent Labs Lab 09/11/17 0737 09/11/17 1126 09/11/17 1628 09/11/17 2111 09/12/17 0744  GLUCAP 88 99 112* 136* 96   Lipid Profile: No results for input(s): CHOL, HDL, LDLCALC, TRIG, CHOLHDL, LDLDIRECT in the last 72 hours. Thyroid Function Tests: No results for input(s): TSH, T4TOTAL, FREET4, T3FREE, THYROIDAB in the last 72 hours. Anemia Panel: No results for input(s): VITAMINB12, FOLATE, FERRITIN, TIBC, IRON, RETICCTPCT in the last 72 hours. Urine analysis:    Component Value Date/Time   COLORURINE YELLOW 09/05/2017 1702   APPEARANCEUR HAZY (A) 09/05/2017 1702   LABSPEC 1.030 09/05/2017 1702   PHURINE 5.0 09/05/2017 1702   GLUCOSEU NEGATIVE 09/05/2017 1702   GLUCOSEU NEGATIVE 03/15/2017 1002   HGBUR NEGATIVE 09/05/2017 1702   BILIRUBINUR NEGATIVE 09/05/2017 1702   KETONESUR NEGATIVE 09/05/2017 1702   PROTEINUR 100 (A) 09/05/2017 1702   UROBILINOGEN 0.2 03/15/2017 1002   NITRITE NEGATIVE 09/05/2017 1702   LEUKOCYTESUR NEGATIVE 09/05/2017 1702   Sepsis Labs: @LABRCNTIP (procalcitonin:4,lacticidven:4)   ) Recent Results (from the past 240 hour(s))  MRSA PCR Screening     Status: None   Collection Time: 09/08/17  5:23 PM  Result Value Ref Range Status   MRSA by PCR NEGATIVE NEGATIVE Final    Comment:        The GeneXpert MRSA Assay (FDA approved for NASAL specimens only), is one component of a comprehensive MRSA colonization surveillance program. It is not intended to  diagnose MRSA infection nor to guide or monitor treatment for MRSA infections.       Radiology Studies: No results found.      Scheduled Meds: . atenolol  50 mg Oral Daily  . diclofenac sodium  2 g Topical QID  . docusate sodium  100 mg Oral BID  . fentaNYL  50 mcg Transdermal Q72H  . insulin aspart  0-9 Units Subcutaneous TID WC  . insulin glargine  15 Units Subcutaneous QHS  . mesalamine  1,000 mg Rectal QHS  . methocarbamol  500 mg Oral TID  . polycarbophil  625 mg Oral TID WC  . polyethylene glycol  17 g Oral TID WC  . pravastatin  40 mg Oral Daily  . QUEtiapine  25 mg Oral Daily  . QUEtiapine  50 mg Oral q1800  . tamsulosin  0.4 mg Oral Daily   Continuous Infusions: . sodium chloride 50 mL/hr at 09/11/17 1100     LOS: 7 days    Time spent: 25 minutes  Greater than 50% of the time spent on counseling and coordinating the care.   Leisa Lenz, MD [facility] Pager [phone]  If 7PM-7AM, please contact night-coverage [email or web address] Password [email or web address] 09/12/2017, 11:34 AM

## 2017-09-12 NOTE — Progress Notes (Signed)
PT Cancellation Note  Patient Details Name: Ryan Duke MRN: [id number] DOB: 1941-11-24   Cancelled Treatment:     chart reviewed and noted MRI showed increase in compression fracture height loss from 30% to 50% , and wife stated "PT was not supposed to get him out of bed because of this , or until the kyphoplasty". Pt's wife also would like to talk to an MD today.   I did call Dr. Charlies Silvers and asked for activity clarification and that wife would like to speak to her.    MD: please clarify on activity for this pateint.  Clide Dales 09/12/2017, 5:51 PM  Clide Dales, PT Pager: [phone] 09/12/2017

## 2017-09-13 LAB — BASIC METABOLIC PANEL
ANION GAP: 10 (ref 5–15)
BUN: 16 mg/dL (ref 6–20)
CALCIUM: 8.8 mg/dL — AB (ref 8.9–10.3)
CO2: 26 mmol/L (ref 22–32)
CREATININE: 1.01 mg/dL (ref 0.61–1.24)
Chloride: 101 mmol/L (ref 101–111)
Glucose, Bld: 115 mg/dL — ABNORMAL HIGH (ref 65–99)
Potassium: 3.4 mmol/L — ABNORMAL LOW (ref 3.5–5.1)
SODIUM: 137 mmol/L (ref 135–145)

## 2017-09-13 LAB — CBC
HCT: 33.6 % — ABNORMAL LOW (ref 39.0–52.0)
HEMOGLOBIN: 11.5 g/dL — AB (ref 13.0–17.0)
MCH: 31.6 pg (ref 26.0–34.0)
MCHC: 34.2 g/dL (ref 30.0–36.0)
MCV: 92.3 fL (ref 78.0–100.0)
PLATELETS: 307 10*3/uL (ref 150–400)
RBC: 3.64 MIL/uL — AB (ref 4.22–5.81)
RDW: 12.6 % (ref 11.5–15.5)
WBC: 10 10*3/uL (ref 4.0–10.5)

## 2017-09-13 LAB — GLUCOSE, CAPILLARY
GLUCOSE-CAPILLARY: 82 mg/dL (ref 65–99)
GLUCOSE-CAPILLARY: 143 mg/dL — AB (ref 65–99)
GLUCOSE-CAPILLARY: 104 mg/dL — AB (ref 65–99)
Glucose-Capillary: 85 mg/dL (ref 65–99)

## 2017-09-13 MED ORDER — SENNA 8.6 MG PO TABS: 1.0000 | ORAL_TABLET | Freq: Every evening | ORAL | Status: DC | PRN

## 2017-09-13 MED ORDER — LORAZEPAM 2 MG/ML IJ SOLN
1.0000 mg | Freq: Once | INTRAMUSCULAR | Status: AC
  Administered 2017-09-13: 1 mg via INTRAVENOUS
  Filled 2017-09-13: qty 1

## 2017-09-13 MED ORDER — POTASSIUM CHLORIDE CRYS ER 20 MEQ PO TBCR
40.0000 meq | EXTENDED_RELEASE_TABLET | Freq: Once | ORAL | Status: AC
  Administered 2017-09-13: 40 meq via ORAL
  Filled 2017-09-13: qty 2

## 2017-09-13 MED ORDER — CEFAZOLIN SODIUM-DEXTROSE 2-4 GM/100ML-% IV SOLN
2.0000 g | INTRAVENOUS | Status: AC
  Administered 2017-09-14: 2 g via INTRAVENOUS

## 2017-09-13 NOTE — Progress Notes (Signed)
OT Cancellation Note  Patient Details Name: Ryan Duke MRN: [id number] DOB: [birth year]   Cancelled Treatment:      Medical issues which prohibited therapy Pt's wife states Dr. Charlies Silvers told her that pt  Is to remain in bed due to closed compression fracture of L2 lumbar vertebra with plans for KP tomorrow by IR.   Betsy Pries 09/13/2017, 2:10 PM

## 2017-09-13 NOTE — Progress Notes (Signed)
Referring Physician(s): Devine,A  Supervising Physician: Jacqulynn Cadet  Patient Status:  [facility] - In-pt  Chief Complaint:  Back pain, L2 compression fracture  Subjective: Patient familiar to IR service from recent consultation on 09/07/17 for L2 kyphoplasty .Due to pt agitation and onset of GI bleeding procedure was postponed. GI workup revealed no acute issues other than stercoral ulcers and nonbleeding internal hemorrhoids. Patient appears to be much more calm today but continues to have back pain exacerbated by activity. He is on Haldol and Seroquel. He did have some nausea and vomiting earlier this morning but is currently stable. He denies fever, headache, chest pain, dyspnea, cough, significant abdominal pain. L2 kyphoplasty has now been reordered. Past Medical History:  Diagnosis Date  . Diabetes mellitus    type 2  . Hyperlipidemia   . Hypertension   . Hypogonadism, male   . Pancreatitis 2008  . Renal insufficiency    Past Surgical History:  Procedure Laterality Date  . FLEXIBLE SIGMOIDOSCOPY N/A 09/10/2017   Procedure: FLEXIBLE SIGMOIDOSCOPY;  Surgeon: Mauri Pole, MD;  Location: [facility] ENDOSCOPY;  Service: Endoscopy;  Laterality: N/A;      Allergies: Farxiga [dapagliflozin]; Metformin and related; Atorvastatin; and Benazepril hcl  Medications: Prior to Admission medications   Medication Sig Start Date End Date Taking? Authorizing Provider  atenolol-chlorthalidone (TENORETIC) 100-25 MG tablet Take 1 tablet by mouth daily. 09/03/17  Yes Janith Lima, MD  cyclobenzaprine (FLEXERIL) 10 MG tablet Take 10 mg by mouth 3 (three) times daily.   Yes [provider]  HYDROcodone-acetaminophen (NORCO/VICODIN) 5-325 MG tablet Take 1-2 tablets by mouth every 6 (six) hours as needed. Patient taking differently: Take 1-2 tablets by mouth every 6 (six) hours as needed for moderate pain.  08/21/17  Yes Ward, Delice Bison, DO  Insulin Glargine (TOUJEO SOLOSTAR) 300  UNIT/ML SOPN Inject 30 Units into the skin daily. Patient taking differently: Inject 30 Units into the skin at bedtime.  06/18/17  Yes Janith Lima, MD  JANUVIA 100 MG tablet TAKE 1 TABLET ONCE DAILY. 08/28/17  Yes Janith Lima, MD  losartan (COZAAR) 100 MG tablet Take 1 tablet (100 mg total) by mouth daily. 09/03/17  Yes Janith Lima, MD  ondansetron (ZOFRAN ODT) 4 MG disintegrating tablet Take 1 tablet (4 mg total) by mouth every 8 (eight) hours as needed for nausea or vomiting. 08/21/17  Yes Ward, Delice Bison, DO  oxyCODONE-acetaminophen (PERCOCET/ROXICET) 5-325 MG tablet Take 0.5-1 tablets by mouth 4 (four) times daily as needed for moderate pain.  09/02/17  Yes [provider]  pravastatin (PRAVACHOL) 40 MG tablet TAKE 1 TABLET ONCE DAILY. 12/07/16  Yes Janith Lima, MD  fentaNYL (DURAGESIC - DOSED MCG/HR) 50 MCG/HR Place 1 patch (50 mcg total) onto the skin every 3 (three) days. 09/05/17   Dorie Rank, MD  Lancets MISC Test blood sugar twice daily as directed. Dx:250.02 10/04/12   Janith Lima, MD     Vital Signs: BP (!) 157/82 (BP Location: Left Arm)   Pulse 82   Temp 98 F (36.7 C) (Oral)   Resp 18   Ht 5\' 11"  (1.803 m)   Wt 222 lb (100.7 kg)   SpO2 96%   BMI 30.96 kg/m   Physical Exam patient awake, responds to questions appropriately. Wife in room. Chest with clear breath sounds bilaterally. Heart with regular rate and rhythm; abdomen obese, soft, positive bowel sounds, nontender; extremities with intact sensory and motor function.  Imaging: No results  found.  Labs:  CBC:  Recent Labs  09/10/17 0325 09/11/17 0339 09/12/17 0456 09/13/17 0512  WBC 9.6 9.9 8.8 10.0  HGB 11.2* 10.7* 10.5* 11.5*  HCT 33.5* 32.0* 31.1* 33.6*  PLT 292 300 309 307    COAGS: No results for input(s): INR, APTT in the last 8760 hours.  BMP:  Recent Labs  09/10/17 0325 09/11/17 0339 09/12/17 0456 09/13/17 0512  NA 138 138 140 137  K 3.9 3.5 3.1* 3.4*  CL 107 105  106 101  CO2 22 23 27 26   GLUCOSE 106* 108* 113* 115*  BUN 38* 27* 21* 16  CALCIUM 8.4* 8.5* 8.8* 8.8*  CREATININE 1.50* 1.32* 1.07 1.01  GFRNONAA 43* 50* >60 >60  GFRAA 50* 58* >60 >60    LIVER FUNCTION TESTS:  Recent Labs  08/20/17 2255 09/05/17 1124 09/08/17 0432 09/09/17 0329  BILITOT 0.8 0.8 0.8 0.7  AST 26 25 25 30   ALT 24 23 21 27   ALKPHOS 49 118 116 116  PROT 6.4* 7.1 6.8 6.7  ALBUMIN 3.8 3.9 3.6 3.7    Assessment and Plan: Patient with history of acute symptomatic L2 compression fracture; seen recently in consultation and deemed an appropriate candidate for L2 kyphoplasty. Prior history of acute metabolic encephalopathy, on Haldol and Seroquel currently- appears more calm today. Prior history of hematochezia with flexible sigmoidoscopy on 10/12 revealing stercoral ulcers/nonbleeding internal hemorrhoids; patient afebrile,WBC 10, hemoglobin 11.5, platelets 307k, creatinine 1.01. Details of L2 kyphoplasty again discussed with patient and wife. They are in agreement to proceed with case as soon as possible. Consent signed. Insurance authorization still pending but hopeful that procedure can be done tomorrow.   Electronically Signed: D. Rowe Robert, PA-C 09/13/2017, 1:20 PM   I spent a total of 25 minutes at the the patient's bedside AND on the patient's hospital floor or unit, greater than 50% of which was counseling/coordinating care for L2 kyphoplasty    Patient ID: KRISTI HYER, male   DOB: [birth year], 76 y.o.   MRN: [id number]

## 2017-09-13 NOTE — Progress Notes (Signed)
Patient ID: Ryan Duke, male   DOB: 1941-09-09, 76 y.o.   MRN: [id number]  PROGRESS NOTE    Ryan Duke  HYW:[id number] DOB: 1940/12/23 DOA: 09/05/2017  PCP: Janith Lima, MD   Brief Narrative:  76 year old male with DM, hypertension, L2 compression fracture, no surgical intervention was required at that time back in 07/2017. His pain has however persisted. He has not underwent kyphoplasty yet due to altered mental status, agitation. He did have flex sig 10/12 for lower GI bleed but no acute issues identified other than stercoral ulcers and non-bleeding internal hemorrhoids.   Assessment & Plan:   Active Problems:   Back pain / Closed compression fracture of L2 lumbar vertebra (HCC) - Appreciate IR very much seeing the pt and hope that pt continues to be more alert so he can have kypho tomorrow - Continue pain management efforts - SW help with placement to SNF    Acute metabolic encephalopathy - Continue Haldol and Seroquel     Hematochezia / Acute blood loss anemia  - Had flex sigmoidoscopy 10/12 - stercoral ulcers, non- bleeding internal hemorrhoids - No bleeding     DM with diabetic nephropathy - Continue current insulin regimen     CKD stage 3  - Cr WNL    Hypokalemia - Supplemented - Follow up BMP in am    Constipation - Canasa for 5 days (started 10/13) - No naproxen or ibuprofen or other NSAID's - Has had frequent stools so stop all laxatives, only use senokot as needed    DVT prophylaxis: SCD's Code Status: full code  Family Communication: met his wife today, very pleasant lady, hopeful he will get better and be able to go to SNF Disposition Plan: plan for kypho tomorrow   Consultants:   GI, Dr. Silverio Decamp  IR for kyphoplasty   PT  Procedures:   Flex sig 10/12 - few ulcers in the distal rectum consistent with stercoral ulcers. Non-bleeding internal hemorrhoids  Antimicrobials:   None      Subjective: More alert this  am.  Objective: Vitals:   09/12/17 0344 09/12/17 1400 09/12/17 2057 09/13/17 0442  BP: (!) 160/81 (!) 160/80 (!) 151/78 (!) 157/82  Pulse: 70 77 87 82  Resp: 20 20 18 18   Temp: 98.3 F (36.8 C) 98.2 F (36.8 C) 98.6 F (37 C) 98 F (36.7 C)  TempSrc: Oral Oral Oral Oral  SpO2: 92% 95% 98% 96%  Weight:      Height:        Intake/Output Summary (Last 24 hours) at 09/13/17 1155 Last data filed at 09/13/17 0942  Gross per 24 hour  Intake              390 ml  Output              500 ml  Net             -110 ml   Filed Weights   09/05/17 2044 09/10/17 0403 09/10/17 1248  Weight: 100.7 kg (222 lb 0.1 oz) 100 kg (220 lb 7.4 oz) 100.7 kg (222 lb)    Physical Exam  Constitutional: Appears well-developed and well-nourished. No distress.  CVS: Rate controlled, S1/S2 + Pulmonary: Effort and breath sounds normal, no rhonchi Abdominal: Soft. BS +,  no distension, tenderness, rebound or guarding.  Musculoskeletal: Normal range of motion. No edema and no tenderness.  Lymphadenopathy: No lymphadenopathy noted, cervical, inguinal. Neuro: Alert. No cranial nerve deficit. Skin: Skin is  warm and dry.  Psychiatric: Normal mood and affect.    Data Reviewed: I have personally reviewed following labs and imaging studies  CBC:  Recent Labs Lab 09/09/17 0329 09/10/17 0325 09/11/17 0339 09/12/17 0456 09/13/17 0512  WBC 12.5* 9.6 9.9 8.8 10.0  HGB 13.8 11.2* 10.7* 10.5* 11.5*  HCT 41.1 33.5* 32.0* 31.1* 33.6*  MCV 93.4 93.1 93.0 92.8 92.3  PLT 341 292 300 309 262   Basic Metabolic Panel:  Recent Labs Lab 09/08/17 0432  09/09/17 0329 09/10/17 0325 09/11/17 0339 09/12/17 0456 09/13/17 0512  NA 140  < > 138 138 138 140 137  K 2.7*  < > 3.6 3.9 3.5 3.1* 3.4*  CL 101  < > 103 107 105 106 101  CO2 28  < > 24 22 23 27 26   GLUCOSE 101*  < > 152* 106* 108* 113* 115*  BUN 19  < > 30* 38* 27* 21* 16  CREATININE 1.18  < > 1.52* 1.50* 1.32* 1.07 1.01  CALCIUM 9.1  < > 9.0 8.4* 8.5*  8.8* 8.8*  MG 2.1  --   --   --   --   --   --   < > = values in this interval not displayed. GFR: Estimated Creatinine Clearance: 72.9 mL/min (by C-G formula based on SCr of 1.01 mg/dL). Liver Function Tests:  Recent Labs Lab 09/08/17 0432 09/09/17 0329  AST 25 30  ALT 21 27  ALKPHOS 116 116  BILITOT 0.8 0.7  PROT 6.8 6.7  ALBUMIN 3.6 3.7   No results for input(s): LIPASE, AMYLASE in the last 168 hours. No results for input(s): AMMONIA in the last 168 hours. Coagulation Profile: No results for input(s): INR, PROTIME in the last 168 hours. Cardiac Enzymes:  Recent Labs Lab 09/07/17 1137  TROPONINI 0.04*   BNP (last 3 results) No results for input(s): PROBNP in the last 8760 hours. HbA1C: No results for input(s): HGBA1C in the last 72 hours. CBG:  Recent Labs Lab 09/12/17 0744 09/12/17 1117 09/12/17 1624 09/12/17 2101 09/13/17 0733  GLUCAP 96 113* 76 127* 82   Lipid Profile: No results for input(s): CHOL, HDL, LDLCALC, TRIG, CHOLHDL, LDLDIRECT in the last 72 hours. Thyroid Function Tests: No results for input(s): TSH, T4TOTAL, FREET4, T3FREE, THYROIDAB in the last 72 hours. Anemia Panel: No results for input(s): VITAMINB12, FOLATE, FERRITIN, TIBC, IRON, RETICCTPCT in the last 72 hours. Urine analysis:    Component Value Date/Time   COLORURINE YELLOW 09/05/2017 1702   APPEARANCEUR HAZY (A) 09/05/2017 1702   LABSPEC 1.030 09/05/2017 1702   PHURINE 5.0 09/05/2017 1702   GLUCOSEU NEGATIVE 09/05/2017 1702   GLUCOSEU NEGATIVE 03/15/2017 1002   HGBUR NEGATIVE 09/05/2017 1702   BILIRUBINUR NEGATIVE 09/05/2017 1702   KETONESUR NEGATIVE 09/05/2017 1702   PROTEINUR 100 (A) 09/05/2017 1702   UROBILINOGEN 0.2 03/15/2017 1002   NITRITE NEGATIVE 09/05/2017 1702   LEUKOCYTESUR NEGATIVE 09/05/2017 1702   Sepsis Labs: @LABRCNTIP (procalcitonin:4,lacticidven:4)   ) Recent Results (from the past 240 hour(s))  MRSA PCR Screening     Status: None   Collection Time:  09/08/17  5:23 PM  Result Value Ref Range Status   MRSA by PCR NEGATIVE NEGATIVE Final    Comment:        The GeneXpert MRSA Assay (FDA approved for NASAL specimens only), is one component of a comprehensive MRSA colonization surveillance program. It is not intended to diagnose MRSA infection nor to guide or monitor treatment for MRSA infections.  Radiology Studies: No results found.      Scheduled Meds: . atenolol  50 mg Oral Daily  . diclofenac sodium  2 g Topical QID  . docusate sodium  100 mg Oral BID  . fentaNYL  50 mcg Transdermal Q72H  . insulin aspart  0-9 Units Subcutaneous TID WC  . insulin glargine  15 Units Subcutaneous QHS  . mesalamine  1,000 mg Rectal QHS  . methocarbamol  500 mg Oral TID  . polycarbophil  625 mg Oral TID WC  . polyethylene glycol  17 g Oral TID WC  . pravastatin  40 mg Oral Daily  . QUEtiapine  25 mg Oral Daily  . QUEtiapine  50 mg Oral q1800  . tamsulosin  0.4 mg Oral Daily   Continuous Infusions: . sodium chloride 10 mL/hr at 09/12/17 1144     LOS: 8 days    Time spent: 25 minutes  Greater than 50% of the time spent on counseling and coordinating the care.   Leisa Lenz, MD [facility] Pager [phone]  If 7PM-7AM, please contact night-coverage [email or web address] Password [email or web address] 09/13/2017, 11:55 AM

## 2017-09-13 NOTE — Progress Notes (Signed)
PT Cancellation Note  Patient Details Name: Ryan Duke MRN: [id number] DOB: [birth year]   Cancelled Treatment:    Reason Eval/Treat Not Completed: Medical issues which prohibited therapy Pt's wife states Dr. Charlies Silvers told her that pt  Is to remain in bed due to closed compression fracture of L2 lumbar vertebra with plans for KP tomorrow by IR.  Spouse aware that physical therapy will then check back after kyphoplasty to assist with mobility.  Cherrise Occhipinti,KATHrine E 09/13/2017, 1:01 PM Carmelia Bake, PT, DPT 09/13/2017 Pager: [phone]

## 2017-09-14 ENCOUNTER — Ambulatory Visit: Payer: Medicare Other | Admitting: Internal Medicine

## 2017-09-14 ENCOUNTER — Inpatient Hospital Stay (HOSPITAL_COMMUNITY): Payer: Medicare Other

## 2017-09-14 ENCOUNTER — Encounter (HOSPITAL_COMMUNITY): Payer: Self-pay | Admitting: Interventional Radiology

## 2017-09-14 ENCOUNTER — Other Ambulatory Visit: Payer: Self-pay | Admitting: Student

## 2017-09-14 HISTORY — PX: IR KYPHO LUMBAR INC FX REDUCE BONE BX UNI/BIL CANNULATION INC/IMAGING: IMG5519

## 2017-09-14 LAB — BASIC METABOLIC PANEL
Anion gap: 10 (ref 5–15)
BUN: 18 mg/dL (ref 6–20)
CHLORIDE: 106 mmol/L (ref 101–111)
CO2: 25 mmol/L (ref 22–32)
CREATININE: 1.09 mg/dL (ref 0.61–1.24)
Calcium: 9 mg/dL (ref 8.9–10.3)
Glucose, Bld: 120 mg/dL — ABNORMAL HIGH (ref 65–99)
POTASSIUM: 3.8 mmol/L (ref 3.5–5.1)
SODIUM: 141 mmol/L (ref 135–145)

## 2017-09-14 LAB — CBC
HCT: 35 % — ABNORMAL LOW (ref 39.0–52.0)
Hemoglobin: 11.8 g/dL — ABNORMAL LOW (ref 13.0–17.0)
MCH: 31.1 pg (ref 26.0–34.0)
MCHC: 33.7 g/dL (ref 30.0–36.0)
MCV: 92.3 fL (ref 78.0–100.0)
PLATELETS: 333 10*3/uL (ref 150–400)
RBC: 3.79 MIL/uL — AB (ref 4.22–5.81)
RDW: 12.4 % (ref 11.5–15.5)
WBC: 10.5 10*3/uL (ref 4.0–10.5)

## 2017-09-14 LAB — GLUCOSE, CAPILLARY
Glucose-Capillary: 108 mg/dL — ABNORMAL HIGH (ref 65–99)
Glucose-Capillary: 118 mg/dL — ABNORMAL HIGH (ref 65–99)
Glucose-Capillary: 77 mg/dL (ref 65–99)
Glucose-Capillary: 150 mg/dL — ABNORMAL HIGH (ref 65–99)

## 2017-09-14 LAB — PROTIME-INR
INR: 1.04
Prothrombin Time: 13.5 seconds (ref 11.4–15.2)

## 2017-09-14 MED ORDER — LIDOCAINE HCL (PF) 1 % IJ SOLN
INTRAMUSCULAR | Status: AC
  Filled 2017-09-14: qty 30

## 2017-09-14 MED ORDER — FENTANYL CITRATE (PF) 100 MCG/2ML IJ SOLN
INTRAMUSCULAR | Status: AC
  Filled 2017-09-14: qty 4

## 2017-09-14 MED ORDER — FENTANYL CITRATE (PF) 100 MCG/2ML IJ SOLN
INTRAMUSCULAR | Status: AC | PRN
  Administered 2017-09-14: 25 ug via INTRAVENOUS
  Administered 2017-09-14: 50 ug via INTRAVENOUS
  Administered 2017-09-14 (×2): 25 ug via INTRAVENOUS

## 2017-09-14 MED ORDER — IOPAMIDOL (ISOVUE-300) INJECTION 61%: 50.0000 mL | Freq: Once | INTRAVENOUS | Status: DC | PRN

## 2017-09-14 MED ORDER — HALOPERIDOL LACTATE 5 MG/ML IJ SOLN
5.0000 mg | Freq: Once | INTRAMUSCULAR | Status: AC
  Administered 2017-09-14: 5 mg via INTRAVENOUS
  Filled 2017-09-14: qty 1

## 2017-09-14 MED ORDER — MIDAZOLAM HCL 2 MG/2ML IJ SOLN
INTRAMUSCULAR | Status: AC
  Filled 2017-09-14: qty 4

## 2017-09-14 MED ORDER — IOPAMIDOL (ISOVUE-300) INJECTION 61%
INTRAVENOUS | Status: AC
  Filled 2017-09-14: qty 50

## 2017-09-14 MED ORDER — NALOXONE HCL 0.4 MG/ML IJ SOLN
INTRAMUSCULAR | Status: AC
  Filled 2017-09-14: qty 1

## 2017-09-14 MED ORDER — CEFAZOLIN SODIUM-DEXTROSE 2-4 GM/100ML-% IV SOLN
INTRAVENOUS | Status: AC
  Filled 2017-09-14: qty 100

## 2017-09-14 MED ORDER — MIDAZOLAM HCL 2 MG/2ML IJ SOLN
INTRAMUSCULAR | Status: AC | PRN
  Administered 2017-09-14 (×3): 0.5 mg via INTRAVENOUS
  Administered 2017-09-14: 1 mg via INTRAVENOUS

## 2017-09-14 MED ORDER — FLUMAZENIL 0.5 MG/5ML IV SOLN
INTRAVENOUS | Status: AC
  Filled 2017-09-14: qty 5

## 2017-09-14 NOTE — Sedation Documentation (Signed)
Patient denies pain and is resting comfortably.  

## 2017-09-14 NOTE — Procedures (Signed)
Interventional Radiology Procedure Note  Procedure: Satisfactory L2 KP.   Complications: None  Estimated Blood Loss: < 25 mL  Recommendations: - Bedrest x 3 hrs   Signed,  Criselda Peaches, MD

## 2017-09-14 NOTE — Progress Notes (Signed)
Patient ID: Ryan Duke, male   DOB: Nov 21, 1941, 76 y.o.   MRN: [id number]  PROGRESS NOTE    Deane Wattenbarger Song  QMG:[id number] DOB: 02-13-1941 DOA: 09/05/2017  PCP: Janith Lima, MD   Brief Narrative:  76 year old male with DM, hypertension, L2 compression fracture, no surgical intervention was required at that time back in 07/2017. His pain has however persisted. He has not underwent kyphoplasty yet due to altered mental status, agitation. He did have flex sig 10/12 for lower GI bleed but no acute issues identified other than stercoral ulcers and non-bleeding internal hemorrhoids.   Assessment & Plan:   Active Problems:   Back pain / Closed compression fracture of L2 lumbar vertebra (HCC) - Plan for kyphoplasty this am - Continue pain management efforts  - Plan for SNF placement     Acute metabolic encephalopathy - Continue Seroquel     Hematochezia / Acute blood loss anemia  - Had flex sigmoidoscopy 10/12 - stercoral ulcers, non- bleeding internal hemorrhoids - No reports of bleeding     DM with diabetic nephropathy - Continue Lantus 15 units at bedtime and SSI - CBG's in past 24 hours: 85, 104, 108    CKD stage 3  - Cr WNL    Hypokalemia - Supplemented and WNL    Essential hypertension - Continue atenolol     Constipation - Canasa for 5 days (started 10/13) - No naproxen or ibuprofen or other NSAID's - Has had BM in past 24 hours    DVT prophylaxis: SCD's Code Status: full code  Family Communication: no family at the bedside  Disposition Plan: kyphoplasty in am   Consultants:   GI, Dr. Silverio Decamp  IR for kyphoplasty   PT  Procedures:   Flex sig 10/12 - few ulcers in the distal rectum consistent with stercoral ulcers. Non-bleeding internal hemorrhoids  Antimicrobials:   None     Subjective: No overnight events.  Objective: Vitals:   09/13/17 0442 09/13/17 1440 09/13/17 2053 09/14/17 0645  BP: (!) 157/82 (!) 155/75 (!) 159/75 (!) 167/120    Pulse: 82 85 84 (!) 103  Resp: 18 18 17  (!) 22  Temp: 98 F (36.7 C) 98.5 F (36.9 C) 99 F (37.2 C) 98 F (36.7 C)  TempSrc: Oral Oral Oral   SpO2: 96% 98% 92% 92%  Weight:      Height:        Intake/Output Summary (Last 24 hours) at 09/14/17 1053 Last data filed at 09/14/17 0200  Gross per 24 hour  Intake           749.33 ml  Output              150 ml  Net           599.33 ml   Filed Weights   09/05/17 2044 09/10/17 0403 09/10/17 1248  Weight: 100.7 kg (222 lb 0.1 oz) 100 kg (220 lb 7.4 oz) 100.7 kg (222 lb)    Physical Exam  Constitutional: Appears well-developed and well-nourished. No distress.  CVS: Rate controlled, S1/S2 + Pulmonary: Effort and breath sounds normal, no stridor, rhonchi, wheezes, rales.  Abdominal: Soft. BS +,  no distension, tenderness, rebound or guarding.  Musculoskeletal: Normal range of motion. No edema and no tenderness.  Lymphadenopathy: No lymphadenopathy noted, cervical, inguinal. Neuro: Alert. No focal deficits  Skin: Skin is warm and dry.  Psychiatric: Normal mood and affect.    Data Reviewed: I have personally reviewed following labs  and imaging studies  CBC:  Recent Labs Lab 09/10/17 0325 09/11/17 0339 09/12/17 0456 09/13/17 0512 09/14/17 0453  WBC 9.6 9.9 8.8 10.0 10.5  HGB 11.2* 10.7* 10.5* 11.5* 11.8*  HCT 33.5* 32.0* 31.1* 33.6* 35.0*  MCV 93.1 93.0 92.8 92.3 92.3  PLT 292 300 309 307 161   Basic Metabolic Panel:  Recent Labs Lab 09/08/17 0432  09/10/17 0325 09/11/17 0339 09/12/17 0456 09/13/17 0512 09/14/17 0453  NA 140  < > 138 138 140 137 141  K 2.7*  < > 3.9 3.5 3.1* 3.4* 3.8  CL 101  < > 107 105 106 101 106  CO2 28  < > 22 23 27 26 25   GLUCOSE 101*  < > 106* 108* 113* 115* 120*  BUN 19  < > 38* 27* 21* 16 18  CREATININE 1.18  < > 1.50* 1.32* 1.07 1.01 1.09  CALCIUM 9.1  < > 8.4* 8.5* 8.8* 8.8* 9.0  MG 2.1  --   --   --   --   --   --   < > = values in this interval not displayed. GFR: Estimated  Creatinine Clearance: 67.5 mL/min (by C-G formula based on SCr of 1.09 mg/dL). Liver Function Tests:  Recent Labs Lab 09/08/17 0432 09/09/17 0329  AST 25 30  ALT 21 27  ALKPHOS 116 116  BILITOT 0.8 0.7  PROT 6.8 6.7  ALBUMIN 3.6 3.7   No results for input(s): LIPASE, AMYLASE in the last 168 hours. No results for input(s): AMMONIA in the last 168 hours. Coagulation Profile:  Recent Labs Lab 09/14/17 0453  INR 1.04   Cardiac Enzymes:  Recent Labs Lab 09/07/17 1137  TROPONINI 0.04*   BNP (last 3 results) No results for input(s): PROBNP in the last 8760 hours. HbA1C: No results for input(s): HGBA1C in the last 72 hours. CBG:  Recent Labs Lab 09/13/17 0733 09/13/17 1159 09/13/17 1659 09/13/17 2049 09/14/17 0756  GLUCAP 82 143* 85 104* 108*   Lipid Profile: No results for input(s): CHOL, HDL, LDLCALC, TRIG, CHOLHDL, LDLDIRECT in the last 72 hours. Thyroid Function Tests: No results for input(s): TSH, T4TOTAL, FREET4, T3FREE, THYROIDAB in the last 72 hours. Anemia Panel: No results for input(s): VITAMINB12, FOLATE, FERRITIN, TIBC, IRON, RETICCTPCT in the last 72 hours. Urine analysis:    Component Value Date/Time   COLORURINE YELLOW 09/05/2017 1702   APPEARANCEUR HAZY (A) 09/05/2017 1702   LABSPEC 1.030 09/05/2017 1702   PHURINE 5.0 09/05/2017 1702   GLUCOSEU NEGATIVE 09/05/2017 1702   GLUCOSEU NEGATIVE 03/15/2017 1002   HGBUR NEGATIVE 09/05/2017 1702   BILIRUBINUR NEGATIVE 09/05/2017 1702   KETONESUR NEGATIVE 09/05/2017 1702   PROTEINUR 100 (A) 09/05/2017 1702   UROBILINOGEN 0.2 03/15/2017 1002   NITRITE NEGATIVE 09/05/2017 1702   LEUKOCYTESUR NEGATIVE 09/05/2017 1702   Sepsis Labs: @LABRCNTIP (procalcitonin:4,lacticidven:4)   ) Recent Results (from the past 240 hour(s))  MRSA PCR Screening     Status: None   Collection Time: 09/08/17  5:23 PM  Result Value Ref Range Status   MRSA by PCR NEGATIVE NEGATIVE Final    Comment:        The GeneXpert  MRSA Assay (FDA approved for NASAL specimens only), is one component of a comprehensive MRSA colonization surveillance program. It is not intended to diagnose MRSA infection nor to guide or monitor treatment for MRSA infections.       Radiology Studies: No results found.      Scheduled Meds: . atenolol  50 mg Oral Daily  . diclofenac sodium  2 g Topical QID  . fentaNYL  50 mcg Transdermal Q72H  . insulin aspart  0-9 Units Subcutaneous TID WC  . insulin glargine  15 Units Subcutaneous QHS  . mesalamine  1,000 mg Rectal QHS  . methocarbamol  500 mg Oral TID  . pravastatin  40 mg Oral Daily  . QUEtiapine  25 mg Oral Daily  . QUEtiapine  50 mg Oral q1800  . tamsulosin  0.4 mg Oral Daily   Continuous Infusions: . sodium chloride 10 mL/hr at 09/12/17 1144  .  ceFAZolin (ANCEF) IV       LOS: 9 days    Time spent: 25 minutes  Greater than 50% of the time spent on counseling and coordinating the care.   Leisa Lenz, MD [facility] Pager [phone]  If 7PM-7AM, please contact night-coverage [email or web address] Password [email or web address] 09/14/2017, 10:53 AM

## 2017-09-14 NOTE — Progress Notes (Signed)
Patient back from IR, resting comfortably. Must remain flat for 3 hours. Will continue to monitor.

## 2017-09-14 NOTE — Progress Notes (Addendum)
CSW consult: SNF Placement  CSW acknowledged consult for SNF placement.   Per chart review, PT/OT are unable to see patient due to medical issues prohibiting therapy. Last PT note recommended Home Health PT; Supervision/Assistance - 24 hour, patient will need a new PT note with recommended level of care. Patient has insurance that requires prior authorization. CSW will continue to follow and assist with discharge planning.   Abundio Miu, [place] Social Worker [facility] Cell#: [phone]

## 2017-09-14 NOTE — Care Management Important Message (Signed)
Important Message  Patient Details  Name: Ryan Duke MRN: [id number] Date of Birth: December 13, 1940   Medicare Important Message Given:  Yes    Kerin Salen 09/14/2017, 11:12 AMImportant Message  Patient Details  Name: Ryan Duke MRN: [id number] Date of Birth: 1941-03-14   Medicare Important Message Given:  Yes    Kerin Salen 09/14/2017, 11:12 AM

## 2017-09-15 ENCOUNTER — Inpatient Hospital Stay (HOSPITAL_COMMUNITY): Payer: Medicare Other

## 2017-09-15 LAB — GLUCOSE, CAPILLARY
GLUCOSE-CAPILLARY: 173 mg/dL — AB (ref 65–99)
GLUCOSE-CAPILLARY: 116 mg/dL — AB (ref 65–99)
Glucose-Capillary: 110 mg/dL — ABNORMAL HIGH (ref 65–99)
Glucose-Capillary: 138 mg/dL — ABNORMAL HIGH (ref 65–99)

## 2017-09-15 MED ORDER — ENSURE ENLIVE PO LIQD
237.0000 mL | Freq: Two times a day (BID) | ORAL | Status: DC
  Administered 2017-09-15 – 2017-09-16 (×3): 237 mL via ORAL

## 2017-09-15 MED ORDER — IPRATROPIUM-ALBUTEROL 0.5-2.5 (3) MG/3ML IN SOLN: 3.0000 mL | RESPIRATORY_TRACT | Status: DC | PRN

## 2017-09-15 NOTE — Evaluation (Signed)
Physical Therapy  RE-Evaluation Patient Details Name: Ryan Duke MRN: [id number] DOB: 1941-05-07 Today's Date: 09/15/2017   History of Present Illness  pt is a 76 y/o male with pmh significant for DM2, HTN, recent fall with resultant L2 compression fracture. admitted with worsening low back pain, decreased ability to move, decreased oral intake, weight loss and confusion.  status post L2 kyphoplasty on 10/16  Clinical Impression  The patient required maximum stimulation to arouse from sleep, noted snoring and periods of holding breath. Assited to sitting  At the bed edge with mod assist. Requires 2 max assist to stand and  Take a few steps to recliner. Spoke with wife who agrees with SNF. The patient  Does present with some confusion, per RN, worsens at night. Pt admitted with above diagnosis. Pt currently with functional limitations due to the deficits listed below (see PT Problem List).  Pt will benefit from skilled PT to increase their independence and safety with mobility to allow discharge to the venue listed below.       Follow Up Recommendations SNF    Equipment Recommendations  3in1 (PT);Other (comment)    Recommendations for Other Services       Precautions / Restrictions Precautions Precautions: Fall;Back Precaution Comments: brace per pat. comfort. Required Braces or Orthoses: Spinal Brace Spinal Brace: Thoracolumbosacral orthotic;Applied in sitting position Restrictions Weight Bearing Restrictions: No      Mobility  Bed Mobility Overal bed mobility: Needs Assistance Bed Mobility: Supine to Sit;Sidelying to Sit Rolling: Mod assist Sidelying to sit: HOB elevated;Mod assist       General bed mobility comments: asist with legs and trunk, encouraged to roll, use bed rail, bed pad to scoot to bed edge  Transfers Overall transfer level: Needs assistance Equipment used: Rolling walker (2 wheeled) Transfers: Sit to/from [employer] Transfers Sit to Stand:  +2 physical assistance;+2 safety/equipment;Max assist;From elevated surface Stand pivot transfers: Max assist;+2 physical assistance;+2 safety/equipment       General transfer comment: attempted x 3 to stand, required 2 assist to power up, knees slightly flexed. small steps to pivot to recliner using RW  Ambulation/Gait                Stairs            Wheelchair Mobility    Modified Rankin (Stroke Patients Only)       Balance                                             Pertinent Vitals/Pain Pain Score: 0-No pain    Home Living                        Prior Function                 Hand Dominance        Extremity/Trunk Assessment                Communication      Cognition Arousal/Alertness: Awake/alert   Overall Cognitive Status: Impaired/Different from baseline Area of Impairment: Attention;Following commands;Safety/judgement;Problem solving                   Current Attention Level: Sustained   Following Commands: Follows one step commands consistently     Problem Solving: Slow processing;Decreased initiation General Comments: noted snoring and  apneic periods.      General Comments      Exercises     Assessment/Plan    PT Assessment    PT Problem List         PT Treatment Interventions      PT Goals (Current goals can be found in the Care Plan section)  Acute Rehab PT Goals Patient Stated Goal: needs rehab PT Goal Formulation: With patient/family Time For Goal Achievement: 09/29/17 Potential to Achieve Goals: Good    Frequency Min 3X/week   Barriers to discharge        Co-evaluation               AM-PAC PT "6 Clicks" Daily Activity  Outcome Measure Difficulty turning over in bed (including adjusting bedclothes, sheets and blankets)?: Unable Difficulty moving from lying on back to sitting on the side of the bed? : Unable Difficulty sitting down on and standing up  from a chair with arms (e.g., wheelchair, bedside commode, etc,.)?: Unable Help needed moving to and from a bed to chair (including a wheelchair)?: Total Help needed walking in hospital room?: Total Help needed climbing 3-5 steps with a railing? : Total 6 Click Score: 6    End of Session Equipment Utilized During Treatment: Gait belt Activity Tolerance: Patient tolerated treatment well Patient left: in chair;with call bell/phone within reach;with family/visitor present Nurse Communication: Mobility status PT Visit Diagnosis: Unsteadiness on feet (R26.81)    Time: 7619-5093 PT Time Calculation (min) (ACUTE ONLY): 39 min   Charges:   PT Evaluation $PT Re-evaluation: 1 Re-eval PT Treatments $Therapeutic Activity: 23-37 mins   PT G CodesTresa Endo PT [phone]   Claretha Cooper 09/15/2017, 12:02 PM

## 2017-09-15 NOTE — Progress Notes (Signed)
Referring Physician(s): Devine,A  Supervising Physician: Marybelle Killings  Patient Status:  [facility] - In-pt  Chief Complaint:  Back pain, L2 compression fracture  Subjective: Patient doing fairly well today. Denies worsening back pain; able to move all fours okay. Still has some shortness of breath.   Allergies: Farxiga [dapagliflozin]; Metformin and related; Atorvastatin; and Benazepril hcl  Medications: Prior to Admission medications   Medication Sig Start Date End Date Taking? Authorizing Provider  atenolol-chlorthalidone (TENORETIC) 100-25 MG tablet Take 1 tablet by mouth daily. 09/03/17  Yes Janith Lima, MD  cyclobenzaprine (FLEXERIL) 10 MG tablet Take 10 mg by mouth 3 (three) times daily.   Yes [provider]  HYDROcodone-acetaminophen (NORCO/VICODIN) 5-325 MG tablet Take 1-2 tablets by mouth every 6 (six) hours as needed. Patient taking differently: Take 1-2 tablets by mouth every 6 (six) hours as needed for moderate pain.  08/21/17  Yes Ward, Delice Bison, DO  Insulin Glargine (TOUJEO SOLOSTAR) 300 UNIT/ML SOPN Inject 30 Units into the skin daily. Patient taking differently: Inject 30 Units into the skin at bedtime.  06/18/17  Yes Janith Lima, MD  JANUVIA 100 MG tablet TAKE 1 TABLET ONCE DAILY. 08/28/17  Yes Janith Lima, MD  losartan (COZAAR) 100 MG tablet Take 1 tablet (100 mg total) by mouth daily. 09/03/17  Yes Janith Lima, MD  ondansetron (ZOFRAN ODT) 4 MG disintegrating tablet Take 1 tablet (4 mg total) by mouth every 8 (eight) hours as needed for nausea or vomiting. 08/21/17  Yes Ward, Delice Bison, DO  oxyCODONE-acetaminophen (PERCOCET/ROXICET) 5-325 MG tablet Take 0.5-1 tablets by mouth 4 (four) times daily as needed for moderate pain.  09/02/17  Yes [provider]  pravastatin (PRAVACHOL) 40 MG tablet TAKE 1 TABLET ONCE DAILY. 12/07/16  Yes Janith Lima, MD  fentaNYL (DURAGESIC - DOSED MCG/HR) 50 MCG/HR Place 1 patch (50 mcg total) onto the skin  every 3 (three) days. 09/05/17   Dorie Rank, MD  Lancets MISC Test blood sugar twice daily as directed. Dx:250.02 10/04/12   Janith Lima, MD     Vital Signs: BP (!) 151/70 (BP Location: Left Arm)   Pulse 85   Temp 98.7 F (37.1 C) (Oral)   Resp (!) 22   Ht 5\' 11"  (1.803 m)   Wt 222 lb (100.7 kg)   SpO2 96%   BMI 30.96 kg/m   Physical Exam awake, alert. Puncture site left L2 region clean and dry, nontender, dressing intact; patient moving all fours okay; denies paresthesias.  Imaging: Dg Chest Port 1 View  Result Date: 09/15/2017 CLINICAL DATA:  Exertional shortness of breath. History of aortic valve disorder, chronic renal insufficiency, former smoker. EXAM: PORTABLE CHEST 1 VIEW COMPARISON:  Chest x-ray of August 20, 2017 FINDINGS: The lungs are well-expanded and clear. The heart and pulmonary vascularity are normal. The mediastinum is normal in width. There is mild tortuosity of the ascending and descending thoracic aorta. The bony thorax exhibits no acute abnormality. IMPRESSION: There is no acute cardiopulmonary abnormality. Electronically Signed   By: David  Martinique M.D.   On: 09/15/2017 10:31   Ir Kypho Lumbar Inc Fx Reduce Bone Bx Uni/bil Cannulation Inc/imaging  Result Date: 09/14/2017 CLINICAL DATA:  76 year old male with acute L2 compression fracture which has not been responsive to conservative management. EXAM: FLUOROSCOPIC GUIDED KYPHOPLASTY OF THE L2 VERTEBRAL BODY COMPARISON:  MRI lumbar spine 09/06/2017 MEDICATIONS: As antibiotic prophylaxis, was ordered pre-procedure and administered intravenously within 1 hour of incision. ANESTHESIA/SEDATION:  Moderate (conscious) sedation was employed during this procedure. A total of Versed 2.5 mg and Fentanyl 125 mcg was administered intravenously. Moderate Sedation Time: 30 minutes. The patient's level of consciousness and vital signs were monitored continuously by radiology nursing throughout the procedure under my direct  supervision. FLUOROSCOPY TIME:  7 minutes, 42 seconds (267 mGy) COMPLICATIONS: None immediate. TECHNIQUE: The procedure, risks (including but not limited to bleeding, infection, organ damage), benefits, and alternatives were explained to the patient. Questions regarding the procedure were encouraged and answered. The patient understands and consents to the procedure. The patient was placed prone on the fluoroscopic table. The skin overlying the upper thoracic region was then prepped and draped in the usual sterile fashion. Maximal barrier sterile technique was utilized including caps, mask, sterile gowns, sterile gloves, sterile drape, hand hygiene and skin antiseptic. Intravenous Fentanyl and Versed were administered as conscious sedation during continuous cardiorespiratory monitoring by the radiology RN. The left pedicle at L2 was then infiltrated with 1% lidocaine followed by the advancement of a Kyphon trocar needle through the left pedicle into the posterior one-third of the vertebral body. Subsequently, the osteo drill was advanced to the anterior third of the vertebral body. The osteo drill was retracted. Through the working cannula, a Kyphon inflatable bone tamp 15 x 3 was advanced and positioned with the distal marker approximately 5 mm from the anterior aspect of the cortex. Appropriate positioning was confirmed on the AP projection. At this time, the balloon was expanded using contrast via a Kyphon inflation syringe device via micro tubing. In similar fashion, the right L2 pedicle was infiltrated with 1% lidocaine followed by the advancement of a second Kyphon trocar needle through the right pedicle into the posterior third of the vertebral body. Subsequently, the osteo drill was coaxially advanced to the anterior right third. The osteo drill was exchanged for a Kyphon inflatable bone tamp 15 x 3, advanced to the 5 mm of the anterior aspect of the cortex. The balloon was then expanded using contrast as  above. Inflations were continued until there was near apposition with the superior end plate. At this time, methylmethacrylate mixture was reconstituted in the Kyphon bone mixing device system. This was then loaded into the delivery mechanism, attached to Kyphon bone fillers. The balloons were deflated and removed followed by the instillation of methylmethacrylate mixture with excellent filling in the AP and lateral projections. No extravasation was noted in the disk spaces or posteriorly into the spinal canal. No epidural venous contamination was seen. The working cannulae and the bone filler were then retrieved and removed. Hemostasis was achieved with manual compression. The patient tolerated the procedure well without immediate postprocedural complication. IMPRESSION: 1. Technically successful L2 vertebral body augmentation using balloon kyphoplasty. 2. Per CMS PQRS reporting requirements (PQRS Measure 24): Given the patient's age of greater than 75 and the fracture site (hip, distal radius, or spine), the patient should be tested for osteoporosis using DXA, and the appropriate treatment considered based on the DXA results. Signed, Criselda Peaches, MD [facility] Radiology Electronically Signed   By: Jacqulynn Cadet M.D.   On: 09/14/2017 15:20    Labs:  CBC:  Recent Labs  09/11/17 0339 09/12/17 0456 09/13/17 0512 09/14/17 0453  WBC 9.9 8.8 10.0 10.5  HGB 10.7* 10.5* 11.5* 11.8*  HCT 32.0* 31.1* 33.6* 35.0*  PLT 300 309 307 333    COAGS:  Recent Labs  09/14/17 0453  INR 1.04    BMP:  Recent Labs  09/11/17 0339 09/12/17 0456 09/13/17 0512 09/14/17 0453  NA 138 140 137 141  K 3.5 3.1* 3.4* 3.8  CL 105 106 101 106  CO2 23 27 26 25   GLUCOSE 108* 113* 115* 120*  BUN 27* 21* 16 18  CALCIUM 8.5* 8.8* 8.8* 9.0  CREATININE 1.32* 1.07 1.01 1.09  GFRNONAA 50* >60 >60 >60  GFRAA 58* >60 >60 >60    LIVER FUNCTION  TESTS:  Recent Labs  08/20/17 2255 09/05/17 1124 09/08/17 0432 09/09/17 0329  BILITOT 0.8 0.8 0.8 0.7  AST 26 25 25 30   ALT 24 23 21 27   ALKPHOS 49 118 116 116  PROT 6.4* 7.1 6.8 6.7  ALBUMIN 3.8 3.9 3.6 3.7    Assessment and Plan: Patient with history of symptomatic L2 compression fracture, status post L2 kyphoplasty on 10/16; afebrile, no new labs, chest x-ray today with no acute abnormality; recommend patient work with physical therapy. Other plans as per primary team.   Electronically Signed: D. Rowe Robert, PA-C 09/15/2017, 11:26 AM   I spent a total of 15 minutes at the the patient's bedside AND on the patient's hospital floor or unit, greater than 50% of which was counseling/coordinating care for L2 kyphoplasty    Patient ID: Ryan Duke, male   DOB: [birth year], 76 y.o.   MRN: [id number]

## 2017-09-15 NOTE — Progress Notes (Signed)
Initial Nutrition Assessment  DOCUMENTATION CODES:   Obesity unspecified  INTERVENTION:  - Ensure Enlive po BID, each supplement provides 350 kcal and 20 grams of protein  NUTRITION DIAGNOSIS:   Inadequate oral intake related to poor appetite, other (see comment) (back pain) as evidenced by per patient/family report.  GOAL:   Patient will meet greater than or equal to 90% of their needs  MONITOR:   PO intake, Supplement acceptance, I & O's, Weight trends  REASON FOR ASSESSMENT:   LOS    ASSESSMENT:   Pt with PMH of HLD, HTN, DM, pancreatitis, hypogonadism presents with back pain found L2 compression fracture s/p kyphoplasty   Spoke with pt and pt's wife at bedside.  Pt's diet advanced to regular this AM.  Pt reports a decreased appetite r/t back pain.  Per chart, pt has consumed 0-75% of meals since admission.  Pt's wife reports weight loss since 09/21, initial fall, of ~255 lbs. RD promoted adequate PO intake.  Pt requested Ensure supplementation while admitted to aid in healing and intake percentage.   Nutrition focused physical exam completed. Findings include no fat depletion, no muscle depletion, and mild edema.   Labs reviewed; CBG 77-150 Medications reviewed; sliding scale insulin, Lantus  Diet Order:  Diet regular Room service appropriate? Yes; Fluid consistency: Thin  Skin:  Reviewed, no issues  Last BM:  09/14/17  Height:   Ht Readings from Last 1 Encounters:  09/10/17 5\' 11"  (1.803 m)    Weight:   Wt Readings from Last 1 Encounters:  09/10/17 222 lb (100.7 kg)    Ideal Body Weight:  78.2 kg  BMI:  Body mass index is 30.96 kg/m.  Estimated Nutritional Needs:   Kcal:  2100-2300   Protein:  121-131 grams  Fluid:  >/= 2.1 L/d   EDUCATION NEEDS:   Education needs no appropriate at this time  Parks Ranger, MS, RDN, LDN 09/15/2017 2:09 PM

## 2017-09-15 NOTE — Progress Notes (Signed)
OT Cancellation Note  Patient Details Name: Ryan Duke MRN: [id number] DOB: September 09, 1941   Cancelled Treatment:    Reason Eval/Treat Not Completed: Fatigue/lethargy limiting ability to participate. Pt had just gotten back to bed.  Will check on pt next day. Kari Baars, [place]  Payton Mccallum D 09/15/2017, 1:42 PM

## 2017-09-15 NOTE — Progress Notes (Signed)
Patient ID: Ryan Duke, male   DOB: 1941-09-28, 76 y.o.   MRN: [id number]  PROGRESS NOTE    Ryan Duke  VOJ:[id number] DOB: 11-May-1941 DOA: 09/05/2017  PCP: Janith Lima, MD   Brief Narrative:  76 year old male with DM, hypertension, L2 compression fracture, no surgical intervention was required at that time back in 07/2017. His pain has however persisted. He has not underwent kyphoplasty yet due to altered mental status, agitation. He did have flex sig 10/12 for lower GI bleed but no acute issues identified other than stercoral ulcers and non-bleeding internal hemorrhoids.   Assessment & Plan:   Active Problems:   Back pain / Closed compression fracture of L2 lumbar vertebra (HCC) - S/P kyphoplasty 10/16 - Obtain PT eval - Continue pain management efforts     Acute metabolic encephalopathy - Continue Seroquel     Hematochezia / Acute blood loss anemia  - Had flex sigmoidoscopy 10/12 - stercoral ulcers, non- bleeding internal hemorrhoids - No reports of bleeding - Hgb stable    New dyspnea - More short of breath this am - Obtain CXR - Start nebulizer treatment as needed     DM with diabetic nephropathy - Continue Lantus 15 units at bedtime and SSI - CBG's in past 24 hours: 77, 150, 110    CKD stage 3  - Cr WNL    Hypokalemia - Supplemented     Essential hypertension - Continue atenolol     Constipation - Canasa for 5 days (started 10/13) - No naproxen or ibuprofen or other NSAID's - Has had bowel movement in past 24 hours    DVT prophylaxis: SCD's Code Status: full code  Family Communication: no family at the bedside  Disposition Plan: To SNF in am depending on PT evaluation    Consultants:   GI, Dr. Silverio Decamp  IR for kyphoplasty   PT  Procedures:   Flex sig 10/12 - few ulcers in the distal rectum consistent with stercoral ulcers. Non-bleeding internal hemorrhoids  Kyphoplasty 10/16   Antimicrobials:   None     Subjective: More  short of breath this am.  Objective: Vitals:   09/14/17 1421 09/14/17 2157 09/15/17 0646 09/15/17 0921  BP:  (!) 118/94 (!) 170/93 (!) 151/70  Pulse:  82 87 85  Resp:  20 (!) 22   Temp:  98.3 F (36.8 C) 98.7 F (37.1 C)   TempSrc:  Oral Oral   SpO2: 92% 96% 96%   Weight:      Height:        Intake/Output Summary (Last 24 hours) at 09/15/17 0941 Last data filed at 09/15/17 0850  Gross per 24 hour  Intake              340 ml  Output              525 ml  Net             -185 ml   Filed Weights   09/05/17 2044 09/10/17 0403 09/10/17 1248  Weight: 100.7 kg (222 lb 0.1 oz) 100 kg (220 lb 7.4 oz) 100.7 kg (222 lb)    Physical Exam  Constitutional: Appears well-developed and well-nourished. Little short of breath this am CVS: Rate controlled, S1/S2 + Pulmonary: Slight wheezing, no rhonchi  Abdominal: Soft. BS +,  no distension, tenderness, rebound or guarding.  Musculoskeletal: Normal range of motion. No edema and no tenderness.  Lymphadenopathy: No lymphadenopathy noted, cervical, inguinal. Neuro: Alert. No  cranial nerve deficit. Skin: Skin is warm and dry.  Psychiatric: Normal mood and affect.      Data Reviewed: I have personally reviewed following labs and imaging studies  CBC:  Recent Labs Lab 09/10/17 0325 09/11/17 0339 09/12/17 0456 09/13/17 0512 09/14/17 0453  WBC 9.6 9.9 8.8 10.0 10.5  HGB 11.2* 10.7* 10.5* 11.5* 11.8*  HCT 33.5* 32.0* 31.1* 33.6* 35.0*  MCV 93.1 93.0 92.8 92.3 92.3  PLT 292 300 309 307 086   Basic Metabolic Panel:  Recent Labs Lab 09/10/17 0325 09/11/17 0339 09/12/17 0456 09/13/17 0512 09/14/17 0453  NA 138 138 140 137 141  K 3.9 3.5 3.1* 3.4* 3.8  CL 107 105 106 101 106  CO2 22 23 27 26 25   GLUCOSE 106* 108* 113* 115* 120*  BUN 38* 27* 21* 16 18  CREATININE 1.50* 1.32* 1.07 1.01 1.09  CALCIUM 8.4* 8.5* 8.8* 8.8* 9.0   GFR: Estimated Creatinine Clearance: 67.5 mL/min (by C-G formula based on SCr of 1.09 mg/dL). Liver  Function Tests:  Recent Labs Lab 09/09/17 0329  AST 30  ALT 27  ALKPHOS 116  BILITOT 0.7  PROT 6.7  ALBUMIN 3.7   No results for input(s): LIPASE, AMYLASE in the last 168 hours. No results for input(s): AMMONIA in the last 168 hours. Coagulation Profile:  Recent Labs Lab 09/14/17 0453  INR 1.04   Cardiac Enzymes: No results for input(s): CKTOTAL, CKMB, CKMBINDEX, TROPONINI in the last 168 hours. BNP (last 3 results) No results for input(s): PROBNP in the last 8760 hours. HbA1C: No results for input(s): HGBA1C in the last 72 hours. CBG:  Recent Labs Lab 09/14/17 0756 09/14/17 1203 09/14/17 1909 09/14/17 2155 09/15/17 0811  GLUCAP 108* 118* 77 150* 110*   Lipid Profile: No results for input(s): CHOL, HDL, LDLCALC, TRIG, CHOLHDL, LDLDIRECT in the last 72 hours. Thyroid Function Tests: No results for input(s): TSH, T4TOTAL, FREET4, T3FREE, THYROIDAB in the last 72 hours. Anemia Panel: No results for input(s): VITAMINB12, FOLATE, FERRITIN, TIBC, IRON, RETICCTPCT in the last 72 hours. Urine analysis:    Component Value Date/Time   COLORURINE YELLOW 09/05/2017 1702   APPEARANCEUR HAZY (A) 09/05/2017 1702   LABSPEC 1.030 09/05/2017 1702   PHURINE 5.0 09/05/2017 1702   GLUCOSEU NEGATIVE 09/05/2017 1702   GLUCOSEU NEGATIVE 03/15/2017 1002   HGBUR NEGATIVE 09/05/2017 1702   BILIRUBINUR NEGATIVE 09/05/2017 1702   KETONESUR NEGATIVE 09/05/2017 1702   PROTEINUR 100 (A) 09/05/2017 1702   UROBILINOGEN 0.2 03/15/2017 1002   NITRITE NEGATIVE 09/05/2017 1702   LEUKOCYTESUR NEGATIVE 09/05/2017 1702   Sepsis Labs: @LABRCNTIP (procalcitonin:4,lacticidven:4)   ) Recent Results (from the past 240 hour(s))  MRSA PCR Screening     Status: None   Collection Time: 09/08/17  5:23 PM  Result Value Ref Range Status   MRSA by PCR NEGATIVE NEGATIVE Final    Comment:        The GeneXpert MRSA Assay (FDA approved for NASAL specimens only), is one component of a comprehensive  MRSA colonization surveillance program. It is not intended to diagnose MRSA infection nor to guide or monitor treatment for MRSA infections.       Radiology Studies: Ir Kypho Lumbar Inc Fx Reduce Bone Bx Uni/bil Cannulation Inc/imaging  Result Date: 09/14/2017 CLINICAL DATA:  76 year old male with acute L2 compression fracture which has not been responsive to conservative management. EXAM: FLUOROSCOPIC GUIDED KYPHOPLASTY OF THE L2 VERTEBRAL BODY COMPARISON:  MRI lumbar spine 09/06/2017 MEDICATIONS: As antibiotic prophylaxis, was ordered pre-procedure  and administered intravenously within 1 hour of incision. ANESTHESIA/SEDATION: Moderate (conscious) sedation was employed during this procedure. A total of Versed 2.5 mg and Fentanyl 125 mcg was administered intravenously. Moderate Sedation Time: 30 minutes. The patient's level of consciousness and vital signs were monitored continuously by radiology nursing throughout the procedure under my direct supervision. FLUOROSCOPY TIME:  7 minutes, 42 seconds (161 mGy) COMPLICATIONS: None immediate. TECHNIQUE: The procedure, risks (including but not limited to bleeding, infection, organ damage), benefits, and alternatives were explained to the patient. Questions regarding the procedure were encouraged and answered. The patient understands and consents to the procedure. The patient was placed prone on the fluoroscopic table. The skin overlying the upper thoracic region was then prepped and draped in the usual sterile fashion. Maximal barrier sterile technique was utilized including caps, mask, sterile gowns, sterile gloves, sterile drape, hand hygiene and skin antiseptic. Intravenous Fentanyl and Versed were administered as conscious sedation during continuous cardiorespiratory monitoring by the radiology RN. The left pedicle at L2 was then infiltrated with 1% lidocaine followed by the advancement of a Kyphon trocar needle through the left pedicle into the  posterior one-third of the vertebral body. Subsequently, the osteo drill was advanced to the anterior third of the vertebral body. The osteo drill was retracted. Through the working cannula, a Kyphon inflatable bone tamp 15 x 3 was advanced and positioned with the distal marker approximately 5 mm from the anterior aspect of the cortex. Appropriate positioning was confirmed on the AP projection. At this time, the balloon was expanded using contrast via a Kyphon inflation syringe device via micro tubing. In similar fashion, the right L2 pedicle was infiltrated with 1% lidocaine followed by the advancement of a second Kyphon trocar needle through the right pedicle into the posterior third of the vertebral body. Subsequently, the osteo drill was coaxially advanced to the anterior right third. The osteo drill was exchanged for a Kyphon inflatable bone tamp 15 x 3, advanced to the 5 mm of the anterior aspect of the cortex. The balloon was then expanded using contrast as above. Inflations were continued until there was near apposition with the superior end plate. At this time, methylmethacrylate mixture was reconstituted in the Kyphon bone mixing device system. This was then loaded into the delivery mechanism, attached to Kyphon bone fillers. The balloons were deflated and removed followed by the instillation of methylmethacrylate mixture with excellent filling in the AP and lateral projections. No extravasation was noted in the disk spaces or posteriorly into the spinal canal. No epidural venous contamination was seen. The working cannulae and the bone filler were then retrieved and removed. Hemostasis was achieved with manual compression. The patient tolerated the procedure well without immediate postprocedural complication. IMPRESSION: 1. Technically successful L2 vertebral body augmentation using balloon kyphoplasty. 2. Per CMS PQRS reporting requirements (PQRS Measure 24): Given the patient's age of greater than 16  and the fracture site (hip, distal radius, or spine), the patient should be tested for osteoporosis using DXA, and the appropriate treatment considered based on the DXA results. Signed, Criselda Peaches, MD [facility] Radiology Electronically Signed   By: Jacqulynn Cadet M.D.   On: 09/14/2017 15:20        Scheduled Meds: . atenolol  50 mg Oral Daily  . diclofenac sodium  2 g Topical QID  . fentaNYL  50 mcg Transdermal Q72H  . insulin aspart  0-9 Units Subcutaneous TID WC  . insulin glargine  15 Units  Subcutaneous QHS  . mesalamine  1,000 mg Rectal QHS  . methocarbamol  500 mg Oral TID  . pravastatin  40 mg Oral Daily  . QUEtiapine  25 mg Oral Daily  . QUEtiapine  50 mg Oral q1800  . tamsulosin  0.4 mg Oral Daily   Continuous Infusions: . sodium chloride Stopped (09/14/17 1557)     LOS: 10 days    Time spent: 25 minutes  Greater than 50% of the time spent on counseling and coordinating the care.   Leisa Lenz, MD [facility] Pager [phone]  If 7PM-7AM, please contact night-coverage [email or web address] Password [email or web address] 09/15/2017, 9:41 AM

## 2017-09-16 DIAGNOSIS — S32020S Wedge compression fracture of second lumbar vertebra, sequela: Secondary | ICD-10-CM | POA: Diagnosis not present

## 2017-09-16 DIAGNOSIS — E119 Type 2 diabetes mellitus without complications: Secondary | ICD-10-CM | POA: Diagnosis not present

## 2017-09-16 DIAGNOSIS — M6281 Muscle weakness (generalized): Secondary | ICD-10-CM | POA: Diagnosis not present

## 2017-09-16 DIAGNOSIS — S32020G Wedge compression fracture of second lumbar vertebra, subsequent encounter for fracture with delayed healing: Secondary | ICD-10-CM

## 2017-09-16 DIAGNOSIS — M545 Low back pain: Secondary | ICD-10-CM | POA: Diagnosis not present

## 2017-09-16 DIAGNOSIS — S3992XA Unspecified injury of lower back, initial encounter: Secondary | ICD-10-CM | POA: Diagnosis not present

## 2017-09-16 DIAGNOSIS — M549 Dorsalgia, unspecified: Secondary | ICD-10-CM | POA: Diagnosis not present

## 2017-09-16 DIAGNOSIS — S32020D Wedge compression fracture of second lumbar vertebra, subsequent encounter for fracture with routine healing: Secondary | ICD-10-CM | POA: Diagnosis not present

## 2017-09-16 DIAGNOSIS — Z23 Encounter for immunization: Secondary | ICD-10-CM | POA: Diagnosis present

## 2017-09-16 DIAGNOSIS — R4181 Age-related cognitive decline: Secondary | ICD-10-CM | POA: Diagnosis not present

## 2017-09-16 DIAGNOSIS — R2681 Unsteadiness on feet: Secondary | ICD-10-CM | POA: Diagnosis not present

## 2017-09-16 DIAGNOSIS — G9349 Other encephalopathy: Secondary | ICD-10-CM | POA: Diagnosis not present

## 2017-09-16 DIAGNOSIS — Z9889 Other specified postprocedural states: Secondary | ICD-10-CM | POA: Diagnosis not present

## 2017-09-16 DIAGNOSIS — R5381 Other malaise: Secondary | ICD-10-CM | POA: Diagnosis not present

## 2017-09-16 DIAGNOSIS — W19XXXA Unspecified fall, initial encounter: Secondary | ICD-10-CM | POA: Diagnosis not present

## 2017-09-16 DIAGNOSIS — Z4789 Encounter for other orthopedic aftercare: Secondary | ICD-10-CM | POA: Diagnosis not present

## 2017-09-16 DIAGNOSIS — Y92009 Unspecified place in unspecified non-institutional (private) residence as the place of occurrence of the external cause: Secondary | ICD-10-CM | POA: Diagnosis not present

## 2017-09-16 DIAGNOSIS — E1122 Type 2 diabetes mellitus with diabetic chronic kidney disease: Secondary | ICD-10-CM | POA: Diagnosis not present

## 2017-09-16 DIAGNOSIS — R262 Difficulty in walking, not elsewhere classified: Secondary | ICD-10-CM | POA: Diagnosis not present

## 2017-09-16 DIAGNOSIS — S32000S Wedge compression fracture of unspecified lumbar vertebra, sequela: Secondary | ICD-10-CM | POA: Diagnosis not present

## 2017-09-16 DIAGNOSIS — M4856XA Collapsed vertebra, not elsewhere classified, lumbar region, initial encounter for fracture: Secondary | ICD-10-CM | POA: Diagnosis not present

## 2017-09-16 DIAGNOSIS — I10 Essential (primary) hypertension: Secondary | ICD-10-CM | POA: Diagnosis not present

## 2017-09-16 DIAGNOSIS — M5126 Other intervertebral disc displacement, lumbar region: Secondary | ICD-10-CM | POA: Diagnosis not present

## 2017-09-16 DIAGNOSIS — E669 Obesity, unspecified: Secondary | ICD-10-CM | POA: Diagnosis not present

## 2017-09-16 DIAGNOSIS — M48061 Spinal stenosis, lumbar region without neurogenic claudication: Secondary | ICD-10-CM | POA: Diagnosis not present

## 2017-09-16 DIAGNOSIS — E785 Hyperlipidemia, unspecified: Secondary | ICD-10-CM | POA: Diagnosis not present

## 2017-09-16 DIAGNOSIS — N401 Enlarged prostate with lower urinary tract symptoms: Secondary | ICD-10-CM | POA: Diagnosis not present

## 2017-09-16 LAB — GLUCOSE, CAPILLARY
GLUCOSE-CAPILLARY: 91 mg/dL (ref 65–99)
Glucose-Capillary: 62 mg/dL — ABNORMAL LOW (ref 65–99)
Glucose-Capillary: 121 mg/dL — ABNORMAL HIGH (ref 65–99)
Glucose-Capillary: 203 mg/dL — ABNORMAL HIGH (ref 65–99)

## 2017-09-16 LAB — CBC
HEMATOCRIT: 33.5 % — AB (ref 39.0–52.0)
Hemoglobin: 11.1 g/dL — ABNORMAL LOW (ref 13.0–17.0)
MCH: 30.8 pg (ref 26.0–34.0)
MCHC: 33.1 g/dL (ref 30.0–36.0)
MCV: 93.1 fL (ref 78.0–100.0)
Platelets: 340 10*3/uL (ref 150–400)
RBC: 3.6 MIL/uL — ABNORMAL LOW (ref 4.22–5.81)
RDW: 12.4 % (ref 11.5–15.5)
WBC: 10.3 10*3/uL (ref 4.0–10.5)

## 2017-09-16 LAB — BASIC METABOLIC PANEL
Anion gap: 10 (ref 5–15)
BUN: 20 mg/dL (ref 6–20)
CO2: 27 mmol/L (ref 22–32)
CREATININE: 1.07 mg/dL (ref 0.61–1.24)
Calcium: 9 mg/dL (ref 8.9–10.3)
Chloride: 103 mmol/L (ref 101–111)
GFR calc Af Amer: >60 mL/min (ref >60)
GFR calc non Af Amer: >60 mL/min (ref >60)
GLUCOSE: 129 mg/dL — AB (ref 65–99)
Potassium: 3.5 mmol/L (ref 3.5–5.1)
Sodium: 140 mmol/L (ref 135–145)

## 2017-09-16 MED ORDER — TRAMADOL HCL 50 MG PO TABS: 50.0000 mg | ORAL_TABLET | Freq: Four times a day (QID) | ORAL | 0 refills | Status: DC | PRN

## 2017-09-16 MED ORDER — QUETIAPINE FUMARATE 25 MG PO TABS: 25.0000 mg | ORAL_TABLET | Freq: Every day | ORAL | 0 refills | Status: DC

## 2017-09-16 MED ORDER — INSULIN GLARGINE 100 UNIT/ML ~~LOC~~ SOLN: 15.0000 [IU] | Freq: Every day | SUBCUTANEOUS | 11 refills | Status: DC

## 2017-09-16 MED ORDER — QUETIAPINE FUMARATE 50 MG PO TABS
50.0000 mg | ORAL_TABLET | Freq: Every day | ORAL | 0 refills | Status: DC
Start: 2017-09-16 — End: 2017-11-01

## 2017-09-16 MED ORDER — SENNA 8.6 MG PO TABS: 1.0000 | ORAL_TABLET | Freq: Every evening | ORAL | 0 refills | Status: DC | PRN

## 2017-09-16 MED ORDER — METHOCARBAMOL 500 MG PO TABS: 500.0000 mg | ORAL_TABLET | Freq: Three times a day (TID) | ORAL | 0 refills | Status: DC

## 2017-09-16 MED ORDER — HYDROCODONE-ACETAMINOPHEN 5-325 MG PO TABS: 1.0000 | ORAL_TABLET | Freq: Four times a day (QID) | ORAL | 0 refills | Status: DC | PRN

## 2017-09-16 MED ORDER — FENTANYL 50 MCG/HR TD PT72: 50.0000 ug | MEDICATED_PATCH | TRANSDERMAL | 0 refills | Status: DC

## 2017-09-16 MED ORDER — ATENOLOL 50 MG PO TABS: 50.0000 mg | ORAL_TABLET | Freq: Every day | ORAL | 0 refills | Status: DC

## 2017-09-16 MED ORDER — TAMSULOSIN HCL 0.4 MG PO CAPS
0.4000 mg | ORAL_CAPSULE | Freq: Every day | ORAL | 0 refills | Status: DC
Start: 2017-09-17 — End: 2017-12-31

## 2017-09-16 MED ORDER — OXYCODONE-ACETAMINOPHEN 5-325 MG PO TABS: 0.5000 | ORAL_TABLET | Freq: Four times a day (QID) | ORAL | 0 refills | Status: DC | PRN

## 2017-09-16 NOTE — Progress Notes (Signed)
Occupational Therapy Treatment Patient Details Name: Ryan Duke MRN: [id number] DOB: 06-Jun-1941 Today's Date: 09/16/2017    History of present illness pt is a 76 y/o male with pmh significant for DM2, HTN, recent fall with resultant L2 compression fracture. admitted with worsening low back pain, decreased ability to move, decreased oral intake, weight loss and confusion.  status post L2 kyphoplasty on 10/16   OT comments  Pt will need SNF for rehab  Follow Up Recommendations  SNF    Equipment Recommendations  3 in 1 bedside commode    Recommendations for Other Services      Precautions / Restrictions Precautions Precautions: Fall;Back Precaution Comments: brace per pat. comfort. pt in zero pain.  Brace not applied Required Braces or Orthoses: Spinal Brace  For comfort only      Mobility Bed Mobility Overal bed mobility: Needs Assistance Bed Mobility: Supine to Sit;Sidelying to Sit Rolling: Min assist Sidelying to sit: HOB elevated;Min assist       General bed mobility comments: asist with legs and trunk, encouraged to roll, use bed rail, bed pad to scoot to bed edge  Transfers Overall transfer level: Needs assistance Equipment used: Rolling walker (2 wheeled) Transfers: Sit to/from [facility] Sit to Stand: Mod assist Stand pivot transfers: Mod assist       General transfer comment: pt improved this day    Balance Overall balance assessment: Needs assistance Sitting-balance support: No upper extremity supported Sitting balance-Leahy Scale: Good     Standing balance support: Bilateral upper extremity supported Standing balance-Leahy Scale: Fair                             ADL either performed or assessed with clinical judgement   ADL  pt agreed to get to chair.  Pt improved from yesterday.  Pt able to perform grooming in sitting with Set- up. Pt needs significant A with ADL activity ( bathing and dressing) and will need post  acute rehab                                                       Cognition Arousal/Alertness: Awake/alert Behavior During Therapy: WFL for tasks assessed/performed Overall Cognitive Status: Within Functional Limits for tasks assessed                                                General Comments      Pertinent Vitals/ Pain       Pain Score: 1  Pain Location: l knee Pain Descriptors / Indicators: Sore Pain Intervention(s): Limited activity within patient's tolerance;Monitored during session         Frequency  Min 2X/week        Progress Toward Goals  OT Goals(current goals can now be found in the care plan section)  Progress towards OT goals: Progressing toward goals     Plan Discharge plan remains appropriate       AM-PAC PT "6 Clicks" Daily Activity     Outcome Measure   Help from another person eating meals?: A Little Help from another person taking care of personal grooming?: A Little Help from  another person toileting, which includes using toliet, bedpan, or urinal?: A Lot Help from another person bathing (including washing, rinsing, drying)?: A Lot Help from another person to put on and taking off regular upper body clothing?: A Little Help from another person to put on and taking off regular lower body clothing?: A Lot 6 Click Score: 15    End of Session Equipment Utilized During Treatment: Rolling walker  OT Visit Diagnosis: Unsteadiness on feet (R26.81);Pain   Activity Tolerance Patient tolerated treatment well   Patient Left in chair   Nurse Communication Mobility status        Time: 5947-0761 OT Time Calculation (min): 15 min  Charges: OT General Charges $OT Visit: 1 Visit OT Treatments $Self Care/Home Management : 8-22 mins  [place], [place]   Payton Mccallum D 09/16/2017, 3:09 PM

## 2017-09-16 NOTE — Progress Notes (Signed)
Ryan Duke has received Discharge Summary and ready to accept report for this Patient discharging to this facility. Hyacinth Meeker RN accepting report for this facility. VSS. Social Worker has called ambulance for transport.

## 2017-09-16 NOTE — Progress Notes (Addendum)
Pt accepted to [facility]F for rehab- pt/family selected facility. [employer] Medicare provided authorization # [id number], rug RVB 546min/week.  # days, next review 09/18/17. Provided information to facility- will provide DC information (including summary and DC medications) via the [place]. Will need non-emergency ambulance transport- CSW completed medical necessity form and arrange transportation. Pt and his wife Rosemarie Ax aware and agreeable to plan. Thanked CSW and [employer] staff for pt's care.  Number for RN report: [phone] Will arrange transport once DC summary available and sent to facility.  Sharren Bridge, MSW, LCSW Clinical Social Work 09/16/2017 [phone]

## 2017-09-16 NOTE — Discharge Summary (Signed)
Physician Discharge Summary  Kyland No Vandyne GLO:[id number] DOB: 02/17/1941 DOA: 09/05/2017  PCP: Janith Lima, MD  Admit date: 09/05/2017 Discharge date: 09/16/2017  Admitted From: Home Disposition:  SNF  Recommendations for Outpatient Follow-up:  1. Follow up with PCP in 1-2 weeks  Home Health:No Equipment/Devices:None  Discharge Condition: Stable CODE STATUS:Full Diet recommendation: Heart Healthy   Brief/Interim Summary:  76 year old male with DM, hypertension, L2 compression fracture, no surgical intervention was required at that time back in 07/2017. His pain has however persisted. He didn't initially undergo kyphoplasty due to altered mental status, agitation, but ultimately did on 18/84 with no complications. He did have flex sig 10/12 for lower GI bleed but no acute issues identified other than stercoral ulcers and non-bleeding internal hemorrhoids. He is now stable for DC to SNF.  Discharge Diagnoses:  Active Problems:   Back pain   Closed compression fracture of L2 lumbar vertebra (HCC)   Hematochezia   Anemia, posthemorrhagic, acute   Constipation  Back pain / Closed compression fracture of L2 lumbar vertebra (HCC) - S/P kyphoplasty 10/16 - Obtain PT eval - Continue pain management efforts     Acute metabolic encephalopathy - Continue Seroquel     Hematochezia / Acute blood loss anemia -stable - Had flex sigmoidoscopy 10/12 - stercoral ulcers, non- bleeding internal hemorrhoids - No reports of bleeding - Hgb stable    New dyspnea-resolved    DM with diabetic nephropathy - Continue Lantus 15 units at bedtime and SSI - CBG's in past 24 hours: 77, 150, 110    CKD stage 3  - Cr WNL    Hypokalemia-resolved - Supplemented     Essential hypertension-controlled - Continue atenolol     Constipation - [place] for 5 days (started 10/13) - No naproxen or ibuprofen or other NSAID's - Has had bowel movement in past 24 hours   Discharge  Instructions  Discharge Instructions    Diet - low sodium heart healthy    Complete by:  As directed    Face-to-face encounter (required for Medicare/Medicaid patients)    Complete by:  As directed    I KNAPP,JON certify that this patient is under my care and that I, or a nurse practitioner or physician's assistant working with me, had a face-to-face encounter that meets the physician face-to-face encounter requirements with this patient on 09/05/2017. The encounter with the patient was in whole, or in part for the following medical condition(s) which is the primary reason for home health care (List medical condition): compression fracture   The encounter with the patient was in whole, or in part, for the following medical condition, which is the primary reason for home health care:  lumbar compression fracture   I certify that, based on my findings, the following services are medically necessary home health services:  Nursing   Reason for Medically Necessary Home Health Services:  Skilled Nursing- Change/Decline in Patient Status   My clinical findings support the need for the above services:  Pain interferes with ambulation/mobility   Further, I certify that my clinical findings support that this patient is homebound due to:  Pain interferes with ambulation/mobility   Face-to-face encounter (required for Medicare/Medicaid patients)    Complete by:  As directed    I Mesner, Corene Cornea certify that this patient is under my care and that I, or a nurse practitioner or physician's assistant working with me, had a face-to-face encounter that meets the physician face-to-face encounter requirements with this patient on  09/05/2017. The encounter with the patient was in whole, or in part for the following medical condition(s) which is the primary reason for home health care (List medical condition): new compression fracture and pain interferes with ability to get around.   The encounter with the patient was in  whole, or in part, for the following medical condition, which is the primary reason for home health care:  compression fracture   I certify that, based on my findings, the following services are medically necessary home health services:   Nursing Physical therapy     Reason for Medically Necessary Home Health Services:  Therapy- Therapeutic Exercises to Increase Strength and Endurance   My clinical findings support the need for the above services:  Pain interferes with ambulation/mobility   Further, I certify that my clinical findings support that this patient is homebound due to:  Pain interferes with ambulation/mobility   Home Health    Complete by:  As directed    To provide the following care/treatments:   PT OT RN [place]    Complete by:  As directed    To provide the following care/treatments:   PT OT RN [place]     Increase activity slowly    Complete by:  As directed      Allergies as of 09/16/2017      Reactions   Farxiga [dapagliflozin] Other (See Comments)   gout   Metformin And Related Other (See Comments)   acidosis   Atorvastatin Other (See Comments)   REACTION: h/a   Benazepril Hcl Other (See Comments)   Was not effective      Medication List    STOP taking these medications   atenolol-chlorthalidone 100-25 MG tablet Commonly known as:  TENORETIC   Insulin Glargine 300 UNIT/ML Sopn Commonly known as:  TOUJEO SOLOSTAR Replaced by:  insulin glargine 100 UNIT/ML injection   JANUVIA 100 MG tablet Generic drug:  sitaGLIPtin   Lancets Misc   losartan 100 MG tablet Commonly known as:  COZAAR     TAKE these medications   atenolol 50 MG tablet Commonly known as:  TENORMIN Take 1 tablet (50 mg total) by mouth daily.   cyclobenzaprine 10 MG tablet Commonly known as:  FLEXERIL Take 10 mg by mouth 3 (three) times daily.   fentaNYL 50 MCG/HR Commonly known as:  DURAGESIC - dosed mcg/hr Place 1 patch (50 mcg total)  onto the skin every 3 (three) days.   fentaNYL 50 MCG/HR Commonly known as:  DURAGESIC - dosed mcg/hr Place 1 patch (50 mcg total) onto the skin every 3 (three) days.   HYDROcodone-acetaminophen 5-325 MG tablet Commonly known as:  NORCO/VICODIN Take 1-2 tablets by mouth every 6 (six) hours as needed. What changed:  reasons to take this   insulin glargine 100 UNIT/ML injection Commonly known as:  LANTUS Inject 0.15 mLs (15 Units total) into the skin at bedtime. Replaces:  Insulin Glargine 300 UNIT/ML Sopn   methocarbamol 500 MG tablet Commonly known as:  ROBAXIN Take 1 tablet (500 mg total) by mouth 3 (three) times daily.   ondansetron 4 MG disintegrating tablet Commonly known as:  ZOFRAN ODT Take 1 tablet (4 mg total) by mouth every 8 (eight) hours as needed for nausea or vomiting.   oxyCODONE-acetaminophen 5-325 MG tablet Commonly known as:  PERCOCET/ROXICET Take 0.5-1 tablets by mouth 4 (four) times daily as needed for moderate pain.   pravastatin 40 MG tablet Commonly  known as:  PRAVACHOL TAKE 1 TABLET ONCE DAILY.   QUEtiapine 50 MG tablet Commonly known as:  SEROQUEL Take 1 tablet (50 mg total) by mouth daily at 6 PM.   QUEtiapine 25 MG tablet Commonly known as:  SEROQUEL Take 1 tablet (25 mg total) by mouth daily.   senna 8.6 MG Tabs tablet Commonly known as:  SENOKOT Take 1 tablet (8.6 mg total) by mouth at bedtime as needed for mild constipation.   tamsulosin 0.4 MG Caps capsule Commonly known as:  FLOMAX Take 1 capsule (0.4 mg total) by mouth daily.   traMADol 50 MG tablet Commonly known as:  ULTRAM Take 1 tablet (50 mg total) by mouth every 6 (six) hours as needed for moderate pain or severe pain.            Durable Medical Equipment        Start     Ordered   09/06/17 1655  For home use only DME Tub bench  Once     09/06/17 1655   09/06/17 1655  For home use only DME 3 n 1  Once     09/06/17 1655      Contact information for follow-up  providers    Janith Lima, MD Follow up in 2 week(s).   Specialty:  Internal Medicine Contact information: [address] [address] [phone]            Contact information for after-discharge care    [facility] Follow up.   Specialty:  Skilled Nursing Facility Contact information: [address] [address] [phone]                 Allergies  Allergen Reactions  . Wilder Glade [Dapagliflozin] Other (See Comments)    gout  . Metformin And Related Other (See Comments)    acidosis  . Atorvastatin Other (See Comments)    REACTION: h/a  . Benazepril Hcl Other (See Comments)    Was not effective     Procedures/Studies: Dg Chest 2 View  Result Date: 08/20/2017 CLINICAL DATA:  Back pain after a fall tonight. History of hypertension, diabetes, former smoker. EXAM: CHEST  2 VIEW COMPARISON:  12/21/2013 FINDINGS: Cardiac enlargement. Pulmonary vascularity is normal. No airspace disease or consolidation in the lungs. Prominent apical cardiac fat pad. No pleural effusions. No pneumothorax. Mediastinal contours appear intact. Degenerative changes in the spine. IMPRESSION: Cardiac enlargement.  No evidence of active pulmonary disease. Electronically Signed   By: Lucienne Capers M.D.   On: 08/20/2017 22:20   Dg Lumbar Spine Complete  Result Date: 09/05/2017 CLINICAL DATA:  76 year old male with lumbar spine pain, altered mental status and confusion EXAM: LUMBAR SPINE - COMPLETE 4+ VIEW COMPARISON:  Prior lumbar spine radiographs 08/20/2017 FINDINGS: Slight interval progression of height loss of the L2 compression fracture which is now greater than 50% centrally. No new compression fracture identified. Stable multilevel degenerative disc disease and advanced lower lumbar facet arthropathy. Stable mild grade 1 anterolisthesis of L4 on L5. The visualized bowel gas pattern is unremarkable. IMPRESSION:  1. Slight interval progression of height loss of the previously noted L2 compression fracture now representing greater than 50% height loss within the central aspect of the vertebral body. 2. No new fracture or malalignment. 3. Multilevel degenerative disc disease and lower lumbar facet arthropathy. Electronically Signed   By: Jacqulynn Cadet M.D.   On: 09/05/2017 12:52   Dg  Lumbar Spine Complete  Result Date: 08/20/2017 CLINICAL DATA:  Low back pain radiating down to the legs after a fall. History of hypertension and diabetes. Former smoker. EXAM: LUMBAR SPINE - COMPLETE 4+ VIEW COMPARISON:  None. FINDINGS: There is acute appearing compression deformity of the L2 vertebra with about 30% loss of height. Cortical irregularities are demonstrated in the anterior vertebral margin and along the superior endplate. No retropulsion of fracture fragments. Normal alignment of the lumbar spine. Diffuse degenerative changes with narrowed interspaces and endplate hypertrophic changes throughout. Degenerative changes in the lumbar facet joints. No destructive or expansile bone lesions. Aortic calcification. IMPRESSION: Acute appearing compression fracture of the L2 vertebra with about 30% loss of height. No retropulsion of fracture fragments. Normal alignment. Electronically Signed   By: Lucienne Capers M.D.   On: 08/20/2017 22:20   Ct Head Wo Contrast  Result Date: 08/20/2017 CLINICAL DATA:  Patient fell outside of home. No loss of consciousness. Posttraumatic headache. Drinking scotch. EXAM: CT HEAD WITHOUT CONTRAST TECHNIQUE: Contiguous axial images were obtained from the base of the skull through the vertex without intravenous contrast. COMPARISON:  MRI brain 06/17/2013.  CT head 02/20/2009 FINDINGS: Brain: Mild diffuse cerebral atrophy. Mild ventricular dilatation consistent with central atrophy. Patchy low-attenuation changes in the deep white matter likely representing small vessel ischemia. No mass effect or  midline shift. No abnormal extra-axial fluid collections. Gray-white matter junctions are distinct. Basal cisterns are not effaced. Mild prominence of cisterna magna. No acute intracranial hemorrhage. Vascular: Vascular calcifications demonstrated in the cervical carotid and vertebrobasilar arteries. Skull: The calvarium appears intact. Sinuses/Orbits: Mucosal thickening in the paranasal sinuses. No acute air-fluid levels. Mastoid air cells are not opacified. Other: None. IMPRESSION: No acute intracranial abnormalities. Mild chronic atrophy and small vessel ischemic changes. Electronically Signed   By: Lucienne Capers M.D.   On: 08/20/2017 22:31   Mr Lumbar Spine Wo Contrast  Result Date: 09/06/2017 CLINICAL DATA:  L2 compression fracture EXAM: MRI LUMBAR SPINE WITHOUT CONTRAST TECHNIQUE: Multiplanar, multisequence MR imaging of the lumbar spine was performed. No intravenous contrast was administered. COMPARISON:  Lumbar spine radiograph 09/05/2017, 09/02/2017 and 08/20/2017 FINDINGS: Segmentation:  Standard. Alignment:  Physiologic. Vertebrae: Burst fracture of L2 involving both endplates and both walls with approximately 50% central height loss. No retropulsion. There is a small fluid-filled cleft anteriorly. Mild marrow edema. No other acute marrow abnormality. No ligamentous disruption. Conus medullaris: Extends to the T12 level and appears normal. Paraspinal and other soft tissues: Negative. Disc levels: Above the T12 level, there is no disc herniation, spinal canal stenosis or neuroforaminal stenosis. T12-L1: Normal disc space and facets. No spinal canal or neuroforaminal stenosis. L1-L2: Normal disc space and facets. No spinal canal or neuroforaminal stenosis. L2-L3: Fluid within both facets. Small disc bulge with mild spinal canal stenosis. Bilateral moderate neural foraminal stenosis. L3-L4: Severe right and mild left facet hypertrophy. Small diffuse disc bulge. Moderate bilateral neural foraminal  stenosis. No spinal canal stenosis. L4-L5: Severe bilateral facet hypertrophy with medium-sized diffuse disc bulge. No central spinal canal stenosis. Mild narrowing of both lateral recesses. Moderate bilateral neural foraminal stenosis. L5-S1: Minimal disc bulge and mild facet hypertrophy. No spinal canal stenosis or neural foraminal stenosis. Visualized sacrum: Normal. IMPRESSION: 1. Subacute L2 burst fracture, which involves both endplates and both walls. No retropulsion. Small anterior osteonecrotic cleft. 2. Moderate bilateral neural foraminal stenosis at the L2-L5 levels due to combination of disc bulge and facet arthrosis. 3. Multilevel moderate to severe facet arthrosis, worst at  L4-L5. Widened, fluid-filled facet joint spaces at this level may indicate a degree of segmental instability. Additionally, facet disease may serve as a source of local low back pain. Electronically Signed   By: Ulyses Jarred M.D.   On: 09/06/2017 22:31   Dg Chest Port 1 View  Result Date: 09/15/2017 CLINICAL DATA:  Exertional shortness of breath. History of aortic valve disorder, chronic renal insufficiency, former smoker. EXAM: PORTABLE CHEST 1 VIEW COMPARISON:  Chest x-ray of August 20, 2017 FINDINGS: The lungs are well-expanded and clear. The heart and pulmonary vascularity are normal. The mediastinum is normal in width. There is mild tortuosity of the ascending and descending thoracic aorta. The bony thorax exhibits no acute abnormality. IMPRESSION: There is no acute cardiopulmonary abnormality. Electronically Signed   By: David  Martinique M.D.   On: 09/15/2017 10:31   Ir Kypho Lumbar Inc Fx Reduce Bone Bx Uni/bil Cannulation Inc/imaging  Result Date: 09/14/2017 CLINICAL DATA:  76 year old male with acute L2 compression fracture which has not been responsive to conservative management. EXAM: FLUOROSCOPIC GUIDED KYPHOPLASTY OF THE L2 VERTEBRAL BODY COMPARISON:  MRI lumbar spine 09/06/2017 MEDICATIONS: As antibiotic  prophylaxis, was ordered pre-procedure and administered intravenously within 1 hour of incision. ANESTHESIA/SEDATION: Moderate (conscious) sedation was employed during this procedure. A total of Versed 2.5 mg and Fentanyl 125 mcg was administered intravenously. Moderate Sedation Time: 30 minutes. The patient's level of consciousness and vital signs were monitored continuously by radiology nursing throughout the procedure under my direct supervision. FLUOROSCOPY TIME:  7 minutes, 42 seconds (536 mGy) COMPLICATIONS: None immediate. TECHNIQUE: The procedure, risks (including but not limited to bleeding, infection, organ damage), benefits, and alternatives were explained to the patient. Questions regarding the procedure were encouraged and answered. The patient understands and consents to the procedure. The patient was placed prone on the fluoroscopic table. The skin overlying the upper thoracic region was then prepped and draped in the usual sterile fashion. Maximal barrier sterile technique was utilized including caps, mask, sterile gowns, sterile gloves, sterile drape, hand hygiene and skin antiseptic. Intravenous Fentanyl and Versed were administered as conscious sedation during continuous cardiorespiratory monitoring by the radiology RN. The left pedicle at L2 was then infiltrated with 1% lidocaine followed by the advancement of a Kyphon trocar needle through the left pedicle into the posterior one-third of the vertebral body. Subsequently, the osteo drill was advanced to the anterior third of the vertebral body. The osteo drill was retracted. Through the working cannula, a Kyphon inflatable bone tamp 15 x 3 was advanced and positioned with the distal marker approximately 5 mm from the anterior aspect of the cortex. Appropriate positioning was confirmed on the AP projection. At this time, the balloon was expanded using contrast via a Kyphon inflation syringe device via micro tubing. In similar fashion, the right L2  pedicle was infiltrated with 1% lidocaine followed by the advancement of a second Kyphon trocar needle through the right pedicle into the posterior third of the vertebral body. Subsequently, the osteo drill was coaxially advanced to the anterior right third. The osteo drill was exchanged for a Kyphon inflatable bone tamp 15 x 3, advanced to the 5 mm of the anterior aspect of the cortex. The balloon was then expanded using contrast as above. Inflations were continued until there was near apposition with the superior end plate. At this time, methylmethacrylate mixture was reconstituted in the Kyphon bone mixing device system. This was then loaded into the delivery mechanism, attached to Kyphon bone fillers. The balloons  were deflated and removed followed by the instillation of methylmethacrylate mixture with excellent filling in the AP and lateral projections. No extravasation was noted in the disk spaces or posteriorly into the spinal canal. No epidural venous contamination was seen. The working cannulae and the bone filler were then retrieved and removed. Hemostasis was achieved with manual compression. The patient tolerated the procedure well without immediate postprocedural complication. IMPRESSION: 1. Technically successful L2 vertebral body augmentation using balloon kyphoplasty. 2. Per CMS PQRS reporting requirements (PQRS Measure 24): Given the patient's age of greater than 57 and the fracture site (hip, distal radius, or spine), the patient should be tested for osteoporosis using DXA, and the appropriate treatment considered based on the DXA results. Signed, Criselda Peaches, MD [facility] Electronically Signed   By: Jacqulynn Cadet M.D.   On: 09/14/2017 15:20    Discharge Exam: Vitals:   09/16/17 0637 09/16/17 1304  BP: (!) 169/78 (!) 141/76  Pulse: 86 74  Resp: 18 20  Temp: 98.5 F (36.9 C) 97.8 F (36.6 C)  SpO2: 97% 100%    Vitals:   09/15/17 1341 09/15/17 2159 09/16/17 0637 09/16/17 1304  BP: (!) 149/70 (!) 146/69 (!) 169/78 (!) 141/76  Pulse: 87 86 86 74  Resp: 20 20 18 20   Temp: 98.1 F (36.7 C) 99.2 F (37.3 C) 98.5 F (36.9 C) 97.8 F (36.6 C)  TempSrc: Oral Axillary Oral Oral  SpO2: 98% 98% 97% 100%  Weight:      Height:        General: Pt is alert, awake, not in acute distress Cardiovascular: RRR, S1/S2 +, no rubs, no gallops Respiratory: CTA bilaterally, no wheezing, no rhonchi Abdominal: Soft, NT, ND, bowel sounds + Extremities: no edema, no cyanosis    The results of significant diagnostics from this hospitalization (including imaging, microbiology, ancillary and laboratory) are listed below for reference.     Microbiology: Recent Results (from the past 240 hour(s))  MRSA PCR Screening     Status: None   Collection Time: 09/08/17  5:23 PM  Result Value Ref Range Status   MRSA by PCR NEGATIVE NEGATIVE Final    Comment:        The GeneXpert MRSA Assay (FDA approved for NASAL specimens only), is one component of a comprehensive MRSA colonization surveillance program. It is not intended to diagnose MRSA infection nor to guide or monitor treatment for MRSA infections.      Labs: BNP (last 3 results) No results for input(s): BNP in the last 8760 hours. Basic Metabolic Panel:  Recent Labs Lab 09/11/17 0339 09/12/17 0456 09/13/17 0512 09/14/17 0453 09/16/17 0510  NA 138 140 137 141 140  K 3.5 3.1* 3.4* 3.8 3.5  CL 105 106 101 106 103  CO2 23 27 26 25 27   GLUCOSE 108* 113* 115* 120* 129*  BUN 27* 21* 16 18 20   CREATININE 1.32* 1.07 1.01 1.09 1.07  CALCIUM 8.5* 8.8* 8.8* 9.0 9.0   Liver Function Tests: No results for input(s): AST, ALT, ALKPHOS, BILITOT, PROT, ALBUMIN in the last 168 hours. No results for input(s): LIPASE, AMYLASE in the last 168 hours. No results for input(s): AMMONIA in the last 168 hours. CBC:  Recent Labs Lab 09/11/17 0339 09/12/17 0456  09/13/17 0512 09/14/17 0453 09/16/17 0510  WBC 9.9 8.8 10.0 10.5 10.3  HGB 10.7* 10.5* 11.5* 11.8* 11.1*  HCT 32.0* 31.1* 33.6* 35.0* 33.5*  MCV 93.0 92.8 92.3 92.3 93.1  PLT 300 309 307 333 340   Cardiac Enzymes: No results for input(s): CKTOTAL, CKMB, CKMBINDEX, TROPONINI in the last 168 hours. BNP: Invalid input(s): POCBNP CBG:  Recent Labs Lab 09/15/17 2203 09/16/17 0907 09/16/17 0938 09/16/17 1145 09/16/17 1627  GLUCAP 116* 62* 121* 203* 91   D-Dimer No results for input(s): DDIMER in the last 72 hours. Hgb A1c No results for input(s): HGBA1C in the last 72 hours. Lipid Profile No results for input(s): CHOL, HDL, LDLCALC, TRIG, CHOLHDL, LDLDIRECT in the last 72 hours. Thyroid function studies No results for input(s): TSH, T4TOTAL, T3FREE, THYROIDAB in the last 72 hours.  Invalid input(s): FREET3 Anemia work up No results for input(s): VITAMINB12, FOLATE, FERRITIN, TIBC, IRON, RETICCTPCT in the last 72 hours. Urinalysis    Component Value Date/Time   COLORURINE YELLOW 09/05/2017 1702   APPEARANCEUR HAZY (A) 09/05/2017 1702   LABSPEC 1.030 09/05/2017 1702   PHURINE 5.0 09/05/2017 1702   GLUCOSEU NEGATIVE 09/05/2017 1702   GLUCOSEU NEGATIVE 03/15/2017 1002   HGBUR NEGATIVE 09/05/2017 1702   BILIRUBINUR NEGATIVE 09/05/2017 1702   KETONESUR NEGATIVE 09/05/2017 1702   PROTEINUR 100 (A) 09/05/2017 1702   UROBILINOGEN 0.2 03/15/2017 1002   NITRITE NEGATIVE 09/05/2017 1702   LEUKOCYTESUR NEGATIVE 09/05/2017 1702   Sepsis Labs Invalid input(s): PROCALCITONIN,  WBC,  LACTICIDVEN Microbiology Recent Results (from the past 240 hour(s))  MRSA PCR Screening     Status: None   Collection Time: 09/08/17  5:23 PM  Result Value Ref Range Status   MRSA by PCR NEGATIVE NEGATIVE Final    Comment:        The GeneXpert MRSA Assay (FDA approved for NASAL specimens only), is one component of a comprehensive MRSA colonization surveillance program. It is not intended to  diagnose MRSA infection nor to guide or monitor treatment for MRSA infections.      Time coordinating discharge: Over 30 minutes  SIGNED:   Rodena Goldmann, DO  [facility] 09/16/2017, 6:26 PM Pager [phone]  If 7PM-7AM, please contact night-coverage [email or web address] Password [email or web address]

## 2017-09-16 NOTE — Progress Notes (Signed)
CSW received a request from 1st shift 4 [place] CSW to monitor pt's chart and ascertain when provider signed the D/C summary.  CSW spoke to provider, sent the signed D/C summary to [facility] via the hub and updated the RN who stated she would call report.  CSW called [facility] at the request of pt's RN.    Please reconsult if future social work needs arise.  CSW signing off, as social work intervention is no longer needed.  Alphonse Guild. Sylvan Lahm, LCSW, LCAS, CSI Clinical Social Worker Ph: [phone]

## 2017-09-16 NOTE — NC FL2 (Signed)
[place] LEVEL OF CARE SCREENING TOOL     IDENTIFICATION  Patient Name: Ryan Duke Birthdate: July 31, 1941 Sex: male Admission Date (Current Location): 09/05/2017  [facility] and [place] Number:  Herbalist and Address:  [facility],  [place] [place], [place]      Provider Number: [id number]  Attending Physician Name and Address:  Rodena Goldmann, DO  Relative Name and Phone Number:  Kosanke,Claudia  [phone]    Current Level of Care: Hospital Recommended Level of Care: [place] Prior Approval Number:    Date Approved/Denied:   PASRR Number:   [id number]   Discharge Plan:      Current Diagnoses: Patient Active Problem List   Diagnosis Date Noted  . Hematochezia   . Anemia, posthemorrhagic, acute   . Constipation   . Closed compression fracture of L2 lumbar vertebra ([place])   . Back pain 09/05/2017  . Mild intermittent asthma without complication 44/81/8563  . Gout 11/17/2016  . Alcoholic hepatitis without ascites 11/12/2016  . Primary gout 07/07/2016  . Depression, acute 09/15/2015  . Erectile dysfunction 03/06/2014  . Hypokalemia 06/08/2013  . Routine general medical examination at a health care facility 09/18/2011  . GERD 07/22/2010  . BPH (benign prostatic hyperplasia) 09/02/2009  . Kidney disease, chronic, 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) ([place]) 02/26/2009  . Aortic valve disorder 02/25/2009  . PERIPHERAL VASCULAR DISEASE 02/25/2009  . Type II diabetes mellitus with manifestations ([place]) 11/01/2007  . Hyperlipidemia with target LDL less than 100 07/01/2007  . Obesity 07/01/2007  . Essential hypertension 07/01/2007    Orientation RESPIRATION BLADDER Height & Weight     Self, Time, Situation, Place  Normal Incontinent Weight: 222 lb (100.7 kg) Height:  5\' 11"  (180.3 cm)  BEHAVIORAL SYMPTOMS/MOOD NEUROLOGICAL BOWEL NUTRITION STATUS  Other (Comment) (Forgetful)     Diet (regular)  AMBULATORY STATUS  COMMUNICATION OF NEEDS Skin   Extensive Assist Verbally Normal                       Personal Care Assistance Level of Assistance  Bathing, Feeding, Dressing Bathing Assistance: Maximum assistance Feeding assistance: Independent Dressing Assistance: Maximum assistance     Functional Limitations Info             SPECIAL CARE FACTORS FREQUENCY  PT (By licensed PT), OT (By licensed OT)     PT Frequency: 5 OT Frequency: 5            Contractures      Additional Factors Info  Allergies, Code Status (Full, ) Code Status Info: full Allergies Info: FARXIGA DAPAGLIFLOZIN, METFORMIN AND RELATED, ATORVASTATIN, BENAZEPRIL HCL            Current Medications (09/16/2017):  This is the current hospital active medication list Current Facility-Administered Medications  Medication Dose Route Frequency Provider Last Rate Last Dose  . 0.9 %  sodium chloride infusion   Intravenous Continuous Robbie Lis, MD   Stopped at 09/14/17 1557  . acetaminophen (TYLENOL) tablet 650 mg  650 mg Oral Q6H PRN Arrien, Jimmy Picket, MD   650 mg at 09/15/17 1316   Or  . acetaminophen (TYLENOL) suppository 650 mg  650 mg Rectal Q6H PRN Arrien, Jimmy Picket, MD      . atenolol (TENORMIN) tablet 50 mg  50 mg Oral Daily Georgette Shell, MD   50 mg at 09/16/17 0943  . diclofenac sodium (VOLTAREN) 1 % transdermal gel  2 g  2 g Topical QID Tawni Millers, MD   2 g at 09/16/17 [phone]  . feeding supplement (ENSURE ENLIVE) (ENSURE ENLIVE) liquid 237 mL  237 mL Oral BID BM Robbie Lis, MD   237 mL at 09/16/17 1000  . fentaNYL (DURAGESIC - dosed mcg/hr) patch 50 mcg  50 mcg Transdermal Q72H Mesner, Jason, MD   Stopped at 09/08/17 1615  . hydrALAZINE (APRESOLINE) injection 10 mg  10 mg Intravenous Q6H PRN Reyne Dumas, MD   10 mg at 09/07/17 2158  . insulin aspart (novoLOG) injection 0-9 Units  0-9 Units Subcutaneous TID WC Arrien, Jimmy Picket, MD   Stopped at 09/16/17 0800  .  insulin glargine (LANTUS) injection 15 Units  15 Units Subcutaneous QHS Tawni Millers, MD   15 Units at 09/15/17 2139  . iopamidol (ISOVUE-300) 61 % injection 50 mL  50 mL Other Once PRN Jacqulynn Cadet, MD      . ipratropium-albuterol (DUONEB) 0.5-2.5 (3) MG/3ML nebulizer solution 3 mL  3 mL Nebulization Q4H PRN Robbie Lis, MD      . methocarbamol (ROBAXIN) tablet 500 mg  500 mg Oral TID Cristal Ford, DO   Stopped at 09/15/17 1818  . ondansetron (ZOFRAN) tablet 4 mg  4 mg Oral Q6H PRN Arrien, Jimmy Picket, MD       Or  . ondansetron [facility]) injection 4 mg  4 mg Intravenous Q6H PRN Arrien, Jimmy Picket, MD   4 mg at 09/05/17 2135  . oxyCODONE-acetaminophen (PERCOCET/ROXICET) 5-325 MG per tablet 1 tablet  1 tablet Oral QID PRN Arrien, Jimmy Picket, MD   1 tablet at 09/06/17 2152  . phenol (CHLORASEPTIC) mouth spray 1 spray  1 spray Mouth/Throat PRN Cristal Ford, DO      . pravastatin (PRAVACHOL) tablet 40 mg  40 mg Oral Daily Arrien, Jimmy Picket, MD   40 mg at 09/16/17 0943  . QUEtiapine (SEROQUEL) tablet 25 mg  25 mg Oral Daily Cristal Ford, DO   25 mg at 09/16/17 0849  . QUEtiapine (SEROQUEL) tablet 50 mg  50 mg Oral q1800 Cristal Ford, DO   50 mg at 09/15/17 1819  . senna (SENOKOT) tablet 8.6 mg  1 tablet Oral QHS PRN Robbie Lis, MD      . sodium chloride flush (NS) 0.9 % injection 3 mL  3 mL Intravenous PRN Arrien, Jimmy Picket, MD      . tamsulosin [facility]) capsule 0.4 mg  0.4 mg Oral Daily Cristal Ford, DO   0.4 mg at 09/16/17 1017  . traMADol (ULTRAM) tablet 50 mg  50 mg Oral Q6H PRN Jani Gravel, MD   50 mg at 09/12/17 5102     Discharge Medications: Please see discharge summary for a list of discharge medications.  Relevant Imaging Results:  Relevant Lab Results:   Additional Information [id number]  Nickie Retort Work [phone]

## 2017-09-16 NOTE — Clinical Social Work Note (Signed)
Clinical Social Work Assessment  Patient Details  Name: Ryan Duke MRN: [id number] Date of Birth: 09-01-1941  Date of referral:  09/16/17               Reason for consult:  Facility Placement                Permission sought to share information with:  Family Supports Permission granted to share information::  Yes, Verbal Permission Granted  Name::     wife Investment banker, operational::     Relationship::     Contact Information:     Housing/Transportation Living arrangements for the past 2 months:  Single Family Home Source of Information:  Patient, Spouse Patient Interpreter Needed:  None Criminal Activity/Legal Involvement Pertinent to Current Situation/Hospitalization:  No - Comment as needed Significant Relationships:  Spouse, Other Family Members, Friend Lives with:  Self, Spouse Do you feel safe going back to the place where you live?  Yes Need for family participation in patient care:  No (Coment)  Care giving concerns:  Pt is from home where he resides with his wife. Prior to admission states he was completely independent, however following "falling in his backyard before he came in the hospital" he is unable to ambulate without much assistance.   Social Worker assessment / plan:  CSW consulted for assistance with SNF referral for ST rehab. Met with pt and wife at bedside. Both very welcoming and engaged. Report they feel SNF is best aftercare plan for pt. CSW discussed referral/insurance authorization process. Pt discussed his goals- primarily to "go to rehab and move around everyday until I can go back home with my wife." CSW intern assisted in obtaining PASSR and completing FL2, initiating insurance pre-authorization. CSW referred pt to the facilities he and wife requested. Today, of the facilities pt is interested in, [facility] is only one with bed availability today. Admissions is reviewing and will contact CSW. Should bed not be offered, bed search will be expanded.   Plan:  Referred to SNF. Will follow up with bed offers and with insurance authorization request.   Employment status:  Retired Nurse, adult PT Recommendations:  [place] / Referral to community resources:  [place]  Patient/Family's Response to care:  Both pt and wife engaged and appreciative of care  Patient/Family's Understanding of and Emotional Response to Diagnosis, Current Treatment, and Prognosis:  Chart indicates pt has shown some confusion during stay, however he was alert and oriented at time of this assessment, made meaningful conversation and was goal-oriented and appropriate with his responses.  Pt does seem surprised to be planning for DC, however he seem adaptive and wife was supportive of planning.   Emotional Assessment Appearance:  Appears stated age Attitude/Demeanor/Rapport:   (pleasant) Affect (typically observed):  Accepting, Adaptable, Calm Orientation:  Oriented to Self, Oriented to Place, Oriented to  Time, Oriented to Situation Alcohol / Substance use:  Not Applicable Psych involvement (Current and /or in the community):  No (Comment)  Discharge Needs  Concerns to be addressed:  Discharge Planning Concerns Readmission within the last 30 days:  No Current discharge risk:  None Barriers to Discharge:      Nila Nephew, LCSW 09/16/2017, 10:46 AM  [phone]

## 2017-09-16 NOTE — Progress Notes (Signed)
Pt to discharge to [facility] once discharge summary is completed by MD. RN called [facility] to give report [facility] is requesting report be called once discharge summary has been received. Will report this information the Night RN.

## 2017-09-16 NOTE — Progress Notes (Signed)
Patient transferred via PTAR in stable condition. Report called to receiving location by previous shift RN.

## 2017-09-17 ENCOUNTER — Telehealth: Payer: Self-pay

## 2017-09-17 DIAGNOSIS — M545 Low back pain: Secondary | ICD-10-CM | POA: Diagnosis not present

## 2017-09-17 DIAGNOSIS — E669 Obesity, unspecified: Secondary | ICD-10-CM | POA: Diagnosis not present

## 2017-09-17 DIAGNOSIS — E1122 Type 2 diabetes mellitus with diabetic chronic kidney disease: Secondary | ICD-10-CM | POA: Diagnosis not present

## 2017-09-17 DIAGNOSIS — I10 Essential (primary) hypertension: Secondary | ICD-10-CM | POA: Diagnosis not present

## 2017-09-17 NOTE — Telephone Encounter (Signed)
LVM for pt to call back as soon as possible.   Pt is on TCM list for L2 closed fractured. D/c'ed on 09/16/2017. Follow up with PCP 1-2 weeks per discharge instructions.

## 2017-09-19 ENCOUNTER — Encounter: Payer: Self-pay | Admitting: Internal Medicine

## 2017-09-28 DIAGNOSIS — M6281 Muscle weakness (generalized): Secondary | ICD-10-CM | POA: Diagnosis not present

## 2017-09-28 DIAGNOSIS — S32000S Wedge compression fracture of unspecified lumbar vertebra, sequela: Secondary | ICD-10-CM | POA: Diagnosis not present

## 2017-09-28 DIAGNOSIS — E119 Type 2 diabetes mellitus without complications: Secondary | ICD-10-CM | POA: Diagnosis not present

## 2017-09-28 DIAGNOSIS — Z4789 Encounter for other orthopedic aftercare: Secondary | ICD-10-CM | POA: Diagnosis not present

## 2017-09-29 DIAGNOSIS — R262 Difficulty in walking, not elsewhere classified: Secondary | ICD-10-CM | POA: Diagnosis not present

## 2017-09-29 DIAGNOSIS — R5381 Other malaise: Secondary | ICD-10-CM | POA: Diagnosis not present

## 2017-09-29 DIAGNOSIS — M545 Low back pain: Secondary | ICD-10-CM | POA: Diagnosis not present

## 2017-09-29 DIAGNOSIS — M6281 Muscle weakness (generalized): Secondary | ICD-10-CM | POA: Diagnosis not present

## 2017-10-05 DIAGNOSIS — R262 Difficulty in walking, not elsewhere classified: Secondary | ICD-10-CM | POA: Diagnosis not present

## 2017-10-05 DIAGNOSIS — M545 Low back pain: Secondary | ICD-10-CM | POA: Diagnosis not present

## 2017-10-05 DIAGNOSIS — M6281 Muscle weakness (generalized): Secondary | ICD-10-CM | POA: Diagnosis not present

## 2017-10-05 DIAGNOSIS — R5381 Other malaise: Secondary | ICD-10-CM | POA: Diagnosis not present

## 2017-10-07 ENCOUNTER — Ambulatory Visit
Admission: RE | Admit: 2017-10-07 | Discharge: 2017-10-07 | Disposition: A | Discharge status: Home / Self Care | Payer: Medicare Other | Source: Ambulatory Visit | Attending: Interventional Radiology | Admitting: Interventional Radiology

## 2017-10-07 ENCOUNTER — Other Ambulatory Visit (HOSPITAL_COMMUNITY): Payer: Self-pay | Admitting: Interventional Radiology

## 2017-10-07 DIAGNOSIS — S32020S Wedge compression fracture of second lumbar vertebra, sequela: Secondary | ICD-10-CM | POA: Diagnosis not present

## 2017-10-07 DIAGNOSIS — S32010A Wedge compression fracture of first lumbar vertebra, initial encounter for closed fracture: Secondary | ICD-10-CM

## 2017-10-07 DIAGNOSIS — M545 Low back pain, unspecified: Secondary | ICD-10-CM

## 2017-10-07 DIAGNOSIS — S32020D Wedge compression fracture of second lumbar vertebra, subsequent encounter for fracture with routine healing: Secondary | ICD-10-CM | POA: Diagnosis not present

## 2017-10-07 DIAGNOSIS — Z9889 Other specified postprocedural states: Secondary | ICD-10-CM | POA: Diagnosis not present

## 2017-10-07 DIAGNOSIS — I10 Essential (primary) hypertension: Secondary | ICD-10-CM | POA: Diagnosis not present

## 2017-10-07 HISTORY — PX: IR RADIOLOGIST EVAL & MGMT: IMG5224

## 2017-10-07 NOTE — Progress Notes (Signed)
Chief Complaint: Patient was seen in consultation today for  Chief Complaint  Patient presents with  . Follow-up    3 wk follow up Kyphoplasty   at the request of Ryan Duke  Referring Physician(s): Ryan Duke  History of Present Illness: Ryan Duke is a 76 y.o. male who underwent percutaneous cement augmentation with kyphoplasty of an L2 compression fracture as an inpatient at [facility] on 10/15/2017.  He suffered a fall at home and initially seen as an outpatient and initiated on conservative therapy with oral narcotics and a back brace. He developed progressive pain and was ultimately admitted for pain control. During his hospital stay, he developed some medication related delirium.  Ultimately, he was successfully treated with kyphoplasty on 10/15/2017. Ryan Duke is having persistent lumbar spine pain when he stands and ambulates. He describes the pain as a sharp, jabbing sensation that radiates into both of his legs, greater on the left than the right. Sometimes the pain radiates all the way down his left lower extremity.  He has pain relief when he reclines in a chair and elevates his legs. He denies fever, chills, shortness of breath or chest pain. No changes in his bowel or bladder habits.  His wife is very concerned that he is becoming deconditioned given his inability to be active and mobile. Prior to his fall and fracture he was completely independent and very active.  Past Medical History:  Diagnosis Date  . Diabetes mellitus    type 2  . Hyperlipidemia   . Hypertension   . Hypogonadism, male   . Pancreatitis 2008  . Renal insufficiency     Past Surgical History:  Procedure Laterality Date  . IR KYPHO LUMBAR INC FX REDUCE BONE BX UNI/BIL CANNULATION INC/IMAGING  09/14/2017    Allergies: Farxiga [dapagliflozin]; Metformin and related; Atorvastatin; and Benazepril hcl  Medications: Prior to Admission medications   Medication Sig  Start Date End Date Taking? Authorizing Provider  atenolol (TENORMIN) 50 MG tablet Take 1 tablet (50 mg total) by mouth daily. 09/17/17  Yes Shah, Pratik D, DO  fentaNYL (DURAGESIC - DOSED MCG/HR) 50 MCG/HR Place 1 patch (50 mcg total) onto the skin every 3 (three) days. 09/17/17  Yes Shah, Pratik D, DO  fentaNYL (DURAGESIC - DOSED MCG/HR) 50 MCG/HR Place 1 patch (50 mcg total) onto the skin every 3 (three) days. 09/16/17  Yes Shah, Pratik D, DO  HYDROcodone-acetaminophen (NORCO/VICODIN) 5-325 MG tablet Take 1-2 tablets by mouth every 6 (six) hours as needed. 09/16/17  Yes Shah, Pratik D, DO  insulin glargine (LANTUS) 100 UNIT/ML injection Inject 0.15 mLs (15 Units total) into the skin at bedtime. 09/16/17  Yes Shah, Pratik D, DO  methocarbamol (ROBAXIN) 500 MG tablet Take 1 tablet (500 mg total) by mouth 3 (three) times daily. 09/16/17  Yes Shah, Pratik D, DO  ondansetron (ZOFRAN ODT) 4 MG disintegrating tablet Take 1 tablet (4 mg total) by mouth every 8 (eight) hours as needed for nausea or vomiting. 08/21/17  Yes Ward, Kristen N, DO  pravastatin (PRAVACHOL) 40 MG tablet TAKE 1 TABLET ONCE DAILY. 12/07/16  Yes Janith Lima, MD  senna (SENOKOT) 8.6 MG TABS tablet Take 1 tablet (8.6 mg total) by mouth at bedtime as needed for mild constipation. 09/16/17  Yes Shah, Pratik D, DO  tamsulosin (FLOMAX) 0.4 MG CAPS capsule Take 1 capsule (0.4 mg total) by mouth daily. 09/17/17  Yes Shah, Pratik D, DO  cyclobenzaprine (FLEXERIL) 10 MG tablet Take 10  mg by mouth 3 (three) times daily.    [provider]  oxyCODONE-acetaminophen (PERCOCET/ROXICET) 5-325 MG tablet Take 0.5-1 tablets by mouth 4 (four) times daily as needed for moderate pain. Patient not taking: Reported on 10/07/2017 09/16/17    Lark D, DO  QUEtiapine (SEROQUEL) 25 MG tablet Take 1 tablet (25 mg total) by mouth daily. Patient not taking: Reported on 10/07/2017 09/17/17    Lark D, DO  QUEtiapine (SEROQUEL) 50 MG tablet Take  1 tablet (50 mg total) by mouth daily at 6 PM. Patient not taking: Reported on 10/07/2017 09/16/17    Lark D, DO  traMADol (ULTRAM) 50 MG tablet Take 1 tablet (50 mg total) by mouth every 6 (six) hours as needed for moderate pain or severe pain. Patient not taking: Reported on 10/07/2017 09/16/17    Lark D, DO     Family History  Problem Relation Age of Onset  . Hypertension Other     Social History   Socioeconomic History  . Marital status: Married    Spouse name: Not on file  . Number of children: Not on file  . Years of education: Not on file  . Highest education level: Not on file  Social Needs  . Financial resource strain: Not on file  . Food insecurity - worry: Not on file  . Food insecurity - inability: Not on file  . Transportation needs - medical: Not on file  . Transportation needs - non-medical: Not on file  Occupational History  . Occupation: retired  Tobacco Use  . Smoking status: Former Smoker    Last attempt to quit: 11/30/1974    Years since quitting: 42.8  . Smokeless tobacco: Never Used  Substance and Sexual Activity  . Alcohol use: Yes    Alcohol/week: 12.6 oz    Types: 21 Shots of liquor per week  . Drug use: No  . Sexual activity: Yes  Other Topics Concern  . Not on file  Social History Narrative   Regular exercise-yes     Review of Systems: A 12 point ROS discussed and pertinent positives are indicated in the HPI above.  All other systems are negative.  Review of Systems  Vital Signs: BP (!) 141/84   Pulse 91   Temp 98.4 F (36.9 C) (Oral)   Resp 14   Ht 6' (1.829 m)   Wt 220 lb (99.8 kg)   SpO2 93%   BMI 29.84 kg/m   Physical Exam  Constitutional: He is oriented to person, place, and time. He appears well-developed and well-nourished. No distress.  HENT:  Head: Normocephalic and atraumatic.  Eyes: No scleral icterus.  Cardiovascular: Normal rate.  Pulmonary/Chest: Effort normal.  Abdominal: Soft. He exhibits no  distension. There is no tenderness.  Musculoskeletal: He exhibits no tenderness.  Neurological: He is alert and oriented to person, place, and time.  Skin: Skin is warm and dry.  Nursing note and vitals reviewed.    Imaging: Dg Chest Port 1 View  Result Date: 09/15/2017 CLINICAL DATA:  Exertional shortness of breath. History of aortic valve disorder, chronic renal insufficiency, former smoker. EXAM: PORTABLE CHEST 1 VIEW COMPARISON:  Chest x-ray of August 20, 2017 FINDINGS: The lungs are well-expanded and clear. The heart and pulmonary vascularity are normal. The mediastinum is normal in width. There is mild tortuosity of the ascending and descending thoracic aorta. The bony thorax exhibits no acute abnormality. IMPRESSION: There is no acute cardiopulmonary abnormality. Electronically Signed   By: David  [place]ique  M.D.   On: 09/15/2017 10:31   Ir Kypho Lumbar Inc Fx Reduce Bone Bx Uni/bil Cannulation Inc/imaging  Result Date: 09/14/2017 CLINICAL DATA:  76 year old male with acute L2 compression fracture which has not been responsive to conservative management. EXAM: FLUOROSCOPIC GUIDED KYPHOPLASTY OF THE L2 VERTEBRAL BODY COMPARISON:  MRI lumbar spine 09/06/2017 MEDICATIONS: As antibiotic prophylaxis, was ordered pre-procedure and administered intravenously within 1 hour of incision. ANESTHESIA/SEDATION: Moderate (conscious) sedation was employed during this procedure. A total of Versed 2.5 mg and Fentanyl 125 mcg was administered intravenously. Moderate Sedation Time: 30 minutes. The patient's level of consciousness and vital signs were monitored continuously by radiology nursing throughout the procedure under my direct supervision. FLUOROSCOPY TIME:  7 minutes, 42 seconds (993 mGy) COMPLICATIONS: None immediate. TECHNIQUE: The procedure, risks (including but not limited to bleeding, infection, organ damage), benefits, and alternatives were explained to the patient. Questions regarding the  procedure were encouraged and answered. The patient understands and consents to the procedure. The patient was placed prone on the fluoroscopic table. The skin overlying the upper thoracic region was then prepped and draped in the usual sterile fashion. Maximal barrier sterile technique was utilized including caps, mask, sterile gowns, sterile gloves, sterile drape, hand hygiene and skin antiseptic. Intravenous Fentanyl and Versed were administered as conscious sedation during continuous cardiorespiratory monitoring by the radiology RN. The left pedicle at L2 was then infiltrated with 1% lidocaine followed by the advancement of a Kyphon trocar needle through the left pedicle into the posterior one-third of the vertebral body. Subsequently, the osteo drill was advanced to the anterior third of the vertebral body. The osteo drill was retracted. Through the working cannula, a Kyphon inflatable bone tamp 15 x 3 was advanced and positioned with the distal marker approximately 5 mm from the anterior aspect of the cortex. Appropriate positioning was confirmed on the AP projection. At this time, the balloon was expanded using contrast via a Kyphon inflation syringe device via micro tubing. In similar fashion, the right L2 pedicle was infiltrated with 1% lidocaine followed by the advancement of a second Kyphon trocar needle through the right pedicle into the posterior third of the vertebral body. Subsequently, the osteo drill was coaxially advanced to the anterior right third. The osteo drill was exchanged for a Kyphon inflatable bone tamp 15 x 3, advanced to the 5 mm of the anterior aspect of the cortex. The balloon was then expanded using contrast as above. Inflations were continued until there was near apposition with the superior end plate. At this time, methylmethacrylate mixture was reconstituted in the Kyphon bone mixing device system. This was then loaded into the delivery mechanism, attached to Kyphon bone fillers.  The balloons were deflated and removed followed by the instillation of methylmethacrylate mixture with excellent filling in the AP and lateral projections. No extravasation was noted in the disk spaces or posteriorly into the spinal canal. No epidural venous contamination was seen. The working cannulae and the bone filler were then retrieved and removed. Hemostasis was achieved with manual compression. The patient tolerated the procedure well without immediate postprocedural complication. IMPRESSION: 1. Technically successful L2 vertebral body augmentation using balloon kyphoplasty. 2. Per CMS PQRS reporting requirements (PQRS Measure 24): Given the patient's age of greater than 51 and the fracture site (hip, distal radius, or spine), the patient should be tested for osteoporosis using DXA, and the appropriate treatment considered based on the DXA results. Signed, Criselda Peaches, MD [facility] Radiology Electronically  Signed   By: Jacqulynn Cadet M.D.   On: 09/14/2017 15:20    Labs:  CBC: Recent Labs    09/12/17 0456 09/13/17 0512 09/14/17 0453 09/16/17 0510  WBC 8.8 10.0 10.5 10.3  HGB 10.5* 11.5* 11.8* 11.1*  HCT 31.1* 33.6* 35.0* 33.5*  PLT 309 307 333 340    COAGS: Recent Labs    09/14/17 0453  INR 1.04    BMP: Recent Labs    09/12/17 0456 09/13/17 0512 09/14/17 0453 09/16/17 0510  NA 140 137 141 140  K 3.1* 3.4* 3.8 3.5  CL 106 101 106 103  CO2 27 26 25 27   GLUCOSE 113* 115* 120* 129*  BUN 21* 16 18 20   CALCIUM 8.8* 8.8* 9.0 9.0  CREATININE 1.07 1.01 1.09 1.07  GFRNONAA >60 >60 >60 >60  GFRAA >60 >60 >60 >60    LIVER FUNCTION TESTS: Recent Labs    08/20/17 2255 09/05/17 1124 09/08/17 0432 09/09/17 0329  BILITOT 0.8 0.8 0.8 0.7  AST 26 25 25 30   ALT 24 23 21 27   ALKPHOS 49 118 116 116  PROT 6.4* 7.1 6.8 6.7  ALBUMIN 3.8 3.9 3.6 3.7    TUMOR MARKERS: No results for input(s): AFPTM, CEA, CA199,  CHROMGRNA in the last 8760 hours.  Assessment and Plan:  76 year old male with persistent lumbar spine pain despite cement augmentation and bilateral lower extremity radiculopathy worse on the left than the right.  Based on my physical examination, I do not think that his fracture is causing him pain. He has no pain on palpation. His symptoms are suggestive of neural impingement.  1.) MRI lumbar spine to assess for new/occult fracture as well as to assess for inflammation and neural impingement.   2.) If there is no new treatable fracture, then I will refer him to the [facility] for epidural steroid injection.    Electronically Signed: Jacqulynn Cadet 10/07/2017, 1:02 PM   I spent a total of   25 Minutes in face to face in clinical consultation, greater than 50% of which was counseling/coordinating care for persistent low back pain with radiculopathy

## 2017-10-08 ENCOUNTER — Ambulatory Visit (HOSPITAL_COMMUNITY)
Admission: RE | Admit: 2017-10-08 | Discharge: 2017-10-08 | Disposition: A | Discharge status: Home / Self Care | Payer: Medicare Other | Source: Ambulatory Visit | Attending: Interventional Radiology | Admitting: Interventional Radiology

## 2017-10-08 ENCOUNTER — Encounter: Payer: Self-pay | Admitting: Internal Medicine

## 2017-10-08 DIAGNOSIS — M48061 Spinal stenosis, lumbar region without neurogenic claudication: Secondary | ICD-10-CM | POA: Insufficient documentation

## 2017-10-08 DIAGNOSIS — M4856XA Collapsed vertebra, not elsewhere classified, lumbar region, initial encounter for fracture: Secondary | ICD-10-CM | POA: Diagnosis not present

## 2017-10-08 DIAGNOSIS — M545 Low back pain, unspecified: Secondary | ICD-10-CM

## 2017-10-08 DIAGNOSIS — M5126 Other intervertebral disc displacement, lumbar region: Secondary | ICD-10-CM | POA: Diagnosis not present

## 2017-10-11 ENCOUNTER — Telehealth: Payer: Self-pay | Admitting: Internal Medicine

## 2017-10-11 NOTE — Telephone Encounter (Signed)
Called stating her RN will be going out om 11/14 to start home health services

## 2017-10-12 ENCOUNTER — Telehealth: Payer: Self-pay | Admitting: Internal Medicine

## 2017-10-12 DIAGNOSIS — M5440 Lumbago with sciatica, unspecified side: Secondary | ICD-10-CM

## 2017-10-12 NOTE — Telephone Encounter (Signed)
Henri from [facility] called asking to speak with you regarding this pt. She can be reached at [phone].

## 2017-10-13 NOTE — Telephone Encounter (Signed)
Patient is calling to follow up on this. Please advise, He would like a call back to his wife.

## 2017-10-13 NOTE — Telephone Encounter (Signed)
Forwarding msg to MD assistant to contact pt ../lmb  

## 2017-10-13 NOTE — Telephone Encounter (Signed)
TJ-I spoke with Henri  at [facility] (assistnat to Dr. Laurence Ferrari Interventional Radiologist) who states that they are in need of an order for "Epidural Steroid Injection" to relieve mutual patient's pain/The "[facility]" makes it to where they are unable to order it themselves/MRI performed and negative for any new Fx/he is post Kyphoplasty on 10.16.2018/Neurologist is not responding/He is unable to take Opioids due to psychosis related reaction when he went to the hospital for pain/Plz advise if can create order for the Epidural Steroid Injection to manage his pain/this order can be faxed Attn: Henri @ [phone]/her phone is [phone]/thx dmf

## 2017-10-13 NOTE — Telephone Encounter (Signed)
Sharyn Lull found order code  Order has been entered and ready for cosign.

## 2017-10-14 ENCOUNTER — Ambulatory Visit
Admission: RE | Admit: 2017-10-14 | Discharge: 2017-10-14 | Disposition: A | Discharge status: Home / Self Care | Payer: Medicare Other | Source: Ambulatory Visit | Attending: Internal Medicine | Admitting: Internal Medicine

## 2017-10-14 DIAGNOSIS — M5440 Lumbago with sciatica, unspecified side: Secondary | ICD-10-CM

## 2017-10-14 DIAGNOSIS — M48061 Spinal stenosis, lumbar region without neurogenic claudication: Secondary | ICD-10-CM | POA: Diagnosis not present

## 2017-10-14 MED ORDER — METHYLPREDNISOLONE ACETATE 40 MG/ML INJ SUSP (RADIOLOG
120.0000 mg | Freq: Once | INTRAMUSCULAR | Status: AC
  Administered 2017-10-14: 120 mg via EPIDURAL

## 2017-10-14 MED ORDER — IOPAMIDOL (ISOVUE-M 200) INJECTION 41%
1.0000 mL | Freq: Once | INTRAMUSCULAR | Status: AC
  Administered 2017-10-14: 1 mL via EPIDURAL

## 2017-10-14 NOTE — Discharge Instructions (Signed)

## 2017-10-19 ENCOUNTER — Telehealth: Payer: Self-pay | Admitting: Internal Medicine

## 2017-10-19 NOTE — Telephone Encounter (Signed)
Starting home health services this Friday 10/22/17

## 2017-10-20 ENCOUNTER — Encounter: Payer: Self-pay | Admitting: Interventional Radiology

## 2017-10-20 ENCOUNTER — Other Ambulatory Visit: Payer: Medicare Other

## 2017-10-22 DIAGNOSIS — G8929 Other chronic pain: Secondary | ICD-10-CM | POA: Diagnosis not present

## 2017-10-22 DIAGNOSIS — E1122 Type 2 diabetes mellitus with diabetic chronic kidney disease: Secondary | ICD-10-CM | POA: Diagnosis not present

## 2017-10-22 DIAGNOSIS — I129 Hypertensive chronic kidney disease with stage 1 through stage 4 chronic kidney disease, or unspecified chronic kidney disease: Secondary | ICD-10-CM | POA: Diagnosis not present

## 2017-10-22 DIAGNOSIS — S32020D Wedge compression fracture of second lumbar vertebra, subsequent encounter for fracture with routine healing: Secondary | ICD-10-CM | POA: Diagnosis not present

## 2017-10-22 DIAGNOSIS — N183 Chronic kidney disease, stage 3 (moderate): Secondary | ICD-10-CM | POA: Diagnosis not present

## 2017-10-22 DIAGNOSIS — E1140 Type 2 diabetes mellitus with diabetic neuropathy, unspecified: Secondary | ICD-10-CM | POA: Diagnosis not present

## 2017-10-25 ENCOUNTER — Telehealth: Payer: Self-pay | Admitting: Internal Medicine

## 2017-10-25 NOTE — Telephone Encounter (Signed)
Verbal okay given for nursing as requested.

## 2017-10-25 NOTE — Telephone Encounter (Signed)
Ryan Duke from [place] at [facility] called requesting verbal orders for [name] [facility] 3 times a week for one week and 2 times a week for three weeks.  Message can be left on voicemail.

## 2017-10-25 NOTE — Telephone Encounter (Signed)
Verbal Orders for OT   2 x7   [phone] - [place]  OK to LVM

## 2017-10-25 NOTE — Telephone Encounter (Signed)
yes

## 2017-10-25 NOTE — Telephone Encounter (Signed)
Estill Bamberg from [facility] needs verbals for Carter Kitten Please call back  [phone]

## 2017-10-26 NOTE — Telephone Encounter (Signed)
Left [place] a detailed message giving okay for OT as requested.

## 2017-10-26 NOTE — Telephone Encounter (Signed)
Left detailed message giving verbal okay for PT [facility] as requested.

## 2017-11-01 ENCOUNTER — Ambulatory Visit: Payer: Medicare Other | Admitting: Internal Medicine

## 2017-11-01 ENCOUNTER — Encounter: Payer: Self-pay | Admitting: Internal Medicine

## 2017-11-01 ENCOUNTER — Other Ambulatory Visit (INDEPENDENT_AMBULATORY_CARE_PROVIDER_SITE_OTHER): Payer: Medicare Other

## 2017-11-01 VITALS — BP 110/60 | HR 68 | Temp 97.9°F | Resp 16 | Ht 71.0 in | Wt 206.0 lb

## 2017-11-01 DIAGNOSIS — N183 Chronic kidney disease, stage 3 unspecified: Secondary | ICD-10-CM

## 2017-11-01 DIAGNOSIS — D539 Nutritional anemia, unspecified: Secondary | ICD-10-CM | POA: Diagnosis not present

## 2017-11-01 DIAGNOSIS — Z794 Long term (current) use of insulin: Secondary | ICD-10-CM

## 2017-11-01 DIAGNOSIS — F32A Depression, unspecified: Secondary | ICD-10-CM

## 2017-11-01 DIAGNOSIS — E118 Type 2 diabetes mellitus with unspecified complications: Secondary | ICD-10-CM | POA: Diagnosis not present

## 2017-11-01 DIAGNOSIS — F329 Major depressive disorder, single episode, unspecified: Secondary | ICD-10-CM

## 2017-11-01 DIAGNOSIS — M5440 Lumbago with sciatica, unspecified side: Secondary | ICD-10-CM

## 2017-11-01 DIAGNOSIS — S32000G Wedge compression fracture of unspecified lumbar vertebra, subsequent encounter for fracture with delayed healing: Secondary | ICD-10-CM | POA: Diagnosis not present

## 2017-11-01 LAB — CBC WITH DIFFERENTIAL/PLATELET
BASOS ABS: 0.1 10*3/uL (ref 0.0–0.1)
Basophils Relative: 0.9 % (ref 0.0–3.0)
EOS PCT: 3 % (ref 0.0–5.0)
Eosinophils Absolute: 0.3 10*3/uL (ref 0.0–0.7)
HCT: 36.1 % — ABNORMAL LOW (ref 39.0–52.0)
Hemoglobin: 11.7 g/dL — ABNORMAL LOW (ref 13.0–17.0)
Lymphocytes Relative: 23.7 % (ref 12.0–46.0)
Lymphs Abs: 2 10*3/uL (ref 0.7–4.0)
MCHC: 32.4 g/dL (ref 30.0–36.0)
MCV: 89.3 fl (ref 78.0–100.0)
MONO ABS: 0.7 10*3/uL (ref 0.1–1.0)
MONOS PCT: 8.1 % (ref 3.0–12.0)
NEUTROS PCT: 64.3 % (ref 43.0–77.0)
Neutro Abs: 5.4 10*3/uL (ref 1.4–7.7)
PLATELETS: 328 10*3/uL (ref 150.0–400.0)
RBC: 4.04 Mil/uL — ABNORMAL LOW (ref 4.22–5.81)
RDW: 16.2 % — AB (ref 11.5–15.5)
WBC: 8.5 10*3/uL (ref 4.0–10.5)

## 2017-11-01 LAB — POCT GLYCOSYLATED HEMOGLOBIN (HGB A1C): Hemoglobin A1C: 6.2

## 2017-11-01 LAB — FERRITIN: FERRITIN: 361 ng/mL — AB (ref 22.0–322.0)

## 2017-11-01 MED ORDER — DULOXETINE HCL 30 MG PO CPEP: 30.0000 mg | ORAL_CAPSULE | Freq: Every day | ORAL | 0 refills | Status: DC

## 2017-11-01 MED ORDER — HYDROCODONE-ACETAMINOPHEN 5-325 MG PO TABS: 2.0000 | ORAL_TABLET | Freq: Four times a day (QID) | ORAL | 0 refills | Status: DC | PRN

## 2017-11-01 NOTE — Patient Instructions (Signed)
Anemia, Nonspecific Anemia is a condition in which the concentration of red blood cells or hemoglobin in the blood is below normal. Hemoglobin is a substance in red blood cells that carries oxygen to the tissues of the body. Anemia results in not enough oxygen reaching these tissues. What are the causes? Common causes of anemia include:  Excessive bleeding. Bleeding may be internal or external. This includes excessive bleeding from periods (in women) or from the intestine.  Poor nutrition.  Chronic kidney, thyroid, and liver disease.  Bone marrow disorders that decrease red blood cell production.  Cancer and treatments for cancer.  HIV, AIDS, and their treatments.  Spleen problems that increase red blood cell destruction.  Blood disorders.  Excess destruction of red blood cells due to infection, medicines, and autoimmune disorders. What are the signs or symptoms?  Minor weakness.  Dizziness.  Headache.  Palpitations.  Shortness of breath, especially with exercise.  Paleness.  Cold sensitivity.  Indigestion.  Nausea.  Difficulty sleeping.  Difficulty concentrating. Symptoms may occur suddenly or they may develop slowly. How is this diagnosed? Additional blood tests are often needed. These help your health care provider determine the best treatment. Your health care provider will check your stool for blood and look for other causes of blood loss. How is this treated? Treatment varies depending on the cause of the anemia. Treatment can include:  Supplements of iron, vitamin B12, or folic acid.  Hormone medicines.  A blood transfusion. This may be needed if blood loss is severe.  Hospitalization. This may be needed if there is significant continual blood loss.  Dietary changes.  Spleen removal. Follow these instructions at home: Keep all follow-up appointments. It often takes many weeks to correct anemia, and having your health care provider check on your  condition and your response to treatment is very important. Get help right away if:  You develop extreme weakness, shortness of breath, or chest pain.  You become dizzy or have trouble concentrating.  You develop heavy vaginal bleeding.  You develop a rash.  You have bloody or black, tarry stools.  You faint.  You vomit up blood.  You vomit repeatedly.  You have abdominal pain.  You have a fever or persistent symptoms for more than 2-3 days.  You have a fever and your symptoms suddenly get worse.  You are dehydrated. This information is not intended to replace advice given to you by your health care provider. Make sure you discuss any questions you have with your health care provider. Document Released: 12/24/2004 Document Revised: 04/29/2016 Document Reviewed: 05/12/2013 Elsevier Interactive Patient Education  2017 Elsevier Inc.  

## 2017-11-01 NOTE — Progress Notes (Signed)
Subjective:  Patient ID: Ryan Duke, male    DOB: Apr 29, 1941  Age: 76 y.o. MRN: [id number]  CC: Anemia; Back Pain; and Depression   HPI Ryan Duke presents for f/up - He fell 2 months ago and injured himself which resulted in a lumbar compression fraction.  His recovery from that was complicated and he ended up being admitted and being transferred to rehab.  His back pain is improving but he has persistent back pain.  He requests a refill on Norco to control the pain.  The pain is exacerbated with movement and standing and sometimes radiates into the front of his thighs.  He denies numbness, weakness, tingling in his lower extremities and he has had no episodes of bowel or bladder incontinence or retention.  He recently underwent kyphoplasty and that has helped.  He has lost quite a bit of weight over the last few weeks and tells me his blood sugars have been very well controlled.  He complains of sadness, crying spells, feeling worthless, hopeless and helpless.  He wants to start an antidepressant.  He denies SI or HI.  Outpatient Medications Prior to Visit  Medication Sig Dispense Refill  . insulin glargine (LANTUS) 100 UNIT/ML injection Inject 0.15 mLs (15 Units total) into the skin at bedtime. 10 mL 11  . losartan (COZAAR) 100 MG tablet Take 100 mg by mouth daily.    . methocarbamol (ROBAXIN) 500 MG tablet Take 1 tablet (500 mg total) by mouth 3 (three) times daily. 20 tablet 0  . pravastatin (PRAVACHOL) 40 MG tablet TAKE 1 TABLET ONCE DAILY. 90 tablet 3  . senna (SENOKOT) 8.6 MG TABS tablet Take 1 tablet (8.6 mg total) by mouth at bedtime as needed for mild constipation. 120 each 0  . tamsulosin (FLOMAX) 0.4 MG CAPS capsule Take 1 capsule (0.4 mg total) by mouth daily. 30 capsule 0  . HYDROcodone-acetaminophen (NORCO/VICODIN) 5-325 MG tablet Take 1-2 tablets by mouth every 6 (six) hours as needed. 15 tablet 0  . QUEtiapine (SEROQUEL) 50 MG tablet Take 1 tablet (50 mg total) by  mouth daily at 6 PM. 30 tablet 0  . sitaGLIPtin (JANUVIA) 100 MG tablet Take 100 mg by mouth daily.    Marland Kitchen atenolol (TENORMIN) 50 MG tablet Take 1 tablet (50 mg total) by mouth daily. (Patient not taking: Reported on 11/01/2017) 30 tablet 0  . cyclobenzaprine (FLEXERIL) 10 MG tablet Take 10 mg by mouth 3 (three) times daily.    . fentaNYL (DURAGESIC - DOSED MCG/HR) 50 MCG/HR Place 1 patch (50 mcg total) onto the skin every 3 (three) days. (Patient not taking: Reported on 11/01/2017) 5 patch 0  . fentaNYL (DURAGESIC - DOSED MCG/HR) 50 MCG/HR Place 1 patch (50 mcg total) onto the skin every 3 (three) days. (Patient not taking: Reported on 11/01/2017) 5 patch 0  . ondansetron (ZOFRAN ODT) 4 MG disintegrating tablet Take 1 tablet (4 mg total) by mouth every 8 (eight) hours as needed for nausea or vomiting. (Patient not taking: Reported on 11/01/2017) 20 tablet 0  . oxyCODONE-acetaminophen (PERCOCET/ROXICET) 5-325 MG tablet Take 0.5-1 tablets by mouth 4 (four) times daily as needed for moderate pain. (Patient not taking: Reported on 10/07/2017) 30 tablet 0  . QUEtiapine (SEROQUEL) 25 MG tablet Take 1 tablet (25 mg total) by mouth daily. (Patient not taking: Reported on 10/07/2017) 30 tablet 0  . traMADol (ULTRAM) 50 MG tablet Take 1 tablet (50 mg total) by mouth every 6 (six) hours as needed  for moderate pain or severe pain. (Patient not taking: Reported on 10/07/2017) 30 tablet 0   No facility-administered medications prior to visit.     ROS Review of Systems  Constitutional: Positive for activity change, appetite change, fatigue and unexpected weight change. Negative for chills, diaphoresis and fever.  HENT: Negative.   Eyes: Negative.  Negative for visual disturbance.  Respiratory: Negative for cough, chest tightness, shortness of breath and wheezing.   Cardiovascular: Negative for chest pain, palpitations and leg swelling.  Gastrointestinal: Negative for abdominal pain, constipation, diarrhea, nausea and  vomiting.  Musculoskeletal: Positive for back pain. Negative for arthralgias, gait problem, myalgias and neck pain.  Skin: Negative.   Neurological: Negative.  Negative for dizziness, weakness, numbness and headaches.  Hematological: Negative for adenopathy. Does not bruise/bleed easily.  Psychiatric/Behavioral: Positive for dysphoric mood. Negative for agitation, behavioral problems, confusion, decreased concentration, self-injury, sleep disturbance and suicidal ideas. The patient is nervous/anxious.     Objective:  BP 110/60   Pulse 68   Temp 97.9 F (36.6 C)   Resp 16   Ht 5\' 11"  (1.803 m)   Wt 206 lb (93.4 kg)   SpO2 99%   BMI 28.73 kg/m   BP Readings from Last 3 Encounters:  11/01/17 110/60  10/14/17 (!) 157/86  10/07/17 (!) 141/84    Wt Readings from Last 3 Encounters:  11/01/17 206 lb (93.4 kg)  10/07/17 220 lb (99.8 kg)  09/10/17 222 lb (100.7 kg)    Physical Exam  Constitutional: He is oriented to person, place, and time. No distress.  HENT:  Mouth/Throat: Oropharynx is clear and moist. No oropharyngeal exudate.  Eyes: Conjunctivae are normal. Right eye exhibits no discharge. Left eye exhibits no discharge. No scleral icterus.  Neck: Normal range of motion. Neck supple. No JVD present. No thyromegaly present.  Cardiovascular: Normal rate and regular rhythm.  Murmur (1/6 SEM RUSB) heard. Pulmonary/Chest: Effort normal and breath sounds normal. He has no wheezes. He has no rales.  Abdominal: Soft. Bowel sounds are normal. He exhibits no distension and no mass. There is no guarding.  Musculoskeletal: Normal range of motion. He exhibits no edema, tenderness or deformity.  Lymphadenopathy:    He has no cervical adenopathy.  Neurological: He is alert and oriented to person, place, and time. He has normal strength. He displays no atrophy and no tremor. No cranial nerve deficit or sensory deficit. He exhibits normal muscle tone. He displays no seizure activity.  Neg SLR  in BLE  Skin: Skin is warm and dry. No rash noted. He is not diaphoretic. No erythema. No pallor.  Vitals reviewed.   Lab Results  Component Value Date   WBC 8.5 11/01/2017   HGB 11.7 (L) 11/01/2017   HCT 36.1 (L) 11/01/2017   PLT 328.0 11/01/2017   GLUCOSE 141 (H) 11/01/2017   CHOL 172 03/15/2017   TRIG 90.0 03/15/2017   HDL 51.20 03/15/2017   LDLDIRECT 100.0 06/13/2009   LDLCALC 102 (H) 03/15/2017   ALT 27 09/09/2017   AST 30 09/09/2017   NA 140 11/01/2017   K 3.7 11/01/2017   CL 101 11/01/2017   CREATININE 1.34 11/01/2017   BUN 25 (H) 11/01/2017   CO2 27 11/01/2017   TSH 2.28 06/05/2016   PSA 1.05 10/28/2015   INR 1.04 09/14/2017   HGBA1C 6.2 11/01/2017   MICROALBUR 41.1 (H) 03/15/2017    Dg Inject Diag/thera/inc Needle/cath/plc Epi/lumb/sac W/img  Result Date: 10/14/2017 CLINICAL DATA:  Lumbosacral spondylosis without myelopathy. L2 compression  fracture with persistent low back and bilateral lower extremity pain, left greater than right, following kyphoplasty. 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 FLUOROSCOPY TIME:  Radiation Exposure Index (as provided by the fluoroscopic device): 16.60 microGray*m^2 Fluoroscopy Time (in minutes and seconds):  8 seconds PROCEDURE: The procedure, risks, benefits, and alternatives were explained to the patient. Questions regarding the procedure were encouraged and answered. The patient understands and consents to the procedure. LUMBAR EPIDURAL INJECTION: An interlaminar approach was performed on the left at L2-3. The overlying skin was cleansed and anesthetized. A 3.5 inch 20 gauge epidural needle was advanced using loss-of-resistance technique. DIAGNOSTIC EPIDURAL INJECTION: Injection of Isovue-M 200 shows a good epidural pattern with spread above and below the level of needle placement, primarily on the left. No vascular opacification is seen. THERAPEUTIC EPIDURAL INJECTION: 120 mg of Depo-Medrol mixed with 3 mL of 1% lidocaine were instilled. The  procedure was well-tolerated, and the patient was discharged thirty minutes following the injection in good condition. COMPLICATIONS: None IMPRESSION: Technically successful lumbar interlaminar epidural injection on the left at L2-3. Electronically Signed   By: Logan Bores M.D.   On: 10/14/2017 08:24    Assessment & Plan:   Oluwatomisin was seen today for anemia, back pain and depression.  Diagnoses and all orders for this visit:  Depression, acute- Will start duloxetine at 30 mg a day.  I told he and his wife that we should try to increase the dose on this over the next few months. -     DULoxetine (CYMBALTA) 30 MG capsule; Take 1 capsule (30 mg total) by mouth daily.  Closed wedge fracture of lumbar vertebra with delayed healing, unspecified lumbar vertebral level, subsequent encounter -     HYDROcodone-acetaminophen (NORCO/VICODIN) 5-325 MG tablet; Take 2 tablets by mouth every 6 (six) hours as needed.  Bilateral low back pain with sciatica, sciatica laterality unspecified, unspecified chronicity -     HYDROcodone-acetaminophen (NORCO/VICODIN) 5-325 MG tablet; Take 2 tablets by mouth every 6 (six) hours as needed.  Type 2 diabetes mellitus with complication, with long-term current use of insulin (HCC)-his A1c is down to 6.2%.  I have recommended that he stop using the basal insulin in the DPP 4 inhibitor. -     POCT glycosylated hemoglobin (Hb A1C) -     Basic metabolic panel; Future  Deficiency anemia- His H&H have improved some.  His ferritin level is elevated and his iron level is normal.  This is most consistent with the anemia of chronic disease.  I will continue to monitor this. -     CBC with Differential/Platelet; Future -     IBC panel; Future -     Ferritin; Future  Kidney disease, chronic, 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) ([place])- His renal function is stable.  He will continue to avoid nephrotoxic agents.   I have discontinued [name]. Manigo's ondansetron, cyclobenzaprine,  atenolol, fentaNYL, fentaNYL, oxyCODONE-acetaminophen, QUEtiapine, QUEtiapine, traMADol, and sitaGLIPtin. I have also changed his HYDROcodone-acetaminophen. Additionally, I am having him start on DULoxetine. Lastly, I am having him maintain his pravastatin, insulin glargine, methocarbamol, senna, tamsulosin, and losartan.  Meds ordered this encounter  Medications  . HYDROcodone-acetaminophen (NORCO/VICODIN) 5-325 MG tablet    Sig: Take 2 tablets by mouth every 6 (six) hours as needed.    Dispense:  240 tablet    Refill:  0  . DULoxetine (CYMBALTA) 30 MG capsule    Sig: Take 1 capsule (30 mg total) by mouth daily.    Dispense:  90 capsule    Refill:  0     Follow-up: Return in about 3 months (around 01/30/2018).  [name], MD

## 2017-11-02 LAB — BASIC METABOLIC PANEL
BUN: 25 mg/dL — AB (ref 6–23)
CHLORIDE: 101 meq/L (ref 96–112)
CO2: 27 mEq/L (ref 19–32)
Calcium: 9.7 mg/dL (ref 8.4–10.5)
Creatinine, Ser: 1.34 mg/dL (ref 0.40–1.50)
GFR: 54.7 mL/min — ABNORMAL LOW (ref >60.00)
GLUCOSE: 141 mg/dL — AB (ref 70–99)
POTASSIUM: 3.7 meq/L (ref 3.5–5.1)
SODIUM: 140 meq/L (ref 135–145)

## 2017-11-02 LAB — IBC PANEL
Iron: 59 ug/dL (ref 42–165)
SATURATION RATIOS: 19.7 % — AB (ref 20.0–50.0)
TRANSFERRIN: 214 mg/dL (ref 212.0–360.0)

## 2017-11-03 ENCOUNTER — Encounter: Payer: Self-pay | Admitting: Internal Medicine

## 2017-11-04 ENCOUNTER — Other Ambulatory Visit: Payer: Self-pay | Admitting: Internal Medicine

## 2017-11-09 ENCOUNTER — Encounter: Payer: Self-pay | Admitting: Internal Medicine

## 2017-11-10 ENCOUNTER — Other Ambulatory Visit: Payer: Self-pay | Admitting: Neurological Surgery

## 2017-11-10 ENCOUNTER — Other Ambulatory Visit: Payer: Self-pay | Admitting: Internal Medicine

## 2017-11-10 DIAGNOSIS — S32020D Wedge compression fracture of second lumbar vertebra, subsequent encounter for fracture with routine healing: Secondary | ICD-10-CM | POA: Diagnosis not present

## 2017-11-10 DIAGNOSIS — E669 Obesity, unspecified: Secondary | ICD-10-CM | POA: Diagnosis not present

## 2017-11-10 DIAGNOSIS — W19XXXD Unspecified fall, subsequent encounter: Secondary | ICD-10-CM | POA: Diagnosis not present

## 2017-11-10 DIAGNOSIS — I129 Hypertensive chronic kidney disease with stage 1 through stage 4 chronic kidney disease, or unspecified chronic kidney disease: Secondary | ICD-10-CM | POA: Diagnosis not present

## 2017-11-10 DIAGNOSIS — Z9181 History of falling: Secondary | ICD-10-CM

## 2017-11-10 DIAGNOSIS — Z87891 Personal history of nicotine dependence: Secondary | ICD-10-CM

## 2017-11-10 DIAGNOSIS — M5440 Lumbago with sciatica, unspecified side: Secondary | ICD-10-CM

## 2017-11-10 DIAGNOSIS — Z6831 Body mass index (BMI) 31.0-31.9, adult: Secondary | ICD-10-CM

## 2017-11-10 DIAGNOSIS — M109 Gout, unspecified: Secondary | ICD-10-CM | POA: Diagnosis not present

## 2017-11-10 DIAGNOSIS — N400 Enlarged prostate without lower urinary tract symptoms: Secondary | ICD-10-CM | POA: Diagnosis not present

## 2017-11-10 DIAGNOSIS — F411 Generalized anxiety disorder: Secondary | ICD-10-CM | POA: Diagnosis not present

## 2017-11-10 DIAGNOSIS — Z794 Long term (current) use of insulin: Secondary | ICD-10-CM | POA: Diagnosis not present

## 2017-11-10 DIAGNOSIS — N183 Chronic kidney disease, stage 3 (moderate): Secondary | ICD-10-CM | POA: Diagnosis not present

## 2017-11-10 DIAGNOSIS — G8929 Other chronic pain: Secondary | ICD-10-CM | POA: Diagnosis not present

## 2017-11-10 DIAGNOSIS — S32000G Wedge compression fracture of unspecified lumbar vertebra, subsequent encounter for fracture with delayed healing: Secondary | ICD-10-CM

## 2017-11-10 DIAGNOSIS — E1140 Type 2 diabetes mellitus with diabetic neuropathy, unspecified: Secondary | ICD-10-CM | POA: Diagnosis not present

## 2017-11-10 DIAGNOSIS — E1122 Type 2 diabetes mellitus with diabetic chronic kidney disease: Secondary | ICD-10-CM | POA: Diagnosis not present

## 2017-11-17 ENCOUNTER — Other Ambulatory Visit: Payer: Medicare Other

## 2017-11-18 ENCOUNTER — Telehealth: Payer: Self-pay | Admitting: Internal Medicine

## 2017-11-18 ENCOUNTER — Ambulatory Visit
Admission: RE | Admit: 2017-11-18 | Discharge: 2017-11-18 | Disposition: A | Discharge status: Home / Self Care | Payer: Medicare Other | Source: Ambulatory Visit | Attending: Internal Medicine | Admitting: Internal Medicine

## 2017-11-18 DIAGNOSIS — M545 Low back pain: Secondary | ICD-10-CM | POA: Diagnosis not present

## 2017-11-18 DIAGNOSIS — S32000G Wedge compression fracture of unspecified lumbar vertebra, subsequent encounter for fracture with delayed healing: Secondary | ICD-10-CM

## 2017-11-18 DIAGNOSIS — M5440 Lumbago with sciatica, unspecified side: Secondary | ICD-10-CM

## 2017-11-18 MED ORDER — IOPAMIDOL (ISOVUE-M 200) INJECTION 41%
1.0000 mL | Freq: Once | INTRAMUSCULAR | Status: AC
  Administered 2017-11-18: 1 mL via EPIDURAL

## 2017-11-18 MED ORDER — METHYLPREDNISOLONE ACETATE 40 MG/ML INJ SUSP (RADIOLOG
120.0000 mg | Freq: Once | INTRAMUSCULAR | Status: AC
  Administered 2017-11-18: 120 mg via EPIDURAL

## 2017-11-18 NOTE — Discharge Instructions (Signed)

## 2017-11-18 NOTE — Telephone Encounter (Signed)
Copied from [place] [id number]. Topic: Inquiry >> Nov 18, 2017  2:54 PM Oliver Pila B wrote: Reason for CRM: Levada Dy from [facility] called to ask for extended orders for the pt for a couple of weeks, contact to advise call Levada Dy @ [phone]

## 2017-11-18 NOTE — Telephone Encounter (Signed)
Contacted Angela. Gave verbal okay for nursing to be extended for the next couple of weeks.

## 2017-11-26 ENCOUNTER — Telehealth: Payer: Self-pay | Admitting: Internal Medicine

## 2017-11-26 NOTE — Telephone Encounter (Signed)
Copied from [place] [phone]. Topic: Quick Communication - See Telephone Encounter >> Nov 26, 2017  8:34 AM Ether Griffins B wrote: CRM for notification. See Telephone encounter for:  Evelina Dun with [facility] needing PT verbal order for 1x 1 week for reassessment. Call back number [phone] 11/26/17.

## 2017-11-26 NOTE — Telephone Encounter (Signed)
Left detailed message for Cindee Salt with verbal okay for PT as requested.

## 2017-12-01 NOTE — Telephone Encounter (Signed)
Copied from [place]. Topic: General - Other >> Dec 01, 2017  3:52 PM Neva Seat wrote:   [facility]  Requesting a PT extension to 2 times a week for 3 weeks for pt.

## 2017-12-02 ENCOUNTER — Other Ambulatory Visit: Payer: Self-pay | Admitting: Internal Medicine

## 2017-12-02 DIAGNOSIS — M5440 Lumbago with sciatica, unspecified side: Secondary | ICD-10-CM

## 2017-12-02 DIAGNOSIS — S32000G Wedge compression fracture of unspecified lumbar vertebra, subsequent encounter for fracture with delayed healing: Secondary | ICD-10-CM

## 2017-12-02 MED ORDER — HYDROCODONE-ACETAMINOPHEN 10-325 MG PO TABS: 1.0000 | ORAL_TABLET | ORAL | 0 refills | Status: DC | PRN

## 2017-12-02 NOTE — Telephone Encounter (Signed)
Left detailed message for Randall Hiss with verbal okay for PT extension as requested.

## 2017-12-09 ENCOUNTER — Telehealth: Payer: Self-pay | Admitting: Internal Medicine

## 2017-12-09 ENCOUNTER — Telehealth: Payer: Self-pay | Admitting: Physician Assistant

## 2017-12-09 NOTE — Telephone Encounter (Signed)
  Mr Lomba called and c/o increasing back pain with radiation down his right leg. He states the radiation has gotten worse.  He underwent an L2 Kyphoplasty back in October by Dr. Laurence Ferrari.  He has had 2 epidural injections since that time with no relief.  He is quite frustrated.  I spoke with him and his wife today on the phone and he states he is very frustrated with his Neurosurgeon because they told him "they can't see him anymore because we worked on him".  They are inquiring about another opinion.  I reviewed his MRI and read the results over the phone to both him and his wife. They were surprised to hear that there was "more going on than just the L2 fracture".  They were both under the impression that after the L2 KP, he would be "much better".  I explained that there are other procedures that might could be done to help him, like spinal stimulator implantation, nerve ablation, etc. I did explain that we do not do those procedures here at [facility].  I have recommended they call the [facility] for another opinion. I gave his wife the number and the website. I explained to them that I myself have been a patient there as well as my husband and we had excellent results.  They were very appreciative and agreed to give them a call to arrange an evaluation.  Harshita Bernales S Delona Clasby PA-C 12/09/2017 4:22 PM

## 2017-12-09 NOTE — Telephone Encounter (Signed)
Copied from [place] [phone]. Topic: Quick Communication - See Telephone Encounter >> Dec 09, 2017 12:16 PM Vernona Rieger wrote: CRM for notification. See Telephone encounter for:   12/09/17.  [place] from [facility] needs an extension for occupational therapy for 2 times a week for 5 more weeks. Call back is [phone]

## 2017-12-10 ENCOUNTER — Encounter: Payer: Self-pay | Admitting: Internal Medicine

## 2017-12-10 NOTE — Telephone Encounter (Signed)
Left detailed message for [place] from [facility] with verbal okay for extension of OT as requested.

## 2017-12-13 ENCOUNTER — Other Ambulatory Visit: Payer: Self-pay | Admitting: Internal Medicine

## 2017-12-13 ENCOUNTER — Encounter: Payer: Self-pay | Admitting: Internal Medicine

## 2017-12-13 DIAGNOSIS — M545 Low back pain: Secondary | ICD-10-CM | POA: Diagnosis not present

## 2017-12-13 DIAGNOSIS — M48062 Spinal stenosis, lumbar region with neurogenic claudication: Secondary | ICD-10-CM | POA: Diagnosis not present

## 2017-12-13 DIAGNOSIS — F32A Depression, unspecified: Secondary | ICD-10-CM

## 2017-12-13 DIAGNOSIS — M8008XD Age-related osteoporosis with current pathological fracture, vertebra(e), subsequent encounter for fracture with routine healing: Secondary | ICD-10-CM | POA: Diagnosis not present

## 2017-12-13 DIAGNOSIS — F329 Major depressive disorder, single episode, unspecified: Secondary | ICD-10-CM

## 2017-12-13 MED ORDER — DULOXETINE HCL 60 MG PO CPEP: 60.0000 mg | ORAL_CAPSULE | Freq: Every day | ORAL | 1 refills | Status: DC

## 2017-12-14 DIAGNOSIS — M8008XD Age-related osteoporosis with current pathological fracture, vertebra(e), subsequent encounter for fracture with routine healing: Secondary | ICD-10-CM | POA: Diagnosis not present

## 2017-12-14 DIAGNOSIS — M48062 Spinal stenosis, lumbar region with neurogenic claudication: Secondary | ICD-10-CM | POA: Diagnosis not present

## 2017-12-27 DIAGNOSIS — M48062 Spinal stenosis, lumbar region with neurogenic claudication: Secondary | ICD-10-CM | POA: Diagnosis not present

## 2017-12-27 DIAGNOSIS — Z4689 Encounter for fitting and adjustment of other specified devices: Secondary | ICD-10-CM | POA: Diagnosis not present

## 2017-12-27 DIAGNOSIS — M545 Low back pain: Secondary | ICD-10-CM | POA: Diagnosis not present

## 2017-12-27 DIAGNOSIS — M8008XD Age-related osteoporosis with current pathological fracture, vertebra(e), subsequent encounter for fracture with routine healing: Secondary | ICD-10-CM | POA: Diagnosis not present

## 2017-12-28 ENCOUNTER — Other Ambulatory Visit: Payer: Self-pay | Admitting: Internal Medicine

## 2017-12-28 ENCOUNTER — Telehealth: Payer: Self-pay | Admitting: Internal Medicine

## 2017-12-28 DIAGNOSIS — I359 Nonrheumatic aortic valve disorder, unspecified: Secondary | ICD-10-CM

## 2017-12-28 NOTE — Telephone Encounter (Signed)
Referral ordered

## 2017-12-28 NOTE — Telephone Encounter (Signed)
Can enter cardiology referral?

## 2017-12-28 NOTE — Telephone Encounter (Signed)
Copied from [place]. Topic: Quick Communication - See Telephone Encounter >> Dec 28, 2017  1:24 PM Boyd Kerbs wrote: CRM for notification. See Telephone encounter for:    Pt. Calling about the Cardiologist?  His surgery is 2/14 and needs to be seen before this so can have surgery.   Wants to know if doctor is doing a referral or making him an appt.   Please call Kaj back 12/28/17.

## 2017-12-29 NOTE — Telephone Encounter (Signed)
Pt informed that referral has been entered.

## 2017-12-31 ENCOUNTER — Other Ambulatory Visit: Payer: Self-pay | Admitting: Internal Medicine

## 2017-12-31 DIAGNOSIS — S32000G Wedge compression fracture of unspecified lumbar vertebra, subsequent encounter for fracture with delayed healing: Secondary | ICD-10-CM

## 2017-12-31 DIAGNOSIS — M5440 Lumbago with sciatica, unspecified side: Secondary | ICD-10-CM

## 2018-01-04 ENCOUNTER — Telehealth: Payer: Self-pay | Admitting: *Deleted

## 2018-01-04 NOTE — Telephone Encounter (Signed)
Left a message for the patient to call back.  Patient will need to have an echo and and an appointment set up with Dr. Burt Knack.

## 2018-01-05 ENCOUNTER — Other Ambulatory Visit: Payer: Self-pay

## 2018-01-05 DIAGNOSIS — I350 Nonrheumatic aortic (valve) stenosis: Secondary | ICD-10-CM

## 2018-01-05 DIAGNOSIS — I359 Nonrheumatic aortic valve disorder, unspecified: Secondary | ICD-10-CM

## 2018-01-05 NOTE — Telephone Encounter (Signed)
Patient has been set up for a ECHO on 2/8 and an appointment on 2/11 with Dr. Burt Knack. He has verbalized his understanding.

## 2018-01-07 ENCOUNTER — Other Ambulatory Visit: Payer: Self-pay

## 2018-01-07 ENCOUNTER — Ambulatory Visit (HOSPITAL_COMMUNITY): Payer: Medicare Other | Attending: Cardiovascular Disease

## 2018-01-07 DIAGNOSIS — I119 Hypertensive heart disease without heart failure: Secondary | ICD-10-CM | POA: Insufficient documentation

## 2018-01-07 DIAGNOSIS — E785 Hyperlipidemia, unspecified: Secondary | ICD-10-CM | POA: Diagnosis not present

## 2018-01-07 DIAGNOSIS — I359 Nonrheumatic aortic valve disorder, unspecified: Secondary | ICD-10-CM | POA: Diagnosis not present

## 2018-01-07 DIAGNOSIS — I080 Rheumatic disorders of both mitral and aortic valves: Secondary | ICD-10-CM | POA: Insufficient documentation

## 2018-01-07 DIAGNOSIS — E669 Obesity, unspecified: Secondary | ICD-10-CM | POA: Insufficient documentation

## 2018-01-07 DIAGNOSIS — I350 Nonrheumatic aortic (valve) stenosis: Secondary | ICD-10-CM

## 2018-01-07 DIAGNOSIS — Z6828 Body mass index (BMI) 28.0-28.9, adult: Secondary | ICD-10-CM | POA: Diagnosis not present

## 2018-01-07 DIAGNOSIS — E119 Type 2 diabetes mellitus without complications: Secondary | ICD-10-CM | POA: Insufficient documentation

## 2018-01-10 ENCOUNTER — Ambulatory Visit: Payer: Medicare Other | Admitting: Cardiovascular Disease

## 2018-01-10 ENCOUNTER — Encounter: Payer: Self-pay | Admitting: Cardiovascular Disease

## 2018-01-10 VITALS — BP 132/78 | HR 64 | Ht 71.0 in | Wt 203.8 lb

## 2018-01-10 DIAGNOSIS — I359 Nonrheumatic aortic valve disorder, unspecified: Secondary | ICD-10-CM | POA: Diagnosis not present

## 2018-01-10 DIAGNOSIS — I350 Nonrheumatic aortic (valve) stenosis: Secondary | ICD-10-CM

## 2018-01-10 NOTE — Progress Notes (Signed)
Cardiology Office Note Date:  01/10/2018   ID:  JEARL SOTO, DOB [birth year], MRN [id number]  PCP:  Janith Lima, MD  Cardiologist:  Sherren Mocha, MD    Chief Complaint  Patient presents with  . Back Pain     History of Present Illness: Ryan Duke is a 77 y.o. male who presents for evaluation of aortic stenosis.  The patient was initially seen in January evaluated here since that time.  He is followed for hypertension, diabetes, hyperlipidemia, and a long-standing heart murmur.  Around the time of his evaluation in 2015 he was found to have severe LVH with moderate aortic stenosis, peak and mean transaortic valve gradients of 59 and 31 mm respectively.  The patient is here with his wife today. He is scheduled for back surgery. He fell in September 2018 and has had severe back pain since that time. He has undergone injections and kyphoplasty but continues to have pain. He's now scheduled for a laminectomy with Dr Ivan Croft at [facility] on March 13th.  Prior to his back injury, he was reasonably active with grocery shopping, cooking, yard work, and was able to carry a vacuum cleaner up stairs without symptoms.  He specifically denies any symptoms of chest pain, shortness of breath, leg swelling, heart palpitations, orthopnea, or PND.  Past Medical History:  Diagnosis Date  . Diabetes mellitus    type 2  . Hyperlipidemia   . Hypertension   . Hypogonadism, male   . Pancreatitis 2008  . Renal insufficiency     Past Surgical History:  Procedure Laterality Date  . FLEXIBLE SIGMOIDOSCOPY N/A 09/10/2017   Procedure: FLEXIBLE SIGMOIDOSCOPY;  Surgeon: Mauri Pole, MD;  Location: [facility];  Service: Endoscopy;  Laterality: N/A;  . IR KYPHO LUMBAR INC FX REDUCE BONE BX UNI/BIL CANNULATION INC/IMAGING  09/14/2017  . IR RADIOLOGIST EVAL & MGMT  10/07/2017    Current Outpatient Medications  Medication Sig Dispense Refill  . atenolol-chlorthalidone  (TENORETIC) 100-25 MG tablet Take 1 tablet by mouth daily.    . DULoxetine (CYMBALTA) 60 MG capsule Take 1 capsule (60 mg total) by mouth daily. 90 capsule 1  . HYDROcodone-acetaminophen (NORCO) 10-325 MG tablet TAKE  (1)  TABLET  EVERY FOUR HOURS AS NEEDED. 120 tablet 0  . losartan (COZAAR) 100 MG tablet Take 100 mg by mouth daily.    . methocarbamol (ROBAXIN) 500 MG tablet Take 1 tablet (500 mg total) by mouth 3 (three) times daily. 20 tablet 0  . pravastatin (PRAVACHOL) 40 MG tablet TAKE 1 TABLET ONCE DAILY. 90 tablet 3  . senna (SENOKOT) 8.6 MG TABS tablet Take 1 tablet (8.6 mg total) by mouth at bedtime as needed for mild constipation. 120 each 0  . tamsulosin (FLOMAX) 0.4 MG CAPS capsule TAKE (1) CAPSULE DAILY. 90 capsule 1   No current facility-administered medications for this visit.     Allergies:   Farxiga [dapagliflozin]; Metformin and related; Atorvastatin; Benazepril hcl; Flexeril [cyclobenzaprine]; and Oxycodone   Social History:  The patient  reports that he quit smoking about 43 years ago. he has never used smokeless tobacco. He reports that he drinks about 12.6 oz of alcohol per week. He reports that he does not use drugs.   Family History:  The patient's family history includes Hypertension in his other.  No family hx of CAD or valvular disease  ROS:  Please see the history of present illness.  Otherwise, review of systems is positive for  depression, back pain, muscle pain, snoring, constipation, anxiety.  All other systems are reviewed and negative.   PHYSICAL EXAM: VS:  BP 132/78   Pulse 64   Ht 5\' 11"  (1.803 m)   Wt 203 lb 12.8 oz (92.4 kg)   BMI 28.42 kg/m  , BMI Body mass index is 28.42 kg/m. GEN: Well nourished, well developed, in no acute distress  HEENT: normal  Neck: no JVD, no masses. No carotid bruits Cardiac: RRR with 3/6 harsh mid peaking systolic murmur at the right upper sternal border, A2 is audible               Respiratory:  clear to auscultation  bilaterally, normal work of breathing GI: soft, nontender, nondistended, + BS MS: no deformity or atrophy  Ext: no pretibial edema, pedal pulses 2+= bilaterally Skin: warm and dry, no rash Neuro:  Strength and sensation are intact Psych: euthymic mood, full affect  EKG:  EKG is not ordered today. The patient's most recent EKG is reviewed and shows normal sinus rhythm with ST/T wave abnormality consider anterolateral ischemia  Recent Labs: 09/08/2017: Magnesium 2.1 09/09/2017: ALT 27 11/01/2017: BUN 25; Creatinine, Ser 1.34; Hemoglobin 11.7; Platelets 328.0; Potassium 3.7; Sodium 140   Lipid Panel     Component Value Date/Time   CHOL 172 03/15/2017 1002   TRIG 90.0 03/15/2017 1002   TRIG 171 (H) 10/06/2006 0741   HDL 51.20 03/15/2017 1002   CHOLHDL 3 03/15/2017 1002   VLDL 18.0 03/15/2017 1002   LDLCALC 102 (H) 03/15/2017 1002   LDLDIRECT 100.0 06/13/2009 0817      Wt Readings from Last 3 Encounters:  01/10/18 203 lb 12.8 oz (92.4 kg)  11/01/17 206 lb (93.4 kg)  10/07/17 220 lb (99.8 kg)     Cardiac Studies Reviewed: 2D Echo 01-07-18: Study Conclusions  - Left ventricle: The cavity size was normal. There was mild   concentric hypertrophy. Systolic function was vigorous. The   estimated ejection fraction was in the range of 65% to 70%. Wall   motion was normal; there were no regional wall motion   abnormalities. - Aortic valve: Noncoronary cusp mobility was severely restricted.   Transvalvular velocity was increased more than expected, due to   high cardiac output. There was moderate stenosis. There was   trivial regurgitation. Mean gradient (S): 34 mm Hg. Peak gradient   (S): 65 mm Hg. Peak velocity ratio of LVOT to aortic valve: 0.39. - Mitral valve: Calcified annulus. - Left atrium: The atrium was mildly dilated. - Right atrium: The atrium was mildly dilated. - Atrial septum: No defect or patent foramen ovale was identified.  Impressions:  - Compared to 2016,  aortic valve gradients are higher, but this is   due at least in part to increased stroke volume/cardiac output.   The aortic jet remains early peaking and the dimensionless   obstructive index is relatively high. Parasternal images are of   poor quality. LVOT diameter measurement is likely to be   inaccurate.  ------------------------------------------------------------------- Study data:  Comparison was made to the study of 02/25/2015.  Study status:  Routine.  Procedure:  The patient reported no pain pre or post test. Transthoracic echocardiography for left ventricular function evaluation and for assessment of valvular function. Image quality was adequate.  Study completion:  There were no complications.          Transthoracic echocardiography.  M-mode, complete 2D, spectral Doppler, and color Doppler.  Birthdate: Patient birthdate: 1941/03/22.  Age:  Patient is 77 yr old.  Sex: Gender: male.    BMI: 28.7 kg/m^2.  Blood pressure:     110/60 Patient status:  Outpatient.  Study date:  Study date: 01/07/2018. Study time: 04:18 PM.  Location:  [place] [facility]  -------------------------------------------------------------------  ------------------------------------------------------------------- Left ventricle:  The cavity size was normal. There was mild concentric hypertrophy. Systolic function was vigorous. The estimated ejection fraction was in the range of 65% to 70%. Wall motion was normal; there were no regional wall motion abnormalities.  ------------------------------------------------------------------- Aortic valve:   Trileaflet; moderately thickened, moderately calcified leaflets.  Noncoronary cusp mobility was severely restricted.  Doppler:  Transvalvular velocity was increased more than expected, due to high cardiac output. There was moderate stenosis. There was trivial regurgitation.    VTI ratio of LVOT to aortic valve: 0.46. Valve area (VTI): 1.58 cm^2. Indexed  valve area (VTI): 0.72 cm^2/m^2. Peak velocity ratio of LVOT to aortic valve: 0.39. Valve area (Vmax): 1.34 cm^2. Indexed valve area (Vmax): 0.61 cm^2/m^2. Mean velocity ratio of LVOT to aortic valve: 0.4. Valve area (Vmean): 1.38 cm^2. Indexed valve area (Vmean): 0.63 cm^2/m^2.    Mean gradient (S): 34 mm Hg. Peak gradient (S): 65 mm Hg.  ------------------------------------------------------------------- Aorta:  Aortic root: The aortic root was normal in size. Ascending aorta: The ascending aorta was normal in size.  ------------------------------------------------------------------- Mitral valve:   Calcified annulus. Leaflet separation was normal. Doppler:  Transvalvular velocity was within the normal range. There was no evidence for stenosis. There was no regurgitation.  ------------------------------------------------------------------- Left atrium:  The atrium was mildly dilated.  ------------------------------------------------------------------- Atrial septum:  No defect or patent foramen ovale was identified.   ------------------------------------------------------------------- Right ventricle:  The cavity size was normal. Systolic function was normal.  ------------------------------------------------------------------- Pulmonic valve:   Poorly visualized.  Doppler:  There was trivial regurgitation.  ------------------------------------------------------------------- Tricuspid valve:  Poorly visualized.  Structurally normal valve. Leaflet separation was normal.  Doppler:  Transvalvular velocity was within the normal range. There was no regurgitation.  ------------------------------------------------------------------- Pulmonary artery:    Systolic pressure could not be accurately estimated.  ------------------------------------------------------------------- Right atrium:  The atrium was mildly  dilated.  ------------------------------------------------------------------- Pericardium:  There was no pericardial effusion.  ------------------------------------------------------------------- Systemic veins: Inferior vena cava: The vessel was normal in size. The respirophasic diameter changes were in the normal range (>= 50%), consistent with normal central venous pressure.  ------------------------------------------------------------------- Measurements   Left ventricle                            Value          Reference  LV ID, ED, PLAX chordal           (H)     54.66 mm       43 - 52  LV ID, ES, PLAX chordal                   33.67 mm       23 - 38  LV fx shortening, PLAX chordal            38    %        >=29  LV PW thickness, ED                       12.92 mm       ---------  IVS/LV PW ratio, ED  1.03           <=1.3  Stroke volume, 2D                         124   ml       ---------  Stroke volume/bsa, 2D                     57    ml/m^2   ---------    Ventricular septum                        Value          Reference  IVS thickness, ED                         13.3  mm       ---------    LVOT                                      Value          Reference  LVOT ID, S                                21    mm       ---------  LVOT area                                 3.46  cm^2     ---------  LVOT ID                                   21    mm       ---------  LVOT peak velocity, S                     156   cm/s     ---------  LVOT mean velocity, S                     106   cm/s     ---------  LVOT VTI, S                               35.8  cm       ---------  LVOT peak gradient, S                     10    mm Hg    ---------  Stroke volume (SV), LVOT DP               124   ml       ---------  Stroke index (SV/bsa), LVOT DP            56.8  ml/m^2   ---------    Aortic valve                              Value          Reference  Aortic valve  peak  velocity, S             403   cm/s     ---------  Aortic valve mean velocity, S             266   cm/s     ---------  Aortic valve VTI, S                       78.5  cm       ---------  Aortic mean gradient, S                   34    mm Hg    ---------  Aortic peak gradient, S                   65    mm Hg    ---------  VTI ratio, LVOT/AV                        0.46           ---------  Aortic valve area, VTI                    1.58  cm^2     ---------  Aortic valve area/bsa, VTI                0.72  cm^2/m^2 ---------  Velocity ratio, peak, LVOT/AV             0.39           ---------  Aortic valve area, peak velocity          1.34  cm^2     ---------  Aortic valve area/bsa, peak               0.61  cm^2/m^2 ---------  velocity  Velocity ratio, mean, LVOT/AV             0.4            ---------  Aortic valve area, mean velocity          1.38  cm^2     ---------  Aortic valve area/bsa, mean               0.63  cm^2/m^2 ---------  velocity  Aortic regurg pressure half-time          547   ms       ---------    Aorta                                     Value          Reference  Aortic root ID, ED                        40    mm       ---------  Ascending aorta ID, A-P, S                37    mm       ---------    Left atrium                               Value          Reference  LA ID, A-P, ES                            39    mm       ---------  LA ID/bsa, A-P                            1.79  cm/m^2   <=2.2  LA volume, S                              81    ml       ---------  LA volume/bsa, S                          37.1  ml/m^2   ---------  LA volume, ES, 1-p A4C                    65    ml       ---------  LA volume/bsa, ES, 1-p A4C                29.8  ml/m^2   ---------  LA volume, ES, 1-p A2C                    99    ml       ---------  LA volume/bsa, ES, 1-p A2C                45.4  ml/m^2   ---------    Mitral valve                              Value          Reference  Mitral  E-wave peak velocity               60.2  cm/s     ---------  Mitral A-wave peak velocity               87.4  cm/s     ---------  Mitral deceleration time          (H)     451   ms       150 - 230  Mitral E/A ratio, peak                    0.6            ---------    Systemic veins                            Value          Reference  Estimated CVP                             3     mm Hg    ---------  Legend: (L)  and  (H)  mark values outside specified reference range.  ASSESSMENT AND PLAN: 1.  Aortic stenosis, moderate: I personally reviewed the patient's echo images which demonstrate severe calcification and immobility of the noncoronary cusp.  The patient's gradients have increased modestly over the last 4 years and remain in the moderate range.  There is high flow with a mild LVOT gradient as well.  I do not think the patient has severe  obstruction at the aortic valve level. I have reviewed the natural history of aortic stenosis with the patient and his wife who is present today. We have discussed the limitations of medical therapy and the poor prognosis associated with symptomatic aortic stenosis. We have reviewed potential treatment options, including ongoing medical therapy and surveillance, conventional surgical aortic valve replacement, and transcatheter aortic valve replacement. We discussed treatment options in the context of the patient's specific comorbid medical conditions. Considering his asymptomatic status and moderate AS, continued surveillance is most appropriate.  The patient is able to achieve greater than 4 metabolic equivalents without any cardiopulmonary symptoms and he appears clinically stable to proceed with surgery without further testing.  He should be at low cardiac risk.  I recommended annual echo studies and surveillance.  I discussed the natural history of aortic stenosis at length with the patient and his wife.  They understand to watch out for symptoms of shortness  of breath, chest discomfort, lightheadedness, or progressive fatigue.  I will see them back next year after his echo is completed.  2.  Hypertension: Blood pressure is well controlled on losartan  3.  Hyperlipidemia: Treated with pravastatin, followed closely by his primary physician.  4.  Preoperative assessment: See above: Okay to proceed with surgery in the setting of moderate aortic stenosis without associated symptoms.  Current medicines are reviewed with the patient today.  The patient does not have concerns regarding medicines.  Labs/ tests ordered today include:  No orders of the defined types were placed in this encounter.  Disposition:   FU one year with an echo prior to the visit  Signed, Sherren Mocha, MD  01/10/2018 11:56 AM    [address], [place], [place]  [id number] Phone: [phone]; Fax: [phone]

## 2018-01-10 NOTE — Patient Instructions (Signed)
Medication Instructions:  Your physician recommends that you continue on your current medications as directed. Please refer to the Current Medication list given to you today.  Labwork: No new orders.   Testing/Procedures: Your physician has requested that you have an echocardiogram in 1 YEAR. Echocardiography is a painless test that uses sound waves to create images of your heart. It provides your doctor with information about the size and shape of your heart and how well your heart's chambers and valves are working. This procedure takes approximately one hour. There are no restrictions for this procedure.  Follow-Up: Your physician wants you to follow-up in: 1 YEAR with Dr Burt Knack. You will receive a reminder letter in the mail two months in advance. If you don't receive a letter, please call our office to schedule the follow-up appointment.   Any Other Special Instructions Will Be Listed Below (If Applicable).  You are cleared for surgery from a cardiac standpoint.     If you need a refill on your cardiac medications before your next appointment, please call your pharmacy.

## 2018-01-11 ENCOUNTER — Ambulatory Visit: Payer: Medicare Other | Admitting: Cardiovascular Disease

## 2018-01-17 ENCOUNTER — Other Ambulatory Visit: Payer: Self-pay | Admitting: Internal Medicine

## 2018-01-24 DIAGNOSIS — R001 Bradycardia, unspecified: Secondary | ICD-10-CM | POA: Diagnosis not present

## 2018-01-24 DIAGNOSIS — I517 Cardiomegaly: Secondary | ICD-10-CM | POA: Diagnosis not present

## 2018-01-27 DIAGNOSIS — M961 Postlaminectomy syndrome, not elsewhere classified: Secondary | ICD-10-CM | POA: Diagnosis not present

## 2018-01-27 DIAGNOSIS — M48062 Spinal stenosis, lumbar region with neurogenic claudication: Secondary | ICD-10-CM | POA: Diagnosis not present

## 2018-01-27 DIAGNOSIS — M8008XD Age-related osteoporosis with current pathological fracture, vertebra(e), subsequent encounter for fracture with routine healing: Secondary | ICD-10-CM | POA: Diagnosis not present

## 2018-01-28 DIAGNOSIS — K625 Hemorrhage of anus and rectum: Secondary | ICD-10-CM | POA: Diagnosis not present

## 2018-01-28 DIAGNOSIS — M48062 Spinal stenosis, lumbar region with neurogenic claudication: Secondary | ICD-10-CM | POA: Diagnosis not present

## 2018-01-28 DIAGNOSIS — M6281 Muscle weakness (generalized): Secondary | ICD-10-CM | POA: Diagnosis not present

## 2018-01-28 DIAGNOSIS — S32000S Wedge compression fracture of unspecified lumbar vertebra, sequela: Secondary | ICD-10-CM | POA: Diagnosis not present

## 2018-01-31 ENCOUNTER — Ambulatory Visit: Payer: Medicare Other | Admitting: Internal Medicine

## 2018-01-31 DIAGNOSIS — M4726 Other spondylosis with radiculopathy, lumbar region: Secondary | ICD-10-CM | POA: Diagnosis not present

## 2018-01-31 DIAGNOSIS — M48062 Spinal stenosis, lumbar region with neurogenic claudication: Secondary | ICD-10-CM | POA: Diagnosis not present

## 2018-01-31 DIAGNOSIS — E119 Type 2 diabetes mellitus without complications: Secondary | ICD-10-CM | POA: Diagnosis not present

## 2018-01-31 DIAGNOSIS — Z4789 Encounter for other orthopedic aftercare: Secondary | ICD-10-CM | POA: Diagnosis not present

## 2018-01-31 DIAGNOSIS — M8008XD Age-related osteoporosis with current pathological fracture, vertebra(e), subsequent encounter for fracture with routine healing: Secondary | ICD-10-CM | POA: Diagnosis not present

## 2018-01-31 DIAGNOSIS — I10 Essential (primary) hypertension: Secondary | ICD-10-CM | POA: Diagnosis not present

## 2018-02-15 ENCOUNTER — Ambulatory Visit: Payer: Medicare Other | Admitting: Internal Medicine

## 2018-02-15 ENCOUNTER — Other Ambulatory Visit (INDEPENDENT_AMBULATORY_CARE_PROVIDER_SITE_OTHER): Payer: Medicare Other

## 2018-02-15 ENCOUNTER — Encounter: Payer: Self-pay | Admitting: Internal Medicine

## 2018-02-15 VITALS — BP 172/78 | HR 60 | Temp 97.7°F | Resp 12 | Ht 71.0 in | Wt 213.0 lb

## 2018-02-15 DIAGNOSIS — I10 Essential (primary) hypertension: Secondary | ICD-10-CM

## 2018-02-15 DIAGNOSIS — N183 Chronic kidney disease, stage 3 unspecified: Secondary | ICD-10-CM

## 2018-02-15 DIAGNOSIS — D539 Nutritional anemia, unspecified: Secondary | ICD-10-CM

## 2018-02-15 DIAGNOSIS — E118 Type 2 diabetes mellitus with unspecified complications: Secondary | ICD-10-CM | POA: Diagnosis not present

## 2018-02-15 DIAGNOSIS — E559 Vitamin D deficiency, unspecified: Secondary | ICD-10-CM

## 2018-02-15 LAB — VITAMIN B12: VITAMIN B 12: 371 pg/mL (ref 211–911)

## 2018-02-15 LAB — BASIC METABOLIC PANEL
BUN: 18 mg/dL (ref 6–23)
CALCIUM: 10.4 mg/dL (ref 8.4–10.5)
CHLORIDE: 100 meq/L (ref 96–112)
CO2: 30 meq/L (ref 19–32)
Creatinine, Ser: 1.24 mg/dL (ref 0.40–1.50)
GFR: 59.77 mL/min — ABNORMAL LOW (ref >60.00)
Glucose, Bld: 137 mg/dL — ABNORMAL HIGH (ref 70–99)
POTASSIUM: 4.6 meq/L (ref 3.5–5.1)
SODIUM: 140 meq/L (ref 135–145)

## 2018-02-15 LAB — CBC WITH DIFFERENTIAL/PLATELET
BASOS ABS: 0.1 10*3/uL (ref 0.0–0.1)
BASOS PCT: 1.1 % (ref 0.0–3.0)
EOS ABS: 0.2 10*3/uL (ref 0.0–0.7)
Eosinophils Relative: 2.9 % (ref 0.0–5.0)
HCT: 42.8 % (ref 39.0–52.0)
HEMOGLOBIN: 14.2 g/dL (ref 13.0–17.0)
Lymphocytes Relative: 22.3 % (ref 12.0–46.0)
Lymphs Abs: 1.8 10*3/uL (ref 0.7–4.0)
MCHC: 33.2 g/dL (ref 30.0–36.0)
MCV: 89.1 fl (ref 78.0–100.0)
Monocytes Absolute: 0.6 10*3/uL (ref 0.1–1.0)
Monocytes Relative: 7.7 % (ref 3.0–12.0)
Neutro Abs: 5.3 10*3/uL (ref 1.4–7.7)
Neutrophils Relative %: 66 % (ref 43.0–77.0)
PLATELETS: 290 10*3/uL (ref 150.0–400.0)
RBC: 4.81 Mil/uL (ref 4.22–5.81)
RDW: 15.1 % (ref 11.5–15.5)
WBC: 8.1 10*3/uL (ref 4.0–10.5)

## 2018-02-15 LAB — HEMOGLOBIN A1C: Hgb A1c MFr Bld: 7.2 % — ABNORMAL HIGH (ref 4.6–6.5)

## 2018-02-15 LAB — VITAMIN D 25 HYDROXY (VIT D DEFICIENCY, FRACTURES): VITD: 21.64 ng/mL — ABNORMAL LOW (ref 30.00–100.00)

## 2018-02-15 LAB — FERRITIN: Ferritin: 115.1 ng/mL (ref 22.0–322.0)

## 2018-02-15 LAB — IBC PANEL
IRON: 77 ug/dL (ref 42–165)
Saturation Ratios: 22.4 % (ref 20.0–50.0)
TRANSFERRIN: 246 mg/dL (ref 212.0–360.0)

## 2018-02-15 LAB — FOLATE: Folate: 10.3 ng/mL (ref >5.9)

## 2018-02-15 MED ORDER — CHOLECALCIFEROL 50 MCG (2000 UT) PO TABS: 1.0000 | ORAL_TABLET | Freq: Every day | ORAL | 1 refills | Status: DC

## 2018-02-15 NOTE — Progress Notes (Signed)
Subjective:  Patient ID: Ryan Duke, male    DOB: 05-Feb-1941  Age: 77 y.o. MRN: [id number]  CC: Anemia and Hypertension   HPI Ryan Duke presents for f/up - he recently underwent back surgery and tells me he is feeling better.  His back pain has diminished.  He continues to take several hydrocodone doses a day to control the back pain.  The back pain is exacerbated by movement and occasionally radiates into his thighs.  He denies numbness, weakness, tingling in his lower extremities.  He thinks his blood pressure is elevated today because he is in pain.  He has been compliant with the atenolol, chlorthalidone, and losartan.  He denies any recent episodes of headache/blurred vision/chest pain/shortness of breath/palpitations/edema/fatigue.  Outpatient Medications Prior to Visit  Medication Sig Dispense Refill  . atenolol-chlorthalidone (TENORETIC) 100-25 MG tablet Take 1 tablet by mouth daily.    . DULoxetine (CYMBALTA) 60 MG capsule Take 1 capsule (60 mg total) by mouth daily. 90 capsule 1  . HYDROcodone-acetaminophen (NORCO) 10-325 MG tablet TAKE  (1)  TABLET  EVERY FOUR HOURS AS NEEDED. 120 tablet 0  . losartan (COZAAR) 100 MG tablet Take 100 mg by mouth daily.    . pravastatin (PRAVACHOL) 40 MG tablet TAKE 1 TABLET ONCE DAILY. 90 tablet 0  . senna (SENOKOT) 8.6 MG TABS tablet Take 1 tablet (8.6 mg total) by mouth at bedtime as needed for mild constipation. 120 each 0  . tamsulosin (FLOMAX) 0.4 MG CAPS capsule TAKE (1) CAPSULE DAILY. 90 capsule 1  . methocarbamol (ROBAXIN) 500 MG tablet Take 1 tablet (500 mg total) by mouth 3 (three) times daily. 20 tablet 0   No facility-administered medications prior to visit.     ROS Review of Systems  Constitutional: Negative for chills, diaphoresis, fatigue and unexpected weight change.  HENT: Negative for trouble swallowing.   Respiratory: Negative for cough, shortness of breath and wheezing.   Cardiovascular: Negative.  Negative for  chest pain, palpitations and leg swelling.  Gastrointestinal: Negative for abdominal pain, diarrhea, nausea and vomiting.  Endocrine: Negative.  Negative for polydipsia, polyphagia and polyuria.  Genitourinary: Negative.  Negative for difficulty urinating.  Musculoskeletal: Negative.  Negative for arthralgias and myalgias.  Skin: Negative.  Negative for color change.  Allergic/Immunologic: Negative.   Neurological: Negative.  Negative for dizziness, weakness, light-headedness and headaches.  Hematological: Negative for adenopathy. Does not bruise/bleed easily.  Psychiatric/Behavioral: Negative.     Objective:  BP (!) 172/78 (BP Location: Left Arm, Patient Position: Sitting, Cuff Size: Normal)   Pulse 60   Temp 97.7 F (36.5 C) (Oral)   Resp 12   Ht 5\' 11"  (1.803 m)   Wt 213 lb 0.2 oz (96.6 kg)   SpO2 97%   BMI 29.71 kg/m   BP Readings from Last 3 Encounters:  02/15/18 (!) 172/78  01/10/18 132/78  11/18/17 (!) 168/79    Wt Readings from Last 3 Encounters:  02/15/18 213 lb 0.2 oz (96.6 kg)  01/10/18 203 lb 12.8 oz (92.4 kg)  11/01/17 206 lb (93.4 kg)    Physical Exam  Constitutional: No distress.  HENT:  Mouth/Throat: No oropharyngeal exudate.  Eyes: Conjunctivae are normal. Left eye exhibits no discharge. No scleral icterus.  Neck: Normal range of motion. Neck supple. No JVD present. No thyromegaly present.  Cardiovascular: Normal rate, regular rhythm and normal heart sounds. Exam reveals no gallop.  No murmur heard. Pulmonary/Chest: Effort normal and breath sounds normal. No respiratory distress. He  has no wheezes. He has no rales.  Abdominal: Soft. Bowel sounds are normal. He exhibits no distension and no mass. There is no tenderness. There is no guarding.  Musculoskeletal: Normal range of motion. He exhibits no edema or tenderness.       Back:  Lymphadenopathy:    He has no cervical adenopathy.  Neurological: He displays no atrophy and no tremor. No cranial nerve  deficit or sensory deficit. He exhibits normal muscle tone. He displays a negative Romberg sign. He displays no seizure activity. Coordination and gait normal.  Neg SLR in BLE  Skin: Skin is warm and dry. No rash noted. He is not diaphoretic. No erythema. No pallor.  Vitals reviewed.   Lab Results  Component Value Date   WBC 8.1 02/15/2018   HGB 14.2 02/15/2018   HCT 42.8 02/15/2018   PLT 290.0 02/15/2018   GLUCOSE 137 (H) 02/15/2018   CHOL 172 03/15/2017   TRIG 90.0 03/15/2017   HDL 51.20 03/15/2017   LDLDIRECT 100.0 06/13/2009   LDLCALC 102 (H) 03/15/2017   ALT 27 09/09/2017   AST 30 09/09/2017   NA 140 02/15/2018   K 4.6 02/15/2018   CL 100 02/15/2018   CREATININE 1.24 02/15/2018   BUN 18 02/15/2018   CO2 30 02/15/2018   TSH 2.47 02/15/2018   PSA 1.05 10/28/2015   INR 1.04 09/14/2017   HGBA1C 7.2 (H) 02/15/2018   MICROALBUR 41.1 (H) 03/15/2017    Dg Inject Diag/thera/inc Needle/cath/plc Epi/lumb/sac W/img  Result Date: 11/18/2017 CLINICAL DATA:  Lumbosacral spondylosis without myelopathy. Low back and bilateral lower extremity pain, equal between left and right sides. The patient reports the mild improvement following the first injection, however the pain has been worsening as of late. FLUOROSCOPY TIME:  Radiation Exposure Index (as provided by the fluoroscopic device): 13.82 microGray*m^2 Fluoroscopy Time (in minutes and seconds):  7 seconds PROCEDURE: The procedure, risks, benefits, and alternatives were explained to the patient. Questions regarding the procedure were encouraged and answered. The patient understands and consents to the procedure. LUMBAR EPIDURAL INJECTION: An interlaminar approach was performed on the right at L2-3. The overlying skin was cleansed and anesthetized. A 3.5 inch 20 gauge epidural needle was advanced using loss-of-resistance technique. DIAGNOSTIC EPIDURAL INJECTION: Injection of Isovue-M 200 shows a good epidural pattern with spread above and  below the level of needle placement, primarily on the right. No vascular opacification is seen. THERAPEUTIC EPIDURAL INJECTION: 120 mg of Depo-Medrol mixed with 2.5 mL of 1% lidocaine were instilled. The injection was painful but tolerated by the patient, injecting slowly and with several breaks. He was discharged thirty minutes following the injection in good condition. Preprocedure pain level:  10/10 Postprocedure pain level: 1/95 COMPLICATIONS: None IMPRESSION: Technically successful lumbar interlaminar epidural injection on the right at L2-3. Electronically Signed   By: Logan Bores M.D.   On: 11/18/2017 10:16    Assessment & Plan:   Azhar was seen today for anemia and hypertension.  Diagnoses and all orders for this visit:  Deficiency anemia- His H&H are normal now.  I will screen for vitamin deficiencies. -     CBC with Differential/Platelet; Future -     IBC panel; Future -     Vitamin B12; Future -     Ferritin; Future -     Folate; Future -     Vitamin B1; Future  Type 2 diabetes mellitus with complication, without long-term current use of insulin ([place])- His A1c is up to 7.2%.  His blood sugars are adequately well controlled.  At this time will not add a medication to treat this. -     Basic metabolic panel; Future -     Hemoglobin A1c; Future  Essential hypertension- His blood pressure is not adequately well controlled.  I will treat the vitamin D deficiency.  This should help lower his blood pressure.  I have asked him return in the next few months for a blood pressure recheck and to monitor his blood pressure at home. -     Basic metabolic panel; Future -     Thyroid Panel With TSH; Future -     VITAMIN D 25 Hydroxy (Vit-D Deficiency, Fractures); Future  Kidney disease, chronic, 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) ([place])- His renal function is stable.  He will avoid nephrotoxic agents. -     Basic metabolic panel; Future  Vitamin D deficiency -     Cholecalciferol 2000 units TABS;  Take 1 tablet (2,000 Units total) by mouth daily.   I have discontinued [name]. Smyers's methocarbamol. I am also having him start on Cholecalciferol. Additionally, I am having him maintain his senna, losartan, DULoxetine, tamsulosin, HYDROcodone-acetaminophen, atenolol-chlorthalidone, and pravastatin.  Meds ordered this encounter  Medications  . Cholecalciferol 2000 units TABS    Sig: Take 1 tablet (2,000 Units total) by mouth daily.    Dispense:  90 tablet    Refill:  1     Follow-up: Return in about 3 months (around 05/18/2018).  [name], MD

## 2018-02-15 NOTE — Patient Instructions (Signed)

## 2018-02-19 LAB — THYROID PANEL WITH TSH
Free Thyroxine Index: 2.3 (ref 1.4–3.8)
T3 UPTAKE: 31 % (ref 22–35)
T4, Total: 7.5 ug/dL (ref 4.9–10.5)
TSH: 2.47 mIU/L (ref 0.40–4.50)

## 2018-02-19 LAB — VITAMIN B1: VITAMIN B1 (THIAMINE): 17 nmol/L (ref 8–30)

## 2018-03-01 ENCOUNTER — Other Ambulatory Visit: Payer: Self-pay | Admitting: Internal Medicine

## 2018-03-01 DIAGNOSIS — M48062 Spinal stenosis, lumbar region with neurogenic claudication: Secondary | ICD-10-CM | POA: Diagnosis not present

## 2018-03-04 DIAGNOSIS — Z4789 Encounter for other orthopedic aftercare: Secondary | ICD-10-CM | POA: Diagnosis not present

## 2018-03-04 DIAGNOSIS — M8008XD Age-related osteoporosis with current pathological fracture, vertebra(e), subsequent encounter for fracture with routine healing: Secondary | ICD-10-CM | POA: Diagnosis not present

## 2018-03-04 DIAGNOSIS — M48062 Spinal stenosis, lumbar region with neurogenic claudication: Secondary | ICD-10-CM | POA: Diagnosis not present

## 2018-05-05 DIAGNOSIS — M48062 Spinal stenosis, lumbar region with neurogenic claudication: Secondary | ICD-10-CM | POA: Diagnosis not present

## 2018-05-05 DIAGNOSIS — M8008XD Age-related osteoporosis with current pathological fracture, vertebra(e), subsequent encounter for fracture with routine healing: Secondary | ICD-10-CM | POA: Diagnosis not present

## 2018-05-12 DIAGNOSIS — R2689 Other abnormalities of gait and mobility: Secondary | ICD-10-CM | POA: Diagnosis not present

## 2018-05-12 DIAGNOSIS — M6281 Muscle weakness (generalized): Secondary | ICD-10-CM | POA: Diagnosis not present

## 2018-05-12 DIAGNOSIS — M545 Low back pain: Secondary | ICD-10-CM | POA: Diagnosis not present

## 2018-05-16 DIAGNOSIS — M545 Low back pain: Secondary | ICD-10-CM | POA: Diagnosis not present

## 2018-05-16 DIAGNOSIS — M6281 Muscle weakness (generalized): Secondary | ICD-10-CM | POA: Diagnosis not present

## 2018-05-16 DIAGNOSIS — R2689 Other abnormalities of gait and mobility: Secondary | ICD-10-CM | POA: Diagnosis not present

## 2018-05-18 ENCOUNTER — Ambulatory Visit: Payer: Medicare Other | Admitting: Internal Medicine

## 2018-05-18 DIAGNOSIS — M6281 Muscle weakness (generalized): Secondary | ICD-10-CM | POA: Diagnosis not present

## 2018-05-18 DIAGNOSIS — R2689 Other abnormalities of gait and mobility: Secondary | ICD-10-CM | POA: Diagnosis not present

## 2018-05-18 DIAGNOSIS — M545 Low back pain: Secondary | ICD-10-CM | POA: Diagnosis not present

## 2018-05-23 DIAGNOSIS — M545 Low back pain: Secondary | ICD-10-CM | POA: Diagnosis not present

## 2018-05-23 DIAGNOSIS — R2689 Other abnormalities of gait and mobility: Secondary | ICD-10-CM | POA: Diagnosis not present

## 2018-05-23 DIAGNOSIS — M6281 Muscle weakness (generalized): Secondary | ICD-10-CM | POA: Diagnosis not present

## 2018-05-26 ENCOUNTER — Other Ambulatory Visit (INDEPENDENT_AMBULATORY_CARE_PROVIDER_SITE_OTHER): Payer: Medicare Other

## 2018-05-26 ENCOUNTER — Encounter: Payer: Self-pay | Admitting: Internal Medicine

## 2018-05-26 ENCOUNTER — Ambulatory Visit: Payer: Medicare Other | Admitting: Internal Medicine

## 2018-05-26 VITALS — BP 140/74 | HR 66 | Resp 16 | Ht 71.0 in | Wt 219.0 lb

## 2018-05-26 DIAGNOSIS — E118 Type 2 diabetes mellitus with unspecified complications: Secondary | ICD-10-CM

## 2018-05-26 DIAGNOSIS — E785 Hyperlipidemia, unspecified: Secondary | ICD-10-CM

## 2018-05-26 DIAGNOSIS — F322 Major depressive disorder, single episode, severe without psychotic features: Secondary | ICD-10-CM

## 2018-05-26 DIAGNOSIS — E559 Vitamin D deficiency, unspecified: Secondary | ICD-10-CM

## 2018-05-26 DIAGNOSIS — I10 Essential (primary) hypertension: Secondary | ICD-10-CM | POA: Diagnosis not present

## 2018-05-26 LAB — HM DIABETES FOOT EXAM

## 2018-05-26 LAB — URINALYSIS, ROUTINE W REFLEX MICROSCOPIC
BILIRUBIN URINE: NEGATIVE
HGB URINE DIPSTICK: NEGATIVE
KETONES UR: NEGATIVE
LEUKOCYTES UA: NEGATIVE
NITRITE: NEGATIVE
SPECIFIC GRAVITY, URINE: 1.02 (ref 1.000–1.030)
URINE GLUCOSE: NEGATIVE
UROBILINOGEN UA: 0.2 (ref 0.0–1.0)
WBC, UA: NONE SEEN — AB (ref 0–?)
pH: 6 (ref 5.0–8.0)

## 2018-05-26 LAB — LIPID PANEL
CHOL/HDL RATIO: 4
CHOLESTEROL: 158 mg/dL (ref 0–200)
HDL: 43 mg/dL (ref >39.00)
NONHDL: 115.21
TRIGLYCERIDES: 309 mg/dL — AB (ref 0.0–149.0)
VLDL: 61.8 mg/dL — ABNORMAL HIGH (ref 0.0–40.0)

## 2018-05-26 LAB — MICROALBUMIN / CREATININE URINE RATIO
CREATININE, U: 121.8 mg/dL
MICROALB UR: 14.5 mg/dL — AB (ref 0.0–1.9)
Microalb Creat Ratio: 11.9 mg/g (ref 0.0–30.0)

## 2018-05-26 LAB — BASIC METABOLIC PANEL
BUN: 22 mg/dL (ref 6–23)
CO2: 30 meq/L (ref 19–32)
Calcium: 9.6 mg/dL (ref 8.4–10.5)
Chloride: 103 mEq/L (ref 96–112)
Creatinine, Ser: 1.32 mg/dL (ref 0.40–1.50)
GFR: 55.57 mL/min — AB (ref >60.00)
Glucose, Bld: 174 mg/dL — ABNORMAL HIGH (ref 70–99)
Potassium: 3.5 mEq/L (ref 3.5–5.1)
SODIUM: 140 meq/L (ref 135–145)

## 2018-05-26 LAB — HEMOGLOBIN A1C: HEMOGLOBIN A1C: 7.1 % — AB (ref 4.6–6.5)

## 2018-05-26 LAB — VITAMIN D 25 HYDROXY (VIT D DEFICIENCY, FRACTURES): VITD: 33.25 ng/mL (ref 30.00–100.00)

## 2018-05-26 LAB — LDL CHOLESTEROL, DIRECT: Direct LDL: 81 mg/dL

## 2018-05-26 NOTE — Patient Instructions (Signed)

## 2018-05-26 NOTE — Progress Notes (Signed)
Subjective:  Patient ID: Ryan Duke, male    DOB: 02-18-1941  Age: 77 y.o. MRN: [id number]  CC: Hypertension and Diabetes   HPI Dominie Benedick Blanda presents for f/up - He is doing much better compared to the last time I saw him.  His back pain has resolved and he is doing physical therapy.  He has felt well recently with no episodes of CP, DOE, palpitations, dizziness, lightheadedness, or fatigue.  He also thinks his blood sugars have been relatively well controlled.  Outpatient Medications Prior to Visit  Medication Sig Dispense Refill  . atenolol-chlorthalidone (TENORETIC) 100-25 MG tablet TAKE 1 TABLET ONCE DAILY. 90 tablet 0  . Cholecalciferol 2000 units TABS Take 1 tablet (2,000 Units total) by mouth daily. 90 tablet 1  . DULoxetine (CYMBALTA) 60 MG capsule Take 1 capsule (60 mg total) by mouth daily. 90 capsule 1  . losartan (COZAAR) 100 MG tablet TAKE 1 TABLET DAILY FOR HYPERTENSION. 90 tablet 0  . pravastatin (PRAVACHOL) 40 MG tablet TAKE 1 TABLET ONCE DAILY. 90 tablet 0  . senna (SENOKOT) 8.6 MG TABS tablet Take 1 tablet (8.6 mg total) by mouth at bedtime as needed for mild constipation. 120 each 0  . tamsulosin (FLOMAX) 0.4 MG CAPS capsule TAKE (1) CAPSULE DAILY. 90 capsule 1  . HYDROcodone-acetaminophen (NORCO) 10-325 MG tablet TAKE  (1)  TABLET  EVERY FOUR HOURS AS NEEDED. 120 tablet 0   No facility-administered medications prior to visit.     ROS Review of Systems  Constitutional: Negative for diaphoresis, fatigue and unexpected weight change.  HENT: Negative.   Respiratory: Negative for apnea, cough, chest tightness, shortness of breath and wheezing.   Cardiovascular: Negative for chest pain, palpitations and leg swelling.  Gastrointestinal: Negative for abdominal pain, constipation, diarrhea, nausea and vomiting.  Endocrine: Negative for polydipsia, polyphagia and polyuria.  Genitourinary: Negative.  Negative for difficulty urinating.  Musculoskeletal: Negative for  arthralgias and myalgias.  Skin: Negative.  Negative for color change and pallor.  Allergic/Immunologic: Negative.   Neurological: Negative.  Negative for dizziness, weakness and light-headedness.  Hematological: Negative for adenopathy. Does not bruise/bleed easily.  Psychiatric/Behavioral: Negative.     Objective:  BP 140/74 (BP Location: Left Arm, Patient Position: Sitting, Cuff Size: Normal)   Pulse 66   Resp 16   Ht 5\' 11"  (1.803 m)   Wt 219 lb (99.3 kg)   SpO2 96%   BMI 30.54 kg/m   BP Readings from Last 3 Encounters:  05/26/18 140/74  02/15/18 (!) 172/78  01/10/18 132/78    Wt Readings from Last 3 Encounters:  05/26/18 219 lb (99.3 kg)  02/15/18 213 lb 0.2 oz (96.6 kg)  01/10/18 203 lb 12.8 oz (92.4 kg)    Physical Exam  Constitutional: He is oriented to person, place, and time. No distress.  HENT:  Mouth/Throat: Oropharynx is clear and moist. No oropharyngeal exudate.  Eyes: Conjunctivae are normal. No scleral icterus.  Neck: Normal range of motion. Neck supple. No JVD present. No thyromegaly present.  Cardiovascular: Normal rate and regular rhythm. Exam reveals no gallop.  Murmur heard.  Systolic murmur is present with a grade of 3/6. 3/6 SEM RUSB  Pulmonary/Chest: Effort normal and breath sounds normal. No respiratory distress. He has no wheezes. He has no rales.  Abdominal: Soft. Normal appearance and bowel sounds are normal. He exhibits no mass. There is no hepatosplenomegaly. There is no tenderness.  Musculoskeletal: Normal range of motion. He exhibits edema (trace LE pitting edema).  He exhibits no tenderness or deformity.  Neurological: He is alert and oriented to person, place, and time.  Skin: Skin is warm and dry. No rash noted. He is not diaphoretic.  Psychiatric: He has a normal mood and affect. His behavior is normal. Judgment and thought content normal.  Vitals reviewed.   Lab Results  Component Value Date   WBC 8.1 02/15/2018   HGB 14.2  02/15/2018   HCT 42.8 02/15/2018   PLT 290.0 02/15/2018   GLUCOSE 174 (H) 05/26/2018   CHOL 158 05/26/2018   TRIG 309.0 (H) 05/26/2018   HDL 43.00 05/26/2018   LDLDIRECT 81.0 05/26/2018   LDLCALC 102 (H) 03/15/2017   ALT 27 09/09/2017   AST 30 09/09/2017   NA 140 05/26/2018   K 3.5 05/26/2018   CL 103 05/26/2018   CREATININE 1.32 05/26/2018   BUN 22 05/26/2018   CO2 30 05/26/2018   TSH 2.47 02/15/2018   PSA 1.05 10/28/2015   INR 1.04 09/14/2017   HGBA1C 7.1 (H) 05/26/2018   MICROALBUR 14.5 (H) 05/26/2018    Dg Inject Diag/thera/inc Needle/cath/plc Epi/lumb/sac W/img  Result Date: 11/18/2017 CLINICAL DATA:  Lumbosacral spondylosis without myelopathy. Low back and bilateral lower extremity pain, equal between left and right sides. The patient reports the mild improvement following the first injection, however the pain has been worsening as of late. FLUOROSCOPY TIME:  Radiation Exposure Index (as provided by the fluoroscopic device): 13.82 microGray*m^2 Fluoroscopy Time (in minutes and seconds):  7 seconds PROCEDURE: The procedure, risks, benefits, and alternatives were explained to the patient. Questions regarding the procedure were encouraged and answered. The patient understands and consents to the procedure. LUMBAR EPIDURAL INJECTION: An interlaminar approach was performed on the right at L2-3. The overlying skin was cleansed and anesthetized. A 3.5 inch 20 gauge epidural needle was advanced using loss-of-resistance technique. DIAGNOSTIC EPIDURAL INJECTION: Injection of Isovue-M 200 shows a good epidural pattern with spread above and below the level of needle placement, primarily on the right. No vascular opacification is seen. THERAPEUTIC EPIDURAL INJECTION: 120 mg of Depo-Medrol mixed with 2.5 mL of 1% lidocaine were instilled. The injection was painful but tolerated by the patient, injecting slowly and with several breaks. He was discharged thirty minutes following the injection in  good condition. Preprocedure pain level:  10/10 Postprocedure pain level: 5/28 COMPLICATIONS: None IMPRESSION: Technically successful lumbar interlaminar epidural injection on the right at L2-3. Electronically Signed   By: Logan Bores M.D.   On: 11/18/2017 10:16    Assessment & Plan:   Koltyn was seen today for hypertension and diabetes.  Diagnoses and all orders for this visit:  Type 2 diabetes mellitus with complication, without long-term current use of insulin ([place])- His A1c is at 7.1%.  His blood sugars are adequately well controlled. -     Microalbumin / creatinine urine ratio; Future -     Hemoglobin A1c; Future -     Basic metabolic panel; Future  Essential hypertension- His blood pressure is adequately well controlled. -     Urinalysis, Routine w reflex microscopic; Future -     Basic metabolic panel; Future  Current severe episode of major depressive disorder without psychotic features, unspecified whether recurrent ([place])- Improvement noted on duloxetine 60 mg a day.  Hyperlipidemia with target LDL less than 100- He has achieved his LDL goal and is doing well on the statin. -     Lipid panel; Future  Vitamin D deficiency- His vitamin D level is normal now. -  VITAMIN D 25 Hydroxy (Vit-D Deficiency, Fractures); Future  Morbid obesity ([place])- He is working on his lifestyle modifications to lose weight.   I have discontinued [name]. Chakraborty's HYDROcodone-acetaminophen. I am also having him maintain his senna, DULoxetine, tamsulosin, Cholecalciferol, pravastatin, losartan, and atenolol-chlorthalidone.  No orders of the defined types were placed in this encounter.    Follow-up: Return in about 6 months (around 11/25/2018).  [name], MD

## 2018-05-27 ENCOUNTER — Encounter: Payer: Self-pay | Admitting: Internal Medicine

## 2018-05-27 DIAGNOSIS — R2689 Other abnormalities of gait and mobility: Secondary | ICD-10-CM | POA: Diagnosis not present

## 2018-05-27 DIAGNOSIS — M545 Low back pain: Secondary | ICD-10-CM | POA: Diagnosis not present

## 2018-05-27 DIAGNOSIS — M6281 Muscle weakness (generalized): Secondary | ICD-10-CM | POA: Diagnosis not present

## 2018-05-28 ENCOUNTER — Other Ambulatory Visit: Payer: Self-pay | Admitting: Internal Medicine

## 2018-05-28 DIAGNOSIS — F329 Major depressive disorder, single episode, unspecified: Secondary | ICD-10-CM

## 2018-05-28 DIAGNOSIS — F32A Depression, unspecified: Secondary | ICD-10-CM

## 2018-05-30 ENCOUNTER — Other Ambulatory Visit: Payer: Self-pay | Admitting: Internal Medicine

## 2018-05-30 DIAGNOSIS — F329 Major depressive disorder, single episode, unspecified: Secondary | ICD-10-CM

## 2018-05-30 DIAGNOSIS — M6281 Muscle weakness (generalized): Secondary | ICD-10-CM | POA: Diagnosis not present

## 2018-05-30 DIAGNOSIS — F32A Depression, unspecified: Secondary | ICD-10-CM

## 2018-05-30 DIAGNOSIS — M545 Low back pain: Secondary | ICD-10-CM | POA: Diagnosis not present

## 2018-05-30 DIAGNOSIS — R2689 Other abnormalities of gait and mobility: Secondary | ICD-10-CM | POA: Diagnosis not present

## 2018-06-01 DIAGNOSIS — M545 Low back pain: Secondary | ICD-10-CM | POA: Diagnosis not present

## 2018-06-01 DIAGNOSIS — M6281 Muscle weakness (generalized): Secondary | ICD-10-CM | POA: Diagnosis not present

## 2018-06-01 DIAGNOSIS — R2689 Other abnormalities of gait and mobility: Secondary | ICD-10-CM | POA: Diagnosis not present

## 2018-06-06 ENCOUNTER — Telehealth: Payer: Self-pay

## 2018-06-06 DIAGNOSIS — M545 Low back pain: Secondary | ICD-10-CM | POA: Diagnosis not present

## 2018-06-06 DIAGNOSIS — R2689 Other abnormalities of gait and mobility: Secondary | ICD-10-CM | POA: Diagnosis not present

## 2018-06-06 DIAGNOSIS — M6281 Muscle weakness (generalized): Secondary | ICD-10-CM | POA: Diagnosis not present

## 2018-06-06 NOTE — Telephone Encounter (Signed)
Previous PA was done in error.  Submitted new case on Cover My Meds and accurate key is [id number].  If [employer] has not responded within the specified timeframe or if you have any questions about your PA submission, contact [place] directly at [facility]) [phone] or ([place]) [phone].

## 2018-06-06 NOTE — Telephone Encounter (Signed)
Ryan Duke  Prior auth started via Cover My Meds today.  If [employer] has not responded within the specified timeframe or if you have any questions about your PA submission, contact [place] directly at [facility]) [phone] or ([place]) [phone]

## 2018-06-07 ENCOUNTER — Telehealth: Payer: Self-pay

## 2018-06-07 NOTE — Telephone Encounter (Signed)
Script approved via Cover My Meds.  Effective from 06/06/2018 through 06/07/2019.

## 2018-06-10 DIAGNOSIS — M6281 Muscle weakness (generalized): Secondary | ICD-10-CM | POA: Diagnosis not present

## 2018-06-10 DIAGNOSIS — R2689 Other abnormalities of gait and mobility: Secondary | ICD-10-CM | POA: Diagnosis not present

## 2018-06-10 DIAGNOSIS — M545 Low back pain: Secondary | ICD-10-CM | POA: Diagnosis not present

## 2018-06-14 DIAGNOSIS — M545 Low back pain: Secondary | ICD-10-CM | POA: Diagnosis not present

## 2018-06-14 DIAGNOSIS — M6281 Muscle weakness (generalized): Secondary | ICD-10-CM | POA: Diagnosis not present

## 2018-06-14 DIAGNOSIS — R2689 Other abnormalities of gait and mobility: Secondary | ICD-10-CM | POA: Diagnosis not present

## 2018-06-15 DIAGNOSIS — H2513 Age-related nuclear cataract, bilateral: Secondary | ICD-10-CM | POA: Diagnosis not present

## 2018-06-15 DIAGNOSIS — H3530 Unspecified macular degeneration: Secondary | ICD-10-CM | POA: Diagnosis not present

## 2018-06-15 DIAGNOSIS — H1851 Endothelial corneal dystrophy: Secondary | ICD-10-CM | POA: Diagnosis not present

## 2018-06-15 LAB — HM DIABETES EYE EXAM

## 2018-06-20 DIAGNOSIS — M545 Low back pain: Secondary | ICD-10-CM | POA: Diagnosis not present

## 2018-06-20 DIAGNOSIS — M6281 Muscle weakness (generalized): Secondary | ICD-10-CM | POA: Diagnosis not present

## 2018-06-20 DIAGNOSIS — R2689 Other abnormalities of gait and mobility: Secondary | ICD-10-CM | POA: Diagnosis not present

## 2018-06-22 DIAGNOSIS — M545 Low back pain: Secondary | ICD-10-CM | POA: Diagnosis not present

## 2018-06-22 DIAGNOSIS — R2689 Other abnormalities of gait and mobility: Secondary | ICD-10-CM | POA: Diagnosis not present

## 2018-06-22 DIAGNOSIS — M6281 Muscle weakness (generalized): Secondary | ICD-10-CM | POA: Diagnosis not present

## 2018-06-23 DIAGNOSIS — M8008XD Age-related osteoporosis with current pathological fracture, vertebra(e), subsequent encounter for fracture with routine healing: Secondary | ICD-10-CM | POA: Diagnosis not present

## 2018-06-23 DIAGNOSIS — Z6829 Body mass index (BMI) 29.0-29.9, adult: Secondary | ICD-10-CM | POA: Diagnosis not present

## 2018-06-23 DIAGNOSIS — M48062 Spinal stenosis, lumbar region with neurogenic claudication: Secondary | ICD-10-CM | POA: Diagnosis not present

## 2018-06-27 DIAGNOSIS — M545 Low back pain: Secondary | ICD-10-CM | POA: Diagnosis not present

## 2018-06-27 DIAGNOSIS — R2689 Other abnormalities of gait and mobility: Secondary | ICD-10-CM | POA: Diagnosis not present

## 2018-06-27 DIAGNOSIS — M6281 Muscle weakness (generalized): Secondary | ICD-10-CM | POA: Diagnosis not present

## 2018-06-29 DIAGNOSIS — M545 Low back pain: Secondary | ICD-10-CM | POA: Diagnosis not present

## 2018-06-29 DIAGNOSIS — M6281 Muscle weakness (generalized): Secondary | ICD-10-CM | POA: Diagnosis not present

## 2018-06-29 DIAGNOSIS — R2689 Other abnormalities of gait and mobility: Secondary | ICD-10-CM | POA: Diagnosis not present

## 2018-06-30 DIAGNOSIS — D485 Neoplasm of uncertain behavior of skin: Secondary | ICD-10-CM | POA: Diagnosis not present

## 2018-07-01 DIAGNOSIS — C44311 Basal cell carcinoma of skin of nose: Secondary | ICD-10-CM | POA: Diagnosis not present

## 2018-07-04 DIAGNOSIS — M545 Low back pain: Secondary | ICD-10-CM | POA: Diagnosis not present

## 2018-07-04 DIAGNOSIS — R2689 Other abnormalities of gait and mobility: Secondary | ICD-10-CM | POA: Diagnosis not present

## 2018-07-04 DIAGNOSIS — M6281 Muscle weakness (generalized): Secondary | ICD-10-CM | POA: Diagnosis not present

## 2018-07-06 DIAGNOSIS — R2689 Other abnormalities of gait and mobility: Secondary | ICD-10-CM | POA: Diagnosis not present

## 2018-07-06 DIAGNOSIS — M545 Low back pain: Secondary | ICD-10-CM | POA: Diagnosis not present

## 2018-07-06 DIAGNOSIS — M6281 Muscle weakness (generalized): Secondary | ICD-10-CM | POA: Diagnosis not present

## 2018-07-11 DIAGNOSIS — M545 Low back pain: Secondary | ICD-10-CM | POA: Diagnosis not present

## 2018-07-11 DIAGNOSIS — R2689 Other abnormalities of gait and mobility: Secondary | ICD-10-CM | POA: Diagnosis not present

## 2018-07-11 DIAGNOSIS — M6281 Muscle weakness (generalized): Secondary | ICD-10-CM | POA: Diagnosis not present

## 2018-07-18 DIAGNOSIS — M545 Low back pain: Secondary | ICD-10-CM | POA: Diagnosis not present

## 2018-07-18 DIAGNOSIS — M6281 Muscle weakness (generalized): Secondary | ICD-10-CM | POA: Diagnosis not present

## 2018-07-18 DIAGNOSIS — R2689 Other abnormalities of gait and mobility: Secondary | ICD-10-CM | POA: Diagnosis not present

## 2018-08-02 DIAGNOSIS — M6281 Muscle weakness (generalized): Secondary | ICD-10-CM | POA: Diagnosis not present

## 2018-08-02 DIAGNOSIS — M545 Low back pain: Secondary | ICD-10-CM | POA: Diagnosis not present

## 2018-08-02 DIAGNOSIS — R2689 Other abnormalities of gait and mobility: Secondary | ICD-10-CM | POA: Diagnosis not present

## 2018-08-25 DIAGNOSIS — M8008XD Age-related osteoporosis with current pathological fracture, vertebra(e), subsequent encounter for fracture with routine healing: Secondary | ICD-10-CM | POA: Diagnosis not present

## 2018-08-25 DIAGNOSIS — M48062 Spinal stenosis, lumbar region with neurogenic claudication: Secondary | ICD-10-CM | POA: Diagnosis not present

## 2018-08-25 DIAGNOSIS — Z6829 Body mass index (BMI) 29.0-29.9, adult: Secondary | ICD-10-CM | POA: Diagnosis not present

## 2018-08-26 DIAGNOSIS — H2513 Age-related nuclear cataract, bilateral: Secondary | ICD-10-CM | POA: Diagnosis not present

## 2018-09-01 DIAGNOSIS — H25812 Combined forms of age-related cataract, left eye: Secondary | ICD-10-CM | POA: Diagnosis not present

## 2018-09-01 DIAGNOSIS — H2511 Age-related nuclear cataract, right eye: Secondary | ICD-10-CM | POA: Diagnosis not present

## 2018-09-02 DIAGNOSIS — Z961 Presence of intraocular lens: Secondary | ICD-10-CM | POA: Diagnosis not present

## 2018-09-02 DIAGNOSIS — Z48810 Encounter for surgical aftercare following surgery on the sense organs: Secondary | ICD-10-CM | POA: Diagnosis not present

## 2018-09-02 DIAGNOSIS — H3530 Unspecified macular degeneration: Secondary | ICD-10-CM | POA: Diagnosis not present

## 2018-09-02 DIAGNOSIS — H2512 Age-related nuclear cataract, left eye: Secondary | ICD-10-CM | POA: Diagnosis not present

## 2018-09-02 DIAGNOSIS — H1851 Endothelial corneal dystrophy: Secondary | ICD-10-CM | POA: Diagnosis not present

## 2018-09-14 ENCOUNTER — Ambulatory Visit (INDEPENDENT_AMBULATORY_CARE_PROVIDER_SITE_OTHER): Payer: Medicare Other

## 2018-09-14 DIAGNOSIS — Z23 Encounter for immunization: Secondary | ICD-10-CM | POA: Diagnosis not present

## 2018-09-22 DIAGNOSIS — H25812 Combined forms of age-related cataract, left eye: Secondary | ICD-10-CM | POA: Diagnosis not present

## 2018-09-22 DIAGNOSIS — H2511 Age-related nuclear cataract, right eye: Secondary | ICD-10-CM | POA: Diagnosis not present

## 2018-09-22 DIAGNOSIS — I10 Essential (primary) hypertension: Secondary | ICD-10-CM | POA: Diagnosis not present

## 2018-09-22 DIAGNOSIS — H25811 Combined forms of age-related cataract, right eye: Secondary | ICD-10-CM | POA: Diagnosis not present

## 2018-10-19 DIAGNOSIS — C44311 Basal cell carcinoma of skin of nose: Secondary | ICD-10-CM | POA: Diagnosis not present

## 2018-11-17 ENCOUNTER — Ambulatory Visit (INDEPENDENT_AMBULATORY_CARE_PROVIDER_SITE_OTHER): Payer: Medicare Other | Admitting: Internal Medicine

## 2018-11-17 ENCOUNTER — Encounter: Payer: Self-pay | Admitting: Internal Medicine

## 2018-11-17 ENCOUNTER — Other Ambulatory Visit (INDEPENDENT_AMBULATORY_CARE_PROVIDER_SITE_OTHER): Payer: Medicare Other

## 2018-11-17 VITALS — BP 146/62 | HR 60 | Temp 97.7°F | Resp 18 | Ht 71.0 in | Wt 225.0 lb

## 2018-11-17 DIAGNOSIS — E118 Type 2 diabetes mellitus with unspecified complications: Secondary | ICD-10-CM

## 2018-11-17 DIAGNOSIS — I10 Essential (primary) hypertension: Secondary | ICD-10-CM

## 2018-11-17 DIAGNOSIS — N183 Chronic kidney disease, stage 3 unspecified: Secondary | ICD-10-CM

## 2018-11-17 DIAGNOSIS — E785 Hyperlipidemia, unspecified: Secondary | ICD-10-CM

## 2018-11-17 LAB — URINALYSIS, ROUTINE W REFLEX MICROSCOPIC
Bilirubin Urine: NEGATIVE
HGB URINE DIPSTICK: NEGATIVE
Ketones, ur: NEGATIVE
Leukocytes, UA: NEGATIVE
NITRITE: NEGATIVE
RBC / HPF: NONE SEEN (ref 0–?)
Specific Gravity, Urine: 1.015 (ref 1.000–1.030)
Total Protein, Urine: 30 — AB
URINE GLUCOSE: NEGATIVE
Urobilinogen, UA: 0.2 (ref 0.0–1.0)
WBC, UA: NONE SEEN (ref 0–?)
pH: 6 (ref 5.0–8.0)

## 2018-11-17 LAB — LIPID PANEL
Cholesterol: 145 mg/dL (ref 0–200)
HDL: 43.3 mg/dL (ref >39.00)
LDL Cholesterol: 83 mg/dL (ref 0–99)
NONHDL: 101.67
Total CHOL/HDL Ratio: 3
Triglycerides: 94 mg/dL (ref 0.0–149.0)
VLDL: 18.8 mg/dL (ref 0.0–40.0)

## 2018-11-17 LAB — COMPREHENSIVE METABOLIC PANEL
ALK PHOS: 72 U/L (ref 39–117)
ALT: 12 U/L (ref 0–53)
AST: 14 U/L (ref 0–37)
Albumin: 4.3 g/dL (ref 3.5–5.2)
BUN: 20 mg/dL (ref 6–23)
CO2: 29 mEq/L (ref 19–32)
Calcium: 9.5 mg/dL (ref 8.4–10.5)
Chloride: 101 mEq/L (ref 96–112)
Creatinine, Ser: 1.5 mg/dL (ref 0.40–1.50)
GFR: 47.89 mL/min — AB (ref >60.00)
GLUCOSE: 172 mg/dL — AB (ref 70–99)
Potassium: 4 mEq/L (ref 3.5–5.1)
Sodium: 139 mEq/L (ref 135–145)
Total Bilirubin: 0.5 mg/dL (ref 0.2–1.2)
Total Protein: 7.2 g/dL (ref 6.0–8.3)

## 2018-11-17 LAB — CBC WITH DIFFERENTIAL/PLATELET
Basophils Absolute: 0.1 10*3/uL (ref 0.0–0.1)
Basophils Relative: 1 % (ref 0.0–3.0)
Eosinophils Absolute: 0.3 10*3/uL (ref 0.0–0.7)
Eosinophils Relative: 4.7 % (ref 0.0–5.0)
HCT: 46.3 % (ref 39.0–52.0)
Hemoglobin: 15.6 g/dL (ref 13.0–17.0)
LYMPHS ABS: 1.6 10*3/uL (ref 0.7–4.0)
Lymphocytes Relative: 25.5 % (ref 12.0–46.0)
MCHC: 33.7 g/dL (ref 30.0–36.0)
MCV: 89.5 fl (ref 78.0–100.0)
Monocytes Absolute: 0.7 10*3/uL (ref 0.1–1.0)
Monocytes Relative: 10.7 % (ref 3.0–12.0)
Neutro Abs: 3.7 10*3/uL (ref 1.4–7.7)
Neutrophils Relative %: 58.1 % (ref 43.0–77.0)
Platelets: 251 10*3/uL (ref 150.0–400.0)
RBC: 5.17 Mil/uL (ref 4.22–5.81)
RDW: 13.8 % (ref 11.5–15.5)
WBC: 6.4 10*3/uL (ref 4.0–10.5)

## 2018-11-17 LAB — HEMOGLOBIN A1C: HEMOGLOBIN A1C: 7.9 % — AB (ref 4.6–6.5)

## 2018-11-17 MED ORDER — SITAGLIPTIN PHOSPHATE 100 MG PO TABS: 100.0000 mg | ORAL_TABLET | Freq: Every day | ORAL | 1 refills | Status: DC

## 2018-11-17 NOTE — Progress Notes (Signed)
Subjective:  Patient ID: Ryan Duke, male    DOB: [birth year]  Age: 77 y.o. MRN: [id number]  CC: Hypertension; Hyperlipidemia; and Diabetes   HPI Zygmunt Mcglinn Sohm presents for f/up - He complains that he has gained 6 pounds over the last 6 months.  He does not monitor his blood sugar but he denies polys.  He is not currently taking anything for blood sugar control.  Outpatient Medications Prior to Visit  Medication Sig Dispense Refill  . atenolol-chlorthalidone (TENORETIC) 100-25 MG tablet TAKE 1 TABLET ONCE DAILY. 90 tablet 1  . Cholecalciferol 2000 units TABS Take 1 tablet (2,000 Units total) by mouth daily. 90 tablet 1  . DULoxetine (CYMBALTA) 60 MG capsule TAKE (1) CAPSULE DAILY. 90 capsule 1  . losartan (COZAAR) 100 MG tablet TAKE 1 TABLET DAILY FOR HYPERTENSION. 90 tablet 1  . pravastatin (PRAVACHOL) 40 MG tablet TAKE 1 TABLET ONCE DAILY. 90 tablet 1  . senna (SENOKOT) 8.6 MG TABS tablet Take 1 tablet (8.6 mg total) by mouth at bedtime as needed for mild constipation. 120 each 0  . tamsulosin (FLOMAX) 0.4 MG CAPS capsule TAKE (1) CAPSULE DAILY. 90 capsule 1  . atenolol-chlorthalidone (TENORETIC) 100-25 MG tablet TAKE 1 TABLET ONCE DAILY. 90 tablet 0   No facility-administered medications prior to visit.     ROS Review of Systems  Constitutional: Positive for unexpected weight change. Negative for diaphoresis and fatigue.  HENT: Negative.   Eyes: Negative for visual disturbance.  Respiratory: Negative for cough, chest tightness, shortness of breath and wheezing.   Cardiovascular: Negative for chest pain, palpitations and leg swelling.  Gastrointestinal: Negative for abdominal pain, constipation, diarrhea, nausea and vomiting.  Endocrine: Negative for polydipsia, polyphagia and polyuria.  Genitourinary: Negative.  Negative for difficulty urinating.  Musculoskeletal: Negative for back pain.  Skin: Negative.  Negative for color change and pallor.  Neurological: Negative.   Negative for dizziness, syncope, weakness and light-headedness.  Hematological: Negative for adenopathy. Does not bruise/bleed easily.  Psychiatric/Behavioral: Negative.     Objective:  BP (!) 146/62 (BP Location: Left Arm, Patient Position: Sitting, Cuff Size: Large)   Pulse 60   Temp 97.7 F (36.5 C) (Oral)   Resp 18   Ht 5\' 11"  (1.803 m)   Wt 225 lb (102.1 kg)   SpO2 95%   BMI 31.38 kg/m   BP Readings from Last 3 Encounters:  11/19/18 120/72  11/17/18 (!) 146/62  05/26/18 140/74    Wt Readings from Last 3 Encounters:  11/19/18 225 lb (102.1 kg)  11/17/18 225 lb (102.1 kg)  05/26/18 219 lb (99.3 kg)    Physical Exam Vitals signs reviewed.  Constitutional:      Appearance: He is obese.  HENT:     Nose: Nose normal.  Eyes:     Conjunctiva/sclera: Conjunctivae normal.  Neck:     Musculoskeletal: Normal range of motion and neck supple.  Cardiovascular:     Rate and Rhythm: Normal rate and regular rhythm.     Heart sounds: S1 normal and S2 normal. Murmur present. Systolic murmur present with a grade of 2/6. No diastolic murmur. No gallop.   Pulmonary:     Effort: Pulmonary effort is normal.     Breath sounds: Normal breath sounds. No stridor. No wheezing, rhonchi or rales.  Abdominal:     General: Abdomen is flat. Bowel sounds are normal.     Palpations: There is no hepatomegaly, splenomegaly or mass.     Tenderness: There is  no abdominal tenderness.     Hernia: No hernia is present.  Musculoskeletal: Normal range of motion.        General: No tenderness.     Right lower leg: No edema.     Left lower leg: No edema.  Skin:    General: Skin is warm and dry.  Neurological:     General: No focal deficit present.     Mental Status: He is oriented to person, place, and time. Mental status is at baseline.     Lab Results  Component Value Date   WBC 6.4 11/17/2018   HGB 15.6 11/17/2018   HCT 46.3 11/17/2018   PLT 251.0 11/17/2018   GLUCOSE 172 (H) 11/17/2018     CHOL 145 11/17/2018   TRIG 94.0 11/17/2018   HDL 43.30 11/17/2018   LDLDIRECT 81.0 05/26/2018   LDLCALC 83 11/17/2018   ALT 12 11/17/2018   AST 14 11/17/2018   NA 139 11/17/2018   K 4.0 11/17/2018   CL 101 11/17/2018   CREATININE 1.50 11/17/2018   BUN 20 11/17/2018   CO2 29 11/17/2018   TSH 2.47 02/15/2018   PSA 1.05 10/28/2015   INR 1.04 09/14/2017   HGBA1C 7.9 (H) 11/17/2018   MICROALBUR 14.5 (H) 05/26/2018    Dg Inject Diag/thera/inc Needle/cath/plc Epi/lumb/sac W/img  Result Date: 11/18/2017 CLINICAL DATA:  Lumbosacral spondylosis without myelopathy. Low back and bilateral lower extremity pain, equal between left and right sides. The patient reports the mild improvement following the first injection, however the pain has been worsening as of late. FLUOROSCOPY TIME:  Radiation Exposure Index (as provided by the fluoroscopic device): 13.82 microGray*m^2 Fluoroscopy Time (in minutes and seconds):  7 seconds PROCEDURE: The procedure, risks, benefits, and alternatives were explained to the patient. Questions regarding the procedure were encouraged and answered. The patient understands and consents to the procedure. LUMBAR EPIDURAL INJECTION: An interlaminar approach was performed on the right at L2-3. The overlying skin was cleansed and anesthetized. A 3.5 inch 20 gauge epidural needle was advanced using loss-of-resistance technique. DIAGNOSTIC EPIDURAL INJECTION: Injection of Isovue-M 200 shows a good epidural pattern with spread above and below the level of needle placement, primarily on the right. No vascular opacification is seen. THERAPEUTIC EPIDURAL INJECTION: 120 mg of Depo-Medrol mixed with 2.5 mL of 1% lidocaine were instilled. The injection was painful but tolerated by the patient, injecting slowly and with several breaks. He was discharged thirty minutes following the injection in good condition. Preprocedure pain level:  10/10 Postprocedure pain level: 5/32 COMPLICATIONS: None  IMPRESSION: Technically successful lumbar interlaminar epidural injection on the right at L2-3. Electronically Signed   By: Logan Bores M.D.   On: 11/18/2017 10:16    Assessment & Plan:   Miranda was seen today for hypertension, hyperlipidemia and diabetes.  Diagnoses and all orders for this visit:  Essential hypertension- His blood pressure is adequately well controlled. -     CBC with Differential/Platelet; Future  Type II diabetes mellitus with manifestations ([place])- His A1c is up to 7.9%.  I have asked him to improve his lifestyle modifications and to start taking a DPP 4 inhibitor. -     Hemoglobin A1c; Future -     sitaGLIPtin (JANUVIA) 100 MG tablet; Take 1 tablet (100 mg total) by mouth daily.  Kidney disease, chronic, 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) ([place])- His creatinine clearance is stable at 49.  He will avoid nephrotoxic agents and will continue to improve blood pressure and blood sugar control. -  Comprehensive metabolic panel; Future -     Urinalysis, Routine w reflex microscopic; Future  Hyperlipidemia with target LDL less than 100- He has achieved his LDL goal and is doing well on the statin. -     Lipid panel; Future -     Comprehensive metabolic panel; Future   I am having [name]. Petrey start on sitaGLIPtin. I am also having him maintain his senna, Cholecalciferol, atenolol-chlorthalidone, tamsulosin, pravastatin, losartan, and DULoxetine.  Meds ordered this encounter  Medications  . sitaGLIPtin (JANUVIA) 100 MG tablet    Sig: Take 1 tablet (100 mg total) by mouth daily.    Dispense:  90 tablet    Refill:  1     Follow-up: Return in about 6 months (around 05/19/2019).  [name], MD

## 2018-11-17 NOTE — Patient Instructions (Signed)
Type 2 Diabetes Mellitus, Diagnosis, Adult Type 2 diabetes (type 2 diabetes mellitus) is a long-term (chronic) disease. In type 2 diabetes, one or both of these problems may be present:  The pancreas does not make enough of a hormone called insulin.  Cells in the body do not respond properly to insulin that the body makes (insulin resistance). Normally, insulin allows blood sugar (glucose) to enter cells in the body. The cells use glucose for energy. Insulin resistance or lack of insulin causes excess glucose to build up in the blood instead of going into cells. As a result, high blood glucose (hyperglycemia) develops. What increases the risk? The following factors may make you more likely to develop type 2 diabetes:  Having a family member with type 2 diabetes.  Being overweight or obese.  Having an inactive (sedentary) lifestyle.  Having been diagnosed with insulin resistance.  Having a history of prediabetes, gestational diabetes, or polycystic ovary syndrome (PCOS).  Being of American-Indian, African-American, Hispanic/Latino, or Asian/Pacific Islander descent. What are the signs or symptoms? In the early stage of this condition, you may not have symptoms. Symptoms develop slowly and may include:  Increased thirst (polydipsia).  Increased hunger(polyphagia).  Increased urination (polyuria).  Increased urination during the night (nocturia).  Unexplained weight loss.  Frequent infections that keep coming back (recurring).  Fatigue.  Weakness.  Vision changes, such as blurry vision.  Cuts or bruises that are slow to heal.  Tingling or numbness in the hands or feet.  Dark patches on the skin (acanthosis nigricans). How is this diagnosed? This condition is diagnosed based on your symptoms, your medical history, a physical exam, and your blood glucose level. Your blood glucose may be checked with one or more of the following blood tests:  A fasting blood glucose (FBG)  test. You will not be allowed to eat (you will fast) for 8 hours or longer before a blood sample is taken.  A random blood glucose test. This test checks blood glucose at any time of day regardless of when you ate.  An A1c (hemoglobin A1c) blood test. This test provides information about blood glucose control over the previous 2-3 months.  An oral glucose tolerance test (OGTT). This test measures your blood glucose at two times: ? After fasting. This is your baseline blood glucose level. ? Two hours after drinking a beverage that contains glucose. You may be diagnosed with type 2 diabetes if:  Your FBG level is 126 mg/dL (7.0 mmol/L) or higher.  Your random blood glucose level is 200 mg/dL (11.1 mmol/L) or higher.  Your A1c level is 6.5% or higher.  Your OGTT result is higher than 200 mg/dL (11.1 mmol/L). These blood tests may be repeated to confirm your diagnosis. How is this treated? Your treatment may be managed by a specialist called an endocrinologist. Type 2 diabetes may be treated by following instructions from your health care provider about:  Making diet and lifestyle changes. This may include: ? Following an individualized nutrition plan that is developed by a diet and nutrition specialist (registered dietitian). ? Exercising regularly. ? Finding ways to manage stress.  Checking your blood glucose level as often as told.  Taking diabetes medicines or insulin daily. This helps to keep your blood glucose levels in the healthy range. ? If you use insulin, you may need to adjust the dosage depending on how physically active you are and what foods you eat. Your health care provider will tell you how to adjust your dosage.    Taking medicines to help prevent complications from diabetes, such as: ? Aspirin. ? Medicine to lower cholesterol. ? Medicine to control blood pressure. Your health care provider will set individualized treatment goals for you. Your goals will be based on  your age, other medical conditions you have, and how you respond to diabetes treatment. Generally, the goal of treatment is to maintain the following blood glucose levels:  Before meals (preprandial): 80-130 mg/dL (4.4-7.2 mmol/L).  After meals (postprandial): below 180 mg/dL (10 mmol/L).  A1c level: less than 7%. Follow these instructions at home: Questions to ask your health care provider  Consider asking the following questions: ? Do I need to meet with a diabetes educator? ? Where can I find a support group for people with diabetes? ? What equipment will I need to manage my diabetes at home? ? What diabetes medicines do I need, and when should I take them? ? How often do I need to check my blood glucose? ? What number can I call if I have questions? ? When is my next appointment? General instructions  Take over-the-counter and prescription medicines only as told by your health care provider.  Keep all follow-up visits as told by your health care provider. This is important.  For more information about diabetes, visit: ? American Diabetes Association (ADA): www.diabetes.org ? American Association of Diabetes Educators (AADE): www.diabeteseducator.org Contact a health care provider if:  Your blood glucose is at or above 240 mg/dL (13.3 mmol/L) for 2 days in a row.  You have been sick or have had a fever for 2 days or longer, and you are not getting better.  You have any of the following problems for more than 6 hours: ? You cannot eat or drink. ? You have nausea and vomiting. ? You have diarrhea. Get help right away if:  Your blood glucose is lower than 54 mg/dL (3.0 mmol/L).  You become confused or you have trouble thinking clearly.  You have difficulty breathing.  You have moderate or large ketone levels in your urine. Summary  Type 2 diabetes (type 2 diabetes mellitus) is a long-term (chronic) disease. In type 2 diabetes, the pancreas does not make enough of a  hormone called insulin, or cells in the body do not respond properly to insulin that the body makes (insulin resistance).  This condition is treated by making diet and lifestyle changes and taking diabetes medicines or insulin.  Your health care provider will set individualized treatment goals for you. Your goals will be based on your age, other medical conditions you have, and how you respond to diabetes treatment.  Keep all follow-up visits as told by your health care provider. This is important. This information is not intended to replace advice given to you by your health care provider. Make sure you discuss any questions you have with your health care provider. Document Released: 11/16/2005 Document Revised: 06/17/2017 Document Reviewed: 12/20/2015 Elsevier Interactive Patient Education  2019 Elsevier Inc.  

## 2018-11-18 ENCOUNTER — Ambulatory Visit: Payer: Medicare Other | Admitting: Family Medicine

## 2018-11-19 ENCOUNTER — Encounter: Payer: Self-pay | Admitting: Family Medicine

## 2018-11-19 ENCOUNTER — Ambulatory Visit: Payer: Medicare Other | Admitting: Family Medicine

## 2018-11-19 VITALS — BP 120/72 | HR 54 | Temp 98.0°F | Resp 16 | Wt 225.0 lb

## 2018-11-19 DIAGNOSIS — R059 Cough, unspecified: Secondary | ICD-10-CM

## 2018-11-19 DIAGNOSIS — R05 Cough: Secondary | ICD-10-CM

## 2018-11-19 MED ORDER — PREDNISONE 20 MG PO TABS: 20.0000 mg | ORAL_TABLET | Freq: Every day | ORAL | 0 refills | Status: DC

## 2018-11-19 NOTE — Assessment & Plan Note (Signed)
Likely viral in nature. His wife has been diagnosed with bronchitis.  - counseled on supportive care - prednisone to have on hand if his coughing doesn't improve. Hgb A1c 7.9.  - given indications to follow up.

## 2018-11-19 NOTE — Patient Instructions (Signed)
Nice to meet you  Please try things such as zyrtec-D or allegra-D which is an antihistamine and decongestant.   Please try afrin which will help with nasal congestion but use for only three days.   Honey, vick's vapor rub, humidifier can all help with cough.   Please take the prednisone if you feel like your symptoms are ongoing and not improving over the next couple of days. Marland Kitchen

## 2018-11-19 NOTE — Progress Notes (Signed)
Ryan Duke - 77 y.o. male MRN [id number]  Date of birth: 03/16/1941  SUBJECTIVE:  Including CC & ROS.  No chief complaint on file.   Ryan Duke is a 77 y.o. male that is presenting with cough and rhinorrhea.  His symptoms been ongoing for 3 days.  He has been exposed to his wife with similar symptoms.  Denies any fevers.  Symptoms are worse at night.  Denies any history of tobacco use or asthma.  No COPD.  Has minimal production with a cough.  Nonbloody.  Feels like his symptoms are getting worse.   Review of Systems  Constitutional: Negative for fever.  HENT: Positive for rhinorrhea.   Respiratory: Positive for cough.   Cardiovascular: Negative for chest pain.    HISTORY: Past Medical, Surgical, Social, and Family History Reviewed & Updated per EMR.   Pertinent Historical Findings include:  Past Medical History:  Diagnosis Date  . Diabetes mellitus    type 2  . Hyperlipidemia   . Hypertension   . Hypogonadism, male   . Pancreatitis 2008  . Renal insufficiency     Past Surgical History:  Procedure Laterality Date  . FLEXIBLE SIGMOIDOSCOPY N/A 09/10/2017   Procedure: FLEXIBLE SIGMOIDOSCOPY;  Surgeon: Mauri Pole, MD;  Location: [facility];  Service: Endoscopy;  Laterality: N/A;  . IR KYPHO LUMBAR INC FX REDUCE BONE BX UNI/BIL CANNULATION INC/IMAGING  09/14/2017  . IR RADIOLOGIST EVAL & MGMT  10/07/2017    Allergies  Allergen Reactions  . Fentanyl     Other reaction(s): Mental Status Changes (intolerance) patch  . Wilder Glade [Dapagliflozin] Other (See Comments)    gout  . Metformin And Related Other (See Comments)    acidosis  . Atorvastatin Other (See Comments)    REACTION: h/a  . Benazepril Hcl Other (See Comments)    Was not effective  . Flexeril [Cyclobenzaprine] Other (See Comments)    HALLUSINATIONS  . Oxycodone Other (See Comments)    HALLUSINATIONS    Family History  Problem Relation Age of Onset  . Hypertension Other      Social  History   Socioeconomic History  . Marital status: Married    Spouse name: Not on file  . Number of children: Not on file  . Years of education: Not on file  . Highest education level: Not on file  Occupational History  . Occupation: retired  Scientific laboratory technician  . Financial resource strain: Not on file  . Food insecurity:    Worry: Not on file    Inability: Not on file  . Transportation needs:    Medical: Not on file    Non-medical: Not on file  Tobacco Use  . Smoking status: Former Smoker    Last attempt to quit: 11/30/1974    Years since quitting: 44.0  . Smokeless tobacco: Never Used  Substance and Sexual Activity  . Alcohol use: Not Currently    Alcohol/week: 0.0 standard drinks  . Drug use: No  . Sexual activity: Yes  Lifestyle  . Physical activity:    Days per week: Not on file    Minutes per session: Not on file  . Stress: Not on file  Relationships  . Social connections:    Talks on phone: Not on file    Gets together: Not on file    Attends religious service: Not on file    Active member of club or organization: Not on file    Attends meetings of clubs or organizations: Not  on file    Relationship status: Not on file  . Intimate partner violence:    Fear of current or ex partner: Not on file    Emotionally abused: Not on file    Physically abused: Not on file    Forced sexual activity: Not on file  Other Topics Concern  . Not on file  Social History Narrative   Regular exercise-yes     PHYSICAL EXAM:  VS: BP 120/72   Pulse (!) 54   Temp 98 F (36.7 C) (Oral)   Resp 16   Wt 225 lb (102.1 kg)   SpO2 98%   BMI 31.38 kg/m  Physical Exam Gen: NAD, alert, cooperative with exam, well-appearing ENT: normal lips, normal nasal mucosa, tympanic membranes clear and intact bilaterally, normal oropharynx, no cervical lymphadenopathy Eye: normal EOM, normal conjunctiva and lids CV:  no edema, +2 pedal pulses, regular rate and rhythm, S1-S2   Resp: no accessory  muscle use, non-labored, clear to auscultation bilaterally, no crackles or wheezes Skin: no rashes, no areas of induration  Neuro: normal tone, normal sensation to touch Psych:  normal insight, alert and oriented MSK: Normal gait, normal strength       ASSESSMENT & PLAN:   Cough Likely viral in nature. His wife has been diagnosed with bronchitis.  - counseled on supportive care - prednisone to have on hand if his coughing doesn't improve. Hgb A1c 7.9.  - given indications to follow up.

## 2018-11-24 ENCOUNTER — Ambulatory Visit: Payer: Medicare Other | Admitting: Internal Medicine

## 2018-11-24 ENCOUNTER — Other Ambulatory Visit: Payer: Self-pay | Admitting: Internal Medicine

## 2018-11-24 DIAGNOSIS — F32A Depression, unspecified: Secondary | ICD-10-CM

## 2018-11-24 DIAGNOSIS — F329 Major depressive disorder, single episode, unspecified: Secondary | ICD-10-CM

## 2019-01-18 NOTE — Progress Notes (Addendum)
Subjective:   Ryan Duke is a 78 y.o. male who presents for Medicare Annual/Subsequent preventive examination.  Review of Systems:  No ROS.  Medicare Wellness Visit. Additional risk factors are reflected in the social history.  Cardiac Risk Factors include: advanced age (>82men, >63 women);diabetes mellitus;male gender Sleep patterns: feels rested on waking, gets up 1 times nightly to void and sleeps 7 hours nightly.    Home Safety/Smoke Alarms: Feels safe in home. Smoke alarms in place.  Living environment; residence and Firearm Safety: 2-story house. Lives with wife, no needs for DME, good support system Seat Belt Safety/Bike Helmet: Wears seat belt.   PSA-  Lab Results  Component Value Date   PSA 1.05 10/28/2015   PSA 0.68 10/22/2014   PSA 0.71 10/17/2012       Objective:    Vitals: BP 126/74   Pulse (!) 56   Resp 17   Ht 5\' 11"  (1.803 m)   Wt 229 lb (103.9 kg)   SpO2 98%   BMI 31.94 kg/m   Body mass index is 31.94 kg/m.  Advanced Directives 01/19/2019 09/10/2017 09/05/2017 09/05/2017 11/15/2016 10/28/2015  Does Patient Have a Medical Advance Directive? Yes Yes Yes Yes Yes Yes  Type of Paramedic of [place];Living will Healthcare Power of [place] of [place] of [place];Living will [place];Living will  Does patient want to make changes to medical advance directive? - - No - Patient declined - - No - Patient declined  Copy of [place] in Chart? No - copy requested No - copy requested No - copy requested - - Yes    Tobacco Social History   Tobacco Use  Smoking Status Former Smoker  . Last attempt to quit: 11/30/1974  . Years since quitting: 44.1  Smokeless Tobacco Never Used     Counseling given: Not Answered  Past Medical History:  Diagnosis Date  . Diabetes mellitus    type 2  . Hyperlipidemia   . Hypertension   .  Hypogonadism, male   . Pancreatitis 2008  . Renal insufficiency    Past Surgical History:  Procedure Laterality Date  . EYE SURGERY     cataracts  . FLEXIBLE SIGMOIDOSCOPY N/A 09/10/2017   Procedure: FLEXIBLE SIGMOIDOSCOPY;  Surgeon: Mauri Pole, MD;  Location: [facility];  Service: Endoscopy;  Laterality: N/A;  . IR KYPHO LUMBAR INC FX REDUCE BONE BX UNI/BIL CANNULATION INC/IMAGING  09/14/2017  . IR RADIOLOGIST EVAL & MGMT  10/07/2017   Family History  Problem Relation Age of Onset  . Hypertension Other    Social History   Socioeconomic History  . Marital status: Married    Spouse name: Not on file  . Number of children: Not on file  . Years of education: Not on file  . Highest education level: Not on file  Occupational History  . Occupation: retired  Scientific laboratory technician  . Financial resource strain: Not hard at all  . Food insecurity:    Worry: Never true    Inability: Never true  . Transportation needs:    Medical: No    Non-medical: No  Tobacco Use  . Smoking status: Former Smoker    Last attempt to quit: 11/30/1974    Years since quitting: 44.1  . Smokeless tobacco: Never Used  Substance and Sexual Activity  . Alcohol use: Not Currently    Alcohol/week: 0.0 standard drinks  . Drug use: No  .  Sexual activity: Yes  Lifestyle  . Physical activity:    Days per week: 6 days    Minutes per session: 30 min  . Stress: Not at all  Relationships  . Social connections:    Talks on phone: More than three times a week    Gets together: More than three times a week    Attends religious service: 1 to 4 times per year    Active member of club or organization: No    Attends meetings of clubs or organizations: Never    Relationship status: Married  Other Topics Concern  . Not on file  Social History Narrative   Regular exercise-yes    Outpatient Encounter Medications as of 01/19/2019  Medication Sig  . atenolol-chlorthalidone (TENORETIC) 100-25 MG tablet TAKE 1  TABLET ONCE DAILY.  Marland Kitchen Cholecalciferol 2000 units TABS Take 1 tablet (2,000 Units total) by mouth daily.  . DULoxetine (CYMBALTA) 60 MG capsule TAKE (1) CAPSULE DAILY.  Marland Kitchen losartan (COZAAR) 100 MG tablet TAKE 1 TABLET DAILY FOR HYPERTENSION.  Marland Kitchen pravastatin (PRAVACHOL) 40 MG tablet TAKE 1 TABLET ONCE DAILY.  . sitaGLIPtin (JANUVIA) 100 MG tablet Take 1 tablet (100 mg total) by mouth daily.  . tamsulosin (FLOMAX) 0.4 MG CAPS capsule TAKE (1) CAPSULE DAILY.  Marland Kitchen predniSONE (DELTASONE) 20 MG tablet Take 1 tablet (20 mg total) by mouth daily with breakfast. (Patient not taking: Reported on 01/19/2019)  . [DISCONTINUED] senna (SENOKOT) 8.6 MG TABS tablet Take 1 tablet (8.6 mg total) by mouth at bedtime as needed for mild constipation. (Patient not taking: Reported on 01/19/2019)   No facility-administered encounter medications on file as of 01/19/2019.     Activities of Daily Living In your present state of health, do you have any difficulty performing the following activities: 01/19/2019  Hearing? N  Vision? N  Difficulty concentrating or making decisions? N  Walking or climbing stairs? N  Dressing or bathing? N  Doing errands, shopping? N  Preparing Food and eating ? N  Using the Toilet? N  In the past six months, have you accidently leaked urine? N  Do you have problems with loss of bowel control? N  Managing your Medications? N  Managing your Finances? N  Housekeeping or managing your Housekeeping? N  Some recent data might be hidden    Patient Care Team: Janith Lima, MD as PCP - General Marilynne Halsted, MD as Referring Physician (Ophthalmology)   Assessment:   This is a routine wellness examination for Ryan Duke. Physical assessment deferred to PCP.  Exercise Activities and Dietary recommendations Current Exercise Habits: Home exercise routine, Type of exercise: walking, Time (Minutes): 30, Frequency (Times/Week): 6, Weekly Exercise (Minutes/Week): 180, Intensity: Mild, Exercise  limited by: orthopedic condition(s)  Diet (meal preparation, eat out, water intake, caffeinated beverages, dairy products, fruits and vegetables): in general, a "healthy" diet     Reviewed heart healthy and diabetic diet. Encouraged patient to increase daily water and healthy fluid intake.    Goals    . Patient Stated     Continue to enjoy gardening, be active and enjoy spending time outdoors.        Fall Risk Fall Risk  01/19/2019 11/17/2018 05/26/2018 11/15/2016 10/28/2015  Falls in the past year? 0 0 Yes No No  Number falls in past yr: - 0 1 - -  Injury with Fall? - 0 Yes - -  Follow up - Falls evaluation completed - - -    Depression Screen PHQ 2/9 Scores  01/19/2019 11/17/2018 05/26/2018 11/15/2016  PHQ - 2 Score 0 0 0 2  PHQ- 9 Score 0 0 - 6    Cognitive Function       Ad8 score reviewed for issues:  Issues making decisions: no  Less interest in hobbies / activities: no  Repeats questions, stories (family complaining): no  Trouble using ordinary gadgets (microwave, computer, phone):no  Forgets the month or year: no  Mismanaging finances: no  Remembering appts: no  Daily problems with thinking and/or memory: no Ad8 score is= 0  Immunization History  Administered Date(s) Administered  . Influenza Split 09/16/2011, 10/17/2012  . Influenza Whole 10/07/2006, 09/11/2008, 09/02/2009, 09/08/2010  . Influenza, High Dose Seasonal PF 10/18/2013, 10/28/2015, 08/25/2016, 09/06/2017, 09/14/2018  . Influenza,inj,Quad PF,6+ Mos 08/15/2014  . Pneumococcal Conjugate-13 03/06/2014  . Pneumococcal Polysaccharide-23 06/11/2006, 11/12/2016  . Tdap 05/13/2011  . Zoster 05/29/2011  . Zoster Recombinat (Shingrix) 05/31/2018, 10/20/2018   Screening Tests Health Maintenance  Topic Date Due  . FOOT EXAM  05/27/2019  . OPHTHALMOLOGY EXAM  06/16/2019  . TETANUS/TDAP  05/12/2021  . INFLUENZA VACCINE  Completed  . PNA vac Low Risk Adult  Completed       Plan:     Reviewed  health maintenance screenings with patient today and relevant education, vaccines, and/or referrals were provided.   Continue doing brain stimulating activities (puzzles, reading, adult coloring books, staying active) to keep memory sharp.   Continue to eat heart healthy diet (full of fruits, vegetables, whole grains, lean protein, water--limit salt, fat, and sugar intake) and increase physical activity as tolerated.  I have personally reviewed and noted the following in the patient's chart:   . Medical and social history . Use of alcohol, tobacco or illicit drugs  . Current medications and supplements . Functional ability and status . Nutritional status . Physical activity . Advanced directives . List of other physicians . Vitals . Screenings to include cognitive, depression, and falls . Referrals and appointments  In addition, I have reviewed and discussed with patient certain preventive protocols, quality metrics, and best practice recommendations. A written personalized care plan for preventive services as well as general preventive health recommendations were provided to patient.     Michiel Cowboy, RN  01/19/2019  Medical screening examination/treatment/procedure(s) were performed by non-physician practitioner and as supervising physician I was immediately available for consultation/collaboration. I agree with above. [name], MD

## 2019-01-19 ENCOUNTER — Ambulatory Visit (INDEPENDENT_AMBULATORY_CARE_PROVIDER_SITE_OTHER): Payer: Medicare Other | Admitting: *Deleted

## 2019-01-19 VITALS — BP 126/74 | HR 56 | Resp 17 | Ht 71.0 in | Wt 229.0 lb

## 2019-01-19 DIAGNOSIS — Z0000 Encounter for general adult medical examination without abnormal findings: Secondary | ICD-10-CM | POA: Diagnosis not present

## 2019-01-19 NOTE — Patient Instructions (Addendum)
Continue doing brain stimulating activities (puzzles, reading, adult coloring books, staying active) to keep memory sharp.   Continue to eat heart healthy diet (full of fruits, vegetables, whole grains, lean protein, water--limit salt, fat, and sugar intake) and increase physical activity as tolerated.   Ryan Duke , Thank you for taking time to come for your Medicare Wellness Visit. I appreciate your ongoing commitment to your health goals. Please review the following plan we discussed and let me know if I can assist you in the future.   These are the goals we discussed: Goals    . Patient Stated     Continue to enjoy gardening, be active and enjoy spending time outdoors.        This is a list of the screening recommended for you and due dates:  Health Maintenance  Topic Date Due  . Complete foot exam   05/27/2019  . Eye exam for diabetics  06/16/2019  . Tetanus Vaccine  05/12/2021  . Flu Shot  Completed  . Pneumonia vaccines  Completed    Preventive Care 80 Years and Older, Male Preventive care refers to lifestyle choices and visits with your health care provider that can promote health and wellness. What does preventive care include?   A yearly physical exam. This is also called an annual well check.  Dental exams once or twice a year.  Routine eye exams. Ask your health care provider how often you should have your eyes checked.  Personal lifestyle choices, including: ? Daily care of your teeth and gums. ? Regular physical activity. ? Eating a healthy diet. ? Avoiding tobacco and drug use. ? Limiting alcohol use. ? Practicing safe sex. ? Taking low doses of aspirin every day. ? Taking vitamin and mineral supplements as recommended by your health care provider. What happens during an annual well check? The services and screenings done by your health care provider during your annual well check will depend on your age, overall health, lifestyle risk factors, and family  history of disease. Counseling Your health care provider may ask you questions about your:  Alcohol use.  Tobacco use.  Drug use.  Emotional well-being.  Home and relationship well-being.  Sexual activity.  Eating habits.  History of falls.  Memory and ability to understand (cognition).  Work and work Statistician. Screening You may have the following tests or measurements:  Height, weight, and BMI.  Blood pressure.  Lipid and cholesterol levels. These may be checked every 5 years, or more frequently if you are over 10 years old.  Skin check.  Lung cancer screening. You may have this screening every year starting at age 58 if you have a 30-pack-year history of smoking and currently smoke or have quit within the past 15 years.  Colorectal cancer screening. All adults should have this screening starting at age 34 and continuing until age 60. You will have tests every 1-10 years, depending on your results and the type of screening test. People at increased risk should start screening at an earlier age. Screening tests may include: ? Guaiac-based fecal occult blood testing. ? Fecal immunochemical test (FIT). ? Stool DNA test. ? Virtual colonoscopy. ? Sigmoidoscopy. During this test, a flexible tube with a tiny camera (sigmoidoscope) is used to examine your rectum and lower colon. The sigmoidoscope is inserted through your anus into your rectum and lower colon. ? Colonoscopy. During this test, a long, thin, flexible tube with a tiny camera (colonoscope) is used to examine your entire colon  and rectum.  Prostate cancer screening. Recommendations will vary depending on your family history and other risks.  Hepatitis C blood test.  Hepatitis B blood test.  Sexually transmitted disease (STD) testing.  Diabetes screening. This is done by checking your blood sugar (glucose) after you have not eaten for a while (fasting). You may have this done every 1-3 years.  Abdominal  aortic aneurysm (AAA) screening. You may need this if you are a current or former smoker.  Osteoporosis. You may be screened starting at age 72 if you are at high risk. Talk with your health care provider about your test results, treatment options, and if necessary, the need for more tests. Vaccines Your health care provider may recommend certain vaccines, such as:  Influenza vaccine. This is recommended every year.  Tetanus, diphtheria, and acellular pertussis (Tdap, Td) vaccine. You may need a Td booster every 10 years.  Varicella vaccine. You may need this if you have not been vaccinated.  Zoster vaccine. You may need this after age 34.  Measles, mumps, and rubella (MMR) vaccine. You may need at least one dose of MMR if you were born in 1957 or later. You may also need a second dose.  Pneumococcal 13-valent conjugate (PCV13) vaccine. One dose is recommended after age 12.  Pneumococcal polysaccharide (PPSV23) vaccine. One dose is recommended after age 41.  Meningococcal vaccine. You may need this if you have certain conditions.  Hepatitis A vaccine. You may need this if you have certain conditions or if you travel or work in places where you may be exposed to hepatitis A.  Hepatitis B vaccine. You may need this if you have certain conditions or if you travel or work in places where you may be exposed to hepatitis B.  Haemophilus influenzae type b (Hib) vaccine. You may need this if you have certain risk factors. Talk to your health care provider about which screenings and vaccines you need and how often you need them. This information is not intended to replace advice given to you by your health care provider. Make sure you discuss any questions you have with your health care provider. Document Released: 12/13/2015 Document Revised: 01/06/2018 Document Reviewed: 09/17/2015 Elsevier Interactive Patient Education  2019 [employer].

## 2019-01-24 ENCOUNTER — Encounter (HOSPITAL_COMMUNITY): Payer: Self-pay | Admitting: Emergency Medicine

## 2019-01-24 ENCOUNTER — Emergency Department (HOSPITAL_COMMUNITY): Payer: Medicare Other

## 2019-01-24 ENCOUNTER — Other Ambulatory Visit: Payer: Self-pay

## 2019-01-24 ENCOUNTER — Emergency Department (HOSPITAL_COMMUNITY)
Admission: EM | Admit: 2019-01-24 | Discharge: 2019-01-24 | Disposition: A | Discharge status: Home / Self Care | Payer: Medicare Other | Attending: Emergency Medicine | Admitting: Emergency Medicine

## 2019-01-24 DIAGNOSIS — N183 Chronic kidney disease, stage 3 (moderate): Secondary | ICD-10-CM | POA: Insufficient documentation

## 2019-01-24 DIAGNOSIS — E1122 Type 2 diabetes mellitus with diabetic chronic kidney disease: Secondary | ICD-10-CM | POA: Diagnosis not present

## 2019-01-24 DIAGNOSIS — J4520 Mild intermittent asthma, uncomplicated: Secondary | ICD-10-CM | POA: Insufficient documentation

## 2019-01-24 DIAGNOSIS — Z87891 Personal history of nicotine dependence: Secondary | ICD-10-CM | POA: Diagnosis not present

## 2019-01-24 DIAGNOSIS — Y929 Unspecified place or not applicable: Secondary | ICD-10-CM | POA: Insufficient documentation

## 2019-01-24 DIAGNOSIS — Z79899 Other long term (current) drug therapy: Secondary | ICD-10-CM | POA: Insufficient documentation

## 2019-01-24 DIAGNOSIS — S0990XA Unspecified injury of head, initial encounter: Secondary | ICD-10-CM | POA: Diagnosis not present

## 2019-01-24 DIAGNOSIS — Z7984 Long term (current) use of oral hypoglycemic drugs: Secondary | ICD-10-CM | POA: Insufficient documentation

## 2019-01-24 DIAGNOSIS — Y999 Unspecified external cause status: Secondary | ICD-10-CM | POA: Insufficient documentation

## 2019-01-24 DIAGNOSIS — Y939 Activity, unspecified: Secondary | ICD-10-CM | POA: Diagnosis not present

## 2019-01-24 DIAGNOSIS — S0003XA Contusion of scalp, initial encounter: Secondary | ICD-10-CM | POA: Insufficient documentation

## 2019-01-24 DIAGNOSIS — W19XXXA Unspecified fall, initial encounter: Secondary | ICD-10-CM | POA: Diagnosis not present

## 2019-01-24 DIAGNOSIS — I129 Hypertensive chronic kidney disease with stage 1 through stage 4 chronic kidney disease, or unspecified chronic kidney disease: Secondary | ICD-10-CM | POA: Diagnosis not present

## 2019-01-24 MED ORDER — ONDANSETRON 8 MG PO TBDP: 8.0000 mg | ORAL_TABLET | Freq: Three times a day (TID) | ORAL | 0 refills | Status: DC | PRN

## 2019-01-24 MED ORDER — ONDANSETRON 8 MG PO TBDP
8.0000 mg | ORAL_TABLET | Freq: Once | ORAL | Status: AC
  Administered 2019-01-24: 8 mg via ORAL
  Filled 2019-01-24: qty 1

## 2019-01-24 NOTE — ED Notes (Signed)
Pt was put into a wheelchair and began to dry heave. Dr. Zenia Resides was made aware and given a prescription for zofran.  Pt and family member were reassured and felt more comfortable leaving.

## 2019-01-24 NOTE — ED Triage Notes (Addendum)
Patient c/o fall down three stairs hitting head on cement. Denies LOC. C/o right side pain. Denies taking blood thinners. Reports nausea since fall. Hematoma to posterior head.

## 2019-01-24 NOTE — ED Provider Notes (Signed)
[facility] DEPT Provider Note   CSN: [id number] Arrival date & time: 01/24/19  1939    History   Chief Complaint Chief Complaint  Patient presents with  . Fall    HPI Ryan Duke is a 78 y.o. male.     78 year old male presents after mechanical fall just prior to arrival.  3 stairs and did not have any loss of consciousness.  Complains of occipital pain without neck discomfort.  No chest or abdominal discomfort.  No hip pain.  Does not take any blood thinners.  Has had nausea but no vomiting.  No visual changes.  No treatment use prior to arrival     Past Medical History:  Diagnosis Date  . Diabetes mellitus    type 2  . Hyperlipidemia   . Hypertension   . Hypogonadism, male   . Pancreatitis 2008  . Renal insufficiency     Patient Active Problem List   Diagnosis Date Noted  . Vitamin D deficiency 02/15/2018  . Closed wedge compression fracture of lumbar vertebra with delayed healing 11/01/2017  . Bilateral low back pain with sciatica 11/01/2017  . Constipation   . Mild intermittent asthma without complication 62/69/4854  . Gout 11/17/2016  . Alcoholic hepatitis without ascites 11/12/2016  . Nuclear sclerosis of both eyes 08/21/2016  . Nonexudative age-related macular degeneration 08/21/2016  . Primary gout 07/07/2016  . Depression 09/15/2015  . Erectile dysfunction 03/06/2014  . Nuclear cataract 03/09/2012  . 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 Routine general medical examination at a health care facility 09/18/2011  . Cough 03/04/2011  . GERD 07/22/2010  . BPH (benign prostatic hyperplasia) 09/02/2009  . Kidney disease, chronic, 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) ([place]) 02/26/2009  . Aortic valve disorder 02/25/2009  . PERIPHERAL VASCULAR DISEASE 02/25/2009  . Type II diabetes mellitus with manifestations ([place]) 11/01/2007  . Hyperlipidemia with target LDL less than 100 07/01/2007  . Morbid obesity ([place]) 07/01/2007  .  Essential hypertension 07/01/2007    Past Surgical History:  Procedure Laterality Date  . EYE SURGERY     cataracts  . FLEXIBLE SIGMOIDOSCOPY N/A 09/10/2017   Procedure: FLEXIBLE SIGMOIDOSCOPY;  Surgeon: Mauri Pole, MD;  Location: [facility];  Service: Endoscopy;  Laterality: N/A;  . IR KYPHO LUMBAR INC FX REDUCE BONE BX UNI/BIL CANNULATION INC/IMAGING  09/14/2017  . IR RADIOLOGIST EVAL & MGMT  10/07/2017        Home Medications    Prior to Admission medications   Medication Sig Start Date End Date Taking? Authorizing Provider  atenolol-chlorthalidone (TENORETIC) 100-25 MG tablet TAKE 1 TABLET ONCE DAILY. 11/24/18   Janith Lima, MD  Cholecalciferol 2000 units TABS Take 1 tablet (2,000 Units total) by mouth daily. 02/15/18   Janith Lima, MD  DULoxetine (CYMBALTA) 60 MG capsule TAKE (1) CAPSULE DAILY. 11/24/18   Janith Lima, MD  losartan (COZAAR) 100 MG tablet TAKE 1 TABLET DAILY FOR HYPERTENSION. 11/24/18   Janith Lima, MD  pravastatin (PRAVACHOL) 40 MG tablet TAKE 1 TABLET ONCE DAILY. 11/24/18   Janith Lima, MD  predniSONE (DELTASONE) 20 MG tablet Take 1 tablet (20 mg total) by mouth daily with breakfast. Patient not taking: Reported on 01/19/2019 11/19/18   Rosemarie Ax, MD  sitaGLIPtin (JANUVIA) 100 MG tablet Take 1 tablet (100 mg total) by mouth daily. 11/17/18   Janith Lima, MD  tamsulosin (FLOMAX) 0.4 MG CAPS capsule TAKE (1) CAPSULE DAILY. 11/24/18   [name]  L, MD    Family History Family History  Problem Relation Age of Onset  . Hypertension Other     Social History Social History   Tobacco Use  . Smoking status: Former Smoker    Last attempt to quit: 11/30/1974    Years since quitting: 44.1  . Smokeless tobacco: Never Used  Substance Use Topics  . Alcohol use: Not Currently    Alcohol/week: 0.0 standard drinks  . Drug use: No     Allergies   Fentanyl; Farxiga [dapagliflozin]; Metformin and related; Atorvastatin;  Benazepril hcl; Flexeril [cyclobenzaprine]; and Oxycodone   Review of Systems Review of Systems  All other systems reviewed and are negative.    Physical Exam Updated Vital Signs BP (!) 178/73 (BP Location: Right Arm)   Pulse (!) 50   Temp (!) 97.5 F (36.4 C) (Oral)   Resp 16   SpO2 97%   Physical Exam Vitals signs and nursing note reviewed.  Constitutional:      General: He is not in acute distress.    Appearance: Normal appearance. He is well-developed. He is not toxic-appearing.  HENT:     Head: Normocephalic and atraumatic.   Eyes:     General: Lids are normal.     Conjunctiva/sclera: Conjunctivae normal.     Pupils: Pupils are equal, round, and reactive to light.  Neck:     Musculoskeletal: Normal range of motion and neck supple. No spinous process tenderness or muscular tenderness.     Thyroid: No thyroid mass.     Trachea: No tracheal deviation.  Cardiovascular:     Rate and Rhythm: Normal rate and regular rhythm.     Heart sounds: Normal heart sounds. No murmur. No gallop.   Pulmonary:     Effort: Pulmonary effort is normal. No respiratory distress.     Breath sounds: Normal breath sounds. No stridor. No decreased breath sounds, wheezing, rhonchi or rales.  Abdominal:     General: Bowel sounds are normal. There is no distension.     Palpations: Abdomen is soft.     Tenderness: There is no abdominal tenderness. There is no rebound.  Musculoskeletal: Normal range of motion.        General: No tenderness.  Skin:    General: Skin is warm and dry.     Findings: No abrasion or rash.  Neurological:     Mental Status: He is alert and oriented to person, place, and time.     GCS: GCS eye subscore is 4. GCS verbal subscore is 5. GCS motor subscore is 6.     Cranial Nerves: No cranial nerve deficit.     Sensory: No sensory deficit.     Motor: No weakness or atrophy.  Psychiatric:        Speech: Speech normal.        Behavior: Behavior normal.      ED  Treatments / Results  Labs (all labs ordered are listed, but only abnormal results are displayed) Labs Reviewed - No data to display  EKG None  Radiology No results found.  Procedures Procedures (including critical care time)  Medications Ordered in ED Medications - No data to display   Initial Impression / Assessment and Plan / ED Course  I have reviewed the triage vital signs and the nursing notes.  Pertinent labs & imaging results that were available during my care of the patient were reviewed by me and considered in my medical decision making (see chart for details).  Given Zofran here and head CT negative.  Neurological exam is stable.  Patient stable for discharge  Final Clinical Impressions(s) / ED Diagnoses   Final diagnoses:  None    ED Discharge Orders    None       Lacretia Leigh, MD 01/24/19 2121

## 2019-01-27 ENCOUNTER — Emergency Department (HOSPITAL_COMMUNITY)
Admission: EM | Admit: 2019-01-27 | Discharge: 2019-01-27 | Disposition: A | Discharge status: Home / Self Care | Payer: Medicare Other | Attending: Emergency Medicine | Admitting: Emergency Medicine

## 2019-01-27 ENCOUNTER — Other Ambulatory Visit: Payer: Self-pay

## 2019-01-27 ENCOUNTER — Emergency Department (HOSPITAL_COMMUNITY): Payer: Medicare Other

## 2019-01-27 ENCOUNTER — Encounter (HOSPITAL_COMMUNITY): Payer: Self-pay

## 2019-01-27 ENCOUNTER — Ambulatory Visit: Payer: Self-pay | Admitting: *Deleted

## 2019-01-27 DIAGNOSIS — E119 Type 2 diabetes mellitus without complications: Secondary | ICD-10-CM | POA: Diagnosis not present

## 2019-01-27 DIAGNOSIS — R42 Dizziness and giddiness: Secondary | ICD-10-CM

## 2019-01-27 DIAGNOSIS — Z87891 Personal history of nicotine dependence: Secondary | ICD-10-CM | POA: Diagnosis not present

## 2019-01-27 DIAGNOSIS — Z79899 Other long term (current) drug therapy: Secondary | ICD-10-CM | POA: Diagnosis not present

## 2019-01-27 DIAGNOSIS — I129 Hypertensive chronic kidney disease with stage 1 through stage 4 chronic kidney disease, or unspecified chronic kidney disease: Secondary | ICD-10-CM | POA: Diagnosis not present

## 2019-01-27 DIAGNOSIS — N183 Chronic kidney disease, stage 3 (moderate): Secondary | ICD-10-CM | POA: Diagnosis not present

## 2019-01-27 DIAGNOSIS — Z7984 Long term (current) use of oral hypoglycemic drugs: Secondary | ICD-10-CM | POA: Diagnosis not present

## 2019-01-27 DIAGNOSIS — R9431 Abnormal electrocardiogram [ECG] [EKG]: Secondary | ICD-10-CM | POA: Diagnosis not present

## 2019-01-27 DIAGNOSIS — S0990XA Unspecified injury of head, initial encounter: Secondary | ICD-10-CM | POA: Diagnosis not present

## 2019-01-27 DIAGNOSIS — R112 Nausea with vomiting, unspecified: Secondary | ICD-10-CM | POA: Diagnosis not present

## 2019-01-27 LAB — BASIC METABOLIC PANEL
Anion gap: 8 (ref 5–15)
BUN: 20 mg/dL (ref 8–23)
CO2: 29 mmol/L (ref 22–32)
Calcium: 9 mg/dL (ref 8.9–10.3)
Chloride: 102 mmol/L (ref 98–111)
Creatinine, Ser: 1.31 mg/dL — ABNORMAL HIGH (ref 0.61–1.24)
GFR calc Af Amer: 60 mL/min — ABNORMAL LOW (ref >60)
GFR calc non Af Amer: 51 mL/min — ABNORMAL LOW (ref >60)
Glucose, Bld: 158 mg/dL — ABNORMAL HIGH (ref 70–99)
Potassium: 3.9 mmol/L (ref 3.5–5.1)
SODIUM: 139 mmol/L (ref 135–145)

## 2019-01-27 LAB — CBC
HCT: 44.2 % (ref 39.0–52.0)
Hemoglobin: 14 g/dL (ref 13.0–17.0)
MCH: 30.2 pg (ref 26.0–34.0)
MCHC: 31.7 g/dL (ref 30.0–36.0)
MCV: 95.3 fL (ref 80.0–100.0)
Platelets: 232 10*3/uL (ref 150–400)
RBC: 4.64 MIL/uL (ref 4.22–5.81)
RDW: 13.3 % (ref 11.5–15.5)
WBC: 8.7 10*3/uL (ref 4.0–10.5)
nRBC: 0 % (ref 0.0–0.2)

## 2019-01-27 MED ORDER — MECLIZINE HCL 12.5 MG PO TABS: 12.5000 mg | ORAL_TABLET | Freq: Three times a day (TID) | ORAL | 0 refills | Status: DC | PRN

## 2019-01-27 MED ORDER — LORAZEPAM 2 MG/ML IJ SOLN
0.5000 mg | Freq: Once | INTRAMUSCULAR | Status: AC
  Administered 2019-01-27: 0.5 mg via INTRAVENOUS
  Filled 2019-01-27: qty 1

## 2019-01-27 MED ORDER — SODIUM CHLORIDE 0.9 % IV BOLUS
500.0000 mL | Freq: Once | INTRAVENOUS | Status: AC
  Administered 2019-01-27: 500 mL via INTRAVENOUS

## 2019-01-27 MED ORDER — MECLIZINE HCL 25 MG PO TABS
25.0000 mg | ORAL_TABLET | Freq: Once | ORAL | Status: AC
  Administered 2019-01-27: 25 mg via ORAL
  Filled 2019-01-27: qty 1

## 2019-01-27 MED ORDER — LORAZEPAM 0.5 MG PO TABS: 0.5000 mg | ORAL_TABLET | Freq: Two times a day (BID) | ORAL | 0 refills | Status: DC | PRN

## 2019-01-27 MED ORDER — PROMETHAZINE HCL 25 MG/ML IJ SOLN
12.5000 mg | Freq: Once | INTRAMUSCULAR | Status: AC
  Administered 2019-01-27: 12.5 mg via INTRAVENOUS
  Filled 2019-01-27: qty 1

## 2019-01-27 NOTE — ED Notes (Signed)
Patient walked 100 ft with cane. Patient tolerated walk well.

## 2019-01-27 NOTE — ED Provider Notes (Signed)
[facility] DEPT Provider Note   CSN: [id number] Arrival date & time: 01/27/19  1343    History   Chief Complaint Chief Complaint  Patient presents with  . Emesis  . Dizziness    HPI WIL SLAPE is a 78 y.o. male with a hx of T2DM, HTN, hyperlipidemia, renal insufficiency, prior pancreatitis, and CKD3 who presents to the ED with complaints of N/V & dizziness that began this AM when he woke up (>4.5 hours ago). Patient states that he had a mechanical fall w/ head injury 02/25, no LOC, seen in the ER, negative head CT. States towards end of ER visit that night he began to have dry heaving and emesis which was controlled w/ zofran. He states that the following two days he had some mild nausea and minimal dizziness which seemed controlled with zofran. This AM he woke up feeling okay, but as soon as he sat up in bed he began to have dizziness described as the room spinning with associated nausea & emesis/dry heaves. States dizziness has been intermittent- occurs w/ movement of the head or position changes, if he remains still this initially seemed to resolve, but now he is having some dizziness with remaining still. He has had a total of 4 episodes of vomiting total. State head remains sore, but no acute worsened headache or thunderclap sensation. Denies change in vision, lightheadedness, syncope, numbness, weakness, chest pain, or dyspnea. No subsequent head injury since last ER visit. Has had some mild congestion but no other URI sxs.     HPI  Past Medical History:  Diagnosis Date  . Diabetes mellitus    type 2  . Hyperlipidemia   . Hypertension   . Hypogonadism, male   . Pancreatitis 2008  . Renal insufficiency     Patient Active Problem List   Diagnosis Date Noted  . Vitamin D deficiency 02/15/2018  . Closed wedge compression fracture of lumbar vertebra with delayed healing 11/01/2017  . Bilateral low back pain with sciatica 11/01/2017  .  Constipation   . Mild intermittent asthma without complication 97/12/6376  . Gout 11/17/2016  . Alcoholic hepatitis without ascites 11/12/2016  . Nuclear sclerosis of both eyes 08/21/2016  . Nonexudative age-related macular degeneration 08/21/2016  . Primary gout 07/07/2016  . Depression 09/15/2015  . Erectile dysfunction 03/06/2014  . Nuclear cataract 03/09/2012  . 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 Routine general medical examination at a health care facility 09/18/2011  . Cough 03/04/2011  . GERD 07/22/2010  . BPH (benign prostatic hyperplasia) 09/02/2009  . Kidney disease, chronic, 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) ([place]) 02/26/2009  . Aortic valve disorder 02/25/2009  . PERIPHERAL VASCULAR DISEASE 02/25/2009  . Type II diabetes mellitus with manifestations ([place]) 11/01/2007  . Hyperlipidemia with target LDL less than 100 07/01/2007  . Morbid obesity ([place]) 07/01/2007  . Essential hypertension 07/01/2007    Past Surgical History:  Procedure Laterality Date  . EYE SURGERY     cataracts  . FLEXIBLE SIGMOIDOSCOPY N/A 09/10/2017   Procedure: FLEXIBLE SIGMOIDOSCOPY;  Surgeon: Mauri Pole, MD;  Location: [facility] END[facility]COPY;  Service: Endoscopy;  Laterality: N/A;  . IR KYPHO LUMBAR INC FX REDUCE BONE BX UNI/BIL CANNULATION INC/IMAGING  09/14/2017  . IR RADIOLOGIST EVAL & MGMT  10/07/2017        Home Medications    Prior to Admission medications   Medication Sig Start Date End Date Taking? Authorizing Provider  atenolol-chlorthalidone (TENORETIC) 100-25 MG tablet TAKE 1 TABLET  ONCE DAILY. Patient taking differently: Take 1 tablet by mouth daily.  11/24/18   Janith Lima, MD  Cholecalciferol 2000 units TABS Take 1 tablet (2,000 Units total) by mouth daily. 02/15/18   Janith Lima, MD  DULoxetine (CYMBALTA) 60 MG capsule TAKE (1) CAPSULE DAILY. Patient taking differently: Take 60 mg by mouth daily.  11/24/18   Janith Lima, MD  losartan (COZAAR) 100 MG tablet TAKE 1  TABLET DAILY FOR HYPERTENSION. Patient taking differently: Take 100 mg by mouth daily.  11/24/18   Janith Lima, MD  Multiple Vitamin (MULTIVITAMIN WITH MINERALS) TABS tablet Take 1 tablet by mouth daily.    [provider]  ondansetron (ZOFRAN ODT) 8 MG disintegrating tablet Take 1 tablet (8 mg total) by mouth every 8 (eight) hours as needed for nausea or vomiting. 01/24/19   Lacretia Leigh, MD  pravastatin (PRAVACHOL) 40 MG tablet TAKE 1 TABLET ONCE DAILY. Patient taking differently: Take 40 mg by mouth daily.  11/24/18   Janith Lima, MD  predniSONE (DELTASONE) 20 MG tablet Take 1 tablet (20 mg total) by mouth daily with breakfast. Patient not taking: Reported on 01/19/2019 11/19/18   Rosemarie Ax, MD  sitaGLIPtin (JANUVIA) 100 MG tablet Take 1 tablet (100 mg total) by mouth daily. 11/17/18   Janith Lima, MD  tamsulosin (FLOMAX) 0.4 MG CAPS capsule TAKE (1) CAPSULE DAILY. Patient taking differently: Take 0.4 mg by mouth daily.  11/24/18   Janith Lima, MD    Family History Family History  Problem Relation Age of Onset  . Hypertension Other     Social History Social History   Tobacco Use  . Smoking status: Former Smoker    Last attempt to quit: 11/30/1974    Years since quitting: 44.1  . Smokeless tobacco: Never Used  Substance Use Topics  . Alcohol use: Not Currently    Alcohol/week: 0.0 standard drinks  . Drug use: No     Allergies   Fentanyl; Farxiga [dapagliflozin]; Metformin and related; Atorvastatin; Benazepril hcl; Flexeril [cyclobenzaprine]; and Oxycodone   Review of Systems Review of Systems  Constitutional: Negative for chills and fever.  Respiratory: Negative for shortness of breath.   Cardiovascular: Negative for chest pain.  Gastrointestinal: Positive for nausea and vomiting. Negative for abdominal pain, blood in stool, constipation and diarrhea.  Neurological: Positive for dizziness. Negative for seizures, syncope, facial asymmetry,  speech difficulty, weakness, light-headedness, numbness and headaches.  All other systems reviewed and are negative.  Physical Exam Updated Vital Signs BP (!) 172/69   Pulse (!) 53   Temp 97.6 F (36.4 C) (Oral)   Resp 15   Ht 5\' 11"  (1.803 m)   Wt 102.5 kg   SpO2 98%   BMI 31.52 kg/m   Physical Exam Vitals signs and nursing note reviewed.  Constitutional:      General: He is not in acute distress.    Appearance: He is well-developed. He is not toxic-appearing.  HENT:     Head: Normocephalic and atraumatic.     Right Ear: Tympanic membrane, ear canal and external ear normal.     Left Ear: Tympanic membrane, ear canal and external ear normal.     Nose: Nose normal.     Mouth/Throat:     Mouth: Mucous membranes are moist.     Comments: Uvula midline.  Eyes:     General:        Right eye: No discharge.  Left eye: No discharge.     Extraocular Movements: Extraocular movements intact.     Conjunctiva/sclera: Conjunctivae normal.     Pupils: Pupils are equal, round, and reactive to light.     Comments: Horizontal nystagmus to the L with EOMI, no vertical nystagmus noted. Negative test of skew.   Neck:     Musculoskeletal: Neck supple.  Cardiovascular:     Rate and Rhythm: Normal rate and regular rhythm.  Pulmonary:     Effort: Pulmonary effort is normal. No respiratory distress.     Breath sounds: Normal breath sounds. No wheezing, rhonchi or rales.  Abdominal:     General: There is no distension.     Palpations: Abdomen is soft.     Tenderness: There is no abdominal tenderness. There is no guarding or rebound.  Skin:    General: Skin is warm and dry.     Capillary Refill: Capillary refill takes less than 2 seconds.     Findings: No rash.  Neurological:     Mental Status: He is alert.     Comments: Alert. Clear speech. No facial droop. CNIII-XII grossly intact. Bilateral upper and lower extremities' sensation grossly intact. 5/5 symmetric strength with grip  strength and with plantar and dorsi flexion bilaterally . Normal finger to nose bilaterally. Negative pronator drift. A bit unsteady with romberg.   Psychiatric:        Behavior: Behavior normal.    ED Treatments / Results  Labs (all labs ordered are listed, but only abnormal results are displayed) Labs Reviewed  BASIC METABOLIC PANEL - Abnormal; Notable for the following components:      Result Value   Glucose, Bld 158 (*)    Creatinine, Ser 1.31 (*)    GFR calc non Af Amer 51 (*)    GFR calc Af Amer 60 (*)    All other components within normal limits  CBC    EKG EKG Interpretation  Date/Time:  Friday January 27 2019 19:30:38 EST Ventricular Rate:  50 PR Interval:    QRS Duration: 98 QT Interval:  463 QTC Calculation: 423 R Axis:   -41 Text Interpretation:  Sinus rhythm Abnormal R-wave progression, early transition LVH with secondary repolarization abnormality Confirmed by Gerlene Fee [phone]) on 01/27/2019 7:37:14 PM   Radiology Mr Brain Wo Contrast (neuro Protocol)  Result Date: 01/27/2019 CLINICAL DATA:  Recent fall.  Vertigo.  Nausea. EXAM: MRI HEAD WITHOUT CONTRAST TECHNIQUE: Multiplanar, multiecho pulse sequences of the brain and surrounding structures were obtained without intravenous contrast. COMPARISON:  Head CT 3 days ago.  MRI 06/17/2013 FINDINGS: Brain: Diffusion imaging does not show any acute or subacute infarction. Mild chronic small-vessel ischemic change affects the pons. Few old small vessel cerebellar infarctions. Cerebral hemispheres show age related atrophy with moderate chronic small-vessel ischemic changes of the deep and subcortical white matter. No cortical or large vessel territory infarction. No mass lesion, hemorrhage, hydrocephalus or extra-axial collection. Improving right posterior parietal scalp hematoma. Vascular: Major vessels at the base of the brain show flow. Skull and upper cervical spine: Negative Sinuses/Orbits: Sinuses are clear. Orbits  are negative. No fluid seen in the middle ears or mastoids. Other: None IMPRESSION: No acute or traumatic intracranial finding. Atrophy and moderate chronic small-vessel ischemic changes as outlined above. Electronically Signed   By: Nelson Chimes M.D.   On: 01/27/2019 17:26    Procedures Procedures (including critical care time)  Medications Ordered in ED Medications - No data to display   Initial  Impression / Assessment and Plan / ED Course  I have reviewed the triage vital signs and the nursing notes.  Pertinent labs & imaging results that were available during my care of the patient were reviewed by me and considered in my medical decision making (see chart for details).   Patient presents to the emergency department with dizziness and associated nausea and vomiting.  Vitals w/ mildly elevated BP and mild bradycardia consistent w/ prior visits. Symptoms seem to be primarily with position changes and with movement, however he now mentions that he does have some symptoms when he is remaining completely still.  Mild horizontal nystagmus to the left with extraocular movements.  No focal neurologic deficits on exam, mildly unsteady with Romberg.  Suspicious for a peripheral vertigo, however given symptoms do occur with staying still central vertigo cannot necessarily be ruled out.  He is greater than 4.5 hours out from onset of symptoms and does not have findings consistent with LVO, therefore not a candidate for code stroke. Discussed findings with supervising physician Dr. Wilson Singer who is evaluated the patient, plan for trial of meclizine, basic labs, EKG and reevaluation of symptoms.  If no significant provement will likely need MRI.   15:45: RE-EVAL: NO change since meclizine, having dizziness at rest/staying still, discussed with Dr. Wilson Singer- recommends ativan, phenergan, and MRI brain wo contrast which has been ordered.   CBC: Unremarkable.  BMP: Renal function at baseline. Mild hyperglycemia at  158. No electrolyte disturbance. MRI:  No acute or traumatic intracranial finding. EKG: No significant change from prior.   Patient is feeling much better following medications in the emergency department.  He is tolerating p.o.  He is ambulatory with his cane that he uses at baseline without difficulty.  States he is ready for discharge at this time. Will discharge home with PCP follow up & a few tablets of meclizine & ativan- printed controlled substance due to unknown if their pharmacy is open. I discussed results, treatment plan, need for follow-up, and return precautions with the patient & his wife at bedside. Provided opportunity for questions, patient & his wife confirmed understanding and are in agreement with plan.   This is a shared visit with supervising physician Dr. Wilson Singer who has independently evaluated patient & provided guidance in evaluation/management/disposition, in agreement with care    Final Clinical Impressions(s) / ED Diagnoses   Final diagnoses:  Dizziness    ED Discharge Orders         Ordered    meclizine (ANTIVERT) 12.5 MG tablet  3 times daily PRN     01/27/19 2001    LORazepam (ATIVAN) 0.5 MG tablet  2 times daily PRN     01/27/19 [address] 01/27/19 2008    Virgel Manifold, MD 01/29/19 1433

## 2019-01-27 NOTE — ED Triage Notes (Signed)
Patient states he fell down some steps 3 days ago and is now having vomiting and dizziness.

## 2019-01-27 NOTE — Discharge Instructions (Signed)
You are seen in the emergency department today for dizziness with nausea and vomiting.  Your labs were similar to prior labs you have had done.  Your MRI did not show any acute injury or signs of an acute stroke.  We suspect your symptoms are related to vertigo.  We are sending you home with meclizine and Ativan to take as needed for dizziness.  Please take these as prescribed.  We have prescribed you new medication(s) today. Discuss the medications prescribed today with your pharmacist as they can have adverse effects and interactions with your other medicines including over the counter and prescribed medications. Seek medical evaluation if you start to experience new or abnormal symptoms after taking one of these medicines, seek care immediately if you start to experience difficulty breathing, feeling of your throat closing, facial swelling, or rash as these could be indications of a more serious allergic reaction  Do not drink alcohol or consume other sedating medicines been taking either of these medicines.  Do not drive or operate heavy machinery.  Follow-up with primary care within 3 days.  Return to the ER for new or worsening symptoms including but not limited to dizziness not relieved by medicines, numbness, weakness, headache, loss of conscious, inability to keep fluids down, or any other concerns.

## 2019-01-27 NOTE — Telephone Encounter (Signed)
Pt fell on 01/24/2019 and was diagnosed with a concussion; this morning he started having vertigo; an hour later he had nausea and emesis x 1 episode; his vertigo and nausea remain even with ondansetron; recommendations made per nurse triage protocol; the and his wife verbalize under- Standing, and will proceed to ED; the pt normally sees Dr Ronnald Ramp, [place]; will route to office for notification of this encounter.  Reason for Disposition . Concussion symptoms are worsening . Vomiting once or more  Answer Assessment - Initial Assessment Questions 1. SYMPTOMS: "What symptom are you most concerned about?"     Vertigo, nausea, vomiting x 1 2. OTHER SYMPTOMS: "Do you have any other symptoms?" (e.g., nausea, difficulty concentrating)     Nausea vomiting 3. ONSET / PATTERN:  "Are you the same, getting better, or getting worse?"  "What's changed?"     new 4. HEADACHE: "Do you have any headache pain?" If so, ask: "How bad is it?"  (Scale 1-10; or mild, moderate, severe)   - MILD - Doesn't interfere with normal activities   - MODERATE - Interferes with normal activities (e.g., work or school) or awakens from sleep   - SEVERE - Excruciating pain, unable to do any normal activities     no 5. mTBI: "When did your head injury happen?" "How did it occur?"      3/35/3030 6. mTBI DIAGNOSIS:  "Who diagnosed your concussion?"     Hospital ED 7. mTBI TESTING: "Did you have a CT scan or MRI of your head?"     CT 8. mTBI LAST VISIT:  "When were you seen for your head injury?"     01/24/2019 9. MEDICATIONS:  "Did you receive any prescription meds?"  If so, ask:  "What are they?" " Were any OTC meds recommended?"     zofran 10. FOLLOW-UP APPT:  "Do you have a follow-up appointment?"       no  Answer Assessment - Initial Assessment Questions 1. MECHANISM: "How did the injury happen?" For falls, ask: "What height did you fall from?" and "What surface did you fall against?"      Pt fell down stairs and hit  head 2. ONSET: "When did the injury happen?" (Minutes or hours ago)      01/24/2019; seen in ED 3. NEUROLOGIC SYMPTOMS: "Was there any loss of consciousness?" "Are there any other neurological symptoms?"      vertgo 4. MENTAL STATUS: "Does the person know who he is, who you are, and where he is?"      yes 5. LOCATION: "What part of the head was hit?"      occiptial 6. SCALP APPEARANCE: "What does the scalp look like? Is it bleeding now?" If so, ask: "Is it difficult to stop?"      no 7. SIZE: For cuts, bruises, or swelling, ask: "How large is it?" (e.g., inches or centimeters)      8. PAIN: "Is there any pain?" If so, ask: "How bad is it?"  (e.g., Scale 1-10; or mild, moderate, severe)     n/a 9. TETANUS: For any breaks in the skin, ask: "When was the last tetanus booster?"     n/a 10. OTHER SYMPTOMS: "Do you have any other symptoms?" (e.g., neck pain, vomiting)       Vomiting; vertigo 11. PREGNANCY: "Is there any chance you are pregnant?" "When was your last menstrual period?"       n/a  Protocols used: CONCUSSION ([facility]BI) LESS THAN 14 DAYS AGO  FOLLOW-UP CALL-A-AH, HEAD INJURY-A-AH

## 2019-02-07 ENCOUNTER — Encounter: Payer: Self-pay | Admitting: Internal Medicine

## 2019-02-07 ENCOUNTER — Ambulatory Visit (INDEPENDENT_AMBULATORY_CARE_PROVIDER_SITE_OTHER): Payer: Medicare Other | Admitting: Internal Medicine

## 2019-02-07 VITALS — BP 144/72 | HR 65 | Temp 98.1°F | Resp 16 | Ht 71.0 in | Wt 227.0 lb

## 2019-02-07 DIAGNOSIS — E118 Type 2 diabetes mellitus with unspecified complications: Secondary | ICD-10-CM

## 2019-02-07 DIAGNOSIS — I10 Essential (primary) hypertension: Secondary | ICD-10-CM | POA: Diagnosis not present

## 2019-02-07 NOTE — Progress Notes (Signed)
Subjective:  Patient ID: Ryan Duke, male    DOB: 1941-02-14  Age: 78 y.o. MRN: [id number]  CC: Hypertension and Diabetes   HPI Ryan Duke presents for f/up -he was in his home 2 weeks ago when he got tripped up by 1 of his dogs and fell.  He was seen in the ED and scans of the brain were negative.  For about a week he had postoncussive symptoms that included nausea and dry heaves.  He tells me those symptoms have resolved.  He complains of chronic fatigue but otherwise offers no complaints today.  Outpatient Medications Prior to Visit  Medication Sig Dispense Refill  . acetaminophen (TYLENOL) 500 MG tablet Take 1,000 mg by mouth 2 (two) times daily as needed for moderate pain.    Marland Kitchen atenolol-chlorthalidone (TENORETIC) 100-25 MG tablet TAKE 1 TABLET ONCE DAILY. (Patient taking differently: Take 1 tablet by mouth daily. ) 90 tablet 1  . DULoxetine (CYMBALTA) 60 MG capsule TAKE (1) CAPSULE DAILY. (Patient taking differently: Take 60 mg by mouth daily. ) 90 capsule 1  . ibuprofen (ADVIL,MOTRIN) 200 MG tablet Take 400 mg by mouth every 6 (six) hours as needed for moderate pain.    Marland Kitchen LORazepam (ATIVAN) 0.5 MG tablet Take 1 tablet (0.5 mg total) by mouth 2 (two) times daily as needed (Dizziness). 5 tablet 0  . losartan (COZAAR) 100 MG tablet TAKE 1 TABLET DAILY FOR HYPERTENSION. (Patient taking differently: Take 100 mg by mouth daily. ) 90 tablet 1  . meclizine (ANTIVERT) 12.5 MG tablet Take 1 tablet (12.5 mg total) by mouth 3 (three) times daily as needed for dizziness. 8 tablet 0  . Multiple Vitamin (MULTIVITAMIN WITH MINERALS) TABS tablet Take 1 tablet by mouth 2 (two) times a week.     . ondansetron (ZOFRAN ODT) 8 MG disintegrating tablet Take 1 tablet (8 mg total) by mouth every 8 (eight) hours as needed for nausea or vomiting. 20 tablet 0  . pravastatin (PRAVACHOL) 40 MG tablet TAKE 1 TABLET ONCE DAILY. (Patient taking differently: Take 40 mg by mouth daily. ) 90 tablet 1  .  sitaGLIPtin (JANUVIA) 100 MG tablet Take 1 tablet (100 mg total) by mouth daily. 90 tablet 1  . tamsulosin (FLOMAX) 0.4 MG CAPS capsule TAKE (1) CAPSULE DAILY. (Patient taking differently: Take 0.4 mg by mouth daily. ) 90 capsule 1   No facility-administered medications prior to visit.     ROS Review of Systems  Constitutional: Positive for fatigue. Negative for chills, diaphoresis and unexpected weight change.  HENT: Negative.  Negative for trouble swallowing.   Eyes: Negative for visual disturbance.  Respiratory: Negative for chest tightness, shortness of breath and wheezing.   Cardiovascular: Negative for chest pain, palpitations and leg swelling.  Gastrointestinal: Negative for abdominal pain, constipation, diarrhea, nausea and vomiting.  Endocrine: Negative.   Genitourinary: Negative.  Negative for difficulty urinating.  Musculoskeletal: Negative.  Negative for arthralgias.  Skin: Negative.   Neurological: Negative.  Negative for dizziness, weakness, light-headedness, numbness and headaches.  Hematological: Negative for adenopathy. Does not bruise/bleed easily.  Psychiatric/Behavioral: Negative.     Objective:  BP (!) 144/72 (BP Location: Left Arm, Patient Position: Sitting, Cuff Size: Normal)   Pulse 65   Temp 98.1 F (36.7 C) (Oral)   Resp 16   Ht 5\' 11"  (1.803 m)   Wt 227 lb (103 kg)   SpO2 93%   BMI 31.66 kg/m   BP Readings from Last 3 Encounters:  02/07/19 Marland Kitchen)  144/72  01/27/19 (!) 174/70  01/24/19 (!) 176/66    Wt Readings from Last 3 Encounters:  02/07/19 227 lb (103 kg)  01/27/19 225 lb 15.5 oz (102.5 kg)  01/24/19 225 lb (102.1 kg)    Physical Exam Vitals signs reviewed.  Constitutional:      Appearance: He is not ill-appearing or diaphoretic.  HENT:     Nose: Nose normal. No congestion or rhinorrhea.     Mouth/Throat:     Mouth: Mucous membranes are moist.     Pharynx: Oropharynx is clear. No oropharyngeal exudate or posterior oropharyngeal  erythema.  Eyes:     General: No scleral icterus.    Conjunctiva/sclera: Conjunctivae normal.  Neck:     Musculoskeletal: Normal range of motion and neck supple.  Cardiovascular:     Rate and Rhythm: Normal rate and regular rhythm.     Heart sounds: Murmur present. Systolic murmur present with a grade of 3/6. No diastolic murmur. No gallop.   Pulmonary:     Effort: Pulmonary effort is normal.     Breath sounds: No stridor. No wheezing, rhonchi or rales.  Abdominal:     General: Abdomen is flat. Bowel sounds are normal.     Palpations: There is no hepatomegaly, splenomegaly or mass.     Tenderness: There is no abdominal tenderness.  Musculoskeletal:     Right lower leg: No edema.     Left lower leg: No edema.  Skin:    General: Skin is warm and dry.  Neurological:     General: No focal deficit present.     Mental Status: He is oriented to person, place, and time. Mental status is at baseline.  Psychiatric:        Mood and Affect: Mood normal.        Behavior: Behavior normal.     Lab Results  Component Value Date   WBC 8.7 01/27/2019   HGB 14.0 01/27/2019   HCT 44.2 01/27/2019   PLT 232 01/27/2019   GLUCOSE 158 (H) 01/27/2019   CHOL 145 11/17/2018   TRIG 94.0 11/17/2018   HDL 43.30 11/17/2018   LDLDIRECT 81.0 05/26/2018   LDLCALC 83 11/17/2018   ALT 12 11/17/2018   AST 14 11/17/2018   NA 139 01/27/2019   K 3.9 01/27/2019   CL 102 01/27/2019   CREATININE 1.31 (H) 01/27/2019   BUN 20 01/27/2019   CO2 29 01/27/2019   TSH 2.47 02/15/2018   PSA 1.05 10/28/2015   INR 1.04 09/14/2017   HGBA1C 7.9 (H) 11/17/2018   MICROALBUR 14.5 (H) 05/26/2018    Mr Brain Wo Contrast (neuro Protocol)  Result Date: 01/27/2019 CLINICAL DATA:  Recent fall.  Vertigo.  Nausea. EXAM: MRI HEAD WITHOUT CONTRAST TECHNIQUE: Multiplanar, multiecho pulse sequences of the brain and surrounding structures were obtained without intravenous contrast. COMPARISON:  Head CT 3 days ago.  MRI 06/17/2013  FINDINGS: Brain: Diffusion imaging does not show any acute or subacute infarction. Mild chronic small-vessel ischemic change affects the pons. Few old small vessel cerebellar infarctions. Cerebral hemispheres show age related atrophy with moderate chronic small-vessel ischemic changes of the deep and subcortical white matter. No cortical or large vessel territory infarction. No mass lesion, hemorrhage, hydrocephalus or extra-axial collection. Improving right posterior parietal scalp hematoma. Vascular: Major vessels at the base of the brain show flow. Skull and upper cervical spine: Negative Sinuses/Orbits: Sinuses are clear. Orbits are negative. No fluid seen in the middle ears or mastoids. Other: None IMPRESSION: No  acute or traumatic intracranial finding. Atrophy and moderate chronic small-vessel ischemic changes as outlined above. Electronically Signed   By: Nelson Chimes M.D.   On: 01/27/2019 17:26    Assessment & Plan:   Damek was seen today for hypertension and diabetes.  Diagnoses and all orders for this visit:  Essential hypertension- His blood pressure is adequately well controlled.  Type II diabetes mellitus with manifestations ([place])- His blood sugars have been adequately well controlled.   I am having Ryan Duke maintain his sitaGLIPtin, atenolol-chlorthalidone, DULoxetine, tamsulosin, pravastatin, losartan, multivitamin with minerals, ondansetron, acetaminophen, ibuprofen, meclizine, and LORazepam.  No orders of the defined types were placed in this encounter.    Follow-up: No follow-ups on file.  [name], MD

## 2019-02-08 ENCOUNTER — Encounter: Payer: Self-pay | Admitting: Internal Medicine

## 2019-02-08 NOTE — Patient Instructions (Signed)
Type 2 Diabetes Mellitus, Diagnosis, Adult Type 2 diabetes (type 2 diabetes mellitus) is a long-term (chronic) disease. In type 2 diabetes, one or both of these problems may be present:  The pancreas does not make enough of a hormone called insulin.  Cells in the body do not respond properly to insulin that the body makes (insulin resistance). Normally, insulin allows blood sugar (glucose) to enter cells in the body. The cells use glucose for energy. Insulin resistance or lack of insulin causes excess glucose to build up in the blood instead of going into cells. As a result, high blood glucose (hyperglycemia) develops. What increases the risk? The following factors may make you more likely to develop type 2 diabetes:  Having a family member with type 2 diabetes.  Being overweight or obese.  Having an inactive (sedentary) lifestyle.  Having been diagnosed with insulin resistance.  Having a history of prediabetes, gestational diabetes, or polycystic ovary syndrome (PCOS).  Being of American-Indian, African-American, Hispanic/Latino, or Asian/Pacific Islander descent. What are the signs or symptoms? In the early stage of this condition, you may not have symptoms. Symptoms develop slowly and may include:  Increased thirst (polydipsia).  Increased hunger(polyphagia).  Increased urination (polyuria).  Increased urination during the night (nocturia).  Unexplained weight loss.  Frequent infections that keep coming back (recurring).  Fatigue.  Weakness.  Vision changes, such as blurry vision.  Cuts or bruises that are slow to heal.  Tingling or numbness in the hands or feet.  Dark patches on the skin (acanthosis nigricans). How is this diagnosed? This condition is diagnosed based on your symptoms, your medical history, a physical exam, and your blood glucose level. Your blood glucose may be checked with one or more of the following blood tests:  A fasting blood glucose (FBG)  test. You will not be allowed to eat (you will fast) for 8 hours or longer before a blood sample is taken.  A random blood glucose test. This test checks blood glucose at any time of day regardless of when you ate.  An A1c (hemoglobin A1c) blood test. This test provides information about blood glucose control over the previous 2-3 months.  An oral glucose tolerance test (OGTT). This test measures your blood glucose at two times: ? After fasting. This is your baseline blood glucose level. ? Two hours after drinking a beverage that contains glucose. You may be diagnosed with type 2 diabetes if:  Your FBG level is 126 mg/dL (7.0 mmol/L) or higher.  Your random blood glucose level is 200 mg/dL (11.1 mmol/L) or higher.  Your A1c level is 6.5% or higher.  Your OGTT result is higher than 200 mg/dL (11.1 mmol/L). These blood tests may be repeated to confirm your diagnosis. How is this treated? Your treatment may be managed by a specialist called an endocrinologist. Type 2 diabetes may be treated by following instructions from your health care provider about:  Making diet and lifestyle changes. This may include: ? Following an individualized nutrition plan that is developed by a diet and nutrition specialist (registered dietitian). ? Exercising regularly. ? Finding ways to manage stress.  Checking your blood glucose level as often as told.  Taking diabetes medicines or insulin daily. This helps to keep your blood glucose levels in the healthy range. ? If you use insulin, you may need to adjust the dosage depending on how physically active you are and what foods you eat. Your health care provider will tell you how to adjust your dosage.    Taking medicines to help prevent complications from diabetes, such as: ? Aspirin. ? Medicine to lower cholesterol. ? Medicine to control blood pressure. Your health care provider will set individualized treatment goals for you. Your goals will be based on  your age, other medical conditions you have, and how you respond to diabetes treatment. Generally, the goal of treatment is to maintain the following blood glucose levels:  Before meals (preprandial): 80-130 mg/dL (4.4-7.2 mmol/L).  After meals (postprandial): below 180 mg/dL (10 mmol/L).  A1c level: less than 7%. Follow these instructions at home: Questions to ask your health care provider  Consider asking the following questions: ? Do I need to meet with a diabetes educator? ? Where can I find a support group for people with diabetes? ? What equipment will I need to manage my diabetes at home? ? What diabetes medicines do I need, and when should I take them? ? How often do I need to check my blood glucose? ? What number can I call if I have questions? ? When is my next appointment? General instructions  Take over-the-counter and prescription medicines only as told by your health care provider.  Keep all follow-up visits as told by your health care provider. This is important.  For more information about diabetes, visit: ? American Diabetes Association (ADA): www.diabetes.org ? American Association of Diabetes Educators (AADE): www.diabeteseducator.org Contact a health care provider if:  Your blood glucose is at or above 240 mg/dL (13.3 mmol/L) for 2 days in a row.  You have been sick or have had a fever for 2 days or longer, and you are not getting better.  You have any of the following problems for more than 6 hours: ? You cannot eat or drink. ? You have nausea and vomiting. ? You have diarrhea. Get help right away if:  Your blood glucose is lower than 54 mg/dL (3.0 mmol/L).  You become confused or you have trouble thinking clearly.  You have difficulty breathing.  You have moderate or large ketone levels in your urine. Summary  Type 2 diabetes (type 2 diabetes mellitus) is a long-term (chronic) disease. In type 2 diabetes, the pancreas does not make enough of a  hormone called insulin, or cells in the body do not respond properly to insulin that the body makes (insulin resistance).  This condition is treated by making diet and lifestyle changes and taking diabetes medicines or insulin.  Your health care provider will set individualized treatment goals for you. Your goals will be based on your age, other medical conditions you have, and how you respond to diabetes treatment.  Keep all follow-up visits as told by your health care provider. This is important. This information is not intended to replace advice given to you by your health care provider. Make sure you discuss any questions you have with your health care provider. Document Released: 11/16/2005 Document Revised: 06/17/2017 Document Reviewed: 12/20/2015 Elsevier Interactive Patient Education  2019 Elsevier Inc.  

## 2019-02-21 LAB — HM DIABETES EYE EXAM

## 2019-04-07 ENCOUNTER — Telehealth: Payer: Self-pay | Admitting: Cardiovascular Disease

## 2019-04-07 NOTE — Telephone Encounter (Signed)
Patient set up for MyChart? Send consent through mychart  Is patient using Smartphone/computer/tablet?yes  Did audio/video work?  Does patient need telephone visit?no  Best phone number to use? [phone]  Special Instructions? Patient will have vitals when nurse calls      Virtual Visit Pre-Appointment Phone Call  "(Name), I am calling you today to discuss your upcoming appointment. We are currently trying to limit exposure to the virus that causes COVID-19 by seeing patients at home rather than in the office."  1. "What is the BEST phone number to call the day of the visit?" - include this in appointment notes  2. Do you have or have access to (through a family member/friend) a smartphone with video capability that we can use for your visit?" a. If yes - list this number in appt notes as cell (if different from BEST phone #) and list the appointment type as a VIDEO visit in appointment notes b. If no - list the appointment type as a PHONE visit in appointment notes  3. Confirm consent - "In the setting of the current Covid19 crisis, you are scheduled for a (phone or video) visit with your provider on (date) at (time).  Just as we do with many in-office visits, in order for you to participate in this visit, we must obtain consent.  If you'd like, I can send this to your mychart (if signed up) or email for you to review.  Otherwise, I can obtain your verbal consent now.  All virtual visits are billed to your insurance company just like a normal visit would be.  By agreeing to a virtual visit, we'd like you to understand that the technology does not allow for your provider to perform an examination, and thus may limit your provider's ability to fully assess your condition. If your provider identifies any concerns that need to be evaluated in person, we will make arrangements to do so.  Finally, though the technology is pretty good, we cannot assure that it will always work on either your  or our end, and in the setting of a video visit, we may have to convert it to a phone-only visit.  In either situation, we cannot ensure that we have a secure connection.  Are you willing to proceed?" STAFF: Did the patient verbally acknowledge consent to telehealth visit? Document YES/NO here: yes  4. Advise patient to be prepared - "Two hours prior to your appointment, go ahead and check your blood pressure, pulse, oxygen saturation, and your weight (if you have the equipment to check those) and write them all down. When your visit starts, your provider will ask you for this information. If you have an [place] Watch or Kardia device, please plan to have heart rate information ready on the day of your appointment. Please have a pen and paper handy nearby the day of the visit as well."  5. Give patient instructions for MyChart download to smartphone OR [email or web address] as below if video visit (depending on what platform provider is using)  6. Inform patient they will receive a phone call 15 minutes prior to their appointment time (may be from unknown caller ID) so they should be prepared to answer    TELEPHONE CALL NOTE  Brycen Bean Havey has been deemed a candidate for a follow-up tele-health visit to limit community exposure during the Covid-19 pandemic. I spoke with the patient via phone to ensure availability of phone/video source, confirm preferred email & phone number, and discuss instructions and  expectations.  I reminded Ryan Duke to be prepared with any vital sign and/or heart rhythm information that could potentially be obtained via home monitoring, at the time of his visit. I reminded Jeffren Dombek Jurek to expect a phone call prior to his visit.  Howie Ill 04/07/2019 4:53 PM   INSTRUCTIONS FOR DOWNLOADING THE MYCHART APP TO SMARTPHONE  - The patient must first make sure to have activated MyChart and know their login information - If [place], go to [employer] and type in MyChart in the  search bar and download the app. If Android, ask patient to go to [employer] and type in [place] in the search bar and download the app. The app is free but as with any other app downloads, their phone may require them to verify saved payment information or [place]/Android password.  - The patient will need to then log into the app with their MyChart username and password, and select [facility] as their healthcare provider to link the account. When it is time for your visit, go to the MyChart app, find appointments, and click Begin Video Visit. Be sure to Select Allow for your device to access the Microphone and Camera for your visit. You will then be connected, and your provider will be with you shortly.  **If they have any issues connecting, or need assistance please contact MyChart service desk [phone])**  **If using a computer, in order to ensure the best quality for their visit they will need to use either of the following Internet Browsers: [employer], or Google Chrome**  IF USING DOXIMITY or DOXY.ME - The patient will receive a link just prior to their visit by text.     FULL LENGTH CONSENT FOR TELE-HEALTH VISIT   I hereby voluntarily request, consent and authorize [facility] and its employed or contracted physicians, physician assistants, nurse practitioners or other licensed health care professionals (the Practitioner), to provide me with telemedicine health care services (the Services") as deemed necessary by the treating Practitioner. I acknowledge and consent to receive the Services by the Practitioner via telemedicine. I understand that the telemedicine visit will involve communicating with the Practitioner through live audiovisual communication technology and the disclosure of certain medical information by electronic transmission. I acknowledge that I have been given the opportunity to request an in-person assessment or other available alternative prior  to the telemedicine visit and am voluntarily participating in the telemedicine visit.  I understand that I have the right to withhold or withdraw my consent to the use of telemedicine in the course of my care at any time, without affecting my right to future care or treatment, and that the Practitioner or I may terminate the telemedicine visit at any time. I understand that I have the right to inspect all information obtained and/or recorded in the course of the telemedicine visit and may receive copies of available information for a reasonable fee.  I understand that some of the potential risks of receiving the Services via telemedicine include:   Delay or interruption in medical evaluation due to technological equipment failure or disruption;  Information transmitted may not be sufficient (e.g. poor resolution of images) to allow for appropriate medical decision making by the Practitioner; and/or   In rare instances, security protocols could fail, causing a breach of personal health information.  Furthermore, I acknowledge that it is my responsibility to provide information about my medical history, conditions and care that is complete and accurate to the  best of my ability. I acknowledge that Practitioner's advice, recommendations, and/or decision may be based on factors not within their control, such as incomplete or inaccurate data provided by me or distortions of diagnostic images or specimens that may result from electronic transmissions. I understand that the practice of medicine is not an exact science and that Practitioner makes no warranties or guarantees regarding treatment outcomes. I acknowledge that I will receive a copy of this consent concurrently upon execution via email to the email address I last provided but may also request a printed copy by calling the office of [facility].    I understand that my insurance will be billed for this visit.   I have read or had this consent read  to me.  I understand the contents of this consent, which adequately explains the benefits and risks of the Services being provided via telemedicine.   I have been provided ample opportunity to ask questions regarding this consent and the Services and have had my questions answered to my satisfaction.  I give my informed consent for the services to be provided through the use of telemedicine in my medical care  By participating in this telemedicine visit I agree to the above.

## 2019-04-11 ENCOUNTER — Encounter: Payer: Self-pay | Admitting: Cardiovascular Disease

## 2019-04-11 ENCOUNTER — Other Ambulatory Visit: Payer: Self-pay

## 2019-04-11 ENCOUNTER — Telehealth (INDEPENDENT_AMBULATORY_CARE_PROVIDER_SITE_OTHER): Payer: Medicare Other | Admitting: Cardiovascular Disease

## 2019-04-11 VITALS — BP 151/79 | HR 51 | Ht 71.0 in | Wt 230.0 lb

## 2019-04-11 DIAGNOSIS — I350 Nonrheumatic aortic (valve) stenosis: Secondary | ICD-10-CM | POA: Diagnosis not present

## 2019-04-11 DIAGNOSIS — I10 Essential (primary) hypertension: Secondary | ICD-10-CM

## 2019-04-11 NOTE — Patient Instructions (Signed)
Medication Instructions:  Your provider recommends that you continue on your current medications as directed. Please refer to the Current Medication list given to you today.    Testing/Procedures: Your provider has requested that you have an echocardiogram. Echocardiography is a painless test that uses sound waves to create images of your heart. It provides your doctor with information about the size and shape of your heart and how well your heart's chambers and valves are working. This procedure takes approximately one hour. There are no restrictions for this procedure. We will call you to arrange this appointment.  Follow-Up: Your provider wants you to follow-up in: 1 year with Dr. Burt Knack, depending on your echo results.  You will receive a reminder letter in the mail two months in advance. If you don't receive a letter, please call our office to schedule the follow-up appointment.

## 2019-04-11 NOTE — Progress Notes (Signed)
Virtual Visit via Video Note   This visit type was conducted due to national recommendations for restrictions regarding the COVID-19 Pandemic (e.g. social distancing) in an effort to limit this patient's exposure and mitigate transmission in our community.  Due to his co-morbid illnesses, this patient is at least at moderate risk for complications without adequate follow up.  This format is felt to be most appropriate for this patient at this time.  All issues noted in this document were discussed and addressed.  A limited physical exam was performed with this format.  Please refer to the patient's chart for his consent to telehealth for [facility].   Date:  04/11/2019   ID:  Ryan Duke, DOB 1941/01/31, MRN [id number]  Patient Location: Home Provider Location: Home  PCP:  Janith Lima, MD  Cardiologist:  No primary care provider on file.  Electrophysiologist:  None   Evaluation Performed:  Follow-Up Visit  Chief Complaint: Aortic stenosis follow-up  History of Present Illness:    CLENNON Duke is a 78 y.o. male with moderate aortic stenosis, presenting for follow-up evaluation today.  This visit is done as a Publishing copy in light of the COVID-19 pandemic.  The patient does not have symptoms concerning for COVID-19 infection (fever, chills, cough, or new shortness of breath).   Since I have seen the patient last 1 year ago, he underwent back surgery without any cardiac-related complication.  He had a fall a few months ago and sustained a closed head injury.  He was evaluated in the emergency department and neuro imaging was negative.  He is subsequently recovered with no long-term problems.  Today, he denies symptoms of palpitations, chest pain, shortness of breath, orthopnea, PND, lower extremity edema, dizziness, or syncope.    Past Medical History:  Diagnosis Date   Diabetes mellitus    type 2   Hyperlipidemia    Hypertension    Hypogonadism, male      Pancreatitis 2008   Renal insufficiency    Past Surgical History:  Procedure Laterality Date   EYE SURGERY     cataracts   FLEXIBLE SIGMOIDOSCOPY N/A 09/10/2017   Procedure: FLEXIBLE SIGMOIDOSCOPY;  Surgeon: Mauri Pole, MD;  Location: [facility];  Service: Endoscopy;  Laterality: N/A;   IR KYPHO LUMBAR INC FX REDUCE BONE BX UNI/BIL CANNULATION INC/IMAGING  09/14/2017   IR RADIOLOGIST EVAL & MGMT  10/07/2017     Current Meds  Medication Sig   acetaminophen (TYLENOL) 500 MG tablet Take 1,000 mg by mouth 2 (two) times daily as needed for moderate pain.   atenolol-chlorthalidone (TENORETIC) 100-25 MG tablet Take 1 tablet by mouth daily.   DULoxetine (CYMBALTA) 60 MG capsule Take 60 mg by mouth daily.   ibuprofen (ADVIL,MOTRIN) 200 MG tablet Take 400 mg by mouth every 6 (six) hours as needed for moderate pain.   LORazepam (ATIVAN) 0.5 MG tablet Take 1 tablet (0.5 mg total) by mouth 2 (two) times daily as needed (Dizziness).   losartan (COZAAR) 100 MG tablet Take 100 mg by mouth daily.   meclizine (ANTIVERT) 12.5 MG tablet Take 1 tablet (12.5 mg total) by mouth 3 (three) times daily as needed for dizziness.   Multiple Vitamin (MULTIVITAMIN WITH MINERALS) TABS tablet Take 1 tablet by mouth 2 (two) times a week.    pravastatin (PRAVACHOL) 40 MG tablet Take 40 mg by mouth daily.   sitaGLIPtin (JANUVIA) 100 MG tablet Take 1 tablet (100 mg total) by mouth daily.  Allergies:   Fentanyl; Farxiga [dapagliflozin]; Metformin and related; Atorvastatin; Benazepril hcl; Flexeril [cyclobenzaprine]; and Oxycodone   Social History   Tobacco Use   Smoking status: Former Smoker    Last attempt to quit: 11/30/1974    Years since quitting: 44.3   Smokeless tobacco: Never Used  Substance Use Topics   Alcohol use: Not Currently    Alcohol/week: 0.0 standard drinks   Drug use: No    Family Hx: The patient's family history includes Hypertension in an other family  member.  ROS:   Please see the history of present illness.    All other systems reviewed and are negative.   Prior CV studies:   The following studies were reviewed today:  Echo 01/07/2018: Study Conclusions  - Left ventricle: The cavity size was normal. There was mild   concentric hypertrophy. Systolic function was vigorous. The   estimated ejection fraction was in the range of 65% to 70%. Wall   motion was normal; there were no regional wall motion   abnormalities. - Aortic valve: Noncoronary cusp mobility was severely restricted.   Transvalvular velocity was increased more than expected, due to   high cardiac output. There was moderate stenosis. There was   trivial regurgitation. Mean gradient (S): 34 mm Hg. Peak gradient   (S): 65 mm Hg. Peak velocity ratio of LVOT to aortic valve: 0.39. - Mitral valve: Calcified annulus. - Left atrium: The atrium was mildly dilated. - Right atrium: The atrium was mildly dilated. - Atrial septum: No defect or patent foramen ovale was identified.  Impressions:  - Compared to 2016, aortic valve gradients are higher, but this is   due at least in part to increased stroke volume/cardiac output.   The aortic jet remains early peaking and the dimensionless   obstructive index is relatively high. Parasternal images are of   poor quality. LVOT diameter measurement is likely to be   inaccurate.  Left ventricle:  The cavity size was normal. There was mild concentric hypertrophy. Systolic function was vigorous. The estimated ejection fraction was in the range of 65% to 70%. Wall motion was normal; there were no regional wall motion abnormalities.  ------------------------------------------------------------------- Aortic valve:   Trileaflet; moderately thickened, moderately calcified leaflets.  Noncoronary cusp mobility was severely restricted.  Doppler:  Transvalvular velocity was increased more than expected, due to high cardiac output.  There was moderate stenosis. There was trivial regurgitation.    VTI ratio of LVOT to aortic valve: 0.46. Valve area (VTI): 1.58 cm^2. Indexed valve area (VTI): 0.72 cm^2/m^2. Peak velocity ratio of LVOT to aortic valve: 0.39. Valve area (Vmax): 1.34 cm^2. Indexed valve area (Vmax): 0.61 cm^2/m^2. Mean velocity ratio of LVOT to aortic valve: 0.4. Valve area (Vmean): 1.38 cm^2. Indexed valve area (Vmean): 0.63 cm^2/m^2.    Mean gradient (S): 34 mm Hg. Peak gradient (S): 65 mm Hg.  ------------------------------------------------------------------- Aorta:  Aortic root: The aortic root was normal in size. Ascending aorta: The ascending aorta was normal in size.  ------------------------------------------------------------------- Mitral valve:   Calcified annulus. Leaflet separation was normal. Doppler:  Transvalvular velocity was within the normal range. There was no evidence for stenosis. There was no regurgitation.  ------------------------------------------------------------------- Left atrium:  The atrium was mildly dilated.  ------------------------------------------------------------------- Atrial septum:  No defect or patent foramen ovale was identified.   ------------------------------------------------------------------- Right ventricle:  The cavity size was normal. Systolic function was normal.  ------------------------------------------------------------------- Pulmonic valve:   Poorly visualized.  Doppler:  There was trivial regurgitation.  -------------------------------------------------------------------  Tricuspid valve:  Poorly visualized.  Structurally normal valve. Leaflet separation was normal.  Doppler:  Transvalvular velocity was within the normal range. There was no regurgitation.  ------------------------------------------------------------------- Pulmonary artery:    Systolic pressure could not be  accurately estimated.  ------------------------------------------------------------------- Right atrium:  The atrium was mildly dilated.  ------------------------------------------------------------------- Pericardium:  There was no pericardial effusion.  ------------------------------------------------------------------- Systemic veins: Inferior vena cava: The vessel was normal in size. The respirophasic diameter changes were in the normal range (>= 50%), consistent with normal central venous pressure.  Labs/Other Tests and Data Reviewed:    EKG:  An ECG dated January 27, 2019 was personally reviewed today and demonstrated:  Sinus bradycardia 50 bpm, LVH with repolarization abnormality  Recent Labs: 11/17/2018: ALT 12 01/27/2019: BUN 20; Creatinine, Ser 1.31; Hemoglobin 14.0; Platelets 232; Potassium 3.9; Sodium 139   Recent Lipid Panel Lab Results  Component Value Date/Time   CHOL 145 11/17/2018 08:36 AM   TRIG 94.0 11/17/2018 08:36 AM   TRIG 171 (H) 10/06/2006 07:41 AM   HDL 43.30 11/17/2018 08:36 AM   CHOLHDL 3 11/17/2018 08:36 AM   LDLCALC 83 11/17/2018 08:36 AM   LDLDIRECT 81.0 05/26/2018 03:47 PM    Wt Readings from Last 3 Encounters:  04/11/19 230 lb (104.3 kg)  02/07/19 227 lb (103 kg)  01/27/19 225 lb 15.5 oz (102.5 kg)     Objective:    Vital Signs:  BP (!) 151/79    Pulse (!) 51    Ht 5\' 11"  (1.803 m)    Wt 230 lb (104.3 kg)    BMI 32.08 kg/m    VITAL SIGNS:  reviewed The patient is alert, oriented, in no distress.  He is breathing comfortably in normal conversation.  HEENT is normal.  Remaining exam is not possible because of the virtual nature of this visit.  ASSESSMENT & PLAN:    1. Moderate to severe aortic stenosis, stage C disease: The patient appears to be asymptomatic.  He is due for follow-up echo and this will be arranged.  His last echo study is reviewed with elevated transvalvular gradients but the majority of criteria suggesting moderate  aortic stenosis.  We reviewed potential symptoms of chest discomfort, shortness of breath, and lightheadedness and the patient is educated regarding aortic stenosis natural history today.  As long as things are stable, I would anticipate seeing him back in 1 year. 2. Hypertension: Blood pressure above goal today in this patient with aortic valve disease and diabetes.  He is treated with multidrug antihypertensive therapy including losartan 100 mg daily and atenolol/chlorthalidone.  We discussed options of increasing his antihypertensive therapy versus a trial of lifestyle modification.  He prefers to work with weight loss and sodium restriction which we discussed in detail today.  I asked him to monitor his blood pressure 1 or 2 days/week and call either me or Dr. Ronnald Ramp if he sees that his blood pressure is consistently elevated with a systolic reading greater than 140 mmHg over the next few months.  COVID-19 Education: The signs and symptoms of COVID-19 were discussed with the patient and how to seek care for testing (follow up with PCP or arrange E-visit).  The importance of social distancing was discussed today.  Time:   Today, I have spent 20 minutes with the patient with telehealth technology discussing the above problems.     Medication Adjustments/Labs and Tests Ordered: Current medicines are reviewed at length with the patient today.  Concerns regarding medicines are outlined above.  Tests Ordered: No orders of the defined types were placed in this encounter.   Medication Changes: No orders of the defined types were placed in this encounter.   Disposition:  Follow up in 1 year(s)  Signed, Sherren Mocha, MD  04/11/2019 10:00 AM    [place]

## 2019-04-11 NOTE — Addendum Note (Signed)
Addended by: Harland German A on: 04/11/2019 11:32 AM   Modules accepted: Orders

## 2019-04-14 ENCOUNTER — Telehealth (HOSPITAL_COMMUNITY): Payer: Self-pay | Admitting: Radiology

## 2019-04-14 NOTE — Telephone Encounter (Signed)

## 2019-04-18 ENCOUNTER — Ambulatory Visit
Admission: RE | Admit: 2019-04-18 | Discharge: 2019-04-18 | Disposition: A | Discharge status: Home / Self Care | Payer: Medicare Other | Source: Ambulatory Visit | Attending: Orthopedic Surgery | Admitting: Orthopedic Surgery

## 2019-04-18 ENCOUNTER — Other Ambulatory Visit: Payer: Self-pay | Admitting: Orthopedic Surgery

## 2019-04-18 ENCOUNTER — Other Ambulatory Visit (HOSPITAL_COMMUNITY): Payer: Medicare Other

## 2019-04-18 ENCOUNTER — Other Ambulatory Visit: Payer: Self-pay

## 2019-04-18 DIAGNOSIS — S32040A Wedge compression fracture of fourth lumbar vertebra, initial encounter for closed fracture: Secondary | ICD-10-CM

## 2019-04-18 DIAGNOSIS — M8008XA Age-related osteoporosis with current pathological fracture, vertebra(e), initial encounter for fracture: Secondary | ICD-10-CM | POA: Diagnosis not present

## 2019-04-18 DIAGNOSIS — M545 Low back pain: Secondary | ICD-10-CM | POA: Diagnosis not present

## 2019-04-18 DIAGNOSIS — M48061 Spinal stenosis, lumbar region without neurogenic claudication: Secondary | ICD-10-CM | POA: Diagnosis not present

## 2019-04-21 DIAGNOSIS — M8008XA Age-related osteoporosis with current pathological fracture, vertebra(e), initial encounter for fracture: Secondary | ICD-10-CM | POA: Diagnosis not present

## 2019-04-27 DIAGNOSIS — S32040A Wedge compression fracture of fourth lumbar vertebra, initial encounter for closed fracture: Secondary | ICD-10-CM | POA: Diagnosis not present

## 2019-04-28 ENCOUNTER — Ambulatory Visit: Payer: Medicare Other | Admitting: Cardiovascular Disease

## 2019-05-19 DIAGNOSIS — M48062 Spinal stenosis, lumbar region with neurogenic claudication: Secondary | ICD-10-CM | POA: Diagnosis not present

## 2019-05-19 DIAGNOSIS — M4726 Other spondylosis with radiculopathy, lumbar region: Secondary | ICD-10-CM | POA: Diagnosis not present

## 2019-05-19 DIAGNOSIS — M8008XD Age-related osteoporosis with current pathological fracture, vertebra(e), subsequent encounter for fracture with routine healing: Secondary | ICD-10-CM | POA: Diagnosis not present

## 2019-05-22 ENCOUNTER — Other Ambulatory Visit: Payer: Self-pay | Admitting: Internal Medicine

## 2019-05-23 ENCOUNTER — Ambulatory Visit: Payer: Medicare Other | Admitting: Internal Medicine

## 2019-05-25 ENCOUNTER — Other Ambulatory Visit (HOSPITAL_COMMUNITY): Payer: Medicare Other

## 2019-06-01 ENCOUNTER — Other Ambulatory Visit: Payer: Self-pay

## 2019-06-01 ENCOUNTER — Ambulatory Visit (INDEPENDENT_AMBULATORY_CARE_PROVIDER_SITE_OTHER): Payer: Medicare Other | Admitting: Internal Medicine

## 2019-06-01 ENCOUNTER — Encounter: Payer: Self-pay | Admitting: Internal Medicine

## 2019-06-01 VITALS — BP 126/74 | HR 76 | Temp 98.0°F | Resp 16 | Ht 71.0 in | Wt 212.0 lb

## 2019-06-01 DIAGNOSIS — I10 Essential (primary) hypertension: Secondary | ICD-10-CM | POA: Diagnosis not present

## 2019-06-01 DIAGNOSIS — E118 Type 2 diabetes mellitus with unspecified complications: Secondary | ICD-10-CM | POA: Diagnosis not present

## 2019-06-01 LAB — POCT GLYCOSYLATED HEMOGLOBIN (HGB A1C): Hemoglobin A1C: 7.2 % — AB (ref 4.0–5.6)

## 2019-06-01 MED ORDER — SITAGLIPTIN PHOSPHATE 100 MG PO TABS: 100.0000 mg | ORAL_TABLET | Freq: Every day | ORAL | 1 refills | Status: DC

## 2019-06-01 NOTE — Patient Instructions (Signed)
Type 2 Diabetes Mellitus, Diagnosis, Adult Type 2 diabetes (type 2 diabetes mellitus) is a long-term (chronic) disease. In type 2 diabetes, one or both of these problems may be present:  The pancreas does not make enough of a hormone called insulin.  Cells in the body do not respond properly to insulin that the body makes (insulin resistance). Normally, insulin allows blood sugar (glucose) to enter cells in the body. The cells use glucose for energy. Insulin resistance or lack of insulin causes excess glucose to build up in the blood instead of going into cells. As a result, high blood glucose (hyperglycemia) develops. What increases the risk? The following factors may make you more likely to develop type 2 diabetes:  Having a family member with type 2 diabetes.  Being overweight or obese.  Having an inactive (sedentary) lifestyle.  Having been diagnosed with insulin resistance.  Having a history of prediabetes, gestational diabetes, or polycystic ovary syndrome (PCOS).  Being of American-Indian, African-American, Hispanic/Latino, or Asian/Pacific Islander descent. What are the signs or symptoms? In the early stage of this condition, you may not have symptoms. Symptoms develop slowly and may include:  Increased thirst (polydipsia).  Increased hunger(polyphagia).  Increased urination (polyuria).  Increased urination during the night (nocturia).  Unexplained weight loss.  Frequent infections that keep coming back (recurring).  Fatigue.  Weakness.  Vision changes, such as blurry vision.  Cuts or bruises that are slow to heal.  Tingling or numbness in the hands or feet.  Dark patches on the skin (acanthosis nigricans). How is this diagnosed? This condition is diagnosed based on your symptoms, your medical history, a physical exam, and your blood glucose level. Your blood glucose may be checked with one or more of the following blood tests:  A fasting blood glucose (FBG)  test. You will not be allowed to eat (you will fast) for 8 hours or longer before a blood sample is taken.  A random blood glucose test. This test checks blood glucose at any time of day regardless of when you ate.  An A1c (hemoglobin A1c) blood test. This test provides information about blood glucose control over the previous 2-3 months.  An oral glucose tolerance test (OGTT). This test measures your blood glucose at two times: ? After fasting. This is your baseline blood glucose level. ? Two hours after drinking a beverage that contains glucose. You may be diagnosed with type 2 diabetes if:  Your FBG level is 126 mg/dL (7.0 mmol/L) or higher.  Your random blood glucose level is 200 mg/dL (11.1 mmol/L) or higher.  Your A1c level is 6.5% or higher.  Your OGTT result is higher than 200 mg/dL (11.1 mmol/L). These blood tests may be repeated to confirm your diagnosis. How is this treated? Your treatment may be managed by a specialist called an endocrinologist. Type 2 diabetes may be treated by following instructions from your health care provider about:  Making diet and lifestyle changes. This may include: ? Following an individualized nutrition plan that is developed by a diet and nutrition specialist (registered dietitian). ? Exercising regularly. ? Finding ways to manage stress.  Checking your blood glucose level as often as told.  Taking diabetes medicines or insulin daily. This helps to keep your blood glucose levels in the healthy range. ? If you use insulin, you may need to adjust the dosage depending on how physically active you are and what foods you eat. Your health care provider will tell you how to adjust your dosage.    Taking medicines to help prevent complications from diabetes, such as: ? Aspirin. ? Medicine to lower cholesterol. ? Medicine to control blood pressure. Your health care provider will set individualized treatment goals for you. Your goals will be based on  your age, other medical conditions you have, and how you respond to diabetes treatment. Generally, the goal of treatment is to maintain the following blood glucose levels:  Before meals (preprandial): 80-130 mg/dL (4.4-7.2 mmol/L).  After meals (postprandial): below 180 mg/dL (10 mmol/L).  A1c level: less than 7%. Follow these instructions at home: Questions to ask your health care provider  Consider asking the following questions: ? Do I need to meet with a diabetes educator? ? Where can I find a support group for people with diabetes? ? What equipment will I need to manage my diabetes at home? ? What diabetes medicines do I need, and when should I take them? ? How often do I need to check my blood glucose? ? What number can I call if I have questions? ? When is my next appointment? General instructions  Take over-the-counter and prescription medicines only as told by your health care provider.  Keep all follow-up visits as told by your health care provider. This is important.  For more information about diabetes, visit: ? American Diabetes Association (ADA): www.diabetes.org ? American Association of Diabetes Educators (AADE): www.diabeteseducator.org Contact a health care provider if:  Your blood glucose is at or above 240 mg/dL (13.3 mmol/L) for 2 days in a row.  You have been sick or have had a fever for 2 days or longer, and you are not getting better.  You have any of the following problems for more than 6 hours: ? You cannot eat or drink. ? You have nausea and vomiting. ? You have diarrhea. Get help right away if:  Your blood glucose is lower than 54 mg/dL (3.0 mmol/L).  You become confused or you have trouble thinking clearly.  You have difficulty breathing.  You have moderate or large ketone levels in your urine. Summary  Type 2 diabetes (type 2 diabetes mellitus) is a long-term (chronic) disease. In type 2 diabetes, the pancreas does not make enough of a  hormone called insulin, or cells in the body do not respond properly to insulin that the body makes (insulin resistance).  This condition is treated by making diet and lifestyle changes and taking diabetes medicines or insulin.  Your health care provider will set individualized treatment goals for you. Your goals will be based on your age, other medical conditions you have, and how you respond to diabetes treatment.  Keep all follow-up visits as told by your health care provider. This is important. This information is not intended to replace advice given to you by your health care provider. Make sure you discuss any questions you have with your health care provider. Document Released: 11/16/2005 Document Revised: 01/14/2018 Document Reviewed: 12/20/2015 Elsevier Patient Education  2020 [employer]  

## 2019-06-01 NOTE — Progress Notes (Signed)
Subjective:  Patient ID: Ryan Duke, male    DOB: 1941/09/01  Age: 78 y.o. MRN: [id number]  CC: Hypertension and Diabetes   HPI Ryan Duke presents for f/up - He tells me he fell again about 1 to 2 months ago and reinjured his lower back.  It sounds like he is doing kyphoplasty with a spine surgeon.  He tells me his back pain is adequately controlled with the current meds.  He is making progress but is using a walker now.  Outpatient Medications Prior to Visit  Medication Sig Dispense Refill  . acetaminophen (TYLENOL) 500 MG tablet Take 1,000 mg by mouth 2 (two) times daily as needed for moderate pain.    Marland Kitchen atenolol-chlorthalidone (TENORETIC) 100-25 MG tablet TAKE 1 TABLET ONCE DAILY. 90 tablet 0  . DULoxetine (CYMBALTA) 60 MG capsule TAKE (1) CAPSULE DAILY. 90 capsule 0  . gabapentin (NEURONTIN) 300 MG capsule Take 1 capsule by mouth 3 (three) times daily.    Marland Kitchen HYDROcodone-acetaminophen (NORCO/VICODIN) 5-325 MG tablet Take 1 tablet by mouth as needed.    Marland Kitchen ibuprofen (ADVIL,MOTRIN) 200 MG tablet Take 400 mg by mouth every 6 (six) hours as needed for moderate pain.    Marland Kitchen LORazepam (ATIVAN) 0.5 MG tablet Take 1 tablet (0.5 mg total) by mouth 2 (two) times daily as needed (Dizziness). 5 tablet 0  . losartan (COZAAR) 100 MG tablet TAKE 1 TABLET DAILY FOR HYPERTENSION. 90 tablet 0  . meclizine (ANTIVERT) 12.5 MG tablet Take 1 tablet (12.5 mg total) by mouth 3 (three) times daily as needed for dizziness. 8 tablet 0  . Multiple Vitamin (MULTIVITAMIN WITH MINERALS) TABS tablet Take 1 tablet by mouth 2 (two) times a week.     . pravastatin (PRAVACHOL) 40 MG tablet TAKE 1 TABLET ONCE DAILY. 90 tablet 0  . tamsulosin (FLOMAX) 0.4 MG CAPS capsule TAKE (1) CAPSULE DAILY. 90 capsule 0  . sitaGLIPtin (JANUVIA) 100 MG tablet Take 1 tablet (100 mg total) by mouth daily. 90 tablet 1   No facility-administered medications prior to visit.     ROS Review of Systems  Constitutional: Negative.   Negative for diaphoresis, fatigue and unexpected weight change.  HENT: Negative.  Negative for sore throat and trouble swallowing.   Eyes: Negative.   Respiratory: Negative.  Negative for cough, chest tightness, shortness of breath and wheezing.   Cardiovascular: Negative for chest pain, palpitations and leg swelling.  Gastrointestinal: Negative for abdominal pain, constipation, diarrhea, nausea and vomiting.  Endocrine: Negative.  Negative for polydipsia, polyphagia and polyuria.  Genitourinary: Negative.  Negative for difficulty urinating.  Musculoskeletal: Positive for back pain and gait problem. Negative for arthralgias.  Skin: Negative.  Negative for color change.  Neurological: Negative for dizziness, weakness, light-headedness and numbness.  Hematological: Negative for adenopathy. Does not bruise/bleed easily.  Psychiatric/Behavioral: Negative.     Objective:  BP 126/74 (BP Location: Left Arm, Patient Position: Sitting, Cuff Size: Normal)   Pulse 76   Temp 98 F (36.7 C) (Oral)   Resp 16   Ht 5\' 11"  (1.803 m)   Wt 212 lb (96.2 kg)   SpO2 96%   BMI 29.57 kg/m   BP Readings from Last 3 Encounters:  06/01/19 126/74  04/11/19 (!) 151/79  02/07/19 (!) 144/72    Wt Readings from Last 3 Encounters:  06/01/19 212 lb (96.2 kg)  04/11/19 230 lb (104.3 kg)  02/07/19 227 lb (103 kg)    Physical Exam Constitutional:  General: He is not in acute distress.    Appearance: He is obese. He is not ill-appearing, toxic-appearing or diaphoretic.  HENT:     Nose: Nose normal. No congestion.     Mouth/Throat:     Mouth: Mucous membranes are moist.  Eyes:     General: No scleral icterus.    Conjunctiva/sclera: Conjunctivae normal.  Neck:     Musculoskeletal: Normal range of motion and neck supple. No neck rigidity.  Cardiovascular:     Rate and Rhythm: Normal rate and regular rhythm.     Heart sounds: Murmur present. Systolic murmur present with a grade of 1/6. No diastolic  murmur.  Pulmonary:     Breath sounds: No stridor. No wheezing, rhonchi or rales.  Abdominal:     General: Abdomen is protuberant. Bowel sounds are normal. There is no distension.     Palpations: There is no hepatomegaly or splenomegaly.     Tenderness: There is no abdominal tenderness.  Musculoskeletal:     Right lower leg: No edema.     Left lower leg: No edema.  Lymphadenopathy:     Cervical: No cervical adenopathy.  Skin:    General: Skin is warm.     Findings: No lesion.  Neurological:     General: No focal deficit present.     Mental Status: He is alert.  Psychiatric:        Mood and Affect: Mood normal.        Behavior: Behavior normal.     Lab Results  Component Value Date   WBC 8.7 01/27/2019   HGB 14.0 01/27/2019   HCT 44.2 01/27/2019   PLT 232 01/27/2019   GLUCOSE 158 (H) 01/27/2019   CHOL 145 11/17/2018   TRIG 94.0 11/17/2018   HDL 43.30 11/17/2018   LDLDIRECT 81.0 05/26/2018   LDLCALC 83 11/17/2018   ALT 12 11/17/2018   AST 14 11/17/2018   NA 139 01/27/2019   K 3.9 01/27/2019   CL 102 01/27/2019   CREATININE 1.31 (H) 01/27/2019   BUN 20 01/27/2019   CO2 29 01/27/2019   TSH 2.47 02/15/2018   PSA 1.05 10/28/2015   INR 1.04 09/14/2017   HGBA1C 7.2 (A) 06/01/2019   MICROALBUR 14.5 (H) 05/26/2018    Mr Lumbar Spine Wo Contrast  Result Date: 04/19/2019 CLINICAL DATA:  Closed compression fracture L4 vertebral body. Recent fall. Severe back pain. EXAM: MRI LUMBAR SPINE WITHOUT CONTRAST TECHNIQUE: Multiplanar, multisequence MR imaging of the lumbar spine was performed. No intravenous contrast was administered. COMPARISON:  MRI lumbar spine 10/08/2017, intraoperative cross-table radiograph 01/27/2018 FINDINGS: Segmentation:  Normal Alignment:  Normal Vertebrae: Chronic compression fracture with cement vertebral augmentation at L2 unchanged from the prior study. New fracture of the L4 vertebral body. Mild to moderate loss of height with intraosseous fluid  collection left compatible with hematoma in the bone. Conus medullaris and cauda equina: Conus extends to the T12 level. Conus and cauda equina appear normal. Paraspinal and other soft tissues: Edema in the psoas muscle bilaterally. 20 mm fluid collection left psoas muscle with multiple fluid levels likely hematoma. 15 mm fluid collection right psoas muscle. These were not present previously. This is presumably related to the L4 fracture. Disc levels: T12-L1: Mild disc and mild facet degeneration. Negative for stenosis L1-2: Mild-to-moderate spinal stenosis which has progressed in the interval. Disc degeneration and bilateral facet hypertrophy. L2-3: Interval decompressive laminectomy. Chronic fracture of L2 with retropulsion of bone into the canal. There is moderate spinal stenosis  and moderate to severe subarticular and foraminal stenosis bilaterally. L3-4: Moderate to advanced facet degeneration. Mild disc degeneration. Moderate spinal stenosis which has progressed in the interval. Moderate subarticular stenosis bilaterally L4-5: Severe facet degeneration bilaterally. Moderate spinal stenosis has progressed in the interval. Severe subarticular stenosis bilaterally has progressed L5-S1: Mild facet degeneration bilaterally. IMPRESSION: Chronic compression fracture of L2 with cement vertebral augmentation Interval development of moderate compression fracture of L4. Intraosseous fluid collection left compatible with hematoma. Edema and fluid collections in the psoas muscle bilaterally most likely hematoma. Infection is a secondary consideration, recommend correlation with fever and white blood count. Interval laminectomy L2-3. Moderate spinal stenosis at L2-3 with moderate to severe subarticular and foraminal stenosis bilaterally Moderate spinal stenosis L3-4 with progression Moderate spinal stenosis at L4-5 with progression with severe subarticular stenosis bilaterally. Electronically Signed   By: Franchot Gallo  M.D.   On: 04/19/2019 12:44    Assessment & Plan:   Leotha was seen today for hypertension and diabetes.  Diagnoses and all orders for this visit:  Essential hypertension- His blood pressure is adequately well controlled.  I will monitor his electrolytes and renal function. -     Basic metabolic panel; Future  Type II diabetes mellitus with manifestations ([place])- His A1c is at 7.2%.  He has good glycemic control considering his age.  Will continue the DPP 4 inhibitor and will monitor for diabetic nephropathy. -     POCT glycosylated hemoglobin (Hb A1C) -     sitaGLIPtin (JANUVIA) 100 MG tablet; Take 1 tablet (100 mg total) by mouth daily. -     Basic metabolic panel; Future -     Microalbumin / creatinine urine ratio; Future   I am having Ryan Duke maintain his multivitamin with minerals, acetaminophen, ibuprofen, meclizine, LORazepam, DULoxetine, atenolol-chlorthalidone, tamsulosin, pravastatin, losartan, gabapentin, HYDROcodone-acetaminophen, and sitaGLIPtin.  Meds ordered this encounter  Medications  . sitaGLIPtin (JANUVIA) 100 MG tablet    Sig: Take 1 tablet (100 mg total) by mouth daily.    Dispense:  90 tablet    Refill:  1     Follow-up: Return in about 6 months (around 12/02/2019).  [name], MD

## 2019-06-08 ENCOUNTER — Telehealth: Payer: Self-pay | Admitting: Internal Medicine

## 2019-06-08 ENCOUNTER — Other Ambulatory Visit: Payer: Self-pay | Admitting: Internal Medicine

## 2019-06-08 DIAGNOSIS — N3281 Overactive bladder: Secondary | ICD-10-CM

## 2019-06-08 MED ORDER — MIRABEGRON ER 25 MG PO TB24: 25.0000 mg | ORAL_TABLET | Freq: Every day | ORAL | 0 refills | Status: DC

## 2019-06-08 NOTE — Telephone Encounter (Signed)
Pt called stating PCP was supposed to send over medication for frequent urination. Please advise.   [facility] [place] [address]  Phone: [phone] Fax: [phone]  Not a 24 hour pharmacy; exact hours not known.

## 2019-06-08 NOTE — Telephone Encounter (Signed)
RX sent

## 2019-06-23 DIAGNOSIS — M8008XD Age-related osteoporosis with current pathological fracture, vertebra(e), subsequent encounter for fracture with routine healing: Secondary | ICD-10-CM | POA: Diagnosis not present

## 2019-06-23 DIAGNOSIS — M4726 Other spondylosis with radiculopathy, lumbar region: Secondary | ICD-10-CM | POA: Diagnosis not present

## 2019-06-23 DIAGNOSIS — M48062 Spinal stenosis, lumbar region with neurogenic claudication: Secondary | ICD-10-CM | POA: Diagnosis not present

## 2019-06-27 ENCOUNTER — Other Ambulatory Visit: Payer: Self-pay | Admitting: Orthopedic Surgery

## 2019-06-27 DIAGNOSIS — M4726 Other spondylosis with radiculopathy, lumbar region: Secondary | ICD-10-CM

## 2019-06-28 ENCOUNTER — Other Ambulatory Visit: Payer: Self-pay

## 2019-06-28 ENCOUNTER — Ambulatory Visit
Admission: RE | Admit: 2019-06-28 | Discharge: 2019-06-28 | Disposition: A | Discharge status: Home / Self Care | Payer: Medicare Other | Source: Ambulatory Visit | Attending: Orthopedic Surgery | Admitting: Orthopedic Surgery

## 2019-06-28 DIAGNOSIS — H1851 Endothelial corneal dystrophy: Secondary | ICD-10-CM | POA: Diagnosis not present

## 2019-06-28 DIAGNOSIS — Z9842 Cataract extraction status, left eye: Secondary | ICD-10-CM | POA: Diagnosis not present

## 2019-06-28 DIAGNOSIS — M48061 Spinal stenosis, lumbar region without neurogenic claudication: Secondary | ICD-10-CM | POA: Diagnosis not present

## 2019-06-28 DIAGNOSIS — Z961 Presence of intraocular lens: Secondary | ICD-10-CM | POA: Diagnosis not present

## 2019-06-28 DIAGNOSIS — M4726 Other spondylosis with radiculopathy, lumbar region: Secondary | ICD-10-CM

## 2019-06-28 DIAGNOSIS — Z9841 Cataract extraction status, right eye: Secondary | ICD-10-CM | POA: Diagnosis not present

## 2019-07-14 DIAGNOSIS — M48062 Spinal stenosis, lumbar region with neurogenic claudication: Secondary | ICD-10-CM | POA: Diagnosis not present

## 2019-07-14 DIAGNOSIS — M4726 Other spondylosis with radiculopathy, lumbar region: Secondary | ICD-10-CM | POA: Diagnosis not present

## 2019-07-14 DIAGNOSIS — S32040S Wedge compression fracture of fourth lumbar vertebra, sequela: Secondary | ICD-10-CM | POA: Diagnosis not present

## 2019-07-14 DIAGNOSIS — M8008XD Age-related osteoporosis with current pathological fracture, vertebra(e), subsequent encounter for fracture with routine healing: Secondary | ICD-10-CM | POA: Diagnosis not present

## 2019-08-05 IMAGING — CR DG LUMBAR SPINE COMPLETE 4+V
5 series · 5 of 5 positions shown · non-contrast
Comparison: Prior lumbar spine radiographs 08/20/2017

CLINICAL DATA: 76-year-old male with lumbar spine pain, altered
mental status and confusion

EXAM:
LUMBAR SPINE - COMPLETE 4+ VIEW

[t lumbar spine ap]
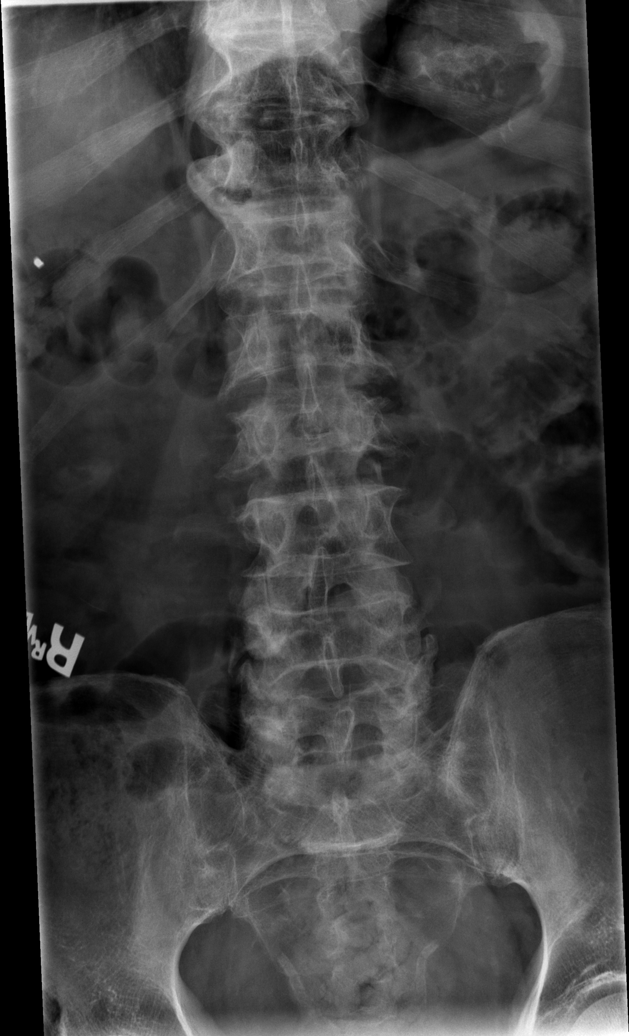

[t lumbar spine obl (1 of 2)]
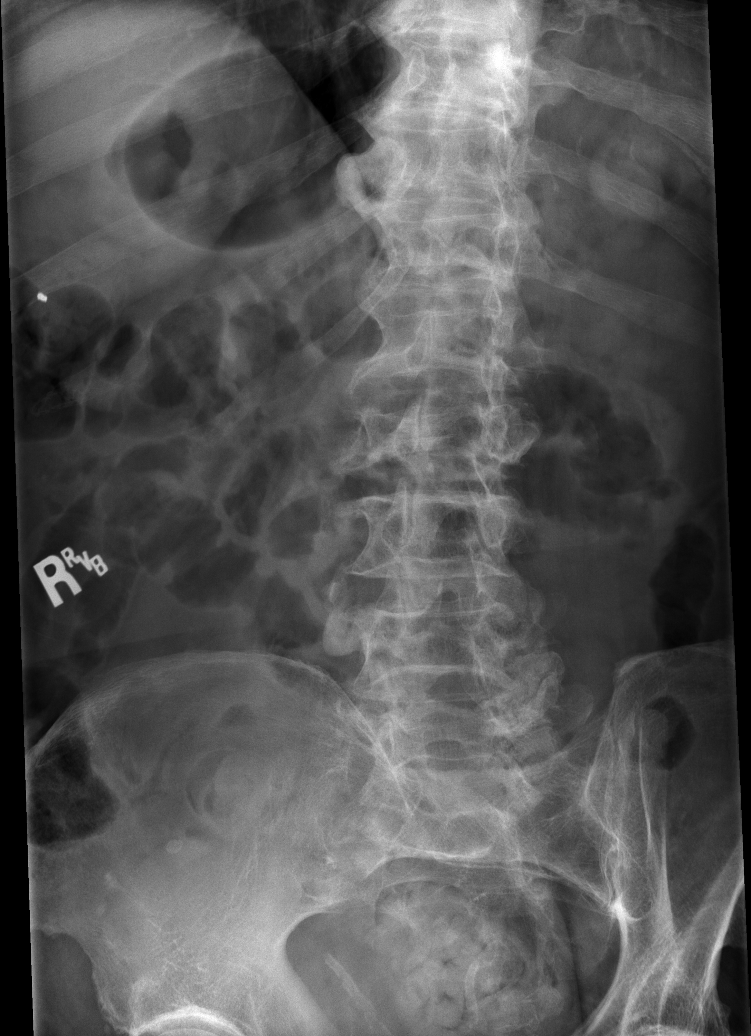

[t lumbar spine obl (2 of 2)]
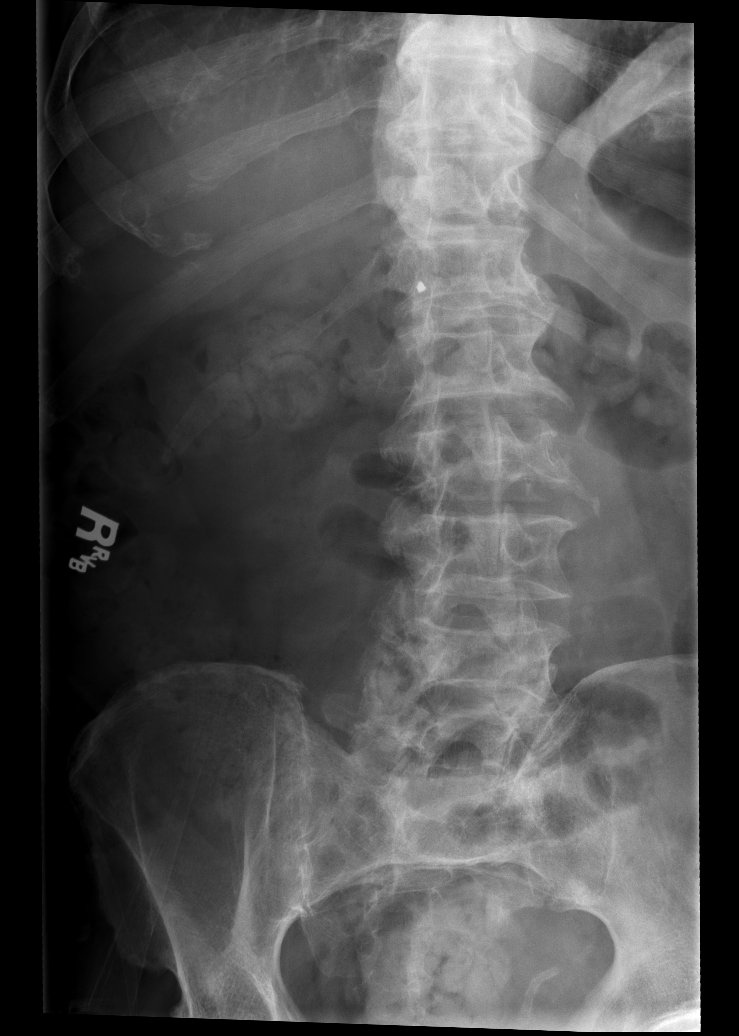

[w lumbar spine lat (1 of 2)]
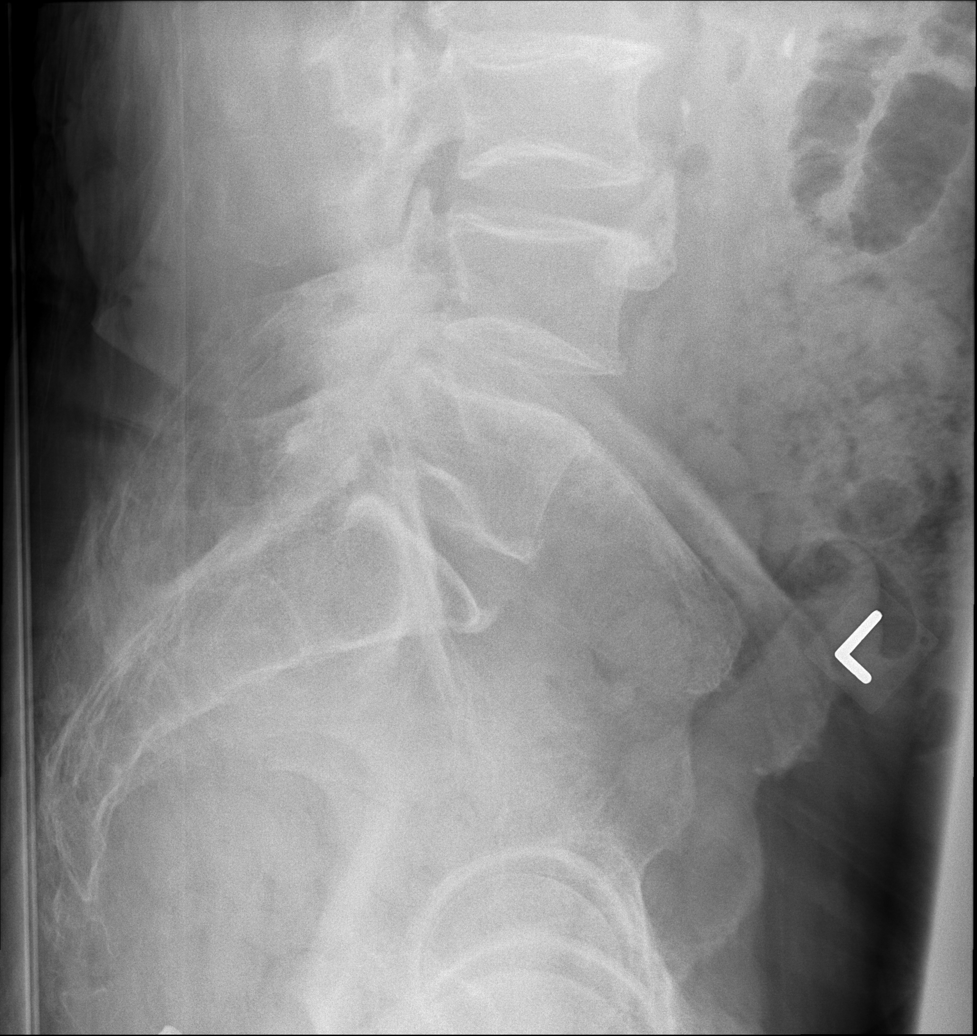

[w lumbar spine lat (2 of 2)]
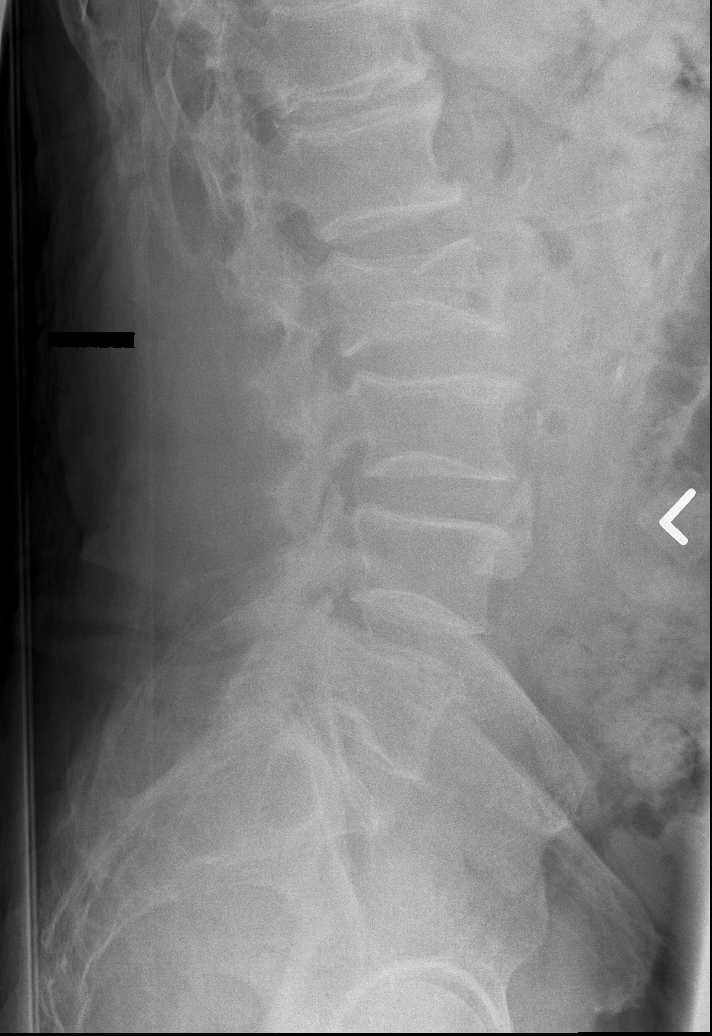

[5 of 5 positions shown; findings below may reference images not displayed]

FINDINGS: Slight interval progression of height loss of the L2 compression
fracture which is now greater than 50% centrally. No new compression
fracture identified. Stable multilevel degenerative disc disease and
advanced lower lumbar facet arthropathy. Stable mild grade 1
anterolisthesis of L4 on L5. The visualized bowel gas pattern is
unremarkable.
IMPRESSION: 1. Slight interval progression of height loss of the previously
noted L2 compression fracture now representing greater than 50%
height loss within the central aspect of the vertebral body.
2. No new fracture or malalignment.
3. Multilevel degenerative disc disease and lower lumbar facet
arthropathy.

## 2019-08-14 DIAGNOSIS — M48062 Spinal stenosis, lumbar region with neurogenic claudication: Secondary | ICD-10-CM | POA: Diagnosis not present

## 2019-08-14 DIAGNOSIS — M545 Low back pain: Secondary | ICD-10-CM | POA: Diagnosis not present

## 2019-08-14 DIAGNOSIS — R2681 Unsteadiness on feet: Secondary | ICD-10-CM | POA: Diagnosis not present

## 2019-08-16 DIAGNOSIS — M545 Low back pain: Secondary | ICD-10-CM | POA: Diagnosis not present

## 2019-08-16 DIAGNOSIS — M48062 Spinal stenosis, lumbar region with neurogenic claudication: Secondary | ICD-10-CM | POA: Diagnosis not present

## 2019-08-16 DIAGNOSIS — R2681 Unsteadiness on feet: Secondary | ICD-10-CM | POA: Diagnosis not present

## 2019-08-18 DIAGNOSIS — M545 Low back pain: Secondary | ICD-10-CM | POA: Diagnosis not present

## 2019-08-18 DIAGNOSIS — M48062 Spinal stenosis, lumbar region with neurogenic claudication: Secondary | ICD-10-CM | POA: Diagnosis not present

## 2019-08-18 DIAGNOSIS — R2681 Unsteadiness on feet: Secondary | ICD-10-CM | POA: Diagnosis not present

## 2019-08-21 DIAGNOSIS — M48062 Spinal stenosis, lumbar region with neurogenic claudication: Secondary | ICD-10-CM | POA: Diagnosis not present

## 2019-08-21 DIAGNOSIS — M545 Low back pain: Secondary | ICD-10-CM | POA: Diagnosis not present

## 2019-08-21 DIAGNOSIS — R2681 Unsteadiness on feet: Secondary | ICD-10-CM | POA: Diagnosis not present

## 2019-08-23 DIAGNOSIS — M545 Low back pain: Secondary | ICD-10-CM | POA: Diagnosis not present

## 2019-08-23 DIAGNOSIS — M48062 Spinal stenosis, lumbar region with neurogenic claudication: Secondary | ICD-10-CM | POA: Diagnosis not present

## 2019-08-23 DIAGNOSIS — R2681 Unsteadiness on feet: Secondary | ICD-10-CM | POA: Diagnosis not present

## 2019-08-25 DIAGNOSIS — M545 Low back pain: Secondary | ICD-10-CM | POA: Diagnosis not present

## 2019-08-25 DIAGNOSIS — M48062 Spinal stenosis, lumbar region with neurogenic claudication: Secondary | ICD-10-CM | POA: Diagnosis not present

## 2019-08-25 DIAGNOSIS — R2681 Unsteadiness on feet: Secondary | ICD-10-CM | POA: Diagnosis not present

## 2019-08-29 DIAGNOSIS — M48062 Spinal stenosis, lumbar region with neurogenic claudication: Secondary | ICD-10-CM | POA: Diagnosis not present

## 2019-08-29 DIAGNOSIS — M545 Low back pain: Secondary | ICD-10-CM | POA: Diagnosis not present

## 2019-08-29 DIAGNOSIS — R2681 Unsteadiness on feet: Secondary | ICD-10-CM | POA: Diagnosis not present

## 2019-08-31 DIAGNOSIS — M48062 Spinal stenosis, lumbar region with neurogenic claudication: Secondary | ICD-10-CM | POA: Diagnosis not present

## 2019-08-31 DIAGNOSIS — R2681 Unsteadiness on feet: Secondary | ICD-10-CM | POA: Diagnosis not present

## 2019-08-31 DIAGNOSIS — M545 Low back pain: Secondary | ICD-10-CM | POA: Diagnosis not present

## 2019-09-05 DIAGNOSIS — M545 Low back pain: Secondary | ICD-10-CM | POA: Diagnosis not present

## 2019-09-05 DIAGNOSIS — M48062 Spinal stenosis, lumbar region with neurogenic claudication: Secondary | ICD-10-CM | POA: Diagnosis not present

## 2019-09-05 DIAGNOSIS — R2681 Unsteadiness on feet: Secondary | ICD-10-CM | POA: Diagnosis not present

## 2019-09-07 DIAGNOSIS — M48062 Spinal stenosis, lumbar region with neurogenic claudication: Secondary | ICD-10-CM | POA: Diagnosis not present

## 2019-09-07 DIAGNOSIS — M545 Low back pain: Secondary | ICD-10-CM | POA: Diagnosis not present

## 2019-09-07 DIAGNOSIS — R2681 Unsteadiness on feet: Secondary | ICD-10-CM | POA: Diagnosis not present

## 2019-09-12 ENCOUNTER — Other Ambulatory Visit: Payer: Self-pay

## 2019-09-12 ENCOUNTER — Ambulatory Visit (INDEPENDENT_AMBULATORY_CARE_PROVIDER_SITE_OTHER): Payer: Medicare Other

## 2019-09-12 DIAGNOSIS — M48062 Spinal stenosis, lumbar region with neurogenic claudication: Secondary | ICD-10-CM | POA: Diagnosis not present

## 2019-09-12 DIAGNOSIS — Z23 Encounter for immunization: Secondary | ICD-10-CM

## 2019-09-12 DIAGNOSIS — M545 Low back pain: Secondary | ICD-10-CM | POA: Diagnosis not present

## 2019-09-12 DIAGNOSIS — R2681 Unsteadiness on feet: Secondary | ICD-10-CM | POA: Diagnosis not present

## 2019-09-15 DIAGNOSIS — M48062 Spinal stenosis, lumbar region with neurogenic claudication: Secondary | ICD-10-CM | POA: Diagnosis not present

## 2019-09-15 DIAGNOSIS — R2681 Unsteadiness on feet: Secondary | ICD-10-CM | POA: Diagnosis not present

## 2019-09-15 DIAGNOSIS — M545 Low back pain: Secondary | ICD-10-CM | POA: Diagnosis not present

## 2019-09-19 DIAGNOSIS — M545 Low back pain: Secondary | ICD-10-CM | POA: Diagnosis not present

## 2019-09-19 DIAGNOSIS — M48062 Spinal stenosis, lumbar region with neurogenic claudication: Secondary | ICD-10-CM | POA: Diagnosis not present

## 2019-09-19 DIAGNOSIS — R2681 Unsteadiness on feet: Secondary | ICD-10-CM | POA: Diagnosis not present

## 2019-09-21 DIAGNOSIS — M545 Low back pain: Secondary | ICD-10-CM | POA: Diagnosis not present

## 2019-09-21 DIAGNOSIS — M48062 Spinal stenosis, lumbar region with neurogenic claudication: Secondary | ICD-10-CM | POA: Diagnosis not present

## 2019-09-21 DIAGNOSIS — R2681 Unsteadiness on feet: Secondary | ICD-10-CM | POA: Diagnosis not present

## 2019-09-25 DIAGNOSIS — M48062 Spinal stenosis, lumbar region with neurogenic claudication: Secondary | ICD-10-CM | POA: Diagnosis not present

## 2019-09-25 DIAGNOSIS — M545 Low back pain: Secondary | ICD-10-CM | POA: Diagnosis not present

## 2019-09-25 DIAGNOSIS — R2681 Unsteadiness on feet: Secondary | ICD-10-CM | POA: Diagnosis not present

## 2019-09-26 ENCOUNTER — Other Ambulatory Visit: Payer: Self-pay | Admitting: Internal Medicine

## 2019-09-26 DIAGNOSIS — N3281 Overactive bladder: Secondary | ICD-10-CM

## 2019-09-27 ENCOUNTER — Ambulatory Visit: Payer: Self-pay

## 2019-09-27 DIAGNOSIS — M545 Low back pain: Secondary | ICD-10-CM | POA: Diagnosis not present

## 2019-09-27 DIAGNOSIS — R2681 Unsteadiness on feet: Secondary | ICD-10-CM | POA: Diagnosis not present

## 2019-09-27 DIAGNOSIS — M48062 Spinal stenosis, lumbar region with neurogenic claudication: Secondary | ICD-10-CM | POA: Diagnosis not present

## 2019-09-27 NOTE — Telephone Encounter (Signed)
Patient called stating that he woke up today and felt dizzy and nauseated. He states he went to Physical therapy and they tried maneuvers to correct vertigo but were unsuccessful. He states that the room spins when he stands.  He feels nauseated but had only dry heaves. He has taken dramamine N. He is sitting as we talk and denies symptoms unless he moves. He has Hx of hypertension and diabetes. He is in physical therapy because of a fall Per protocol patient was told to go to the ER for evaluation. Care advice read to patient. Patient verbalized understanding but states he wants appointment. NT explained that ER care was advised. He verbalized understanding. NT unsure patient will go. He states he does have someone to drive him.  Reason for Disposition . [1] Dizziness (vertigo) present now AND [2] one or more stroke risk factors (i.e., hypertension, diabetes, prior stroke/TIA, heart attack)  (Exception: prior physician evaluation for this AND no different/worse than usual)  Answer Assessment - Initial Assessment Questions 1. DESCRIPTION: "Describe your dizziness."     vertigo 2. VERTIGO: "Do you feel like either you or the room is spinning or tilting?"      spinning 3. LIGHTHEADED: "Do you feel lightheaded?" (e.g., somewhat faint, woozy, weak upon standing)     lighheaded 4. SEVERITY: "How bad is it?"  "Can you walk?"   - MILD - Feels unsteady but walking normally.   - MODERATE - Feels very unsteady when walking, but not falling; interferes with normal activities (e.g., school, work) .   - SEVERE - Unable to walk without falling (requires assistance).    Sometimes unsteady 5. ONSET:  "When did the dizziness begin?"    Today early 6. AGGRAVATING FACTORS: "Does anything make it worse?" (e.g., standing, change in head position)     standing 7. CAUSE: "What do you think is causing the dizziness?"     Unsure thinks its lyrica 8. RECURRENT SYMPTOM: "Have you had dizziness before?" If so, ask: "When  was the last time?" "What happened that time?"     never 9. OTHER SYMPTOMS: "Do you have any other symptoms?" (e.g., headache, weakness, numbness, vomiting, earache)     nausea 10. PREGNANCY: "Is there any chance you are pregnant?" "When was your last menstrual period?"      N/A  Protocols used: DIZZINESS - VERTIGO-A-AH

## 2019-09-28 ENCOUNTER — Encounter: Payer: Self-pay | Admitting: Internal Medicine

## 2019-09-28 ENCOUNTER — Other Ambulatory Visit: Payer: Self-pay

## 2019-09-28 ENCOUNTER — Ambulatory Visit (INDEPENDENT_AMBULATORY_CARE_PROVIDER_SITE_OTHER): Payer: Medicare Other | Admitting: Internal Medicine

## 2019-09-28 VITALS — BP 126/78 | HR 63 | Temp 98.2°F | Resp 16 | Ht 71.0 in | Wt 226.0 lb

## 2019-09-28 DIAGNOSIS — E118 Type 2 diabetes mellitus with unspecified complications: Secondary | ICD-10-CM

## 2019-09-28 DIAGNOSIS — H8113 Benign paroxysmal vertigo, bilateral: Secondary | ICD-10-CM | POA: Diagnosis not present

## 2019-09-28 DIAGNOSIS — I10 Essential (primary) hypertension: Secondary | ICD-10-CM | POA: Diagnosis not present

## 2019-09-28 DIAGNOSIS — N1831 Chronic kidney disease, stage 3a: Secondary | ICD-10-CM

## 2019-09-28 DIAGNOSIS — Z0000 Encounter for general adult medical examination without abnormal findings: Secondary | ICD-10-CM

## 2019-09-28 DIAGNOSIS — E559 Vitamin D deficiency, unspecified: Secondary | ICD-10-CM

## 2019-09-28 DIAGNOSIS — M1000 Idiopathic gout, unspecified site: Secondary | ICD-10-CM

## 2019-09-28 MED ORDER — MECLIZINE HCL 12.5 MG PO TABS: 25.0000 mg | ORAL_TABLET | Freq: Three times a day (TID) | ORAL | 1 refills | Status: DC | PRN

## 2019-09-28 NOTE — Patient Instructions (Signed)

## 2019-09-28 NOTE — Telephone Encounter (Signed)
Pt contacted - did not see where pt went to ED as directed -  Pt stated that he would not go to ED due to the pandemic. I have scheduled pt today for his symptoms.

## 2019-09-28 NOTE — Progress Notes (Signed)
Subjective:  Patient ID: Ryan Duke, male    DOB: Aug 07, 1941  Age: 78 y.o. MRN: [place]:[id number]  CC: Dizziness   HPI Ryan Duke presents for concerns about a 2-day history of dizziness, vertigo, and dry heaves.  He has not vomited.  He has his usual degree of ataxia with no worsening of his gait over the last day.  He tells me that as of this visit his symptoms are gradually improving.  He tried some Dramamine but says it did not help much.  He denies headache, vomiting, neck pain, or new paresthesias.  Outpatient Medications Prior to Visit  Medication Sig Dispense Refill   acetaminophen (TYLENOL) 500 MG tablet Take 1,000 mg by mouth 2 (two) times daily as needed for moderate pain.     atenolol-chlorthalidone (TENORETIC) 100-25 MG tablet TAKE 1 TABLET ONCE DAILY. 90 tablet 0   DULoxetine (CYMBALTA) 60 MG capsule TAKE (1) CAPSULE DAILY. 90 capsule 0   gabapentin (NEURONTIN) 300 MG capsule Take 1 capsule by mouth 3 (three) times daily.     HYDROcodone-acetaminophen (NORCO/VICODIN) 5-325 MG tablet Take 1 tablet by mouth as needed.     ibuprofen (ADVIL,MOTRIN) 200 MG tablet Take 400 mg by mouth every 6 (six) hours as needed for moderate pain.     LORazepam (ATIVAN) 0.5 MG tablet Take 1 tablet (0.5 mg total) by mouth 2 (two) times daily as needed (Dizziness). 5 tablet 0   losartan (COZAAR) 100 MG tablet TAKE 1 TABLET DAILY FOR HYPERTENSION. 90 tablet 0   Multiple Vitamin (MULTIVITAMIN WITH MINERALS) TABS tablet Take 1 tablet by mouth 2 (two) times a week.      MYRBETRIQ 25 MG TB24 tablet TAKE 1 TABLET ONCE DAILY. 90 tablet 1   pravastatin (PRAVACHOL) 40 MG tablet TAKE 1 TABLET ONCE DAILY. 90 tablet 0   sitaGLIPtin (JANUVIA) 100 MG tablet Take 1 tablet (100 mg total) by mouth daily. 90 tablet 1   tamsulosin (FLOMAX) 0.4 MG CAPS capsule TAKE (1) CAPSULE DAILY. 90 capsule 0   meclizine (ANTIVERT) 12.5 MG tablet Take 1 tablet (12.5 mg total) by mouth 3 (three) times daily as  needed for dizziness. 8 tablet 0   No facility-administered medications prior to visit.     ROS Review of Systems  Constitutional: Negative.  Negative for chills, fatigue and fever.  HENT: Negative.  Negative for sore throat and trouble swallowing.   Eyes: Negative.  Negative for visual disturbance.  Respiratory: Negative for cough, chest tightness, shortness of breath and wheezing.   Cardiovascular: Negative for chest pain, palpitations and leg swelling.  Gastrointestinal: Positive for nausea. Negative for abdominal pain, diarrhea and vomiting.  Endocrine: Negative.   Genitourinary: Negative.   Musculoskeletal: Positive for arthralgias. Negative for back pain and myalgias.  Skin: Negative for color change.  Neurological: Positive for dizziness. Negative for facial asymmetry, weakness, light-headedness, numbness and headaches.  Hematological: Negative.   Psychiatric/Behavioral: Negative.     Objective:  BP 126/78 (BP Location: Left Arm, Patient Position: Sitting, Cuff Size: Normal)    Pulse 63    Temp 98.2 F (36.8 C) (Oral)    Resp 16    Ht 5\' 11"  (1.803 m)    Wt 226 lb (102.5 kg)    SpO2 92%    BMI 31.52 kg/m   BP Readings from Last 3 Encounters:  09/28/19 126/78  06/01/19 126/74  04/11/19 (!) 151/79    Wt Readings from Last 3 Encounters:  09/28/19 226 lb (102.5 kg)  06/01/19 212 lb (96.2 kg)  04/11/19 230 lb (104.3 kg)    Physical Exam Vitals signs reviewed.  Constitutional:      General: He is not in acute distress.    Appearance: Normal appearance. He is not ill-appearing, toxic-appearing or diaphoretic.  Eyes:     General: No scleral icterus.    Extraocular Movements: Extraocular movements intact.     Conjunctiva/sclera: Conjunctivae normal.     Pupils: Pupils are equal, round, and reactive to light.  Neck:     Musculoskeletal: Neck supple.  Cardiovascular:     Rate and Rhythm: Normal rate and regular rhythm.     Heart sounds: Murmur present. Systolic murmur  present with a grade of 1/6. No diastolic murmur. No gallop.   Pulmonary:     Effort: Pulmonary effort is normal.     Breath sounds: No stridor. No wheezing, rhonchi or rales.  Abdominal:     General: Abdomen is protuberant. Bowel sounds are normal. There is no distension.     Palpations: There is no hepatomegaly or splenomegaly.  Musculoskeletal: Normal range of motion.     Right lower leg: No edema.     Left lower leg: No edema.  Skin:    General: Skin is warm and dry.  Neurological:     Mental Status: He is alert.     Cranial Nerves: Cranial nerves are intact.     Sensory: Sensation is intact.     Motor: Motor function is intact. No atrophy.     Coordination: Romberg sign positive. Coordination abnormal.     Deep Tendon Reflexes: Reflexes normal. Babinski sign absent on the right side. Babinski sign absent on the left side.     Reflex Scores:      Tricep reflexes are 0 on the right side and 0 on the left side.      Bicep reflexes are 0 on the right side and 0 on the left side.      Brachioradialis reflexes are 0 on the right side and 0 on the left side.      Patellar reflexes are 0 on the right side and 0 on the left side.      Achilles reflexes are 0 on the right side and 0 on the left side.    Comments: His ataxia is at its baseline     Lab Results  Component Value Date   WBC 8.7 01/27/2019   HGB 14.0 01/27/2019   HCT 44.2 01/27/2019   PLT 232 01/27/2019   GLUCOSE 158 (H) 01/27/2019   CHOL 145 11/17/2018   TRIG 94.0 11/17/2018   HDL 43.30 11/17/2018   LDLDIRECT 81.0 05/26/2018   LDLCALC 83 11/17/2018   ALT 12 11/17/2018   AST 14 11/17/2018   NA 139 01/27/2019   K 3.9 01/27/2019   CL 102 01/27/2019   CREATININE 1.31 (H) 01/27/2019   BUN 20 01/27/2019   CO2 29 01/27/2019   TSH 2.47 02/15/2018   PSA 1.05 10/28/2015   INR 1.04 09/14/2017   HGBA1C 7.2 (A) 06/01/2019   MICROALBUR 14.5 (H) 05/26/2018    Mr Lumbar Spine Wo Contrast  Result Date:  06/29/2019 CLINICAL DATA:  Vertebral compression fractures status post kyphoplasty. Persistent severe back pain with right-sided radiculopathy EXAM: MRI LUMBAR SPINE WITHOUT CONTRAST TECHNIQUE: Multiplanar, multisequence MR imaging of the lumbar spine was performed. No intravenous contrast was administered. COMPARISON:  MRI 04/19/2019 FINDINGS: Segmentation:  Standard. Alignment:  Unchanged. Vertebrae: Chronic vertebral body compression fracture of  L2 status post cement augmentation without change compared to the previous MRI. Interval progression in the degree of vertebral body height loss of a subacute L4 vertebral body compression fracture, now with greater than 50% height loss. Mild bony retropulsion the superior aspect of the L4 vertebral body. There is persistent marrow edema within the vertebral body and extending into the pedicles bilaterally. Low T1/T2 signal centrally within the vertebral body suggesting cement augmentation. No new fracture. Discogenic marrow changes throughout the remaining lumbar levels. Conus medullaris and cauda equina: Conus extends to the T12-L1 level. Conus and cauda equina appear normal. Paraspinal and other soft tissues: The previously seen bilateral psoas muscle fluid collections have resolved. Remaining paravertebral soft tissues are within normal limits. Disc levels: T12-L1: Mild diffuse disc bulge with left greater than right facet arthrosis. No foraminal or canal stenosis. Unchanged. L1-L2: Mild diffuse disc bulge with bilateral facet arthrosis results in mild canal stenosis without foraminal narrowing. Unchanged. L2-L3: Bony retropulsion from chronic L2 compression fracture which results in moderate canal stenosis and moderate to severe stenosis of the lateral recesses and neural foramen bilaterally. Unchanged. L3-L4: Mild bony retropulsion of the L4 vertebral body, progressed from prior. Mild diffuse disc bulge and bilateral facet arthrosis resulting in moderate canal  stenosis with moderate stenosis of the lateral recesses and neural foramen bilaterally. The degree of canal stenosis has slightly progressed secondary to the L4 vertebral retropulsion. L4-L5: No significant disc protrusion. Severe bilateral facet arthrosis results in severe narrowing of the bilateral neural foramen and moderate narrowing of the canal and bilateral subarticular recesses. Unchanged. L5-S1: Bilateral facet arthropathy without foraminal or canal stenosis. Unchanged. IMPRESSION: 1. Interval progression of L4 vertebral body compression fracture, now with greater than 50% vertebral body height loss. There is also slight increased bony retropulsion this level resulting in moderate canal stenosis, slightly progressed from prior. 2. Low signal changes within the L4 vertebral body, favored to represent interval cement augmentation. Correlate with interval procedural history. 3. Interval resolution of previously seen bilateral psoas muscle fluid collections, likely resolved intramuscular hematomas. 4. 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 5. Multilevel lumbar spondylosis at the remaining vertebral levels including moderate canal stenosis at L2-3 and L4-5, not significantly progressed compared to the prior MRI 04/18/2019. Electronically Signed   By: Davina Poke M.D.   On: 06/29/2019 10:09    Assessment & Plan:   Jarmall was seen today for dizziness.  Diagnoses and all orders for this visit:  Type II diabetes mellitus with manifestations ([place])  Stage 3a chronic kidney disease  Idiopathic gout, unspecified chronicity, unspecified site  Routine general medical examination at a health care facility  Vitamin D deficiency  Essential hypertension- His blood pressure is adequately well controlled.  Vertigo, benign paroxysmal, bilateral- His symptoms and exam are consistent with benign vertigo.  I recommended that he try meclizine as needed and to let me  know if he develops any new or worsening symptoms. -     meclizine (ANTIVERT) 12.5 MG tablet; Take 2 tablets (25 mg total) by mouth 3 (three) times daily as needed for dizziness.   I have changed Ryan Duke's meclizine. I am also having him maintain his multivitamin with minerals, acetaminophen, ibuprofen, LORazepam, DULoxetine, atenolol-chlorthalidone, tamsulosin, pravastatin, losartan, gabapentin, HYDROcodone-acetaminophen, sitaGLIPtin, and Myrbetriq.  Meds ordered this encounter  Medications   meclizine (ANTIVERT) 12.5 MG tablet    Sig: Take 2 tablets (25 mg total) by mouth 3 (three) times daily as needed for dizziness.  Dispense:  45 tablet    Refill:  1     Follow-up: Return in about 4 weeks (around 10/26/2019).  [name], MD

## 2019-10-03 DIAGNOSIS — M48062 Spinal stenosis, lumbar region with neurogenic claudication: Secondary | ICD-10-CM | POA: Diagnosis not present

## 2019-10-03 DIAGNOSIS — R2681 Unsteadiness on feet: Secondary | ICD-10-CM | POA: Diagnosis not present

## 2019-10-03 DIAGNOSIS — M545 Low back pain: Secondary | ICD-10-CM | POA: Diagnosis not present

## 2019-10-05 DIAGNOSIS — M545 Low back pain: Secondary | ICD-10-CM | POA: Diagnosis not present

## 2019-10-05 DIAGNOSIS — R2681 Unsteadiness on feet: Secondary | ICD-10-CM | POA: Diagnosis not present

## 2019-10-05 DIAGNOSIS — M48062 Spinal stenosis, lumbar region with neurogenic claudication: Secondary | ICD-10-CM | POA: Diagnosis not present

## 2019-10-11 DIAGNOSIS — S32040S Wedge compression fracture of fourth lumbar vertebra, sequela: Secondary | ICD-10-CM | POA: Diagnosis not present

## 2019-10-11 DIAGNOSIS — M48062 Spinal stenosis, lumbar region with neurogenic claudication: Secondary | ICD-10-CM | POA: Diagnosis not present

## 2019-10-11 DIAGNOSIS — M8008XD Age-related osteoporosis with current pathological fracture, vertebra(e), subsequent encounter for fracture with routine healing: Secondary | ICD-10-CM | POA: Diagnosis not present

## 2019-10-11 DIAGNOSIS — M4726 Other spondylosis with radiculopathy, lumbar region: Secondary | ICD-10-CM | POA: Diagnosis not present

## 2019-10-16 DIAGNOSIS — M545 Low back pain: Secondary | ICD-10-CM | POA: Diagnosis not present

## 2019-10-16 DIAGNOSIS — M48062 Spinal stenosis, lumbar region with neurogenic claudication: Secondary | ICD-10-CM | POA: Diagnosis not present

## 2019-10-16 DIAGNOSIS — R2681 Unsteadiness on feet: Secondary | ICD-10-CM | POA: Diagnosis not present

## 2019-10-18 DIAGNOSIS — R2681 Unsteadiness on feet: Secondary | ICD-10-CM | POA: Diagnosis not present

## 2019-10-18 DIAGNOSIS — M48062 Spinal stenosis, lumbar region with neurogenic claudication: Secondary | ICD-10-CM | POA: Diagnosis not present

## 2019-10-18 DIAGNOSIS — M545 Low back pain: Secondary | ICD-10-CM | POA: Diagnosis not present

## 2019-10-24 DIAGNOSIS — R2681 Unsteadiness on feet: Secondary | ICD-10-CM | POA: Diagnosis not present

## 2019-10-24 DIAGNOSIS — M48062 Spinal stenosis, lumbar region with neurogenic claudication: Secondary | ICD-10-CM | POA: Diagnosis not present

## 2019-10-24 DIAGNOSIS — M545 Low back pain: Secondary | ICD-10-CM | POA: Diagnosis not present

## 2019-10-27 DIAGNOSIS — M545 Low back pain: Secondary | ICD-10-CM | POA: Diagnosis not present

## 2019-10-27 DIAGNOSIS — R2681 Unsteadiness on feet: Secondary | ICD-10-CM | POA: Diagnosis not present

## 2019-10-27 DIAGNOSIS — M48062 Spinal stenosis, lumbar region with neurogenic claudication: Secondary | ICD-10-CM | POA: Diagnosis not present

## 2019-10-31 DIAGNOSIS — M545 Low back pain: Secondary | ICD-10-CM | POA: Diagnosis not present

## 2019-10-31 DIAGNOSIS — R2681 Unsteadiness on feet: Secondary | ICD-10-CM | POA: Diagnosis not present

## 2019-10-31 DIAGNOSIS — M48062 Spinal stenosis, lumbar region with neurogenic claudication: Secondary | ICD-10-CM | POA: Diagnosis not present

## 2019-11-01 ENCOUNTER — Encounter: Payer: Self-pay | Admitting: Internal Medicine

## 2019-11-02 DIAGNOSIS — M48062 Spinal stenosis, lumbar region with neurogenic claudication: Secondary | ICD-10-CM | POA: Diagnosis not present

## 2019-11-02 DIAGNOSIS — M545 Low back pain: Secondary | ICD-10-CM | POA: Diagnosis not present

## 2019-11-02 DIAGNOSIS — R2681 Unsteadiness on feet: Secondary | ICD-10-CM | POA: Diagnosis not present

## 2019-11-07 DIAGNOSIS — M545 Low back pain: Secondary | ICD-10-CM | POA: Diagnosis not present

## 2019-11-07 DIAGNOSIS — R2681 Unsteadiness on feet: Secondary | ICD-10-CM | POA: Diagnosis not present

## 2019-11-07 DIAGNOSIS — M48062 Spinal stenosis, lumbar region with neurogenic claudication: Secondary | ICD-10-CM | POA: Diagnosis not present

## 2019-11-09 DIAGNOSIS — R2681 Unsteadiness on feet: Secondary | ICD-10-CM | POA: Diagnosis not present

## 2019-11-09 DIAGNOSIS — M48062 Spinal stenosis, lumbar region with neurogenic claudication: Secondary | ICD-10-CM | POA: Diagnosis not present

## 2019-11-09 DIAGNOSIS — M545 Low back pain: Secondary | ICD-10-CM | POA: Diagnosis not present

## 2019-11-14 DIAGNOSIS — M545 Low back pain: Secondary | ICD-10-CM | POA: Diagnosis not present

## 2019-11-14 DIAGNOSIS — R2681 Unsteadiness on feet: Secondary | ICD-10-CM | POA: Diagnosis not present

## 2019-11-14 DIAGNOSIS — M48062 Spinal stenosis, lumbar region with neurogenic claudication: Secondary | ICD-10-CM | POA: Diagnosis not present

## 2019-11-16 DIAGNOSIS — R2681 Unsteadiness on feet: Secondary | ICD-10-CM | POA: Diagnosis not present

## 2019-11-16 DIAGNOSIS — M545 Low back pain: Secondary | ICD-10-CM | POA: Diagnosis not present

## 2019-11-16 DIAGNOSIS — M48062 Spinal stenosis, lumbar region with neurogenic claudication: Secondary | ICD-10-CM | POA: Diagnosis not present

## 2019-11-20 ENCOUNTER — Other Ambulatory Visit: Payer: Self-pay | Admitting: Internal Medicine

## 2019-11-28 DIAGNOSIS — R2681 Unsteadiness on feet: Secondary | ICD-10-CM | POA: Diagnosis not present

## 2019-11-28 DIAGNOSIS — M48062 Spinal stenosis, lumbar region with neurogenic claudication: Secondary | ICD-10-CM | POA: Diagnosis not present

## 2019-11-28 DIAGNOSIS — M545 Low back pain: Secondary | ICD-10-CM | POA: Diagnosis not present

## 2019-11-30 DIAGNOSIS — M48062 Spinal stenosis, lumbar region with neurogenic claudication: Secondary | ICD-10-CM | POA: Diagnosis not present

## 2019-11-30 DIAGNOSIS — R2681 Unsteadiness on feet: Secondary | ICD-10-CM | POA: Diagnosis not present

## 2019-11-30 DIAGNOSIS — M545 Low back pain: Secondary | ICD-10-CM | POA: Diagnosis not present

## 2019-12-01 HISTORY — PX: EYE SURGERY: SHX253

## 2019-12-04 ENCOUNTER — Encounter: Payer: Self-pay | Admitting: Internal Medicine

## 2019-12-04 ENCOUNTER — Ambulatory Visit (INDEPENDENT_AMBULATORY_CARE_PROVIDER_SITE_OTHER): Payer: Medicare Other | Admitting: Internal Medicine

## 2019-12-04 ENCOUNTER — Other Ambulatory Visit: Payer: Self-pay

## 2019-12-04 VITALS — BP 158/76 | HR 56 | Temp 97.7°F | Resp 16 | Ht 71.0 in | Wt 241.0 lb

## 2019-12-04 DIAGNOSIS — Z0000 Encounter for general adult medical examination without abnormal findings: Secondary | ICD-10-CM | POA: Diagnosis not present

## 2019-12-04 DIAGNOSIS — N3281 Overactive bladder: Secondary | ICD-10-CM

## 2019-12-04 DIAGNOSIS — M1000 Idiopathic gout, unspecified site: Secondary | ICD-10-CM

## 2019-12-04 DIAGNOSIS — E559 Vitamin D deficiency, unspecified: Secondary | ICD-10-CM

## 2019-12-04 DIAGNOSIS — E118 Type 2 diabetes mellitus with unspecified complications: Secondary | ICD-10-CM

## 2019-12-04 DIAGNOSIS — N1831 Chronic kidney disease, stage 3a: Secondary | ICD-10-CM | POA: Diagnosis not present

## 2019-12-04 DIAGNOSIS — B351 Tinea unguium: Secondary | ICD-10-CM

## 2019-12-04 DIAGNOSIS — I10 Essential (primary) hypertension: Secondary | ICD-10-CM | POA: Diagnosis not present

## 2019-12-04 DIAGNOSIS — E785 Hyperlipidemia, unspecified: Secondary | ICD-10-CM | POA: Diagnosis not present

## 2019-12-04 LAB — HEPATIC FUNCTION PANEL
ALT: 10 U/L (ref 0–53)
AST: 13 U/L (ref 0–37)
Albumin: 4 g/dL (ref 3.5–5.2)
Alkaline Phosphatase: 96 U/L (ref 39–117)
Bilirubin, Direct: 0.1 mg/dL (ref 0.0–0.3)
Total Bilirubin: 0.4 mg/dL (ref 0.2–1.2)
Total Protein: 6.6 g/dL (ref 6.0–8.3)

## 2019-12-04 LAB — CBC WITH DIFFERENTIAL/PLATELET
Basophils Absolute: 0.1 10*3/uL (ref 0.0–0.1)
Basophils Relative: 1.1 % (ref 0.0–3.0)
Eosinophils Absolute: 0.4 10*3/uL (ref 0.0–0.7)
Eosinophils Relative: 4.6 % (ref 0.0–5.0)
HCT: 42.8 % (ref 39.0–52.0)
Hemoglobin: 14.3 g/dL (ref 13.0–17.0)
Lymphocytes Relative: 24.3 % (ref 12.0–46.0)
Lymphs Abs: 1.9 10*3/uL (ref 0.7–4.0)
MCHC: 33.4 g/dL (ref 30.0–36.0)
MCV: 88.5 fl (ref 78.0–100.0)
Monocytes Absolute: 0.8 10*3/uL (ref 0.1–1.0)
Monocytes Relative: 9.4 % (ref 3.0–12.0)
Neutro Abs: 4.9 10*3/uL (ref 1.4–7.7)
Neutrophils Relative %: 60.6 % (ref 43.0–77.0)
Platelets: 234 10*3/uL (ref 150.0–400.0)
RBC: 4.84 Mil/uL (ref 4.22–5.81)
RDW: 13.9 % (ref 11.5–15.5)
WBC: 8 10*3/uL (ref 4.0–10.5)

## 2019-12-04 LAB — URINALYSIS, ROUTINE W REFLEX MICROSCOPIC
Bilirubin Urine: NEGATIVE
Hgb urine dipstick: NEGATIVE
Ketones, ur: NEGATIVE
Leukocytes,Ua: NEGATIVE
Nitrite: NEGATIVE
RBC / HPF: NONE SEEN (ref 0–?)
Specific Gravity, Urine: 1.02 (ref 1.000–1.030)
Total Protein, Urine: 30 — AB
Urine Glucose: NEGATIVE
Urobilinogen, UA: 0.2 (ref 0.0–1.0)
WBC, UA: NONE SEEN (ref 0–?)
pH: 6 (ref 5.0–8.0)

## 2019-12-04 LAB — POCT GLYCOSYLATED HEMOGLOBIN (HGB A1C): Hemoglobin A1C: 7.4 % — AB (ref 4.0–5.6)

## 2019-12-04 LAB — BASIC METABOLIC PANEL
BUN: 23 mg/dL (ref 6–23)
CO2: 30 mEq/L (ref 19–32)
Calcium: 9.6 mg/dL (ref 8.4–10.5)
Chloride: 101 mEq/L (ref 96–112)
Creatinine, Ser: 1.47 mg/dL (ref 0.40–1.50)
GFR: 46 mL/min — ABNORMAL LOW (ref >60.00)
Glucose, Bld: 148 mg/dL — ABNORMAL HIGH (ref 70–99)
Potassium: 4 mEq/L (ref 3.5–5.1)
Sodium: 139 mEq/L (ref 135–145)

## 2019-12-04 LAB — LIPID PANEL
Cholesterol: 164 mg/dL (ref 0–200)
HDL: 40.4 mg/dL (ref >39.00)
LDL Cholesterol: 85 mg/dL (ref 0–99)
NonHDL: 123.27
Total CHOL/HDL Ratio: 4
Triglycerides: 189 mg/dL — ABNORMAL HIGH (ref 0.0–149.0)
VLDL: 37.8 mg/dL (ref 0.0–40.0)

## 2019-12-04 LAB — URIC ACID: Uric Acid, Serum: 6.9 mg/dL (ref 4.0–7.8)

## 2019-12-04 LAB — TSH: TSH: 3.61 u[IU]/mL (ref 0.35–4.50)

## 2019-12-04 LAB — VITAMIN D 25 HYDROXY (VIT D DEFICIENCY, FRACTURES): VITD: 23.11 ng/mL — ABNORMAL LOW (ref 30.00–100.00)

## 2019-12-04 MED ORDER — STEGLATRO 5 MG PO TABS: 5.0000 mg | ORAL_TABLET | Freq: Every day | ORAL | 0 refills | Status: DC

## 2019-12-04 MED ORDER — MIRABEGRON ER 50 MG PO TB24: 50.0000 mg | ORAL_TABLET | Freq: Every day | ORAL | 1 refills | Status: DC

## 2019-12-04 MED ORDER — FLUCONAZOLE 150 MG PO TABS: 150.0000 mg | ORAL_TABLET | ORAL | 0 refills | Status: AC

## 2019-12-04 NOTE — Patient Instructions (Signed)

## 2019-12-04 NOTE — Progress Notes (Signed)
Subjective:  Patient ID: Ryan Duke, male    DOB: 1941/04/21  Age: 79 y.o. MRN: [id number]  CC: Annual Exam, Hypertension, Hyperlipidemia, and Diabetes  This visit occurred during the SARS-CoV-2 public health emergency.  Safety protocols were in place, including screening questions prior to the visit, additional usage of staff PPE, and extensive cleaning of exam room while observing appropriate contact time as indicated for disinfecting solutions.    HPI Ryan Duke presents for a CPX.  He complains that his blood pressure is not adequately well controlled.  When he is active he does not experience palpitations, chest pain, shortness of breath, near syncope, dizziness, or lightheadedness.    He complains of urinary urgency and frequency.  He tells me that his urine stream is strong.  He complains about his toenails being long, thick, and uncomfortable.  Outpatient Medications Prior to Visit  Medication Sig Dispense Refill   acetaminophen (TYLENOL) 500 MG tablet Take 1,000 mg by mouth 2 (two) times daily as needed for moderate pain.     atenolol-chlorthalidone (TENORETIC) 100-25 MG tablet TAKE 1 TABLET ONCE DAILY. 90 tablet 0   DULoxetine (CYMBALTA) 60 MG capsule TAKE (1) CAPSULE DAILY. 90 capsule 0   LORazepam (ATIVAN) 0.5 MG tablet Take 1 tablet (0.5 mg total) by mouth 2 (two) times daily as needed (Dizziness). 5 tablet 0   losartan (COZAAR) 100 MG tablet TAKE 1 TABLET DAILY FOR HYPERTENSION. 90 tablet 0   meclizine (ANTIVERT) 12.5 MG tablet Take 2 tablets (25 mg total) by mouth 3 (three) times daily as needed for dizziness. 45 tablet 1   Multiple Vitamin (MULTIVITAMIN WITH MINERALS) TABS tablet Take 1 tablet by mouth 2 (two) times a week.      pravastatin (PRAVACHOL) 40 MG tablet TAKE 1 TABLET ONCE DAILY. 90 tablet 0   sitaGLIPtin (JANUVIA) 100 MG tablet Take 1 tablet (100 mg total) by mouth daily. 90 tablet 1   tamsulosin (FLOMAX) 0.4 MG CAPS capsule TAKE (1)  CAPSULE DAILY. 90 capsule 0   gabapentin (NEURONTIN) 300 MG capsule Take 1 capsule by mouth 3 (three) times daily.     HYDROcodone-acetaminophen (NORCO/VICODIN) 5-325 MG tablet Take 1 tablet by mouth as needed.     ibuprofen (ADVIL,MOTRIN) 200 MG tablet Take 400 mg by mouth every 6 (six) hours as needed for moderate pain.     MYRBETRIQ 25 MG TB24 tablet TAKE 1 TABLET ONCE DAILY. 90 tablet 1   No facility-administered medications prior to visit.    ROS Review of Systems  Constitutional: Positive for unexpected weight change (wt gain).  Respiratory: Negative for cough, chest tightness, shortness of breath and wheezing.   Cardiovascular: Negative for chest pain, palpitations and leg swelling.  Gastrointestinal: Negative for abdominal pain, constipation, diarrhea, nausea and vomiting.  Endocrine: Negative for polydipsia, polyphagia and polyuria.  Genitourinary: Positive for frequency and urgency. Negative for decreased urine volume, difficulty urinating, dysuria, flank pain and scrotal swelling.  Musculoskeletal: Positive for arthralgias. Negative for myalgias.  Skin: Negative for color change and rash.  Neurological: Negative.  Negative for dizziness, weakness, light-headedness, numbness and headaches.  Hematological: Negative for adenopathy. Does not bruise/bleed easily.  Psychiatric/Behavioral: Negative.     Objective:  BP (!) 158/76 (BP Location: Left Arm, Patient Position: Sitting, Cuff Size: Large)    Pulse (!) 56    Temp 97.7 F (36.5 C) (Oral)    Resp 16    Ht 5\' 11"  (1.803 m)    Wt 241  lb (109.3 kg)    SpO2 95%    BMI 33.61 kg/m   BP Readings from Last 3 Encounters:  12/04/19 (!) 158/76  09/28/19 126/78  06/01/19 126/74    Wt Readings from Last 3 Encounters:  12/04/19 241 lb (109.3 kg)  09/28/19 226 lb (102.5 kg)  06/01/19 212 lb (96.2 kg)    Physical Exam Vitals reviewed.  Constitutional:      General: He is not in acute distress.    Appearance: He is obese. He  is not toxic-appearing or diaphoretic.  HENT:     Nose: Nose normal.     Mouth/Throat:     Mouth: Mucous membranes are moist.  Eyes:     General: No scleral icterus.    Conjunctiva/sclera: Conjunctivae normal.  Cardiovascular:     Rate and Rhythm: Normal rate and regular rhythm.     Heart sounds: Murmur present. Systolic murmur present with a grade of 2/6. No diastolic murmur. No gallop.   Pulmonary:     Effort: Pulmonary effort is normal.     Breath sounds: No stridor. No wheezing, rhonchi or rales.  Abdominal:     General: Abdomen is protuberant. Bowel sounds are normal. There is no distension.     Palpations: Abdomen is soft. There is no hepatomegaly, splenomegaly or mass.     Tenderness: There is no abdominal tenderness. There is no guarding.     Hernia: No hernia is present.  Musculoskeletal:        General: Normal range of motion.     Cervical back: Neck supple.     Right lower leg: No edema.     Left lower leg: No edema.  Feet:     Right foot:     Skin integrity: Skin integrity normal.     Toenail Condition: Right toenails are abnormally thick and long. Fungal disease present.    Left foot:     Skin integrity: Skin integrity normal.     Toenail Condition: Left toenails are abnormally thick and long. Fungal disease present. Lymphadenopathy:     Cervical: No cervical adenopathy.  Skin:    General: Skin is warm and dry.  Neurological:     General: No focal deficit present.     Mental Status: He is alert and oriented to person, place, and time. Mental status is at baseline.  Psychiatric:        Mood and Affect: Mood normal.        Behavior: Behavior normal.     Lab Results  Component Value Date   WBC 8.0 12/04/2019   HGB 14.3 12/04/2019   HCT 42.8 12/04/2019   PLT 234.0 12/04/2019   GLUCOSE 148 (H) 12/04/2019   CHOL 164 12/04/2019   TRIG 189.0 (H) 12/04/2019   HDL 40.40 12/04/2019   LDLDIRECT 81.0 05/26/2018   LDLCALC 85 12/04/2019   ALT 10 12/04/2019   AST  13 12/04/2019   NA 139 12/04/2019   K 4.0 12/04/2019   CL 101 12/04/2019   CREATININE 1.47 12/04/2019   BUN 23 12/04/2019   CO2 30 12/04/2019   TSH 3.61 12/04/2019   PSA 1.05 10/28/2015   INR 1.04 09/14/2017   HGBA1C 7.4 (A) 12/04/2019   MICROALBUR 26.5 (H) 12/04/2019    MR LUMBAR SPINE WO CONTRAST  Result Date: 06/29/2019 CLINICAL DATA:  Vertebral compression fractures status post kyphoplasty. Persistent severe back pain with right-sided radiculopathy EXAM: MRI LUMBAR SPINE WITHOUT CONTRAST TECHNIQUE: Multiplanar, multisequence MR imaging of the lumbar  spine was performed. No intravenous contrast was administered. COMPARISON:  MRI 04/19/2019 FINDINGS: Segmentation:  Standard. Alignment:  Unchanged. 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 Interval progression in the degree of vertebral body height loss of a subacute L4 vertebral body compression fracture, now with greater than 50% height loss. Mild bony retropulsion the superior aspect of the L4 vertebral body. There is persistent marrow edema within the vertebral body and extending into the pedicles bilaterally. Low T1/T2 signal centrally within the vertebral body suggesting cement augmentation. No new fracture. Discogenic marrow changes throughout the remaining lumbar levels. Conus medullaris and cauda equina: Conus extends to the T12-L1 level. Conus and cauda equina appear normal. Paraspinal and other soft tissues: The previously seen bilateral psoas muscle fluid collections have resolved. Remaining paravertebral soft tissues are within normal limits. Disc levels: T12-L1: Mild diffuse disc bulge with left greater than right facet arthrosis. No foraminal or canal stenosis. Unchanged. L1-L2: Mild diffuse disc bulge with bilateral facet arthrosis results in mild canal stenosis without foraminal narrowing. Unchanged. L2-L3: Bony retropulsion from chronic  L2 compression fracture which results in moderate canal stenosis and moderate to severe stenosis of the lateral recesses and neural foramen bilaterally. Unchanged. L3-L4: Mild bony retropulsion of the L4 vertebral body, progressed from prior. Mild diffuse disc bulge and bilateral facet arthrosis resulting in moderate canal stenosis with moderate stenosis of the lateral recesses and neural foramen bilaterally. The degree of canal stenosis has slightly progressed secondary to the L4 vertebral retropulsion. L4-L5: No significant disc protrusion. Severe bilateral facet arthrosis results in severe narrowing of the bilateral neural foramen and moderate narrowing of the canal and bilateral subarticular recesses. Unchanged. L5-S1: Bilateral facet arthropathy without foraminal or canal stenosis. Unchanged. IMPRESSION: 1. Interval progression of L4 vertebral body compression fracture, now with greater than 50% vertebral body height loss. There is also slight increased bony retropulsion this level resulting in moderate canal stenosis, slightly progressed from prior. 2. Low signal changes within the L4 vertebral body, favored to represent interval cement augmentation. Correlate with interval procedural history. 3. Interval resolution of previously seen bilateral psoas muscle fluid collections, likely resolved intramuscular hematomas. 4. 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 5. Multilevel lumbar spondylosis at the remaining vertebral levels including moderate canal stenosis at L2-3 and L4-5, not significantly progressed compared to the prior MRI 04/18/2019. Electronically Signed   By: Davina Poke M.D.   On: 06/29/2019 10:09    Assessment & Plan:   Ryan Duke was seen today for annual exam, hypertension, hyperlipidemia and diabetes.  Diagnoses and all orders for this visit:  Essential hypertension- His blood pressure remains a little too high.  He will start taking an SGLT2  inhibitor and I anticipate this will help lower his blood pressure. -     CBC with Differential -     Basic metabolic panel -     Urinalysis, Routine w reflex microscopic -     TSH  Type II diabetes mellitus with manifestations ([place])- His sugars are not quite adequately well controlled.  I recommended that he start taking an SGLT2 inhibitor. -     Basic metabolic panel -     Cancel: Hemoglobin A1c -     Microalbumin / creatinine urine ratio -     HM Diabetes Foot Exam -     POCT HgB A1C -     Ertugliflozin L-PyroglutamicAc (STEGLATRO) 5 MG TABS; Take  5 mg by mouth daily before breakfast.  Stage 3a chronic kidney disease- His renal function is stable.  He will avoid nephrotoxic agents. -     CBC with Differential -     Basic metabolic panel  Hyperlipidemia with target LDL less than 100- He has achieved his LDL goal is doing well on the statin. -     Lipid panel -     Hepatic function panel -     TSH  Routine general medical examination at a health care facility- Exam completed, labs reviewed, vaccines reviewed, no cancer screenings are indicated, patient education was given.  Vitamin D deficiency -     Vitamin D 25 hydroxy -     Cholecalciferol 50 MCG (2000 UT) TABS; Take 1 tablet (2,000 Units total) by mouth daily.  Idiopathic gout, unspecified chronicity, unspecified site- He has achieved his uric acid goal and is having no recurrent episodes of gouty arthropathy. -     Uric acid  Onychomycosis of toenail -     fluconazole (DIFLUCAN) 150 MG tablet; Take 1 tablet (150 mg total) by mouth once a week.  OAB (overactive bladder)- Will try a higher dose of Myrbetriq to see if this will help with his symptoms. -     mirabegron ER (MYRBETRIQ) 50 MG TB24 tablet; Take 1 tablet (50 mg total) by mouth daily.   I have discontinued Ryan Duke. Ryan Duke's ibuprofen, gabapentin, HYDROcodone-acetaminophen, and Myrbetriq. I am also having him start on fluconazole, Steglatro, mirabegron ER, and  Cholecalciferol. Additionally, I am having him maintain his multivitamin with minerals, acetaminophen, LORazepam, sitaGLIPtin, meclizine, DULoxetine, tamsulosin, atenolol-chlorthalidone, pravastatin, and losartan.  Meds ordered this encounter  Medications   fluconazole (DIFLUCAN) 150 MG tablet    Sig: Take 1 tablet (150 mg total) by mouth once a week.    Dispense:  12 tablet    Refill:  0   Ertugliflozin L-PyroglutamicAc (STEGLATRO) 5 MG TABS    Sig: Take 5 mg by mouth daily before breakfast.    Dispense:  70 tablet    Refill:  0   mirabegron ER (MYRBETRIQ) 50 MG TB24 tablet    Sig: Take 1 tablet (50 mg total) by mouth daily.    Dispense:  90 tablet    Refill:  1   Cholecalciferol 50 MCG (2000 UT) TABS    Sig: Take 1 tablet (2,000 Units total) by mouth daily.    Dispense:  90 tablet    Refill:  1     Follow-up: Return in about 6 months (around 06/02/2020).  [name], MD

## 2019-12-05 ENCOUNTER — Encounter: Payer: Self-pay | Admitting: Internal Medicine

## 2019-12-05 DIAGNOSIS — M48062 Spinal stenosis, lumbar region with neurogenic claudication: Secondary | ICD-10-CM | POA: Diagnosis not present

## 2019-12-05 DIAGNOSIS — R2681 Unsteadiness on feet: Secondary | ICD-10-CM | POA: Diagnosis not present

## 2019-12-05 DIAGNOSIS — M545 Low back pain: Secondary | ICD-10-CM | POA: Diagnosis not present

## 2019-12-05 LAB — MICROALBUMIN / CREATININE URINE RATIO
Creatinine,U: 101.6 mg/dL
Microalb Creat Ratio: 26.1 mg/g (ref 0.0–30.0)
Microalb, Ur: 26.5 mg/dL — ABNORMAL HIGH (ref 0.0–1.9)

## 2019-12-05 MED ORDER — CHOLECALCIFEROL 50 MCG (2000 UT) PO TABS: 1.0000 | ORAL_TABLET | Freq: Every day | ORAL | 1 refills | Status: DC

## 2019-12-07 DIAGNOSIS — M48062 Spinal stenosis, lumbar region with neurogenic claudication: Secondary | ICD-10-CM | POA: Diagnosis not present

## 2019-12-07 DIAGNOSIS — M545 Low back pain: Secondary | ICD-10-CM | POA: Diagnosis not present

## 2019-12-07 DIAGNOSIS — R2681 Unsteadiness on feet: Secondary | ICD-10-CM | POA: Diagnosis not present

## 2019-12-12 DIAGNOSIS — R2681 Unsteadiness on feet: Secondary | ICD-10-CM | POA: Diagnosis not present

## 2019-12-12 DIAGNOSIS — M545 Low back pain: Secondary | ICD-10-CM | POA: Diagnosis not present

## 2019-12-12 DIAGNOSIS — M48062 Spinal stenosis, lumbar region with neurogenic claudication: Secondary | ICD-10-CM | POA: Diagnosis not present

## 2019-12-14 DIAGNOSIS — R2681 Unsteadiness on feet: Secondary | ICD-10-CM | POA: Diagnosis not present

## 2019-12-14 DIAGNOSIS — M48062 Spinal stenosis, lumbar region with neurogenic claudication: Secondary | ICD-10-CM | POA: Diagnosis not present

## 2019-12-14 DIAGNOSIS — M545 Low back pain: Secondary | ICD-10-CM | POA: Diagnosis not present

## 2019-12-19 DIAGNOSIS — M48062 Spinal stenosis, lumbar region with neurogenic claudication: Secondary | ICD-10-CM | POA: Diagnosis not present

## 2019-12-19 DIAGNOSIS — M545 Low back pain: Secondary | ICD-10-CM | POA: Diagnosis not present

## 2019-12-19 DIAGNOSIS — R2681 Unsteadiness on feet: Secondary | ICD-10-CM | POA: Diagnosis not present

## 2019-12-20 ENCOUNTER — Ambulatory Visit: Payer: Medicare Other | Attending: Internal Medicine

## 2019-12-20 DIAGNOSIS — Z23 Encounter for immunization: Secondary | ICD-10-CM

## 2019-12-20 NOTE — Progress Notes (Signed)
   Covid-19 Vaccination Clinic  Name:  Ryan Duke    MRN: [id number] DOB: [birth year]  12/20/2019  [name] was observed post Covid-19 immunization for 15 minutes without incidence. He was provided with Vaccine Information Sheet and instruction to access the V-Safe system.   [name] was instructed to call 911 with any severe reactions post vaccine: Marland Kitchen Difficulty breathing  . Swelling of your face and throat  . A fast heartbeat  . A bad rash all over your body  . Dizziness and weakness    Immunizations Administered    Name Date Dose VIS Date Route   Pfizer COVID-19 Vaccine 12/20/2019  3:06 PM 0.3 mL 11/10/2019 Intramuscular   Manufacturer: [place]   Lot: [id number]   [place]: [id number]

## 2019-12-21 DIAGNOSIS — R2681 Unsteadiness on feet: Secondary | ICD-10-CM | POA: Diagnosis not present

## 2019-12-21 DIAGNOSIS — M48062 Spinal stenosis, lumbar region with neurogenic claudication: Secondary | ICD-10-CM | POA: Diagnosis not present

## 2019-12-21 DIAGNOSIS — M545 Low back pain: Secondary | ICD-10-CM | POA: Diagnosis not present

## 2019-12-26 DIAGNOSIS — M48062 Spinal stenosis, lumbar region with neurogenic claudication: Secondary | ICD-10-CM | POA: Diagnosis not present

## 2019-12-26 DIAGNOSIS — M545 Low back pain: Secondary | ICD-10-CM | POA: Diagnosis not present

## 2019-12-26 DIAGNOSIS — R2681 Unsteadiness on feet: Secondary | ICD-10-CM | POA: Diagnosis not present

## 2019-12-28 DIAGNOSIS — M545 Low back pain: Secondary | ICD-10-CM | POA: Diagnosis not present

## 2019-12-28 DIAGNOSIS — R2681 Unsteadiness on feet: Secondary | ICD-10-CM | POA: Diagnosis not present

## 2019-12-28 DIAGNOSIS — M48062 Spinal stenosis, lumbar region with neurogenic claudication: Secondary | ICD-10-CM | POA: Diagnosis not present

## 2020-01-02 DIAGNOSIS — R2681 Unsteadiness on feet: Secondary | ICD-10-CM | POA: Diagnosis not present

## 2020-01-02 DIAGNOSIS — M545 Low back pain: Secondary | ICD-10-CM | POA: Diagnosis not present

## 2020-01-02 DIAGNOSIS — M48062 Spinal stenosis, lumbar region with neurogenic claudication: Secondary | ICD-10-CM | POA: Diagnosis not present

## 2020-01-04 DIAGNOSIS — R2681 Unsteadiness on feet: Secondary | ICD-10-CM | POA: Diagnosis not present

## 2020-01-04 DIAGNOSIS — M48062 Spinal stenosis, lumbar region with neurogenic claudication: Secondary | ICD-10-CM | POA: Diagnosis not present

## 2020-01-04 DIAGNOSIS — M545 Low back pain: Secondary | ICD-10-CM | POA: Diagnosis not present

## 2020-01-09 DIAGNOSIS — R2681 Unsteadiness on feet: Secondary | ICD-10-CM | POA: Diagnosis not present

## 2020-01-09 DIAGNOSIS — M48062 Spinal stenosis, lumbar region with neurogenic claudication: Secondary | ICD-10-CM | POA: Diagnosis not present

## 2020-01-09 DIAGNOSIS — M545 Low back pain: Secondary | ICD-10-CM | POA: Diagnosis not present

## 2020-01-10 ENCOUNTER — Ambulatory Visit: Payer: Medicare Other | Attending: Internal Medicine

## 2020-01-10 DIAGNOSIS — Z23 Encounter for immunization: Secondary | ICD-10-CM | POA: Insufficient documentation

## 2020-01-10 NOTE — Progress Notes (Signed)
   Covid-19 Vaccination Clinic  Name:  Ryan Duke    MRN: [id number] DOB: 24-Jun-1941  01/10/2020  Mr. Severino was observed post Covid-19 immunization for 15 minutes without incidence. He was provided with Vaccine Information Sheet and instruction to access the V-Safe system.   Mr. Punzalan was instructed to call 911 with any severe reactions post vaccine: Marland Kitchen Difficulty breathing  . Swelling of your face and throat  . A fast heartbeat  . A bad rash all over your body  . Dizziness and weakness    Immunizations Administered    Name Date Dose VIS Date Route   Pfizer COVID-19 Vaccine 01/10/2020 11:51 AM 0.3 mL 11/10/2019 Intramuscular   Manufacturer: [place]   Lot: [id number]   [place]: [id number]

## 2020-01-11 DIAGNOSIS — M48062 Spinal stenosis, lumbar region with neurogenic claudication: Secondary | ICD-10-CM | POA: Diagnosis not present

## 2020-01-11 DIAGNOSIS — M545 Low back pain: Secondary | ICD-10-CM | POA: Diagnosis not present

## 2020-01-11 DIAGNOSIS — R2681 Unsteadiness on feet: Secondary | ICD-10-CM | POA: Diagnosis not present

## 2020-01-16 DIAGNOSIS — R2681 Unsteadiness on feet: Secondary | ICD-10-CM | POA: Diagnosis not present

## 2020-01-16 DIAGNOSIS — M48062 Spinal stenosis, lumbar region with neurogenic claudication: Secondary | ICD-10-CM | POA: Diagnosis not present

## 2020-01-16 DIAGNOSIS — M545 Low back pain: Secondary | ICD-10-CM | POA: Diagnosis not present

## 2020-01-23 ENCOUNTER — Ambulatory Visit: Payer: Medicare Other

## 2020-01-23 DIAGNOSIS — M48062 Spinal stenosis, lumbar region with neurogenic claudication: Secondary | ICD-10-CM | POA: Diagnosis not present

## 2020-01-23 DIAGNOSIS — M545 Low back pain: Secondary | ICD-10-CM | POA: Diagnosis not present

## 2020-01-23 DIAGNOSIS — R2681 Unsteadiness on feet: Secondary | ICD-10-CM | POA: Diagnosis not present

## 2020-01-25 DIAGNOSIS — M48062 Spinal stenosis, lumbar region with neurogenic claudication: Secondary | ICD-10-CM | POA: Diagnosis not present

## 2020-01-25 DIAGNOSIS — R2681 Unsteadiness on feet: Secondary | ICD-10-CM | POA: Diagnosis not present

## 2020-01-25 DIAGNOSIS — M545 Low back pain: Secondary | ICD-10-CM | POA: Diagnosis not present

## 2020-01-30 DIAGNOSIS — M545 Low back pain: Secondary | ICD-10-CM | POA: Diagnosis not present

## 2020-01-30 DIAGNOSIS — M48062 Spinal stenosis, lumbar region with neurogenic claudication: Secondary | ICD-10-CM | POA: Diagnosis not present

## 2020-01-30 DIAGNOSIS — R2681 Unsteadiness on feet: Secondary | ICD-10-CM | POA: Diagnosis not present

## 2020-02-01 DIAGNOSIS — M48062 Spinal stenosis, lumbar region with neurogenic claudication: Secondary | ICD-10-CM | POA: Diagnosis not present

## 2020-02-01 DIAGNOSIS — M545 Low back pain: Secondary | ICD-10-CM | POA: Diagnosis not present

## 2020-02-01 DIAGNOSIS — R2681 Unsteadiness on feet: Secondary | ICD-10-CM | POA: Diagnosis not present

## 2020-02-06 DIAGNOSIS — R2681 Unsteadiness on feet: Secondary | ICD-10-CM | POA: Diagnosis not present

## 2020-02-06 DIAGNOSIS — M48062 Spinal stenosis, lumbar region with neurogenic claudication: Secondary | ICD-10-CM | POA: Diagnosis not present

## 2020-02-06 DIAGNOSIS — M545 Low back pain: Secondary | ICD-10-CM | POA: Diagnosis not present

## 2020-02-08 ENCOUNTER — Telehealth: Payer: Self-pay | Admitting: Cardiovascular Disease

## 2020-02-08 DIAGNOSIS — R2681 Unsteadiness on feet: Secondary | ICD-10-CM | POA: Diagnosis not present

## 2020-02-08 DIAGNOSIS — M545 Low back pain: Secondary | ICD-10-CM | POA: Diagnosis not present

## 2020-02-08 DIAGNOSIS — M48062 Spinal stenosis, lumbar region with neurogenic claudication: Secondary | ICD-10-CM | POA: Diagnosis not present

## 2020-02-08 DIAGNOSIS — I350 Nonrheumatic aortic (valve) stenosis: Secondary | ICD-10-CM

## 2020-02-08 NOTE — Telephone Encounter (Signed)
Scheduled patient for echo 3/15.  Scheduled patient for one year visit 5/14. He understands he will have EKG at that time. He was grateful for assistance.

## 2020-02-08 NOTE — Telephone Encounter (Signed)
Patient states that he was scheduled for echo in June 2020 but had to cancel due to back surgery. He is ready to get one scheduled now as well as an EKG.

## 2020-02-09 DIAGNOSIS — M4726 Other spondylosis with radiculopathy, lumbar region: Secondary | ICD-10-CM | POA: Diagnosis not present

## 2020-02-09 DIAGNOSIS — M8008XD Age-related osteoporosis with current pathological fracture, vertebra(e), subsequent encounter for fracture with routine healing: Secondary | ICD-10-CM | POA: Diagnosis not present

## 2020-02-09 DIAGNOSIS — M48062 Spinal stenosis, lumbar region with neurogenic claudication: Secondary | ICD-10-CM | POA: Diagnosis not present

## 2020-02-12 ENCOUNTER — Other Ambulatory Visit: Payer: Self-pay

## 2020-02-12 ENCOUNTER — Ambulatory Visit (HOSPITAL_COMMUNITY): Payer: Medicare Other | Attending: Internal Medicine

## 2020-02-12 DIAGNOSIS — I350 Nonrheumatic aortic (valve) stenosis: Secondary | ICD-10-CM

## 2020-02-15 ENCOUNTER — Other Ambulatory Visit: Payer: Self-pay | Admitting: Internal Medicine

## 2020-02-15 DIAGNOSIS — I10 Essential (primary) hypertension: Secondary | ICD-10-CM | POA: Diagnosis not present

## 2020-02-15 DIAGNOSIS — Z6832 Body mass index (BMI) 32.0-32.9, adult: Secondary | ICD-10-CM | POA: Diagnosis not present

## 2020-02-15 DIAGNOSIS — M48062 Spinal stenosis, lumbar region with neurogenic claudication: Secondary | ICD-10-CM | POA: Diagnosis not present

## 2020-02-15 DIAGNOSIS — M4726 Other spondylosis with radiculopathy, lumbar region: Secondary | ICD-10-CM | POA: Diagnosis not present

## 2020-02-15 DIAGNOSIS — E118 Type 2 diabetes mellitus with unspecified complications: Secondary | ICD-10-CM

## 2020-02-19 LAB — HM DIABETES EYE EXAM

## 2020-02-20 ENCOUNTER — Ambulatory Visit: Payer: Medicare Other

## 2020-02-21 DIAGNOSIS — M48062 Spinal stenosis, lumbar region with neurogenic claudication: Secondary | ICD-10-CM | POA: Diagnosis not present

## 2020-02-28 ENCOUNTER — Ambulatory Visit (INDEPENDENT_AMBULATORY_CARE_PROVIDER_SITE_OTHER): Payer: Medicare Other

## 2020-02-28 ENCOUNTER — Other Ambulatory Visit: Payer: Self-pay

## 2020-02-28 VITALS — BP 122/80 | HR 59 | Temp 98.3°F | Resp 16 | Ht 71.0 in | Wt 241.0 lb

## 2020-02-28 DIAGNOSIS — Z0000 Encounter for general adult medical examination without abnormal findings: Secondary | ICD-10-CM

## 2020-02-28 NOTE — Progress Notes (Addendum)
Subjective:   Ryan Duke is a 79 y.o. male who presents for Medicare Annual/Subsequent preventive examination.  Review of Systems:  Medicare Wellness Visit Cardiac Risk Factors include: advanced age (>62men, >1 women);male gender;diabetes mellitus;dyslipidemia;hypertension;obesity (BMI >30kg/m2)  Sleep Patterns: no issues falling asleep; feels well rested on waking; sleeps 7-8 hours nightly.  Home Safety: Feels safe in home.  Smoke alarms in place. Living environment: Lives with wife of 58 years; uses a cane for assistance; has a good support system. Seat Belt/Bike Helmet: wears set belt.    Objective:    Vitals: BP 122/80 (BP Location: Left Arm, Patient Position: Sitting, Cuff Size: Normal)   Pulse (!) 59   Temp 98.3 F (36.8 C)   Resp 16   Ht 5\' 11"  (1.803 m)   Wt 241 lb (109.3 kg)   SpO2 94%   BMI 33.61 kg/m   Body mass index is 33.61 kg/m.  Advanced Directives 02/28/2020 01/27/2019 01/24/2019 01/19/2019 09/10/2017 09/05/2017 09/05/2017  Does Patient Have a Medical Advance Directive? Yes Yes Yes Yes Yes Yes Yes  Type of Advance Directive Living will;Healthcare Power of [place];Living will [place];Living will [place];Living will Healthcare Power of [place]  Does patient want to make changes to medical advance directive? No - Patient declined - - - - No - Patient declined -  Copy of [place] in Chart? No - copy requested - - No - copy requested No - copy requested No - copy requested -    Tobacco Social History   Tobacco Use  Smoking Status Former Smoker  . Quit date: 11/30/1974  . Years since quitting: 45.2  Smokeless Tobacco Never Used     Counseling given: No  Clinical Intake:  Pre-visit preparation completed: Yes  Pain : No/denies pain Pain Score: 0-No pain   Diabetes: Yes CBG done?: No Did pt. bring in CBG  monitor from home?: No  How often do you need to have someone help you when you read instructions, pamphlets, or other written materials from your doctor or pharmacy?: 1 - Never What is the last grade level you completed in school?: 3 years of college  Interpreter Needed?: No  Information entered by :: Jathniel Smeltzer N. [place] Guitar, LPN  Past Medical History:  Diagnosis Date  . Diabetes mellitus    type 2  . Hyperlipidemia   . Hypertension   . Hypogonadism, male   . Pancreatitis 2008  . Renal insufficiency    Past Surgical History:  Procedure Laterality Date  . EYE SURGERY     cataracts  . FLEXIBLE SIGMOIDOSCOPY N/A 09/10/2017   Procedure: FLEXIBLE SIGMOIDOSCOPY;  Surgeon: Mauri Pole, MD;  Location: [facility];  Service: Endoscopy;  Laterality: N/A;  . IR KYPHO LUMBAR INC FX REDUCE BONE BX UNI/BIL CANNULATION INC/IMAGING  09/14/2017  . IR RADIOLOGIST EVAL & MGMT  10/07/2017   Family History  Problem Relation Age of Onset  . Hypertension Other    Social History   Socioeconomic History  . Marital status: Married    Spouse name: Not on file  . Number of children: Not on file  . Years of education: Not on file  . Highest education level: Not on file  Occupational History  . Occupation: retired  Tobacco Use  . Smoking status: Former Smoker    Quit date: 11/30/1974    Years since quitting: 45.2  . Smokeless  tobacco: Never Used  Substance and Sexual Activity  . Alcohol use: Not Currently    Alcohol/week: 0.0 standard drinks  . Drug use: No  . Sexual activity: Yes  Other Topics Concern  . Not on file  Social History Narrative   Regular exercise-yes   Social Determinants of Health   Financial Resource Strain:   . Difficulty of Paying Living Expenses:   Food Insecurity:   . Worried About Charity fundraiser in the Last Year:   . Arboriculturist in the Last Year:   Transportation Needs:   . Film/video editor (Medical):   Marland Kitchen Lack of Transportation  (Non-Medical):   Physical Activity:   . Days of Exercise per Week:   . Minutes of Exercise per Session:   Stress:   . Feeling of Stress :   Social Connections:   . Frequency of Communication with Friends and Family:   . Frequency of Social Gatherings with Friends and Family:   . Attends Religious Services:   . Active Member of Clubs or Organizations:   . Attends Archivist Meetings:   Marland Kitchen Marital Status:     Outpatient Encounter Medications as of 02/28/2020  Medication Sig  . acetaminophen (TYLENOL) 500 MG tablet Take 1,000 mg by mouth 2 (two) times daily as needed for moderate pain.  Marland Kitchen atenolol-chlorthalidone (TENORETIC) 100-25 MG tablet TAKE 1 TABLET ONCE DAILY.  Marland Kitchen Cholecalciferol 50 MCG (2000 UT) TABS Take 1 tablet (2,000 Units total) by mouth daily.  . DULoxetine (CYMBALTA) 60 MG capsule TAKE (1) CAPSULE DAILY.  Marland Kitchen Ertugliflozin L-PyroglutamicAc (STEGLATRO) 5 MG TABS Take 5 mg by mouth daily before breakfast.  . fluconazole (DIFLUCAN) 150 MG tablet Take 1 tablet (150 mg total) by mouth once a week.  Marland Kitchen JANUVIA 100 MG tablet TAKE 1 TABLET ONCE DAILY.  Marland Kitchen LORazepam (ATIVAN) 0.5 MG tablet Take 1 tablet (0.5 mg total) by mouth 2 (two) times daily as needed (Dizziness).  Marland Kitchen losartan (COZAAR) 100 MG tablet TAKE 1 TABLET DAILY FOR HYPERTENSION.  . meclizine (ANTIVERT) 12.5 MG tablet Take 2 tablets (25 mg total) by mouth 3 (three) times daily as needed for dizziness.  . mirabegron ER (MYRBETRIQ) 50 MG TB24 tablet Take 1 tablet (50 mg total) by mouth daily.  . Multiple Vitamin (MULTIVITAMIN WITH MINERALS) TABS tablet Take 1 tablet by mouth 2 (two) times a week.   . pravastatin (PRAVACHOL) 40 MG tablet TAKE 1 TABLET ONCE DAILY.  . tamsulosin (FLOMAX) 0.4 MG CAPS capsule TAKE (1) CAPSULE DAILY.   No facility-administered encounter medications on file as of 02/28/2020.    Activities of Daily Living In your present state of health, do you have any difficulty performing the following  activities: 02/28/2020  Hearing? N  Vision? N  Difficulty concentrating or making decisions? Y  Walking or climbing stairs? N  Dressing or bathing? N  Doing errands, shopping? N  Preparing Food and eating ? N  Using the Toilet? N  In the past six months, have you accidently leaked urine? Y  Comment wears Depends  Do you have problems with loss of bowel control? Y  Comment wears Depends  Managing your Medications? N  Managing your Finances? N  Housekeeping or managing your Housekeeping? N  Comment Has someone to do yardwork  Some recent data might be hidden    Patient Care Team: Janith Lima, MD as PCP - General Marilynne Halsted, MD as Referring Physician (Ophthalmology)   Assessment:  This is a routine wellness examination for Ryan Duke.  Exercise Activities and Dietary recommendations Current Exercise Habits: The patient does not participate in regular exercise at present  Goals    . Client understands the importance of follow-up with providers by attending scheduled visits    . Patient Stated     Continue to enjoy gardening, be active and enjoy spending time outdoors.     . Patient Stated     Would like to exercise more and get rid of the cane    . Patient Stated     Will continue to do brain exercises by reading, doing puzzles, word searches, etc.       Fall Risk Fall Risk  02/28/2020 01/19/2019 11/17/2018 05/26/2018 11/15/2016  Falls in the past year? 1 0 0 Yes No  Number falls in past yr: 0 - 0 1 -  Injury with Fall? 0 - 0 Yes -  Risk for fall due to : Impaired balance/gait - - - -  Follow up Falls evaluation completed;Education provided;Falls prevention discussed - Falls evaluation completed - -   Is the patient's home free of loose throw rugs in walkways, pet beds, electrical cords, etc?   yes      Grab bars in the bathroom? yes      Handrails on the stairs?   yes      Adequate lighting?   yes  Depression Screen PHQ 2/9 Scores 02/28/2020 12/04/2019  09/28/2019 01/19/2019  PHQ - 2 Score 0 3 0 0  PHQ- 9 Score - 9 1 0   Cognitive Function     6CIT Screen 02/28/2020  What Year? 0 points  What month? 0 points  What time? 0 points  Count back from 20 0 points  Months in reverse 0 points  Repeat phrase 0 points  Total Score 0    Immunization History  Administered Date(s) Administered  . Fluad Quad(high Dose 65+) 09/12/2019  . Influenza Split 09/16/2011, 10/17/2012  . Influenza Whole 10/07/2006, 09/11/2008, 09/02/2009, 09/08/2010  . Influenza, High Dose Seasonal PF 10/18/2013, 10/28/2015, 08/25/2016, 09/06/2017, 09/14/2018  . Influenza,inj,Quad PF,6+ Mos 08/15/2014  . PFIZER SARS-COV-2 Vaccination 12/20/2019, 01/10/2020  . Pneumococcal Conjugate-13 03/06/2014  . Pneumococcal Polysaccharide-23 06/11/2006, 11/12/2016  . Tdap 05/13/2011  . Zoster 05/29/2011  . Zoster Recombinat (Shingrix) 05/31/2018, 10/20/2018    Screening Tests Health Maintenance  Topic Date Due  . OPHTHALMOLOGY EXAM  02/21/2020  . FOOT EXAM  12/03/2020  . TETANUS/TDAP  05/12/2021  . INFLUENZA VACCINE  Completed  . PNA vac Low Risk Adult  Completed   Cancer Screenings: Lung: Low Dose CT Chest recommended if Age 38-80 years, 30 pack-year currently smoking OR have quit w/in 15years. Patient does not qualify. Colorectal: up to date    Plan:   Reviewed health maintenance screenings with patient today and relevant education, vaccines, and/or referrals were provided.   Continue to eat heart healthy diet (full of fruits, vegetables, whole grains, lean protein, water--limit salt, fat, and sugar intake) and increase physical activity as tolerated.  I have personally reviewed and noted the following in the patient's chart:   . Medical and social history . Use of alcohol, tobacco or illicit drugs  . Current medications and supplements . Functional ability and status . Nutritional status . Physical activity . Advanced directives . List of other physicians  . Hospitalizations, surgeries, and ER visits in previous 12 months . Vitals . Screenings to include cognitive, depression, and falls . Referrals and appointments  In  addition, I have reviewed and discussed with patient certain preventive protocols, quality metrics, and best practice recommendations. A written personalized care plan for preventive services as well as general preventive health recommendations were provided to patient.     Sheral Flow, LPN  [id number] Nurse Health Advisor  Medical screening examination/treatment/procedure(s) were performed by non-physician practitioner and as supervising physician I was immediately available for consultation/collaboration. I agree with above. [name], MD

## 2020-02-28 NOTE — Patient Instructions (Addendum)
[name] , Thank you for taking time to come for your Medicare Wellness Visit. I appreciate your ongoing commitment to your health goals. Please review the following plan we discussed and let me know if I can assist you in the future.   Screening recommendations/referrals: Colorectal Screening: up to date; last done 09/10/2017  Vision and Dental Exams: Recommended annual ophthalmology exams for early detection of glaucoma and other disorders of the eye Recommended annual dental exams for proper oral hygiene  Diabetic Exams: Diabetic Eye Exam: up to date; last done 02/21/2019 Diabetic Foot Exam: up to date; last done 12/04/2019  Vaccinations: Influenza vaccine: up to date; last done 09/12/2019  Pneumococcal vaccine: up to date; last done 03/06/2014 and 11/12/2016  Tdap vaccine: up to date; last done 05/13/2011  Shingles vaccine:up to date; last done 05/31/2018 and 10/20/2018  Covid vaccine: up to date; last done 12/20/2019 and 01/10/2020 Therapist, music)  Advanced directives: Advance directives discussed with you today. Patient has a copy of his Living Will and Power of Attorney at home.  Goals:  Recommend to drink at least 6-8 8oz glasses of water per day.  Recommend to exercise for at least 150 minutes per week.  Recommend to remove any items from the home that may cause slips or trips.  Recommend to decrease portion sizes by eating 3 small healthy meals and at least 2 healthy snacks per day.  Recommend to begin DASH diet as directed below  Next appointment: Please schedule your Annual Wellness Visit with your Nurse Health Advisor in one year.  Preventive Care 80 Years and Older, Male Preventive care refers to lifestyle choices and visits with your health care provider that can promote health and wellness. What does preventive care include?  A yearly physical exam. This is also called an annual well check.  Dental exams once or twice a year.  Routine eye exams. Ask your health care  provider how often you should have your eyes checked.  Personal lifestyle choices, including:  Daily care of your teeth and gums.  Regular physical activity.  Eating a healthy diet.  Avoiding tobacco and drug use.  Limiting alcohol use.  Practicing safe sex.  Taking low doses of aspirin every day if recommended by your health care provider..  Taking vitamin and mineral supplements as recommended by your health care provider. What happens during an annual well check? The services and screenings done by your health care provider during your annual well check will depend on your age, overall health, lifestyle risk factors, and family history of disease. Counseling  Your health care provider may ask you questions about your:  Alcohol use.  Tobacco use.  Drug use.  Emotional well-being.  Home and relationship well-being.  Sexual activity.  Eating habits.  History of falls.  Memory and ability to understand (cognition).  Work and work Statistician. Screening  You may have the following tests or measurements:  Height, weight, and BMI.  Blood pressure.  Lipid and cholesterol levels. These may be checked every 5 years, or more frequently if you are over 93 years old.  Skin check.  Lung cancer screening. You may have this screening every year starting at age 74 if you have a 30-pack-year history of smoking and currently smoke or have quit within the past 15 years.  Fecal occult blood test (FOBT) of the stool. You may have this test every year starting at age 21.  Flexible sigmoidoscopy or colonoscopy. You may have a sigmoidoscopy every 5 years or  a colonoscopy every 10 years starting at age 69.  Prostate cancer screening. Recommendations will vary depending on your family history and other risks.  Hepatitis C blood test.  Hepatitis B blood test.  Sexually transmitted disease (STD) testing.  Diabetes screening. This is done by checking your blood sugar  (glucose) after you have not eaten for a while (fasting). You may have this done every 1-3 years.  Abdominal aortic aneurysm (AAA) screening. You may need this if you are a current or former smoker.  Osteoporosis. You may be screened starting at age 72 if you are at high risk. Talk with your health care provider about your test results, treatment options, and if necessary, the need for more tests. Vaccines  Your health care provider may recommend certain vaccines, such as:  Influenza vaccine. This is recommended every year.  Tetanus, diphtheria, and acellular pertussis (Tdap, Td) vaccine. You may need a Td booster every 10 years.  Zoster vaccine. You may need this after age 59.  Pneumococcal 13-valent conjugate (PCV13) vaccine. One dose is recommended after age 76.  Pneumococcal polysaccharide (PPSV23) vaccine. One dose is recommended after age 16. Talk to your health care provider about which screenings and vaccines you need and how often you need them. This information is not intended to replace advice given to you by your health care provider. Make sure you discuss any questions you have with your health care provider. Document Released: 12/13/2015 Document Revised: 08/05/2016 Document Reviewed: 09/17/2015 Elsevier Interactive Patient Education  2017 [place] Prevention in the Home Falls can cause injuries. They can happen to people of all ages. There are many things you can do to make your home safe and to help prevent falls. What can I do on the outside of my home?  Regularly fix the edges of walkways and driveways and fix any cracks.  Remove anything that might make you trip as you walk through a door, such as a raised step or threshold.  Trim any bushes or trees on the path to your home.  Use bright outdoor lighting.  Clear any walking paths of anything that might make someone trip, such as rocks or tools.  Regularly check to see if handrails are loose or  broken. Make sure that both sides of any steps have handrails.  Any raised decks and porches should have guardrails on the edges.  Have any leaves, snow, or ice cleared regularly.  Use sand or salt on walking paths during winter.  Clean up any spills in your garage right away. This includes oil or grease spills. What can I do in the bathroom?  Use night lights.  Install grab bars by the toilet and in the tub and shower. Do not use towel bars as grab bars.  Use non-skid mats or decals in the tub or shower.  If you need to sit down in the shower, use a plastic, non-slip stool.  Keep the floor dry. Clean up any water that spills on the floor as soon as it happens.  Remove soap buildup in the tub or shower regularly.  Attach bath mats securely with double-sided non-slip rug tape.  Do not have throw rugs and other things on the floor that can make you trip. What can I do in the bedroom?  Use night lights.  Make sure that you have a light by your bed that is easy to reach.  Do not use any sheets or blankets that are too big for your bed.  They should not hang down onto the floor.  Have a firm chair that has side arms. You can use this for support while you get dressed.  Do not have throw rugs and other things on the floor that can make you trip. What can I do in the kitchen?  Clean up any spills right away.  Avoid walking on wet floors.  Keep items that you use a lot in easy-to-reach places.  If you need to reach something above you, use a strong step stool that has a grab bar.  Keep electrical cords out of the way.  Do not use floor polish or wax that makes floors slippery. If you must use wax, use non-skid floor wax.  Do not have throw rugs and other things on the floor that can make you trip. What can I do with my stairs?  Do not leave any items on the stairs.  Make sure that there are handrails on both sides of the stairs and use them. Fix handrails that are  broken or loose. Make sure that handrails are as long as the stairways.  Check any carpeting to make sure that it is firmly attached to the stairs. Fix any carpet that is loose or worn.  Avoid having throw rugs at the top or bottom of the stairs. If you do have throw rugs, attach them to the floor with carpet tape.  Make sure that you have a light switch at the top of the stairs and the bottom of the stairs. If you do not have them, ask someone to add them for you. What else can I do to help prevent falls?  Wear shoes that:  Do not have high heels.  Have rubber bottoms.  Are comfortable and fit you well.  Are closed at the toe. Do not wear sandals.  If you use a stepladder:  Make sure that it is fully opened. Do not climb a closed stepladder.  Make sure that both sides of the stepladder are locked into place.  Ask someone to hold it for you, if possible.  Clearly mark and make sure that you can see:  Any grab bars or handrails.  First and last steps.  Where the edge of each step is.  Use tools that help you move around (mobility aids) if they are needed. These include:  Canes.  Walkers.  Scooters.  Crutches.  Turn on the lights when you go into a dark area. Replace any light bulbs as soon as they burn out.  Set up your furniture so you have a clear path. Avoid moving your furniture around.  If any of your floors are uneven, fix them.  If there are any pets around you, be aware of where they are.  Review your medicines with your doctor. Some medicines can make you feel dizzy. This can increase your chance of falling. Ask your doctor what other things that you can do to help prevent falls. This information is not intended to replace advice given to you by your health care provider. Make sure you discuss any questions you have with your health care provider. Document Released: 09/12/2009 Document Revised: 04/23/2016 Document Reviewed: 12/21/2014 Elsevier  Interactive Patient Education  2017 [employer].

## 2020-03-11 ENCOUNTER — Telehealth: Payer: Self-pay | Admitting: Internal Medicine

## 2020-03-11 DIAGNOSIS — I359 Nonrheumatic aortic valve disorder, unspecified: Secondary | ICD-10-CM

## 2020-03-11 NOTE — Chronic Care Management (AMB) (Signed)
  Chronic Care Management   Note  03/11/2020 Name: ZAYDAN STMARIE MRN: [id number] DOB: 1941/08/15  [name] Gipe is a 79 y.o. year old male who is a primary care patient of Janith Lima, MD. I reached out to Gae Dry by phone today in response to a referral sent by Mr. Rajae Blaskowski Delon's PCP, Janith Lima, MD.   Mr. Stapleford was given information about Chronic Care Management services today including:  1. CCM service includes personalized support from designated clinical staff supervised by his physician, including individualized plan of care and coordination with other care providers 2. 24/7 contact phone numbers for assistance for urgent and routine care needs. 3. Service will only be billed when office clinical staff spend 20 minutes or more in a month to coordinate care. 4. Only one practitioner may furnish and bill the service in a calendar month. 5. The patient may stop CCM services at any time (effective at the end of the month) by phone call to the office staff.   Patient agreed to services and verbal consent obtained.   Follow up plan:   Raynicia Dukes [employer] Scheduler

## 2020-03-14 DIAGNOSIS — M4726 Other spondylosis with radiculopathy, lumbar region: Secondary | ICD-10-CM | POA: Diagnosis not present

## 2020-03-14 DIAGNOSIS — M48062 Spinal stenosis, lumbar region with neurogenic claudication: Secondary | ICD-10-CM | POA: Diagnosis not present

## 2020-03-18 ENCOUNTER — Other Ambulatory Visit: Payer: Self-pay | Admitting: Internal Medicine

## 2020-03-20 DIAGNOSIS — M48062 Spinal stenosis, lumbar region with neurogenic claudication: Secondary | ICD-10-CM | POA: Diagnosis not present

## 2020-04-01 NOTE — Addendum Note (Signed)
Addended by: CAIRRIKIER DAVIDSON, Loan [place] M on: 04/01/2020 11:58 AM   Modules accepted: Orders  

## 2020-04-04 ENCOUNTER — Ambulatory Visit: Payer: Medicare Other | Admitting: Pharmacist

## 2020-04-04 ENCOUNTER — Other Ambulatory Visit: Payer: Self-pay

## 2020-04-04 DIAGNOSIS — E118 Type 2 diabetes mellitus with unspecified complications: Secondary | ICD-10-CM

## 2020-04-04 DIAGNOSIS — I10 Essential (primary) hypertension: Secondary | ICD-10-CM

## 2020-04-04 DIAGNOSIS — E785 Hyperlipidemia, unspecified: Secondary | ICD-10-CM

## 2020-04-04 NOTE — Patient Instructions (Addendum)
Visit Information  Thank you for meeting with me to discuss your medications! I look forward to working with you to achieve your health care goals. Below is a summary of what we talked about during the visit:  Goals Addressed            This Visit's Progress   . Pharmacy Care Plan       CARE PLAN ENTRY  Current Barriers:  . Chronic Disease Management support, education, and care coordination needs related to Hypertension, Hyperlipidemia, and Diabetes   Hypertension . Pharmacist Clinical Goal(s): o Over the next 120 days, patient will work with PharmD and providers to maintain BP goal <140/90 . Current regimen:  o Atenolol-chlorthalidone 100-25 mg daily o Losartan 100 mg daily . Interventions: o Discussed importance of adherence to medications o Discussed benefits of monitoring blood pressure at home . Patient self care activities - Over the next 120 days, patient will: o Check BP daily, document, and provide at future appointments o Ensure daily salt intake < 2300 mg/day  Hyperlipidemia . Pharmacist Clinical Goal(s): o Over the next 120 days, patient will work with PharmD and providers to maintain LDL goal < 100 . Current regimen:  o Pravastatin 40 mg daily . Interventions: o Discussed complications of high cholesterol and benefits of statin drugs for prevention of disease . Patient self care activities - Over the next 120 days, patient will: o Continue pravastatin as prescribed o Limit high-cholesterol foods  Diabetes . Pharmacist Clinical Goal(s): o Over the next 120 days, patient will work with PharmD and providers to maintain A1c goal  <7.5% . Current regimen:  o Januvia 100 mg daily o Steglatro 5 mg daily . Interventions: o Discussed consequences of elevated blood sugar, including heart disease, kidney damage, and eye damage o Discussed blood sugar goals - Fasting blood sugar < 130 - 2-hour post-meal sugar < 180 . Patient self care activities - Over the next  120 days, patient will: o Check blood sugar 1-2 times weekly in the morning before eating or drinking and at bedtime, document, and provide at future appointments o Contact provider with any episodes of hypoglycemia  Medication management . Pharmacist Clinical Goal(s): o Over the next 120 days, patient will work with PharmD and providers to maintain optimal medication adherence . Current pharmacy: [facility] . Interventions o Comprehensive medication review performed. o Discussed donut hole and patient assistance for Januvia, Steglatro if costs increase later in the year o Continue current medication management strategy . Patient self care activities - Over the next 120 days, patient will: o Focus on medication adherence by pill box o Take medications as prescribed o Report any questions or concerns to PharmD and/or provider(s)  Initial goal documentation        Ryan Duke was given information about Chronic Care Management services today including:  1. CCM service includes personalized support from designated clinical staff supervised by his physician, including individualized plan of care and coordination with other care providers 2. 24/7 contact phone numbers for assistance for urgent and routine care needs. 3. Standard insurance, coinsurance, copays and deductibles apply for chronic care management only during months in which we provide at least 20 minutes of these services. Most insurances cover these services at 100%, however patients may be responsible for any copay, coinsurance and/or deductible if applicable. This service may help you avoid the need for more expensive face-to-face services. 4. Only one practitioner may furnish and bill the service in a calendar  month. 5. The patient may stop CCM services at any time (effective at the end of the month) by phone call to the office staff.  Patient agreed to services and verbal consent obtained.   The patient verbalized  understanding of instructions provided today and declined a print copy of patient instruction materials.  Telephone follow up appointment with pharmacy team member scheduled for: 4 months  Charlene Brooke, PharmD Clinical Pharmacist [facility] [phone]    Diabetes Mellitus and Nutrition, Adult When you have diabetes (diabetes mellitus), it is very important to have healthy eating habits because your blood sugar (glucose) levels are greatly affected by what you eat and drink. Eating healthy foods in the appropriate amounts, at about the same times every day, can help you:  Control your blood glucose.  Lower your risk of heart disease.  Improve your blood pressure.  Reach or maintain a healthy weight. Every person with diabetes is different, and each person has different needs for a meal plan. Your health care provider may recommend that you work with a diet and nutrition specialist (dietitian) to make a meal plan that is best for you. Your meal plan may vary depending on factors such as:  The calories you need.  The medicines you take.  Your weight.  Your blood glucose, blood pressure, and cholesterol levels.  Your activity level.  Other health conditions you have, such as heart or kidney disease. How do carbohydrates affect me? Carbohydrates, also called carbs, affect your blood glucose level more than any other type of food. Eating carbs naturally raises the amount of glucose in your blood. Carb counting is a method for keeping track of how many carbs you eat. Counting carbs is important to keep your blood glucose at a healthy level, especially if you use insulin or take certain oral diabetes medicines. It is important to know how many carbs you can safely have in each meal. This is different for every person. Your dietitian can help you calculate how many carbs you should have at each meal and for each snack. Foods that contain carbs  include:  Bread, cereal, rice, pasta, and crackers.  Potatoes and corn.  Peas, beans, and lentils.  Milk and yogurt.  Fruit and juice.  Desserts, such as cakes, cookies, ice cream, and candy. How does alcohol affect me? Alcohol can cause a sudden decrease in blood glucose (hypoglycemia), especially if you use insulin or take certain oral diabetes medicines. Hypoglycemia can be a life-threatening condition. Symptoms of hypoglycemia (sleepiness, dizziness, and confusion) are similar to symptoms of having too much alcohol. If your health care provider says that alcohol is safe for you, follow these guidelines:  Limit alcohol intake to no more than 1 drink per day for nonpregnant women and 2 drinks per day for men. One drink equals 12 oz of beer, 5 oz of wine, or 1 oz of hard liquor.  Do not drink on an empty stomach.  Keep yourself hydrated with water, diet soda, or unsweetened iced tea.  Keep in mind that regular soda, juice, and other mixers may contain a lot of sugar and must be counted as carbs. What are tips for following this plan?  Reading food labels  Start by checking the serving size on the "Nutrition Facts" label of packaged foods and drinks. The amount of calories, carbs, fats, and other nutrients listed on the label is based on one serving of the item. Many items contain more than one serving  per package.  Check the total grams (g) of carbs in one serving. You can calculate the number of servings of carbs in one serving by dividing the total carbs by 15. For example, if a food has 30 g of total carbs, it would be equal to 2 servings of carbs.  Check the number of grams (g) of saturated and trans fats in one serving. Choose foods that have low or no amount of these fats.  Check the number of milligrams (mg) of salt (sodium) in one serving. Most people should limit total sodium intake to less than 2,300 mg per day.  Always check the nutrition information of foods labeled  as "low-fat" or "nonfat". These foods may be higher in added sugar or refined carbs and should be avoided.  Talk to your dietitian to identify your daily goals for nutrients listed on the label. Shopping  Avoid buying canned, premade, or processed foods. These foods tend to be high in fat, sodium, and added sugar.  Shop around the outside edge of the grocery store. This includes fresh fruits and vegetables, bulk grains, fresh meats, and fresh dairy. Cooking  Use low-heat cooking methods, such as baking, instead of high-heat cooking methods like deep frying.  Cook using healthy oils, such as olive, canola, or sunflower oil.  Avoid cooking with butter, cream, or high-fat meats. Meal planning  Eat meals and snacks regularly, preferably at the same times every day. Avoid going long periods of time without eating.  Eat foods high in fiber, such as fresh fruits, vegetables, beans, and whole grains. Talk to your dietitian about how many servings of carbs you can eat at each meal.  Eat 4-6 ounces (oz) of lean protein each day, such as lean meat, chicken, fish, eggs, or tofu. One oz of lean protein is equal to: ? 1 oz of meat, chicken, or fish. ? 1 egg. ?  cup of tofu.  Eat some foods each day that contain healthy fats, such as avocado, nuts, seeds, and fish. Lifestyle  Check your blood glucose regularly.  Exercise regularly as told by your health care provider. This may include: ? 150 minutes of moderate-intensity or vigorous-intensity exercise each week. This could be brisk walking, biking, or water aerobics. ? Stretching and doing strength exercises, such as yoga or weightlifting, at least 2 times a week.  Take medicines as told by your health care provider.  Do not use any products that contain nicotine or tobacco, such as cigarettes and e-cigarettes. If you need help quitting, ask your health care provider.  Work with a Social worker or diabetes educator to identify strategies to  manage stress and any emotional and social challenges. Questions to ask a health care provider  Do I need to meet with a diabetes educator?  Do I need to meet with a dietitian?  What number can I call if I have questions?  When are the best times to check my blood glucose? Where to find more information:  American Diabetes Association: diabetes.org  Academy of Nutrition and Dietetics: www.eatright.[employer] of Diabetes and Digestive and Kidney Diseases (NIH): [employer] Summary  A healthy meal plan will help you control your blood glucose and maintain a healthy lifestyle.  Working with a diet and nutrition specialist (dietitian) can help you make a meal plan that is best for you.  Keep in mind that carbohydrates (carbs) and alcohol have immediate effects on your blood glucose levels. It is important to count carbs and to use  alcohol carefully. This information is not intended to replace advice given to you by your health care provider. Make sure you discuss any questions you have with your health care provider. Document Revised: 10/29/2017 Document Reviewed: 12/21/2016 Elsevier Patient Education  2020 [employer].

## 2020-04-04 NOTE — Chronic Care Management (AMB) (Signed)
Chronic Care Management Pharmacy  Name: Ryan Duke  MRN: [id number] DOB: Apr 12, 1941  Chief Complaint/ HPI  Ryan Duke,  79 y.o. , male presents for their Initial CCM visit with the clinical pharmacist In office.   S: Pt presents by himself assisted with cane. He has been married to his wife for 58 years, he worked as Psychologist, forensic prior to retiring at 78. He used to be active in gardening with his wife but had a fall 3 years ago that limits his movement.   PCP : Janith Lima, MD  His chronic conditions include: HTN, PVD, HLD, T2DM, CKD3, BPH, OAB, depression  Office Visits: 12/04/19 Dr Ronnald Ramp OV: started SGLT2 for BG and BP benefit. Increased Myrbetriq to 50 mg to help with OAB. Started Vitamin D 2000 IU for low Vit D level. Trig also too high, rec'd diet changes.  09/27/28 Dr Ronnald Ramp OV: vertigo, rx'd meclizine.   Consult Visit: 04/11/19 Dr Burt Knack (cardiology): seen for aortic stenosis f/u. BP elevated and pt prefers to try weight loss and sodium restriction. SBP goal < 140.    Allergies  Allergen Reactions  . Fentanyl     Other reaction(s): Mental Status Changes (intolerance) patch  . Wilder Glade [Dapagliflozin] Other (See Comments)    gout  . Metformin And Related Other (See Comments)    acidosis  . Atorvastatin Other (See Comments)    REACTION: h/a  . Benazepril Hcl Other (See Comments)    Was not effective  . Flexeril [Cyclobenzaprine] Other (See Comments)    HALLUSINATIONS  . Oxycodone Other (See Comments)    HALLUSINATIONS    Medications: Outpatient Encounter Medications as of 04/04/2020  Medication Sig  . acetaminophen (TYLENOL) 500 MG tablet Take 1,000 mg by mouth 2 (two) times daily as needed for moderate pain.  Marland Kitchen atenolol-chlorthalidone (TENORETIC) 100-25 MG tablet TAKE 1 TABLET ONCE DAILY.  Marland Kitchen Cholecalciferol 50 MCG (2000 UT) TABS Take 1 tablet (2,000 Units total) by mouth daily.  . DULoxetine (CYMBALTA) 60 MG capsule TAKE (1) CAPSULE DAILY.  Marland Kitchen  Ertugliflozin L-PyroglutamicAc (STEGLATRO) 5 MG TABS Take 5 mg by mouth daily before breakfast.  . JANUVIA 100 MG tablet TAKE 1 TABLET ONCE DAILY.  Marland Kitchen losartan (COZAAR) 100 MG tablet TAKE 1 TABLET DAILY FOR HYPERTENSION.  . meclizine (ANTIVERT) 12.5 MG tablet Take 2 tablets (25 mg total) by mouth 3 (three) times daily as needed for dizziness.  . Multiple Vitamin (MULTIVITAMIN WITH MINERALS) TABS tablet Take 1 tablet by mouth 2 (two) times a week.   . Multiple Vitamins-Minerals (PRESERVISION AREDS PO) Take by mouth.  . pravastatin (PRAVACHOL) 40 MG tablet TAKE 1 TABLET ONCE DAILY.  . tamsulosin (FLOMAX) 0.4 MG CAPS capsule TAKE (1) CAPSULE DAILY.  . vitamin B-12 (CYANOCOBALAMIN) 500 MCG tablet Take 500 mcg by mouth daily.  Marland Kitchen LORazepam (ATIVAN) 0.5 MG tablet Take 1 tablet (0.5 mg total) by mouth 2 (two) times daily as needed (Dizziness). (Patient not taking: Reported on 04/04/2020)  . mirabegron ER (MYRBETRIQ) 50 MG TB24 tablet Take 1 tablet (50 mg total) by mouth daily. (Patient not taking: Reported on 04/04/2020)   No facility-administered encounter medications on file as of 04/04/2020.     Current Diagnosis/Assessment:  SDOH Interventions     Most Recent Value  SDOH Interventions  SDOH Interventions for the Following Domains  Financial Strain  Financial Strain Interventions  Other (Comment) [no issues paying for medications]     Goals Addressed  This Visit's Progress   . Pharmacy Care Plan       CARE PLAN ENTRY  Current Barriers:  . Chronic Disease Management support, education, and care coordination needs related to Hypertension, Hyperlipidemia, and Diabetes   Hypertension . Pharmacist Clinical Goal(s): o Over the next 120 days, patient will work with PharmD and providers to maintain BP goal <140/90 . Current regimen:  o Atenolol-chlorthalidone 100-25 mg daily o Losartan 100 mg daily . Interventions: o Discussed importance of adherence to medications o Discussed  benefits of monitoring blood pressure at home . Patient self care activities - Over the next 120 days, patient will: o Check BP daily, document, and provide at future appointments o Ensure daily salt intake < 2300 mg/day  Hyperlipidemia . Pharmacist Clinical Goal(s): o Over the next 120 days, patient will work with PharmD and providers to maintain LDL goal < 100 . Current regimen:  o Pravastatin 40 mg daily . Interventions: o Discussed complications of high cholesterol and benefits of statin drugs for prevention of disease . Patient self care activities - Over the next 120 days, patient will: o Continue pravastatin as prescribed o Limit high-cholesterol foods  Diabetes . Pharmacist Clinical Goal(s): o Over the next 120 days, patient will work with PharmD and providers to maintain A1c goal  <7.5% . Current regimen:  o Januvia 100 mg daily o Steglatro 5 mg daily . Interventions: o Discussed consequences of elevated blood sugar, including heart disease, kidney damage, and eye damage o Discussed blood sugar goals - Fasting blood sugar < 130 - 2-hour post-meal sugar < 180 . Patient self care activities - Over the next 120 days, patient will: o Check blood sugar 1-2 times weekly in the morning before eating or drinking and at bedtime, document, and provide at future appointments o Contact provider with any episodes of hypoglycemia  Medication management . Pharmacist Clinical Goal(s): o Over the next 120 days, patient will work with PharmD and providers to maintain optimal medication adherence . Current pharmacy: [facility] . Interventions o Comprehensive medication review performed. o Discussed donut hole and patient assistance for Januvia, Steglatro if costs increase later in the year o Continue current medication management strategy . Patient self care activities - Over the next 120 days, patient will: o Focus on medication adherence by pill box o Take medications as  prescribed o Report any questions or concerns to PharmD and/or provider(s)  Initial goal documentation        Hypertension   BP Goal today is:  <140/90  Office blood pressures are  BP Readings from Last 3 Encounters:  02/28/20 122/80  12/04/19 (!) 158/76  09/28/19 126/78   Kidney Function Lab Results  Component Value Date/Time   CREATININE 1.47 12/04/2019 03:33 PM   CREATININE 1.31 (H) 01/27/2019 03:02 PM   GFR 46.00 (L) 12/04/2019 03:33 PM   GFRNONAA 51 (L) 01/27/2019 03:02 PM   Patient has failed these meds in the past: benazepril (ineffective) Patient is currently controlled on the following medications:   Atenolol-chlorthalidone 100-25 mg daily  Losartan 100 mg daily  Patient checks BP at home daily  Patient home BP readings are ranging: 130/70s-150/80 (elevated only prior to taking meds in the morning)  We discussed diet and exercise extensively; blood pressure goals and benefits of medications.  Plan  Continue current medications     Hyperlipidemia/PVD   Lipid Panel     Component Value Date/Time   CHOL 164 12/04/2019 1533   TRIG 189.0 (H)  12/04/2019 1533   TRIG 171 (H) 10/06/2006 0741   HDL 40.40 12/04/2019 1533   CHOLHDL 4 12/04/2019 1533   VLDL 37.8 12/04/2019 1533   LDLCALC 85 12/04/2019 1533   LDLDIRECT 81.0 05/26/2018 1547     The ASCVD Risk score (Goff DC Jr., et al., 2013) failed to calculate for the following reasons:   The 2013 ASCVD risk score is only valid for ages 62 to 33   Patient has failed these meds in past: atorvastatin Patient is currently controlled on the following medications:   Pravastatin 40 mg daily  We discussed:  diet and exercise extensively; LDL goal and benefits of statin for ASCVD prevention.  Plan  Continue current medications and control with diet and exercise  Diabetes   Recent Relevant Labs: Lab Results  Component Value Date/Time   HGBA1C 7.4 (A) 12/04/2019 03:10 PM   HGBA1C 7.2 (A) 06/01/2019  04:30 PM   HGBA1C 7.9 (H) 11/17/2018 08:36 AM   GFR 46.00 (L) 12/04/2019 03:33 PM   GFR 47.89 (L) 11/17/2018 08:36 AM   MICROALBUR 26.5 (H) 12/04/2019 03:33 PM   MICROALBUR 14.5 (H) 05/26/2018 03:47 PM    Checking BG: Rarely  Patient has failed these meds in past: Farxiga (gout), metformin (acidosis), glimepiride, Levemir, Lantus, Toujeo,  Patient is currently controlled on the following medications:   Januvia 100 mg daily  Steglatro 5 mg daily  Last diabetic Eye exam:  Lab Results  Component Value Date/Time   HMDIABEYEEXA No Retinopathy 02/21/2019 12:00 AM    Last diabetic Foot exam:  Lab Results  Component Value Date/Time   HMDIABFOOTEX done 05/26/2018 12:00 AM     We discussed: diet and exercise extensively; A1c meaning and goals; fasting and HS BG goals; pt reports he does like to eat sweets, discussed consequences of elevated BG including renal, retinal, and cardiac complications.  Plan  Continue current medications and control with diet and exercise   Depression   Depression screen [facility] 2/9 02/28/2020 12/04/2019 09/28/2019  Decreased Interest 0 2 0  Down, Depressed, Hopeless 0 1 0  PHQ - 2 Score 0 3 0  Altered sleeping - 1 0  Tired, decreased energy - 1 1  Change in appetite - 2 0  Feeling bad or failure about yourself  - 1 0  Trouble concentrating - 1 0  Moving slowly or fidgety/restless - 0 0  Suicidal thoughts - 0 0  PHQ-9 Score - 9 1  Difficult doing work/chores - Not difficult at all Not difficult at all   Patient has failed these meds in past: n/a Patient is currently controlled on the following medications:   Duloxetine 60 mg daily  Lorazepam 0.5 mg BID prn  We discussed:  Pt reports doing well on duloxetine, lorazepam was prescribed for dizziness and he rarely takes this.  Plan  Continue current medications  BPH/OAB   Patient has failed these meds in past: Myrbetriq Patient is currently controlled on the following medications:   Tamsulosin  0.4 mg daily  Mirabegron ER 50 mg daily - stopped taking  We discussed:  Pt has stopped taking Myrbetriq due to lack of benefit. He plans to discuss urinary issues with PCP.  Plan  Continue current medications  Health Maintenance   Patient is currently controlled on the following medications:   Tylenol 500 mg - 2 tablets BID prn  Meclizine 12.5 mg TID prn  Vitamin D 4000 IU daily  Multivitamin  Vitamin B12  Preservision  We discussed:  Patient is satisfied with current OTC regimen and denies issues  Plan  Continue current medications   Medication Management   Pt uses [facility] for all medications Uses pill box? Yes Pt endorses 100% compliance  We discussed: [facility] preferred pharmacy with his insurance, he has used them for many years and is very satisfied.  Plan  Continue current medication management strategy    Follow up: 4 month phone visit  Charlene Brooke, PharmD Clinical Pharmacist [facility] [phone]

## 2020-04-12 ENCOUNTER — Ambulatory Visit: Payer: Medicare Other | Admitting: Cardiovascular Disease

## 2020-04-12 ENCOUNTER — Encounter: Payer: Self-pay | Admitting: Cardiovascular Disease

## 2020-04-12 ENCOUNTER — Other Ambulatory Visit: Payer: Self-pay

## 2020-04-12 DIAGNOSIS — I350 Nonrheumatic aortic (valve) stenosis: Secondary | ICD-10-CM

## 2020-04-12 DIAGNOSIS — I10 Essential (primary) hypertension: Secondary | ICD-10-CM | POA: Diagnosis not present

## 2020-04-12 DIAGNOSIS — E782 Mixed hyperlipidemia: Secondary | ICD-10-CM | POA: Diagnosis not present

## 2020-04-12 NOTE — Patient Instructions (Signed)
Medication Instructions:  Your provider recommends that you continue on your current medications as directed. Please refer to the Current Medication list given to you today.   *If you need a refill on your cardiac medications before your next appointment, please call your pharmacy*  Follow-Up: You will be called to arrange your 6 month echo and office visit with Dr. Burt Knack when his November schedule is available.

## 2020-04-12 NOTE — Progress Notes (Signed)
Cardiology Office Note:    Date:  04/12/2020   ID:  Ryan Duke, DOB 1941/04/13, MRN [id number]  PCP:  Janith Lima, MD  Cardiologist:  Sherren Mocha, MD  Electrophysiologist:  None   Referring MD: Janith Lima, MD   Chief Complaint  Patient presents with  . Shortness of Breath    History of Present Illness:    Ryan Duke is a 79 y.o. male with a hx of aortic stenosis, presenting for follow-up evaluation.  The patient was last seen in a telehealth visit 1 year ago.  He has been followed for moderate aortic stenosis.  He recently had an echocardiogram and presents today for his annual follow-up evaluation.  The patient complains of shortness of breath with activity.  He denies orthopnea, PND, chest discomfort, lightheadedness, syncope, or heart palpitations.  He has gained weight since his last visit.  No other specific complaints today.  Past Medical History:  Diagnosis Date  . Diabetes mellitus    type 2  . Hyperlipidemia   . Hypertension   . Hypogonadism, male   . Pancreatitis 2008  . Renal insufficiency     Past Surgical History:  Procedure Laterality Date  . EYE SURGERY     cataracts  . FLEXIBLE SIGMOIDOSCOPY N/A 09/10/2017   Procedure: FLEXIBLE SIGMOIDOSCOPY;  Surgeon: Mauri Pole, MD;  Location: [facility];  Service: Endoscopy;  Laterality: N/A;  . IR KYPHO LUMBAR INC FX REDUCE BONE BX UNI/BIL CANNULATION INC/IMAGING  09/14/2017  . IR RADIOLOGIST EVAL & MGMT  10/07/2017    Current Medications: Current Meds  Medication Sig  . acetaminophen (TYLENOL) 500 MG tablet Take 1,000 mg by mouth 2 (two) times daily as needed for moderate pain.  Marland Kitchen atenolol-chlorthalidone (TENORETIC) 100-25 MG tablet TAKE 1 TABLET ONCE DAILY.  Marland Kitchen Cholecalciferol 50 MCG (2000 UT) TABS Take 1 tablet (2,000 Units total) by mouth daily.  . DULoxetine (CYMBALTA) 60 MG capsule TAKE (1) CAPSULE DAILY.  Marland Kitchen Ertugliflozin L-PyroglutamicAc (STEGLATRO) 5 MG TABS Take 5 mg by mouth  daily before breakfast.  . JANUVIA 100 MG tablet TAKE 1 TABLET ONCE DAILY.  Marland Kitchen losartan (COZAAR) 100 MG tablet TAKE 1 TABLET DAILY FOR HYPERTENSION.  . meclizine (ANTIVERT) 12.5 MG tablet Take 2 tablets (25 mg total) by mouth 3 (three) times daily as needed for dizziness.  . mirabegron ER (MYRBETRIQ) 50 MG TB24 tablet Take 1 tablet (50 mg total) by mouth daily.  . Multiple Vitamin (MULTIVITAMIN WITH MINERALS) TABS tablet Take 1 tablet by mouth 2 (two) times a week.   . Multiple Vitamins-Minerals (PRESERVISION AREDS PO) Take by mouth.  . pravastatin (PRAVACHOL) 40 MG tablet TAKE 1 TABLET ONCE DAILY.  Marland Kitchen pregabalin (LYRICA) 150 MG capsule Take 150 mg by mouth 3 (three) times daily.  . tamsulosin (FLOMAX) 0.4 MG CAPS capsule TAKE (1) CAPSULE DAILY.  . vitamin B-12 (CYANOCOBALAMIN) 500 MCG tablet Take 500 mcg by mouth daily.     Allergies:   Fentanyl, Farxiga [dapagliflozin], Metformin and related, Atorvastatin, Benazepril hcl, Flexeril [cyclobenzaprine], and Oxycodone   Social History   Socioeconomic History  . Marital status: Married    Spouse name: Not on file  . Number of children: Not on file  . Years of education: Not on file  . Highest education level: Not on file  Occupational History  . Occupation: retired  Tobacco Use  . Smoking status: Former Smoker    Quit date: 11/30/1974    Years since quitting: 45.3  .  Smokeless tobacco: Never Used  Substance and Sexual Activity  . Alcohol use: Not Currently    Alcohol/week: 0.0 standard drinks  . Drug use: No  . Sexual activity: Yes  Other Topics Concern  . Not on file  Social History Narrative   Regular exercise-yes   Social Determinants of Health   Financial Resource Strain: Low Risk   . Difficulty of Paying Living Expenses: Not hard at all  Food Insecurity:   . Worried About Charity fundraiser in the Last Year:   . Arboriculturist in the Last Year:   Transportation Needs:   . Film/video editor (Medical):   Marland Kitchen Lack of  Transportation (Non-Medical):   Physical Activity:   . Days of Exercise per Week:   . Minutes of Exercise per Session:   Stress:   . Feeling of Stress :   Social Connections:   . Frequency of Communication with Friends and Family:   . Frequency of Social Gatherings with Friends and Family:   . Attends Religious Services:   . Active Member of Clubs or Organizations:   . Attends Archivist Meetings:   Marland Kitchen Marital Status:      Family History: The patient's family history includes Hypertension in an other family member.  ROS:   Please see the history of present illness.    All other systems reviewed and are negative.  EKGs/Labs/Other Studies Reviewed:    The following studies were reviewed today: Echo 02/12/2020: ECHOCARDIOGRAM REPORT       Patient Name:  Ryan Duke Lipson Date of Exam: 02/12/2020  Medical Rec #: [id number]    Height:    71.0 in  Accession #:  [id number]   Weight:    241.0 lb  Date of Birth: [birth year]    BSA:     2.282 m  Patient Age:  28 years    BP:      145/83 mmHg  Patient Gender: M        HR:      49 bpm.  Exam Location: [facility]   Procedure: 2D Echo, Cardiac Doppler and Color Doppler   Indications:  I35.0 Nonrheumatic aortic (valve) stenosis    History:    Patient has prior history of Echocardiogram examinations,  most         recent 01/27/2018. Risk Factors:Hypertension, Diabetes and         Dyslipidemia.    Sonographer:  Diamond Nickel RCS  Referring Phys: [place]    1. LV function is vigorous There is no significant obstruction to LV  outflow at rest. . Left ventricular ejection fraction, by estimation, is  60 to 65%. The left ventricle has normal function. The left ventricle has  no regional wall motion  abnormalities. There is severe asymmetric left ventricular hypertrophy.  Left ventricular diastolic parameters are  consistent with Grade I  diastolic dysfunction (impaired relaxation).  2. Right ventricular systolic function is normal. The right ventricular  size is normal.  3. Trivial mitral valve regurgitation.  4. AV is thickened, calcified with restricted motioin. Peak and mean  gradients thorugh the valve are 44 and 26 mm Hg respectively. LVOT/ AV VTI  ratio: 0.35   Compared to echo report from 2019, mean gradient is less. Aortic valve  regurgitation is mild. Moderate aortic valve stenosis.  5. The inferior vena cava is normal in size with greater than 50%  respiratory variability, suggesting right atrial pressure of 3  mmHg.   FINDINGS  Left Ventricle: LV function is vigorous There is no significant  obstruction to LV outflow at rest. Left ventricular ejection fraction, by  estimation, is 60 to 65%. The left ventricle has normal function. The left  ventricle has no regional wall motion  abnormalities. The left ventricular internal cavity size was normal in  size. There is severe asymmetric left ventricular hypertrophy. Left  ventricular diastolic parameters are consistent with Grade I diastolic  dysfunction (impaired relaxation).   Right Ventricle: The right ventricular size is normal. No increase in  right ventricular wall thickness. Right ventricular systolic function is  normal.   Left Atrium: Left atrial size was normal in size.   Right Atrium: Right atrial size was normal in size.   Pericardium: There is no evidence of pericardial effusion.   Mitral Valve: The mitral valve is abnormal. There is mild thickening of  the mitral valve leaflet(s). Mild to moderate mitral annular  calcification. Trivial mitral valve regurgitation.   Tricuspid Valve: The tricuspid valve is normal in structure. Tricuspid  valve regurgitation is trivial.   Aortic Valve: LVOT/ AV VTI ratio: 0.35  Compared to echo report from 2019, mean gradient is less. The aortic valve  is abnormal. Aortic valve  regurgitation is mild. Moderate aortic stenosis  is present. Aortic valve mean gradient measures 26.0 mmHg. Aortic valve  peak gradient measures 43.8 mmHg.  Aortic valve area, by VTI measures 1.16 cm.   Pulmonic Valve: The pulmonic valve was grossly normal. Pulmonic valve  regurgitation is not visualized.   Aorta: The aortic root is normal in size and structure.   Venous: The inferior vena cava is normal in size with greater than 50%  respiratory variability, suggesting right atrial pressure of 3 mmHg.   IAS/Shunts: No atrial level shunt detected by color flow Doppler.     LEFT VENTRICLE  PLAX 2D  LVIDd:     4.90 cm Diastology  LVIDs:     3.30 cm LV e' lateral:  4.33 cm/s  LV PW:     1.30 cm LV E/e' lateral: 12.0  LV IVS:    1.90 cm LV e' medial:  3.41 cm/s  LVOT diam:   2.05 cm LV E/e' medial: 15.3  LV SV:     92  LV SV Index:  40  LVOT Area:   3.30 cm     RIGHT VENTRICLE  RV Basal diam: 2.20 cm  RV S prime:   9.00 cm/s  TAPSE (M-mode): 1.3 cm   LEFT ATRIUM       Index    RIGHT ATRIUM      Index  LA diam:    3.70 cm 1.62 cm/m RA Area:   15.90 cm  LA Vol (A2C):  88.3 ml 38.69 ml/m RA Volume:  28.80 ml 12.62 ml/m  LA Vol (A4C):  77.5 ml 33.95 ml/m  LA Biplane Vol: 86.4 ml 37.85 ml/m  AORTIC VALVE  AV Area (Vmax):  1.13 cm  AV Area (Vmean):  1.04 cm  AV Area (VTI):   1.16 cm  AV Vmax:      331.00 cm/s  AV Vmean:     241.000 cm/s  AV VTI:      0.794 m  AV Peak Grad:   43.8 mmHg  AV Mean Grad:   26.0 mmHg  LVOT Vmax:     113.00 cm/s  LVOT Vmean:    75.900 cm/s  LVOT VTI:     0.280 m  LVOT/AV  VTI ratio: 0.35    AORTA  Ao Root diam: 3.60 cm   MITRAL VALVE  MV Area (PHT): 1.91 cm  SHUNTS  MV Decel Time: 396 msec  Systemic VTI: 0.28 m  MV E velocity: 52.00 cm/s Systemic Diam: 2.05 cm  MV A velocity: 77.83 cm/s  MV E/A ratio: 0.67   EKG:   EKG is ordered today.  The ekg ordered today demonstrates sinus brady 54 bpm, cannot rule out age-indeterminate anterior MI, LAD  Recent Labs: 12/04/2019: ALT 10; BUN 23; Creatinine, Ser 1.47; Hemoglobin 14.3; Platelets 234.0; Potassium 4.0; Sodium 139; TSH 3.61  Recent Lipid Panel    Component Value Date/Time   CHOL 164 12/04/2019 1533   TRIG 189.0 (H) 12/04/2019 1533   TRIG 171 (H) 10/06/2006 0741   HDL 40.40 12/04/2019 1533   CHOLHDL 4 12/04/2019 1533   VLDL 37.8 12/04/2019 1533   LDLCALC 85 12/04/2019 1533   LDLDIRECT 81.0 05/26/2018 1547    Physical Exam:    VS:  BP (!) 148/86   Pulse (!) 54   Ht 5\' 11"  (1.803 m)   Wt 243 lb (110.2 kg)   BMI 33.89 kg/m     Wt Readings from Last 3 Encounters:  04/12/20 243 lb (110.2 kg)  02/28/20 241 lb (109.3 kg)  12/04/19 241 lb (109.3 kg)     GEN:  Well nourished, well developed in no acute distress HEENT: Normal NECK: No JVD; No carotid bruits LYMPHATICS: No lymphadenopathy CARDIAC: RRR, 3/6 harsh crescendo decrescendo murmur at the right upper sternal border, muffled A2. RESPIRATORY:  Clear to auscultation without rales, wheezing or rhonchi  ABDOMEN: Soft, non-tender, non-distended MUSCULOSKELETAL:  No edema; No deformity  SKIN: Warm and dry NEUROLOGIC:  Alert and oriented x 3 PSYCHIATRIC:  Normal affect   ASSESSMENT:    1. Nonrheumatic aortic valve stenosis   2. Essential hypertension   3. Mixed hyperlipidemia    PLAN:    In order of problems listed above:  1. I personally reviewed the patient's recent echo images.  His aortic valve is severely calcified and moderately restricted.  Hemodynamic findings/Doppler data demonstrate findings consistent with moderate aortic stenosis with a mean transvalvular gradient of 26 mmHg and a dimensionless index greater than 0.25.  LV systolic function is vigorous.  I suspect his shortness of breath is multifactorial and significantly impacted by weight gain.  I went back and trended his  weights and his weight is increased by 30 pounds over the last 2 years.  We had specific discussion about weight loss strategies including dietary modification and increased exercise.  He has a bad right knee and will need to start using his recumbent bike.  He plans to cut out sweets and pasta.  He feels like he is managing weight loss without formal dietitian consult.  I would like to see him back in 6 months for follow-up evaluation with a repeat echo at that time. 2. Blood pressure is controlled.  Current medications reviewed.  He reports average home readings of [id number] mmHg systolic. 3. Treated with pravastatin.  Lipids reviewed with an LDL cholesterol 85, HDL 40, triglycerides 189.  He was intolerant of atorvastatin.  We discussed lifestyle modification at length.   Medication Adjustments/Labs and Tests Ordered: Current medicines are reviewed at length with the patient today.  Concerns regarding medicines are outlined above.  No orders of the defined types were placed in this encounter.  No orders of the defined types were placed in this encounter.  There are no Patient Instructions on file for this visit.   Signed, Sherren Mocha, MD  04/12/2020 9:16 AM    [place]

## 2020-04-17 DIAGNOSIS — M48062 Spinal stenosis, lumbar region with neurogenic claudication: Secondary | ICD-10-CM | POA: Diagnosis not present

## 2020-04-17 DIAGNOSIS — M4726 Other spondylosis with radiculopathy, lumbar region: Secondary | ICD-10-CM | POA: Diagnosis not present

## 2020-04-24 DIAGNOSIS — M48062 Spinal stenosis, lumbar region with neurogenic claudication: Secondary | ICD-10-CM | POA: Diagnosis not present

## 2020-05-17 DIAGNOSIS — M4726 Other spondylosis with radiculopathy, lumbar region: Secondary | ICD-10-CM | POA: Diagnosis not present

## 2020-05-17 DIAGNOSIS — M48062 Spinal stenosis, lumbar region with neurogenic claudication: Secondary | ICD-10-CM | POA: Diagnosis not present

## 2020-06-04 ENCOUNTER — Encounter: Payer: Self-pay | Admitting: Internal Medicine

## 2020-06-04 ENCOUNTER — Other Ambulatory Visit: Payer: Self-pay

## 2020-06-04 ENCOUNTER — Ambulatory Visit (INDEPENDENT_AMBULATORY_CARE_PROVIDER_SITE_OTHER): Payer: Medicare Other | Admitting: Internal Medicine

## 2020-06-04 ENCOUNTER — Other Ambulatory Visit: Payer: Self-pay | Admitting: Internal Medicine

## 2020-06-04 VITALS — BP 146/70 | HR 68 | Temp 98.1°F | Resp 16 | Ht 71.0 in | Wt 242.0 lb

## 2020-06-04 DIAGNOSIS — E118 Type 2 diabetes mellitus with unspecified complications: Secondary | ICD-10-CM

## 2020-06-04 DIAGNOSIS — N1831 Chronic kidney disease, stage 3a: Secondary | ICD-10-CM

## 2020-06-04 DIAGNOSIS — I10 Essential (primary) hypertension: Secondary | ICD-10-CM | POA: Diagnosis not present

## 2020-06-04 LAB — BASIC METABOLIC PANEL WITH GFR
BUN: 23 mg/dL (ref 6–23)
CO2: 31 meq/L (ref 19–32)
Calcium: 9.2 mg/dL (ref 8.4–10.5)
Chloride: 99 meq/L (ref 96–112)
Creatinine, Ser: 1.49 mg/dL (ref 0.40–1.50)
GFR: 45.23 mL/min — ABNORMAL LOW
Glucose, Bld: 280 mg/dL — ABNORMAL HIGH (ref 70–99)
Potassium: 3.9 meq/L (ref 3.5–5.1)
Sodium: 138 meq/L (ref 135–145)

## 2020-06-04 LAB — POCT GLYCOSYLATED HEMOGLOBIN (HGB A1C): Hemoglobin A1C: 8.9 % — AB (ref 4.0–5.6)

## 2020-06-04 MED ORDER — NOVOFINE 32G X 6 MM MISC: 1.0000 | Freq: Every day | 1 refills | Status: DC

## 2020-06-04 MED ORDER — TOUJEO MAX SOLOSTAR 300 UNIT/ML ~~LOC~~ SOPN: 20.0000 [IU] | PEN_INJECTOR | Freq: Every day | SUBCUTANEOUS | 0 refills | Status: DC

## 2020-06-04 MED ORDER — ACCU-CHEK GUIDE ME W/DEVICE KIT: 1.0000 | PACK | Freq: Three times a day (TID) | 0 refills | Status: DC

## 2020-06-04 MED ORDER — STEGLATRO 15 MG PO TABS: 15.0000 mg | ORAL_TABLET | Freq: Every day | ORAL | 1 refills | Status: DC

## 2020-06-04 NOTE — Progress Notes (Signed)
Subjective:  Patient ID: Ryan Duke, male    DOB: [birth year]  Age: 79 y.o. MRN: [id number]  CC: Diabetes  This visit occurred during the SARS-CoV-2 public health emergency.  Safety protocols were in place, including screening questions prior to the visit, additional usage of staff PPE, and extensive cleaning of exam room while observing appropriate contact time as indicated for disinfecting solutions.    HPI Ryan Duke presents for f/up - He reports a stable degree of dyspnea on exertion.  He denies any recent episodes of chest pain, diaphoresis, dizziness, lightheadedness, or near syncope.  He does not monitor his blood sugar and has not been making any progress on his lifestyle modifications.  About 6 months ago I prescribed an SGLT2 inhibitor.  He took samples and did well on it but then did not request a prescription.  He has not taken it for several months now.  Outpatient Medications Prior to Visit  Medication Sig Dispense Refill   atenolol-chlorthalidone (TENORETIC) 100-25 MG tablet TAKE 1 TABLET ONCE DAILY. 90 tablet 0   Cholecalciferol 50 MCG (2000 UT) TABS Take 1 tablet (2,000 Units total) by mouth daily. 90 tablet 1   DULoxetine (CYMBALTA) 60 MG capsule TAKE (1) CAPSULE DAILY. 90 capsule 0   JANUVIA 100 MG tablet TAKE 1 TABLET ONCE DAILY. 90 tablet 1   losartan (COZAAR) 100 MG tablet TAKE 1 TABLET DAILY FOR HYPERTENSION. 90 tablet 0   meclizine (ANTIVERT) 12.5 MG tablet Take 2 tablets (25 mg total) by mouth 3 (three) times daily as needed for dizziness. 45 tablet 1   mirabegron ER (MYRBETRIQ) 50 MG TB24 tablet Take 1 tablet (50 mg total) by mouth daily. 90 tablet 1   Multiple Vitamin (MULTIVITAMIN WITH MINERALS) TABS tablet Take 1 tablet by mouth 2 (two) times a week.      Multiple Vitamins-Minerals (PRESERVISION AREDS PO) Take by mouth.     pravastatin (PRAVACHOL) 40 MG tablet TAKE 1 TABLET ONCE DAILY. 90 tablet 0   pregabalin (LYRICA) 150 MG capsule Take 150  mg by mouth 3 (three) times daily.     tamsulosin (FLOMAX) 0.4 MG CAPS capsule TAKE (1) CAPSULE DAILY. 90 capsule 0   vitamin B-12 (CYANOCOBALAMIN) 500 MCG tablet Take 500 mcg by mouth daily.     acetaminophen (TYLENOL) 500 MG tablet Take 1,000 mg by mouth 2 (two) times daily as needed for moderate pain.     Ertugliflozin L-PyroglutamicAc (STEGLATRO) 5 MG TABS Take 5 mg by mouth daily before breakfast. 70 tablet 0   No facility-administered medications prior to visit.    ROS Review of Systems  Constitutional: Negative for diaphoresis, fatigue and unexpected weight change.  HENT: Negative.   Respiratory: Positive for shortness of breath. Negative for cough, chest tightness and wheezing.   Cardiovascular: Negative for chest pain, palpitations and leg swelling.  Gastrointestinal: Negative for abdominal pain, constipation, diarrhea, nausea and vomiting.  Endocrine: Negative for polydipsia, polyphagia and polyuria.  Genitourinary: Negative.  Negative for difficulty urinating and dysuria.  Musculoskeletal: Negative for arthralgias and myalgias.  Skin: Negative.  Negative for color change, pallor and rash.  Neurological: Negative for dizziness, weakness, light-headedness, numbness and headaches.  Hematological: Negative for adenopathy. Does not bruise/bleed easily.  Psychiatric/Behavioral: Negative.     Objective:  BP (!) 146/70 (BP Location: Left Arm, Patient Position: Sitting, Cuff Size: Normal)    Pulse 68    Temp 98.1 F (36.7 C) (Oral)    Resp 16    Ht  5' 11"  (1.803 m)    Wt 242 lb (109.8 kg)    SpO2 94%    BMI 33.75 kg/m   BP Readings from Last 3 Encounters:  06/04/20 (!) 146/70  04/12/20 (!) 148/86  02/28/20 122/80    Wt Readings from Last 3 Encounters:  06/04/20 242 lb (109.8 kg)  04/12/20 243 lb (110.2 kg)  02/28/20 241 lb (109.3 kg)    Physical Exam Vitals reviewed.  Constitutional:      Appearance: He is obese.  HENT:     Nose: Nose normal.     Mouth/Throat:      Mouth: Mucous membranes are moist.  Eyes:     General: No scleral icterus.    Conjunctiva/sclera: Conjunctivae normal.  Cardiovascular:     Rate and Rhythm: Normal rate and regular rhythm.     Heart sounds: Murmur heard.  Systolic murmur is present with a grade of 3/6.  No gallop.   Pulmonary:     Effort: Pulmonary effort is normal.     Breath sounds: No stridor. No wheezing, rhonchi or rales.  Abdominal:     General: Abdomen is flat.     Palpations: There is no mass.     Tenderness: There is no abdominal tenderness. There is no guarding.  Musculoskeletal:        General: Normal range of motion.     Cervical back: Neck supple.     Right lower leg: No edema.     Left lower leg: No edema.  Lymphadenopathy:     Cervical: No cervical adenopathy.  Skin:    General: Skin is warm and dry.     Coloration: Skin is not pale.  Neurological:     General: No focal deficit present.     Mental Status: He is alert.  Psychiatric:        Mood and Affect: Mood normal.        Behavior: Behavior normal.     Lab Results  Component Value Date   WBC 8.0 12/04/2019   HGB 14.3 12/04/2019   HCT 42.8 12/04/2019   PLT 234.0 12/04/2019   GLUCOSE 280 (H) 06/04/2020   CHOL 164 12/04/2019   TRIG 189.0 (H) 12/04/2019   HDL 40.40 12/04/2019   LDLDIRECT 81.0 05/26/2018   LDLCALC 85 12/04/2019   ALT 10 12/04/2019   AST 13 12/04/2019   NA 138 06/04/2020   K 3.9 06/04/2020   CL 99 06/04/2020   CREATININE 1.49 06/04/2020   BUN 23 06/04/2020   CO2 31 06/04/2020   TSH 3.61 12/04/2019   PSA 1.05 10/28/2015   INR 1.04 09/14/2017   HGBA1C 8.9 (A) 06/04/2020   MICROALBUR 26.5 (H) 12/04/2019    MR LUMBAR SPINE WO CONTRAST  Result Date: 06/29/2019 CLINICAL DATA:  Vertebral compression fractures status post kyphoplasty. Persistent severe back pain with right-sided radiculopathy EXAM: MRI LUMBAR SPINE WITHOUT CONTRAST TECHNIQUE: Multiplanar, multisequence MR imaging of the lumbar spine was performed.  No intravenous contrast was administered. COMPARISON:  MRI 04/19/2019 FINDINGS: Segmentation:  Standard. Alignment:  Unchanged. 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 Interval progression in the degree of vertebral body height loss of a subacute L4 vertebral body compression fracture, now with greater than 50% height loss. Mild bony retropulsion the superior aspect of the L4 vertebral body. There is persistent marrow edema within the vertebral body and extending into the pedicles bilaterally. Low T1/T2 signal centrally within  the vertebral body suggesting cement augmentation. No new fracture. Discogenic marrow changes throughout the remaining lumbar levels. Conus medullaris and cauda equina: Conus extends to the T12-L1 level. Conus and cauda equina appear normal. Paraspinal and other soft tissues: The previously seen bilateral psoas muscle fluid collections have resolved. Remaining paravertebral soft tissues are within normal limits. Disc levels: T12-L1: Mild diffuse disc bulge with left greater than right facet arthrosis. No foraminal or canal stenosis. Unchanged. L1-L2: Mild diffuse disc bulge with bilateral facet arthrosis results in mild canal stenosis without foraminal narrowing. Unchanged. L2-L3: Bony retropulsion from chronic L2 compression fracture which results in moderate canal stenosis and moderate to severe stenosis of the lateral recesses and neural foramen bilaterally. Unchanged. L3-L4: Mild bony retropulsion of the L4 vertebral body, progressed from prior. Mild diffuse disc bulge and bilateral facet arthrosis resulting in moderate canal stenosis with moderate stenosis of the lateral recesses and neural foramen bilaterally. The degree of canal stenosis has slightly progressed secondary to the L4 vertebral retropulsion. L4-L5: No significant disc protrusion. Severe bilateral facet arthrosis results in  severe narrowing of the bilateral neural foramen and moderate narrowing of the canal and bilateral subarticular recesses. Unchanged. L5-S1: Bilateral facet arthropathy without foraminal or canal stenosis. Unchanged. IMPRESSION: 1. Interval progression of L4 vertebral body compression fracture, now with greater than 50% vertebral body height loss. There is also slight increased bony retropulsion this level resulting in moderate canal stenosis, slightly progressed from prior. 2. Low signal changes within the L4 vertebral body, favored to represent interval cement augmentation. Correlate with interval procedural history. 3. Interval resolution of previously seen bilateral psoas muscle fluid collections, likely resolved intramuscular hematomas. 4. 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 5. Multilevel lumbar spondylosis at the remaining vertebral levels including moderate canal stenosis at L2-3 and L4-5, not significantly progressed compared to the prior MRI 04/18/2019. Electronically Signed   By: Davina Poke M.D.   On: 06/29/2019 10:09    Assessment & Plan:   Ellen was seen today for diabetes.  Diagnoses and all orders for this visit:  Essential hypertension- His blood pressure is adequately well controlled. -     Basic metabolic panel; Future -     Basic metabolic panel  Type II diabetes mellitus with manifestations ([place])- His A1c is up to 8.9%.  He does not tolerate Metformin.  Will continue the current dose of the DPP 4 inhibitor and I recommended that he had basal insulin and restart the SGLT2 inhibitor. -     POCT glycosylated hemoglobin (Hb A1C) -     ertugliflozin L-PyroglutamicAc (STEGLATRO) 15 MG TABS tablet; Take 1 tablet (15 mg total) by mouth daily before breakfast. -     insulin glargine, 2 Unit Dial, (TOUJEO MAX SOLOSTAR) 300 UNIT/ML Solostar Pen; Inject 20 Units into the skin daily. -     Amb Referral to Nutrition and Diabetic E -     Consult  to [facility] Care Management -     Insulin Pen Needle (NOVOFINE) 32G X 6 MM MISC; 1 Act by Does not apply route daily. -     Blood Glucose Monitoring Suppl (ACCU-CHEK GUIDE ME) w/Device KIT; 1 Act by Does not apply route 3 (three) times daily after meals. -     Basic metabolic panel; Future -     Basic metabolic panel  Stage 3a chronic kidney disease- His renal function is stable.  His blood pressure is adequately well controlled.  Will work to get better  control of his blood sugar. -     Basic metabolic panel; Future -     Basic metabolic panel   I have discontinued [name]. [place]'s acetaminophen and Steglatro. I am also having him start on Steglatro, Toujeo Max SoloStar, NovoFine, and Accu-Chek Guide Me. Additionally, I am having him maintain his multivitamin with minerals, meclizine, mirabegron ER, Cholecalciferol, Januvia, tamsulosin, atenolol-chlorthalidone, pravastatin, losartan, DULoxetine, Multiple Vitamins-Minerals (PRESERVISION AREDS PO), vitamin B-12, and pregabalin.  Meds ordered this encounter  Medications   ertugliflozin L-PyroglutamicAc (STEGLATRO) 15 MG TABS tablet    Sig: Take 1 tablet (15 mg total) by mouth daily before breakfast.    Dispense:  90 tablet    Refill:  1   insulin glargine, 2 Unit Dial, (TOUJEO MAX SOLOSTAR) 300 UNIT/ML Solostar Pen    Sig: Inject 20 Units into the skin daily.    Dispense:  9 mL    Refill:  0   Insulin Pen Needle (NOVOFINE) 32G X 6 MM MISC    Sig: 1 Act by Does not apply route daily.    Dispense:  100 each    Refill:  1   Blood Glucose Monitoring Suppl (ACCU-CHEK GUIDE ME) w/Device KIT    Sig: 1 Act by Does not apply route 3 (three) times daily after meals.    Dispense:  2 kit    Refill:  0     Follow-up: Return in about 4 months (around 10/05/2020).  [name], MD

## 2020-06-04 NOTE — Patient Instructions (Signed)
Type 2 Diabetes Mellitus, Diagnosis, Adult Type 2 diabetes (type 2 diabetes mellitus) is a long-term (chronic) disease. In type 2 diabetes, one or both of these problems may be present:  The pancreas does not make enough of a hormone called insulin.  Cells in the body do not respond properly to insulin that the body makes (insulin resistance). Normally, insulin allows blood sugar (glucose) to enter cells in the body. The cells use glucose for energy. Insulin resistance or lack of insulin causes excess glucose to build up in the blood instead of going into cells. As a result, high blood glucose (hyperglycemia) develops. What increases the risk? The following factors may make you more likely to develop type 2 diabetes:  Having a family member with type 2 diabetes.  Being overweight or obese.  Having an inactive (sedentary) lifestyle.  Having been diagnosed with insulin resistance.  Having a history of prediabetes, gestational diabetes, or polycystic ovary syndrome (PCOS).  Being of American-Indian, African-American, Hispanic/Latino, or Asian/Pacific Islander descent. What are the signs or symptoms? In the early stage of this condition, you may not have symptoms. Symptoms develop slowly and may include:  Increased thirst (polydipsia).  Increased hunger(polyphagia).  Increased urination (polyuria).  Increased urination during the night (nocturia).  Unexplained weight loss.  Frequent infections that keep coming back (recurring).  Fatigue.  Weakness.  Vision changes, such as blurry vision.  Cuts or bruises that are slow to heal.  Tingling or numbness in the hands or feet.  Dark patches on the skin (acanthosis nigricans). How is this diagnosed? This condition is diagnosed based on your symptoms, your medical history, a physical exam, and your blood glucose level. Your blood glucose may be checked with one or more of the following blood tests:  A fasting blood glucose (FBG)  test. You will not be allowed to eat (you will fast) for 8 hours or longer before a blood sample is taken.  A random blood glucose test. This test checks blood glucose at any time of day regardless of when you ate.  An A1c (hemoglobin A1c) blood test. This test provides information about blood glucose control over the previous 2-3 months.  An oral glucose tolerance test (OGTT). This test measures your blood glucose at two times: ? After fasting. This is your baseline blood glucose level. ? Two hours after drinking a beverage that contains glucose. You may be diagnosed with type 2 diabetes if:  Your FBG level is 126 mg/dL (7.0 mmol/L) or higher.  Your random blood glucose level is 200 mg/dL (11.1 mmol/L) or higher.  Your A1c level is 6.5% or higher.  Your OGTT result is higher than 200 mg/dL (11.1 mmol/L). These blood tests may be repeated to confirm your diagnosis. How is this treated? Your treatment may be managed by a specialist called an endocrinologist. Type 2 diabetes may be treated by following instructions from your health care provider about:  Making diet and lifestyle changes. This may include: ? Following an individualized nutrition plan that is developed by a diet and nutrition specialist (registered dietitian). ? Exercising regularly. ? Finding ways to manage stress.  Checking your blood glucose level as often as told.  Taking diabetes medicines or insulin daily. This helps to keep your blood glucose levels in the healthy range. ? If you use insulin, you may need to adjust the dosage depending on how physically active you are and what foods you eat. Your health care provider will tell you how to adjust your dosage.    Taking medicines to help prevent complications from diabetes, such as: ? Aspirin. ? Medicine to lower cholesterol. ? Medicine to control blood pressure. Your health care provider will set individualized treatment goals for you. Your goals will be based on  your age, other medical conditions you have, and how you respond to diabetes treatment. Generally, the goal of treatment is to maintain the following blood glucose levels:  Before meals (preprandial): 80-130 mg/dL (4.4-7.2 mmol/L).  After meals (postprandial): below 180 mg/dL (10 mmol/L).  A1c level: less than 7%. Follow these instructions at home: Questions to ask your health care provider  Consider asking the following questions: ? Do I need to meet with a diabetes educator? ? Where can I find a support group for people with diabetes? ? What equipment will I need to manage my diabetes at home? ? What diabetes medicines do I need, and when should I take them? ? How often do I need to check my blood glucose? ? What number can I call if I have questions? ? When is my next appointment? General instructions  Take over-the-counter and prescription medicines only as told by your health care provider.  Keep all follow-up visits as told by your health care provider. This is important.  For more information about diabetes, visit: ? American Diabetes Association (ADA): www.diabetes.org ? American Association of Diabetes Educators (AADE): www.diabeteseducator.org Contact a health care provider if:  Your blood glucose is at or above 240 mg/dL (13.3 mmol/L) for 2 days in a row.  You have been sick or have had a fever for 2 days or longer, and you are not getting better.  You have any of the following problems for more than 6 hours: ? You cannot eat or drink. ? You have nausea and vomiting. ? You have diarrhea. Get help right away if:  Your blood glucose is lower than 54 mg/dL (3.0 mmol/L).  You become confused or you have trouble thinking clearly.  You have difficulty breathing.  You have moderate or large ketone levels in your urine. Summary  Type 2 diabetes (type 2 diabetes mellitus) is a long-term (chronic) disease. In type 2 diabetes, the pancreas does not make enough of a  hormone called insulin, or cells in the body do not respond properly to insulin that the body makes (insulin resistance).  This condition is treated by making diet and lifestyle changes and taking diabetes medicines or insulin.  Your health care provider will set individualized treatment goals for you. Your goals will be based on your age, other medical conditions you have, and how you respond to diabetes treatment.  Keep all follow-up visits as told by your health care provider. This is important. This information is not intended to replace advice given to you by your health care provider. Make sure you discuss any questions you have with your health care provider. Document Revised: 01/14/2018 Document Reviewed: 12/20/2015 Elsevier Patient Education  2020 Elsevier Inc.  

## 2020-06-04 NOTE — Telephone Encounter (Signed)
I will do a prior auth.

## 2020-06-05 ENCOUNTER — Other Ambulatory Visit: Payer: Medicare Other | Admitting: *Deleted

## 2020-06-05 NOTE — Patient Outreach (Signed)
[facility]) Care Management  06/05/2020  Ryan Duke 1941-03-23 [id number]   Referral Received 06/04/2020 Initial Outreach 06/05/2020  Telephone Assessment  RN attempted the initial outreach however unsuccessful. RN left a HIPAA approved voice message requesting a call back.  Plan: Will plan another outreach call next week for pending services.  Raina Mina, RN Care Management Coordinator [place] Office [phone]

## 2020-06-06 ENCOUNTER — Other Ambulatory Visit: Payer: Self-pay | Admitting: Internal Medicine

## 2020-06-06 ENCOUNTER — Telehealth: Payer: Self-pay

## 2020-06-06 DIAGNOSIS — E118 Type 2 diabetes mellitus with unspecified complications: Secondary | ICD-10-CM

## 2020-06-06 MED ORDER — EMPAGLIFLOZIN 25 MG PO TABS: 25.0000 mg | ORAL_TABLET | Freq: Every day | ORAL | 1 refills | Status: DC

## 2020-06-06 NOTE — Telephone Encounter (Signed)
Key: [id number]

## 2020-06-06 NOTE — Telephone Encounter (Signed)
    Patient requesting call from [place] to discuss [facility]

## 2020-06-06 NOTE — Telephone Encounter (Signed)
New message:   Nicki Reaper is calling from [employer] and states there is still one last medication on the pt's list as an alternate and that is Juardiance. He states the other medication has been denied. Please advise.

## 2020-06-06 NOTE — Telephone Encounter (Signed)
The prior auth was denied.   Jardiance had not been tried and failed.   Do you want to change?

## 2020-06-06 NOTE — Telephone Encounter (Signed)
Pt contacted and pt stated that he was not sure about the [place]. I informed pt that I was working on the prior authorization and that I would let him know what happens and if there is any changes.

## 2020-06-07 NOTE — Telephone Encounter (Signed)
Pt informed that rx for Jardiance was sent in. Pt stated that the medication cost 125 dollars.   I will do PA for the Jardiance on Monday.

## 2020-06-11 ENCOUNTER — Other Ambulatory Visit: Payer: Self-pay | Admitting: *Deleted

## 2020-06-11 NOTE — Telephone Encounter (Signed)
I need to call pharmacy and verify information for pt prescription

## 2020-06-11 NOTE — Patient Outreach (Signed)
[facility] [facility]) Care Management  06/11/2020  Ryan Duke 06-Apr-1941 [id number]   Telephone Outreach #2  RN attempted another outreach however unsuccessful. RN able to leave a HIPAA approved voice message requesting a call back.  Plan: Will rescheduled another outreach call next week.  Raina Mina, RN Care Management Coordinator [place] Office [phone]

## 2020-06-11 NOTE — Telephone Encounter (Signed)
PA for Jardiance 25 mg tablet - Key: [id number]

## 2020-06-12 ENCOUNTER — Telehealth: Payer: Self-pay | Admitting: Pharmacist

## 2020-06-12 MED ORDER — EMPAGLIFLOZIN 25 MG PO TABS: 25.0000 mg | ORAL_TABLET | Freq: Every day | ORAL | 0 refills | Status: DC

## 2020-06-12 NOTE — Telephone Encounter (Signed)
I called [employer] to verify the price, they verified Ryan Duke is $125 for 30 day supply because patient has reached the coverage gap. Therefore all of his brand-name medication will be >$100 per month at this point (except Toujeo will always be $35).  We can try patient assistance for Jardiance, their income limit is on the low side ($52,000 for 2 person household). Pt will likely need assistance for Januvia as well now that he is in coverage gap - although I would recommend discontinuing the Januvia altogether now that he is on insulin and DPP-IV inhibitors do not offer any further CV or renal benefit.  I called patient to ask about PAP, had to leave a message. In the meantime we have Jardiance samples for him.

## 2020-06-12 NOTE — Telephone Encounter (Signed)
Per PCP - okay to cancel the Januvia. Medication list updated.  Jardiance samples given to patient - erx entered to show same and logged in sample book.   Laurita Quint. Is working with patient to apply for Patient Assistance.

## 2020-06-13 NOTE — Addendum Note (Signed)
Addended by: Aviva Signs M on: 06/13/2020 07:09 AM   Modules accepted: Orders

## 2020-06-15 ENCOUNTER — Other Ambulatory Visit: Payer: Self-pay | Admitting: Internal Medicine

## 2020-06-15 DIAGNOSIS — E118 Type 2 diabetes mellitus with unspecified complications: Secondary | ICD-10-CM

## 2020-06-15 MED ORDER — ACCU-CHEK GUIDE VI STRP: 1.0000 | ORAL_STRIP | Freq: Two times a day (BID) | 6 refills | Status: DC

## 2020-06-18 ENCOUNTER — Other Ambulatory Visit: Payer: Self-pay | Admitting: *Deleted

## 2020-06-18 ENCOUNTER — Other Ambulatory Visit: Payer: Self-pay | Admitting: Internal Medicine

## 2020-06-18 NOTE — Patient Outreach (Signed)
[facility]) Care Management  06/18/2020  Ryan Duke May 05, 1941 [id number]   Case Closure  RN attempted outreach call today however remains unsuccessful. RN able to leave a HIPAA approved voice message. There has been no response to the outreach letter sent to this pt and no call back on previous messages.  Plan: Will close this case due to no response to the outreach calls and letter mailed. Will alert pt's provider of case closure.  Raina Mina, RN Care Management Coordinator [place] Office [phone]

## 2020-06-26 ENCOUNTER — Encounter: Payer: Self-pay | Admitting: Internal Medicine

## 2020-06-27 NOTE — Progress Notes (Signed)
  06/27/2020 Name: Ryan Duke MRN: [id number] DOB: 1941-01-27 Ryan Duke is a 79 y.o. year old male who is a primary care patient of Janith Lima, MD.   Patient has [employer] and reports copay for Vania Rea is cost prohibitive at this time.  Reviewed application process for [employer] patient assistance program. Patient meets income/out of pocket spend criteria for the program. Patient will provide proof of income, out of pocket spend report, and will sign application. Will collaborate with prescriber Dr Ronnald Ramp for the provider portion of application. Once completed, will submit application by Fax   Patient assistance program Fax number: [phone]  Application is available in office for patient to sign. Jardiance samples also available in office for patient to pick up.  Charlton Haws, [facility]

## 2020-07-16 DIAGNOSIS — M48062 Spinal stenosis, lumbar region with neurogenic claudication: Secondary | ICD-10-CM | POA: Diagnosis not present

## 2020-07-16 DIAGNOSIS — M4726 Other spondylosis with radiculopathy, lumbar region: Secondary | ICD-10-CM | POA: Diagnosis not present

## 2020-07-17 ENCOUNTER — Other Ambulatory Visit: Payer: Self-pay | Admitting: Internal Medicine

## 2020-07-17 DIAGNOSIS — N3281 Overactive bladder: Secondary | ICD-10-CM

## 2020-07-26 ENCOUNTER — Telehealth: Payer: Medicare Other

## 2020-07-29 ENCOUNTER — Other Ambulatory Visit: Payer: Self-pay | Admitting: Internal Medicine

## 2020-08-20 ENCOUNTER — Other Ambulatory Visit: Payer: Self-pay | Admitting: Internal Medicine

## 2020-08-26 ENCOUNTER — Telehealth: Payer: Self-pay | Admitting: Internal Medicine

## 2020-08-26 NOTE — Telephone Encounter (Signed)
   Patient wants to know the best time of day to take insulin glargine, 2 Unit Dial, (TOUJEO MAX SOLOSTAR) 300 UNIT/ML Solostar Pen

## 2020-08-27 NOTE — Telephone Encounter (Signed)
Pt aware to take Toujeo every morning; verb understanding & has no ques/concerns at this time.

## 2020-09-14 ENCOUNTER — Ambulatory Visit: Payer: Medicare Other | Attending: Internal Medicine

## 2020-09-14 DIAGNOSIS — Z23 Encounter for immunization: Secondary | ICD-10-CM

## 2020-09-14 NOTE — Progress Notes (Signed)
   Covid-19 Vaccination Clinic  Name:  ASHANTI LITTLES    MRN: [id number] DOB: 1941/01/02  09/14/2020  Mr. Wivell was observed post Covid-19 immunization for 15 minutes without incident. He was provided with Vaccine Information Sheet and instruction to access the V-Safe system.   Mr. Breeden was instructed to call 911 with any severe reactions post vaccine: Marland Kitchen Difficulty breathing  . Swelling of face and throat  . A fast heartbeat  . A bad rash all over body  . Dizziness and weakness

## 2020-09-30 ENCOUNTER — Other Ambulatory Visit: Payer: Self-pay | Admitting: Internal Medicine

## 2020-10-10 ENCOUNTER — Encounter: Payer: Self-pay | Admitting: Internal Medicine

## 2020-10-10 ENCOUNTER — Ambulatory Visit (INDEPENDENT_AMBULATORY_CARE_PROVIDER_SITE_OTHER): Payer: Medicare Other | Admitting: Internal Medicine

## 2020-10-10 ENCOUNTER — Other Ambulatory Visit: Payer: Self-pay

## 2020-10-10 VITALS — BP 128/76 | HR 64 | Temp 98.2°F | Resp 16 | Ht 71.0 in | Wt 236.0 lb

## 2020-10-10 DIAGNOSIS — E785 Hyperlipidemia, unspecified: Secondary | ICD-10-CM

## 2020-10-10 DIAGNOSIS — Z23 Encounter for immunization: Secondary | ICD-10-CM | POA: Diagnosis not present

## 2020-10-10 DIAGNOSIS — N1831 Chronic kidney disease, stage 3a: Secondary | ICD-10-CM | POA: Diagnosis not present

## 2020-10-10 DIAGNOSIS — E118 Type 2 diabetes mellitus with unspecified complications: Secondary | ICD-10-CM | POA: Diagnosis not present

## 2020-10-10 DIAGNOSIS — M1000 Idiopathic gout, unspecified site: Secondary | ICD-10-CM

## 2020-10-10 DIAGNOSIS — I10 Essential (primary) hypertension: Secondary | ICD-10-CM | POA: Diagnosis not present

## 2020-10-10 LAB — CBC WITH DIFFERENTIAL/PLATELET
Basophils Absolute: 0.1 10*3/uL (ref 0.0–0.1)
Basophils Relative: 1.1 % (ref 0.0–3.0)
Eosinophils Absolute: 0.3 10*3/uL (ref 0.0–0.7)
Eosinophils Relative: 4.3 % (ref 0.0–5.0)
HCT: 47.7 % (ref 39.0–52.0)
Hemoglobin: 15.3 g/dL (ref 13.0–17.0)
Lymphocytes Relative: 25.3 % (ref 12.0–46.0)
Lymphs Abs: 2.1 10*3/uL (ref 0.7–4.0)
MCHC: 32.2 g/dL (ref 30.0–36.0)
MCV: 88.1 fl (ref 78.0–100.0)
Monocytes Absolute: 0.7 10*3/uL (ref 0.1–1.0)
Monocytes Relative: 8.4 % (ref 3.0–12.0)
Neutro Abs: 4.9 10*3/uL (ref 1.4–7.7)
Neutrophils Relative %: 60.9 % (ref 43.0–77.0)
Platelets: 207 10*3/uL (ref 150.0–400.0)
RBC: 5.41 Mil/uL (ref 4.22–5.81)
RDW: 15 % (ref 11.5–15.5)
WBC: 8.1 10*3/uL (ref 4.0–10.5)

## 2020-10-10 LAB — LIPID PANEL
Cholesterol: 134 mg/dL (ref 0–200)
HDL: 37.8 mg/dL — ABNORMAL LOW (ref >39.00)
LDL Cholesterol: 63 mg/dL (ref 0–99)
NonHDL: 96.41
Total CHOL/HDL Ratio: 4
Triglycerides: 166 mg/dL — ABNORMAL HIGH (ref 0.0–149.0)
VLDL: 33.2 mg/dL (ref 0.0–40.0)

## 2020-10-10 LAB — HEPATIC FUNCTION PANEL
ALT: 11 U/L (ref 0–53)
AST: 15 U/L (ref 0–37)
Albumin: 4.1 g/dL (ref 3.5–5.2)
Alkaline Phosphatase: 78 U/L (ref 39–117)
Bilirubin, Direct: 0.1 mg/dL (ref 0.0–0.3)
Total Bilirubin: 0.6 mg/dL (ref 0.2–1.2)
Total Protein: 6.9 g/dL (ref 6.0–8.3)

## 2020-10-10 LAB — POCT GLYCOSYLATED HEMOGLOBIN (HGB A1C): Hemoglobin A1C: 7.6 % — AB (ref 4.0–5.6)

## 2020-10-10 LAB — MICROALBUMIN / CREATININE URINE RATIO
Creatinine,U: 61.4 mg/dL
Microalb Creat Ratio: 4.8 mg/g (ref 0.0–30.0)
Microalb, Ur: 3 mg/dL — ABNORMAL HIGH (ref 0.0–1.9)

## 2020-10-10 LAB — BASIC METABOLIC PANEL
BUN: 28 mg/dL — ABNORMAL HIGH (ref 6–23)
CO2: 32 mEq/L (ref 19–32)
Calcium: 9.2 mg/dL (ref 8.4–10.5)
Chloride: 100 mEq/L (ref 96–112)
Creatinine, Ser: 1.6 mg/dL — ABNORMAL HIGH (ref 0.40–1.50)
GFR: 40.13 mL/min — ABNORMAL LOW (ref >60.00)
Glucose, Bld: 180 mg/dL — ABNORMAL HIGH (ref 70–99)
Potassium: 3.9 mEq/L (ref 3.5–5.1)
Sodium: 137 mEq/L (ref 135–145)

## 2020-10-10 LAB — URINALYSIS, ROUTINE W REFLEX MICROSCOPIC
Bilirubin Urine: NEGATIVE
Hgb urine dipstick: NEGATIVE
Ketones, ur: NEGATIVE
Leukocytes,Ua: NEGATIVE
Nitrite: NEGATIVE
Specific Gravity, Urine: 1.015 (ref 1.000–1.030)
Total Protein, Urine: NEGATIVE
Urine Glucose: >=1000 — AB
Urobilinogen, UA: 0.2 (ref 0.0–1.0)
pH: 6 (ref 5.0–8.0)

## 2020-10-10 LAB — URIC ACID: Uric Acid, Serum: 5.5 mg/dL (ref 4.0–7.8)

## 2020-10-10 MED ORDER — FREESTYLE LIBRE 2 READER DEVI: 1.0000 | Freq: Every day | 5 refills | Status: DC

## 2020-10-10 MED ORDER — GVOKE HYPOPEN 2-PACK 1 MG/0.2ML ~~LOC~~ SOAJ: 1.0000 | Freq: Every day | SUBCUTANEOUS | 5 refills | Status: DC | PRN

## 2020-10-10 MED ORDER — SOLIQUA 100-33 UNT-MCG/ML ~~LOC~~ SOPN: 20.0000 [IU] | PEN_INJECTOR | Freq: Every day | SUBCUTANEOUS | 0 refills | Status: DC

## 2020-10-10 MED ORDER — RYBELSUS 3 MG PO TABS: 1.0000 | ORAL_TABLET | Freq: Every day | ORAL | 0 refills | Status: DC

## 2020-10-10 MED ORDER — FREESTYLE LIBRE 2 SENSOR MISC: 1.0000 | Freq: Every day | 5 refills | Status: DC

## 2020-10-10 NOTE — Progress Notes (Signed)
Subjective:  Patient ID: Ryan Duke, male    DOB: [birth year]  Age: 79 y.o. MRN: [id number]  CC: Diabetes  This visit occurred during the SARS-CoV-2 public health emergency.  Safety protocols were in place, including screening questions prior to the visit, additional usage of staff PPE, and extensive cleaning of exam room while observing appropriate contact time as indicated for disinfecting solutions.    HPI Ryan Duke presents for f/up - He complains that his appetite is too good and has not been able to lose weight.  His blood sugars are usually in the 150-170 range.  He has rare episodes of low blood sugar that respond well to carbohydrates.  He denies polys.  He is active and denies any recent episodes of chest pain, shortness of breath, palpitations, edema, or fatigue.  Outpatient Medications Prior to Visit  Medication Sig Dispense Refill  . atenolol-chlorthalidone (TENORETIC) 100-25 MG tablet TAKE 1 TABLET ONCE DAILY. 90 tablet 0  . Blood Glucose Monitoring Suppl (ACCU-CHEK GUIDE ME) w/Device KIT 1 Act by Does not apply route 3 (three) times daily after meals. 2 kit 0  . Cholecalciferol 50 MCG (2000 UT) TABS Take 1 tablet (2,000 Units total) by mouth daily. 90 tablet 1  . DULoxetine (CYMBALTA) 60 MG capsule TAKE (1) CAPSULE DAILY. 90 capsule 0  . empagliflozin (JARDIANCE) 25 MG TABS tablet Take 1 tablet (25 mg total) by mouth daily before breakfast. 42 tablet 0  . glucose blood (ACCU-CHEK GUIDE) test strip 1 each by Other route in the morning and at bedtime. Use as instructed 50 each 6  . Insulin Pen Needle (NOVOFINE) 32G X 6 MM MISC 1 Act by Does not apply route daily. 100 each 1  . losartan (COZAAR) 100 MG tablet TAKE 1 TABLET DAILY FOR HYPERTENSION. 90 tablet 0  . Multiple Vitamin (MULTIVITAMIN WITH MINERALS) TABS tablet Take 1 tablet by mouth 2 (two) times a week.     . Multiple Vitamins-Minerals (PRESERVISION AREDS PO) Take by mouth.    Marland Kitchen MYRBETRIQ 50 MG TB24 tablet TAKE  1 TABLET ONCE DAILY. 90 tablet 1  . pravastatin (PRAVACHOL) 40 MG tablet TAKE 1 TABLET ONCE DAILY. 90 tablet 0  . pregabalin (LYRICA) 150 MG capsule Take 150 mg by mouth 3 (three) times daily.    . tamsulosin (FLOMAX) 0.4 MG CAPS capsule TAKE (1) CAPSULE DAILY. 90 capsule 1  . vitamin B-12 (CYANOCOBALAMIN) 500 MCG tablet Take 500 mcg by mouth daily.    . insulin glargine, 2 Unit Dial, (TOUJEO MAX SOLOSTAR) 300 UNIT/ML Solostar Pen Inject 20 Units into the skin daily. 9 mL 0  . JANUVIA 100 MG tablet TAKE 1 TABLET ONCE DAILY. 90 tablet 1  . meclizine (ANTIVERT) 12.5 MG tablet Take 2 tablets (25 mg total) by mouth 3 (three) times daily as needed for dizziness. 45 tablet 1   No facility-administered medications prior to visit.    ROS Review of Systems  Constitutional: Negative for chills, diaphoresis, fatigue and unexpected weight change.  HENT: Negative.   Eyes: Negative for visual disturbance.  Respiratory: Negative for cough, chest tightness, shortness of breath and wheezing.   Cardiovascular: Negative for chest pain, palpitations and leg swelling.  Gastrointestinal: Negative for abdominal pain, constipation, diarrhea, nausea and vomiting.  Endocrine: Negative.  Negative for polydipsia, polyphagia and polyuria.  Genitourinary: Negative.  Negative for difficulty urinating.  Musculoskeletal: Negative for arthralgias and myalgias.  Skin: Negative.  Negative for color change and pallor.  Neurological: Negative.  Negative for dizziness, weakness, light-headedness and headaches.  Hematological: Negative for adenopathy. Does not bruise/bleed easily.  Psychiatric/Behavioral: Negative.     Objective:  BP 128/76   Pulse 64   Temp 98.2 F (36.8 C) (Oral)   Resp 16   Ht 5' 11"  (1.803 m)   Wt 236 lb (107 kg)   SpO2 96%   BMI 32.92 kg/m   BP Readings from Last 3 Encounters:  10/10/20 128/76  06/04/20 (!) 146/70  04/12/20 (!) 148/86    Wt Readings from Last 3 Encounters:  10/10/20 236  lb (107 kg)  06/04/20 242 lb (109.8 kg)  04/12/20 243 lb (110.2 kg)    Physical Exam Vitals reviewed.  Constitutional:      Appearance: He is obese.  HENT:     Nose: Nose normal.     Mouth/Throat:     Mouth: Mucous membranes are moist.  Eyes:     General: No scleral icterus.    Conjunctiva/sclera: Conjunctivae normal.  Cardiovascular:     Rate and Rhythm: Normal rate and regular rhythm.     Heart sounds: Murmur heard.  Systolic murmur is present with a grade of 2/6.  No diastolic murmur is present.  No gallop.   Pulmonary:     Effort: Pulmonary effort is normal.     Breath sounds: No stridor. No wheezing, rhonchi or rales.  Abdominal:     General: Abdomen is protuberant. Bowel sounds are normal. There is no distension.     Palpations: Abdomen is soft. There is no hepatomegaly, splenomegaly or mass.     Tenderness: There is no abdominal tenderness.  Musculoskeletal:        General: Normal range of motion.     Cervical back: Neck supple.     Right lower leg: No edema.  Lymphadenopathy:     Cervical: No cervical adenopathy.  Skin:    General: Skin is warm.  Neurological:     General: No focal deficit present.     Mental Status: He is alert.  Psychiatric:        Mood and Affect: Mood normal.        Behavior: Behavior normal.     Lab Results  Component Value Date   WBC 8.1 10/10/2020   HGB 15.3 10/10/2020   HCT 47.7 10/10/2020   PLT 207.0 10/10/2020   GLUCOSE 180 (H) 10/10/2020   CHOL 134 10/10/2020   TRIG 166.0 (H) 10/10/2020   HDL 37.80 (L) 10/10/2020   LDLDIRECT 81.0 05/26/2018   LDLCALC 63 10/10/2020   ALT 11 10/10/2020   AST 15 10/10/2020   NA 137 10/10/2020   K 3.9 10/10/2020   CL 100 10/10/2020   CREATININE 1.60 (H) 10/10/2020   BUN 28 (H) 10/10/2020   CO2 32 10/10/2020   TSH 3.61 12/04/2019   PSA 1.05 10/28/2015   INR 1.04 09/14/2017   HGBA1C 7.6 (A) 10/10/2020   MICROALBUR 3.0 (H) 10/10/2020    MR LUMBAR SPINE WO CONTRAST  Result Date:  06/29/2019 CLINICAL DATA:  Vertebral compression fractures status post kyphoplasty. Persistent severe back pain with right-sided radiculopathy EXAM: MRI LUMBAR SPINE WITHOUT CONTRAST TECHNIQUE: Multiplanar, multisequence MR imaging of the lumbar spine was performed. No intravenous contrast was administered. COMPARISON:  MRI 04/19/2019 FINDINGS: Segmentation:  Standard. Alignment:  Unchanged. 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 Interval progression in the degree of vertebral body height loss of a subacute L4  vertebral body compression fracture, now with greater than 50% height loss. Mild bony retropulsion the superior aspect of the L4 vertebral body. There is persistent marrow edema within the vertebral body and extending into the pedicles bilaterally. Low T1/T2 signal centrally within the vertebral body suggesting cement augmentation. No new fracture. Discogenic marrow changes throughout the remaining lumbar levels. Conus medullaris and cauda equina: Conus extends to the T12-L1 level. Conus and cauda equina appear normal. Paraspinal and other soft tissues: The previously seen bilateral psoas muscle fluid collections have resolved. Remaining paravertebral soft tissues are within normal limits. Disc levels: T12-L1: Mild diffuse disc bulge with left greater than right facet arthrosis. No foraminal or canal stenosis. Unchanged. L1-L2: Mild diffuse disc bulge with bilateral facet arthrosis results in mild canal stenosis without foraminal narrowing. Unchanged. L2-L3: Bony retropulsion from chronic L2 compression fracture which results in moderate canal stenosis and moderate to severe stenosis of the lateral recesses and neural foramen bilaterally. Unchanged. L3-L4: Mild bony retropulsion of the L4 vertebral body, progressed from prior. Mild diffuse disc bulge and bilateral facet arthrosis resulting in moderate canal  stenosis with moderate stenosis of the lateral recesses and neural foramen bilaterally. The degree of canal stenosis has slightly progressed secondary to the L4 vertebral retropulsion. L4-L5: No significant disc protrusion. Severe bilateral facet arthrosis results in severe narrowing of the bilateral neural foramen and moderate narrowing of the canal and bilateral subarticular recesses. Unchanged. L5-S1: Bilateral facet arthropathy without foraminal or canal stenosis. Unchanged. IMPRESSION: 1. Interval progression of L4 vertebral body compression fracture, now with greater than 50% vertebral body height loss. There is also slight increased bony retropulsion this level resulting in moderate canal stenosis, slightly progressed from prior. 2. Low signal changes within the L4 vertebral body, favored to represent interval cement augmentation. Correlate with interval procedural history. 3. Interval resolution of previously seen bilateral psoas muscle fluid collections, likely resolved intramuscular hematomas. 4. 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 5. Multilevel lumbar spondylosis at the remaining vertebral levels including moderate canal stenosis at L2-3 and L4-5, not significantly progressed compared to the prior MRI 04/18/2019. Electronically Signed   By: Davina Poke M.D.   On: 06/29/2019 10:09    Assessment & Plan:   Ryan Duke was seen today for diabetes.  Diagnoses and all orders for this visit:  Flu vaccine need -     Flu Vaccine QUAD High Dose(Fluad)  Type II diabetes mellitus with manifestations ([place])- His A1c has improved down to 7.6%.  He has, however, not yet achieved his goal of less than 7.5%.  I recommend that he transition from a DPP 4 inhibitor to a GLP-1 agonist.  This will also help him reduce his caloric intake. -     HM Diabetes Foot Exam -     Discontinue: Glucagon (GVOKE HYPOPEN 2-PACK) 1 MG/0.2ML SOAJ; Inject 1 Act into the skin daily as  needed. -     POCT glycosylated hemoglobin (Hb A1C) -     Discontinue: Semaglutide (RYBELSUS) 3 MG TABS; Take 1 tablet by mouth daily. -     Basic metabolic panel; Future -     Microalbumin / creatinine urine ratio; Future -     Urinalysis, Routine w reflex microscopic; Future -     Continuous Blood Gluc Sensor (FREESTYLE LIBRE 2 SENSOR) MISC; 1 Act by Does not apply route daily. -     Continuous Blood Gluc Receiver (FREESTYLE LIBRE 2 READER) DEVI; 1 Act by Does not apply route  daily. -     Insulin Glargine-Lixisenatide (SOLIQUA) 100-33 UNT-MCG/ML SOPN; Inject 20 Units into the skin daily. -     Urinalysis, Routine w reflex microscopic -     Microalbumin / creatinine urine ratio -     Basic metabolic panel -     glucagon (GLUCAGEN HYPOKIT) 1 MG SOLR injection; Inject 1 mg into the vein once as needed for up to 1 dose for low blood sugar.  Essential hypertension- His BP is adequately well controlled. -     CBC with Differential/Platelet; Future -     Basic metabolic panel; Future -     Microalbumin / creatinine urine ratio; Future -     Urinalysis, Routine w reflex microscopic; Future -     Urinalysis, Routine w reflex microscopic -     Microalbumin / creatinine urine ratio -     Basic metabolic panel -     CBC with Differential/Platelet  Stage 3a chronic kidney disease ([place])- His renal function is stable.  His blood pressure is adequately well controlled.  Will try to get better control of his blood sugars.  He will avoid nephrotoxic agents. -     Basic metabolic panel; Future -     Microalbumin / creatinine urine ratio; Future -     Urinalysis, Routine w reflex microscopic; Future -     Urinalysis, Routine w reflex microscopic -     Microalbumin / creatinine urine ratio -     Basic metabolic panel  Hyperlipidemia with target LDL less than 100- He is doing well on the statin. -     Lipid panel; Future -     Hepatic function panel; Future -     Hepatic function panel -     Lipid  panel  Idiopathic gout, unspecified chronicity, unspecified site- His uric acid level is normal and he has had no episodes of gout.  -     Uric acid; Future -     Uric acid   I have discontinued Ryan Mead. Duke's meclizine, Toujeo Max SoloStar, Januvia, Gvoke HypoPen 2-Pack, and Rybelsus. I am also having him start on [employer], [employer] Reader, [place], and [employer] HypoKit. Additionally, I am having him maintain his multivitamin with minerals, Cholecalciferol, Multiple Vitamins-Minerals (PRESERVISION AREDS PO), vitamin B-12, pregabalin, NovoFine, Accu-Chek Guide Me, empagliflozin, Accu-Chek Guide, Myrbetriq, tamsulosin, atenolol-chlorthalidone, DULoxetine, pravastatin, and losartan.  Meds ordered this encounter  Medications  . DISCONTD: Glucagon (GVOKE HYPOPEN 2-PACK) 1 MG/0.2ML SOAJ    Sig: Inject 1 Act into the skin daily as needed.    Dispense:  2 mL    Refill:  5  . DISCONTD: Semaglutide (RYBELSUS) 3 MG TABS    Sig: Take 1 tablet by mouth daily.    Dispense:  30 tablet    Refill:  0  . Continuous Blood Gluc Sensor (FREESTYLE LIBRE 2 SENSOR) MISC    Sig: 1 Act by Does not apply route daily.    Dispense:  2 each    Refill:  5  . Continuous Blood Gluc Receiver (FREESTYLE LIBRE 2 READER) DEVI    Sig: 1 Act by Does not apply route daily.    Dispense:  2 each    Refill:  5  . Insulin Glargine-Lixisenatide (SOLIQUA) 100-33 UNT-MCG/ML SOPN    Sig: Inject 20 Units into the skin daily.    Dispense:  9 mL    Refill:  0  . glucagon (GLUCAGEN HYPOKIT) 1 MG SOLR injection  Sig: Inject 1 mg into the vein once as needed for up to 1 dose for low blood sugar.    Dispense:  2 each    Refill:  5     Follow-up: Return in about 3 months (around 01/10/2021).  [name], MD

## 2020-10-10 NOTE — Patient Instructions (Signed)
Type 2 Diabetes Mellitus, Diagnosis, Adult Type 2 diabetes (type 2 diabetes mellitus) is a long-term (chronic) disease. In type 2 diabetes, one or both of these problems may be present:  The pancreas does not make enough of a hormone called insulin.  Cells in the body do not respond properly to insulin that the body makes (insulin resistance). Normally, insulin allows blood sugar (glucose) to enter cells in the body. The cells use glucose for energy. Insulin resistance or lack of insulin causes excess glucose to build up in the blood instead of going into cells. As a result, high blood glucose (hyperglycemia) develops. What increases the risk? The following factors may make you more likely to develop type 2 diabetes:  Having a family member with type 2 diabetes.  Being overweight or obese.  Having an inactive (sedentary) lifestyle.  Having been diagnosed with insulin resistance.  Having a history of prediabetes, gestational diabetes, or polycystic ovary syndrome (PCOS).  Being of American-Indian, African-American, Hispanic/Latino, or Asian/Pacific Islander descent. What are the signs or symptoms? In the early stage of this condition, you may not have symptoms. Symptoms develop slowly and may include:  Increased thirst (polydipsia).  Increased hunger(polyphagia).  Increased urination (polyuria).  Increased urination during the night (nocturia).  Unexplained weight loss.  Frequent infections that keep coming back (recurring).  Fatigue.  Weakness.  Vision changes, such as blurry vision.  Cuts or bruises that are slow to heal.  Tingling or numbness in the hands or feet.  Dark patches on the skin (acanthosis nigricans). How is this diagnosed? This condition is diagnosed based on your symptoms, your medical history, a physical exam, and your blood glucose level. Your blood glucose may be checked with one or more of the following blood tests:  A fasting blood glucose (FBG)  test. You will not be allowed to eat (you will fast) for 8 hours or longer before a blood sample is taken.  A random blood glucose test. This test checks blood glucose at any time of day regardless of when you ate.  An A1c (hemoglobin A1c) blood test. This test provides information about blood glucose control over the previous 2-3 months.  An oral glucose tolerance test (OGTT). This test measures your blood glucose at two times: ? After fasting. This is your baseline blood glucose level. ? Two hours after drinking a beverage that contains glucose. You may be diagnosed with type 2 diabetes if:  Your FBG level is 126 mg/dL (7.0 mmol/L) or higher.  Your random blood glucose level is 200 mg/dL (11.1 mmol/L) or higher.  Your A1c level is 6.5% or higher.  Your OGTT result is higher than 200 mg/dL (11.1 mmol/L). These blood tests may be repeated to confirm your diagnosis. How is this treated? Your treatment may be managed by a specialist called an endocrinologist. Type 2 diabetes may be treated by following instructions from your health care provider about:  Making diet and lifestyle changes. This may include: ? Following an individualized nutrition plan that is developed by a diet and nutrition specialist (registered dietitian). ? Exercising regularly. ? Finding ways to manage stress.  Checking your blood glucose level as often as told.  Taking diabetes medicines or insulin daily. This helps to keep your blood glucose levels in the healthy range. ? If you use insulin, you may need to adjust the dosage depending on how physically active you are and what foods you eat. Your health care provider will tell you how to adjust your dosage.    Taking medicines to help prevent complications from diabetes, such as: ? Aspirin. ? Medicine to lower cholesterol. ? Medicine to control blood pressure. Your health care provider will set individualized treatment goals for you. Your goals will be based on  your age, other medical conditions you have, and how you respond to diabetes treatment. Generally, the goal of treatment is to maintain the following blood glucose levels:  Before meals (preprandial): 80-130 mg/dL (4.4-7.2 mmol/L).  After meals (postprandial): below 180 mg/dL (10 mmol/L).  A1c level: less than 7%. Follow these instructions at home: Questions to ask your health care provider  Consider asking the following questions: ? Do I need to meet with a diabetes educator? ? Where can I find a support group for people with diabetes? ? What equipment will I need to manage my diabetes at home? ? What diabetes medicines do I need, and when should I take them? ? How often do I need to check my blood glucose? ? What number can I call if I have questions? ? When is my next appointment? General instructions  Take over-the-counter and prescription medicines only as told by your health care provider.  Keep all follow-up visits as told by your health care provider. This is important.  For more information about diabetes, visit: ? American Diabetes Association (ADA): www.diabetes.org ? American Association of Diabetes Educators (AADE): www.diabeteseducator.org Contact a health care provider if:  Your blood glucose is at or above 240 mg/dL (13.3 mmol/L) for 2 days in a row.  You have been sick or have had a fever for 2 days or longer, and you are not getting better.  You have any of the following problems for more than 6 hours: ? You cannot eat or drink. ? You have nausea and vomiting. ? You have diarrhea. Get help right away if:  Your blood glucose is lower than 54 mg/dL (3.0 mmol/L).  You become confused or you have trouble thinking clearly.  You have difficulty breathing.  You have moderate or large ketone levels in your urine. Summary  Type 2 diabetes (type 2 diabetes mellitus) is a long-term (chronic) disease. In type 2 diabetes, the pancreas does not make enough of a  hormone called insulin, or cells in the body do not respond properly to insulin that the body makes (insulin resistance).  This condition is treated by making diet and lifestyle changes and taking diabetes medicines or insulin.  Your health care provider will set individualized treatment goals for you. Your goals will be based on your age, other medical conditions you have, and how you respond to diabetes treatment.  Keep all follow-up visits as told by your health care provider. This is important. This information is not intended to replace advice given to you by your health care provider. Make sure you discuss any questions you have with your health care provider. Document Revised: 01/14/2018 Document Reviewed: 12/20/2015 Elsevier Patient Education  2020 Elsevier Inc.  

## 2020-10-13 MED ORDER — GLUCAGEN HYPOKIT 1 MG IJ SOLR: 1.0000 mg | Freq: Once | INTRAMUSCULAR | 5 refills | Status: DC | PRN

## 2020-10-14 ENCOUNTER — Other Ambulatory Visit: Payer: Self-pay

## 2020-10-14 ENCOUNTER — Encounter: Payer: Self-pay | Admitting: Cardiovascular Disease

## 2020-10-14 ENCOUNTER — Ambulatory Visit (HOSPITAL_COMMUNITY): Payer: Medicare Other | Attending: Cardiology

## 2020-10-14 ENCOUNTER — Ambulatory Visit: Payer: Medicare Other | Admitting: Cardiovascular Disease

## 2020-10-14 VITALS — BP 104/58 | HR 67 | Ht 71.0 in | Wt 236.0 lb

## 2020-10-14 DIAGNOSIS — I350 Nonrheumatic aortic (valve) stenosis: Secondary | ICD-10-CM | POA: Diagnosis not present

## 2020-10-14 DIAGNOSIS — I10 Essential (primary) hypertension: Secondary | ICD-10-CM

## 2020-10-14 DIAGNOSIS — E782 Mixed hyperlipidemia: Secondary | ICD-10-CM

## 2020-10-14 LAB — ECHOCARDIOGRAM COMPLETE
AR max vel: 0.93 cm2
AV Area VTI: 0.94 cm2
AV Area mean vel: 1 cm2
AV Mean grad: 40 mmHg
AV Peak grad: 61.8 mmHg
Ao pk vel: 3.93 m/s
Area-P 1/2: 2.16 cm2
S' Lateral: 2.8 cm

## 2020-10-14 MED ORDER — PERFLUTREN LIPID MICROSPHERE
1.0000 mL | INTRAVENOUS | Status: AC | PRN
  Administered 2020-10-14: 1 mL via INTRAVENOUS

## 2020-10-14 NOTE — Patient Instructions (Signed)
Medication Instructions:  °Your provider recommends that you continue on your current medications as directed. Please refer to the Current Medication list given to you today.   °*If you need a refill on your cardiac medications before your next appointment, please call your pharmacy* ° ° °Follow-Up: °Your provider recommends that you schedule a follow-up appointment in 6 months with Dr. Cooper. °

## 2020-10-14 NOTE — Progress Notes (Signed)
Cardiology Office Note:    Date:  10/14/2020   ID:  Ryan Duke, DOB September 19, 1941, MRN [id number]  PCP:  Janith Lima, MD  [facility] Cardiologist:  Sherren Mocha, MD  [place] Electrophysiologist:  None   Referring MD: Janith Lima, MD   Chief Complaint  Patient presents with  . Aortic Stenosis    History of Present Illness:    Ryan Duke is a 79 y.o. male with a hx of moderate aortic stenosis, presenting for follow-up evaluation.  The patient is here alone today.  He just had a follow-up echocardiogram performed this morning.  I have reviewed the images but the formal interpretation is currently pending.  LV function is vigorous.  Peak and mean gradients are variable and the highest gradients ICR a mean of 41 mmHg and a peak of 62 mmHg.  Most of the gradients are in the range of a mean in the low 30s.  The patient is actually feeling well and has had some improvement in his shortness of breath since he has become more active.  He is now regularly taking out the trash and doing some other physical activities with less limitation.  He denies shortness of breath, chest pain, chest pressure, lightheadedness, heart palpitations, leg swelling, or frank syncope.  He has had some problems with numbness in his legs.  He is ambulating with a cane.  Past Medical History:  Diagnosis Date  . Diabetes mellitus    type 2  . Hyperlipidemia   . Hypertension   . Hypogonadism, male   . Pancreatitis 2008  . Renal insufficiency     Past Surgical History:  Procedure Laterality Date  . EYE SURGERY     cataracts  . FLEXIBLE SIGMOIDOSCOPY N/A 09/10/2017   Procedure: FLEXIBLE SIGMOIDOSCOPY;  Surgeon: Mauri Pole, MD;  Location: [facility];  Service: Endoscopy;  Laterality: N/A;  . IR KYPHO LUMBAR INC FX REDUCE BONE BX UNI/BIL CANNULATION INC/IMAGING  09/14/2017  . IR RADIOLOGIST EVAL & MGMT  10/07/2017    Current Medications: Current Meds  Medication Sig  .  atenolol-chlorthalidone (TENORETIC) 100-25 MG tablet TAKE 1 TABLET ONCE DAILY.  Marland Kitchen Blood Glucose Monitoring Suppl (ACCU-CHEK GUIDE ME) w/Device KIT 1 Act by Does not apply route 3 (three) times daily after meals.  . Cholecalciferol 50 MCG (2000 UT) TABS Take 1 tablet (2,000 Units total) by mouth daily.  . Continuous Blood Gluc Receiver (FREESTYLE LIBRE 2 READER) DEVI 1 Act by Does not apply route daily.  . DULoxetine (CYMBALTA) 60 MG capsule TAKE (1) CAPSULE DAILY.  Marland Kitchen empagliflozin (JARDIANCE) 25 MG TABS tablet Take 1 tablet (25 mg total) by mouth daily before breakfast.  . glucose blood (ACCU-CHEK GUIDE) test strip 1 each by Other route in the morning and at bedtime. Use as instructed  . Insulin Glargine-Lixisenatide (SOLIQUA) 100-33 UNT-MCG/ML SOPN Inject 20 Units into the skin daily.  . Insulin Pen Needle (NOVOFINE) 32G X 6 MM MISC 1 Act by Does not apply route daily.  Marland Kitchen losartan (COZAAR) 100 MG tablet TAKE 1 TABLET DAILY FOR HYPERTENSION.  . Multiple Vitamin (MULTIVITAMIN WITH MINERALS) TABS tablet Take 1 tablet by mouth 2 (two) times a week.   . Multiple Vitamins-Minerals (PRESERVISION AREDS PO) Take by mouth.  Marland Kitchen MYRBETRIQ 50 MG TB24 tablet TAKE 1 TABLET ONCE DAILY.  . pravastatin (PRAVACHOL) 40 MG tablet TAKE 1 TABLET ONCE DAILY.  Marland Kitchen pregabalin (LYRICA) 150 MG capsule Take 150 mg by mouth 3 (three) times  daily.  . tamsulosin (FLOMAX) 0.4 MG CAPS capsule TAKE (1) CAPSULE DAILY.  . vitamin B-12 (CYANOCOBALAMIN) 500 MCG tablet Take 500 mcg by mouth daily.  . [DISCONTINUED] Continuous Blood Gluc Sensor (FREESTYLE LIBRE 2 SENSOR) MISC 1 Act by Does not apply route daily.  . [DISCONTINUED] glucagon (GLUCAGEN HYPOKIT) 1 MG SOLR injection Inject 1 mg into the vein once as needed for up to 1 dose for low blood sugar.     Allergies:   Fentanyl, Farxiga [dapagliflozin], Metformin and related, Atorvastatin, Benazepril hcl, Flexeril [cyclobenzaprine], and Oxycodone   Social History   Socioeconomic  History  . Marital status: Married    Spouse name: Not on file  . Number of children: Not on file  . Years of education: Not on file  . Highest education level: Not on file  Occupational History  . Occupation: retired  Tobacco Use  . Smoking status: Former Smoker    Quit date: 11/30/1974    Years since quitting: 45.9  . Smokeless tobacco: Never Used  Vaping Use  . Vaping Use: Never used  Substance and Sexual Activity  . Alcohol use: Not Currently    Alcohol/week: 0.0 standard drinks  . Drug use: No  . Sexual activity: Yes  Other Topics Concern  . Not on file  Social History Narrative   Regular exercise-yes   Social Determinants of Health   Financial Resource Strain: Low Risk   . Difficulty of Paying Living Expenses: Not hard at all  Food Insecurity:   . Worried About Charity fundraiser in the Last Year: Not on file  . Ran Out of Food in the Last Year: Not on file  Transportation Needs:   . Lack of Transportation (Medical): Not on file  . Lack of Transportation (Non-Medical): Not on file  Physical Activity:   . Days of Exercise per Week: Not on file  . Minutes of Exercise per Session: Not on file  Stress:   . Feeling of Stress : Not on file  Social Connections:   . Frequency of Communication with Friends and Family: Not on file  . Frequency of Social Gatherings with Friends and Family: Not on file  . Attends Religious Services: Not on file  . Active Member of Clubs or Organizations: Not on file  . Attends Archivist Meetings: Not on file  . Marital Status: Not on file     Family History: The patient's family history includes Hypertension in an other family member.  ROS:   Please see the history of present illness.    All other systems reviewed and are negative.  EKGs/Labs/Other Studies Reviewed:    The following studies were reviewed today: 2D echo images personally reviewed as outlined above.  The aortic valve appears to be trileaflet with  thickening and calcification of all leaflets noted.  There is moderate to severe leaflet restriction.  Mean transvalvular gradients ranged from 30 to 41 mmHg.  EKG:  EKG is not ordered today.    Recent Labs: 12/04/2019: TSH 3.61 10/10/2020: ALT 11; BUN 28; Creatinine, Ser 1.60; Hemoglobin 15.3; Platelets 207.0; Potassium 3.9; Sodium 137  Recent Lipid Panel    Component Value Date/Time   CHOL 134 10/10/2020 1128   TRIG 166.0 (H) 10/10/2020 1128   TRIG 171 (H) 10/06/2006 0741   HDL 37.80 (L) 10/10/2020 1128   CHOLHDL 4 10/10/2020 1128   VLDL 33.2 10/10/2020 1128   LDLCALC 63 10/10/2020 1128   LDLDIRECT 81.0 05/26/2018 1547  Risk Assessment/Calculations:       Physical Exam:    VS:  BP (!) 104/58   Pulse 67   Ht 5' 11"  (1.803 m)   Wt 236 lb (107 kg)   SpO2 100%   BMI 32.92 kg/m     Wt Readings from Last 3 Encounters:  10/14/20 236 lb (107 kg)  10/10/20 236 lb (107 kg)  06/04/20 242 lb (109.8 kg)     GEN:  Well nourished, well developed in no acute distress HEENT: Normal NECK: No JVD; No carotid bruits LYMPHATICS: No lymphadenopathy CARDIAC: RRR, 3/6 harsh mid to late peaking systolic murmur, A2 present RESPIRATORY:  Clear to auscultation without rales, wheezing or rhonchi  ABDOMEN: Soft, non-tender, non-distended MUSCULOSKELETAL:  No edema; No deformity  SKIN: Warm and dry NEUROLOGIC:  Alert and oriented x 3 PSYCHIATRIC:  Normal affect   ASSESSMENT:    1. Nonrheumatic aortic valve stenosis   2. Essential hypertension   3. Mixed hyperlipidemia    PLAN:    In order of problems listed above:  1. The patient has stage C1, moderately severe aortic stenosis.  He is certainly showing signs of slow progression of aortic stenosis based on his surveillance echo studies.  In 2019, peak and mean transvalvular gradients were 65 and 34 mm, respectively.  The dimensionless index on today's study is 0.32.  He is less symptomatic since he has become a bit more active now  with some improvement in mild shortness of breath.  I have recommended continued close surveillance in the absence of any worsening symptoms.  I will see him back in 6 months for follow-up evaluation.  I would plan on a repeat echocardiogram next year.  He understands to call if he experiences any symptoms of progressive aortic stenosis.  I discussed the potential of worsening shortness of breath, chest discomfort, lightheadedness, or increasing fatigue. 2. Well-controlled on current medical therapy.  Most recent labs reviewed with creatinine of 1.49 and potassium of 3.9. 3. Treated with a statin drug.  LDL cholesterol 85 mg/dL.    Shared Decision Making/Informed Consent        Medication Adjustments/Labs and Tests Ordered: Current medicines are reviewed at length with the patient today.  Concerns regarding medicines are outlined above.  No orders of the defined types were placed in this encounter.  No orders of the defined types were placed in this encounter.   Patient Instructions  Medication Instructions:  Your provider recommends that you continue on your current medications as directed. Please refer to the Current Medication list given to you today.   *If you need a refill on your cardiac medications before your next appointment, please call your pharmacy*   Follow-Up: Your provider recommends that you schedule a follow-up appointment in 6 months with Dr. Burt Knack.    Signed, Sherren Mocha, MD  10/14/2020 1:26 PM    [facility]

## 2020-10-17 ENCOUNTER — Other Ambulatory Visit: Payer: Self-pay | Admitting: Internal Medicine

## 2020-10-31 DIAGNOSIS — M48062 Spinal stenosis, lumbar region with neurogenic claudication: Secondary | ICD-10-CM | POA: Diagnosis not present

## 2020-10-31 DIAGNOSIS — M4726 Other spondylosis with radiculopathy, lumbar region: Secondary | ICD-10-CM | POA: Diagnosis not present

## 2020-11-13 ENCOUNTER — Telehealth: Payer: Self-pay | Admitting: Pharmacist

## 2020-11-13 NOTE — Progress Notes (Signed)
Chronic Care Management Pharmacy Assistant   Name: Ryan Duke  MRN: [id number] DOB: 1941-02-23  Reason for Encounter: General Adherence Call   PCP : Janith Lima, MD  Allergies:   Allergies  Allergen Reactions  . Fentanyl     Other reaction(s): Mental Status Changes (intolerance) patch  . Wilder Glade [Dapagliflozin] Other (See Comments)    gout  . Metformin And Related Other (See Comments)    acidosis  . Atorvastatin Other (See Comments)    REACTION: h/a  . Benazepril Hcl Other (See Comments)    Was not effective  . Flexeril [Cyclobenzaprine] Other (See Comments)    HALLUSINATIONS  . Oxycodone Other (See Comments)    HALLUSINATIONS    Medications: Outpatient Encounter Medications as of 11/13/2020  Medication Sig  . atenolol-chlorthalidone (TENORETIC) 100-25 MG tablet TAKE 1 TABLET ONCE DAILY.  Marland Kitchen Blood Glucose Monitoring Suppl (ACCU-CHEK GUIDE ME) w/Device KIT 1 Act by Does not apply route 3 (three) times daily after meals.  . Cholecalciferol 50 MCG (2000 UT) TABS Take 1 tablet (2,000 Units total) by mouth daily.  . Continuous Blood Gluc Receiver (FREESTYLE LIBRE 2 READER) DEVI 1 Act by Does not apply route daily.  . DULoxetine (CYMBALTA) 60 MG capsule TAKE (1) CAPSULE DAILY.  Marland Kitchen empagliflozin (JARDIANCE) 25 MG TABS tablet Take 1 tablet (25 mg total) by mouth daily before breakfast.  . glucose blood (ACCU-CHEK GUIDE) test strip 1 each by Other route in the morning and at bedtime. Use as instructed  . Insulin Glargine-Lixisenatide (SOLIQUA) 100-33 UNT-MCG/ML SOPN Inject 20 Units into the skin daily.  . Insulin Pen Needle (NOVOFINE) 32G X 6 MM MISC 1 Act by Does not apply route daily.  Marland Kitchen losartan (COZAAR) 100 MG tablet TAKE 1 TABLET DAILY FOR HYPERTENSION.  . Multiple Vitamin (MULTIVITAMIN WITH MINERALS) TABS tablet Take 1 tablet by mouth 2 (two) times a week.   . Multiple Vitamins-Minerals (PRESERVISION AREDS PO) Take by mouth.  Marland Kitchen MYRBETRIQ 50 MG TB24 tablet TAKE 1  TABLET ONCE DAILY.  . pravastatin (PRAVACHOL) 40 MG tablet TAKE 1 TABLET ONCE DAILY.  Marland Kitchen pregabalin (LYRICA) 150 MG capsule Take 150 mg by mouth 3 (three) times daily.  . tamsulosin (FLOMAX) 0.4 MG CAPS capsule TAKE (1) CAPSULE DAILY.  . vitamin B-12 (CYANOCOBALAMIN) 500 MCG tablet Take 500 mcg by mouth daily.   No facility-administered encounter medications on file as of 11/13/2020.    Current Diagnosis: Patient Active Problem List   Diagnosis Date Noted  . Onychomycosis of toenail 12/04/2019  . OAB (overactive bladder) 06/08/2019  . Vitamin D deficiency 02/15/2018  . Bilateral low back pain with sciatica 11/01/2017  . Constipation   . Mild intermittent asthma without complication 08/65/7846  . Primary gout 07/07/2016  . Depression 09/15/2015  . Routine general medical examination at a health care facility 09/18/2011  . GERD 07/22/2010  . BPH (benign prostatic hyperplasia) 09/02/2009  . Kidney disease, chronic, 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) ([place]) 02/26/2009  . Aortic valve disorder 02/25/2009  . PERIPHERAL VASCULAR DISEASE 02/25/2009  . Type II diabetes mellitus with manifestations ([place]) 11/01/2007  . Hyperlipidemia with target LDL less than 100 07/01/2007  . Morbid obesity ([place]) 07/01/2007  . Essential hypertension 07/01/2007    Goals Addressed   None     Follow-Up:  Pharmacist Review   A general adherence call was made to Ryan Duke to see how he has been doing since his last visit with clinical pharmacist Mendel Ryder. The patint states  that he is doing well and is not having any issues or concerns about his health at this time. He is doing well on all of his medications and not having any trouble. The patient states that he does not follow a special diet and that when he can he will get out and exercise. But over all the patient states that there are no new issues with him. I let the patient know that I would pass along the information to the clinical pharmacist Mendel Ryder.  Wendy Poet, Clinical Pharmacist Assistant [facility]

## 2021-01-13 DIAGNOSIS — Z85828 Personal history of other malignant neoplasm of skin: Secondary | ICD-10-CM | POA: Diagnosis not present

## 2021-01-13 DIAGNOSIS — L570 Actinic keratosis: Secondary | ICD-10-CM | POA: Diagnosis not present

## 2021-01-13 DIAGNOSIS — C4442 Squamous cell carcinoma of skin of scalp and neck: Secondary | ICD-10-CM | POA: Diagnosis not present

## 2021-01-13 DIAGNOSIS — D485 Neoplasm of uncertain behavior of skin: Secondary | ICD-10-CM | POA: Diagnosis not present

## 2021-01-13 DIAGNOSIS — L821 Other seborrheic keratosis: Secondary | ICD-10-CM | POA: Diagnosis not present

## 2021-01-13 DIAGNOSIS — L905 Scar conditions and fibrosis of skin: Secondary | ICD-10-CM | POA: Diagnosis not present

## 2021-01-14 ENCOUNTER — Ambulatory Visit: Payer: Medicare Other | Admitting: Internal Medicine

## 2021-01-15 DIAGNOSIS — H26493 Other secondary cataract, bilateral: Secondary | ICD-10-CM | POA: Diagnosis not present

## 2021-01-15 DIAGNOSIS — Z961 Presence of intraocular lens: Secondary | ICD-10-CM | POA: Diagnosis not present

## 2021-01-15 DIAGNOSIS — H3530 Unspecified macular degeneration: Secondary | ICD-10-CM | POA: Diagnosis not present

## 2021-01-15 DIAGNOSIS — H18513 Endothelial corneal dystrophy, bilateral: Secondary | ICD-10-CM | POA: Diagnosis not present

## 2021-01-20 ENCOUNTER — Ambulatory Visit: Payer: Medicare Other | Admitting: Internal Medicine

## 2021-01-22 ENCOUNTER — Telehealth: Payer: Self-pay | Admitting: Internal Medicine

## 2021-01-22 NOTE — Telephone Encounter (Signed)
Error

## 2021-01-27 ENCOUNTER — Ambulatory Visit (INDEPENDENT_AMBULATORY_CARE_PROVIDER_SITE_OTHER): Payer: Medicare Other | Admitting: Internal Medicine

## 2021-01-27 ENCOUNTER — Encounter: Payer: Self-pay | Admitting: Internal Medicine

## 2021-01-27 ENCOUNTER — Other Ambulatory Visit: Payer: Self-pay

## 2021-01-27 VITALS — BP 138/82 | Temp 98.0°F | Ht 71.0 in | Wt 235.0 lb

## 2021-01-27 DIAGNOSIS — Z0000 Encounter for general adult medical examination without abnormal findings: Secondary | ICD-10-CM

## 2021-01-27 DIAGNOSIS — E118 Type 2 diabetes mellitus with unspecified complications: Secondary | ICD-10-CM

## 2021-01-27 DIAGNOSIS — I10 Essential (primary) hypertension: Secondary | ICD-10-CM | POA: Diagnosis not present

## 2021-01-27 LAB — POCT GLYCOSYLATED HEMOGLOBIN (HGB A1C): Hemoglobin A1C: 8 % — AB (ref 4.0–5.6)

## 2021-01-27 MED ORDER — GLUCAGEN HYPOKIT 1 MG IJ SOLR: 1.0000 mg | Freq: Once | INTRAMUSCULAR | 2 refills | Status: DC | PRN

## 2021-01-27 NOTE — Patient Instructions (Signed)
Increase the soliqua dose by 2 units every 5 days to lower the blood sugar to <150  The maximum dose is 60 units per day

## 2021-01-27 NOTE — Progress Notes (Signed)
Subjective:  Patient ID: Ryan Duke, male    DOB: [birth year]  Age: 80 y.o. MRN: [id number]  CC: Annual Exam and Diabetes  This visit occurred during the SARS-CoV-2 public health emergency.  Safety protocols were in place, including screening questions prior to the visit, additional usage of staff PPE, and extensive cleaning of exam room while observing appropriate contact time as indicated for disinfecting solutions.    HPI Ryan Duke presents for a CPX.  He is concerned that his blood sugar is not adequately well controlled.  His fasting blood sugars are 150-200 and his random and postprandial blood sugars are usually above 200.  He has not made much progress with his lifestyle modifications.  He is using [place] but has stayed at the 20 unit dosage.  He denies polys.  Outpatient Medications Prior to Visit  Medication Sig Dispense Refill  . atenolol-chlorthalidone (TENORETIC) 100-25 MG tablet TAKE 1 TABLET ONCE DAILY. 90 tablet 0  . Blood Glucose Monitoring Suppl (ACCU-CHEK GUIDE ME) w/Device KIT 1 Act by Does not apply route 3 (three) times daily after meals. 2 kit 0  . Cholecalciferol 50 MCG (2000 UT) TABS Take 1 tablet (2,000 Units total) by mouth daily. 90 tablet 1  . Continuous Blood Gluc Receiver (FREESTYLE LIBRE 2 READER) DEVI 1 Act by Does not apply route daily. 2 each 5  . DULoxetine (CYMBALTA) 60 MG capsule TAKE (1) CAPSULE DAILY. 90 capsule 0  . empagliflozin (JARDIANCE) 25 MG TABS tablet Take 1 tablet (25 mg total) by mouth daily before breakfast. 42 tablet 0  . glucose blood (ACCU-CHEK GUIDE) test strip 1 each by Other route in the morning and at bedtime. Use as instructed 50 each 6  . Insulin Glargine-Lixisenatide (SOLIQUA) 100-33 UNT-MCG/ML SOPN Inject 20 Units into the skin daily. 9 mL 0  . Insulin Pen Needle (NOVOFINE) 32G X 6 MM MISC 1 Act by Does not apply route daily. 100 each 1  . losartan (COZAAR) 100 MG tablet TAKE 1 TABLET DAILY FOR HYPERTENSION. 90 tablet  0  . Multiple Vitamin (MULTIVITAMIN WITH MINERALS) TABS tablet Take 1 tablet by mouth 2 (two) times a week.     . Multiple Vitamins-Minerals (PRESERVISION AREDS PO) Take by mouth.    Marland Kitchen MYRBETRIQ 50 MG TB24 tablet TAKE 1 TABLET ONCE DAILY. 90 tablet 1  . pravastatin (PRAVACHOL) 40 MG tablet TAKE 1 TABLET ONCE DAILY. 90 tablet 0  . pregabalin (LYRICA) 150 MG capsule Take 150 mg by mouth 3 (three) times daily.    . tamsulosin (FLOMAX) 0.4 MG CAPS capsule TAKE (1) CAPSULE DAILY. 90 capsule 1  . vitamin B-12 (CYANOCOBALAMIN) 500 MCG tablet Take 500 mcg by mouth daily.     No facility-administered medications prior to visit.    ROS Review of Systems  Constitutional: Negative.  Negative for appetite change, diaphoresis, fatigue and unexpected weight change.  HENT: Negative.   Eyes: Negative for visual disturbance.  Respiratory: Negative for cough, chest tightness, shortness of breath and wheezing.   Cardiovascular: Negative for chest pain, palpitations and leg swelling.  Gastrointestinal: Negative for abdominal pain, constipation, diarrhea, nausea and vomiting.  Endocrine: Negative.  Negative for polydipsia, polyphagia and polyuria.  Genitourinary: Negative.  Negative for difficulty urinating.  Musculoskeletal: Negative for arthralgias, back pain and myalgias.  Skin: Negative.  Negative for color change and pallor.  Neurological: Negative.  Negative for dizziness and weakness.  Hematological: Negative for adenopathy. Does not bruise/bleed easily.  Psychiatric/Behavioral: Negative.  Objective:  BP 138/82   Temp 98 F (36.7 C) (Oral)   Ht 5' 11"  (1.803 m)   Wt 235 lb (106.6 kg)   BMI 32.78 kg/m   BP Readings from Last 3 Encounters:  01/27/21 138/82  10/14/20 (!) 104/58  10/10/20 128/76    Wt Readings from Last 3 Encounters:  01/27/21 235 lb (106.6 kg)  10/14/20 236 lb (107 kg)  10/10/20 236 lb (107 kg)    Physical Exam Vitals reviewed.  HENT:     Nose: Nose normal.      Mouth/Throat:     Mouth: Mucous membranes are moist.  Eyes:     General: No scleral icterus.    Conjunctiva/sclera: Conjunctivae normal.  Cardiovascular:     Rate and Rhythm: Normal rate.     Heart sounds: Murmur heard.   Systolic murmur is present with a grade of 2/6.  No diastolic murmur is present. No gallop.   Pulmonary:     Effort: Pulmonary effort is normal.     Breath sounds: No stridor. No wheezing, rhonchi or rales.  Abdominal:     General: Abdomen is protuberant. Bowel sounds are normal. There is no distension.     Palpations: Abdomen is soft. There is no hepatomegaly, splenomegaly or mass.     Tenderness: There is no abdominal tenderness.  Musculoskeletal:     Cervical back: Neck supple.     Right lower leg: No edema.     Left lower leg: No edema.  Lymphadenopathy:     Cervical: No cervical adenopathy.  Skin:    General: Skin is warm and dry.     Findings: No rash.  Neurological:     General: No focal deficit present.     Mental Status: He is alert and oriented to person, place, and time. Mental status is at baseline.     Lab Results  Component Value Date   WBC 8.1 10/10/2020   HGB 15.3 10/10/2020   HCT 47.7 10/10/2020   PLT 207.0 10/10/2020   GLUCOSE 180 (H) 10/10/2020   CHOL 134 10/10/2020   TRIG 166.0 (H) 10/10/2020   HDL 37.80 (L) 10/10/2020   LDLDIRECT 81.0 05/26/2018   LDLCALC 63 10/10/2020   ALT 11 10/10/2020   AST 15 10/10/2020   NA 137 10/10/2020   K 3.9 10/10/2020   CL 100 10/10/2020   CREATININE 1.60 (H) 10/10/2020   BUN 28 (H) 10/10/2020   CO2 32 10/10/2020   TSH 3.61 12/04/2019   PSA 1.05 10/28/2015   INR 1.04 09/14/2017   HGBA1C 8.0 (A) 01/27/2021   MICROALBUR 3.0 (H) 10/10/2020    MR LUMBAR SPINE WO CONTRAST  Result Date: 06/29/2019 CLINICAL DATA:  Vertebral compression fractures status post kyphoplasty. Persistent severe back pain with right-sided radiculopathy EXAM: MRI LUMBAR SPINE WITHOUT CONTRAST TECHNIQUE: Multiplanar,  multisequence MR imaging of the lumbar spine was performed. No intravenous contrast was administered. COMPARISON:  MRI 04/19/2019 FINDINGS: Segmentation:  Standard. Alignment:  Unchanged. 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 Interval progression in the degree of vertebral body height loss of a subacute L4 vertebral body compression fracture, now with greater than 50% height loss. Mild bony retropulsion the superior aspect of the L4 vertebral body. There is persistent marrow edema within the vertebral body and extending into the pedicles bilaterally. Low T1/T2 signal centrally within the vertebral body suggesting cement augmentation. No new fracture. Discogenic marrow changes throughout the  remaining lumbar levels. Conus medullaris and cauda equina: Conus extends to the T12-L1 level. Conus and cauda equina appear normal. Paraspinal and other soft tissues: The previously seen bilateral psoas muscle fluid collections have resolved. Remaining paravertebral soft tissues are within normal limits. Disc levels: T12-L1: Mild diffuse disc bulge with left greater than right facet arthrosis. No foraminal or canal stenosis. Unchanged. L1-L2: Mild diffuse disc bulge with bilateral facet arthrosis results in mild canal stenosis without foraminal narrowing. Unchanged. L2-L3: Bony retropulsion from chronic L2 compression fracture which results in moderate canal stenosis and moderate to severe stenosis of the lateral recesses and neural foramen bilaterally. Unchanged. L3-L4: Mild bony retropulsion of the L4 vertebral body, progressed from prior. Mild diffuse disc bulge and bilateral facet arthrosis resulting in moderate canal stenosis with moderate stenosis of the lateral recesses and neural foramen bilaterally. The degree of canal stenosis has slightly progressed secondary to the L4 vertebral retropulsion. L4-L5: No significant disc  protrusion. Severe bilateral facet arthrosis results in severe narrowing of the bilateral neural foramen and moderate narrowing of the canal and bilateral subarticular recesses. Unchanged. L5-S1: Bilateral facet arthropathy without foraminal or canal stenosis. Unchanged. IMPRESSION: 1. Interval progression of L4 vertebral body compression fracture, now with greater than 50% vertebral body height loss. There is also slight increased bony retropulsion this level resulting in moderate canal stenosis, slightly progressed from prior. 2. Low signal changes within the L4 vertebral body, favored to represent interval cement augmentation. Correlate with interval procedural history. 3. Interval resolution of previously seen bilateral psoas muscle fluid collections, likely resolved intramuscular hematomas. 4. 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 5. Multilevel lumbar spondylosis at the remaining vertebral levels including moderate canal stenosis at L2-3 and L4-5, not significantly progressed compared to the prior MRI 04/18/2019. Electronically Signed   By: Davina Poke M.D.   On: 06/29/2019 10:09    Assessment & Plan:   Anmol was seen today for annual exam and diabetes.  Diagnoses and all orders for this visit:  Type II diabetes mellitus with manifestations ([place])- His A1c remains too high at 8.0%.  I recommended that he increase the dose of Soliqua by 2 units every 5 days to try to maintain a blood sugar consistently below 200. -     POCT glycosylated hemoglobin (Hb A1C) -     glucagon (GLUCAGEN HYPOKIT) 1 MG SOLR injection; Inject 1 mg into the vein once as needed for up to 1 dose for low blood sugar.  Essential hypertension- His blood pressure is adequately well controlled.  Routine general medical examination at a health care facility- Exam completed, labs reviewed, vaccines reviewed, no cancer screenings are indicated, patient education was given.   I am  having [name]. Hallmon start on GlucaGen HypoKit. I am also having him maintain his multivitamin with minerals, Cholecalciferol, Multiple Vitamins-Minerals (PRESERVISION AREDS PO), vitamin B-12, pregabalin, NovoFine, Accu-Chek Guide Me, empagliflozin, Accu-Chek Guide, Myrbetriq, tamsulosin, atenolol-chlorthalidone, DULoxetine, pravastatin, losartan, FreeStyle Libre 2 Reader, and [place].  Meds ordered this encounter  Medications  . glucagon (GLUCAGEN HYPOKIT) 1 MG SOLR injection    Sig: Inject 1 mg into the vein once as needed for up to 1 dose for low blood sugar.    Dispense:  2 each    Refill:  2     Follow-up: Return in about 3 months (around 04/26/2021).  [name], MD

## 2021-02-10 ENCOUNTER — Other Ambulatory Visit: Payer: Self-pay | Admitting: Internal Medicine

## 2021-02-18 DIAGNOSIS — D044 Carcinoma in situ of skin of scalp and neck: Secondary | ICD-10-CM | POA: Diagnosis not present

## 2021-02-18 DIAGNOSIS — L570 Actinic keratosis: Secondary | ICD-10-CM | POA: Diagnosis not present

## 2021-03-05 LAB — HM DIABETES EYE EXAM

## 2021-03-11 ENCOUNTER — Other Ambulatory Visit: Payer: Self-pay | Admitting: Internal Medicine

## 2021-03-25 ENCOUNTER — Other Ambulatory Visit: Payer: Self-pay | Admitting: Internal Medicine

## 2021-03-25 ENCOUNTER — Telehealth: Payer: Self-pay

## 2021-03-25 ENCOUNTER — Telehealth: Payer: Self-pay | Admitting: Pharmacist

## 2021-03-25 NOTE — Progress Notes (Addendum)
Chronic Care Management Pharmacy Assistant   Name: ALLON COSTLOW  MRN: [id number] DOB: 1941/05/18   Reason for Encounter: Diabetic Disease State Call   Conditions to be addressed/monitored: DMII   Recent office visits:  01/27/21 Dr. Ronnald Ramp ordered Glucagon HCI 1 mg intravenous prn  Recent consult visits:  None ID  Hospital visits:  None in previous 6 months  Medications: Outpatient Encounter Medications as of 03/25/2021  Medication Sig   atenolol-chlorthalidone (TENORETIC) 100-25 MG tablet TAKE 1 TABLET ONCE DAILY.   Blood Glucose Monitoring Suppl (ACCU-CHEK GUIDE ME) w/Device KIT 1 Act by Does not apply route 3 (three) times daily after meals.   Cholecalciferol 50 MCG (2000 UT) TABS Take 1 tablet (2,000 Units total) by mouth daily.   Continuous Blood Gluc Receiver (FREESTYLE LIBRE 2 READER) DEVI 1 Act by Does not apply route daily.   Continuous Blood Gluc Sensor (FREESTYLE LIBRE 2 SENSOR) MISC USE AS DIRECTED AND REPLACE EVERY 14 DAYS.   DULoxetine (CYMBALTA) 60 MG capsule TAKE (1) CAPSULE DAILY.   empagliflozin (JARDIANCE) 25 MG TABS tablet Take 1 tablet (25 mg total) by mouth daily before breakfast.   glucagon (GLUCAGEN HYPOKIT) 1 MG SOLR injection Inject 1 mg into the vein once as needed for up to 1 dose for low blood sugar.   glucose blood (ACCU-CHEK GUIDE) test strip 1 each by Other route in the morning and at bedtime. Use as instructed   Insulin Glargine-Lixisenatide (SOLIQUA) 100-33 UNT-MCG/ML SOPN Inject 20 Units into the skin daily.   Insulin Pen Needle (NOVOFINE) 32G X 6 MM MISC 1 Act by Does not apply route daily.   losartan (COZAAR) 100 MG tablet TAKE 1 TABLET DAILY FOR HYPERTENSION.   Multiple Vitamin (MULTIVITAMIN WITH MINERALS) TABS tablet Take 1 tablet by mouth 2 (two) times a week.    Multiple Vitamins-Minerals (PRESERVISION AREDS PO) Take by mouth.   MYRBETRIQ 50 MG TB24 tablet TAKE 1 TABLET ONCE DAILY.   pravastatin (PRAVACHOL) 40 MG tablet TAKE 1 TABLET  ONCE DAILY.   pregabalin (LYRICA) 150 MG capsule Take 150 mg by mouth 3 (three) times daily.   tamsulosin (FLOMAX) 0.4 MG CAPS capsule TAKE (1) CAPSULE DAILY.   vitamin B-12 (CYANOCOBALAMIN) 500 MCG tablet Take 500 mcg by mouth daily.   No facility-administered encounter medications on file as of 03/25/2021.   Recent Relevant Labs: Lab Results  Component Value Date/Time   HGBA1C 8.0 (A) 01/27/2021 11:36 AM   HGBA1C 7.6 (A) 10/10/2020 10:56 AM   HGBA1C 7.9 (H) 11/17/2018 08:36 AM   HGBA1C 7.1 (H) 05/26/2018 03:47 PM   HGBA1C 5.8 06/05/2016 12:00 AM   MICROALBUR 3.0 (H) 10/10/2020 11:28 AM   MICROALBUR 26.5 (H) 12/04/2019 03:33 PM    Kidney Function Lab Results  Component Value Date/Time   CREATININE 1.60 (H) 10/10/2020 11:28 AM   CREATININE 1.49 06/04/2020 11:45 AM   GFR 40.13 (L) 10/10/2020 11:28 AM   GFRNONAA 51 (L) 01/27/2019 03:02 PM   GFRAA 60 (L) 01/27/2019 03:02 PM    Current antihyperglycemic regimen: Soliqua 20 units, patient states he is taking 30 units  What recent interventions/DTPs have been made to improve glycemic control: Dr. Ronnald Ramp recommended patient increase soliqua by 2 units every 5 days to maintain blood sugar  Have there been any recent hospitalizations or ED visits since last visit with CPP?None ID  Patient denies hypoglycemic symptoms Patient reports that he has had some readings over 200 but has not had any hyperglycemic symptoms  How often are you checking your blood sugar?Patient states that he checks blood sugar when he wakes up in the morning   What are your blood sugars ranging? 178-200 in the morning Fasting: 178 this morning Before meals:  After meals:  Bedtime:  During the week, how often does your blood glucose drop below 70?        Patient states no readings below 70  Are you checking your feet daily/regularly? Patient states no issue with his feet  Adherence Review: Is the patient currently on a STATIN medication? Pravastatin Is the  patient currently on ACE/ARB medication? Losartan Does the patient have >5 day gap between last estimated fill dates? No   Wendy Poet  Star Rating Drugs: Losartan 02/08/21 90 ds Pravastatin 02/10/21 90 ds  [place] Pharmacist Assistant [phone]

## 2021-03-25 NOTE — Progress Notes (Signed)
error 

## 2021-03-31 ENCOUNTER — Telehealth: Payer: Self-pay | Admitting: Internal Medicine

## 2021-03-31 ENCOUNTER — Other Ambulatory Visit: Payer: Self-pay | Admitting: Internal Medicine

## 2021-03-31 ENCOUNTER — Telehealth: Payer: Self-pay | Admitting: Pharmacist

## 2021-03-31 DIAGNOSIS — E118 Type 2 diabetes mellitus with unspecified complications: Secondary | ICD-10-CM

## 2021-03-31 MED ORDER — SOLIQUA 100-33 UNT-MCG/ML ~~LOC~~ SOPN: 20.0000 [IU] | PEN_INJECTOR | Freq: Every day | SUBCUTANEOUS | 1 refills | Status: DC

## 2021-03-31 NOTE — Telephone Encounter (Signed)
RX sent

## 2021-03-31 NOTE — Telephone Encounter (Signed)
Patient called and was wondering if Dr. Ronnald Ramp had any samples for [facility]. He said that he is out. Please advise

## 2021-03-31 NOTE — Telephone Encounter (Signed)
LVM stating a refill was sent

## 2021-03-31 NOTE — Telephone Encounter (Signed)
Pt called, states he can come pick up soliqua Medication Samples have been provided to the patient.  Also pt given [employer] PAP application to complete & leave with [place].  Pt aware.  Drug name: Willeen Niece   Strength: 100/33  Qty: 2     LOT: [id number]  Exp.Date: 06/29/22  Dosing instructions: Inject 20 units into the skin daily.  The patient has been instructed regarding the correct time, dose, and frequency of taking this medication, including desired effects and most common side effects.   Lucinda Dell 12:39 PM 03/31/2021

## 2021-03-31 NOTE — Telephone Encounter (Signed)
Patient has [employer] and reports copay for Willeen Niece is cost prohibitive at this time.  Reviewed application process for [employer] patient assistance program. Patient meets income/out of pocket spend criteria for the program. Patient will provide proof of income, out of pocket spend report, and will sign application. Will collaborate with prescriber Dr Ronnald Ramp for the provider portion of application. Once completed, application will be submitted via Fax   Patient assistance program Fax number: [phone]   In the meantime patient is completely out of medication and requests samples. Per log there is ample supply of Soliqua.  Request 2 Soliqua pens (~20 day supply) while we await PAP approval. Sending to Licensed conveyancer for samples.

## 2021-04-03 ENCOUNTER — Telehealth: Payer: Self-pay

## 2021-04-03 NOTE — Telephone Encounter (Signed)
Key: [id number]

## 2021-04-03 NOTE — Telephone Encounter (Signed)
Approvedtoday Effective from 04/03/2021 through 04/03/2022.

## 2021-04-07 ENCOUNTER — Ambulatory Visit: Payer: Medicare Other | Admitting: Cardiovascular Disease

## 2021-04-07 ENCOUNTER — Encounter: Payer: Self-pay | Admitting: Cardiovascular Disease

## 2021-04-07 ENCOUNTER — Other Ambulatory Visit: Payer: Self-pay

## 2021-04-07 VITALS — BP 142/90 | HR 54 | Ht 69.0 in

## 2021-04-07 DIAGNOSIS — E782 Mixed hyperlipidemia: Secondary | ICD-10-CM | POA: Diagnosis not present

## 2021-04-07 DIAGNOSIS — I10 Essential (primary) hypertension: Secondary | ICD-10-CM | POA: Diagnosis not present

## 2021-04-07 DIAGNOSIS — I350 Nonrheumatic aortic (valve) stenosis: Secondary | ICD-10-CM

## 2021-04-07 NOTE — Patient Instructions (Signed)
Medication Instructions:  Your provider recommends that you continue on your current medications as directed. Please refer to the Current Medication list given to you today.   *If you need a refill on your cardiac medications before your next appointment, please call your pharmacy*   Follow-Up: You will be called to arrange your 6 month echo and office visit with Dr. Cooper. 

## 2021-04-07 NOTE — Progress Notes (Signed)
Cardiology Office Note:    Date:  04/07/2021   ID:  DEVERE Duke, DOB Jan 09, 1941, MRN [id number]  PCP:  Janith Lima, MD   [facility] Providers Cardiologist:  Sherren Mocha, MD     Referring MD: Janith Lima, MD   Chief Complaint  Patient presents with  . Aortic Stenosis    History of Present Illness:    Ryan Duke is a 80 y.o. male with a hx of aortic stenosis, presenting for follow-up evaluation today.  He was last seen in November 2021.  At the time of his last echocardiogram, he was noted to have peak and mean transaortic gradients of 62 and 41 mmHg, respectively.  The dimensionless index was 0.32.  He was felt to have moderately severe aortic stenosis and presents today for follow-up evaluation.  The patient is here alone today. He is doing well. States that he has been more active over the past several months because his wife has developed back problems and cannot go up and down the steps. He is doing more activity and denies shortness of breath with that level of activity. He denies chest or pressure. He denies lightheadedness or syncope. He denies leg swelling, orthopnea, or PND.   Past Medical History:  Diagnosis Date  . Diabetes mellitus    type 2  . Hyperlipidemia   . Hypertension   . Hypogonadism, male   . Pancreatitis 2008  . Renal insufficiency     Past Surgical History:  Procedure Laterality Date  . EYE SURGERY     cataracts  . FLEXIBLE SIGMOIDOSCOPY N/A 09/10/2017   Procedure: FLEXIBLE SIGMOIDOSCOPY;  Surgeon: Mauri Pole, MD;  Location: [facility];  Service: Endoscopy;  Laterality: N/A;  . IR KYPHO LUMBAR INC FX REDUCE BONE BX UNI/BIL CANNULATION INC/IMAGING  09/14/2017  . IR RADIOLOGIST EVAL & MGMT  10/07/2017    Current Medications: Current Meds  Medication Sig  . atenolol-chlorthalidone (TENORETIC) 100-25 MG tablet TAKE 1 TABLET ONCE DAILY.  Marland Kitchen Blood Glucose Monitoring Suppl (ACCU-CHEK GUIDE ME) w/Device KIT 1 Act by Does  not apply route 3 (three) times daily after meals.  . Cholecalciferol 50 MCG (2000 UT) TABS Take 1 tablet (2,000 Units total) by mouth daily.  . Continuous Blood Gluc Receiver (FREESTYLE LIBRE 2 READER) DEVI 1 Act by Does not apply route daily.  . Continuous Blood Gluc Sensor (FREESTYLE LIBRE 2 SENSOR) MISC USE AS DIRECTED AND REPLACE EVERY 14 DAYS.  . DULoxetine (CYMBALTA) 60 MG capsule TAKE (1) CAPSULE DAILY.  Marland Kitchen empagliflozin (JARDIANCE) 25 MG TABS tablet Take 1 tablet (25 mg total) by mouth daily before breakfast.  . glucagon (GLUCAGEN HYPOKIT) 1 MG SOLR injection Inject 1 mg into the vein once as needed for up to 1 dose for low blood sugar.  Marland Kitchen glucose blood (ACCU-CHEK GUIDE) test strip 1 each by Other route in the morning and at bedtime. Use as instructed  . Insulin Glargine-Lixisenatide (SOLIQUA) 100-33 UNT-MCG/ML SOPN Inject 20 Units into the skin daily.  . Insulin Pen Needle (NOVOFINE) 32G X 6 MM MISC 1 Act by Does not apply route daily.  Marland Kitchen JANUVIA 100 MG tablet Take 100 mg by mouth daily.  Marland Kitchen losartan (COZAAR) 100 MG tablet TAKE 1 TABLET DAILY FOR HYPERTENSION.  . Multiple Vitamin (MULTIVITAMIN WITH MINERALS) TABS tablet Take 1 tablet by mouth 2 (two) times a week.   . Multiple Vitamins-Minerals (PRESERVISION AREDS PO) Take by mouth.  Marland Kitchen MYRBETRIQ 50 MG TB24 tablet TAKE 1  TABLET ONCE DAILY.  . pravastatin (PRAVACHOL) 40 MG tablet TAKE 1 TABLET ONCE DAILY.  Marland Kitchen pregabalin (LYRICA) 150 MG capsule Take 150 mg by mouth 3 (three) times daily.  . tamsulosin (FLOMAX) 0.4 MG CAPS capsule TAKE (1) CAPSULE DAILY.  . vitamin B-12 (CYANOCOBALAMIN) 500 MCG tablet Take 500 mcg by mouth daily.     Allergies:   Fentanyl, Farxiga [dapagliflozin], Metformin and related, Atorvastatin, Benazepril hcl, Flexeril [cyclobenzaprine], and Oxycodone   Social History   Socioeconomic History  . Marital status: Married    Spouse name: Not on file  . Number of children: Not on file  . Years of education: Not on  file  . Highest education level: Not on file  Occupational History  . Occupation: retired  Tobacco Use  . Smoking status: Former Smoker    Quit date: 11/30/1974    Years since quitting: 46.3  . Smokeless tobacco: Never Used  Vaping Use  . Vaping Use: Never used  Substance and Sexual Activity  . Alcohol use: Not Currently    Alcohol/week: 0.0 standard drinks  . Drug use: No  . Sexual activity: Yes  Other Topics Concern  . Not on file  Social History Narrative   Regular exercise-yes   Social Determinants of Health   Financial Resource Strain: Not on file  Food Insecurity: Not on file  Transportation Needs: Not on file  Physical Activity: Not on file  Stress: Not on file  Social Connections: Not on file     Family History: The patient's family history includes Hypertension in an other family member.  ROS:   Please see the history of present illness.    All other systems reviewed and are negative.  EKGs/Labs/Other Studies Reviewed:    The following studies were reviewed today: Echo:  1. There is a small dynamic intracavitary gradient in the mid LV cavity  and apex due to hyperdynamic LVF.   Marland Kitchen Left ventricular ejection fraction, by estimation, is 65 to 70%. The  left ventricle has normal function. The left ventricle has no regional  wall motion abnormalities. There is mild concentric left ventricular  hypertrophy. Left ventricular diastolic  parameters are consistent with Grade I diastolic dysfunction (impaired  relaxation).  2. Right ventricular systolic function is normal. The right ventricular  size is normal. Tricuspid regurgitation signal is inadequate for assessing  PA pressure.  3. The mitral valve is normal in structure. No evidence of mitral valve  regurgitation. No evidence of mitral stenosis.  4. The aortic valve is calcified. There is severe calcifcation of the  aortic valve. There is severe thickening of the aortic valve. Aortic valve   regurgitation is trivial. Severe aortic valve stenosis. Aortic valve area,  by VTI measures 0.94 cm. Aortic  valve mean gradient measures 40.0 mmHg. Aortic valve Vmax measures 3.93  m/s.  5. Aortic dilatation noted. There is mild dilatation of the aortic root,  measuring 41 mm. There is mild dilatation of the ascending aorta,  measuring 40 mm.  6. The inferior vena cava is normal in size with greater than 50%  respiratory variability, suggesting right atrial pressure of 3 mmHg.   Comparison(s): 02/12/20 EF 60-65%. Moderate AS 73mHg mean PG, 454mG peak  PG. Compared to prior echo, AV stenosis has progressed to moderate to  severe with mean AVG is 4063m, DI 0.27 and AVA 1cm2.   EKG:  EKG is ordered today.  The ekg ordered today demonstrates sinus brady 54 bpm, left axis deviation, first degree  AV block, otherwise normal  Recent Labs: 10/10/2020: ALT 11; BUN 28; Creatinine, Ser 1.60; Hemoglobin 15.3; Platelets 207.0; Potassium 3.9; Sodium 137  Recent Lipid Panel    Component Value Date/Time   CHOL 134 10/10/2020 1128   TRIG 166.0 (H) 10/10/2020 1128   TRIG 171 (H) 10/06/2006 0741   HDL 37.80 (L) 10/10/2020 1128   CHOLHDL 4 10/10/2020 1128   VLDL 33.2 10/10/2020 1128   LDLCALC 63 10/10/2020 1128   LDLDIRECT 81.0 05/26/2018 1547     Risk Assessment/Calculations:       Physical Exam:    VS:  BP (!) 142/90   Pulse (!) 54   Ht 5' 9"  (1.753 m)   SpO2 99%   BMI 34.70 kg/m     Wt Readings from Last 3 Encounters:  01/27/21 235 lb (106.6 kg)  10/14/20 236 lb (107 kg)  10/10/20 236 lb (107 kg)     GEN:  Well nourished, well developed in no acute distress HEENT: Normal NECK: No JVD; No carotid bruits LYMPHATICS: No lymphadenopathy CARDIAC: bradycardic and regular, no murmurs, rubs, gallops RESPIRATORY:  Clear to auscultation without rales, wheezing or rhonchi  ABDOMEN: Soft, non-tender, non-distended MUSCULOSKELETAL:  No edema; No deformity  SKIN: Warm and  dry NEUROLOGIC:  Alert and oriented x 3 PSYCHIATRIC:  Normal affect   ASSESSMENT:    1. Nonrheumatic aortic valve stenosis   2. Essential hypertension   3. Mixed hyperlipidemia    PLAN:    In order of problems listed above:  1. The patient appears clinically stable, demonstrating no symptoms at this time, consistent with severe, stage C aortic stenosis.  He ambulates with a cane and I suspect he does not reach a workload where he has symptoms from aortic stenosis.  I will see him back in 6 months for clinical follow-up with an echocardiogram at that time.  If he has had significant progression of aortic stenosis, will consider treating him with TAVR. 2. He will continue on atenolol/chlorthalidone and losartan. 3. Treated with pravastatin.  LDL cholesterol 63 mg/dL.  Transaminases normal with an ALT of 11.  Overall the patient appears clinically stable with minimal cardiovascular symptoms.  I will plan to see him back in 6 months with an echocardiogram to reassess his aortic stenosis and symptom burden.  Medication Adjustments/Labs and Tests Ordered: Current medicines are reviewed at length with the patient today.  Concerns regarding medicines are outlined above.  Orders Placed This Encounter  Procedures  . EKG 12-Lead  . ECHOCARDIOGRAM COMPLETE   No orders of the defined types were placed in this encounter.   Patient Instructions  Medication Instructions:  Your provider recommends that you continue on your current medications as directed. Please refer to the Current Medication list given to you today.   *If you need a refill on your cardiac medications before your next appointment, please call your pharmacy*  Follow-Up: You will be called to arrange your 6 month echo and office visit with Dr. Burt Knack.    Signed, Sherren Mocha, MD  04/07/2021 5:19 PM    [place]

## 2021-04-21 ENCOUNTER — Other Ambulatory Visit: Payer: Self-pay

## 2021-04-21 ENCOUNTER — Ambulatory Visit (INDEPENDENT_AMBULATORY_CARE_PROVIDER_SITE_OTHER): Payer: Medicare Other | Admitting: Internal Medicine

## 2021-04-21 ENCOUNTER — Encounter: Payer: Self-pay | Admitting: Internal Medicine

## 2021-04-21 VITALS — BP 138/78 | HR 56 | Temp 97.8°F | Resp 16 | Ht 69.0 in | Wt 237.0 lb

## 2021-04-21 DIAGNOSIS — N1831 Chronic kidney disease, stage 3a: Secondary | ICD-10-CM | POA: Diagnosis not present

## 2021-04-21 DIAGNOSIS — Z23 Encounter for immunization: Secondary | ICD-10-CM

## 2021-04-21 DIAGNOSIS — E118 Type 2 diabetes mellitus with unspecified complications: Secondary | ICD-10-CM

## 2021-04-21 DIAGNOSIS — I10 Essential (primary) hypertension: Secondary | ICD-10-CM

## 2021-04-21 LAB — BASIC METABOLIC PANEL
BUN: 30 mg/dL — ABNORMAL HIGH (ref 6–23)
CO2: 31 mEq/L (ref 19–32)
Calcium: 9.2 mg/dL (ref 8.4–10.5)
Chloride: 103 mEq/L (ref 96–112)
Creatinine, Ser: 1.74 mg/dL — ABNORMAL HIGH (ref 0.40–1.50)
GFR: 36.15 mL/min — ABNORMAL LOW (ref >60.00)
Glucose, Bld: 122 mg/dL — ABNORMAL HIGH (ref 70–99)
Potassium: 3.9 mEq/L (ref 3.5–5.1)
Sodium: 141 mEq/L (ref 135–145)

## 2021-04-21 LAB — CBC WITH DIFFERENTIAL/PLATELET
Basophils Absolute: 0.1 10*3/uL (ref 0.0–0.1)
Basophils Relative: 0.9 % (ref 0.0–3.0)
Eosinophils Absolute: 0.4 10*3/uL (ref 0.0–0.7)
Eosinophils Relative: 5.1 % — ABNORMAL HIGH (ref 0.0–5.0)
HCT: 45.4 % (ref 39.0–52.0)
Hemoglobin: 14.8 g/dL (ref 13.0–17.0)
Lymphocytes Relative: 26.7 % (ref 12.0–46.0)
Lymphs Abs: 2.1 10*3/uL (ref 0.7–4.0)
MCHC: 32.6 g/dL (ref 30.0–36.0)
MCV: 87 fl (ref 78.0–100.0)
Monocytes Absolute: 0.7 10*3/uL (ref 0.1–1.0)
Monocytes Relative: 9.2 % (ref 3.0–12.0)
Neutro Abs: 4.5 10*3/uL (ref 1.4–7.7)
Neutrophils Relative %: 58.1 % (ref 43.0–77.0)
Platelets: 199 10*3/uL (ref 150.0–400.0)
RBC: 5.22 Mil/uL (ref 4.22–5.81)
RDW: 15.6 % — ABNORMAL HIGH (ref 11.5–15.5)
WBC: 7.7 10*3/uL (ref 4.0–10.5)

## 2021-04-21 LAB — HEMOGLOBIN A1C: Hgb A1c MFr Bld: 8.3 % — ABNORMAL HIGH (ref 4.6–6.5)

## 2021-04-21 NOTE — Patient Instructions (Signed)
Type 2 Diabetes Mellitus, Diagnosis, Adult Type 2 diabetes (type 2 diabetes mellitus) is a long-term, or chronic, disease. In type 2 diabetes, one or both of these problems may be present:  The pancreas does not make enough of a hormone called insulin.  Cells in the body do not respond properly to insulin that the body makes (insulin resistance). Normally, insulin allows blood sugar (glucose) to enter cells in the body. The cells use glucose for energy. Insulin resistance or lack of insulin causes excess glucose to build up in the blood instead of going into cells. This causes high blood glucose (hyperglycemia).  What are the causes? The exact cause of type 2 diabetes is not known. What increases the risk? The following factors may make you more likely to develop this condition:  Having a family member with type 2 diabetes.  Being overweight or obese.  Being inactive (sedentary).  Having been diagnosed with insulin resistance.  Having a history of prediabetes, diabetes when you were pregnant (gestational diabetes), or polycystic ovary syndrome (PCOS). What are the signs or symptoms? In the early stage of this condition, you may not have symptoms. Symptoms develop slowly and may include:  Increased thirst or hunger.  Increased urination.  Unexplained weight loss.  Tiredness (fatigue) or weakness.  Vision changes, such as blurry vision.  Dark patches on the skin. How is this diagnosed? This condition is diagnosed based on your symptoms, your medical history, a physical exam, and your blood glucose level. Your blood glucose may be checked with one or more of the following blood tests:  A fasting blood glucose (FBG) test. You will not be allowed to eat (you will fast) for 8 hours or longer before a blood sample is taken.  A random blood glucose test. This test checks blood glucose at any time of day regardless of when you ate.  An A1C (hemoglobin A1C) blood test. This test  provides information about blood glucose levels over the previous 2-3 months.  An oral glucose tolerance test (OGTT). This test measures your blood glucose at two times: ? After fasting. This is your baseline blood glucose level. ? Two hours after drinking a beverage that contains glucose. You may be diagnosed with type 2 diabetes if:  Your fasting blood glucose level is 126 mg/dL (7.0 mmol/L) or higher.  Your random blood glucose level is 200 mg/dL (11.1 mmol/L) or higher.  Your A1C level is 6.5% or higher.  Your oral glucose tolerance test result is higher than 200 mg/dL (11.1 mmol/L). These blood tests may be repeated to confirm your diagnosis.   How is this treated? Your treatment may be managed by a specialist called an endocrinologist. Type 2 diabetes may be treated by following instructions from your health care provider about:  Making dietary and lifestyle changes. These may include: ? Following a personalized nutrition plan that is developed by a registered dietitian. ? Exercising regularly. ? Finding ways to manage stress.  Checking your blood glucose level as often as told.  Taking diabetes medicines or insulin daily. This helps to keep your blood glucose levels in the healthy range.  Taking medicines to help prevent complications from diabetes. Medicines may include: ? Aspirin. ? Medicine to lower cholesterol. ? Medicine to control blood pressure. Your health care provider will set treatment goals for you. Your goals will be based on your age, other medical conditions you have, and how you respond to diabetes treatment. Generally, the goal of treatment is to maintain the   following blood glucose levels:  Before meals: 80-130 mg/dL (4.4-7.2 mmol/L).  After meals: below 180 mg/dL (10 mmol/L).  A1C level: less than 7%. Follow these instructions at home: Questions to ask your health care provider Consider asking the following questions:  Should I meet with a certified  diabetes care and education specialist?  What diabetes medicines do I need, and when should I take them?  What equipment will I need to manage my diabetes at home?  How often do I need to check my blood glucose?  Where can I find a support group for people with diabetes?  What number can I call if I have questions?  When is my next appointment? General instructions  Take over-the-counter and prescription medicines only as told by your health care provider.  Keep all follow-up visits as told by your health care provider. This is important. Where to find more information  American Diabetes Association (ADA): www.diabetes.org  American Association of Diabetes Care and Education Specialists (ADCES): www.diabeteseducator.org  International Diabetes Federation (IDF): www.idf.org Contact a health care provider if:  Your blood glucose is at or above 240 mg/dL (13.3 mmol/L) for 2 days in a row.  You have been sick or have had a fever for 2 days or longer, and you are not getting better.  You have any of the following problems for more than 6 hours: ? You cannot eat or drink. ? You have nausea and vomiting. ? You have diarrhea. Get help right away if:  You have severe hypoglycemia. This means your blood glucose is lower than 54 mg/dL (3.0 mmol/L).  You become confused or you have trouble thinking clearly.  You have difficulty breathing.  You have moderate or large ketone levels in your urine. These symptoms may represent a serious problem that is an emergency. Do not wait to see if the symptoms will go away. Get medical help right away. Call your local emergency services (911 in the [place] Do not drive yourself to the hospital. Summary  Type 2 diabetes (type 2 diabetes mellitus) is a long-term, or chronic, disease. In type 2 diabetes, the pancreas does not make enough of a hormone called insulin, or cells in the body do not respond properly to insulin that the body makes (insulin  resistance).  This condition is treated by making dietary and lifestyle changes and taking diabetes medicines or insulin.  Your health care provider will set treatment goals for you. Your goals will be based on your age, other medical conditions you have, and how you respond to diabetes treatment.  Keep all follow-up visits as told by your health care provider. This is important. This information is not intended to replace advice given to you by your health care provider. Make sure you discuss any questions you have with your health care provider. Document Revised: 06/12/2020 Document Reviewed: 06/12/2020 [employer]vier Patient Education  2021 [employer]  

## 2021-04-21 NOTE — Progress Notes (Signed)
Subjective:  Patient ID: Ryan Duke, male    DOB: 05/04/1941  Age: 80 y.o. MRN: [id number]  CC: Diabetes  This visit occurred during the SARS-CoV-2 public health emergency.  Safety protocols were in place, including screening questions prior to the visit, additional usage of staff PPE, and extensive cleaning of exam room while observing appropriate contact time as indicated for disinfecting solutions.    HPI [name] presents for f//up -  He tells me that his blood sugar is well controlled. He denies polys.  Outpatient Medications Prior to Visit  Medication Sig Dispense Refill  . atenolol-chlorthalidone (TENORETIC) 100-25 MG tablet TAKE 1 TABLET ONCE DAILY. 90 tablet 1  . Cholecalciferol 50 MCG (2000 UT) TABS Take 1 tablet (2,000 Units total) by mouth daily. 90 tablet 1  . Continuous Blood Gluc Receiver (FREESTYLE LIBRE 2 READER) DEVI 1 Act by Does not apply route daily. 2 each 5  . Continuous Blood Gluc Sensor (FREESTYLE LIBRE 2 SENSOR) MISC USE AS DIRECTED AND REPLACE EVERY 14 DAYS. 2 each 5  . DULoxetine (CYMBALTA) 60 MG capsule TAKE (1) CAPSULE DAILY. 90 capsule 1  . empagliflozin (JARDIANCE) 25 MG TABS tablet Take 1 tablet (25 mg total) by mouth daily before breakfast. 42 tablet 0  . glucagon (GLUCAGEN HYPOKIT) 1 MG SOLR injection Inject 1 mg into the vein once as needed for up to 1 dose for low blood sugar. 2 each 2  . Insulin Glargine-Lixisenatide (SOLIQUA) 100-33 UNT-MCG/ML SOPN Inject 20 Units into the skin daily. 9 mL 1  . Insulin Pen Needle (NOVOFINE) 32G X 6 MM MISC 1 Act by Does not apply route daily. 100 each 1  . losartan (COZAAR) 100 MG tablet TAKE 1 TABLET DAILY FOR HYPERTENSION. 90 tablet 1  . Multiple Vitamin (MULTIVITAMIN WITH MINERALS) TABS tablet Take 1 tablet by mouth 2 (two) times a week.     . Multiple Vitamins-Minerals (PRESERVISION AREDS PO) Take by mouth.    Marland Kitchen MYRBETRIQ 50 MG TB24 tablet TAKE 1 TABLET ONCE DAILY. 90 tablet 1  . pravastatin  (PRAVACHOL) 40 MG tablet TAKE 1 TABLET ONCE DAILY. 90 tablet 1  . pregabalin (LYRICA) 150 MG capsule Take 150 mg by mouth 3 (three) times daily.    . tamsulosin (FLOMAX) 0.4 MG CAPS capsule TAKE (1) CAPSULE DAILY. 90 capsule 1  . vitamin B-12 (CYANOCOBALAMIN) 500 MCG tablet Take 500 mcg by mouth daily.    . Blood Glucose Monitoring Suppl (ACCU-CHEK GUIDE ME) w/Device KIT 1 Act by Does not apply route 3 (three) times daily after meals. 2 kit 0  . glucose blood (ACCU-CHEK GUIDE) test strip 1 each by Other route in the morning and at bedtime. Use as instructed 50 each 6  . JANUVIA 100 MG tablet Take 100 mg by mouth daily.     No facility-administered medications prior to visit.    ROS Review of Systems  Constitutional: Positive for unexpected weight change (wt gain). Negative for diaphoresis and fatigue.  HENT: Negative.   Eyes: Negative for visual disturbance.  Respiratory: Negative for cough, chest tightness, shortness of breath and wheezing.   Cardiovascular: Negative for chest pain, palpitations and leg swelling.  Gastrointestinal: Negative for abdominal pain, constipation, diarrhea and vomiting.  Endocrine: Negative.  Negative for polydipsia, polyphagia and polyuria.  Genitourinary: Negative.  Negative for difficulty urinating.  Musculoskeletal: Negative for arthralgias, back pain and myalgias.  Skin: Negative.  Negative for color change.  Neurological: Negative.  Negative for dizziness, weakness and  light-headedness.  Hematological: Negative for adenopathy. Does not bruise/bleed easily.  Psychiatric/Behavioral: Negative.     Objective:  BP 138/78 (BP Location: Right Arm, Patient Position: Sitting, Cuff Size: Large)   Pulse (!) 56   Temp 97.8 F (36.6 C) (Oral)   Resp 16   Ht 5' 9"  (1.753 m)   Wt 237 lb (107.5 kg)   SpO2 97%   BMI 35.00 kg/m   BP Readings from Last 3 Encounters:  04/21/21 138/78  04/07/21 (!) 142/90  01/27/21 138/82    Wt Readings from Last 3  Encounters:  04/21/21 237 lb (107.5 kg)  01/27/21 235 lb (106.6 kg)  10/14/20 236 lb (107 kg)    Physical Exam Vitals reviewed.  Constitutional:      Appearance: He is obese.  HENT:     Nose: Nose normal.     Mouth/Throat:     Mouth: Mucous membranes are moist.  Eyes:     General: No scleral icterus.    Conjunctiva/sclera: Conjunctivae normal.  Cardiovascular:     Rate and Rhythm: Regular rhythm. Bradycardia present.     Heart sounds: Murmur heard.   Systolic murmur is present with a grade of 2/6.  No diastolic murmur is present. No gallop.   Pulmonary:     Effort: Pulmonary effort is normal.     Breath sounds: No stridor. No wheezing, rhonchi or rales.  Abdominal:     General: Abdomen is protuberant. Bowel sounds are normal. There is no distension.     Palpations: There is no hepatomegaly, splenomegaly or mass.     Tenderness: There is no abdominal tenderness.  Musculoskeletal:     Cervical back: Neck supple.     Right lower leg: No edema.     Left lower leg: No edema.  Lymphadenopathy:     Cervical: No cervical adenopathy.  Skin:    General: Skin is warm and dry.  Neurological:     General: No focal deficit present.     Mental Status: He is alert.  Psychiatric:        Mood and Affect: Mood normal.        Behavior: Behavior normal.     Lab Results  Component Value Date   WBC 7.7 04/21/2021   HGB 14.8 04/21/2021   HCT 45.4 04/21/2021   PLT 199.0 04/21/2021   GLUCOSE 122 (H) 04/21/2021   CHOL 134 10/10/2020   TRIG 166.0 (H) 10/10/2020   HDL 37.80 (L) 10/10/2020   LDLDIRECT 81.0 05/26/2018   LDLCALC 63 10/10/2020   ALT 11 10/10/2020   AST 15 10/10/2020   NA 141 04/21/2021   K 3.9 04/21/2021   CL 103 04/21/2021   CREATININE 1.74 (H) 04/21/2021   BUN 30 (H) 04/21/2021   CO2 31 04/21/2021   TSH 3.61 12/04/2019   PSA 1.05 10/28/2015   INR 1.04 09/14/2017   HGBA1C 8.3 (H) 04/21/2021   MICROALBUR 3.0 (H) 10/10/2020    MR LUMBAR SPINE WO  CONTRAST  Result Date: 06/29/2019 CLINICAL DATA:  Vertebral compression fractures status post kyphoplasty. Persistent severe back pain with right-sided radiculopathy EXAM: MRI LUMBAR SPINE WITHOUT CONTRAST TECHNIQUE: Multiplanar, multisequence MR imaging of the lumbar spine was performed. No intravenous contrast was administered. COMPARISON:  MRI 04/19/2019 FINDINGS: Segmentation:  Standard. Alignment:  Unchanged. 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 Interval progression in the degree of vertebral body height loss of a subacute L4 vertebral  body compression fracture, now with greater than 50% height loss. Mild bony retropulsion the superior aspect of the L4 vertebral body. There is persistent marrow edema within the vertebral body and extending into the pedicles bilaterally. Low T1/T2 signal centrally within the vertebral body suggesting cement augmentation. No new fracture. Discogenic marrow changes throughout the remaining lumbar levels. Conus medullaris and cauda equina: Conus extends to the T12-L1 level. Conus and cauda equina appear normal. Paraspinal and other soft tissues: The previously seen bilateral psoas muscle fluid collections have resolved. Remaining paravertebral soft tissues are within normal limits. Disc levels: T12-L1: Mild diffuse disc bulge with left greater than right facet arthrosis. No foraminal or canal stenosis. Unchanged. L1-L2: Mild diffuse disc bulge with bilateral facet arthrosis results in mild canal stenosis without foraminal narrowing. Unchanged. L2-L3: Bony retropulsion from chronic L2 compression fracture which results in moderate canal stenosis and moderate to severe stenosis of the lateral recesses and neural foramen bilaterally. Unchanged. L3-L4: Mild bony retropulsion of the L4 vertebral body, progressed from prior. Mild diffuse disc bulge and bilateral facet arthrosis  resulting in moderate canal stenosis with moderate stenosis of the lateral recesses and neural foramen bilaterally. The degree of canal stenosis has slightly progressed secondary to the L4 vertebral retropulsion. L4-L5: No significant disc protrusion. Severe bilateral facet arthrosis results in severe narrowing of the bilateral neural foramen and moderate narrowing of the canal and bilateral subarticular recesses. Unchanged. L5-S1: Bilateral facet arthropathy without foraminal or canal stenosis. Unchanged. IMPRESSION: 1. Interval progression of L4 vertebral body compression fracture, now with greater than 50% vertebral body height loss. There is also slight increased bony retropulsion this level resulting in moderate canal stenosis, slightly progressed from prior. 2. Low signal changes within the L4 vertebral body, favored to represent interval cement augmentation. Correlate with interval procedural history. 3. Interval resolution of previously seen bilateral psoas muscle fluid collections, likely resolved intramuscular hematomas. 4. 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 5. Multilevel lumbar spondylosis at the remaining vertebral levels including moderate canal stenosis at L2-3 and L4-5, not significantly progressed compared to the prior MRI 04/18/2019. Electronically Signed   By: Davina Poke M.D.   On: 06/29/2019 10:09    Assessment & Plan:   Javeion was seen today for diabetes.  Diagnoses and all orders for this visit:  Essential hypertension- His blood pressure is adequately well controlled. -     CBC with Differential/Platelet; Future -     Basic metabolic panel; Future -     Basic metabolic panel -     CBC with Differential/Platelet  Type II diabetes mellitus with manifestations ([place])- His A1c is up to 8.3%.  Will continue the current glycemic regimen and I have encouraged him to improve his lifestyle modifications. -     Basic metabolic panel;  Future -     Hemoglobin A1c; Future -     Hemoglobin A1c -     Basic metabolic panel  Stage 3a chronic kidney disease ([place])- His renal function has declined slightly.  Will continue to control his blood pressure and his blood sugar. -     CBC with Differential/Platelet; Future -     Basic metabolic panel; Future -     Basic metabolic panel -     CBC with Differential/Platelet  Other orders -     Tdap vaccine greater than or equal to 7yo IM   I have discontinued [name]. [facility]'s Accu-Chek Guide Me, Accu-Chek Guide, and Januvia. I am  also having him maintain his multivitamin with minerals, Cholecalciferol, Multiple Vitamins-Minerals (PRESERVISION AREDS PO), vitamin B-12, pregabalin, NovoFine, empagliflozin, Myrbetriq, tamsulosin, FreeStyle Libre 2 Reader, GlucaGen HypoKit, atenolol-chlorthalidone, losartan, pravastatin, DULoxetine, FreeStyle Libre 2 Sensor, and [place].  No orders of the defined types were placed in this encounter.    Follow-up: Return in about 6 months (around 10/22/2021).  [name], MD

## 2021-04-21 NOTE — Progress Notes (Signed)
Pt has been informed that the recommended vaccine TDAP, may not be covered under their current Medicare insurance. Discussed that they will possibly incur a bill for the TDAP vaccine & administration fee.  Pt understands that if they want the TDAP vaccine, Medicare will be billed for an official decision on payment, which will be sent to them in a Medicare Summary Notice (MSN). They understand that if Medicare does not pay, they are responsible for payment.     

## 2021-04-30 ENCOUNTER — Telehealth: Payer: Self-pay

## 2021-04-30 NOTE — Telephone Encounter (Signed)
Scheduled patient for 6 month echo and same day office visit with Dr. Burt Knack 10/15/21. He was grateful for call and agrees with plan.

## 2021-04-30 NOTE — Telephone Encounter (Signed)
-----   Message from Theodoro Parma, RN sent at 04/07/2021  9:55 AM EDT ----- Regarding: 6 mo echo/same day [place] (due 10/08/2021)

## 2021-05-06 DIAGNOSIS — M48062 Spinal stenosis, lumbar region with neurogenic claudication: Secondary | ICD-10-CM | POA: Diagnosis not present

## 2021-05-06 DIAGNOSIS — M4726 Other spondylosis with radiculopathy, lumbar region: Secondary | ICD-10-CM | POA: Diagnosis not present

## 2021-05-07 ENCOUNTER — Other Ambulatory Visit: Payer: Self-pay | Admitting: Internal Medicine

## 2021-05-21 ENCOUNTER — Telehealth: Payer: Self-pay | Admitting: Internal Medicine

## 2021-05-21 NOTE — Telephone Encounter (Signed)
LVM for pt to rtn my call to schedule AWV with [facility]. Please schedule this appt if pt calls the office.  °

## 2021-06-15 ENCOUNTER — Other Ambulatory Visit: Payer: Self-pay | Admitting: Internal Medicine

## 2021-06-23 ENCOUNTER — Other Ambulatory Visit: Payer: Self-pay | Admitting: Internal Medicine

## 2021-06-23 DIAGNOSIS — E118 Type 2 diabetes mellitus with unspecified complications: Secondary | ICD-10-CM

## 2021-07-17 ENCOUNTER — Telehealth: Payer: Self-pay | Admitting: Pharmacist

## 2021-07-17 NOTE — Progress Notes (Signed)
Chronic Care Management Pharmacy Assistant   Name: Ryan Duke  MRN: [id number] DOB: 01/29/1941   Reason for Encounter: Disease State   Conditions to be addressed/monitored: DMII  Recent office visits:  04/21/21 Ryan Lima, MD (Physician) Diabetes I have discontinued Ryan Duke. Ryan Duke's Accu-Chek Guide Me, Accu-Chek Guide, and Januvia.  Recent consult visits:  04/07/21 Ryan Mocha, MD Cardiology (aortic stenosis) EKG 12-Lead, ECHOCARDIOGRAM Ryan Duke Hospital visits:  None in previous 6 months  Medications: Outpatient Encounter Medications as of 07/17/2021  Medication Sig   atenolol-chlorthalidone (TENORETIC) 100-25 MG tablet TAKE 1 TABLET ONCE DAILY.   Cholecalciferol 50 MCG (2000 UT) TABS Take 1 tablet (2,000 Units total) by mouth daily.   Continuous Blood Gluc Receiver (FREESTYLE LIBRE 2 READER) DEVI 1 Act by Does not apply route daily.   Continuous Blood Gluc Sensor (FREESTYLE LIBRE 2 SENSOR) MISC USE AS DIRECTED AND REPLACE EVERY 14 DAYS.   DULoxetine (CYMBALTA) 60 MG capsule TAKE (1) CAPSULE DAILY.   glucagon (GLUCAGEN HYPOKIT) 1 MG SOLR injection Inject 1 mg into the vein once as needed for up to 1 dose for low blood sugar.   Insulin Glargine-Lixisenatide (SOLIQUA) 100-33 UNT-MCG/ML SOPN Inject 20 Units into the skin daily.   Insulin Pen Needle (NOVOFINE) 32G X 6 MM MISC 1 Act by Does not apply route daily.   JARDIANCE 25 MG TABS tablet TAKE 1 TABLET ONCE DAILY BEFORE BREAKFAST.   losartan (COZAAR) 100 MG tablet TAKE 1 TABLET DAILY FOR HYPERTENSION.   Multiple Vitamin (MULTIVITAMIN WITH MINERALS) TABS tablet Take 1 tablet by mouth 2 (two) times a week.    Multiple Vitamins-Minerals (PRESERVISION AREDS PO) Take by mouth.   MYRBETRIQ 50 MG TB24 tablet TAKE 1 TABLET ONCE DAILY.   pravastatin (PRAVACHOL) 40 MG tablet TAKE 1 TABLET ONCE DAILY.   pregabalin (LYRICA) 150 MG capsule Take 150 mg by mouth 3 (three) times daily.   tamsulosin (FLOMAX) 0.4 MG CAPS capsule  TAKE (1) CAPSULE DAILY.   vitamin B-12 (CYANOCOBALAMIN) 500 MCG tablet Take 500 mcg by mouth daily.   No facility-administered encounter medications on file as of 07/17/2021.     Recent Relevant Labs: Lab Results  Component Value Date/Time   HGBA1C 8.3 (H) 04/21/2021 11:27 AM   HGBA1C 8.0 (A) 01/27/2021 11:36 AM   HGBA1C 7.6 (A) 10/10/2020 10:56 AM   HGBA1C 7.9 (H) 11/17/2018 08:36 AM   HGBA1C 5.8 06/05/2016 12:00 AM   MICROALBUR 3.0 (H) 10/10/2020 11:28 AM   MICROALBUR 26.5 (H) 12/04/2019 03:33 PM    Kidney Function Lab Results  Component Value Date/Time   CREATININE 1.74 (H) 04/21/2021 11:27 AM   CREATININE 1.60 (H) 10/10/2020 11:28 AM   GFR 36.15 (L) 04/21/2021 11:27 AM   GFRNONAA 51 (L) 01/27/2019 03:02 PM   GFRAA 60 (L) 01/27/2019 03:02 PM     Contacted patient on 07/17/21 to discuss diabetes disease state.   Current antihyperglycemic regimen:  Jardiance 25 mg 1 tab daily Soliquia 20 units into skin daily   Patient verbally confirms he is taking the above medications as directed. Yes  What diet changes have been made to improve diabetes control?Patient states that he has not made any changes to his diet  What recent interventions/DTPs have been made to improve glycemic control:   Will continue the current glycemic regimen and I have encouraged him to improve his lifestyle modifications. Per Dr. Ronnald Duke  Have there been any recent hospitalizations or ED visits since last visit with [facility]? No  Patient denies hypoglycemic symptoms, including None  Patient denies hyperglycemic symptoms, including none  How often are you checking your blood sugar? twice daily  What are your blood sugars ranging?  Fasting: NA Before meals: 152 After meals: NA Bedtime: NA  During the week, how often does your blood glucose drop below 70? Never  Are you checking your feet daily/regularly? No  Adherence Review: Is the patient currently on a STATIN medication? Yes Is the patient  currently on ACE/ARB medication? Yes Does the patient have >5 day gap between last estimated fill dates? No  Care Gaps: Last annual wellness visit:12/04/19 Last eye exam / retinopathy screening:03/05/21 Last diabetic foot exam:10/10/20  Star Rating Drugs:  Medication:  Last Fill: [facility] Losartan 100 mg 05/14/21 90   Pravastatin 40 mg 05/14/21 90  No appointments scheduled within the next 30 days.   [place] Pharmacist Assistant [phone]   Time spent:29

## 2021-07-21 DIAGNOSIS — L905 Scar conditions and fibrosis of skin: Secondary | ICD-10-CM | POA: Diagnosis not present

## 2021-07-21 DIAGNOSIS — D229 Melanocytic nevi, unspecified: Secondary | ICD-10-CM | POA: Diagnosis not present

## 2021-07-21 DIAGNOSIS — D485 Neoplasm of uncertain behavior of skin: Secondary | ICD-10-CM | POA: Diagnosis not present

## 2021-07-21 DIAGNOSIS — C44622 Squamous cell carcinoma of skin of right upper limb, including shoulder: Secondary | ICD-10-CM | POA: Diagnosis not present

## 2021-07-21 DIAGNOSIS — L570 Actinic keratosis: Secondary | ICD-10-CM | POA: Diagnosis not present

## 2021-08-02 ENCOUNTER — Other Ambulatory Visit: Payer: Self-pay | Admitting: Internal Medicine

## 2021-08-27 DIAGNOSIS — C44622 Squamous cell carcinoma of skin of right upper limb, including shoulder: Secondary | ICD-10-CM | POA: Diagnosis not present

## 2021-09-02 ENCOUNTER — Other Ambulatory Visit: Payer: Self-pay

## 2021-09-03 ENCOUNTER — Ambulatory Visit (INDEPENDENT_AMBULATORY_CARE_PROVIDER_SITE_OTHER): Payer: Medicare Other | Admitting: Internal Medicine

## 2021-09-03 ENCOUNTER — Encounter: Payer: Self-pay | Admitting: Internal Medicine

## 2021-09-03 VITALS — BP 164/76 | HR 66 | Temp 98.0°F | Resp 16 | Ht 69.0 in | Wt 231.0 lb

## 2021-09-03 DIAGNOSIS — E118 Type 2 diabetes mellitus with unspecified complications: Secondary | ICD-10-CM | POA: Diagnosis not present

## 2021-09-03 DIAGNOSIS — N1831 Chronic kidney disease, stage 3a: Secondary | ICD-10-CM

## 2021-09-03 DIAGNOSIS — I10 Essential (primary) hypertension: Secondary | ICD-10-CM

## 2021-09-03 LAB — BASIC METABOLIC PANEL
BUN: 26 mg/dL — ABNORMAL HIGH (ref 6–23)
CO2: 31 mEq/L (ref 19–32)
Calcium: 9.7 mg/dL (ref 8.4–10.5)
Chloride: 100 mEq/L (ref 96–112)
Creatinine, Ser: 1.43 mg/dL (ref 0.40–1.50)
GFR: 45.63 mL/min — ABNORMAL LOW (ref >60.00)
Glucose, Bld: 150 mg/dL — ABNORMAL HIGH (ref 70–99)
Potassium: 3.8 mEq/L (ref 3.5–5.1)
Sodium: 139 mEq/L (ref 135–145)

## 2021-09-03 LAB — HEMOGLOBIN A1C: Hgb A1c MFr Bld: 7.9 % — ABNORMAL HIGH (ref 4.6–6.5)

## 2021-09-03 MED ORDER — KERENDIA 10 MG PO TABS: 1.0000 | ORAL_TABLET | Freq: Every day | ORAL | 0 refills | Status: DC

## 2021-09-03 MED ORDER — LOSARTAN POTASSIUM 100 MG PO TABS: 100.0000 mg | ORAL_TABLET | Freq: Every day | ORAL | 1 refills | Status: DC

## 2021-09-03 NOTE — Progress Notes (Signed)
Subjective:  Patient ID: Ryan Duke, male    DOB: 1941/11/08  Age: 80 y.o. MRN: [id number]  CC: Hypertension and Diabetes  This visit occurred during the SARS-CoV-2 public health emergency.  Safety protocols were in place, including screening questions prior to the visit, additional usage of staff PPE, and extensive cleaning of exam room while observing appropriate contact time as indicated for disinfecting solutions.    HPI Ryan Duke presents for f/up -  He recently ran out of losartan.  He has lost weight with lifestyle modifications.  He denies chest pain, shortness of breath, diaphoresis, dizziness, lightheadedness, or near syncope.  Outpatient Medications Prior to Visit  Medication Sig Dispense Refill   atenolol-chlorthalidone (TENORETIC) 100-25 MG tablet TAKE ONE TABLET BY MOUTH ONCE DAILY 90 tablet 1   Cholecalciferol 50 MCG (2000 UT) TABS Take 1 tablet (2,000 Units total) by mouth daily. 90 tablet 1   Continuous Blood Gluc Receiver (FREESTYLE LIBRE 2 READER) DEVI 1 Act by Does not apply route daily. 2 each 5   Continuous Blood Gluc Sensor (FREESTYLE LIBRE 2 SENSOR) MISC USE AS DIRECTED AND REPLACE EVERY 14 DAYS. 2 each 5   DULoxetine (CYMBALTA) 60 MG capsule TAKE (1) CAPSULE DAILY. 90 capsule 1   glucagon (GLUCAGEN HYPOKIT) 1 MG SOLR injection Inject 1 mg into the vein once as needed for up to 1 dose for low blood sugar. 2 each 2   Insulin Glargine-Lixisenatide (SOLIQUA) 100-33 UNT-MCG/ML SOPN Inject 20 Units into the skin daily. 9 mL 1   Insulin Pen Needle (NOVOFINE) 32G X 6 MM MISC 1 Act by Does not apply route daily. 100 each 1   JARDIANCE 25 MG TABS tablet TAKE 1 TABLET ONCE DAILY BEFORE BREAKFAST. 90 tablet 0   Multiple Vitamin (MULTIVITAMIN WITH MINERALS) TABS tablet Take 1 tablet by mouth 2 (two) times a week.      Multiple Vitamins-Minerals (PRESERVISION AREDS PO) Take by mouth.     MYRBETRIQ 50 MG TB24 tablet TAKE 1 TABLET ONCE DAILY. 90 tablet 1   pravastatin  (PRAVACHOL) 40 MG tablet TAKE ONE TABLET BY MOUTH ONCE DAILY. 90 tablet 1   pregabalin (LYRICA) 150 MG capsule Take 150 mg by mouth 3 (three) times daily.     tamsulosin (FLOMAX) 0.4 MG CAPS capsule TAKE (1) CAPSULE DAILY. 90 capsule 1   vitamin B-12 (CYANOCOBALAMIN) 500 MCG tablet Take 500 mcg by mouth daily.     losartan (COZAAR) 100 MG tablet TAKE 1 TABLET DAILY FOR HYPERTENSION. 90 tablet 1   No facility-administered medications prior to visit.    ROS Review of Systems  Constitutional:  Negative for diaphoresis and fatigue.  HENT: Negative.    Eyes: Negative.   Respiratory:  Negative for cough, chest tightness and wheezing.   Cardiovascular:  Negative for chest pain, palpitations and leg swelling.  Gastrointestinal:  Negative for abdominal pain, constipation, diarrhea, nausea and vomiting.  Endocrine: Negative.   Genitourinary: Negative.  Negative for difficulty urinating.  Musculoskeletal:  Positive for back pain. Negative for arthralgias and myalgias.  Neurological: Negative.  Negative for dizziness, weakness and light-headedness.  Hematological:  Negative for adenopathy. Does not bruise/bleed easily.  Psychiatric/Behavioral: Negative.     Objective:  BP (!) 164/76 (BP Location: Left Arm, Patient Position: Sitting, Cuff Size: Large)   Pulse 66   Temp 98 F (36.7 C) (Oral)   Resp 16   Ht 5\' 9"  (1.753 m)   Wt 231 lb (104.8 kg)   SpO2 99%  BMI 34.11 kg/m   BP Readings from Last 3 Encounters:  09/03/21 (!) 164/76  04/21/21 138/78  04/07/21 (!) 142/90    Wt Readings from Last 3 Encounters:  09/03/21 231 lb (104.8 kg)  04/21/21 237 lb (107.5 kg)  01/27/21 235 lb (106.6 kg)    Physical Exam Vitals reviewed.  HENT:     Nose: Nose normal.     Mouth/Throat:     Mouth: Mucous membranes are moist.  Eyes:     General: No scleral icterus.    Conjunctiva/sclera: Conjunctivae normal.  Cardiovascular:     Rate and Rhythm: Normal rate and regular rhythm.     Heart  sounds: Murmur heard.  Systolic murmur is present with a grade of 2/6.  No diastolic murmur is present.    No friction rub. No gallop.  Pulmonary:     Effort: Pulmonary effort is normal.     Breath sounds: No stridor. No wheezing, rhonchi or rales.  Abdominal:     General: Abdomen is flat.     Palpations: There is no mass.     Tenderness: There is no abdominal tenderness. There is no guarding.  Musculoskeletal:        General: Normal range of motion.     Cervical back: Neck supple.     Right lower leg: No edema.     Left lower leg: No edema.  Lymphadenopathy:     Cervical: No cervical adenopathy.  Skin:    General: Skin is warm.  Neurological:     General: No focal deficit present.     Mental Status: He is alert.    Lab Results  Component Value Date   WBC 7.7 04/21/2021   HGB 14.8 04/21/2021   HCT 45.4 04/21/2021   PLT 199.0 04/21/2021   GLUCOSE 150 (H) 09/03/2021   CHOL 134 10/10/2020   TRIG 166.0 (H) 10/10/2020   HDL 37.80 (L) 10/10/2020   LDLDIRECT 81.0 05/26/2018   LDLCALC 63 10/10/2020   ALT 11 10/10/2020   AST 15 10/10/2020   NA 139 09/03/2021   K 3.8 09/03/2021   CL 100 09/03/2021   CREATININE 1.43 09/03/2021   BUN 26 (H) 09/03/2021   CO2 31 09/03/2021   TSH 3.61 12/04/2019   PSA 1.05 10/28/2015   INR 1.04 09/14/2017   HGBA1C 7.9 (H) 09/03/2021   MICROALBUR 3.0 (H) 10/10/2020    MR LUMBAR SPINE WO CONTRAST  Result Date: 06/29/2019 CLINICAL DATA:  Vertebral compression fractures status post kyphoplasty. Persistent severe back pain with right-sided radiculopathy EXAM: MRI LUMBAR SPINE WITHOUT CONTRAST TECHNIQUE: Multiplanar, multisequence MR imaging of the lumbar spine was performed. No intravenous contrast was administered. COMPARISON:  MRI 04/19/2019 FINDINGS: Segmentation:  Standard. Alignment:  Unchanged. 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 Interval  progression in the degree of vertebral body height loss of a subacute L4 vertebral body compression fracture, now with greater than 50% height loss. Mild bony retropulsion the superior aspect of the L4 vertebral body. There is persistent marrow edema within the vertebral body and extending into the pedicles bilaterally. Low T1/T2 signal centrally within the vertebral body suggesting cement augmentation. No new fracture. Discogenic marrow changes throughout the remaining lumbar levels. Conus medullaris and cauda equina: Conus extends to the T12-L1 level. Conus and cauda equina appear normal. Paraspinal and other soft tissues: The previously seen bilateral psoas muscle fluid collections have resolved. Remaining paravertebral soft tissues are within  normal limits. Disc levels: T12-L1: Mild diffuse disc bulge with left greater than right facet arthrosis. No foraminal or canal stenosis. Unchanged. L1-L2: Mild diffuse disc bulge with bilateral facet arthrosis results in mild canal stenosis without foraminal narrowing. Unchanged. L2-L3: Bony retropulsion from chronic L2 compression fracture which results in moderate canal stenosis and moderate to severe stenosis of the lateral recesses and neural foramen bilaterally. Unchanged. L3-L4: Mild bony retropulsion of the L4 vertebral body, progressed from prior. Mild diffuse disc bulge and bilateral facet arthrosis resulting in moderate canal stenosis with moderate stenosis of the lateral recesses and neural foramen bilaterally. The degree of canal stenosis has slightly progressed secondary to the L4 vertebral retropulsion. L4-L5: No significant disc protrusion. Severe bilateral facet arthrosis results in severe narrowing of the bilateral neural foramen and moderate narrowing of the canal and bilateral subarticular recesses. Unchanged. L5-S1: Bilateral facet arthropathy without foraminal or canal stenosis. Unchanged. IMPRESSION: 1. Interval progression of L4 vertebral body  compression fracture, now with greater than 50% vertebral body height loss. There is also slight increased bony retropulsion this level resulting in moderate canal stenosis, slightly progressed from prior. 2. Low signal changes within the L4 vertebral body, favored to represent interval cement augmentation. Correlate with interval procedural history. 3. Interval resolution of previously seen bilateral psoas muscle fluid collections, likely resolved intramuscular hematomas. 4. 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 5. Multilevel lumbar spondylosis at the remaining vertebral levels including moderate canal stenosis at L2-3 and L4-5, not significantly progressed compared to the prior MRI 04/18/2019. Electronically Signed   By: Davina Poke M.D.   On: 06/29/2019 10:09    Assessment & Plan:   Frances was seen today for hypertension and diabetes.  Diagnoses and all orders for this visit:  Essential hypertension- His blood pressure is not adequately well controlled.  Will restart the ARB. -     Basic metabolic panel; Future -     losartan (COZAAR) 100 MG tablet; Take 1 tablet (100 mg total) by mouth daily. for high blood pressure -     Basic metabolic panel  Type II diabetes mellitus with manifestations ([place])- His blood sugar is adequately well controlled.  Will continue the current regimen. -     Basic metabolic panel; Future -     Hemoglobin A1c; Future -     Hemoglobin A1c -     Basic metabolic panel -     Finerenone (KERENDIA) 10 MG TABS; Take 1 tablet by mouth daily.  Stage 3a chronic kidney disease ([place])- I recommended that he start taking a mineralocorticoid antagonist to reduce the progression of and complications from kidney disease. -     Basic metabolic panel; Future -     Basic metabolic panel -     Finerenone (KERENDIA) 10 MG TABS; Take 1 tablet by mouth daily.  I have changed [name]. Hantz's losartan. I am also having him start on  Kerendia. Additionally, I am having him maintain his multivitamin with minerals, Cholecalciferol, Multiple Vitamins-Minerals (PRESERVISION AREDS PO), vitamin B-12, pregabalin, NovoFine, Myrbetriq, FreeStyle Libre 2 Reader, [employer] HypoKit, DULoxetine, FreeStyle Libre 2 Sensor, [place], tamsulosin, Jardiance, pravastatin, and atenolol-chlorthalidone.  Meds ordered this encounter  Medications   losartan (COZAAR) 100 MG tablet    Sig: Take 1 tablet (100 mg total) by mouth daily. for high blood pressure    Dispense:  90 tablet    Refill:  1   Finerenone (KERENDIA) 10 MG TABS    Sig: Take  1 tablet by mouth daily.    Dispense:  30 tablet    Refill:  0      Follow-up: Return in about 3 months (around 12/04/2021).  [name], MD

## 2021-09-03 NOTE — Patient Instructions (Signed)

## 2021-09-04 ENCOUNTER — Telehealth: Payer: Self-pay

## 2021-09-04 NOTE — Telephone Encounter (Signed)
Key: [id number]

## 2021-09-08 NOTE — Telephone Encounter (Signed)
Per CoverMyMeds: ? ?PA was denied.  ?

## 2021-09-11 NOTE — Telephone Encounter (Signed)
Norma from [employer] status of appeal for Finerenone [facility]) 10 MG TABS  Please advise [phone]

## 2021-09-15 ENCOUNTER — Other Ambulatory Visit: Payer: Self-pay | Admitting: Internal Medicine

## 2021-09-16 ENCOUNTER — Other Ambulatory Visit: Payer: Self-pay | Admitting: Internal Medicine

## 2021-09-16 NOTE — Telephone Encounter (Signed)
Makayla from [employer] checking status of appeal for Finerenone [facility]) 10 MG TABS   Please advise [phone].

## 2021-09-23 ENCOUNTER — Other Ambulatory Visit (HOSPITAL_BASED_OUTPATIENT_CLINIC_OR_DEPARTMENT_OTHER): Payer: Self-pay

## 2021-09-23 ENCOUNTER — Ambulatory Visit: Payer: Self-pay | Attending: Internal Medicine

## 2021-09-23 DIAGNOSIS — Z23 Encounter for immunization: Secondary | ICD-10-CM

## 2021-09-23 MED ORDER — PFIZER COVID-19 VAC BIVALENT 30 MCG/0.3ML IM SUSP
INTRAMUSCULAR | 0 refills | Status: DC
  Filled 2021-09-23: qty 0.3, 1d supply, fill #0

## 2021-09-23 NOTE — Progress Notes (Signed)
   Covid-19 Vaccination Clinic  Name:  Ryan Duke    MRN: [id number] DOB: 05-12-1941  09/23/2021  [name] was observed post Covid-19 immunization for 15 minutes without incident. He was provided with Vaccine Information Sheet and instruction to access the V-Safe system.   [name] was instructed to call 911 with any severe reactions post vaccine: Difficulty breathing  Swelling of face and throat  A fast heartbeat  A bad rash all over body  Dizziness and weakness   Immunizations Administered     Name Date Dose VIS Date Route   Pfizer Covid-19 Vaccine Bivalent Booster 09/23/2021  1:26 PM 0.3 mL 07/30/2021 Intramuscular   Manufacturer: [place]   Lot: [id number]   [place]: [phone]

## 2021-09-26 ENCOUNTER — Other Ambulatory Visit: Payer: Self-pay | Admitting: Internal Medicine

## 2021-09-26 DIAGNOSIS — E118 Type 2 diabetes mellitus with unspecified complications: Secondary | ICD-10-CM

## 2021-10-11 ENCOUNTER — Other Ambulatory Visit: Payer: Self-pay | Admitting: Internal Medicine

## 2021-10-11 DIAGNOSIS — N1831 Chronic kidney disease, stage 3a: Secondary | ICD-10-CM

## 2021-10-11 DIAGNOSIS — E118 Type 2 diabetes mellitus with unspecified complications: Secondary | ICD-10-CM

## 2021-10-12 ENCOUNTER — Other Ambulatory Visit: Payer: Self-pay | Admitting: Internal Medicine

## 2021-10-12 DIAGNOSIS — E118 Type 2 diabetes mellitus with unspecified complications: Secondary | ICD-10-CM

## 2021-10-12 DIAGNOSIS — N1831 Chronic kidney disease, stage 3a: Secondary | ICD-10-CM

## 2021-10-12 MED ORDER — KERENDIA 20 MG PO TABS: 1.0000 | ORAL_TABLET | Freq: Every day | ORAL | 0 refills | Status: DC

## 2021-10-14 ENCOUNTER — Encounter: Payer: Self-pay | Admitting: Internal Medicine

## 2021-10-15 ENCOUNTER — Ambulatory Visit (HOSPITAL_COMMUNITY): Payer: Medicare Other | Attending: Cardiovascular Disease

## 2021-10-15 ENCOUNTER — Telehealth: Payer: Self-pay | Admitting: Internal Medicine

## 2021-10-15 ENCOUNTER — Other Ambulatory Visit: Payer: Self-pay | Admitting: Internal Medicine

## 2021-10-15 ENCOUNTER — Encounter: Payer: Self-pay | Admitting: Cardiovascular Disease

## 2021-10-15 ENCOUNTER — Other Ambulatory Visit: Payer: Self-pay

## 2021-10-15 ENCOUNTER — Ambulatory Visit: Payer: Medicare Other | Admitting: Cardiovascular Disease

## 2021-10-15 VITALS — BP 144/90 | HR 107 | Ht 71.0 in | Wt 233.0 lb

## 2021-10-15 DIAGNOSIS — N1831 Chronic kidney disease, stage 3a: Secondary | ICD-10-CM

## 2021-10-15 DIAGNOSIS — I350 Nonrheumatic aortic (valve) stenosis: Secondary | ICD-10-CM

## 2021-10-15 DIAGNOSIS — E782 Mixed hyperlipidemia: Secondary | ICD-10-CM | POA: Diagnosis not present

## 2021-10-15 DIAGNOSIS — I10 Essential (primary) hypertension: Secondary | ICD-10-CM | POA: Diagnosis not present

## 2021-10-15 DIAGNOSIS — E118 Type 2 diabetes mellitus with unspecified complications: Secondary | ICD-10-CM

## 2021-10-15 LAB — ECHOCARDIOGRAM COMPLETE
AR max vel: 0.87 cm2
AV Area VTI: 0.86 cm2
AV Area mean vel: 0.89 cm2
AV Mean grad: 47.5 mmHg
AV Peak grad: 82.4 mmHg
Ao pk vel: 4.54 m/s
Area-P 1/2: 2.36 cm2
P 1/2 time: 568 msec
S' Lateral: 2.3 cm

## 2021-10-15 MED ORDER — PERFLUTREN LIPID MICROSPHERE
1.0000 mL | INTRAVENOUS | Status: AC | PRN
Start: 2021-10-15 — End: 2021-10-15
  Administered 2021-10-15: 1 mL via INTRAVENOUS

## 2021-10-15 NOTE — Progress Notes (Signed)
Cardiology Office Note:    Date:  10/15/2021   ID:  Ryan Duke, DOB 1941-04-19, MRN [id number]  PCP:  Janith Lima, MD   [facility] Providers Cardiologist:  Sherren Mocha, MD     Referring MD: Janith Lima, MD   Chief Complaint  Patient presents with   Aortic Stenosis     History of Present Illness:    Ryan Duke is a 80 y.o. male with a hx of aortic stenosis, presenting for follow-up evaluation.  His last office visit was in May of this year.  He has been found to have severe, stage C aortic stenosis and has been managed with clinical surveillance in the setting of his asymptomatic clinical status.  The patient is here alone today.  He has had a lot going on in his personal life.  His wife has been dealing with chronic back problems and has had a lot of physical limitation related to this.  The patient continues to feel well.  He is able to do all of his household chores and ADLs without symptoms.  He specifically denies any chest pain, chest pressure, shortness of breath, heart palpitations, lightheadedness, or syncope.  He denies any fatigue.  Past Medical History:  Diagnosis Date   Diabetes mellitus    type 2   Hyperlipidemia    Hypertension    Hypogonadism, male    Pancreatitis 2008   Renal insufficiency     Past Surgical History:  Procedure Laterality Date   EYE SURGERY     cataracts   FLEXIBLE SIGMOIDOSCOPY N/A 09/10/2017   Procedure: FLEXIBLE SIGMOIDOSCOPY;  Surgeon: Mauri Pole, MD;  Location: [facility];  Service: Endoscopy;  Laterality: N/A;   IR KYPHO LUMBAR INC FX REDUCE BONE BX UNI/BIL CANNULATION INC/IMAGING  09/14/2017   IR RADIOLOGIST EVAL & MGMT  10/07/2017    Current Medications: Current Meds  Medication Sig   atenolol-chlorthalidone (TENORETIC) 100-25 MG tablet TAKE ONE TABLET BY MOUTH ONCE DAILY   Cholecalciferol 50 MCG (2000 UT) TABS Take 1 tablet (2,000 Units total) by mouth daily.   Continuous Blood Gluc Receiver  (FREESTYLE LIBRE 2 READER) DEVI 1 Act by Does not apply route daily.   Continuous Blood Gluc Sensor (FREESTYLE LIBRE 2 SENSOR) MISC USE AS DIRECTED AND REPLACE EVERY 14 DAYS.   COVID-19 mRNA bivalent vaccine, Pfizer, (PFIZER COVID-19 VAC BIVALENT) injection Inject into the muscle.   DULoxetine (CYMBALTA) 60 MG capsule TAKE (1) CAPSULE DAILY.   Finerenone (KERENDIA) 20 MG TABS Take 1 tablet by mouth daily.   Insulin Glargine-Lixisenatide (SOLIQUA) 100-33 UNT-MCG/ML SOPN Inject 20 Units into the skin daily.   Insulin Pen Needle (NOVOFINE) 32G X 6 MM MISC 1 Act by Does not apply route daily.   JARDIANCE 25 MG TABS tablet TAKE 1 TABLET ONCE DAILY BEFORE BREAKFAST.   losartan (COZAAR) 100 MG tablet Take 1 tablet (100 mg total) by mouth daily. for high blood pressure   Multiple Vitamin (MULTIVITAMIN WITH MINERALS) TABS tablet Take 1 tablet by mouth 2 (two) times a week.    Multiple Vitamins-Minerals (PRESERVISION AREDS PO) Take by mouth.   MYRBETRIQ 50 MG TB24 tablet TAKE 1 TABLET ONCE DAILY.   pravastatin (PRAVACHOL) 40 MG tablet TAKE ONE TABLET BY MOUTH ONCE DAILY.   pregabalin (LYRICA) 150 MG capsule Take 150 mg by mouth 3 (three) times daily.   tamsulosin (FLOMAX) 0.4 MG CAPS capsule TAKE (1) CAPSULE DAILY.   vitamin B-12 (CYANOCOBALAMIN) 500 MCG tablet Take 500  mcg by mouth daily.     Allergies:   Fentanyl, Farxiga [dapagliflozin], Metformin and related, Atorvastatin, Benazepril hcl, Flexeril [cyclobenzaprine], and Oxycodone   Social History   Socioeconomic History   Marital status: Married    Spouse name: Not on file   Number of children: Not on file   Years of education: Not on file   Highest education level: Not on file  Occupational History   Occupation: retired  Tobacco Use   Smoking status: Former    Types: Cigarettes    Quit date: 11/30/1974    Years since quitting: 46.9   Smokeless tobacco: Never  Vaping Use   Vaping Use: Never used  Substance and Sexual Activity   Alcohol  use: Not Currently    Alcohol/week: 0.0 standard drinks   Drug use: No   Sexual activity: Yes  Other Topics Concern   Not on file  Social History Narrative   Regular exercise-yes   Social Determinants of Health   Financial Resource Strain: Not on file  Food Insecurity: Not on file  Transportation Needs: Not on file  Physical Activity: Not on file  Stress: Not on file  Social Connections: Not on file     Family History: The patient's family history includes Hypertension in an other family member.  ROS:   Please see the history of present illness.    All other systems reviewed and are negative.  EKGs/Labs/Other Studies Reviewed:    The following studies were reviewed today: 2D echocardiogram is currently pending but is personally reviewed today.  The patient has vigorous LV systolic function with LVEF over 65% by my estimate.  There is severe calcific aortic stenosis present with peak and mean transvalvular gradients of 84 and 48 mmHg, respectively.  The peak transaortic velocity is 4.6 m/s and dimensionless index is 0.27.   Recent Labs: 04/21/2021: Hemoglobin 14.8; Platelets 199.0 09/03/2021: BUN 26; Creatinine, Ser 1.43; Potassium 3.8; Sodium 139  Recent Lipid Panel    Component Value Date/Time   CHOL 134 10/10/2020 1128   TRIG 166.0 (H) 10/10/2020 1128   TRIG 171 (H) 10/06/2006 0741   HDL 37.80 (L) 10/10/2020 1128   CHOLHDL 4 10/10/2020 1128   VLDL 33.2 10/10/2020 1128   LDLCALC 63 10/10/2020 1128   LDLDIRECT 81.0 05/26/2018 1547     Risk Assessment/Calculations:           Physical Exam:    VS:  BP (!) 144/90   Pulse (!) 107   Ht 5\' 11"  (1.803 m)   Wt 233 lb (105.7 kg)   SpO2 96%   BMI 32.50 kg/m     Wt Readings from Last 3 Encounters:  10/15/21 233 lb (105.7 kg)  09/03/21 231 lb (104.8 kg)  04/21/21 237 lb (107.5 kg)     GEN:  Well nourished, well developed in no acute distress HEENT: Normal NECK: No JVD; No carotid bruits LYMPHATICS: No  lymphadenopathy CARDIAC: RRR, 3/6 harsh crescendo decrescendo murmur at the right upper sternal border, A2 present but diminished. RESPIRATORY:  Clear to auscultation without rales, wheezing or rhonchi  ABDOMEN: Soft, non-tender, non-distended MUSCULOSKELETAL:  No edema; No deformity  SKIN: Warm and dry NEUROLOGIC:  Alert and oriented x 3 PSYCHIATRIC:  Normal affect   ASSESSMENT:    1. Nonrheumatic aortic valve stenosis   2. Essential hypertension   3. Mixed hyperlipidemia    PLAN:    In order of problems listed above:  Discussed treatment strategies, cardinal symptoms of aortic stenosis, and  pros and cons of intervention versus ongoing medical therapy and surveillance.  The patient remains completely asymptomatic.  He would like to continue with clinical surveillance, especially while he is dealing with his wife's health problems.  I will see him back in 6 months with a repeat echocardiogram.  Today's echo is compared to his previous echo study.  The peak transaortic velocity is increased from 3.9 to 4.6 m/s and mean gradient has increased from 40 to 48 mmHg.  I did review the cardinal symptoms of aortic stenosis and asked him to seek immediate medical attention if any of these arise. Blood pressure treated with losartan in the setting of diabetes as well as atenolol/chlorthalidone.  He will continue the same.  He is followed by his primary physician. Treated with pravastatin 40 mg daily.  Last LDL cholesterol was 63 mg/dL at goal.  Disposition: We discussed TAVR versus clinical surveillance at length today.  I reviewed the pre-TAVR work-up with the patient which would include a cardiac catheterization and CT angiography studies.  We discussed the procedural expectations and typical recovery after TAVR.  Shared decision making occurs and he prefers to continue with clinical surveillance for now.  I will see him back in 6 months as above.     Medication Adjustments/Labs and Tests  Ordered: Current medicines are reviewed at length with the patient today.  Concerns regarding medicines are outlined above.  No orders of the defined types were placed in this encounter.  No orders of the defined types were placed in this encounter.   There are no Patient Instructions on file for this visit.   Signed, Sherren Mocha, MD  10/15/2021 10:44 AM    [place]

## 2021-10-15 NOTE — Patient Instructions (Signed)
Medication Instructions:  *If you need a refill on your cardiac medications before your next appointment, please call your pharmacy*  Lab Work: If you have labs (blood work) drawn today and your tests are completely normal, you will receive your results only by: [place] (if you have MyChart) OR A paper copy in the mail If you have any lab test that is abnormal or we need to change your treatment, we will call you to review the results.  Testing/Procedures: Your physician has requested that you have an echocardiogram in 6 months with office visit. Echocardiography is a painless test that uses sound waves to create images of your heart. It provides your doctor with information about the size and shape of your heart and how well your heart's chambers and valves are working. This procedure takes approximately one hour. There are no restrictions for this procedure.  Follow-Up: At [facility], you and your health needs are our priority.  As part of our continuing mission to provide you with exceptional heart care, we have created designated Provider Care Teams.  These Care Teams include your primary Cardiologist (physician) and Advanced Practice Providers (APPs -  Physician Assistants and Nurse Practitioners) who all work together to provide you with the care you need, when you need it.  We recommend signing up for the patient portal called "MyChart".  Sign up information is provided on this After Visit Summary.  MyChart is used to connect with patients for Virtual Visits (Telemedicine).  Patients are able to view lab/test results, encounter notes, upcoming appointments, etc.  Non-urgent messages can be sent to your provider as well.   To learn more about what you can do with MyChart, go to [email or web address].    Your next appointment:   6 month(s)  The format for your next appointment:   In Person  Provider:   Sherren Mocha, MD

## 2021-10-16 NOTE — Telephone Encounter (Signed)
[facility] called to verify the dosage change. They received the request for refill for 10MG  but received the order for 20MG . Pharmacist wanted to verify that the change is correct before filling order.   Please advise.

## 2021-10-16 NOTE — Telephone Encounter (Signed)
Spoke with pharmacist and was able to verify the dosage change.

## 2021-11-04 DIAGNOSIS — M48062 Spinal stenosis, lumbar region with neurogenic claudication: Secondary | ICD-10-CM | POA: Diagnosis not present

## 2021-11-04 DIAGNOSIS — M4726 Other spondylosis with radiculopathy, lumbar region: Secondary | ICD-10-CM | POA: Diagnosis not present

## 2021-12-04 ENCOUNTER — Other Ambulatory Visit: Payer: Self-pay

## 2021-12-04 ENCOUNTER — Ambulatory Visit (INDEPENDENT_AMBULATORY_CARE_PROVIDER_SITE_OTHER): Payer: Medicare Other | Admitting: Internal Medicine

## 2021-12-04 ENCOUNTER — Encounter: Payer: Self-pay | Admitting: Internal Medicine

## 2021-12-04 VITALS — BP 126/76 | HR 72 | Temp 97.6°F | Resp 16 | Ht 71.0 in | Wt 226.0 lb

## 2021-12-04 DIAGNOSIS — I10 Essential (primary) hypertension: Secondary | ICD-10-CM | POA: Diagnosis not present

## 2021-12-04 DIAGNOSIS — N1831 Chronic kidney disease, stage 3a: Secondary | ICD-10-CM | POA: Diagnosis not present

## 2021-12-04 DIAGNOSIS — E118 Type 2 diabetes mellitus with unspecified complications: Secondary | ICD-10-CM

## 2021-12-04 DIAGNOSIS — E785 Hyperlipidemia, unspecified: Secondary | ICD-10-CM

## 2021-12-04 LAB — URINALYSIS, ROUTINE W REFLEX MICROSCOPIC
Bilirubin Urine: NEGATIVE
Hgb urine dipstick: NEGATIVE
Ketones, ur: NEGATIVE
Leukocytes,Ua: NEGATIVE
Nitrite: NEGATIVE
Specific Gravity, Urine: 1.015 (ref 1.000–1.030)
Total Protein, Urine: 30 — AB
Urine Glucose: >=1000 — AB
Urobilinogen, UA: 0.2 (ref 0.0–1.0)
pH: 6 (ref 5.0–8.0)

## 2021-12-04 LAB — BASIC METABOLIC PANEL
BUN: 30 mg/dL — ABNORMAL HIGH (ref 6–23)
CO2: 28 mEq/L (ref 19–32)
Calcium: 9.4 mg/dL (ref 8.4–10.5)
Chloride: 101 mEq/L (ref 96–112)
Creatinine, Ser: 1.67 mg/dL — ABNORMAL HIGH (ref 0.40–1.50)
GFR: 37.81 mL/min — ABNORMAL LOW (ref >60.00)
Glucose, Bld: 206 mg/dL — ABNORMAL HIGH (ref 70–99)
Potassium: 3.7 mEq/L (ref 3.5–5.1)
Sodium: 138 mEq/L (ref 135–145)

## 2021-12-04 LAB — HEPATIC FUNCTION PANEL
ALT: 13 U/L (ref 0–53)
AST: 17 U/L (ref 0–37)
Albumin: 4.2 g/dL (ref 3.5–5.2)
Alkaline Phosphatase: 75 U/L (ref 39–117)
Bilirubin, Direct: 0.2 mg/dL (ref 0.0–0.3)
Total Bilirubin: 0.7 mg/dL (ref 0.2–1.2)
Total Protein: 7.3 g/dL (ref 6.0–8.3)

## 2021-12-04 LAB — LIPID PANEL
Cholesterol: 152 mg/dL (ref 0–200)
HDL: 40.2 mg/dL (ref >39.00)
LDL Cholesterol: 93 mg/dL (ref 0–99)
NonHDL: 111.89
Total CHOL/HDL Ratio: 4
Triglycerides: 96 mg/dL (ref 0.0–149.0)
VLDL: 19.2 mg/dL (ref 0.0–40.0)

## 2021-12-04 LAB — MICROALBUMIN / CREATININE URINE RATIO
Creatinine,U: 96.9 mg/dL
Microalb Creat Ratio: 20.9 mg/g (ref 0.0–30.0)
Microalb, Ur: 20.2 mg/dL — ABNORMAL HIGH (ref 0.0–1.9)

## 2021-12-04 LAB — TSH: TSH: 2.96 u[IU]/mL (ref 0.35–5.50)

## 2021-12-04 LAB — HEMOGLOBIN A1C: Hgb A1c MFr Bld: 8.4 % — ABNORMAL HIGH (ref 4.6–6.5)

## 2021-12-04 MED ORDER — SOLIQUA 100-33 UNT-MCG/ML ~~LOC~~ SOPN: 60.0000 [IU] | PEN_INJECTOR | Freq: Every day | SUBCUTANEOUS | 1 refills | Status: DC

## 2021-12-04 NOTE — Patient Instructions (Addendum)
Please increase the SOLIQUA dose by 2 units every 3 days to keep blood sugar lass than 200   Type 2 Diabetes Mellitus, Diagnosis, Adult Type 2 diabetes (type 2 diabetes mellitus) is a long-term, or chronic, disease. In type 2 diabetes, one or both of these problems may be present: The pancreas does not make enough of a hormone called insulin. Cells in the body do not respond properly to the insulin that the body makes (insulin resistance). Normally, insulin allows blood sugar (glucose) to enter cells in the body. The cells use glucose for energy. Insulin resistance or lack of insulin causes excess glucose to build up in the blood instead of going into cells. This causes high blood glucose (hyperglycemia).  What are the causes? The exact cause of type 2 diabetes is not known. What increases the risk? The following factors may make you more likely to develop this condition: Having a family member with type 2 diabetes. Being overweight or obese. Being inactive (sedentary). Having been diagnosed with insulin resistance. Having a history of prediabetes, diabetes when you were pregnant (gestational diabetes), or polycystic ovary syndrome (PCOS). What are the signs or symptoms? In the early stage of this condition, you may not have symptoms. Symptoms develop slowly and may include: Increased thirst or hunger. Increased urination. Unexplained weight loss. Tiredness (fatigue) or weakness. Vision changes, such as blurry vision. Dark patches on the skin. How is this diagnosed? This condition is diagnosed based on your symptoms, your medical history, a physical exam, and your blood glucose level. Your blood glucose may be checked with one or more of the following blood tests: A fasting blood glucose (FBG) test. You will not be allowed to eat (you will fast) for 8 hours or longer before a blood sample is taken. A random blood glucose test. This test checks blood glucose at any time of day regardless  of when you ate. An A1C (hemoglobin A1C) blood test. This test provides information about blood glucose levels over the previous 2-3 months. An oral glucose tolerance test (OGTT). This test measures your blood glucose at two times: After fasting. This is your baseline blood glucose level. Two hours after drinking a beverage that contains glucose. You may be diagnosed with type 2 diabetes if: Your fasting blood glucose level is 126 mg/dL (7.0 mmol/L) or higher. Your random blood glucose level is 200 mg/dL (11.1 mmol/L) or higher. Your A1C level is 6.5% or higher. Your oral glucose tolerance test result is higher than 200 mg/dL (11.1 mmol/L). These blood tests may be repeated to confirm your diagnosis. How is this treated? Your treatment may be managed by a specialist called an endocrinologist. Type 2 diabetes may be treated by following instructions from your health care provider about: Making dietary and lifestyle changes. These may include: Following a personalized nutrition plan that is developed by a registered dietitian. Exercising regularly. Finding ways to manage stress. Checking your blood glucose level as often as told. Taking diabetes medicines or insulin daily. This helps to keep your blood glucose levels in the healthy range. Taking medicines to help prevent complications from diabetes. Medicines may include: Aspirin. Medicine to lower cholesterol. Medicine to control blood pressure. Your health care provider will set treatment goals for you. Your goals will be based on your age, other medical conditions you have, and how you respond to diabetes treatment. Generally, the goal of treatment is to maintain the following blood glucose levels: Before meals: 80-130 mg/dL (4.4-7.2 mmol/L). After meals: below 180  mg/dL (10 mmol/L). A1C level: less than 7%. Follow these instructions at home: Questions to ask your health care provider Consider asking the following questions: Should I  meet with a certified diabetes care and education specialist? What diabetes medicines do I need, and when should I take them? What equipment will I need to manage my diabetes at home? How often do I need to check my blood glucose? Where can I find a support group for people with diabetes? What number can I call if I have questions? When is my next appointment? General instructions Take over-the-counter and prescription medicines only as told by your health care provider. Keep all follow-up visits. This is important. Where to find more information For help and guidance and for more information about diabetes, please visit: American Diabetes Association (ADA): www.diabetes.org American Association of Diabetes Care and Education Specialists (ADCES): www.diabeteseducator.org International Diabetes Federation (IDF): MemberVerification.ca Contact a health care provider if: Your blood glucose is at or above 240 mg/dL (13.3 mmol/L) for 2 days in a row. You have been sick or have had a fever for 2 days or longer, and you are not getting better. You have any of the following problems for more than 6 hours: You cannot eat or drink. You have nausea and vomiting. You have diarrhea. Get help right away if: You have severe hypoglycemia. This means your blood glucose is lower than 54 mg/dL (3.0 mmol/L). You become confused or you have trouble thinking clearly. You have difficulty breathing. You have moderate or large ketone levels in your urine. These symptoms may represent a serious problem that is an emergency. Do not wait to see if the symptoms will go away. Get medical help right away. Call your local emergency services (911 in the [place] Do not drive yourself to the hospital. Summary Type 2 diabetes mellitus is a long-term, or chronic, disease. In type 2 diabetes, the pancreas does not make enough of a hormone called insulin, or cells in the body do not respond properly to insulin that the body makes. This  condition is treated by making dietary and lifestyle changes and taking diabetes medicines or insulin. Your health care provider will set treatment goals for you. Your goals will be based on your age, other medical conditions you have, and how you respond to diabetes treatment. Keep all follow-up visits. This is important. This information is not intended to replace advice given to you by your health care provider. Make sure you discuss any questions you have with your health care provider. Document Revised: 02/10/2021 Document Reviewed: 02/10/2021 Elsevier Patient Education  [place].

## 2021-12-04 NOTE — Progress Notes (Signed)
Subjective:  Patient ID: Ryan Duke, male    DOB: 04/16/1941  Age: 81 y.o. MRN: [id number]  CC: Diabetes  This visit occurred during the SARS-CoV-2 public health emergency.  Safety protocols were in place, including screening questions prior to the visit, additional usage of staff PPE, and extensive cleaning of exam room while observing appropriate contact time as indicated for disinfecting solutions.    HPI Ryan Duke presents for f/up -  He is concerned that his BS is consistently >200 - He has held the soliqua dose at 35 units/day.  Outpatient Medications Prior to Visit  Medication Sig Dispense Refill   atenolol-chlorthalidone (TENORETIC) 100-25 MG tablet TAKE ONE TABLET BY MOUTH ONCE DAILY 90 tablet 1   Cholecalciferol 50 MCG (2000 UT) TABS Take 1 tablet (2,000 Units total) by mouth daily. 90 tablet 1   Continuous Blood Gluc Receiver (FREESTYLE LIBRE 2 READER) DEVI 1 Act by Does not apply route daily. 2 each 5   Continuous Blood Gluc Sensor (FREESTYLE LIBRE 2 SENSOR) MISC USE AS DIRECTED AND REPLACE EVERY 14 DAYS. 2 each 5   COVID-19 mRNA bivalent vaccine, Pfizer, (PFIZER COVID-19 VAC BIVALENT) injection Inject into the muscle. 0.3 mL 0   DULoxetine (CYMBALTA) 60 MG capsule TAKE (1) CAPSULE DAILY. 90 capsule 1   Finerenone (KERENDIA) 20 MG TABS Take 1 tablet by mouth daily. 90 tablet 0   glucagon (GLUCAGEN HYPOKIT) 1 MG SOLR injection Inject 1 mg into the vein once as needed for up to 1 dose for low blood sugar. 2 each 2   Insulin Pen Needle (NOVOFINE) 32G X 6 MM MISC 1 Act by Does not apply route daily. 100 each 1   JARDIANCE 25 MG TABS tablet TAKE 1 TABLET ONCE DAILY BEFORE BREAKFAST. 90 tablet 1   losartan (COZAAR) 100 MG tablet Take 1 tablet (100 mg total) by mouth daily. for high blood pressure 90 tablet 1   Multiple Vitamin (MULTIVITAMIN WITH MINERALS) TABS tablet Take 1 tablet by mouth 2 (two) times a week.      Multiple Vitamins-Minerals (PRESERVISION  AREDS PO) Take by mouth.     MYRBETRIQ 50 MG TB24 tablet TAKE 1 TABLET ONCE DAILY. 90 tablet 1   pravastatin (PRAVACHOL) 40 MG tablet TAKE ONE TABLET BY MOUTH ONCE DAILY. 90 tablet 1   pregabalin (LYRICA) 150 MG capsule Take 150 mg by mouth 3 (three) times daily.     tamsulosin (FLOMAX) 0.4 MG CAPS capsule TAKE (1) CAPSULE DAILY. 90 capsule 1   vitamin B-12 (CYANOCOBALAMIN) 500 MCG tablet Take 500 mcg by mouth daily.     Insulin Glargine-Lixisenatide (SOLIQUA) 100-33 UNT-MCG/ML SOPN Inject 20 Units into the skin daily. 9 mL 1   No facility-administered medications prior to visit.    ROS Review of Systems  Constitutional:  Negative for diaphoresis and fatigue.  HENT: Negative.    Eyes: Negative.   Respiratory:  Negative for cough, chest tightness, shortness of breath and wheezing.   Cardiovascular:  Negative for chest pain, palpitations and leg swelling.  Gastrointestinal:  Negative for abdominal pain, diarrhea, nausea and vomiting.  Endocrine: Negative.  Negative for polydipsia, polyphagia and polyuria.  Genitourinary: Negative.  Negative for difficulty urinating and dysuria.  Musculoskeletal: Negative.  Negative for arthralgias and myalgias.  Skin: Negative.   Neurological:  Negative for dizziness, weakness, light-headedness and numbness.  Hematological:  Negative for adenopathy. Does not bruise/bleed easily.   Objective:  BP 126/76 (BP Location: Left Arm, Patient Position: Sitting, Cuff  Size: Large)    Pulse 72    Temp 97.6 F (36.4 C) (Oral)    Resp 16    Ht 5\' 11"  (1.803 m)    Wt 226 lb (102.5 kg)    SpO2 96%    BMI 31.52 kg/m   BP Readings from Last 3 Encounters:  12/04/21 126/76  10/15/21 (!) 144/90  09/03/21 (!) 164/76    Wt Readings from Last 3 Encounters:  12/04/21 226 lb (102.5 kg)  10/15/21 233 lb (105.7 kg)  09/03/21 231 lb (104.8 kg)    Physical Exam Vitals reviewed.  HENT:     Nose: Nose normal.     Mouth/Throat:     Mouth: Mucous membranes are  moist.  Eyes:     General: No scleral icterus.    Conjunctiva/sclera: Conjunctivae normal.  Cardiovascular:     Rate and Rhythm: Normal rate and regular rhythm.     Heart sounds: Murmur heard.  Systolic murmur is present with a grade of 3/6.  No diastolic murmur is present.    No gallop.  Pulmonary:     Effort: Pulmonary effort is normal.     Breath sounds: Normal breath sounds. No stridor. No wheezing, rhonchi or rales.  Abdominal:     General: Abdomen is flat.     Palpations: There is no mass.     Tenderness: There is no abdominal tenderness. There is no guarding.     Hernia: No hernia is present.  Musculoskeletal:     Cervical back: Neck supple.  Lymphadenopathy:     Cervical: No cervical adenopathy.  Skin:    General: Skin is warm and dry.  Neurological:     General: No focal deficit present.     Mental Status: He is alert.    Lab Results  Component Value Date   WBC 7.7 04/21/2021   HGB 14.8 04/21/2021   HCT 45.4 04/21/2021   PLT 199.0 04/21/2021   GLUCOSE 206 (H) 12/04/2021   CHOL 152 12/04/2021   TRIG 96.0 12/04/2021   HDL 40.20 12/04/2021   LDLDIRECT 81.0 05/26/2018   LDLCALC 93 12/04/2021   ALT 13 12/04/2021   AST 17 12/04/2021   NA 138 12/04/2021   K 3.7 12/04/2021   CL 101 12/04/2021   CREATININE 1.67 (H) 12/04/2021   BUN 30 (H) 12/04/2021   CO2 28 12/04/2021   TSH 2.96 12/04/2021   PSA 1.05 10/28/2015   INR 1.04 09/14/2017   HGBA1C 8.4 (H) 12/04/2021   MICROALBUR 20.2 (H) 12/04/2021    MR LUMBAR SPINE WO CONTRAST  Result Date: 06/29/2019 CLINICAL DATA:  Vertebral compression fractures status post kyphoplasty. Persistent severe back pain with right-sided radiculopathy EXAM: MRI LUMBAR SPINE WITHOUT CONTRAST TECHNIQUE: Multiplanar, multisequence MR imaging of the lumbar spine was performed. No intravenous contrast was administered. COMPARISON:  MRI 04/19/2019 FINDINGS: Segmentation:  Standard. Alignment:  Unchanged. Vertebrae: Chronic vertebral body  compression fracture of L2 status post cement augmentation without change compared to the previous MRI. Interval progression in the degree of vertebral body height loss of a subacute L4 vertebral body compression fracture, now with greater than 50% height loss. Mild bony retropulsion the superior aspect of the L4 vertebral body. There is persistent marrow edema within the vertebral body and extending into the pedicles bilaterally. Low T1/T2 signal centrally within the vertebral body suggesting cement augmentation. No new fracture. Discogenic marrow changes throughout the remaining lumbar levels. Conus medullaris and cauda equina: Conus extends to the T12-L1 level. Conus and  cauda equina appear normal. Paraspinal and other soft tissues: The previously seen bilateral psoas muscle fluid collections have resolved. Remaining paravertebral soft tissues are within normal limits. Disc levels: T12-L1: Mild diffuse disc bulge with left greater than right facet arthrosis. No foraminal or canal stenosis. Unchanged. L1-L2: Mild diffuse disc bulge with bilateral facet arthrosis results in mild canal stenosis without foraminal narrowing. Unchanged. L2-L3: Bony retropulsion from chronic L2 compression fracture which results in moderate canal stenosis and moderate to severe stenosis of the lateral recesses and neural foramen bilaterally. Unchanged. L3-L4: Mild bony retropulsion of the L4 vertebral body, progressed from prior. Mild diffuse disc bulge and bilateral facet arthrosis resulting in moderate canal stenosis with moderate stenosis of the lateral recesses and neural foramen bilaterally. The degree of canal stenosis has slightly progressed secondary to the L4 vertebral retropulsion. L4-L5: No significant disc protrusion. Severe bilateral facet arthrosis results in severe narrowing of the bilateral neural foramen and moderate narrowing of the canal and bilateral subarticular recesses. Unchanged. L5-S1: Bilateral facet  arthropathy without foraminal or canal stenosis. Unchanged. IMPRESSION: 1. Interval progression of L4 vertebral body compression fracture, now with greater than 50% vertebral body height loss. There is also slight increased bony retropulsion this level resulting in moderate canal stenosis, slightly progressed from prior. 2. Low signal changes within the L4 vertebral body, favored to represent interval cement augmentation. Correlate with interval procedural history. 3. Interval resolution of previously seen bilateral psoas muscle fluid collections, likely resolved intramuscular hematomas. 4. 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 5. Multilevel lumbar spondylosis at the remaining vertebral levels including moderate canal stenosis at L2-3 and L4-5, not significantly progressed compared to the prior MRI 04/18/2019. Electronically Signed   By: Davina Poke M.D.   On: 06/29/2019 10:09    Assessment & Plan:   Jailon was seen today for diabetes.  Diagnoses and all orders for this visit:  Stage 3a chronic kidney disease ([place])- His renal function has declined slightly.  Will try to get better control of his blood sugar. -     [facility] metabolic panel; Future -     Microalbumin / creatinine urine ratio; Future -     Urinalysis, Routine w reflex microscopic; Future -     Urinalysis, Routine w reflex microscopic -     Microalbumin / creatinine urine ratio -     [facility] metabolic panel  Essential hypertension- His blood pressure is adequately well controlled. -     TSH; Future -     Urinalysis, Routine w reflex microscopic; Future -     Urinalysis, Routine w reflex microscopic -     TSH  Type II diabetes mellitus with manifestations ([place])- I have asked him to slowly increase the dose of Soliqua to keep his blood sugars less than 200. -     Insulin Glargine-Lixisenatide (SOLIQUA) 100-33 UNT-MCG/ML SOPN; Inject 60 Units into the skin daily. -     Microalbumin /  creatinine urine ratio; Future -     Hemoglobin A1c; Future -     Hemoglobin A1c -     Microalbumin / creatinine urine ratio  Hyperlipidemia with target LDL less than 100- LDL goal achieved. Doing well on the statin  -     Lipid panel; Future -     Hepatic function panel; Future -     TSH; Future -     TSH -     Hepatic function panel -     Lipid panel  I have changed [name]. [facility]'s Soliqua. I am also having him maintain his multivitamin with minerals, Cholecalciferol, Multiple Vitamins-Minerals (PRESERVISION AREDS PO), vitamin B-12, pregabalin, NovoFine, Myrbetriq, FreeStyle Libre 2 Reader, [employer]uca[employer] HypoKit, tamsulosin, pravastatin, atenolol-chlorthalidone, losartan, DULoxetine, FreeStyle [employer], [employer] COVID-19 Vac Bivalent, [place], and [place].  Meds ordered this encounter  Medications   Insulin Glargine-Lixisenatide (SOLIQUA) 100-33 UNT-MCG/ML SOPN    Sig: Inject 60 Units into the skin daily.    Dispense:  54 mL    Refill:  1     Follow-up: Return in about 3 months (around 03/04/2022).  [name], MD

## 2021-12-09 ENCOUNTER — Other Ambulatory Visit: Payer: Self-pay | Admitting: Internal Medicine

## 2022-01-16 DIAGNOSIS — H353132 Nonexudative age-related macular degeneration, bilateral, intermediate dry stage: Secondary | ICD-10-CM | POA: Diagnosis not present

## 2022-01-16 DIAGNOSIS — Z794 Long term (current) use of insulin: Secondary | ICD-10-CM | POA: Diagnosis not present

## 2022-01-16 DIAGNOSIS — Z961 Presence of intraocular lens: Secondary | ICD-10-CM | POA: Diagnosis not present

## 2022-01-16 DIAGNOSIS — E119 Type 2 diabetes mellitus without complications: Secondary | ICD-10-CM | POA: Diagnosis not present

## 2022-01-21 DIAGNOSIS — D229 Melanocytic nevi, unspecified: Secondary | ICD-10-CM | POA: Diagnosis not present

## 2022-01-21 DIAGNOSIS — Z08 Encounter for follow-up examination after completed treatment for malignant neoplasm: Secondary | ICD-10-CM | POA: Diagnosis not present

## 2022-01-21 DIAGNOSIS — L578 Other skin changes due to chronic exposure to nonionizing radiation: Secondary | ICD-10-CM | POA: Diagnosis not present

## 2022-01-21 DIAGNOSIS — L570 Actinic keratosis: Secondary | ICD-10-CM | POA: Diagnosis not present

## 2022-02-03 ENCOUNTER — Other Ambulatory Visit: Payer: Self-pay | Admitting: Internal Medicine

## 2022-02-03 DIAGNOSIS — N1831 Chronic kidney disease, stage 3a: Secondary | ICD-10-CM

## 2022-02-03 DIAGNOSIS — E118 Type 2 diabetes mellitus with unspecified complications: Secondary | ICD-10-CM

## 2022-02-13 ENCOUNTER — Other Ambulatory Visit: Payer: Self-pay | Admitting: Internal Medicine

## 2022-03-02 ENCOUNTER — Ambulatory Visit (INDEPENDENT_AMBULATORY_CARE_PROVIDER_SITE_OTHER): Payer: Medicare Other

## 2022-03-02 DIAGNOSIS — Z0000 Encounter for general adult medical examination without abnormal findings: Secondary | ICD-10-CM | POA: Diagnosis not present

## 2022-03-02 NOTE — Progress Notes (Signed)
? ?Subjective:  ? Ryan Duke is a 81 y.o. male who presents for an Subsequent  Medicare Annual Wellness Visit. ? ?I connected with Ryan Duke today by telephone and verified that I am speaking with the correct person using two identifiers. ?Location patient: home ?Location provider: work ?Persons participating in the virtual visit: patient, provider. ?  ?I discussed the limitations, risks, security and privacy concerns of performing an evaluation and management service by telephone and the availability of in person appointments. I also discussed with the patient that there may be a patient responsible charge related to this service. The patient expressed understanding and verbally consented to this telephonic visit.  ?  ?Interactive audio and video telecommunications were attempted between this provider and patient, however failed, due to patient having technical difficulties OR patient did not have access to video capability.  We continued and completed visit with audio only. ? ?  ?Review of Systems    ? ?Cardiac Risk Factors include: advanced age (>45mn, >>57women);diabetes mellitus;dyslipidemia;male gender;hypertension ? ?   ?Objective:  ?  ?Today's Vitals  ? ?There is no height or weight on file to calculate BMI. ? ? ?  03/02/2022  ?  2:54 PM 02/28/2020  ?  2:21 PM 01/27/2019  ?  1:51 PM 01/24/2019  ?  7:49 PM 01/19/2019  ?  9:46 AM 09/10/2017  ? 12:44 PM 09/05/2017  ?  8:47 PM  ?Advanced Directives  ?Does Patient Have a Medical Advance Directive? Yes Yes Yes Yes Yes Yes Yes  ?Type of AParamed[place]of [place]Living will Living will;Healthcare Power of [place]Living will [place] will [place]Living will Healthcare Power of [place] ?Does patient want to make changes to medical advance directive?  No - Patient declined     No - Patient declined  ?Copy of [place]in Chart? No - copy requested No - copy requested   No - copy requested No - copy requested No - copy requested  ? ? ?Current Medications (verified) ?Outpatient Encounter Medications as of 03/02/2022  ?Medication Sig  ? atenolol-chlorthalidone (TENORETIC) 100-25 MG tablet TAKE ONE TABLET BY MOUTH ONCE DAILY  ? Cholecalciferol 50 MCG (2000 UT) TABS Take 1 tablet (2,000 Units total) by mouth daily.  ? Continuous Blood Gluc Receiver (FREESTYLE LIBRE 2 READER) DEVI 1 Act by Does not apply route daily.  ? Continuous Blood Gluc Sensor (FREESTYLE LIBRE 2 SENSOR) MISC USE AS DIRECTED AND REPLACE EVERY 14 DAYS.  ? COVID-19 mRNA bivalent vaccine, Pfizer, (PFIZER COVID-19 VAC BIVALENT) injection Inject into the muscle.  ? DULoxetine (CYMBALTA) 60 MG capsule TAKE (1) CAPSULE DAILY.  ? glucagon (GLUCAGEN HYPOKIT) 1 MG SOLR injection Inject 1 mg into the vein once as needed for up to 1 dose for low blood sugar.  ? Insulin Glargine-Lixisenatide (SOLIQUA) 100-33 UNT-MCG/ML SOPN Inject 60 Units into the skin daily.  ? Insulin Pen Needle (NOVOFINE) 32G X 6 MM MISC 1 Act by Does not apply route daily.  ? JARDIANCE 25 MG TABS tablet TAKE 1 TABLET ONCE DAILY BEFORE BREAKFAST.  ? KERENDIA 20 MG TABS Take 1 tablet by mouth daily.  ? losartan (COZAAR) 100 MG tablet Take 1 tablet (100 mg total) by mouth daily. for high blood pressure  ? Multiple Vitamin (MULTIVITAMIN WITH MINERALS) TABS tablet Take 1 tablet by mouth 2 (two) times a week.   ? Multiple Vitamins-Minerals (PRESERVISION AREDS  PO) Take by mouth.  ? MYRBETRIQ 50 MG TB24 tablet TAKE 1 TABLET ONCE DAILY.  ? pravastatin (PRAVACHOL) 40 MG tablet TAKE ONE TABLET BY MOUTH ONCE DAILY.  ? pregabalin (LYRICA) 150 MG capsule Take 150 mg by mouth 3 (three) times daily.  ? tamsulosin (FLOMAX) 0.4 MG CAPS capsule TAKE ONE CAPSULE BY MOUTH DAILY  ? vitamin B-12 (CYANOCOBALAMIN) 500 MCG tablet Take 500 mcg by mouth daily.  ? ?No facility-administered encounter medications on file as of  03/02/2022.  ? ? ?Allergies (verified) ?Fentanyl, Farxiga [dapagliflozin], Metformin and related, Atorvastatin, Benazepril hcl, Flexeril [cyclobenzaprine], and Oxycodone  ? ?History: ?Past Medical History:  ?Diagnosis Date  ? Diabetes mellitus   ? type 2  ? Hyperlipidemia   ? Hypertension   ? Hypogonadism, male   ? Pancreatitis 2008  ? Renal insufficiency   ? ?Past Surgical History:  ?Procedure Laterality Date  ? EYE SURGERY    ? cataracts  ? FLEXIBLE SIGMOIDOSCOPY N/A 09/10/2017  ? Procedure: FLEXIBLE SIGMOIDOSCOPY;  Surgeon: Ryan Pole, MD;  Location: [facility];  Service: Endoscopy;  Laterality: N/A;  ? IR KYPHO LUMBAR INC FX REDUCE BONE BX UNI/BIL CANNULATION INC/IMAGING  09/14/2017  ? IR RADIOLOGIST EVAL & MGMT  10/07/2017  ? ?Family History  ?Problem Relation Age of Onset  ? Hypertension Other   ? ?Social History  ? ?Socioeconomic History  ? Marital status: Married  ?  Spouse name: Not on file  ? Number of children: Not on file  ? Years of education: Not on file  ? Highest education level: Not on file  ?Occupational History  ? Occupation: retired  ?Tobacco Use  ? Smoking status: Former  ?  Types: Cigarettes  ?  Quit date: 11/30/1974  ?  Years since quitting: 60.2  ? Smokeless tobacco: Never  ?Vaping Use  ? Vaping Use: Never used  ?Substance and Sexual Activity  ? Alcohol use: Not Currently  ?  Alcohol/week: 0.0 standard drinks  ? Drug use: No  ? Sexual activity: Yes  ?Other Topics Concern  ? Not on file  ?Social History Narrative  ? Regular exercise-yes  ? ?Social Determinants of Health  ? ?Financial Resource Strain: Low Risk   ? Difficulty of Paying Living Expenses: Not hard at all  ?Food Insecurity: No Food Insecurity  ? Worried About Charity fundraiser in the Last Year: Never true  ? Ran Out of Food in the Last Year: Never true  ?Transportation Needs: No Transportation Needs  ? Lack of Transportation (Medical): No  ? Lack of Transportation (Non-Medical): No  ?Physical Activity: Insufficiently Active   ? Days of Exercise per Week: 2 days  ? Minutes of Exercise per Session: 20 min  ?Stress: No Stress Concern Present  ? Feeling of Stress : Not at all  ?Social Connections: Moderately Isolated  ? Frequency of Communication with Friends and Family: Twice a week  ? Frequency of Social Gatherings with Friends and Family: Twice a week  ? Attends Religious Services: Never  ? Active Member of Clubs or Organizations: No  ? Attends Archivist Meetings: Never  ? Marital Status: Married  ? ? ?Tobacco Counseling ?Counseling given: Not Answered ? ? ?Clinical Intake: ? ?Pre-visit preparation completed: Yes ? ?Pain : No/denies pain ? ?  ? ?Nutritional Risks: None ?Diabetes: Yes ?CBG done?: No ?Did pt. bring in CBG monitor from home?: No ? ?How often do you need to have someone help you when you read instructions, pamphlets, or  other written materials from your doctor or pharmacy?: 1 - Never ?What is the last grade level you completed in school?: college ? ?Diabetic?yes ?Nutrition Risk Assessment: ? ?Has the patient had any N/V/D within the last 2 months?  No  ?Does the patient have any non-healing wounds?  No  ?Has the patient had any unintentional weight loss or weight gain?  No  ? ?Diabetes: ? ?Is the patient diabetic?  Yes  ?If diabetic, was a CBG obtained today?  No  ?Did the patient bring in their glucometer from home?  No  ?How often do you monitor your CBG's? 2 x days.  ? ?Financial Strains and Diabetes Management: ? ?Are you having any financial strains with the device, your supplies or your medication? No .  ?Does the patient want to be seen by Chronic Care Management for management of their diabetes?  No  ?Would the patient like to be referred to a Nutritionist or for Diabetic Management?  No  ? ?Diabetic Exams: ?12/2021 ?Diabetic Eye Exam: Completed 12/2021 ?Diabetic Foot Exam: Overdue, Pt has been advised about the importance in completing this exam. Pt is scheduled for diabetic foot exam on next office visit  .  ? ?Interpreter Needed?: No ? ?Information entered by :: [email or web address] ? ? ?Activities of Daily Living ? ?  03/02/2022  ?  2:55 PM 04/21/2021  ? 11:04 AM  ?In your present state of health, do you have any diffi

## 2022-03-02 NOTE — Patient Instructions (Signed)
Ryan Duke , ?Thank you for taking time to come for your Medicare Wellness Visit. I appreciate your ongoing commitment to your health goals. Please review the following plan we discussed and let me know if I can assist you in the future.  ? ?Screening recommendations/referrals: ?Colonoscopy: no longer required  ?Recommended yearly ophthalmology/optometry visit for glaucoma screening and checkup ?Recommended yearly dental visit for hygiene and checkup ? ?Vaccinations: ?Influenza vaccine: completed  ?Pneumococcal vaccine: completed  ?Tdap vaccine: 04/21/2021 ?Shingles vaccine: completed    ? ?Advanced directives: yes  ? ?Conditions/risks identified: none  ? ?Next appointment: none  ? ?Preventive Care 24 Years and Older, Male ?Preventive care refers to lifestyle choices and visits with your health care provider that can promote health and wellness. ?What does preventive care include? ?A yearly physical exam. This is also called an annual well check. ?Dental exams once or twice a year. ?Routine eye exams. Ask your health care provider how often you should have your eyes checked. ?Personal lifestyle choices, including: ?Daily care of your teeth and gums. ?Regular physical activity. ?Eating a healthy diet. ?Avoiding tobacco and drug use. ?Limiting alcohol use. ?Practicing safe sex. ?Taking low doses of aspirin every day. ?Taking vitamin and mineral supplements as recommended by your health care provider. ?What happens during an annual well check? ?The services and screenings done by your health care provider during your annual well check will depend on your age, overall health, lifestyle risk factors, and family history of disease. ?Counseling  ?Your health care provider may ask you questions about your: ?Alcohol use. ?Tobacco use. ?Drug use. ?Emotional well-being. ?Home and relationship well-being. ?Sexual activity. ?Eating habits. ?History of falls. ?Memory and ability to understand (cognition). ?Work and work  Statistician. ?Screening  ?You may have the following tests or measurements: ?Height, weight, and BMI. ?Blood pressure. ?Lipid and cholesterol levels. These may be checked every 5 years, or more frequently if you are over 39 years old. ?Skin check. ?Lung cancer screening. You may have this screening every year starting at age 45 if you have a 30-pack-year history of smoking and currently smoke or have quit within the past 15 years. ?Fecal occult blood test (FOBT) of the stool. You may have this test every year starting at age 81. ?Flexible sigmoidoscopy or colonoscopy. You may have a sigmoidoscopy every 5 years or a colonoscopy every 10 years starting at age 31. ?Prostate cancer screening. Recommendations will vary depending on your family history and other risks. ?Hepatitis C blood test. ?Hepatitis B blood test. ?Sexually transmitted disease (STD) testing. ?Diabetes screening. This is done by checking your blood sugar (glucose) after you have not eaten for a while (fasting). You may have this done every 1-3 years. ?Abdominal aortic aneurysm (AAA) screening. You may need this if you are a current or former smoker. ?Osteoporosis. You may be screened starting at age 3 if you are at high risk. ?Talk with your health care provider about your test results, treatment options, and if necessary, the need for more tests. ?Vaccines  ?Your health care provider may recommend certain vaccines, such as: ?Influenza vaccine. This is recommended every year. ?Tetanus, diphtheria, and acellular pertussis (Tdap, Td) vaccine. You may need a Td booster every 10 years. ?Zoster vaccine. You may need this after age 5. ?Pneumococcal 13-valent conjugate (PCV13) vaccine. One dose is recommended after age 33. ?Pneumococcal polysaccharide (PPSV23) vaccine. One dose is recommended after age 15. ?Talk to your health care provider about which screenings and vaccines you need and how  often you need them. ?This information is not intended to replace  advice given to you by your health care provider. Make sure you discuss any questions you have with your health care provider. ?Document Released: 12/13/2015 Document Revised: 08/05/2016 Document Reviewed: 09/17/2015 ?Elsevier Interactive Patient Education ? 2017 [place]. ? ?Fall Prevention in the Home ?Falls can cause injuries. They can happen to people of all ages. There are many things you can do to make your home safe and to help prevent falls. ?What can I do on the outside of my home? ?Regularly fix the edges of walkways and driveways and fix any cracks. ?Remove anything that might make you trip as you walk through a door, such as a raised step or threshold. ?Trim any bushes or trees on the path to your home. ?Use bright outdoor lighting. ?Clear any walking paths of anything that might make someone trip, such as rocks or tools. ?Regularly check to see if handrails are loose or broken. Make sure that both sides of any steps have handrails. ?Any raised decks and porches should have guardrails on the edges. ?Have any leaves, snow, or ice cleared regularly. ?Use sand or salt on walking paths during winter. ?Clean up any spills in your garage right away. This includes oil or grease spills. ?What can I do in the bathroom? ?Use night lights. ?Install grab bars by the toilet and in the tub and shower. Do not use towel bars as grab bars. ?Use non-skid mats or decals in the tub or shower. ?If you need to sit down in the shower, use a plastic, non-slip stool. ?Keep the floor dry. Clean up any water that spills on the floor as soon as it happens. ?Remove soap buildup in the tub or shower regularly. ?Attach bath mats securely with double-sided non-slip rug tape. ?Do not have throw rugs and other things on the floor that can make you trip. ?What can I do in the bedroom? ?Use night lights. ?Make sure that you have a light by your bed that is easy to reach. ?Do not use any sheets or blankets that are too big for your bed.  They should not hang down onto the floor. ?Have a firm chair that has side arms. You can use this for support while you get dressed. ?Do not have throw rugs and other things on the floor that can make you trip. ?What can I do in the kitchen? ?Clean up any spills right away. ?Avoid walking on wet floors. ?Keep items that you use a lot in easy-to-reach places. ?If you need to reach something above you, use a strong step stool that has a grab bar. ?Keep electrical cords out of the way. ?Do not use floor polish or wax that makes floors slippery. If you must use wax, use non-skid floor wax. ?Do not have throw rugs and other things on the floor that can make you trip. ?What can I do with my stairs? ?Do not leave any items on the stairs. ?Make sure that there are handrails on both sides of the stairs and use them. Fix handrails that are broken or loose. Make sure that handrails are as long as the stairways. ?Check any carpeting to make sure that it is firmly attached to the stairs. Fix any carpet that is loose or worn. ?Avoid having throw rugs at the top or bottom of the stairs. If you do have throw rugs, attach them to the floor with carpet tape. ?Make sure that you have a  light switch at the top of the stairs and the bottom of the stairs. If you do not have them, ask someone to add them for you. ?What else can I do to help prevent falls? ?Wear shoes that: ?Do not have high heels. ?Have rubber bottoms. ?Are comfortable and fit you well. ?Are closed at the toe. Do not wear sandals. ?If you use a stepladder: ?Make sure that it is fully opened. Do not climb a closed stepladder. ?Make sure that both sides of the stepladder are locked into place. ?Ask someone to hold it for you, if possible. ?Clearly mark and make sure that you can see: ?Any grab bars or handrails. ?First and last steps. ?Where the edge of each step is. ?Use tools that help you move around (mobility aids) if they are needed. These  include: ?Canes. ?Walkers. ?Scooters. ?Crutches. ?Turn on the lights when you go into a dark area. Replace any light bulbs as soon as they burn out. ?Set up your furniture so you have a clear path. Avoid moving your furniture around. ?If

## 2022-03-04 LAB — HM DIABETES EYE EXAM

## 2022-03-10 ENCOUNTER — Other Ambulatory Visit: Payer: Self-pay | Admitting: Internal Medicine

## 2022-03-10 ENCOUNTER — Ambulatory Visit (INDEPENDENT_AMBULATORY_CARE_PROVIDER_SITE_OTHER): Payer: Medicare Other | Admitting: Internal Medicine

## 2022-03-10 VITALS — BP 132/80 | HR 55 | Temp 97.9°F | Resp 16 | Ht 71.0 in | Wt 232.0 lb

## 2022-03-10 DIAGNOSIS — E118 Type 2 diabetes mellitus with unspecified complications: Secondary | ICD-10-CM

## 2022-03-10 DIAGNOSIS — N1831 Chronic kidney disease, stage 3a: Secondary | ICD-10-CM

## 2022-03-10 DIAGNOSIS — I10 Essential (primary) hypertension: Secondary | ICD-10-CM

## 2022-03-10 LAB — CBC WITH DIFFERENTIAL/PLATELET
Basophils Absolute: 0.1 10*3/uL (ref 0.0–0.1)
Basophils Relative: 1 % (ref 0.0–3.0)
Eosinophils Absolute: 0.3 10*3/uL (ref 0.0–0.7)
Eosinophils Relative: 4 % (ref 0.0–5.0)
HCT: 48.2 % (ref 39.0–52.0)
Hemoglobin: 15.9 g/dL (ref 13.0–17.0)
Lymphocytes Relative: 27.5 % (ref 12.0–46.0)
Lymphs Abs: 2.2 10*3/uL (ref 0.7–4.0)
MCHC: 33 g/dL (ref 30.0–36.0)
MCV: 91 fl (ref 78.0–100.0)
Monocytes Absolute: 0.7 10*3/uL (ref 0.1–1.0)
Monocytes Relative: 9.2 % (ref 3.0–12.0)
Neutro Abs: 4.7 10*3/uL (ref 1.4–7.7)
Neutrophils Relative %: 58.3 % (ref 43.0–77.0)
Platelets: 213 10*3/uL (ref 150.0–400.0)
RBC: 5.3 Mil/uL (ref 4.22–5.81)
RDW: 14.6 % (ref 11.5–15.5)
WBC: 8 10*3/uL (ref 4.0–10.5)

## 2022-03-10 LAB — BASIC METABOLIC PANEL
BUN: 27 mg/dL — ABNORMAL HIGH (ref 6–23)
CO2: 32 mEq/L (ref 19–32)
Calcium: 9.6 mg/dL (ref 8.4–10.5)
Chloride: 102 mEq/L (ref 96–112)
Creatinine, Ser: 1.54 mg/dL — ABNORMAL HIGH (ref 0.40–1.50)
GFR: 41.59 mL/min — ABNORMAL LOW (ref >60.00)
Glucose, Bld: 63 mg/dL — ABNORMAL LOW (ref 70–99)
Potassium: 4.2 mEq/L (ref 3.5–5.1)
Sodium: 140 mEq/L (ref 135–145)

## 2022-03-10 LAB — HEMOGLOBIN A1C: Hgb A1c MFr Bld: 8.1 % — ABNORMAL HIGH (ref 4.6–6.5)

## 2022-03-10 NOTE — Progress Notes (Signed)
? ? ?Subjective:  ?Patient ID: Ryan Duke, male    DOB: 1940-12-01  Age: 81 y.o. MRN: [id number] ? ?CC: Hypertension and Diabetes ? ? ?HPI ?Ryan Duke presents for f/up - ? ?He admits that he is not losing weight because he has trouble controlling his appetite.  He likes to eat sweets like chocolate cookies and pound cake.  He denies chest pain, shortness of breath, palpitations, dizziness, lightheadedness, or near syncope. ? ?Outpatient Medications Prior to Visit  ?Medication Sig Dispense Refill  ? atenolol-chlorthalidone (TENORETIC) 100-25 MG tablet TAKE ONE TABLET BY MOUTH ONCE DAILY 90 tablet 1  ? Continuous Blood Gluc Receiver (FREESTYLE LIBRE 2 READER) DEVI 1 Act by Does not apply route daily. 2 each 5  ? Continuous Blood Gluc Sensor (FREESTYLE LIBRE 2 SENSOR) MISC USE AS DIRECTED AND REPLACE EVERY 14 DAYS. 2 each 5  ? COVID-19 mRNA bivalent vaccine, Pfizer, (PFIZER COVID-19 VAC BIVALENT) injection Inject into the muscle. 0.3 mL 0  ? DULoxetine (CYMBALTA) 60 MG capsule TAKE (1) CAPSULE DAILY. 90 capsule 1  ? Insulin Glargine-Lixisenatide (SOLIQUA) 100-33 UNT-MCG/ML SOPN Inject 60 Units into the skin daily. 54 mL 1  ? Insulin Pen Needle (NOVOFINE) 32G X 6 MM MISC 1 Act by Does not apply route daily. 100 each 1  ? JARDIANCE 25 MG TABS tablet TAKE 1 TABLET ONCE DAILY BEFORE BREAKFAST. 90 tablet 1  ? KERENDIA 20 MG TABS Take 1 tablet by mouth daily. 90 tablet 0  ? losartan (COZAAR) 100 MG tablet Take 1 tablet (100 mg total) by mouth daily. for high blood pressure 90 tablet 1  ? MYRBETRIQ 50 MG TB24 tablet TAKE 1 TABLET ONCE DAILY. 90 tablet 1  ? pravastatin (PRAVACHOL) 40 MG tablet TAKE ONE TABLET BY MOUTH ONCE DAILY. 90 tablet 1  ? pregabalin (LYRICA) 150 MG capsule Take 150 mg by mouth 3 (three) times daily.    ? tamsulosin (FLOMAX) 0.4 MG CAPS capsule TAKE ONE CAPSULE BY MOUTH DAILY 90 capsule 1  ? vitamin B-12 (CYANOCOBALAMIN) 500 MCG tablet Take 500 mcg by mouth daily.    ? Cholecalciferol 50 MCG  (2000 UT) TABS Take 1 tablet (2,000 Units total) by mouth daily. (Patient not taking: Reported on 03/10/2022) 90 tablet 1  ? glucagon (GLUCAGEN HYPOKIT) 1 MG SOLR injection Inject 1 mg into the vein once as needed for up to 1 dose for low blood sugar. (Patient not taking: Reported on 03/10/2022) 2 each 2  ? Multiple Vitamin (MULTIVITAMIN WITH MINERALS) TABS tablet Take 1 tablet by mouth 2 (two) times a week.  (Patient not taking: Reported on 03/10/2022)    ? Multiple Vitamins-Minerals (PRESERVISION AREDS PO) Take by mouth. (Patient not taking: Reported on 03/10/2022)    ? ?No facility-administered medications prior to visit.  ? ? ?ROS ?Review of Systems  ?Constitutional: Negative.  Negative for diaphoresis and fatigue.  ?HENT: Negative.    ?Eyes: Negative.   ?Respiratory:  Negative for cough, chest tightness, shortness of breath and wheezing.   ?Cardiovascular:  Negative for chest pain, palpitations and leg swelling.  ?Gastrointestinal:  Negative for abdominal pain, constipation and diarrhea.  ?Endocrine: Negative.  Negative for polydipsia, polyphagia and polyuria.  ?Genitourinary: Negative.  Negative for difficulty urinating.  ?Musculoskeletal:  Positive for arthralgias. Negative for myalgias.  ?Skin: Negative.   ?Neurological:  Negative for dizziness, weakness and light-headedness.  ?Hematological:  Negative for adenopathy. Does not bruise/bleed easily.  ?Psychiatric/Behavioral: Negative.    ? ?Objective:  ?BP 132/80 (BP  Location: Right Arm, Patient Position: Sitting, Cuff Size: Large)   Pulse (!) 55   Temp 97.9 ?F (36.6 ?C) (Oral)   Ht '5\' 11"'$  (1.803 m)   Wt 232 lb (105.2 kg)   SpO2 94%   BMI 32.36 kg/m?  ? ?BP Readings from Last 3 Encounters:  ?03/10/22 132/80  ?12/04/21 126/76  ?10/15/21 (!) 144/90  ? ? ?Wt Readings from Last 3 Encounters:  ?03/10/22 232 lb (105.2 kg)  ?12/04/21 226 lb (102.5 kg)  ?10/15/21 233 lb (105.7 kg)  ? ? ?Physical Exam ?Vitals reviewed.  ?Constitutional:   ?   Appearance: He is obese.   ?HENT:  ?   Nose: Nose normal.  ?   Mouth/Throat:  ?   Mouth: Mucous membranes are moist.  ?Eyes:  ?   General: No scleral icterus. ?   Conjunctiva/sclera: Conjunctivae normal.  ?Cardiovascular:  ?   Rate and Rhythm: Normal rate and regular rhythm.  ?   Heart sounds: Murmur heard.  ?Systolic murmur is present with a grade of 3/6.  ?  No friction rub. No gallop.  ?   Comments: 3/6 harsh SEM RUSB ?Pulmonary:  ?   Effort: Pulmonary effort is normal.  ?   Breath sounds: No stridor. No wheezing, rhonchi or rales.  ?Abdominal:  ?   General: Abdomen is protuberant. Bowel sounds are normal. There is no distension.  ?   Palpations: Abdomen is soft. There is no hepatomegaly, splenomegaly or mass.  ?   Tenderness: There is no abdominal tenderness. There is no guarding.  ?Musculoskeletal:  ?   Cervical back: Neck supple.  ?   Right lower leg: Edema (trace pitting) present.  ?   Left lower leg: Edema (trace pitting) present.  ?Skin: ?   General: Skin is warm and dry.  ?Neurological:  ?   General: No focal deficit present.  ?   Mental Status: He is alert. Mental status is at baseline.  ?Psychiatric:     ?   Mood and Affect: Mood normal.     ?   Behavior: Behavior normal.  ? ? ?Lab Results  ?Component Value Date  ? WBC 8.0 03/10/2022  ? HGB 15.9 03/10/2022  ? HCT 48.2 03/10/2022  ? PLT 213.0 03/10/2022  ? GLUCOSE 63 (L) 03/10/2022  ? CHOL 152 12/04/2021  ? TRIG 96.0 12/04/2021  ? HDL 40.20 12/04/2021  ? LDLDIRECT 81.0 05/26/2018  ? [place] 93 12/04/2021  ? ALT 13 12/04/2021  ? AST 17 12/04/2021  ? NA 140 03/10/2022  ? K 4.2 03/10/2022  ? CL 102 03/10/2022  ? CREATININE 1.54 (H) 03/10/2022  ? BUN 27 (H) 03/10/2022  ? CO2 32 03/10/2022  ? TSH 2.96 12/04/2021  ? PSA 1.05 10/28/2015  ? INR 1.04 09/14/2017  ? HGBA1C 8.1 (H) 03/10/2022  ? MICROALBUR 20.2 (H) 12/04/2021  ? ? ?MR LUMBAR SPINE WO CONTRAST ? ?Result Date: 06/29/2019 ?CLINICAL DATA:  Vertebral compression fractures status post kyphoplasty. Persistent severe back pain with  right-sided radiculopathy EXAM: MRI LUMBAR SPINE WITHOUT CONTRAST TECHNIQUE: Multiplanar, multisequence MR imaging of the lumbar spine was performed. No intravenous contrast was administered. COMPARISON:  MRI 04/19/2019 FINDINGS: Segmentation:  Standard. Alignment:  Unchanged. 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 Interval progression in the degree of vertebral body height loss of a subacute L4 vertebral body compression fracture, now with greater than 50% height loss. Mild bony retropulsion the superior aspect  of the L4 vertebral body. There is persistent marrow edema within the vertebral body and extending into the pedicles bilaterally. Low T1/T2 signal centrally within the vertebral body suggesting cement augmentation. No new fracture. Discogenic marrow changes throughout the remaining lumbar levels. Conus medullaris and cauda equina: Conus extends to the T12-L1 level. Conus and cauda equina appear normal. Paraspinal and other soft tissues: The previously seen bilateral psoas muscle fluid collections have resolved. Remaining paravertebral soft tissues are within normal limits. Disc levels: T12-L1: Mild diffuse disc bulge with left greater than right facet arthrosis. No foraminal or canal stenosis. Unchanged. L1-L2: Mild diffuse disc bulge with bilateral facet arthrosis results in mild canal stenosis without foraminal narrowing. Unchanged. L2-L3: Bony retropulsion from chronic L2 compression fracture which results in moderate canal stenosis and moderate to severe stenosis of the lateral recesses and neural foramen bilaterally. Unchanged. L3-L4: Mild bony retropulsion of the L4 vertebral body, progressed from prior. Mild diffuse disc bulge and bilateral facet arthrosis resulting in moderate canal stenosis with moderate stenosis of the lateral recesses and neural foramen bilaterally. The degree of canal stenosis has  slightly progressed secondary to the L4 vertebral retropulsion. L4-L5: No significant disc protrusion. Severe bilateral facet arthrosis results in severe narrowing of the bilateral neural foramen and moderate na

## 2022-03-11 ENCOUNTER — Encounter: Payer: Self-pay | Admitting: Internal Medicine

## 2022-03-11 ENCOUNTER — Other Ambulatory Visit: Payer: Self-pay | Admitting: Internal Medicine

## 2022-03-11 DIAGNOSIS — I10 Essential (primary) hypertension: Secondary | ICD-10-CM

## 2022-03-11 NOTE — Patient Instructions (Signed)
Type 2 Diabetes Mellitus, Diagnosis, Adult ?Type 2 diabetes (type 2 diabetes mellitus) is a long-term, or chronic, disease. In type 2 diabetes, one or both of these problems may be present: ?The pancreas does not make enough of a hormone called insulin. ?Cells in the body do not respond properly to the insulin that the body makes (insulin resistance). ?Normally, insulin allows blood sugar (glucose) to enter cells in the body. The cells use glucose for energy. Insulin resistance or lack of insulin causes excess glucose to build up in the blood instead of going into cells. This causes high blood glucose (hyperglycemia).  ?What are the causes? ?The exact cause of type 2 diabetes is not known. ?What increases the risk? ?The following factors may make you more likely to develop this condition: ?Having a family member with type 2 diabetes. ?Being overweight or obese. ?Being inactive (sedentary). ?Having been diagnosed with insulin resistance. ?Having a history of prediabetes, diabetes when you were pregnant (gestational diabetes), or polycystic ovary syndrome (PCOS). ?What are the signs or symptoms? ?In the early stage of this condition, you may not have symptoms. Symptoms develop slowly and may include: ?Increased thirst or hunger. ?Increased urination. ?Unexplained weight loss. ?Tiredness (fatigue) or weakness. ?Vision changes, such as blurry vision. ?Dark patches on the skin. ?How is this diagnosed? ?This condition is diagnosed based on your symptoms, your medical history, a physical exam, and your blood glucose level. Your blood glucose may be checked with one or more of the following blood tests: ?A fasting blood glucose (FBG) test. You will not be allowed to eat (you will fast) for 8 hours or longer before a blood sample is taken. ?A random blood glucose test. This test checks blood glucose at any time of day regardless of when you ate. ?An A1C (hemoglobin A1C) blood test. This test provides information about blood  glucose levels over the previous 2-3 months. ?An oral glucose tolerance test (OGTT). This test measures your blood glucose at two times: ?After fasting. This is your baseline blood glucose level. ?Two hours after drinking a beverage that contains glucose. ?You may be diagnosed with type 2 diabetes if: ?Your fasting blood glucose level is 126 mg/dL (7.0 mmol/L) or higher. ?Your random blood glucose level is 200 mg/dL (11.1 mmol/L) or higher. ?Your A1C level is 6.5% or higher. ?Your oral glucose tolerance test result is higher than 200 mg/dL (11.1 mmol/L). ?These blood tests may be repeated to confirm your diagnosis. ?How is this treated? ?Your treatment may be managed by a specialist called an endocrinologist. Type 2 diabetes may be treated by following instructions from your health care provider about: ?Making dietary and lifestyle changes. These may include: ?Following a personalized nutrition plan that is developed by a registered dietitian. ?Exercising regularly. ?Finding ways to manage stress. ?Checking your blood glucose level as often as told. ?Taking diabetes medicines or insulin daily. This helps to keep your blood glucose levels in the healthy range. ?Taking medicines to help prevent complications from diabetes. Medicines may include: ?Aspirin. ?Medicine to lower cholesterol. ?Medicine to control blood pressure. ?Your health care provider will set treatment goals for you. Your goals will be based on your age, other medical conditions you have, and how you respond to diabetes treatment. Generally, the goal of treatment is to maintain the following blood glucose levels: ?Before meals: 80-130 mg/dL (4.4-7.2 mmol/L). ?After meals: below 180 mg/dL (10 mmol/L). ?A1C level: less than 7%. ?Follow these instructions at home: ?Questions to ask your health care provider ?  Consider asking the following questions: ?Should I meet with a certified diabetes care and education specialist? ?What diabetes medicines do I need,  and when should I take them? ?What equipment will I need to manage my diabetes at home? ?How often do I need to check my blood glucose? ?Where can I find a support group for people with diabetes? ?What number can I call if I have questions? ?When is my next appointment? ?General instructions ?Take over-the-counter and prescription medicines only as told by your health care provider. ?Keep all follow-up visits. This is important. ?Where to find more information ?For help and guidance and for more information about diabetes, please visit: ?American Diabetes Association (ADA): www.diabetes.org ?American Association of Diabetes Care and Education Specialists (ADCES): www.diabeteseducator.org ?International Diabetes Federation (IDF): www.idf.org ?Contact a health care provider if: ?Your blood glucose is at or above 240 mg/dL (13.3 mmol/L) for 2 days in a row. ?You have been sick or have had a fever for 2 days or longer, and you are not getting better. ?You have any of the following problems for more than 6 hours: ?You cannot eat or drink. ?You have nausea and vomiting. ?You have diarrhea. ?Get help right away if: ?You have severe hypoglycemia. This means your blood glucose is lower than 54 mg/dL (3.0 mmol/L). ?You become confused or you have trouble thinking clearly. ?You have difficulty breathing. ?You have moderate or large ketone levels in your urine. ?These symptoms may represent a serious problem that is an emergency. Do not wait to see if the symptoms will go away. Get medical help right away. Call your local emergency services (911 in the [place] Do not drive yourself to the hospital. ?Summary ?Type 2 diabetes mellitus is a long-term, or chronic, disease. In type 2 diabetes, the pancreas does not make enough of a hormone called insulin, or cells in the body do not respond properly to insulin that the body makes. ?This condition is treated by making dietary and lifestyle changes and taking diabetes medicines or  insulin. ?Your health care provider will set treatment goals for you. Your goals will be based on your age, other medical conditions you have, and how you respond to diabetes treatment. ?Keep all follow-up visits. This is important. ?This information is not intended to replace advice given to you by your health care provider. Make sure you discuss any questions you have with your health care provider. ?Document Revised: 02/10/2021 Document Reviewed: 02/10/2021 ?Elsevier Patient Education ? 2022 Elsevier Inc. ? ?

## 2022-04-03 ENCOUNTER — Other Ambulatory Visit: Payer: Self-pay | Admitting: Internal Medicine

## 2022-04-03 DIAGNOSIS — E118 Type 2 diabetes mellitus with unspecified complications: Secondary | ICD-10-CM

## 2022-04-14 ENCOUNTER — Ambulatory Visit (HOSPITAL_COMMUNITY): Payer: Medicare Other | Attending: Internal Medicine

## 2022-04-14 DIAGNOSIS — I350 Nonrheumatic aortic (valve) stenosis: Secondary | ICD-10-CM | POA: Diagnosis not present

## 2022-04-14 DIAGNOSIS — I10 Essential (primary) hypertension: Secondary | ICD-10-CM | POA: Insufficient documentation

## 2022-04-14 DIAGNOSIS — E782 Mixed hyperlipidemia: Secondary | ICD-10-CM | POA: Diagnosis not present

## 2022-04-14 LAB — ECHOCARDIOGRAM COMPLETE
AR max vel: 0.91 cm2
AV Area VTI: 0.97 cm2
AV Area mean vel: 0.92 cm2
AV Mean grad: 51 mmHg
AV Peak grad: 88.4 mmHg
Ao pk vel: 4.7 m/s
Area-P 1/2: 2.11 cm2
S' Lateral: 2.6 cm

## 2022-04-14 MED ORDER — PERFLUTREN LIPID MICROSPHERE
1.0000 mL | INTRAVENOUS | Status: AC | PRN
  Administered 2022-04-14: 1 mL via INTRAVENOUS

## 2022-04-17 ENCOUNTER — Telehealth: Payer: Self-pay | Admitting: Cardiovascular Disease

## 2022-04-17 NOTE — Telephone Encounter (Signed)
  Pt is returning call to get echo result. Transferred call to [employer]

## 2022-04-23 ENCOUNTER — Encounter: Payer: Self-pay | Admitting: Cardiovascular Disease

## 2022-04-23 ENCOUNTER — Ambulatory Visit: Payer: Medicare Other | Admitting: Cardiovascular Disease

## 2022-04-23 VITALS — BP 118/70 | HR 58 | Ht 71.0 in | Wt 232.0 lb

## 2022-04-23 DIAGNOSIS — I10 Essential (primary) hypertension: Secondary | ICD-10-CM

## 2022-04-23 DIAGNOSIS — I350 Nonrheumatic aortic (valve) stenosis: Secondary | ICD-10-CM

## 2022-04-23 DIAGNOSIS — Z01810 Encounter for preprocedural cardiovascular examination: Secondary | ICD-10-CM

## 2022-04-23 DIAGNOSIS — E782 Mixed hyperlipidemia: Secondary | ICD-10-CM | POA: Diagnosis not present

## 2022-04-23 NOTE — Progress Notes (Addendum)
Pre Surgical Assessment: 5 M Walk Test  57M=16.7f  5 Meter Walk Test- trial 1: 11.45 seconds 5 Meter Walk Test- trial 2: 9.80 seconds 5 Meter Walk Test- trial 3: 11.28 seconds 5 Meter Walk Test Average: 10.84 seconds    Procedure: Isolated AVR Risk of Mortality: 2.313% Renal Failure: 3.536% Permanent Stroke: 1.114% Prolonged Ventilation: 7.716% DSW Infection: 0.229% Reoperation: 3.478% Morbidity or Mortality: 13.552% Short Length of Stay: 31.776% Long Length of Stay: 6.668%

## 2022-04-23 NOTE — Patient Instructions (Signed)
Medication Instructions:  Your physician recommends that you continue on your current medications as directed. Please refer to the Current Medication list given to you today.  *If you need a refill on your cardiac medications before your next appointment, please call your pharmacy*   Lab Work: CBC, BMET on 05/04/22 If you have labs (blood work) drawn today and your tests are completely normal, you will receive your results only by: [place] (if you have MyChart) OR A paper copy in the mail If you have any lab test that is abnormal or we need to change your treatment, we will call you to review the results.   Testing/Procedures: R & L heart cath Your physician has requested that you have a cardiac catheterization. Cardiac catheterization is used to diagnose and/or treat various heart conditions. Doctors may recommend this procedure for a number of different reasons. The most common reason is to evaluate chest pain. Chest pain can be a symptom of coronary artery disease (CAD), and cardiac catheterization can show whether plaque is narrowing or blocking your heart's arteries. This procedure is also used to evaluate the valves, as well as measure the blood flow and oxygen levels in different parts of your heart. For further information please visit [email or web address]. Please follow instruction sheet, as given.  Follow-Up: At [facility], you and your health needs are our priority.  As part of our continuing mission to provide you with exceptional heart care, we have created designated Provider Care Teams.  These Care Teams include your primary Cardiologist (physician) and Advanced Practice Providers (APPs -  Physician Assistants and Nurse Practitioners) who all work together to provide you with the care you need, when you need it.  We recommend signing up for the patient portal called "MyChart".  Sign up information is provided on this After Visit Summary.  MyChart is used to connect with  patients for Virtual Visits (Telemedicine).  Patients are able to view lab/test results, encounter notes, upcoming appointments, etc.  Non-urgent messages can be sent to your provider as well.   To learn more about what you can do with MyChart, go to [email or web address].    Your next appointment:   Structural team will follow up  Provider:   Sherren Mocha, MD     Other Instructions  [facility] [address] [place], [place] [address] Dept: [phone] Loc: [place]  04/23/2022  You are scheduled for a Cardiac Catheterization on Thursday, June 8 with Dr. Sherren Mocha.  1. Please arrive at the Main Entrance A at [facility]: [place], [place] [address] at 10:00 AM (This time is two hours before your procedure to ensure your preparation). Free valet parking service is available.   Special note: Every effort is made to have your procedure done on time. Please understand that emergencies sometimes delay scheduled procedures.  2. Diet: Do not eat solid foods after midnight.  You may have clear liquids until 5 AM upon the day of the procedure.  3. Labs: You will need to have blood drawn on Monday, June 5 at [facility] at [facility]. [address]  Open: 7:30am - 5pm    Phone: [phone]. You do not need to be fasting.  4. Medication instructions in preparation for your procedure:   Contrast Allergy: No  Take only 30 units of insulin the night before your procedure. Do  not take any insulin on the day of the procedure.  Do NOT take Losartan or Atenolol/Chlorthalidone the day before or day of procedure  Do NOT take Jardiance (or any diabetes medication) the morning of procedure   On the morning of your procedure, take Aspirin '81mg'$  and any morning medicines NOT listed above.  You may use sips of water.  5. Plan  to go home the same day, you will only stay overnight if medically necessary. 6. You MUST have a responsible adult to drive you home. 7. An adult MUST be with you the first 24 hours after you arrive home. 8. Bring a current list of your medications, and the last time and date medication taken. 9. Bring ID and current insurance cards. 10.Please wear clothes that are easy to get on and off and wear slip-on shoes.  Thank you for allowing [place] to care for you!   -- [facility]     Important Information About Sugar

## 2022-04-23 NOTE — H&P (View-Only) (Signed)
Cardiology Office Note:    Date:  04/23/2022   ID:  Ryan Duke, DOB [birth year], MRN [id number]  PCP:  Janith Lima, MD   [facility] Providers Cardiologist:  Sherren Mocha, MD     Referring MD: Janith Lima, MD   Chief Complaint  Patient presents with   Aortic Stenosis    History of Present Illness:    Ryan Duke is a 81 y.o. male with a hx of aortic stenosis, presenting for follow-up evaluation.  He was last seen in November 2022 for follow-up of severe, stage C aortic stenosis.  The patient had a recent echo which demonstrated hyperdynamic left ventricular systolic function with LVEF greater than 75%.  There is severe aortic stenosis and mild aortic insufficiency.  The patient's mean transaortic gradient is 51 mmHg with a maximal velocity of 4.7 m/s.  The patient is here with his wife today.  He has begun to experience symptoms of progressive fatigue and exertional dyspnea.  He denies chest pain or pressure.  He has had a few episodes of lightheadedness with activity.  He is having to stop and rest more often with physical exertion.  The patient is short of breath with 1 flight of stairs.  He has not seen a dentist in many years and has partial upper dentures.  He denies any active problems with his teeth.   Past Medical History:  Diagnosis Date   Diabetes mellitus    type 2   Hyperlipidemia    Hypertension    Hypogonadism, male    Pancreatitis 2008   Renal insufficiency     Past Surgical History:  Procedure Laterality Date   EYE SURGERY     cataracts   FLEXIBLE SIGMOIDOSCOPY N/A 09/10/2017   Procedure: FLEXIBLE SIGMOIDOSCOPY;  Surgeon: Mauri Pole, MD;  Location: [facility];  Service: Endoscopy;  Laterality: N/A;   IR KYPHO LUMBAR INC FX REDUCE BONE BX UNI/BIL CANNULATION INC/IMAGING  09/14/2017   IR RADIOLOGIST EVAL & MGMT  10/07/2017    Current Medications: Current Meds  Medication Sig   atenolol-chlorthalidone (TENORETIC) 100-25 MG  tablet TAKE ONE TABLET BY MOUTH ONCE DAILY   Cholecalciferol 50 MCG (2000 UT) TABS Take 1 tablet (2,000 Units total) by mouth daily.   Continuous Blood Gluc Receiver (FREESTYLE LIBRE 2 READER) DEVI 1 Act by Does not apply route daily.   Continuous Blood Gluc Sensor (FREESTYLE LIBRE 2 SENSOR) MISC USE AS DIRECTED AND REPLACE EVERY 14 DAYS.   COVID-19 mRNA bivalent vaccine, Pfizer, (PFIZER COVID-19 VAC BIVALENT) injection Inject into the muscle.   DULoxetine (CYMBALTA) 60 MG capsule TAKE (1) CAPSULE DAILY.   glucagon (GLUCAGEN HYPOKIT) 1 MG SOLR injection Inject 1 mg into the vein once as needed for up to 1 dose for low blood sugar.   Insulin Glargine-Lixisenatide (SOLIQUA) 100-33 UNT-MCG/ML SOPN Inject 60 Units into the skin daily.   Insulin Pen Needle (NOVOFINE) 32G X 6 MM MISC 1 Act by Does not apply route daily.   JARDIANCE 25 MG TABS tablet TAKE ONE TABLET ONCE DAILY BEFORE BREAKFAST.   KERENDIA 20 MG TABS Take 1 tablet by mouth daily.   losartan (COZAAR) 100 MG tablet Take 1 tablet (100 mg total) by mouth daily. for high blood pressure   Multiple Vitamin (MULTIVITAMIN WITH MINERALS) TABS tablet Take 1 tablet by mouth 2 (two) times a week.   Multiple Vitamins-Minerals (PRESERVISION AREDS PO) Take by mouth.   MYRBETRIQ 50 MG TB24 tablet TAKE 1 TABLET ONCE  DAILY.   pravastatin (PRAVACHOL) 40 MG tablet TAKE ONE TABLET BY MOUTH ONCE DAILY.   pregabalin (LYRICA) 150 MG capsule Take 150 mg by mouth 3 (three) times daily.   tamsulosin (FLOMAX) 0.4 MG CAPS capsule TAKE ONE CAPSULE BY MOUTH DAILY     Allergies:   Fentanyl, Farxiga [dapagliflozin], Metformin and related, Atorvastatin, Benazepril hcl, Flexeril [cyclobenzaprine], and Oxycodone   Social History   Socioeconomic History   Marital status: Married    Spouse name: Not on file   Number of children: Not on file   Years of education: Not on file   Highest education level: Not on file  Occupational History   Occupation: retired  Tobacco  Use   Smoking status: Former    Types: Cigarettes    Quit date: 11/30/1974    Years since quitting: 47.4    Passive exposure: Never   Smokeless tobacco: Never  Vaping Use   Vaping Use: Never used  Substance and Sexual Activity   Alcohol use: Not Currently    Alcohol/week: 0.0 standard drinks   Drug use: No   Sexual activity: Yes  Other Topics Concern   Not on file  Social History Narrative   Regular exercise-yes   Social Determinants of Health   Financial Resource Strain: Low Risk    Difficulty of Paying Living Expenses: Not hard at all  Food Insecurity: No Food Insecurity   Worried About Charity fundraiser in the Last Year: Never true   [place] in the Last Year: Never true  Transportation Needs: No Transportation Needs   Lack of Transportation (Medical): No   Lack of Transportation (Non-Medical): No  Physical Activity: Insufficiently Active   Days of Exercise per Week: 2 days   Minutes of Exercise per Session: 20 min  Stress: No Stress Concern Present   Feeling of Stress : Not at all  Social Connections: Moderately Isolated   Frequency of Communication with Friends and Family: Twice a week   Frequency of Social Gatherings with Friends and Family: Twice a week   Attends Religious Services: Never   Marine scientist or Organizations: No   Attends Music therapist: Never   Marital Status: Married     Family History: The patient's family history includes Hypertension in an other family member.  ROS:   Please see the history of present illness.    All other systems reviewed and are negative.  EKGs/Labs/Other Studies Reviewed:    The following studies were reviewed today: Echo: IMPRESSIONS     1. Left ventricular ejection fraction, by estimation, is >75%. The left  ventricle has hyperdynamic function. The left ventricle has no regional  wall motion abnormalities. There is moderate asymmetric left ventricular  hypertrophy of the  basal-septal  segment. Left ventricular diastolic parameters are consistent with Grade I  diastolic dysfunction (impaired relaxation).   2. Right ventricular systolic function is normal. The right ventricular  size is normal. Tricuspid regurgitation signal is inadequate for assessing  PA pressure.   3. Left atrial size was mildly dilated.   4. Right atrial size was mildly dilated.   5. The mitral valve is abnormal. Trivial mitral valve regurgitation.   6. The aortic valve has an indeterminant number of cusps. There is severe  calcifcation of the aortic valve. Aortic valve regurgitation is mild.  Severe aortic valve stenosis. Aortic valve area, by VTI measures 0.97 cm.  Aortic valve mean gradient  measures 51.0 mmHg. Aortic valve Vmax  measures 4.70 m/s. Peak gradient  88.4 mmHg. Based on an LVOT diameter of 2.1 cm. DI is 0.28.   7. Aortic dilatation noted. There is borderline dilatation of the aortic  root, measuring 39 mm. There is mild dilatation of the ascending aorta,  measuring 41 mm.   8. The inferior vena cava is normal in size with <50% respiratory  variability, suggesting right atrial pressure of 8 mmHg.   Comparison(s): 10/15/21 EF 70-75%. Severe AS 39mHg mean PG, 853mg peak  PG. Mild AI.   EKG:  EKG is ordered today.  The ekg ordered today demonstrates sinus bradycardia with first-degree AV block, left axis deviation, LVH with nonspecific T wave abnormality.  Recent Labs: 12/04/2021: ALT 13; TSH 2.96 03/10/2022: BUN 27; Creatinine, Ser 1.54; Hemoglobin 15.9; Platelets 213.0; Potassium 4.2; Sodium 140  Recent Lipid Panel    Component Value Date/Time   CHOL 152 12/04/2021 1041   TRIG 96.0 12/04/2021 1041   TRIG 171 (H) 10/06/2006 0741   HDL 40.20 12/04/2021 1041   CHOLHDL 4 12/04/2021 1041   VLDL 19.2 12/04/2021 1041   LDLCALC 93 12/04/2021 1041   LDLDIRECT 81.0 05/26/2018 1547     Risk Assessment/Calculations:           Physical Exam:    VS:  BP 118/70    Pulse (!) 58   Ht '5\' 11"'$  (1.803 m)   Wt 232 lb (105.2 kg)   SpO2 94%   BMI 32.36 kg/m     Wt Readings from Last 3 Encounters:  04/23/22 232 lb (105.2 kg)  03/10/22 232 lb (105.2 kg)  12/04/21 226 lb (102.5 kg)     GEN:  Well nourished, well developed in no acute distress HEENT: Normal NECK: No JVD; No carotid bruits LYMPHATICS: No lymphadenopathy CARDIAC: RRR, 3/6 harsh late peaking systolic murmur at the right upper sternal border RESPIRATORY:  Clear to auscultation without rales, wheezing or rhonchi  ABDOMEN: Soft, non-tender, non-distended MUSCULOSKELETAL:  No edema; No deformity  SKIN: Warm and dry NEUROLOGIC:  Alert and oriented x 3 PSYCHIATRIC:  Normal affect   ASSESSMENT:    1. Nonrheumatic aortic valve stenosis   2. Essential hypertension   3. Mixed hyperlipidemia   4. Pre-procedural cardiovascular examination    PLAN:    In order of problems listed above:  The patient has severe, stage D1 aortic stenosis.  He now has progressive symptoms with exertional dyspnea and fatigue occurring with low-level activity (NYHA functional class III).  His echocardiogram is reviewed and confirms severe aortic stenosis with findings outlined above including a mean gradient of 51 mmHg, peak velocity of 4.7, and preserved LV systolic function.  We reviewed the natural history of severe aortic stenosis today.  We discussed potential treatment options including surgical or transcatheter aortic valve replacement and palliative medical therapy.  The patient is otherwise in relatively good health and is functionally independent.  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  As long as his anatomy is suitable and he has no high-grade obstructive CAD, he will likely be a good candidate for TAVR.  We reviewed today work-up for TAVR today.  The patient will need to undergo cardiac catheterization to evaluate  for obstructive CAD. I have reviewed the risks, indications, and alternatives to cardiac catheterization, possible angioplasty, and stenting with the patient. Risks include but are not limited to bleeding, infection, vascular injury, stroke, myocardial infection, arrhythmia, kidney injury, radiation-related injury  in the case of prolonged fluoroscopy use, emergency cardiac surgery, and death. The patient understands the risks of serious complication is 1-2 in 1027 with diagnostic cardiac cath and 1-2% or less with angioplasty/stenting.   He will then undergo a gated cardiac CTA and a CTA of the chest, abdomen, and pelvis to evaluate vascular anatomy and anatomic suitability for TAVR.  Once his studies are completed, he will be referred for formal cardiac surgical consultation with Dr. Cyndia Bent as part of a multidisciplinary approach to his care.  He will also be referred for an outpatient dental consult to clear him for valve surgery. Blood pressure is well controlled on atenolol, chlorthalidone, and losartan Treated with pravastatin, managed by PCP.      Shared Decision Making/Informed Consent The risks [stroke (1 in 1000), death (1 in 1000), kidney failure [usually temporary] (1 in 500), bleeding (1 in 200), allergic reaction [possibly serious] (1 in 200)], benefits (diagnostic support and management of coronary artery disease) and alternatives of a cardiac catheterization were discussed in detail with Mr. Hogans and he is willing to proceed.    Medication Adjustments/Labs and Tests Ordered: Current medicines are reviewed at length with the patient today.  Concerns regarding medicines are outlined above.  Orders Placed This Encounter  Procedures   CT CORONARY MORPH W/CTA COR W/SCORE W/CA W/CM &/OR WO/CM   CT ANGIO CHEST AORTA W/CM & OR WO/CM   CT ANGIO ABDOMEN PELVIS  W &/OR WO CONTRAST   Comprehensive metabolic panel   Basic metabolic panel   EKG 25-DGUY   No orders of the defined types were  placed in this encounter.   Patient Instructions  Medication Instructions:  Your physician recommends that you continue on your current medications as directed. Please refer to the Current Medication list given to you today.  *If you need a refill on your cardiac medications before your next appointment, please call your pharmacy*   Lab Work: CBC, BMET on 05/04/22 If you have labs (blood work) drawn today and your tests are completely normal, you will receive your results only by: [place] (if you have MyChart) OR A paper copy in the mail If you have any lab test that is abnormal or we need to change your treatment, we will call you to review the results.   Testing/Procedures: R & L heart cath Your physician has requested that you have a cardiac catheterization. Cardiac catheterization is used to diagnose and/or treat various heart conditions. Doctors may recommend this procedure for a number of different reasons. The most common reason is to evaluate chest pain. Chest pain can be a symptom of coronary artery disease (CAD), and cardiac catheterization can show whether plaque is narrowing or blocking your heart's arteries. This procedure is also used to evaluate the valves, as well as measure the blood flow and oxygen levels in different parts of your heart. For further information please visit [email or web address]. Please follow instruction sheet, as given.  Follow-Up: At [facility], you and your health needs are our priority.  As part of our continuing mission to provide you with exceptional heart care, we have created designated Provider Care Teams.  These Care Teams include your primary Cardiologist (physician) and Advanced Practice Providers (APPs -  Physician Assistants and Nurse Practitioners) who all work together to provide you with the care you need, when you need it.  We recommend signing up for the patient portal called "MyChart".  Sign up information is provided on this  After Visit Summary.  MyChart is used to connect with patients for Virtual Visits (Telemedicine).  Patients are able to view lab/test results, encounter notes, upcoming appointments, etc.  Non-urgent messages can be sent to your provider as well.   To learn more about what you can do with MyChart, go to [email or web address].    Your next appointment:   Structural team will follow up  Provider:   Sherren Mocha, MD     Other Instructions  [facility] OFFICE [address] [place], [place] [address] Dept: [phone] Loc: [place]  04/23/2022  You are scheduled for a Cardiac Catheterization on Thursday, June 8 with Dr. Sherren Mocha.  1. Please arrive at the Main Entrance A at [facility]: [address], [place] [address] at 10:00 AM (This time is two hours before your procedure to ensure your preparation). Free valet parking service is available.   Special note: Every effort is made to have your procedure done on time. Please understand that emergencies sometimes delay scheduled procedures.  2. Diet: Do not eat solid foods after midnight.  You may have clear liquids until 5 AM upon the day of the procedure.  3. Labs: You will need to have blood drawn on Monday, June 5 at [facility]. [address]  Open: 7:30am - 5pm    Phone: [phone]. You do not need to be fasting.  4. Medication instructions in preparation for your procedure:   Contrast Allergy: No  Take only 30 units of insulin the night before your procedure. Do not take any insulin on the day of the procedure.  Do NOT take Losartan or Atenolol/Chlorthalidone the day before or day of procedure  Do NOT take Jardiance (or any diabetes medication) the morning of procedure   On the morning of your procedure, take Aspirin '81mg'$  and any morning medicines NOT  listed above.  You may use sips of water.  5. Plan to go home the same day, you will only stay overnight if medically necessary. 6. You MUST have a responsible adult to drive you home. 7. An adult MUST be with you the first 24 hours after you arrive home. 8. Bring a current list of your medications, and the last time and date medication taken. 9. Bring ID and current insurance cards. 10.Please wear clothes that are easy to get on and off and wear slip-on shoes.  Thank you for allowing [place] to care for you!   -- [facility]     Important Information About Sugar         Signed, Sherren Mocha, MD  04/23/2022 4:52 PM    [place]

## 2022-04-23 NOTE — H&P (View-Only) (Signed)
Cardiology Office Note:    Date:  04/23/2022   ID:  Ryan Duke, DOB [birth year], MRN [id number]  PCP:  Janith Lima, MD   [facility] Providers Cardiologist:  Sherren Mocha, MD     Referring MD: Janith Lima, MD   Chief Complaint  Patient presents with   Aortic Stenosis    History of Present Illness:    Ryan Duke is a 81 y.o. male with a hx of aortic stenosis, presenting for follow-up evaluation.  He was last seen in November 2022 for follow-up of severe, stage C aortic stenosis.  The patient had a recent echo which demonstrated hyperdynamic left ventricular systolic function with LVEF greater than 75%.  There is severe aortic stenosis and mild aortic insufficiency.  The patient's mean transaortic gradient is 51 mmHg with a maximal velocity of 4.7 m/s.  The patient is here with his wife today.  He has begun to experience symptoms of progressive fatigue and exertional dyspnea.  He denies chest pain or pressure.  He has had a few episodes of lightheadedness with activity.  He is having to stop and rest more often with physical exertion.  The patient is short of breath with 1 flight of stairs.  He has not seen a dentist in many years and has partial upper dentures.  He denies any active problems with his teeth.   Past Medical History:  Diagnosis Date   Diabetes mellitus    type 2   Hyperlipidemia    Hypertension    Hypogonadism, male    Pancreatitis 2008   Renal insufficiency     Past Surgical History:  Procedure Laterality Date   EYE SURGERY     cataracts   FLEXIBLE SIGMOIDOSCOPY N/A 09/10/2017   Procedure: FLEXIBLE SIGMOIDOSCOPY;  Surgeon: Mauri Pole, MD;  Location: [facility];  Service: Endoscopy;  Laterality: N/A;   IR KYPHO LUMBAR INC FX REDUCE BONE BX UNI/BIL CANNULATION INC/IMAGING  09/14/2017   IR RADIOLOGIST EVAL & MGMT  10/07/2017    Current Medications: Current Meds  Medication Sig   atenolol-chlorthalidone (TENORETIC) 100-25 MG  tablet TAKE ONE TABLET BY MOUTH ONCE DAILY   Cholecalciferol 50 MCG (2000 UT) TABS Take 1 tablet (2,000 Units total) by mouth daily.   Continuous Blood Gluc Receiver (FREESTYLE LIBRE 2 READER) DEVI 1 Act by Does not apply route daily.   Continuous Blood Gluc Sensor (FREESTYLE LIBRE 2 SENSOR) MISC USE AS DIRECTED AND REPLACE EVERY 14 DAYS.   COVID-19 mRNA bivalent vaccine, Pfizer, (PFIZER COVID-19 VAC BIVALENT) injection Inject into the muscle.   DULoxetine (CYMBALTA) 60 MG capsule TAKE (1) CAPSULE DAILY.   glucagon (GLUCAGEN HYPOKIT) 1 MG SOLR injection Inject 1 mg into the vein once as needed for up to 1 dose for low blood sugar.   Insulin Glargine-Lixisenatide (SOLIQUA) 100-33 UNT-MCG/ML SOPN Inject 60 Units into the skin daily.   Insulin Pen Needle (NOVOFINE) 32G X 6 MM MISC 1 Act by Does not apply route daily.   JARDIANCE 25 MG TABS tablet TAKE ONE TABLET ONCE DAILY BEFORE BREAKFAST.   KERENDIA 20 MG TABS Take 1 tablet by mouth daily.   losartan (COZAAR) 100 MG tablet Take 1 tablet (100 mg total) by mouth daily. for high blood pressure   Multiple Vitamin (MULTIVITAMIN WITH MINERALS) TABS tablet Take 1 tablet by mouth 2 (two) times a week.   Multiple Vitamins-Minerals (PRESERVISION AREDS PO) Take by mouth.   MYRBETRIQ 50 MG TB24 tablet TAKE 1 TABLET ONCE  DAILY.   pravastatin (PRAVACHOL) 40 MG tablet TAKE ONE TABLET BY MOUTH ONCE DAILY.   pregabalin (LYRICA) 150 MG capsule Take 150 mg by mouth 3 (three) times daily.   tamsulosin (FLOMAX) 0.4 MG CAPS capsule TAKE ONE CAPSULE BY MOUTH DAILY     Allergies:   Fentanyl, Farxiga [dapagliflozin], Metformin and related, Atorvastatin, Benazepril hcl, Flexeril [cyclobenzaprine], and Oxycodone   Social History   Socioeconomic History   Marital status: Married    Spouse name: Not on file   Number of children: Not on file   Years of education: Not on file   Highest education level: Not on file  Occupational History   Occupation: retired  Tobacco  Use   Smoking status: Former    Types: Cigarettes    Quit date: 11/30/1974    Years since quitting: 47.4    Passive exposure: Never   Smokeless tobacco: Never  Vaping Use   Vaping Use: Never used  Substance and Sexual Activity   Alcohol use: Not Currently    Alcohol/week: 0.0 standard drinks   Drug use: No   Sexual activity: Yes  Other Topics Concern   Not on file  Social History Narrative   Regular exercise-yes   Social Determinants of Health   Financial Resource Strain: Low Risk    Difficulty of Paying Living Expenses: Not hard at all  Food Insecurity: No Food Insecurity   Worried About Charity fundraiser in the Last Year: Never true   [place] in the Last Year: Never true  Transportation Needs: No Transportation Needs   Lack of Transportation (Medical): No   Lack of Transportation (Non-Medical): No  Physical Activity: Insufficiently Active   Days of Exercise per Week: 2 days   Minutes of Exercise per Session: 20 min  Stress: No Stress Concern Present   Feeling of Stress : Not at all  Social Connections: Moderately Isolated   Frequency of Communication with Friends and Family: Twice a week   Frequency of Social Gatherings with Friends and Family: Twice a week   Attends Religious Services: Never   Marine scientist or Organizations: No   Attends Music therapist: Never   Marital Status: Married     Family History: The patient's family history includes Hypertension in an other family member.  ROS:   Please see the history of present illness.    All other systems reviewed and are negative.  EKGs/Labs/Other Studies Reviewed:    The following studies were reviewed today: Echo: IMPRESSIONS     1. Left ventricular ejection fraction, by estimation, is >75%. The left  ventricle has hyperdynamic function. The left ventricle has no regional  wall motion abnormalities. There is moderate asymmetric left ventricular  hypertrophy of the  basal-septal  segment. Left ventricular diastolic parameters are consistent with Grade I  diastolic dysfunction (impaired relaxation).   2. Right ventricular systolic function is normal. The right ventricular  size is normal. Tricuspid regurgitation signal is inadequate for assessing  PA pressure.   3. Left atrial size was mildly dilated.   4. Right atrial size was mildly dilated.   5. The mitral valve is abnormal. Trivial mitral valve regurgitation.   6. The aortic valve has an indeterminant number of cusps. There is severe  calcifcation of the aortic valve. Aortic valve regurgitation is mild.  Severe aortic valve stenosis. Aortic valve area, by VTI measures 0.97 cm.  Aortic valve mean gradient  measures 51.0 mmHg. Aortic valve Vmax  measures 4.70 m/s. Peak gradient  88.4 mmHg. Based on an LVOT diameter of 2.1 cm. DI is 0.28.   7. Aortic dilatation noted. There is borderline dilatation of the aortic  root, measuring 39 mm. There is mild dilatation of the ascending aorta,  measuring 41 mm.   8. The inferior vena cava is normal in size with <50% respiratory  variability, suggesting right atrial pressure of 8 mmHg.   Comparison(s): 10/15/21 EF 70-75%. Severe AS 55mHg mean PG, 873mg peak  PG. Mild AI.   EKG:  EKG is ordered today.  The ekg ordered today demonstrates sinus bradycardia with first-degree AV block, left axis deviation, LVH with nonspecific T wave abnormality.  Recent Labs: 12/04/2021: ALT 13; TSH 2.96 03/10/2022: BUN 27; Creatinine, Ser 1.54; Hemoglobin 15.9; Platelets 213.0; Potassium 4.2; Sodium 140  Recent Lipid Panel    Component Value Date/Time   CHOL 152 12/04/2021 1041   TRIG 96.0 12/04/2021 1041   TRIG 171 (H) 10/06/2006 0741   HDL 40.20 12/04/2021 1041   CHOLHDL 4 12/04/2021 1041   VLDL 19.2 12/04/2021 1041   LDLCALC 93 12/04/2021 1041   LDLDIRECT 81.0 05/26/2018 1547     Risk Assessment/Calculations:           Physical Exam:    VS:  BP 118/70    Pulse (!) 58   Ht '5\' 11"'$  (1.803 m)   Wt 232 lb (105.2 kg)   SpO2 94%   BMI 32.36 kg/m     Wt Readings from Last 3 Encounters:  04/23/22 232 lb (105.2 kg)  03/10/22 232 lb (105.2 kg)  12/04/21 226 lb (102.5 kg)     GEN:  Well nourished, well developed in no acute distress HEENT: Normal NECK: No JVD; No carotid bruits LYMPHATICS: No lymphadenopathy CARDIAC: RRR, 3/6 harsh late peaking systolic murmur at the right upper sternal border RESPIRATORY:  Clear to auscultation without rales, wheezing or rhonchi  ABDOMEN: Soft, non-tender, non-distended MUSCULOSKELETAL:  No edema; No deformity  SKIN: Warm and dry NEUROLOGIC:  Alert and oriented x 3 PSYCHIATRIC:  Normal affect   ASSESSMENT:    1. Nonrheumatic aortic valve stenosis   2. Essential hypertension   3. Mixed hyperlipidemia   4. Pre-procedural cardiovascular examination    PLAN:    In order of problems listed above:  The patient has severe, stage D1 aortic stenosis.  He now has progressive symptoms with exertional dyspnea and fatigue occurring with low-level activity (NYHA functional class III).  His echocardiogram is reviewed and confirms severe aortic stenosis with findings outlined above including a mean gradient of 51 mmHg, peak velocity of 4.7, and preserved LV systolic function.  We reviewed the natural history of severe aortic stenosis today.  We discussed potential treatment options including surgical or transcatheter aortic valve replacement and palliative medical therapy.  The patient is otherwise in relatively good health and is functionally independent.  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  As long as his anatomy is suitable and he has no high-grade obstructive CAD, he will likely be a good candidate for TAVR.  We reviewed today work-up for TAVR today.  The patient will need to undergo cardiac catheterization to evaluate  for obstructive CAD. I have reviewed the risks, indications, and alternatives to cardiac catheterization, possible angioplasty, and stenting with the patient. Risks include but are not limited to bleeding, infection, vascular injury, stroke, myocardial infection, arrhythmia, kidney injury, radiation-related injury  in the case of prolonged fluoroscopy use, emergency cardiac surgery, and death. The patient understands the risks of serious complication is 1-2 in 2500 with diagnostic cardiac cath and 1-2% or less with angioplasty/stenting.   He will then undergo a gated cardiac CTA and a CTA of the chest, abdomen, and pelvis to evaluate vascular anatomy and anatomic suitability for TAVR.  Once his studies are completed, he will be referred for formal cardiac surgical consultation with Dr. Cyndia Bent as part of a multidisciplinary approach to his care.  He will also be referred for an outpatient dental consult to clear him for valve surgery. Blood pressure is well controlled on atenolol, chlorthalidone, and losartan Treated with pravastatin, managed by PCP.      Shared Decision Making/Informed Consent The risks [stroke (1 in 1000), death (1 in 1000), kidney failure [usually temporary] (1 in 500), bleeding (1 in 200), allergic reaction [possibly serious] (1 in 200)], benefits (diagnostic support and management of coronary artery disease) and alternatives of a cardiac catheterization were discussed in detail with Ryan Duke and he is willing to proceed.    Medication Adjustments/Labs and Tests Ordered: Current medicines are reviewed at length with the patient today.  Concerns regarding medicines are outlined above.  Orders Placed This Encounter  Procedures   CT CORONARY MORPH W/CTA COR W/SCORE W/CA W/CM &/OR WO/CM   CT ANGIO CHEST AORTA W/CM & OR WO/CM   CT ANGIO ABDOMEN PELVIS  W &/OR WO CONTRAST   Comprehensive metabolic panel   Basic metabolic panel   EKG 37-CWUG   No orders of the defined types were  placed in this encounter.   Patient Instructions  Medication Instructions:  Your physician recommends that you continue on your current medications as directed. Please refer to the Current Medication list given to you today.  *If you need a refill on your cardiac medications before your next appointment, please call your pharmacy*   Lab Work: CBC, BMET on 05/04/22 If you have labs (blood work) drawn today and your tests are completely normal, you will receive your results only by: [place] (if you have MyChart) OR A paper copy in the mail If you have any lab test that is abnormal or we need to change your treatment, we will call you to review the results.   Testing/Procedures: R & L heart cath Your physician has requested that you have a cardiac catheterization. Cardiac catheterization is used to diagnose and/or treat various heart conditions. Doctors may recommend this procedure for a number of different reasons. The most common reason is to evaluate chest pain. Chest pain can be a symptom of coronary artery disease (CAD), and cardiac catheterization can show whether plaque is narrowing or blocking your heart's arteries. This procedure is also used to evaluate the valves, as well as measure the blood flow and oxygen levels in different parts of your heart. For further information please visit [email or web address]. Please follow instruction sheet, as given.  Follow-Up: At [facility], you and your health needs are our priority.  As part of our continuing mission to provide you with exceptional heart care, we have created designated Provider Care Teams.  These Care Teams include your primary Cardiologist (physician) and Advanced Practice Providers (APPs -  Physician Assistants and Nurse Practitioners) who all work together to provide you with the care you need, when you need it.  We recommend signing up for the patient portal called "MyChart".  Sign up information is provided on this  After Visit Summary.  MyChart is used to connect with patients for Virtual Visits (Telemedicine).  Patients are able to view lab/test results, encounter notes, upcoming appointments, etc.  Non-urgent messages can be sent to your provider as well.   To learn more about what you can do with MyChart, go to [email or web address].    Your next appointment:   Structural team will follow up  Provider:   Sherren Mocha, MD     Other Instructions  [place] OFFICE [address] [place], [place] [address] Dept: [phone] Loc: [place]  04/23/2022  You are scheduled for a Cardiac Catheterization on Thursday, June 8 with Dr. Sherren Mocha.  1. Please arrive at the Main Entrance A at [facility]: [place], [place] [address] at 10:00 AM (This time is two hours before your procedure to ensure your preparation). Free valet parking service is available.   Special note: Every effort is made to have your procedure done on time. Please understand that emergencies sometimes delay scheduled procedures.  2. Diet: Do not eat solid foods after midnight.  You may have clear liquids until 5 AM upon the day of the procedure.  3. Labs: You will need to have blood drawn on Monday, June 5 at [facility] at [facility]. [address]  Open: 7:30am - 5pm    Phone: [phone]. You do not need to be fasting.  4. Medication instructions in preparation for your procedure:   Contrast Allergy: No  Take only 30 units of insulin the night before your procedure. Do not take any insulin on the day of the procedure.  Do NOT take Losartan or Atenolol/Chlorthalidone the day before or day of procedure  Do NOT take Jardiance (or any diabetes medication) the morning of procedure   On the morning of your procedure, take Aspirin '81mg'$  and any morning medicines NOT  listed above.  You may use sips of water.  5. Plan to go home the same day, you will only stay overnight if medically necessary. 6. You MUST have a responsible adult to drive you home. 7. An adult MUST be with you the first 24 hours after you arrive home. 8. Bring a current list of your medications, and the last time and date medication taken. 9. Bring ID and current insurance cards. 10.Please wear clothes that are easy to get on and off and wear slip-on shoes.  Thank you for allowing [place] to care for you!   -- [facility]     Important Information About Sugar         Signed, Sherren Mocha, MD  04/23/2022 4:52 PM    [place]

## 2022-04-23 NOTE — Progress Notes (Signed)
Cardiology Office Note:    Date:  04/23/2022   ID:  Ryan Duke, DOB 1941/08/14, MRN [id number]  PCP:  Janith Lima, MD   [facility] Providers Cardiologist:  Sherren Mocha, MD     Referring MD: Janith Lima, MD   Chief Complaint  Patient presents with   Aortic Stenosis    History of Present Illness:    Ryan Duke is a 81 y.o. male with a hx of aortic stenosis, presenting for follow-up evaluation.  He was last seen in November 2022 for follow-up of severe, stage C aortic stenosis.  The patient had a recent echo which demonstrated hyperdynamic left ventricular systolic function with LVEF greater than 75%.  There is severe aortic stenosis and mild aortic insufficiency.  The patient's mean transaortic gradient is 51 mmHg with a maximal velocity of 4.7 m/s.  The patient is here with his wife today.  He has begun to experience symptoms of progressive fatigue and exertional dyspnea.  He denies chest pain or pressure.  He has had a few episodes of lightheadedness with activity.  He is having to stop and rest more often with physical exertion.  The patient is short of breath with 1 flight of stairs.  He has not seen a dentist in many years and has partial upper dentures.  He denies any active problems with his teeth.   Past Medical History:  Diagnosis Date   Diabetes mellitus    type 2   Hyperlipidemia    Hypertension    Hypogonadism, male    Pancreatitis 2008   Renal insufficiency     Past Surgical History:  Procedure Laterality Date   EYE SURGERY     cataracts   FLEXIBLE SIGMOIDOSCOPY N/A 09/10/2017   Procedure: FLEXIBLE SIGMOIDOSCOPY;  Surgeon: Mauri Pole, MD;  Location: [facility];  Service: Endoscopy;  Laterality: N/A;   IR KYPHO LUMBAR INC FX REDUCE BONE BX UNI/BIL CANNULATION INC/IMAGING  09/14/2017   IR RADIOLOGIST EVAL & MGMT  10/07/2017    Current Medications: Current Meds  Medication Sig   atenolol-chlorthalidone (TENORETIC) 100-25 MG  tablet TAKE ONE TABLET BY MOUTH ONCE DAILY   Cholecalciferol 50 MCG (2000 UT) TABS Take 1 tablet (2,000 Units total) by mouth daily.   Continuous Blood Gluc Receiver (FREESTYLE LIBRE 2 READER) DEVI 1 Act by Does not apply route daily.   Continuous Blood Gluc Sensor (FREESTYLE LIBRE 2 SENSOR) MISC USE AS DIRECTED AND REPLACE EVERY 14 DAYS.   COVID-19 mRNA bivalent vaccine, Pfizer, (PFIZER COVID-19 VAC BIVALENT) injection Inject into the muscle.   DULoxetine (CYMBALTA) 60 MG capsule TAKE (1) CAPSULE DAILY.   glucagon (GLUCAGEN HYPOKIT) 1 MG SOLR injection Inject 1 mg into the vein once as needed for up to 1 dose for low blood sugar.   Insulin Glargine-Lixisenatide (SOLIQUA) 100-33 UNT-MCG/ML SOPN Inject 60 Units into the skin daily.   Insulin Pen Needle (NOVOFINE) 32G X 6 MM MISC 1 Act by Does not apply route daily.   JARDIANCE 25 MG TABS tablet TAKE ONE TABLET ONCE DAILY BEFORE BREAKFAST.   KERENDIA 20 MG TABS Take 1 tablet by mouth daily.   losartan (COZAAR) 100 MG tablet Take 1 tablet (100 mg total) by mouth daily. for high blood pressure   Multiple Vitamin (MULTIVITAMIN WITH MINERALS) TABS tablet Take 1 tablet by mouth 2 (two) times a week.   Multiple Vitamins-Minerals (PRESERVISION AREDS PO) Take by mouth.   MYRBETRIQ 50 MG TB24 tablet TAKE 1 TABLET ONCE  DAILY.   pravastatin (PRAVACHOL) 40 MG tablet TAKE ONE TABLET BY MOUTH ONCE DAILY.   pregabalin (LYRICA) 150 MG capsule Take 150 mg by mouth 3 (three) times daily.   tamsulosin (FLOMAX) 0.4 MG CAPS capsule TAKE ONE CAPSULE BY MOUTH DAILY     Allergies:   Fentanyl, Farxiga [dapagliflozin], Metformin and related, Atorvastatin, Benazepril hcl, Flexeril [cyclobenzaprine], and Oxycodone   Social History   Socioeconomic History   Marital status: Married    Spouse name: Not on file   Number of children: Not on file   Years of education: Not on file   Highest education level: Not on file  Occupational History   Occupation: retired  Tobacco  Use   Smoking status: Former    Types: Cigarettes    Quit date: 11/30/1974    Years since quitting: 47.4    Passive exposure: Never   Smokeless tobacco: Never  Vaping Use   Vaping Use: Never used  Substance and Sexual Activity   Alcohol use: Not Currently    Alcohol/week: 0.0 standard drinks   Drug use: No   Sexual activity: Yes  Other Topics Concern   Not on file  Social History Narrative   Regular exercise-yes   Social Determinants of Health   Financial Resource Strain: Low Risk    Difficulty of Paying Living Expenses: Not hard at all  Food Insecurity: No Food Insecurity   Worried About Charity fundraiser in the Last Year: Never true   [place]ore in the Last Year: Never true  Transportation Needs: No Transportation Needs   Lack of Transportation (Medical): No   Lack of Transportation (Non-Medical): No  Physical Activity: Insufficiently Active   Days of Exercise per Week: 2 days   Minutes of Exercise per Session: 20 min  Stress: No Stress Concern Present   Feeling of Stress : Not at all  Social Connections: Moderately Isolated   Frequency of Communication with Friends and Family: Twice a week   Frequency of Social Gatherings with Friends and Family: Twice a week   Attends Religious Services: Never   Marine scientist or Organizations: No   Attends Music therapist: Never   Marital Status: Married     Family History: The patient's family history includes Hypertension in an other family member.  ROS:   Please see the history of present illness.    All other systems reviewed and are negative.  EKGs/Labs/Other Studies Reviewed:    The following studies were reviewed today: Echo: IMPRESSIONS     1. Left ventricular ejection fraction, by estimation, is >75%. The left  ventricle has hyperdynamic function. The left ventricle has no regional  wall motion abnormalities. There is moderate asymmetric left ventricular  hypertrophy of the  basal-septal  segment. Left ventricular diastolic parameters are consistent with Grade I  diastolic dysfunction (impaired relaxation).   2. Right ventricular systolic function is normal. The right ventricular  size is normal. Tricuspid regurgitation signal is inadequate for assessing  PA pressure.   3. Left atrial size was mildly dilated.   4. Right atrial size was mildly dilated.   5. The mitral valve is abnormal. Trivial mitral valve regurgitation.   6. The aortic valve has an indeterminant number of cusps. There is severe  calcifcation of the aortic valve. Aortic valve regurgitation is mild.  Severe aortic valve stenosis. Aortic valve area, by VTI measures 0.97 cm.  Aortic valve mean gradient  measures 51.0 mmHg. Aortic valve Vmax  measures 4.70 m/s. Peak gradient  88.4 mmHg. Based on an LVOT diameter of 2.1 cm. DI is 0.28.   7. Aortic dilatation noted. There is borderline dilatation of the aortic  root, measuring 39 mm. There is mild dilatation of the ascending aorta,  measuring 41 mm.   8. The inferior vena cava is normal in size with <50% respiratory  variability, suggesting right atrial pressure of 8 mmHg.   Comparison(s): 10/15/21 EF 70-75%. Severe AS 55mHg mean PG, 891mg peak  PG. Mild AI.   EKG:  EKG is ordered today.  The ekg ordered today demonstrates sinus bradycardia with first-degree AV block, left axis deviation, LVH with nonspecific T wave abnormality.  Recent Labs: 12/04/2021: ALT 13; TSH 2.96 03/10/2022: BUN 27; Creatinine, Ser 1.54; Hemoglobin 15.9; Platelets 213.0; Potassium 4.2; Sodium 140  Recent Lipid Panel    Component Value Date/Time   CHOL 152 12/04/2021 1041   TRIG 96.0 12/04/2021 1041   TRIG 171 (H) 10/06/2006 0741   HDL 40.20 12/04/2021 1041   CHOLHDL 4 12/04/2021 1041   VLDL 19.2 12/04/2021 1041   LDLCALC 93 12/04/2021 1041   LDLDIRECT 81.0 05/26/2018 1547     Risk Assessment/Calculations:           Physical Exam:    VS:  BP 118/70    Pulse (!) 58   Ht '5\' 11"'$  (1.803 m)   Wt 232 lb (105.2 kg)   SpO2 94%   BMI 32.36 kg/m     Wt Readings from Last 3 Encounters:  04/23/22 232 lb (105.2 kg)  03/10/22 232 lb (105.2 kg)  12/04/21 226 lb (102.5 kg)     GEN:  Well nourished, well developed in no acute distress HEENT: Normal NECK: No JVD; No carotid bruits LYMPHATICS: No lymphadenopathy CARDIAC: RRR, 3/6 harsh late peaking systolic murmur at the right upper sternal border RESPIRATORY:  Clear to auscultation without rales, wheezing or rhonchi  ABDOMEN: Soft, non-tender, non-distended MUSCULOSKELETAL:  No edema; No deformity  SKIN: Warm and dry NEUROLOGIC:  Alert and oriented x 3 PSYCHIATRIC:  Normal affect   ASSESSMENT:    1. Nonrheumatic aortic valve stenosis   2. Essential hypertension   3. Mixed hyperlipidemia   4. Pre-procedural cardiovascular examination    PLAN:    In order of problems listed above:  The patient has severe, stage D1 aortic stenosis.  He now has progressive symptoms with exertional dyspnea and fatigue occurring with low-level activity (NYHA functional class III).  His echocardiogram is reviewed and confirms severe aortic stenosis with findings outlined above including a mean gradient of 51 mmHg, peak velocity of 4.7, and preserved LV systolic function.  We reviewed the natural history of severe aortic stenosis today.  We discussed potential treatment options including surgical or transcatheter aortic valve replacement and palliative medical therapy.  The patient is otherwise in relatively good health and is functionally independent.  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  As long as his anatomy is suitable and he has no high-grade obstructive CAD, he will likely be a good candidate for TAVR.  We reviewed today work-up for TAVR today.  The patient will need to undergo cardiac catheterization to evaluate  for obstructive CAD. I have reviewed the risks, indications, and alternatives to cardiac catheterization, possible angioplasty, and stenting with the patient. Risks include but are not limited to bleeding, infection, vascular injury, stroke, myocardial infection, arrhythmia, kidney injury, radiation-related injury  in the case of prolonged fluoroscopy use, emergency cardiac surgery, and death. The patient understands the risks of serious complication is 1-2 in 3474 with diagnostic cardiac cath and 1-2% or less with angioplasty/stenting.   He will then undergo a gated cardiac CTA and a CTA of the chest, abdomen, and pelvis to evaluate vascular anatomy and anatomic suitability for TAVR.  Once his studies are completed, he will be referred for formal cardiac surgical consultation with Dr. Cyndia Bent as part of a multidisciplinary approach to his care.  He will also be referred for an outpatient dental consult to clear him for valve surgery. Blood pressure is well controlled on atenolol, chlorthalidone, and losartan Treated with pravastatin, managed by PCP.      Shared Decision Making/Informed Consent The risks [stroke (1 in 1000), death (1 in 1000), kidney failure [usually temporary] (1 in 500), bleeding (1 in 200), allergic reaction [possibly serious] (1 in 200)], benefits (diagnostic support and management of coronary artery disease) and alternatives of a cardiac catheterization were discussed in detail with Mr. Both and he is willing to proceed.    Medication Adjustments/Labs and Tests Ordered: Current medicines are reviewed at length with the patient today.  Concerns regarding medicines are outlined above.  Orders Placed This Encounter  Procedures   CT CORONARY MORPH W/CTA COR W/SCORE W/CA W/CM &/OR WO/CM   CT ANGIO CHEST AORTA W/CM & OR WO/CM   CT ANGIO ABDOMEN PELVIS  W &/OR WO CONTRAST   Comprehensive metabolic panel   Basic metabolic panel   EKG 25-ZDGL   No orders of the defined types were  placed in this encounter.   Patient Instructions  Medication Instructions:  Your physician recommends that you continue on your current medications as directed. Please refer to the Current Medication list given to you today.  *If you need a refill on your cardiac medications before your next appointment, please call your pharmacy*   Lab Work: CBC, BMET on 05/04/22 If you have labs (blood work) drawn today and your tests are completely normal, you will receive your results only by: [place] (if you have MyChart) OR A paper copy in the mail If you have any lab test that is abnormal or we need to change your treatment, we will call you to review the results.   Testing/Procedures: R & L heart cath Your physician has requested that you have a cardiac catheterization. Cardiac catheterization is used to diagnose and/or treat various heart conditions. Doctors may recommend this procedure for a number of different reasons. The most common reason is to evaluate chest pain. Chest pain can be a symptom of coronary artery disease (CAD), and cardiac catheterization can show whether plaque is narrowing or blocking your heart's arteries. This procedure is also used to evaluate the valves, as well as measure the blood flow and oxygen levels in different parts of your heart. For further information please visit [email or web address]. Please follow instruction sheet, as given.  Follow-Up: At [facility], you and your health needs are our priority.  As part of our continuing mission to provide you with exceptional heart care, we have created designated Provider Care Teams.  These Care Teams include your primary Cardiologist (physician) and Advanced Practice Providers (APPs -  Physician Assistants and Nurse Practitioners) who all work together to provide you with the care you need, when you need it.  We recommend signing up for the patient portal called "MyChart".  Sign up information is provided on this  After Visit Summary.  MyChart is used to connect with patients for Virtual Visits (Telemedicine).  Patients are able to view lab/test results, encounter notes, upcoming appointments, etc.  Non-urgent messages can be sent to your provider as well.   To learn more about what you can do with MyChart, go to [email or web address].    Your next appointment:   Structural team will follow up  Provider:   Sherren Mocha, MD     Other Instructions  [place] OFFICE [address] [place], [place] [address] Dept: [phone] Loc: [place]  04/23/2022  You are scheduled for a Cardiac Catheterization on Thursday, June 8 with Dr. Sherren Mocha.  1. Please arrive at the Main Entrance A at [facility]: [place], [place] [address] at 10:00 AM (This time is two hours before your procedure to ensure your preparation). Free valet parking service is available.   Special note: Every effort is made to have your procedure done on time. Please understand that emergencies sometimes delay scheduled procedures.  2. Diet: Do not eat solid foods after midnight.  You may have clear liquids until 5 AM upon the day of the procedure.  3. Labs: You will need to have blood drawn on Monday, June 5 at [facility]. [address]  Open: 7:30am - 5pm    Phone: [phone]. You do not need to be fasting.  4. Medication instructions in preparation for your procedure:   Contrast Allergy: No  Take only 30 units of insulin the night before your procedure. Do not take any insulin on the day of the procedure.  Do NOT take Losartan or Atenolol/Chlorthalidone the day before or day of procedure  Do NOT take Jardiance (or any diabetes medication) the morning of procedure   On the morning of your procedure, take Aspirin '81mg'$  and any morning medicines NOT  listed above.  You may use sips of water.  5. Plan to go home the same day, you will only stay overnight if medically necessary. 6. You MUST have a responsible adult to drive you home. 7. An adult MUST be with you the first 24 hours after you arrive home. 8. Bring a current list of your medications, and the last time and date medication taken. 9. Bring ID and current insurance cards. 10.Please wear clothes that are easy to get on and off and wear slip-on shoes.  Thank you for allowing [place] to care for you!   -- [facility]     Important Information About Sugar         Signed, Sherren Mocha, MD  04/23/2022 4:52 PM    [place]

## 2022-04-28 ENCOUNTER — Other Ambulatory Visit: Payer: Self-pay | Admitting: Internal Medicine

## 2022-04-30 ENCOUNTER — Encounter: Payer: Self-pay | Admitting: Physician Assistant

## 2022-04-30 ENCOUNTER — Other Ambulatory Visit: Payer: Self-pay | Admitting: Physician Assistant

## 2022-05-04 ENCOUNTER — Other Ambulatory Visit: Payer: Medicare Other

## 2022-05-04 ENCOUNTER — Other Ambulatory Visit: Payer: Self-pay | Admitting: Cardiovascular Disease

## 2022-05-04 ENCOUNTER — Other Ambulatory Visit: Payer: Self-pay | Admitting: *Deleted

## 2022-05-04 DIAGNOSIS — I350 Nonrheumatic aortic (valve) stenosis: Secondary | ICD-10-CM

## 2022-05-04 DIAGNOSIS — Z01810 Encounter for preprocedural cardiovascular examination: Secondary | ICD-10-CM

## 2022-05-04 LAB — COMPREHENSIVE METABOLIC PANEL
ALT: 15 IU/L (ref 0–44)
AST: 18 IU/L (ref 0–40)
Albumin/Globulin Ratio: 2.1 (ref 1.2–2.2)
Albumin: 4.6 g/dL (ref 3.6–4.6)
Alkaline Phosphatase: 86 IU/L (ref 44–121)
BUN/Creatinine Ratio: 22 (ref 10–24)
BUN: 32 mg/dL — ABNORMAL HIGH (ref 8–27)
Bilirubin Total: 0.5 mg/dL (ref 0.0–1.2)
CO2: 30 mmol/L — ABNORMAL HIGH (ref 20–29)
Calcium: 9.9 mg/dL (ref 8.6–10.2)
Chloride: 101 mmol/L (ref 96–106)
Creatinine, Ser: 1.44 mg/dL — ABNORMAL HIGH (ref 0.76–1.27)
Globulin, Total: 2.2 g/dL (ref 1.5–4.5)
Glucose: 148 mg/dL — ABNORMAL HIGH (ref 70–99)
Potassium: 3.9 mmol/L (ref 3.5–5.2)
Sodium: 140 mmol/L (ref 134–144)
Total Protein: 6.8 g/dL (ref 6.0–8.5)
eGFR: 48 mL/min/{1.73_m2} — ABNORMAL LOW (ref >59)

## 2022-05-04 LAB — CBC
Hematocrit: 48.5 % (ref 37.5–51.0)
Hemoglobin: 16.1 g/dL (ref 13.0–17.7)
MCH: 30 pg (ref 26.6–33.0)
MCHC: 33.2 g/dL (ref 31.5–35.7)
MCV: 91 fL (ref 79–97)
Platelets: 226 10*3/uL (ref 150–450)
RBC: 5.36 x10E6/uL (ref 4.14–5.80)
RDW: 14.2 % (ref 11.6–15.4)
WBC: 9.1 10*3/uL (ref 3.4–10.8)

## 2022-05-04 NOTE — Progress Notes (Signed)
Pre-cath lab order

## 2022-05-05 DIAGNOSIS — M48062 Spinal stenosis, lumbar region with neurogenic claudication: Secondary | ICD-10-CM | POA: Diagnosis not present

## 2022-05-06 ENCOUNTER — Telehealth: Payer: Self-pay | Admitting: *Deleted

## 2022-05-06 NOTE — Telephone Encounter (Addendum)
Cardiac Catheterization scheduled at [facility] for: Thursday May 07, 2022 12 Noon Arrival time and place: [facility] Main Entrance A at: 10 AM   Nothing to eat after midnight prior to procedure, clear liquids until 5 AM day of procedure.  Medication instructions: -Hold:  Losartan-day before and day of procedure-per protocol  Atenolol/Chlorthalidone-day before and day of procedure-per protocol  Jardiance-AM of procedure  Insulin-AM of procedure/1/2 usual Insulin dose HS prior to procedure -Except hold medications usual morning medications can be taken with sips of water including aspirin 81 mg.  Confirmed patient has responsible adult to drive home post procedure and be with patient first 24 hours after arriving home.  Patient reports no new symptoms concerning for COVID-19/no exposure to COVID-19 in the past 10 days.  Reviewed procedure instructions with patient, advised patient to stay well hydrated today.

## 2022-05-07 ENCOUNTER — Other Ambulatory Visit: Payer: Self-pay

## 2022-05-07 ENCOUNTER — Ambulatory Visit (HOSPITAL_COMMUNITY)
Admission: RE | Admit: 2022-05-07 | Discharge: 2022-05-07 | Disposition: A | Discharge status: Home / Self Care | Payer: Medicare Other | Attending: Cardiovascular Disease | Admitting: Cardiovascular Disease

## 2022-05-07 ENCOUNTER — Encounter (HOSPITAL_COMMUNITY)
Admission: RE | Disposition: A | Discharge status: Home / Self Care | Payer: Self-pay | Source: Home / Self Care | Attending: Cardiovascular Disease

## 2022-05-07 DIAGNOSIS — I350 Nonrheumatic aortic (valve) stenosis: Secondary | ICD-10-CM

## 2022-05-07 DIAGNOSIS — E119 Type 2 diabetes mellitus without complications: Secondary | ICD-10-CM | POA: Diagnosis not present

## 2022-05-07 DIAGNOSIS — E782 Mixed hyperlipidemia: Secondary | ICD-10-CM | POA: Insufficient documentation

## 2022-05-07 DIAGNOSIS — Z79899 Other long term (current) drug therapy: Secondary | ICD-10-CM | POA: Diagnosis not present

## 2022-05-07 DIAGNOSIS — I10 Essential (primary) hypertension: Secondary | ICD-10-CM | POA: Diagnosis not present

## 2022-05-07 DIAGNOSIS — Z87891 Personal history of nicotine dependence: Secondary | ICD-10-CM | POA: Insufficient documentation

## 2022-05-07 DIAGNOSIS — Z794 Long term (current) use of insulin: Secondary | ICD-10-CM | POA: Diagnosis not present

## 2022-05-07 DIAGNOSIS — Z7984 Long term (current) use of oral hypoglycemic drugs: Secondary | ICD-10-CM | POA: Insufficient documentation

## 2022-05-07 HISTORY — PX: RIGHT HEART CATH AND CORONARY ANGIOGRAPHY: CATH118264

## 2022-05-07 LAB — POCT I-STAT 7, (LYTES, BLD GAS, ICA,H+H)
Acid-base deficit: 1 mmol/L (ref 0.0–2.0)
Bicarbonate: 26 mmol/L (ref 20.0–28.0)
Calcium, Ion: 1.22 mmol/L (ref 1.15–1.40)
HCT: 43 % (ref 39.0–52.0)
Hemoglobin: 14.6 g/dL (ref 13.0–17.0)
O2 Saturation: 98 %
Potassium: 3.3 mmol/L — ABNORMAL LOW (ref 3.5–5.1)
Sodium: 143 mmol/L (ref 135–145)
TCO2: 28 mmol/L (ref 22–32)
pCO2 arterial: 51.5 mmHg — ABNORMAL HIGH (ref 32–48)
pH, Arterial: 7.311 — ABNORMAL LOW (ref 7.35–7.45)
pO2, Arterial: 116 mmHg — ABNORMAL HIGH (ref 83–108)

## 2022-05-07 LAB — POCT I-STAT EG7
Acid-base deficit: 1 mmol/L (ref 0.0–2.0)
Bicarbonate: 26.3 mmol/L (ref 20.0–28.0)
Calcium, Ion: 1.12 mmol/L — ABNORMAL LOW (ref 1.15–1.40)
HCT: 42 % (ref 39.0–52.0)
Hemoglobin: 14.3 g/dL (ref 13.0–17.0)
O2 Saturation: 78 %
Potassium: 3.2 mmol/L — ABNORMAL LOW (ref 3.5–5.1)
Sodium: 144 mmol/L (ref 135–145)
TCO2: 28 mmol/L (ref 22–32)
pCO2, Ven: 51.6 mmHg (ref 44–60)
pH, Ven: 7.316 (ref 7.25–7.43)
pO2, Ven: 47 mmHg — ABNORMAL HIGH (ref 32–45)

## 2022-05-07 LAB — GLUCOSE, CAPILLARY: Glucose-Capillary: 114 mg/dL — ABNORMAL HIGH (ref 70–99)

## 2022-05-07 SURGERY — RIGHT HEART CATH AND CORONARY ANGIOGRAPHY
Anesthesia: LOCAL

## 2022-05-07 MED ORDER — HEPARIN SODIUM (PORCINE) 1000 UNIT/ML IJ SOLN
INTRAMUSCULAR | Status: DC | PRN
  Administered 2022-05-07: 5000 [IU] via INTRAVENOUS

## 2022-05-07 MED ORDER — SODIUM CHLORIDE 0.9 % WEIGHT BASED INFUSION: 1.0000 mL/kg/h | INTRAVENOUS | Status: DC

## 2022-05-07 MED ORDER — SODIUM CHLORIDE 0.9% FLUSH: 3.0000 mL | INTRAVENOUS | Status: DC | PRN

## 2022-05-07 MED ORDER — HEPARIN SODIUM (PORCINE) 1000 UNIT/ML IJ SOLN
INTRAMUSCULAR | Status: AC
Start: 2022-05-07 — End: ?
  Filled 2022-05-07: qty 10

## 2022-05-07 MED ORDER — SODIUM CHLORIDE 0.9% FLUSH: 3.0000 mL | Freq: Two times a day (BID) | INTRAVENOUS | Status: DC

## 2022-05-07 MED ORDER — SODIUM CHLORIDE 0.9 % IV SOLN: 250.0000 mL | INTRAVENOUS | Status: DC | PRN

## 2022-05-07 MED ORDER — ONDANSETRON HCL 4 MG/2ML IJ SOLN: 4.0000 mg | Freq: Four times a day (QID) | INTRAMUSCULAR | Status: DC | PRN

## 2022-05-07 MED ORDER — LIDOCAINE HCL (PF) 1 % IJ SOLN
INTRAMUSCULAR | Status: DC | PRN
  Administered 2022-05-07 (×2): 2 mL via INTRADERMAL

## 2022-05-07 MED ORDER — SODIUM CHLORIDE 0.9 % WEIGHT BASED INFUSION
3.0000 mL/kg/h | INTRAVENOUS | Status: AC
  Administered 2022-05-07: 3 mL/kg/h via INTRAVENOUS

## 2022-05-07 MED ORDER — HEPARIN (PORCINE) IN NACL 1000-0.9 UT/500ML-% IV SOLN
INTRAVENOUS | Status: AC
Start: 2022-05-07 — End: ?
  Filled 2022-05-07: qty 1000

## 2022-05-07 MED ORDER — LABETALOL HCL 5 MG/ML IV SOLN: 10.0000 mg | INTRAVENOUS | Status: DC | PRN

## 2022-05-07 MED ORDER — LIDOCAINE HCL (PF) 1 % IJ SOLN
INTRAMUSCULAR | Status: AC
  Filled 2022-05-07: qty 30

## 2022-05-07 MED ORDER — VERAPAMIL HCL 2.5 MG/ML IV SOLN
INTRAVENOUS | Status: DC | PRN
  Administered 2022-05-07: 10 mL via INTRA_ARTERIAL

## 2022-05-07 MED ORDER — HYDRALAZINE HCL 20 MG/ML IJ SOLN: 10.0000 mg | INTRAMUSCULAR | Status: DC | PRN

## 2022-05-07 MED ORDER — ASPIRIN 81 MG PO CHEW: 81.0000 mg | CHEWABLE_TABLET | ORAL | Status: DC

## 2022-05-07 MED ORDER — MIDAZOLAM HCL 2 MG/2ML IJ SOLN
INTRAMUSCULAR | Status: AC
  Filled 2022-05-07: qty 2

## 2022-05-07 MED ORDER — IOHEXOL 350 MG/ML SOLN
INTRAVENOUS | Status: DC | PRN
  Administered 2022-05-07: 45 mL

## 2022-05-07 MED ORDER — VERAPAMIL HCL 2.5 MG/ML IV SOLN
INTRAVENOUS | Status: AC
  Filled 2022-05-07: qty 2

## 2022-05-07 MED ORDER — HEPARIN (PORCINE) IN NACL 1000-0.9 UT/500ML-% IV SOLN
INTRAVENOUS | Status: DC | PRN
  Administered 2022-05-07 (×2): 500 mL

## 2022-05-07 MED ORDER — ACETAMINOPHEN 325 MG PO TABS: 650.0000 mg | ORAL_TABLET | ORAL | Status: DC | PRN

## 2022-05-07 MED ORDER — MIDAZOLAM HCL 2 MG/2ML IJ SOLN
INTRAMUSCULAR | Status: DC | PRN
  Administered 2022-05-07: 2 mg via INTRAVENOUS

## 2022-05-07 SURGICAL SUPPLY — 12 items
BAND CMPR LRG ZPHR (HEMOSTASIS) ×1
BAND ZEPHYR COMPRESS 30 LONG (HEMOSTASIS) ×1 IMPLANT
CATH 5FR JL3.5 JR4 ANG PIG MP (CATHETERS) ×1 IMPLANT
CATH BALLN WEDGE 5F 110CM (CATHETERS) ×1 IMPLANT
GLIDESHEATH SLEND SS 6F .021 (SHEATH) ×1 IMPLANT
GUIDEWIRE INQWIRE 1.5J.035X260 (WIRE) IMPLANT
INQWIRE 1.5J .035X260CM (WIRE) ×2
KIT HEART LEFT (KITS) ×2 IMPLANT
PACK CARDIAC CATHETERIZATION (CUSTOM PROCEDURE TRAY) ×2 IMPLANT
SHEATH GLIDE SLENDER 4/5FR (SHEATH) ×1 IMPLANT
TRANSDUCER W/STOPCOCK (MISCELLANEOUS) ×2 IMPLANT
TUBING CIL FLEX 10 FLL-RA (TUBING) ×2 IMPLANT

## 2022-05-07 NOTE — Progress Notes (Signed)
   Staff RN called reporting patient with purple discoloration of bilateral hands post cath, after releasing TR band and hands are no longer purple. Examined the patient at bedside, feeling well. Wife reports patient had hx of purple discoloration of hands when it comes to cold temperature. Right hand examined, cool to touch, radial pulse 2+, strong grip, denied any pain/numbness/tingling sensation, no bleeding or hematoma. No contraindication for discharge, advised patient to see Dr Burt Knack at follow up appointment as arranged.

## 2022-05-07 NOTE — Progress Notes (Signed)
On arrival from cath lab, client's fingers on both hands purple and spo2 pleth right hand dampened. Air removed from band and fingers less purple

## 2022-05-07 NOTE — Interval H&P Note (Signed)
History and Physical Interval Note:  05/07/2022 11:17 AM  Ryan Duke  has presented today for surgery, with the diagnosis of Aortic Stenosis.  The various methods of treatment have been discussed with the patient and family. After consideration of risks, benefits and other options for treatment, the patient has consented to  Procedure(s): RIGHT/LEFT HEART CATH AND CORONARY ANGIOGRAPHY (N/A) as a surgical intervention.  The patient's history has been reviewed, patient examined, no change in status, stable for surgery.  I have reviewed the patient's chart and labs.  Questions were answered to the patient's satisfaction.     Sherren Mocha

## 2022-05-08 ENCOUNTER — Other Ambulatory Visit: Payer: Self-pay

## 2022-05-08 ENCOUNTER — Encounter (HOSPITAL_COMMUNITY): Payer: Self-pay | Admitting: Cardiovascular Disease

## 2022-05-08 DIAGNOSIS — I350 Nonrheumatic aortic (valve) stenosis: Secondary | ICD-10-CM

## 2022-05-10 ENCOUNTER — Other Ambulatory Visit: Payer: Self-pay | Admitting: Internal Medicine

## 2022-05-10 DIAGNOSIS — E118 Type 2 diabetes mellitus with unspecified complications: Secondary | ICD-10-CM

## 2022-05-10 DIAGNOSIS — N1831 Chronic kidney disease, stage 3a: Secondary | ICD-10-CM

## 2022-05-11 ENCOUNTER — Ambulatory Visit (INDEPENDENT_AMBULATORY_CARE_PROVIDER_SITE_OTHER): Payer: Medicare Other | Admitting: Dentistry

## 2022-05-11 ENCOUNTER — Encounter (HOSPITAL_COMMUNITY): Payer: Self-pay | Admitting: Dentistry

## 2022-05-11 VITALS — BP 168/61 | HR 46 | Temp 98.0°F

## 2022-05-11 DIAGNOSIS — K045 Chronic apical periodontitis: Secondary | ICD-10-CM | POA: Insufficient documentation

## 2022-05-11 DIAGNOSIS — Z01818 Encounter for other preprocedural examination: Secondary | ICD-10-CM | POA: Insufficient documentation

## 2022-05-11 DIAGNOSIS — K011 Impacted teeth: Secondary | ICD-10-CM | POA: Insufficient documentation

## 2022-05-11 DIAGNOSIS — K0850 Unsatisfactory restoration of tooth, unspecified: Secondary | ICD-10-CM | POA: Insufficient documentation

## 2022-05-11 DIAGNOSIS — F40232 Fear of other medical care: Secondary | ICD-10-CM | POA: Insufficient documentation

## 2022-05-11 DIAGNOSIS — K047 Periapical abscess without sinus: Secondary | ICD-10-CM | POA: Insufficient documentation

## 2022-05-11 DIAGNOSIS — K036 Deposits [accretions] on teeth: Secondary | ICD-10-CM | POA: Insufficient documentation

## 2022-05-11 DIAGNOSIS — K029 Dental caries, unspecified: Secondary | ICD-10-CM | POA: Insufficient documentation

## 2022-05-11 DIAGNOSIS — K08109 Complete loss of teeth, unspecified cause, unspecified class: Secondary | ICD-10-CM | POA: Insufficient documentation

## 2022-05-11 DIAGNOSIS — K083 Retained dental root: Secondary | ICD-10-CM | POA: Insufficient documentation

## 2022-05-11 DIAGNOSIS — I350 Nonrheumatic aortic (valve) stenosis: Secondary | ICD-10-CM

## 2022-05-11 DIAGNOSIS — K0530 Chronic periodontitis, unspecified: Secondary | ICD-10-CM | POA: Insufficient documentation

## 2022-05-11 NOTE — Patient Instructions (Signed)
[facility] DEPARTMENT OF DENTAL MEDICINE Dr. Yaslene Lindamood B. Sherolyn Trettin, DMD Phone: [phone] Fax: [phone]       It was a pleasure seeing you today!  Please refer to the information below regarding your dental visit with us.  Please do not hesitate to give us a call if any questions or concerns come up after you leave.    Thank you for letting us provide care for you.  If there is anything we can do for you, please let us know.    HEART VALVES AND MOUTH CARE  FACTS: If you have any infection in your mouth, it can infect your heart valve. If you heart valve is infected, you will be seriously ill. Infections in the mouth can be SILENT and do not always cause pain. Examples of infections in the mouth are gum disease, dental cavities and abscesses. Some possible signs of infection are:  Bad breath, bleeding gums, or teeth that are sensitive to sweets, hot, and/or cold. There are many other signs as well.     WHAT YOU HAVE TO DO: Brush your teeth after meals and at bedtime.  Spend at least 2 minutes brushing well, especially behind your back teeth and all around your teeth that stand alone.  Brush at the gumline also. Do not go to bed without brushing your teeth and flossing.  If your gums bleed when you brush or floss, do NOT stop brushing or flossing.  Bleeding can be a sign of inflammation or irritation from bacteria.  It usually means that your gums need more attention and better cleaning.  If your dentist or Dr. Alessio Bogan gave you a prescription mouthwash to use, make sure to use it as directed. If you run out of the medication, notify your pharmacy. If you were given any other medications or directions by your dentist, please follow them.  If you did not understand the directions or forget what you were told, please call.  We will be happy to refresh your memory. If you need antibiotics before dental procedures, make sure you take them one hour prior to  every dental visit as directed.  Get a dental check-up every 4-6 months in order to keep your mouth healthy, or to find and treat any new infection. You will most likely need your teeth cleaned or gums treated at the same time. If you are not able to come in for your scheduled appointment, call your dentist as soon as possible to reschedule. If you have a problem in between dental visits, call your dentist.    WE ARE A TEAM.  OUR GOAL IS: HEALTHY MOUTH, HEALTHY HEART    Questions?  Call our office during office hours at [phone].   

## 2022-05-11 NOTE — Progress Notes (Signed)
Department of Dental Medicine   Service Date:   05/11/2022  Patient Name:  Ryan Duke Date of Birth:   [birth year] Medical Record Number: [id number]  Referring Provider:           Sherren Mocha, M.D.   OUTPATIENT CONSULTATION PLAN/RECOMMENDATIONS   ASSESSMENT: Severe dental caries, retained tooth roots, w/ chronic periapical infections Periodontal concerns including heavy accretions on teeth & inflamed gingival tissue  RECOMMENDATIONS: Extractions of all indicated teeth to decrease the risk of perioperative and postoperative systemic infection and complications.    PLAN: Discuss case with medical team. Extractions of indicated teeth in the O.R. under general anesthesia on 6/15 @ 1115 (pending any recommendations from medical team).  Discussed in detail all treatment options & recommendations with the patient & they are agreeable to the plan.    Thank you for consulting with [facility] and for the opportunity to participate in this patient's treatment.  Should you have any questions or concerns, please contact the [facility] at [phone].       05/11/2022    CONSULT NOTE:  HISTORY OF PRESENT ILLNESS: Ryan Duke is a very pleasant 81 y.o. male with h/o pancreatitis, type 2 DM, HTN, hyperlipidemia, renal insufficiency, GERD, peripheral vascular disease, BPH and obesity who was recently diagnosed with severe aortic stenosis and is anticipating TAVR.   The patient presents today for a medically necessary dental consultation as part of their pre-cardiac surgery work-up.   DENTAL HISTORY: The patient reports that it has been several decades since he has last seen a dentist. He reports going when he was a teenager to have work done, but he has not gone regularly because he has not has any problems or pain with his teeth. He currently denies any dental/orofacial pain or sensitivity. Patient is able to manage oral secretions.  Patient denies dysphagia,  odynophagia, dysphonia, SOB and neck pain.  Patient denies fever, rigors and malaise.   CHIEF COMPLAINT:  Here for a preoperative dental exam.   Patient Active Problem List   Diagnosis Date Noted   Severe aortic stenosis    Onychomycosis of toenail 12/04/2019   OAB (overactive bladder) 06/08/2019   Vitamin D deficiency 02/15/2018   Bilateral low back pain with sciatica 11/01/2017   Mild intermittent asthma without complication 21/30/8657   Primary gout 07/07/2016   Routine general medical examination at a health care facility 09/18/2011   GERD 07/22/2010   BPH (benign prostatic hyperplasia) 09/02/2009   Kidney disease, chronic, 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) (HCC) 02/26/2009   Aortic valve disorder 02/25/2009   PERIPHERAL VASCULAR DISEASE 02/25/2009   Type II diabetes mellitus with manifestations ([place]) 11/01/2007   Hyperlipidemia with target LDL less than 100 07/01/2007   Morbid obesity ([place]) 07/01/2007   Essential hypertension 07/01/2007   Past Medical History:  Diagnosis Date   Diabetes mellitus    type 2   Hyperlipidemia    Hypertension    Hypogonadism, male    Pancreatitis 2008   Renal insufficiency    Past Surgical History:  Procedure Laterality Date   EYE SURGERY     cataracts   FLEXIBLE SIGMOIDOSCOPY N/A 09/10/2017   Procedure: FLEXIBLE SIGMOIDOSCOPY;  Surgeon: Mauri Pole, MD;  Location: [facility];  Service: Endoscopy;  Laterality: N/A;   IR KYPHO LUMBAR INC FX REDUCE BONE BX UNI/BIL CANNULATION INC/IMAGING  09/14/2017   IR RADIOLOGIST EVAL & MGMT  10/07/2017   RIGHT HEART CATH AND CORONARY ANGIOGRAPHY N/A 05/07/2022  Procedure: RIGHT HEART CATH AND CORONARY ANGIOGRAPHY;  Surgeon: Sherren Mocha, MD;  Location: [facility];  Service: Cardiovascular;  Laterality: N/A;   Allergies  Allergen Reactions   Farxiga [Dapagliflozin] Other (See Comments)    gout   Metformin And Related Other (See Comments)    acidosis   Atorvastatin Other (See  Comments)    REACTION: h/a   Benazepril Hcl Other (See Comments)    Was not effective   Flexeril [Cyclobenzaprine] Other (See Comments)    HALLUSINATIONS   Oxycodone Other (See Comments)    HALLUSINATIONS   Current Outpatient Medications  Medication Sig Dispense Refill   atenolol-chlorthalidone (TENORETIC) 100-25 MG tablet TAKE ONE TABLET BY MOUTH ONCE DAILY 90 tablet 1   Continuous Blood Gluc Receiver (FREESTYLE LIBRE 2 READER) DEVI 1 Act by Does not apply route daily. 2 each 5   Continuous Blood Gluc Sensor (FREESTYLE LIBRE 2 SENSOR) MISC USE AS DIRECTED AND REPLACE EVERY 14 DAYS. 2 each 5   DULoxetine (CYMBALTA) 60 MG capsule TAKE ONE CAPSULE DAILY 90 capsule 1   glucagon (GLUCAGEN HYPOKIT) 1 MG SOLR injection Inject 1 mg into the vein once as needed for up to 1 dose for low blood sugar. (Patient not taking: Reported on 04/30/2022) 2 each 2   Insulin Glargine-Lixisenatide (SOLIQUA) 100-33 UNT-MCG/ML SOPN Inject 60 Units into the skin daily. (Patient taking differently: Inject 50 Units into the skin daily with breakfast.) 54 mL 1   Insulin Pen Needle (NOVOFINE) 32G X 6 MM MISC 1 Act by Does not apply route daily. 100 each 1   JARDIANCE 25 MG TABS tablet TAKE ONE TABLET ONCE DAILY BEFORE BREAKFAST. 90 tablet 1   KERENDIA 20 MG TABS Take 1 tablet by mouth daily. 90 tablet 0   losartan (COZAAR) 100 MG tablet Take 1 tablet (100 mg total) by mouth daily. for high blood pressure 90 tablet 1   Multiple Vitamin (MULTIVITAMIN WITH MINERALS) TABS tablet Take 1 tablet by mouth 2 (two) times a week.     Multiple Vitamins-Minerals (PRESERVISION AREDS PO) Take 1 tablet by mouth in the morning and at bedtime.     MYRBETRIQ 50 MG TB24 tablet TAKE 1 TABLET ONCE DAILY. 90 tablet 1   pravastatin (PRAVACHOL) 40 MG tablet TAKE ONE TABLET BY MOUTH ONCE DAILY. 90 tablet 1   pregabalin (LYRICA) 150 MG capsule Take 150 mg by mouth 2 (two) times daily.     tamsulosin (FLOMAX) 0.4 MG CAPS capsule TAKE ONE CAPSULE BY  MOUTH DAILY 90 capsule 1   vitamin B-12 (CYANOCOBALAMIN) 500 MCG tablet Take 500 mcg by mouth daily.     No current facility-administered medications for this visit.    LABS: Lab Results  Component Value Date   WBC 9.1 05/04/2022   HGB 14.3 05/07/2022   HGB 14.6 05/07/2022   HCT 42.0 05/07/2022   HCT 43.0 05/07/2022   MCV 91 05/04/2022   PLT 226 05/04/2022      Component Value Date/Time   NA 144 05/07/2022 1303   NA 143 05/07/2022 1303   NA 140 05/04/2022 1439   K 3.2 (L) 05/07/2022 1303   K 3.3 (L) 05/07/2022 1303   CL 101 05/04/2022 1439   CO2 30 (H) 05/04/2022 1439   GLUCOSE 148 (H) 05/04/2022 1439   GLUCOSE 63 (L) 03/10/2022 1132   GLUCOSE 169 (H) 10/06/2006 0741   BUN 32 (H) 05/04/2022 1439   CREATININE 1.44 (H) 05/04/2022 1439   CALCIUM 9.9 05/04/2022 1439  GFRNONAA 51 (L) 01/27/2019 1502   GFRAA 60 (L) 01/27/2019 1502   Lab Results  Component Value Date   INR 1.04 09/14/2017   No results found for: "PTT"  Social History   Socioeconomic History   Marital status: Married    Spouse name: Not on file   Number of children: Not on file   Years of education: Not on file   Highest education level: Not on file  Occupational History   Occupation: retired  Tobacco Use   Smoking status: Former    Types: Cigarettes    Quit date: 11/30/1974    Years since quitting: 47.4    Passive exposure: Never   Smokeless tobacco: Never  Vaping Use   Vaping Use: Never used  Substance and Sexual Activity   Alcohol use: Not Currently    Alcohol/week: 0.0 standard drinks of alcohol   Drug use: No   Sexual activity: Yes  Other Topics Concern   Not on file  Social History Narrative   Regular exercise-yes   Social Determinants of Health   Financial Resource Strain: Low Risk  (03/02/2022)   Overall Financial Resource Strain (CARDIA)    Difficulty of Paying Living Expenses: Not hard at all  Food Insecurity: No Food Insecurity (03/02/2022)   Hunger Vital Sign    Worried  About Running Out of Food in the Last Year: Never true    [place] in the Last Year: Never true  Transportation Needs: No Transportation Needs (03/02/2022)   PRAPARE - Hydrologist (Medical): No    Lack of Transportation (Non-Medical): No  Physical Activity: Insufficiently Active (03/02/2022)   Exercise Vital Sign    Days of Exercise per Week: 2 days    Minutes of Exercise per Session: 20 min  Stress: No Stress Concern Present (03/02/2022)   [place]    Feeling of Stress : Not at all  Social Connections: Moderately Isolated (03/02/2022)   Social Connection and Isolation Panel [NHANES]    Frequency of Communication with Friends and Family: Twice a week    Frequency of Social Gatherings with Friends and Family: Twice a week    Attends Religious Services: Never    Marine scientist or Organizations: No    Attends Archivist Meetings: Never    Marital Status: Married  Human resources officer Violence: Not At Risk (03/02/2022)   Humiliation, Afraid, Rape, and Kick questionnaire    Fear of Current or Ex-Partner: No    Emotionally Abused: No    Physically Abused: No    Sexually Abused: No   Family History  Problem Relation Age of Onset   Hypertension Other      REVIEW OF SYSTEMS:  Reviewed with the patient as per HPI. PSYCH:  [+] Dental phobia   VITAL SIGNS: BP (!) 168/61 (BP Location: Right Arm, Patient Position: Sitting, Cuff Size: Normal)   Pulse (!) 46   Temp 98 F (36.7 C) (Oral)    PHYSICAL EXAM: GENERAL:  Well-developed, comfortable and in no apparent distress. NEUROLOGICAL:  Alert and oriented to person, place and  time. EXTRAORAL:  Facial symmetry present without any edema or erythema.  No swelling or lymphadenopathy.  TMJ asymptomatic without clicks or crepitations.  INTRAORAL:  Soft tissues appear well-perfused and mucous membranes moist.  FOM and vestibules soft  and not raised. Oral cavity without mass or lesion. [+] Soft tissues:  Parulis noted on buccal  gingiva of #14 retained root tip   DENTAL EXAM: Hard tissue exam completed and charted.    OVERALL IMPRESSION:  Fair-poor remaining dentition.       ORAL HYGIENE:  Poor      PERIODONTAL:  Inflamed, erythematous gingival tissue. Generalized plaque and calculus accumulation. CARIES:  #2, #3, #4, #5, #10, #12, #14, #15, #20, #21, #22, #23, #24, #25, #26, #27, #28 RETAINED ROOT TIPS:  #5, #14, #15, #20 DEFECTIVE RESTORATIONS:  #2 existing amalgam w/ severe recurrent decay, #3 crown w/ severe recurrent decay, #10 existing resin restoration w/ recurrent decay, #12 PFM crown w/ possible recurrent decay vs open margin on mesial ENDODONTICS:   Previously RCT teeth numbers 3, 11, 18 & 20.  #3 and #20 failed RCT w/ severe decay and recurrent periapical infection PROSTHODONTICS:  PFM crowns on #3, #11, #12, #18 & #19.  3-unit PFM bridge (x2) spanning from teeth #6 - #9 & #29 - #31. OCCLUSION:  Unable to assess occlusion. Non-functional teeth:  #12, #18, #19 Supra-erupted teeth:  #12 OTHER FINDINGS:   [+] Attrition/wear:  #23, #24, #25, #26 incisal [+] Impacted teeth:  #1, #16, #17 and #32   RADIOGRAPHIC EXAM:  PAN and Full Mouth Series exposed and interpreted.   Condyles seated bilaterally in fossas.  No evidence of abnormal pathology.  All visualized osseous structures appear WNL.  Missing teeth #'s 7, 8, 13 & 30.  Impacted 3rd molars 1, 16, 17 & 32.  Tooth #12 appears to be slightly supra-erupted.   Localized mild horizontal bone loss consistent with mild periodontitis. Radiographic calculus evident.   Retained root tips #5, #14, #15 & #20. Teeth #'s 2, 3, 5, 14, 15 & 20 have periapical radiolucencies.  Severe decay approximating the pulp on teeth #'s 2, 3 & 4.  Caries:  #61M, #53M&D, #24M&D, #23D, #24D&I, #25I, #16M&D, #48M & #28D. Existing restorations evident on teeth #'s 2, 10 & 27; #2  defective resto w/ recurrent decay, #10 w/ recurrent decay.  Full-coverage crowns on #'s 3, 11, 12, 18 & 19; severe recurrent decay under margins of #3. #12 appears to have shadowing/possible recurrent decay under mesial margin.  3-unit bridges (x 2)- ((1))  #6-#9 w/ #6 & #9 as retainers & 1 pontic replacing teeth #'s 7 & 8 and ((2)) #29-#31 w/ #30 as pontic.  Previous endodontic therapy on teeth #'s 3, 11, 18 & 20; #3 & #20 failed endo tx w/ decay/periapical radiolucencies.  #11 has post placement.  #11 & #18 appear to have adequate gutta percha fill w/ no obvious voids ~1-2 mm short of apices.   ASSESSMENT:  1.  Severe aortic stenosis 2.  Preoperative dental exam 3.  Caries 4.  Missing teeth 5.  Retained root tips 6.  Chronic apical periodontitis 7.  Chronic periodontitis 8.  Postoperative bleeding risk 9.  Periapical abscess w/ sinus 10.  Accretions on teeth 11.  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  I explained that if any chronic or acute dental/oral infection(s) are addressed and subsequently not maintained following medical optimization and recovery, their risk of the previously mentioned complications are just as high and could potentially occur postoperatively.  I explained all significant findings of the dental consultation with the patient including several teeth with severe cavities that are not restorable, retained root tips that are underneath his gums causing chronic infection and heavy calculus or tartar build-up on his teeth causing inflamed and bleeding gingival tissues, and the  recommended care including extractions of all grossly decayed and/or chronically infected teeth (including but not limited to teeth numbers 2, 3, 5, 14, 15 and 20) and a full mouth debridement in order to optimize them for heart surgery from a dental standpoint.  The patient verbalized understanding of all findings, discussion, and recommendations. We then discussed various treatment options to include no treatment, multiple extractions with alveoloplasty, pre-prosthetic surgery as indicated, periodontal therapy, dental restorations, root canal therapy, crown and bridge therapy, implant therapy, and replacement of missing teeth as indicated.  The patient verbalized understanding of all options, and currently wishes to proceed with recommended treatment plan and extract all indicated teeth and cleaning/debridement of his remaining teeth in the operating room under general anesthesia. The patient is aware that during the dental procedure, his teeth/oral cavity will be reexamined after prior to extractions/debridement and that there may be more teeth that will require extraction if they have a hopeless/questionable prognosis. Recommend all dental treatment be completed in the operating room under general anesthesia due to the patient's complex medical history, dental phobia and the amount of work needed to be completed to prepare the patient for cardiac surgery. Plan to discuss all findings and recommendations with medical team and coordinate future care as needed.  The patient spoke with his wife over the phone during appointment, and we were able to get him scheduled for extractions in O.R. this Thursday (6/15) at 11:15AM. The patient will need to establish care at a dental office of his choice for routine dental care including replacement of missing teeth as needed, cleanings and exams.  All questions and concerns were invited and addressed.  The patient tolerated today's visit well and departed in stable  condition.  I spent in excess of 120 minutes during the conduct of this consultation and >50% of this time involved direct face-to-face encounter for counseling and/or coordination of the patient's care.   Charlaine Dalton, D.M.D.

## 2022-05-12 ENCOUNTER — Encounter (HOSPITAL_COMMUNITY): Payer: Self-pay | Admitting: Dentistry

## 2022-05-12 ENCOUNTER — Other Ambulatory Visit: Payer: Self-pay

## 2022-05-12 NOTE — Progress Notes (Signed)
PCP - Dr. Ronnald Ramp, T.  Cardiologist - Dr. Burt Knack, Jerilynn Mages.  EP- Denies  Endocrine- Denies  Pulm- Denies  Chest x-ray - Denies  EKG - 04/23/22 (E)  Stress Test - 02/28/2009 (E)  ECHO - 04/14/22 (E)  Cardiac Cath - 05/07/22 (E)  AICD-na PM-na LOOP-na  Nerve Stimulator- Denies  Dialysis- Denies  Sleep Study - Denies CPAP - Denies  LABS- 05/04/22(E): CBC, CMP  ASA- Denies  ERAS- No  HA1C- 03/10/22(E): 8.1 Fasting Blood Sugar - 112-210 Checks Blood Sugar ___5+__ times a day- continuous meter to Left Upper Arm  Anesthesia- Yes- recent cardiac cath-6/8, A1C   Pt denies having chest pain, sob, or fever during the pre-op phone call. All instructions explained to the pt, with a verbal understanding of the material including: as of today,  stop taking all Aspirin (unless instructed by your doctor) and Other Aspirin containing products, Vitamins, Fish oils, and Herbal medications. Also stop all NSAIDS i.e. Advil, Ibuprofen, Motrin, Aleve, Anaprox, Naproxen, BC, Goody Powders, and all Supplements.    WHAT DO I DO ABOUT MY DIABETES MEDICATION?  Take last dose of Jardiance on 05/12/22. Do not take Jardiance and Kerendia the morning of surgery.    THE MORNING OF SURGERY, take ______0_______ units of ___Soliqua_______insulin.  If your CBG is greater than 220 mg/dL, call the number below for further instructions.  How do I manage my blood sugar before surgery? Check your blood sugar at least 4 times a day, starting 2 days before surgery, to make sure that the level is not too high or low. Check your blood sugar the morning of your surgery when you wake up and every 2 hours until you get to the Short Stay unit. If your blood sugar is less than 70 mg/dL, you will need to treat for low blood sugar: Do not take insulin. Treat a low blood sugar (less than 70 mg/dL) with  cup of clear juice (cranberry or Bow), 4 glucose tablets, OR glucose gel. Recheck blood sugar in 15 minutes after treatment  (to make sure it is greater than 70 mg/dL). If your blood sugar is not greater than 70 mg/dL on recheck, call [phone]  for further instructions. Report your blood sugar to the short stay nurse when you get to Short Stay.  Reviewed and Endorsed by [facility] Patient Education Committee, August 2015  Pt also instructed to wear a mask and social distance of he goes out. The opportunity to ask questions was provided.

## 2022-05-13 ENCOUNTER — Encounter (HOSPITAL_COMMUNITY): Payer: Self-pay | Admitting: Dentistry

## 2022-05-13 NOTE — Progress Notes (Signed)
Anesthesia Chart Review: Ryan Duke   Case: [id number] Date/Time: 05/14/22 1115   Procedure: DENTAL RESTORATION/EXTRACTIONS   Anesthesia type: General   Pre-op diagnosis: DENTAL CARRIES   Location: [facility] OR ROOM 08 / [place] [facility]   Surgeons: Charlaine Dalton, DMD       DISCUSSION: Patient is an 81 year old male scheduled for the above procedure. He has severe AS and is being consisted for future TAVR. Pre-cardiac surgery dental procedure recommended following 05/11/22 consultation. Last evaluation by cardiologist Dr. Burt Knack on 04/23/22.  History includes former smoker (quit 11/30/74), HTN, HLD, severe AS, DM2, CKD, pancreatitis (2008), obesity. Widely patent coronaries 05/07/22.  He is a same day work-up. He had labs on 05/07/22 when he had preoperative RHC/LHC. Anesthesia team to evaluate on the day of surgery.   VS: Ht '5\' 11"'$  (1.803 m)   Wt 105.7 kg   BMI 32.50 kg/m  BP Readings from Last 3 Encounters:  05/11/22 (!) 168/61  05/07/22 113/71  04/23/22 118/70   Pulse Readings from Last 3 Encounters:  05/11/22 (!) 46  05/07/22 60  04/23/22 (!) 58     PROVIDERS: Janith Lima, MD is PCP  Sherren Mocha, MD is cardiologist   LABS: Most recent lab results include: Lab Results  Component Value Date   WBC 9.1 05/04/2022   HGB 14.3 05/07/2022   HGB 14.6 05/07/2022   HCT 42.0 05/07/2022   HCT 43.0 05/07/2022   PLT 226 05/04/2022   GLUCOSE 148 (H) 05/04/2022   ALT 15 05/04/2022   AST 18 05/04/2022   NA 144 05/07/2022   NA 143 05/07/2022   K 3.2 (L) 05/07/2022   K 3.3 (L) 05/07/2022   CL 101 05/04/2022   CREATININE 1.44 (H) 05/04/2022   BUN 32 (H) 05/04/2022   CO2 30 (H) 05/04/2022   TSH 2.96 12/04/2021   HGBA1C 8.1 (H) 03/10/2022   MICROALBUR 20.2 (H) 12/04/2021      IMAGING: He has pending CTA chest/abd/pelvis, CT cardiac that have been ordered as part of his TAVR work-up.   EKG: 04/23/22: Sinus bradycardia at 58 bpm. Sinus arrhythmia with first-degree AV block Left  axis deviation Voltage criteria for left ventricular perjury Nonspecific T wave abnormality   CV: RHC/LHC 05/07/22:   There is severe aortic valve stenosis.   1.  Widely patent coronary arteries with no significant stenoses 2.  Known severe aortic stenosis with heavy calcification and leaflet restriction seen on plain fluoroscopy 3.  Normal right heart pressures and hemodynamics.   PLAN: Continue TAVR evaluation    Echo 04/14/22: IMPRESSIONS   1. Left ventricular ejection fraction, by estimation, is >75%. The left  ventricle has hyperdynamic function. The left ventricle has no regional  wall motion abnormalities. There is moderate asymmetric left ventricular  hypertrophy of the basal-septal  segment. Left ventricular diastolic parameters are consistent with Grade I  diastolic dysfunction (impaired relaxation).   2. Right ventricular systolic function is normal. The right ventricular  size is normal. Tricuspid regurgitation signal is inadequate for assessing  PA pressure.   3. Left atrial size was mildly dilated.   4. Right atrial size was mildly dilated.   5. The mitral valve is abnormal. Trivial mitral valve regurgitation.   6. The aortic valve has an indeterminant number of cusps. There is severe  calcifcation of the aortic valve. Aortic valve regurgitation is mild.  Severe aortic valve stenosis. Aortic valve area, by VTI measures 0.97 cm.  Aortic valve mean gradient  measures 51.0 mmHg. Aortic valve Vmax measures 4.70 m/s. Peak gradient  88.4 mmHg. Based on an LVOT diameter of 2.1 cm. DI is 0.28.   7. Aortic dilatation noted. There is borderline dilatation of the aortic  root, measuring 39 mm. There is mild dilatation of the ascending aorta,  measuring 41 mm.   8. The inferior vena cava is normal in size with <50% respiratory  variability, suggesting right atrial pressure of 8 mmHg.   Comparison(s): 10/15/21 EF 70-75%. Severe AS 30mHg mean PG, 861mg peak  PG. Mild AI.     Past Medical History:  Diagnosis Date   Aortic stenosis    severe AS 04/14/22   Diabetes mellitus    type 2   Hyperlipidemia    Hypertension    Hypogonadism, male    Pancreatitis 11/30/2006   Renal insufficiency     Past Surgical History:  Procedure Laterality Date   CARDIAC CATHETERIZATION     EYE SURGERY     cataracts   FLEXIBLE SIGMOIDOSCOPY N/A 09/10/2017   Procedure: FLEXIBLE SIGMOIDOSCOPY;  Surgeon: NaMauri PoleMD;  Location: [facility];  Service: Endoscopy;  Laterality: N/A;   IR KYPHO LUMBAR INC FX REDUCE BONE BX UNI/BIL CANNULATION INC/IMAGING  09/14/2017   IR RADIOLOGIST EVAL & MGMT  10/07/2017   RIGHT HEART CATH AND CORONARY ANGIOGRAPHY N/A 05/07/2022   Procedure: RIGHT HEART CATH AND CORONARY ANGIOGRAPHY;  Surgeon: CoSherren MochaMD;  [facility];  Service: Cardiovascular;  Laterality: N/A;   TONSILLECTOMY      MEDICATIONS: No current facility-administered medications for this encounter.    atenolol-chlorthalidone (TENORETIC) 100-25 MG tablet   Continuous Blood Gluc Receiver (FREESTYLE LIBRE 2 READER) DEVI   Continuous Blood Gluc Sensor (FREESTYLE LIBRE 2 SENSOR) MISC   DULoxetine (CYMBALTA) 60 MG capsule   glucagon (GLUCAGEN HYPOKIT) 1 MG SOLR injection   Insulin Glargine-Lixisenatide (SOLIQUA) 100-33 UNT-MCG/ML SOPN   Insulin Pen Needle (NOVOFINE) 32G X 6 MM MISC   JARDIANCE 25 MG TABS tablet   KERENDIA 20 MG TABS   losartan (COZAAR) 100 MG tablet   Multiple Vitamin (MULTIVITAMIN WITH MINERALS) TABS tablet   Multiple Vitamins-Minerals (PRESERVISION AREDS PO)   MYRBETRIQ 50 MG TB24 tablet   pravastatin (PRAVACHOL) 40 MG tablet   pregabalin (LYRICA) 150 MG capsule   tamsulosin (FLOMAX) 0.4 MG CAPS capsule   vitamin B-12 (CYANOCOBALAMIN) 500 MCG tablet    AlMyra GianottiPA-C Surgical Short Stay/Anesthesiology [facility] ([phone] ([phone]/14/2023 10:44 AM

## 2022-05-13 NOTE — Anesthesia Preprocedure Evaluation (Addendum)
Anesthesia Evaluation  Patient identified by MRN, date of birth, ID band Patient awake    Reviewed: Allergy & Precautions, H&P , NPO status , Patient's Chart, lab work & pertinent test results, reviewed documented beta blocker date and time   Airway Mallampati: II  TM Distance: >3 FB Neck ROM: Full    Dental no notable dental hx. (+) Teeth Intact, Dental Advisory Given   Pulmonary asthma , former smoker,    Pulmonary exam normal breath sounds clear to auscultation       Cardiovascular hypertension, Pt. on medications and Pt. on home beta blockers + Peripheral Vascular Disease  + Valvular Problems/Murmurs AS  Rhythm:Regular Rate:Normal + Systolic murmurs    Neuro/Psych Anxiety negative neurological ROS     GI/Hepatic Neg liver ROS, GERD  ,  Endo/Other  diabetes, Insulin Dependent  Renal/GU Renal InsufficiencyRenal disease  negative genitourinary   Musculoskeletal   Abdominal   Peds  Hematology negative hematology ROS (+)   Anesthesia Other Findings   Reproductive/Obstetrics negative OB ROS                           Anesthesia Physical Anesthesia Plan  ASA: 3  Anesthesia Plan: General   Post-op Pain Management: Tylenol PO (pre-op)*   Induction: Intravenous  PONV Risk Score and Plan: 3 and Ondansetron, Dexamethasone and Treatment may vary due to age or medical condition  Airway Management Planned: Nasal ETT and Video Laryngoscope Planned  Additional Equipment: Arterial line  Intra-op Plan:   Post-operative Plan: Extubation in OR  Informed Consent: I have reviewed the patients History and Physical, chart, labs and discussed the procedure including the risks, benefits and alternatives for the proposed anesthesia with the patient or authorized representative who has indicated his/her understanding and acceptance.     Dental advisory given  Plan Discussed with: CRNA  Anesthesia  Plan Comments: (PAT note written 05/13/2022 by Myra Gianotti, PA-C. )       Anesthesia Quick Evaluation

## 2022-05-14 ENCOUNTER — Encounter (HOSPITAL_COMMUNITY): Payer: Self-pay | Admitting: Dentistry

## 2022-05-14 ENCOUNTER — Ambulatory Visit (HOSPITAL_BASED_OUTPATIENT_CLINIC_OR_DEPARTMENT_OTHER): Payer: Medicare Other | Admitting: Vascular Surgery

## 2022-05-14 ENCOUNTER — Other Ambulatory Visit: Payer: Self-pay

## 2022-05-14 ENCOUNTER — Ambulatory Visit (HOSPITAL_COMMUNITY): Payer: Medicare Other | Admitting: Vascular Surgery

## 2022-05-14 ENCOUNTER — Ambulatory Visit (HOSPITAL_COMMUNITY)
Admission: RE | Admit: 2022-05-14 | Discharge: 2022-05-14 | Disposition: A | Discharge status: Home / Self Care | Payer: Medicare Other | Attending: Dentistry | Admitting: Dentistry

## 2022-05-14 ENCOUNTER — Encounter (HOSPITAL_COMMUNITY)
Admission: RE | Disposition: A | Discharge status: Home / Self Care | Payer: Self-pay | Source: Home / Self Care | Attending: Dentistry

## 2022-05-14 DIAGNOSIS — I1310 Hypertensive heart and chronic kidney disease without heart failure, with stage 1 through stage 4 chronic kidney disease, or unspecified chronic kidney disease: Secondary | ICD-10-CM | POA: Insufficient documentation

## 2022-05-14 DIAGNOSIS — E785 Hyperlipidemia, unspecified: Secondary | ICD-10-CM | POA: Insufficient documentation

## 2022-05-14 DIAGNOSIS — N183 Chronic kidney disease, stage 3 unspecified: Secondary | ICD-10-CM | POA: Diagnosis not present

## 2022-05-14 DIAGNOSIS — K045 Chronic apical periodontitis: Secondary | ICD-10-CM

## 2022-05-14 DIAGNOSIS — E1151 Type 2 diabetes mellitus with diabetic peripheral angiopathy without gangrene: Secondary | ICD-10-CM

## 2022-05-14 DIAGNOSIS — K029 Dental caries, unspecified: Secondary | ICD-10-CM

## 2022-05-14 DIAGNOSIS — Z87891 Personal history of nicotine dependence: Secondary | ICD-10-CM | POA: Diagnosis not present

## 2022-05-14 DIAGNOSIS — K0530 Chronic periodontitis, unspecified: Secondary | ICD-10-CM

## 2022-05-14 DIAGNOSIS — K083 Retained dental root: Secondary | ICD-10-CM

## 2022-05-14 DIAGNOSIS — Z79899 Other long term (current) drug therapy: Secondary | ICD-10-CM | POA: Diagnosis not present

## 2022-05-14 DIAGNOSIS — I10 Essential (primary) hypertension: Secondary | ICD-10-CM

## 2022-05-14 DIAGNOSIS — Z794 Long term (current) use of insulin: Secondary | ICD-10-CM | POA: Diagnosis not present

## 2022-05-14 DIAGNOSIS — E1122 Type 2 diabetes mellitus with diabetic chronic kidney disease: Secondary | ICD-10-CM | POA: Diagnosis not present

## 2022-05-14 DIAGNOSIS — I083 Combined rheumatic disorders of mitral, aortic and tricuspid valves: Secondary | ICD-10-CM | POA: Insufficient documentation

## 2022-05-14 HISTORY — DX: Nonrheumatic aortic (valve) stenosis: I35.0

## 2022-05-14 HISTORY — PX: TOOTH EXTRACTION: SHX859

## 2022-05-14 HISTORY — PX: ALVEOLOPLASTY: SHX5710

## 2022-05-14 LAB — GLUCOSE, CAPILLARY
Glucose-Capillary: 121 mg/dL — ABNORMAL HIGH (ref 70–99)
Glucose-Capillary: 116 mg/dL — ABNORMAL HIGH (ref 70–99)
Glucose-Capillary: 150 mg/dL — ABNORMAL HIGH (ref 70–99)

## 2022-05-14 SURGERY — DENTAL RESTORATION/EXTRACTIONS
Anesthesia: General | Site: Mouth

## 2022-05-14 MED ORDER — ORAL CARE MOUTH RINSE: 15.0000 mL | Freq: Once | OROMUCOSAL | Status: AC

## 2022-05-14 MED ORDER — ROCURONIUM BROMIDE 10 MG/ML (PF) SYRINGE
PREFILLED_SYRINGE | INTRAVENOUS | Status: AC
  Filled 2022-05-14: qty 10

## 2022-05-14 MED ORDER — LIDOCAINE 2% (20 MG/ML) 5 ML SYRINGE
INTRAMUSCULAR | Status: DC | PRN
  Administered 2022-05-14: 60 mg via INTRAVENOUS

## 2022-05-14 MED ORDER — PHENYLEPHRINE HCL-NACL 20-0.9 MG/250ML-% IV SOLN
INTRAVENOUS | Status: DC | PRN
  Administered 2022-05-14: 25 ug/min via INTRAVENOUS

## 2022-05-14 MED ORDER — PROPOFOL 10 MG/ML IV BOLUS
INTRAVENOUS | Status: DC | PRN
  Administered 2022-05-14: 80 mg via INTRAVENOUS

## 2022-05-14 MED ORDER — INSULIN ASPART 100 UNIT/ML IJ SOLN: 0.0000 [IU] | INTRAMUSCULAR | Status: DC | PRN

## 2022-05-14 MED ORDER — OXYMETAZOLINE HCL 0.05 % NA SOLN
NASAL | Status: DC | PRN
  Administered 2022-05-14: 2 via NASAL

## 2022-05-14 MED ORDER — DEXAMETHASONE SODIUM PHOSPHATE 10 MG/ML IJ SOLN
INTRAMUSCULAR | Status: AC
  Filled 2022-05-14: qty 1

## 2022-05-14 MED ORDER — LIDOCAINE-EPINEPHRINE 2 %-1:100000 IJ SOLN
INTRAMUSCULAR | Status: AC
  Filled 2022-05-14: qty 10.2

## 2022-05-14 MED ORDER — CHLORHEXIDINE GLUCONATE 0.12 % MT SOLN
OROMUCOSAL | Status: AC
  Filled 2022-05-14: qty 15

## 2022-05-14 MED ORDER — SUGAMMADEX SODIUM 200 MG/2ML IV SOLN
INTRAVENOUS | Status: DC | PRN
  Administered 2022-05-14: 200 mg via INTRAVENOUS

## 2022-05-14 MED ORDER — EPHEDRINE SULFATE-NACL 50-0.9 MG/10ML-% IV SOSY
PREFILLED_SYRINGE | INTRAVENOUS | Status: DC | PRN
  Administered 2022-05-14: 15 mg via INTRAVENOUS
  Administered 2022-05-14: 10 mg via INTRAVENOUS

## 2022-05-14 MED ORDER — FENTANYL CITRATE (PF) 100 MCG/2ML IJ SOLN
INTRAMUSCULAR | Status: DC | PRN
  Administered 2022-05-14: 100 ug via INTRAVENOUS

## 2022-05-14 MED ORDER — PHENYLEPHRINE 80 MCG/ML (10ML) SYRINGE FOR IV PUSH (FOR BLOOD PRESSURE SUPPORT)
PREFILLED_SYRINGE | INTRAVENOUS | Status: AC
  Filled 2022-05-14: qty 10

## 2022-05-14 MED ORDER — ONDANSETRON HCL 4 MG/2ML IJ SOLN
INTRAMUSCULAR | Status: DC | PRN
  Administered 2022-05-14: 4 mg via INTRAVENOUS

## 2022-05-14 MED ORDER — ACETAMINOPHEN 500 MG PO TABS
1000.0000 mg | ORAL_TABLET | Freq: Once | ORAL | Status: AC
  Administered 2022-05-14: 1000 mg via ORAL
  Filled 2022-05-14: qty 2

## 2022-05-14 MED ORDER — PROPOFOL 10 MG/ML IV BOLUS
INTRAVENOUS | Status: AC
  Filled 2022-05-14: qty 20

## 2022-05-14 MED ORDER — BUPIVACAINE-EPINEPHRINE (PF) 0.5% -1:200000 IJ SOLN
INTRAMUSCULAR | Status: AC
  Filled 2022-05-14: qty 3.6

## 2022-05-14 MED ORDER — BUPIVACAINE-EPINEPHRINE 0.5% -1:200000 IJ SOLN
INTRAMUSCULAR | Status: DC | PRN
  Administered 2022-05-14: 1.8 mL

## 2022-05-14 MED ORDER — GLYCOPYRROLATE PF 0.2 MG/ML IJ SOSY
PREFILLED_SYRINGE | INTRAMUSCULAR | Status: DC | PRN
  Administered 2022-05-14: .2 mg via INTRAVENOUS

## 2022-05-14 MED ORDER — OXYMETAZOLINE HCL 0.05 % NA SOLN
NASAL | Status: AC
  Filled 2022-05-14: qty 30

## 2022-05-14 MED ORDER — CHLORHEXIDINE GLUCONATE 0.12 % MT SOLN
15.0000 mL | Freq: Once | OROMUCOSAL | Status: AC
  Administered 2022-05-14: 15 mL via OROMUCOSAL

## 2022-05-14 MED ORDER — EPHEDRINE 5 MG/ML INJ
INTRAVENOUS | Status: AC
  Filled 2022-05-14: qty 5

## 2022-05-14 MED ORDER — ONDANSETRON HCL 4 MG/2ML IJ SOLN
INTRAMUSCULAR | Status: AC
  Filled 2022-05-14: qty 2

## 2022-05-14 MED ORDER — FENTANYL CITRATE (PF) 250 MCG/5ML IJ SOLN
INTRAMUSCULAR | Status: AC
  Filled 2022-05-14: qty 5

## 2022-05-14 MED ORDER — LIDOCAINE 2% (20 MG/ML) 5 ML SYRINGE
INTRAMUSCULAR | Status: AC
  Filled 2022-05-14: qty 10

## 2022-05-14 MED ORDER — GLYCOPYRROLATE PF 0.2 MG/ML IJ SOSY
PREFILLED_SYRINGE | INTRAMUSCULAR | Status: AC
  Filled 2022-05-14: qty 1

## 2022-05-14 MED ORDER — HYDROCODONE-ACETAMINOPHEN 5-325 MG PO TABS: 1.0000 | ORAL_TABLET | Freq: Four times a day (QID) | ORAL | 0 refills | Status: AC | PRN

## 2022-05-14 MED ORDER — HEMOSTATIC AGENTS (NO CHARGE) OPTIME
TOPICAL | Status: DC | PRN
  Administered 2022-05-14: 1 via TOPICAL

## 2022-05-14 MED ORDER — CEFAZOLIN SODIUM-DEXTROSE 2-4 GM/100ML-% IV SOLN
2.0000 g | INTRAVENOUS | Status: AC
  Administered 2022-05-14: 2 g via INTRAVENOUS
  Filled 2022-05-14: qty 100

## 2022-05-14 MED ORDER — AMOXICILLIN 500 MG PO CAPS: ORAL_CAPSULE | ORAL | 0 refills | Status: AC

## 2022-05-14 MED ORDER — LACTATED RINGERS IV SOLN: INTRAVENOUS | Status: DC

## 2022-05-14 MED ORDER — STERILE WATER FOR IRRIGATION IR SOLN
Status: DC | PRN
  Administered 2022-05-14: 1000 mL

## 2022-05-14 MED ORDER — DEXAMETHASONE SODIUM PHOSPHATE 10 MG/ML IJ SOLN
INTRAMUSCULAR | Status: DC | PRN
  Administered 2022-05-14: 8 mg via INTRAVENOUS

## 2022-05-14 MED ORDER — LIDOCAINE-EPINEPHRINE 2 %-1:100000 IJ SOLN
INTRAMUSCULAR | Status: DC | PRN
  Administered 2022-05-14: 3.4 mL

## 2022-05-14 MED ORDER — 0.9 % SODIUM CHLORIDE (POUR BTL) OPTIME
TOPICAL | Status: DC | PRN
  Administered 2022-05-14: 1000 mL

## 2022-05-14 MED ORDER — ROCURONIUM BROMIDE 10 MG/ML (PF) SYRINGE
PREFILLED_SYRINGE | INTRAVENOUS | Status: DC | PRN
  Administered 2022-05-14: 10 mg via INTRAVENOUS
  Administered 2022-05-14: 50 mg via INTRAVENOUS

## 2022-05-14 SURGICAL SUPPLY — 37 items
ALCOHOL 70% 16 OZ (MISCELLANEOUS) ×2 IMPLANT
BAG COUNTER SPONGE SURGICOUNT (BAG) ×2 IMPLANT
BAG SPNG CNTER NS LX DISP (BAG) ×1
BLADE SURG 15 STRL LF DISP TIS (BLADE) ×1 IMPLANT
BLADE SURG 15 STRL SS (BLADE) ×2
COVER SURGICAL LIGHT HANDLE (MISCELLANEOUS) ×2 IMPLANT
GAUZE 4X4 16PLY ~~LOC~~+RFID DBL (SPONGE) ×2 IMPLANT
GAUZE PACKING FOLDED 2  STR (GAUZE/BANDAGES/DRESSINGS) ×2
GAUZE PACKING FOLDED 2 STR (GAUZE/BANDAGES/DRESSINGS) ×1 IMPLANT
GLOVE SURG ENC MOIS LTX SZ6.5 (GLOVE) ×2 IMPLANT
GLOVE SURG POLYISO LF SZ6 (GLOVE) ×2 IMPLANT
GOWN STRL REUS W/ TWL LRG LVL3 (GOWN DISPOSABLE) ×2 IMPLANT
GOWN STRL REUS W/TWL LRG LVL3 (GOWN DISPOSABLE) ×4
KIT BASIN OR (CUSTOM PROCEDURE TRAY) ×2 IMPLANT
KIT TURNOVER KIT B (KITS) ×2 IMPLANT
MANIFOLD NEPTUNE II (INSTRUMENTS) ×1 IMPLANT
NDL BLUNT 16X1.5 OR ONLY (NEEDLE) ×1 IMPLANT
NDL DENTAL 27 LONG (NEEDLE) ×2 IMPLANT
NEEDLE BLUNT 16X1.5 OR ONLY (NEEDLE) ×2 IMPLANT
NEEDLE DENTAL 27 LONG (NEEDLE) ×4 IMPLANT
NS IRRIG 1000ML POUR BTL (IV SOLUTION) ×2 IMPLANT
PACK EENT II TURBAN DRAPE (CUSTOM PROCEDURE TRAY) ×2 IMPLANT
PAD ARMBOARD 7.5X6 YLW CONV (MISCELLANEOUS) ×2 IMPLANT
SPONGE SURGIFOAM ABS GEL 100 (HEMOSTASIS) IMPLANT
SPONGE SURGIFOAM ABS GEL 12-7 (HEMOSTASIS) IMPLANT
SPONGE SURGIFOAM ABS GEL SZ50 (HEMOSTASIS) ×1 IMPLANT
SUCTION FRAZIER HANDLE 10FR (MISCELLANEOUS)
SUCTION TUBE FRAZIER 10FR DISP (MISCELLANEOUS) ×1 IMPLANT
SUT CHROMIC 3 0 PS 2 (SUTURE) ×3 IMPLANT
SUT CHROMIC 4 0 P 3 18 (SUTURE) IMPLANT
SYR 50ML SLIP (SYRINGE) ×2 IMPLANT
SYR BULB IRRIG 60ML STRL (SYRINGE) ×2 IMPLANT
TOWEL GREEN STERILE FF (TOWEL DISPOSABLE) ×2 IMPLANT
TUBE CONNECTING 12X1/4 (SUCTIONS) ×2 IMPLANT
WATER STERILE IRR 1000ML POUR (IV SOLUTION) ×2 IMPLANT
WATER TABLETS ICX (MISCELLANEOUS) ×2 IMPLANT
YANKAUER SUCT BULB TIP NO VENT (SUCTIONS) ×2 IMPLANT

## 2022-05-14 NOTE — Anesthesia Postprocedure Evaluation (Signed)
Anesthesia Post Note  Patient: Ryan Duke  Procedure(s) Performed: DENTAL EXTRACTIONS TEETH NUMBER TWO, THREE, FOUR, FIVE, FOURTEEN, FIFTEEN, TWENTY (Mouth) ALVEOLOPLASTY (Mouth)     Patient location during evaluation: PACU Anesthesia Type: General Level of consciousness: awake and alert Pain management: pain level controlled Vital Signs Assessment: post-procedure vital signs reviewed and stable Respiratory status: spontaneous breathing, nonlabored ventilation and respiratory function stable Cardiovascular status: blood pressure returned to baseline and stable Postop Assessment: no apparent nausea or vomiting Anesthetic complications: no   No notable events documented.  Last Vitals:  Vitals:   05/14/22 1409 05/14/22 1424  BP: 130/65 111/61  Pulse:  71  Resp: 17 (!) 21  Temp:    SpO2:  95%    Last Pain:  Vitals:   05/14/22 1424  TempSrc:   PainSc: 0-No pain                 Khoi Hamberger,W. EDMOND

## 2022-05-14 NOTE — Op Note (Signed)
Department of Dental Medicine        OPERATIVE REPORT  DATE OF SURGERY:   05/14/2022  PATIENT'S NAME:   Ryan Duke DATE OF BIRTH:   [birth year] MEDICAL RECORD NUMBER: [id number]  SURGEON:   Parker Wherley B. Benson Norway, D.M.D.  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   Accretions on teeth 05/11/2022   Impacted third molar tooth 05/11/2022   Defective dental restoration 05/11/2022   Periapical abscess without sinus 05/11/2022   Severe aortic stenosis    Onychomycosis of toenail 12/04/2019   OAB (overactive bladder) 06/08/2019   Vitamin D deficiency 02/15/2018   Bilateral low back pain with sciatica 11/01/2017   Mild intermittent asthma without complication 97/35/3299   Primary gout 07/07/2016   Routine general medical examination at a health care facility 09/18/2011   GERD 07/22/2010   BPH (benign prostatic hyperplasia) 09/02/2009   Kidney disease, chronic, 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) ([place]) 02/26/2009   Aortic valve disorder 02/25/2009   PERIPHERAL VASCULAR DISEASE 02/25/2009   Type II diabetes mellitus with manifestations ([place]) 11/01/2007   Hyperlipidemia with target LDL less than 100 07/01/2007   Morbid obesity ([place]) 07/01/2007   Essential hypertension 07/01/2007    POSTOPERATIVE DIAGNOSES:  Dental caries, chronic periodontitis   PROCEDURES PERFORMED: Extractions of teeth numbers 2, 3, 4, 5, 14, 15 and 20 Quadrants of alveoloplasty (upper right)  ANESTHESIA:  General anesthesia via nasal endotracheal tube.  MEDICATIONS: Ancef 2 g IV prior to invasive dental procedures. Local anesthesia with a  total utilization of 2 cartridges of 34 mg of lidocaine with 0.018 mg of epinephrine/ea as well as 1 cartridge with 9 mg of bupivacaine with 0.009 mg of epinephrine/ea.  SPECIMENS:  7 teeth that were extracted and discarded  DRAINS/CULTURES:  None  COMPLICATIONS:  None  ESTIMATED BLOOD LOSS:  5 mL  INTRAVENOUS FLUIDS:  10 mL of Lactated ringers solution  INDICATIONS:  The patient was recently diagnosed with severe aortic stenosis.  A medically necessary dental consult was then requested to evaluate the patient for any dental/orofacial infection and their overall oral health.  The patient was examined and subsequently treatment planned for multiple extractions of grossly decayed and infected teeth.  This treatment plan was made to decrease the perioperative and postoperative risks and complications associated with dental/orofacial infection from affecting the patient's systemic health.  OPERATIVE FINDINGS:  The patient was examined in operating room number 8.  The indicated teeth were identified and verified for extraction. The patient was noted be affected by severe dental decay, retained tooth roots, chronic periodontitis and chronic apical periodontitis.  DESCRIPTION OF PROCEDURE:  The patient was identified in the holding area and brought to the main operating room number 8 by the anesthesia team. The patient was then placed in the supine position on the operating table.  General anesthesia was then induced per the anesthesia team. The patient was then prepped and draped in the usual sterile fashion for dental medicine procedures.  A timeout was performed. The patient was identified and procedures were verified. A throat pack was placed at this time. The oral cavity was then  thoroughly examined with the findings noted above. The patient was then ready for the dental medicine procedure as follows:   ANESTHESIA: Local anesthesia was administered sequentially with a total utilization of 2  cartridges each containing 34 mg of lidocaine with 0.018 mg of epinephrine as well as 1 cartridge each containing 9 mg bupivacaine with 0.009 mg of epinephrine.  Location of anesthesia included: upper right and left- infiltration/palatal; lower left- #20 infiltration, mental nerve block and lingual.  ROUTINE EXTRACTIONS: The maxillary left and right quadrants were first approached. The teeth were then subluxated with a series of straight elevators.  Teeth numbers 2, 3, 14 and 15 were then removed with a 150 forceps and rongeurs without complications.  Alveoloplasty was then performed utilizing a ronguers and bone file in the upper right quadrant.  The tissues were approximated and trimmed appropriately to help achieve primary closure.  The surgical sites were then irrigated with copious amounts of sterile saline.  Surgi Foam was placed in each extraction site on the upper right.   The surgical sites were closed using 3-0 chromic gut sutures as follows:  upper right- 3 simple interrupted, 1 continuous interlocking; upper left- 2 simple interrupted.  SURGICAL EXTRACTIONS: Routine forceps extraction was attempted on teeth numbers 4, 5 and 20 as described above.  Teeth numbers 4, 5 and 20 broke off to the gingival margin.  A 15 blade incision was then made from the distal of tooth #4 to the distal of tooth #6.  A FTMP flap was then carefully reflected with the periosteal elevator.  A surgical handpiece was used with copious amounts of sterile irrigation to remove buccal, mesial, distal and interproximal bone.  Teeth numbers 4, 5 and 20 as well as all remaining root tips were extracted using east/west elevators and Rongeurs without complications.  Alveoloplasty was performed utilizing a rongeurs and bone file in the upper right quadrant.  The tissues were approximated and trimmed appropriately to help achieve primary closure.  The surgical sites were then irrigated with copious amounts of sterile saline.  Surgi  Foam was placed in extraction sites #4 and #5.  The surgical sites were closed using 3-0 chromic gut sutures as follows: continuous interlocking (as described above)   END OF PROCEDURE: Thorough oral irrigation with sterile saline was performed.  Good hemostasis was observed.  The patient was examined for complications, and seeing none, the dental medicine procedure was deemed to be complete.  The throat pack was removed at this time. A series of 4x4 gauze were placed in the mouth to aid hemostasis as needed.  The patient was then handed over to the anesthesia team for final disposition.  After an appropriate amount of time, the patient was extubated and taken to the postanesthsia care unit in stable condition.  All counts were correct for the dental medicine procedure.   Charlaine Dalton, D.M.D.

## 2022-05-14 NOTE — Anesthesia Procedure Notes (Signed)
Procedure Name: Intubation Date/Time: 05/14/2022 12:07 PM  Performed by: Barrington Ellison, CRNAPre-anesthesia Checklist: Patient identified, Emergency Drugs available, Suction available and Patient being monitored Patient Re-evaluated:Patient Re-evaluated prior to induction Oxygen Delivery Method: Circle System Utilized Preoxygenation: Pre-oxygenation with 100% oxygen Induction Type: IV induction Ventilation: Mask ventilation without difficulty Laryngoscope Size: Glidescope and 4 Grade View: Grade I Nasal Tubes: Nasal Rae, Nasal prep performed and Right Tube size: 7.0 mm Number of attempts: 2 Placement Confirmation: ETT inserted through vocal cords under direct vision, positive ETCO2 and breath sounds checked- equal and bilateral Secured at: 27 cm Tube secured with: Tape Dental Injury: Teeth and Oropharynx as per pre-operative assessment

## 2022-05-14 NOTE — Interval H&P Note (Signed)
History and Physical Interval Note:  05/14/2022  Ryan Duke  has presented today for surgery, with the diagnosis of dental caries.  The various methods of treatment have been discussed with the patient and family. After consideration of risks, benefits and other options for treatment, the patient has consented to the procedure(s): MULTIPLE EXTRACTIONS WITH ALVEOLOPLASTY as a surgical intervention.  The patient's history has been reviewed, patient examined, no change in status, stable for surgery.  I have reviewed the patient's chart and labs.  Questions were answered to the patient's satisfaction.       [place] Benson [place], D.M.D.

## 2022-05-14 NOTE — Progress Notes (Signed)
Notified Dr. Therisa Doyne that pt has [employer] glucose monitor on his left arm. Per Dr. Ola Spurr , glucose monitor should be removed. Monitor removed in short stay.

## 2022-05-14 NOTE — Discharge Instructions (Signed)
[facility] [facility] DEPARTMENT OF DENTAL MEDICINE Dr. Debe Coder B. Benson Norway, D.M.D. Phone: [phone] Fax: [phone]    MOUTH CARE AFTER SURGERY     FACTS: Ice used in ice bag helps keep the swelling down, and can help lessen the pain for the first 24 hours after surgery. It is easier to treat pain BEFORE it happens. Spitting disturbs the clot and may cause bleeding to start again, or to get worse. Smoking delays healing and can cause complications. Sharing prescriptions can be dangerous.  Do not take medications not recently prescribed for you. Antibiotics may stop birth control pills from working.  Use other means of birth control while on antibiotics. Warm salt water rinses after the first 24 hours will help lessen the swelling:  Use 1/2 teaspoonful of table salt per oz.of water.    DO NOT: Spit Drink through a straw It is strongly advised not to smoke, dip snuff or chew tobacco for at least 3 days. Eat sharp or crunchy foods.  Avoid the area of surgery when chewing. Stop your antibiotics before your instructions say to do so. Eat hot foods until bleeding has stopped.  If you need to, let your food cool down to room temperature.      WHAT TO EXPECT: Some swelling, especially during the first 2-3 days. Soreness or discomfort in varying degrees.  Follow your dentist's instructions about how to handle pain before it starts. Pinkish saliva or light blood in saliva, or on your pillow in the morning.  This can last around 24 hours. Bruising inside or outside the mouth.  This may not show up until 2-3 days after surgery.  Don't worry, it will go away in time. Pieces of "bone" may work themselves loose.  It's OK.  If they bother you, let us know.     WHAT TO DO IMMEDIATELY AFTER SURGERY: Bite on gauze with steady pressure for 30-45 minutes at a time.  Switch out the gauze after 30-45 minutes for clean gauze, and continue this for 1-2 hours or until  bleeding subsides. Do not chew on the gauze. Do not lie down flat.  Raise your head support especially for the first 24 hours. Apply ice to your face on the side of the surgery.  You may apply it 20 minutes on and a few minutes off.  Ice for 8-12 hours.  You may use ice up to 24 hours. Before the numbness wears off, take a pain pill as instructed. Prescription pain medication is not always required.     SWELLING: Expect swelling for the first couple of days.  It should get better after that. If swelling increases 3 days or so after surgery, let us know as soon as possible.    FEVER: Take Tylenol every 4 hours if needed to lower your temperature, especially if it is at 100oF or higher. Drink lots of fluids. If the fever does not go away, let us know.    BREATHING: Any unusual difficulty breathing means you have to have someone bring you to the emergency room ASAP.    BLEEDING: Light oozing is expected for 24 hours or so. Prop head up with pillows. Do not spit. Do not confuse bright red fresh flowing blood with lots of saliva colored with a little bit of blood. If you notice some bleeding, place gauze or a tea bag where it is bleeding and apply CONSTANT pressure by biting down for 1 hour.  Avoid talking during this time.  Do not remove the gauze or tea bag during this hour to "check" the bleeding. If you notice bright RED bleeding FLOWING out of particular area, and filling the floor of your mouth, put a wad of gauze on that area, bite down firmly and constantly.  Call us immediately.  If we're closed, have someone bring you to the emergency room.     ORAL HYGIENE: Brush your teeth as usual after meals and before bedtime. Use a soft toothbrush around the area of surgery. DO NOT AVOID BRUSHING.  Otherwise bacteria(germs) will grow and may delay healing or encourage infection. Since you cannot spit, just gently rinse and let the water flow out of your mouth. DO NOT SWISH HARD.      EATING: Cool liquids are a good point to start.  Increase to soft foods as tolerated.     PRESCRIPTIONS: Follow the directions for your prescriptions exactly as written. If your doctor gave you a narcotic pain medication, do not drive, operate machinery or drink alcohol when on that medication.    SINUS PRECAUTIONS AFTER ORAL SURGERY   AVOID Blowing your nose:  It is best to wipe away nasal secretions carefully. After 2 weeks, if you must blow your nose, blow gently through both sides at the same time. Do not pinch your nose; do not blow just one side at a time. Sneezing:  If you must sneeze, keep your mouth open and do not pinch your nose closed. Sucking:  Do not drink through a straw. Do not smoke.  Blowing:  Do not play a wind instrument. Do not blow a balloons. Pushing or lifting: Do not lift or push objects weighing more than 20 pounds. Bending over: Keeping her head above the level of your heart. Sleep with your head slightly raised.   NOTIFY YOUR DENTIST IF: You bleed from your nose.  If you see bleeding from your nose, have neck stiffness, or increased sensitivity to bright light or severe headache, call Dental Medicine or proceed to the Emergency Department for evaluation immediately. You are unable to take any of your medications as prescribed.  You may be advised to take an antibiotic and decongestant as well as your regular pain medication. You must take these medications as prescribed. Do not stop taking them on her own. If you have a problem with any medication, please call us so that we can make an adjustment for you.    Questions?  Call our office during office hours at [phone] or call the Emergency Room at [phone].

## 2022-05-14 NOTE — Transfer of Care (Signed)
Immediate Anesthesia Transfer of Care Note  Patient: Ryan Duke  Procedure(s) Performed: DENTAL EXTRACTIONS TEETH NUMBER TWO, THREE, FOUR, FIVE, FOURTEEN, FIFTEEN, TWENTY (Mouth) ALVEOLOPLASTY (Mouth)  Patient Location: PACU  Anesthesia Type:General  Level of Consciousness: lethargic and responds to stimulation  Airway & Oxygen Therapy: Patient Spontanous Breathing and Patient connected to face mask oxygen  Post-op Assessment: Report given to RN  Post vital signs: Reviewed and stable  Last Vitals:  Vitals Value Taken Time  BP 127/58 05/14/22 1354  Temp    Pulse 66 05/14/22 1354  Resp 19 05/14/22 1354  SpO2 93 % 05/14/22 1354  Vitals shown include unvalidated device data.  Last Pain:  Vitals:   05/14/22 0921  TempSrc:   PainSc: 0-No pain      Patients Stated Pain Goal: 3 (22/48/25 0037)  Complications: No notable events documented.

## 2022-05-14 NOTE — Anesthesia Procedure Notes (Signed)
Arterial Line Insertion Start/End6/15/2023 10:20 AM, 05/14/2022 10:25 AM  Patient location: Pre-op. Preanesthetic checklist: patient identified, IV checked, site marked, risks and benefits discussed, surgical consent, monitors and equipment checked, pre-op evaluation, timeout performed and anesthesia consent Lidocaine 1% used for infiltration Left, radial was placed Catheter size: 20 G Hand hygiene performed  and maximum sterile barriers used   Attempts: 1 Procedure performed without using ultrasound guided technique. Following insertion, dressing applied and Biopatch. Post procedure assessment: normal and unchanged  Patient tolerated the procedure well with no immediate complications.

## 2022-05-15 ENCOUNTER — Encounter (HOSPITAL_COMMUNITY): Payer: Self-pay | Admitting: Dentistry

## 2022-05-18 ENCOUNTER — Ambulatory Visit (HOSPITAL_COMMUNITY)
Admission: RE | Admit: 2022-05-18 | Discharge: 2022-05-18 | Disposition: A | Discharge status: Home / Self Care | Payer: Medicare Other | Source: Ambulatory Visit | Attending: Cardiovascular Disease | Admitting: Cardiovascular Disease

## 2022-05-18 ENCOUNTER — Telehealth (HOSPITAL_COMMUNITY): Payer: Self-pay

## 2022-05-18 DIAGNOSIS — I10 Essential (primary) hypertension: Secondary | ICD-10-CM | POA: Diagnosis not present

## 2022-05-18 DIAGNOSIS — I350 Nonrheumatic aortic (valve) stenosis: Secondary | ICD-10-CM | POA: Diagnosis not present

## 2022-05-18 DIAGNOSIS — I700 Atherosclerosis of aorta: Secondary | ICD-10-CM | POA: Diagnosis not present

## 2022-05-18 DIAGNOSIS — Z01818 Encounter for other preprocedural examination: Secondary | ICD-10-CM | POA: Diagnosis not present

## 2022-05-18 LAB — BASIC METABOLIC PANEL
Anion gap: 9 (ref 5–15)
BUN: 31 mg/dL — ABNORMAL HIGH (ref 8–23)
CO2: 27 mmol/L (ref 22–32)
Calcium: 9.6 mg/dL (ref 8.9–10.3)
Chloride: 103 mmol/L (ref 98–111)
Creatinine, Ser: 1.51 mg/dL — ABNORMAL HIGH (ref 0.61–1.24)
GFR, Estimated: 46 mL/min — ABNORMAL LOW (ref >60)
Glucose, Bld: 106 mg/dL — ABNORMAL HIGH (ref 70–99)
Potassium: 3.6 mmol/L (ref 3.5–5.1)
Sodium: 139 mmol/L (ref 135–145)

## 2022-05-18 MED ORDER — SODIUM CHLORIDE 0.9 % WEIGHT BASED INFUSION
3.0000 mL/kg/h | INTRAVENOUS | Status: DC
  Administered 2022-05-18: 3 mL/kg/h via INTRAVENOUS

## 2022-05-18 MED ORDER — IOHEXOL 350 MG/ML SOLN
100.0000 mL | Freq: Once | INTRAVENOUS | Status: AC | PRN
  Administered 2022-05-18: 100 mL via INTRAVENOUS

## 2022-05-18 MED ORDER — SODIUM CHLORIDE 0.9 % WEIGHT BASED INFUSION: 1.0000 mL/kg/h | INTRAVENOUS | Status: DC

## 2022-05-20 ENCOUNTER — Encounter (HOSPITAL_COMMUNITY): Payer: Self-pay | Admitting: Dentistry

## 2022-05-20 ENCOUNTER — Ambulatory Visit (INDEPENDENT_AMBULATORY_CARE_PROVIDER_SITE_OTHER): Payer: Self-pay | Admitting: Dentistry

## 2022-05-20 VITALS — BP 156/62 | HR 64 | Temp 98.6°F

## 2022-05-20 DIAGNOSIS — K08199 Complete loss of teeth due to other specified cause, unspecified class: Secondary | ICD-10-CM | POA: Insufficient documentation

## 2022-05-20 MED ORDER — CHLORHEXIDINE GLUCONATE 0.12 % MT SOLN: 15.0000 mL | Freq: Two times a day (BID) | OROMUCOSAL | 0 refills | Status: AC

## 2022-05-20 MED ORDER — HYDROCODONE-ACETAMINOPHEN 5-325 MG PO TABS: 1.0000 | ORAL_TABLET | Freq: Four times a day (QID) | ORAL | 0 refills | Status: DC | PRN

## 2022-05-20 MED ORDER — HYDROCODONE-ACETAMINOPHEN 5-325 MG PO TABS: 1.0000 | ORAL_TABLET | Freq: Four times a day (QID) | ORAL | 0 refills | Status: AC | PRN

## 2022-05-20 NOTE — Progress Notes (Signed)
Department of Dental Medicine   Service Date:   05/20/2022  Patient Name:  Ryan Duke Date of Birth:   1941/07/13 Medical Record Number: [id number]  TODAY'S VISIT: POSTOPERATIVE APPOINTMENT   ASSESSMENT: The patient continues to heal well and consistent with dental procedures performed.  RECOMMENDATIONS: Try alternating Ibuprofen 600 mg & Tylenol 500 mg every 4-6 hours for up to 3 days as needed for pain.  If you take prescription pain medication, do not take any additional Acetaminophen (Tylenol). Use prescription mouthrinse 2-3 times a day in a swish & spit manner until your next dental visit. Avoid chewing/eating on the right side & continue soft food diet as needed. Finish your antibiotic course as prescribed/instructed.  PLAN: Follow-up in 1 week. Call if any questions or concerns arise before next visit.   05/20/2022   HISTORY OF PRESENT ILLNESS: Ryan Duke presents today for a postoperative visit status-post multiple extractions in the operating room on 05/14/2022.   Medical history reviewed with the patient; no changes were reported.   CHIEF COMPLAINT:   The patient reports ongoing intermittent pain/discomfort on the top right side since dental procedure last week. He says that it is painful when he tries to eat and will occasionally start to hurt when he is not eating.   Patient Active Problem List   Diagnosis Date Noted   Encounter for preoperative dental examination 05/11/2022   Caries 05/11/2022   Teeth missing 05/11/2022   Retained tooth root 05/11/2022   Chronic apical periodontitis 05/11/2022   Chronic periodontitis 05/11/2022   Phobia of dental procedure 05/11/2022   Accretions on teeth 05/11/2022   Impacted third molar tooth 05/11/2022   Defective dental restoration 05/11/2022   Periapical abscess without sinus 05/11/2022   Severe aortic stenosis    Onychomycosis of toenail 12/04/2019   OAB (overactive bladder) 06/08/2019   Vitamin D  deficiency 02/15/2018   Bilateral low back pain with sciatica 11/01/2017   Mild intermittent asthma without complication 62/56/3893   Primary gout 07/07/2016   Routine general medical examination at a health care facility 09/18/2011   GERD 07/22/2010   BPH (benign prostatic hyperplasia) 09/02/2009   Kidney disease, chronic, 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) (HCC) 02/26/2009   Aortic valve disorder 02/25/2009   PERIPHERAL VASCULAR DISEASE 02/25/2009   Type II diabetes mellitus with manifestations ([place]) 11/01/2007   Hyperlipidemia with target LDL less than 100 07/01/2007   Morbid obesity ([place]) 07/01/2007   Essential hypertension 07/01/2007   Past Medical History:  Diagnosis Date   Aortic stenosis    severe AS 04/14/22   Diabetes mellitus    type 2   Hyperlipidemia    Hypertension    Hypogonadism, male    Pancreatitis 11/30/2006   Renal insufficiency    Past Surgical History:  Procedure Laterality Date   ALVEOLOPLASTY N/A 05/14/2022   Procedure: ALVEOLOPLASTY;  Surgeon: Charlaine Dalton, DMD;  Location: [facility];  Service: Dentistry;  Laterality: N/A;   CARDIAC CATHETERIZATION     EYE SURGERY     cataracts   FLEXIBLE SIGMOIDOSCOPY N/A 09/10/2017   Procedure: FLEXIBLE SIGMOIDOSCOPY;  Surgeon: Mauri Pole, MD;  Location: [facility];  Service: Endoscopy;  Laterality: N/A;   IR KYPHO LUMBAR INC FX REDUCE BONE BX UNI/BIL CANNULATION INC/IMAGING  09/14/2017   IR RADIOLOGIST EVAL & MGMT  10/07/2017   RIGHT HEART CATH AND CORONARY ANGIOGRAPHY N/A 05/07/2022   Procedure: RIGHT HEART CATH AND CORONARY ANGIOGRAPHY;  Surgeon: Sherren Mocha, MD;  Location:  [facility];  Service: Cardiovascular;  Laterality: N/A;   TONSILLECTOMY     TOOTH EXTRACTION N/A 05/14/2022   Procedure: DENTAL EXTRACTIONS TEETH NUMBER TWO, THREE, FOUR, FIVE, FOURTEEN, FIFTEEN, TWENTY;  Surgeon: Charlaine Dalton, DMD;  Location: [facility];  Service: Dentistry;  Laterality: N/A;   Current Outpatient Medications   Medication Sig Dispense Refill   amoxicillin (AMOXIL) 500 MG capsule Take two capsules now, then one capsule by mouth every 8 hours until all gone. 22 capsule 0   atenolol-chlorthalidone (TENORETIC) 100-25 MG tablet TAKE ONE TABLET BY MOUTH ONCE DAILY 90 tablet 1   Continuous Blood Gluc Receiver (FREESTYLE LIBRE 2 READER) DEVI 1 Act by Does not apply route daily. 2 each 5   Continuous Blood Gluc Sensor (FREESTYLE LIBRE 2 SENSOR) MISC USE AS DIRECTED AND REPLACE EVERY 14 DAYS. 2 each 5   DULoxetine (CYMBALTA) 60 MG capsule TAKE ONE CAPSULE DAILY 90 capsule 1   glucagon (GLUCAGEN HYPOKIT) 1 MG SOLR injection Inject 1 mg into the vein once as needed for up to 1 dose for low blood sugar. (Patient not taking: Reported on 04/30/2022) 2 each 2   Insulin Glargine-Lixisenatide (SOLIQUA) 100-33 UNT-MCG/ML SOPN Inject 60 Units into the skin daily. (Patient taking differently: Inject 50 Units into the skin daily with breakfast.) 54 mL 1   Insulin Pen Needle (NOVOFINE) 32G X 6 MM MISC 1 Act by Does not apply route daily. 100 each 1   JARDIANCE 25 MG TABS tablet TAKE ONE TABLET ONCE DAILY BEFORE BREAKFAST. 90 tablet 1   KERENDIA 20 MG TABS Take 1 tablet by mouth daily. 90 tablet 0   losartan (COZAAR) 100 MG tablet Take 1 tablet (100 mg total) by mouth daily. for high blood pressure 90 tablet 1   Multiple Vitamin (MULTIVITAMIN WITH MINERALS) TABS tablet Take 1 tablet by mouth 2 (two) times a week.     Multiple Vitamins-Minerals (PRESERVISION AREDS PO) Take 1 tablet by mouth in the morning and at bedtime.     MYRBETRIQ 50 MG TB24 tablet TAKE 1 TABLET ONCE DAILY. 90 tablet 1   pravastatin (PRAVACHOL) 40 MG tablet TAKE ONE TABLET BY MOUTH ONCE DAILY. 90 tablet 1   pregabalin (LYRICA) 150 MG capsule Take 150 mg by mouth 2 (two) times daily.     tamsulosin (FLOMAX) 0.4 MG CAPS capsule TAKE ONE CAPSULE BY MOUTH DAILY 90 capsule 1   vitamin B-12 (CYANOCOBALAMIN) 500 MCG tablet Take 500 mcg by mouth daily.     No  current facility-administered medications for this visit.   Allergies  Allergen Reactions   Farxiga [Dapagliflozin] Other (See Comments)    gout   Metformin And Related Other (See Comments)    acidosis   Atorvastatin Other (See Comments)    REACTION: h/a   Benazepril Hcl Other (See Comments)    Was not effective   Flexeril [Cyclobenzaprine] Other (See Comments)    HALLUSINATIONS   Oxycodone Other (See Comments)    HALLUSINATIONS    LABS: Lab Results  Component Value Date   WBC 9.1 05/04/2022   HGB 14.3 05/07/2022   HGB 14.6 05/07/2022   HCT 42.0 05/07/2022   HCT 43.0 05/07/2022   MCV 91 05/04/2022   PLT 226 05/04/2022   BMET    Component Value Date/Time   NA 139 05/18/2022 0841   NA 140 05/04/2022 1439   K 3.6 05/18/2022 0841   CL 103 05/18/2022 0841   CO2 27 05/18/2022 0841   GLUCOSE  106 (H) 05/18/2022 0841   GLUCOSE 169 (H) 10/06/2006 0741   BUN 31 (H) 05/18/2022 0841   BUN 32 (H) 05/04/2022 1439   CREATININE 1.51 (H) 05/18/2022 0841   CALCIUM 9.6 05/18/2022 0841   EGFR 48 (L) 05/04/2022 1439   GFRNONAA 46 (L) 05/18/2022 0841    Lab Results  Component Value Date   INR 1.04 09/14/2017   No results found for: "PTT"   VITALS: BP (!) 156/62 (BP Location: Right Arm, Patient Position: Sitting, Cuff Size: Normal)   Pulse 64   Temp 98.6 F (37 C) (Oral)    EXAM: Extraction sites appear to be healing WNL.  No signs of wound dehiscence or infection evident upon examination.  No sutures remain in-tact. Soft tissue open on the upper right side with a lot of impacted food in extraction sites. Impacted 3rd molar (#1) occlusal surface visible.   ASSESSMENT:   Postoperative course is consistent with dental procedures performed.   PROCEDURES: Discussed treatment options with the patient to include anesthetizing the upper right quadrant to better irrigate/clean out extraction sites and placing sutures to help with soft tissue closure versus irrigating extraction  sites with no other treatment.  The patient elected to proceed with anesthetizing the upper right (option #1).  Anesthesia: Topical:  Benzocaine 20% applied Type of anesthesia used:  34 mg lidocaine, 0.018 mg epinephrine Location given:  Upper right quadrant infiltration and palatal Aspiration negative. The patient was given a chlorhexidine gluconate rinse for 30 seconds.  Irrigation with sterile saline performed in extraction sites; a significant amount of food/debris removed from extraction sites. 1 figure-8 style suture placed on the upper right using 3-0 chromic gut to aid in soft tissue closure. Good hemostasis observed. Irrigation syringe and sterile saline given to the patient to use at home. Verbal instructions were given to the patient on how to use. He verbalized understanding.   PLAN AND RECOMMENDATIONS: Return in 1 week for another follow-up visit.     Try alternating Ibuprofen 600 mg and Tylenol 500 mg every 4-6 hours for up to 3 days for pain.  If you take prescription pain medication, do not take any additional Acetaminophen (Tylenol). Use the prescription mouthrinse 2-3 times a day in a swish and spit manner until your next dental visit. Avoid chewing/eating on the right side and continue soft food diet as needed. Finish antibiotic course as prescribed/instructed. Call if any questions or concerns arise before next visit.  The patient verbalized understanding of discussion, findings, recommendations and treatment options.  Rx:  Chlorhexidine gluconate 0.12%, NORCO 5-325 mg tabs as needed for pain  All questions and concerns were invited and addressed.  The patient tolerated today's visit well and departed in stable condition.  Charlaine Dalton, D.M.D.

## 2022-05-20 NOTE — Patient Instructions (Signed)
[facility] DEPARTMENT OF DENTAL MEDICINE Dr. Debe Coder B. Benson [place], D.M.D. Phone: [phone] Fax: [phone]      It was a pleasure seeing you today!  Please refer to the information below regarding your dental visit with [place].  Please do not hesitate to give [place] a call if any questions or concerns come up after you leave.    Thank you for letting us provide care for you.  If there is anything we can do for you, please let us know.    Try alternating Tylenol 600 mg and Ibuprofen 500 mg every 4-6 hours for up to 3 days.   Rinse with Peridex (prescription mouthwash) 2-3 times a day.  Swish in mouth for 30 seconds and then SPIT.  DO NOT take additional Tylenol if you are taking prescription pain medication.       Questions?  Call our office during office hours at [phone].

## 2022-05-27 ENCOUNTER — Encounter (HOSPITAL_COMMUNITY): Payer: Self-pay | Admitting: Dentistry

## 2022-05-27 ENCOUNTER — Ambulatory Visit (INDEPENDENT_AMBULATORY_CARE_PROVIDER_SITE_OTHER): Payer: Self-pay | Admitting: Dentistry

## 2022-05-27 VITALS — BP 120/49 | HR 46 | Temp 98.1°F

## 2022-05-27 DIAGNOSIS — K08199 Complete loss of teeth due to other specified cause, unspecified class: Secondary | ICD-10-CM

## 2022-05-27 NOTE — Progress Notes (Signed)
Department of Dental Medicine   Service Date:   05/27/2022  Patient Name:  Ryan Duke Date of Birth:   24-Aug-1941 Medical Record Number: [id number]  TODAY'S VISIT: POSTOPERATIVE APPOINTMENT   ASSESSMENT: The patient continues to heal well and consistent with dental procedures performed.  RECOMMENDATIONS: Establish dental care at an outside office of the patient's choice for routine care including cleanings, replacement of missing teeth as needed & periodic exams once medically optimized. Recommend discussing plans to return to the dentist for elective/routine dental care with medical team for new antibiotic prophylaxis recommendations following surgery.  PLAN: Follow-up as needed in the hospital dental clinic. Continue w/ Chlorhexidine gluconate 0.12% rinses for 7 more days Call if any questions or concerns arise.   05/27/2022   HISTORY OF PRESENT ILLNESS: Ryan Duke presents today for a 2nd postoperative visit status-post multiple extractions in the operating room on 05/14/2022.   Medical history reviewed with the patient.  No changes were reported.   CHIEF COMPLAINT:   Here for a postop appointment. The patient reports that he has been doing much better since his last visit. He is using his mouthwash as instructed and he completed his course of antibiotics.   Patient Active Problem List   Diagnosis Date Noted   Loss of teeth due to extraction 05/20/2022   Encounter for preoperative dental examination 05/11/2022   Caries 05/11/2022   Teeth missing 05/11/2022   Retained tooth root 05/11/2022   Chronic apical periodontitis 05/11/2022   Chronic periodontitis 05/11/2022   Phobia of dental procedure 05/11/2022   Accretions on teeth 05/11/2022   Impacted third molar tooth 05/11/2022   Defective dental restoration 05/11/2022   Periapical abscess without sinus 05/11/2022   Severe aortic stenosis    Onychomycosis of toenail 12/04/2019   OAB (overactive bladder)  06/08/2019   Vitamin D deficiency 02/15/2018   Bilateral low back pain with sciatica 11/01/2017   Mild intermittent asthma without complication 66/44/0347   Primary gout 07/07/2016   Routine general medical examination at a health care facility 09/18/2011   GERD 07/22/2010   BPH (benign prostatic hyperplasia) 09/02/2009   Kidney disease, chronic, 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) (HCC) 02/26/2009   Aortic valve disorder 02/25/2009   PERIPHERAL VASCULAR DISEASE 02/25/2009   Type II diabetes mellitus with manifestations ([place]) 11/01/2007   Hyperlipidemia with target LDL less than 100 07/01/2007   Morbid obesity ([place]) 07/01/2007   Essential hypertension 07/01/2007   Past Medical History:  Diagnosis Date   Aortic stenosis    severe AS 04/14/22   Diabetes mellitus    type 2   Hyperlipidemia    Hypertension    Hypogonadism, male    Pancreatitis 11/30/2006   Renal insufficiency    Past Surgical History:  Procedure Laterality Date   ALVEOLOPLASTY N/A 05/14/2022   Procedure: ALVEOLOPLASTY;  Surgeon: Charlaine Dalton, DMD;  Location: [facility];  Service: Dentistry;  Laterality: N/A;   CARDIAC CATHETERIZATION     EYE SURGERY     cataracts   FLEXIBLE SIGMOIDOSCOPY N/A 09/10/2017   Procedure: FLEXIBLE SIGMOIDOSCOPY;  Surgeon: Mauri Pole, MD;  Location: [facility];  Service: Endoscopy;  Laterality: N/A;   IR KYPHO LUMBAR INC FX REDUCE BONE BX UNI/BIL CANNULATION INC/IMAGING  09/14/2017   IR RADIOLOGIST EVAL & MGMT  10/07/2017   RIGHT HEART CATH AND CORONARY ANGIOGRAPHY N/A 05/07/2022   Procedure: RIGHT HEART CATH AND CORONARY ANGIOGRAPHY;  Surgeon: Sherren Mocha, MD;  Location: [place] [facility];  Service: Cardiovascular;  Laterality: N/A;   TONSILLECTOMY     TOOTH EXTRACTION N/A 05/14/2022   Procedure: DENTAL EXTRACTIONS TEETH NUMBER TWO, THREE, FOUR, FIVE, FOURTEEN, FIFTEEN, TWENTY;  Surgeon: Charlaine Dalton, DMD;  Location: [place];  Service: Dentistry;  Laterality: N/A;   Current  Outpatient Medications  Medication Sig Dispense Refill   atenolol-chlorthalidone (TENORETIC) 100-25 MG tablet TAKE ONE TABLET BY MOUTH ONCE DAILY 90 tablet 1   chlorhexidine (PERIDEX) 0.12 % solution Use as directed 15 mLs in the mouth or throat 2 (two) times daily for 14 days. 420 mL 0   Continuous Blood Gluc Receiver (FREESTYLE LIBRE 2 READER) DEVI 1 Act by Does not apply route daily. 2 each 5   Continuous Blood Gluc Sensor (FREESTYLE LIBRE 2 SENSOR) MISC USE AS DIRECTED AND REPLACE EVERY 14 DAYS. 2 each 5   DULoxetine (CYMBALTA) 60 MG capsule TAKE ONE CAPSULE DAILY 90 capsule 1   glucagon (GLUCAGEN HYPOKIT) 1 MG SOLR injection Inject 1 mg into the vein once as needed for up to 1 dose for low blood sugar. (Patient not taking: Reported on 04/30/2022) 2 each 2   Insulin Glargine-Lixisenatide (SOLIQUA) 100-33 UNT-MCG/ML SOPN Inject 60 Units into the skin daily. (Patient taking differently: Inject 50 Units into the skin daily with breakfast.) 54 mL 1   Insulin Pen Needle (NOVOFINE) 32G X 6 MM MISC 1 Act by Does not apply route daily. 100 each 1   JARDIANCE 25 MG TABS tablet TAKE ONE TABLET ONCE DAILY BEFORE BREAKFAST. 90 tablet 1   KERENDIA 20 MG TABS Take 1 tablet by mouth daily. 90 tablet 0   losartan (COZAAR) 100 MG tablet Take 1 tablet (100 mg total) by mouth daily. for high blood pressure 90 tablet 1   Multiple Vitamin (MULTIVITAMIN WITH MINERALS) TABS tablet Take 1 tablet by mouth 2 (two) times a week.     Multiple Vitamins-Minerals (PRESERVISION AREDS PO) Take 1 tablet by mouth in the morning and at bedtime.     MYRBETRIQ 50 MG TB24 tablet TAKE 1 TABLET ONCE DAILY. 90 tablet 1   pravastatin (PRAVACHOL) 40 MG tablet TAKE ONE TABLET BY MOUTH ONCE DAILY. 90 tablet 1   pregabalin (LYRICA) 150 MG capsule Take 150 mg by mouth 2 (two) times daily.     tamsulosin (FLOMAX) 0.4 MG CAPS capsule TAKE ONE CAPSULE BY MOUTH DAILY 90 capsule 1   vitamin B-12 (CYANOCOBALAMIN) 500 MCG tablet Take 500 mcg by  mouth daily.     No current facility-administered medications for this visit.   Allergies  Allergen Reactions   Farxiga [Dapagliflozin] Other (See Comments)    gout   Metformin And Related Other (See Comments)    acidosis   Atorvastatin Other (See Comments)    REACTION: h/a   Benazepril Hcl Other (See Comments)    Was not effective   Flexeril [Cyclobenzaprine] Other (See Comments)    HALLUSINATIONS   Oxycodone Other (See Comments)    HALLUSINATIONS    LABS: Lab Results  Component Value Date   WBC 9.1 05/04/2022   HGB 14.3 05/07/2022   HGB 14.6 05/07/2022   HCT 42.0 05/07/2022   HCT 43.0 05/07/2022   MCV 91 05/04/2022   PLT 226 05/04/2022   BMET    Component Value Date/Time   NA 139 05/18/2022 0841   NA 140 05/04/2022 1439   K 3.6 05/18/2022 0841   CL 103 05/18/2022 0841   CO2 27 05/18/2022 0841   GLUCOSE 106 (H) 05/18/2022  8719   GLUCOSE 169 (H) 10/06/2006 0741   BUN 31 (H) 05/18/2022 0841   BUN 32 (H) 05/04/2022 1439   CREATININE 1.51 (H) 05/18/2022 0841   CALCIUM 9.6 05/18/2022 0841   EGFR 48 (L) 05/04/2022 1439   GFRNONAA 46 (L) 05/18/2022 0841    Lab Results  Component Value Date   INR 1.04 09/14/2017   No results found for: "PTT"   VITALS: BP (!) 120/49 (BP Location: Right Arm, Patient Position: Sitting, Cuff Size: Normal)   Pulse (!) 46   Temp 98.1 F (36.7 C) (Oral)    EXAM: Extraction sites appear to be healing WNL.  No signs of wound dehiscence or infection evident upon examination.  No sutures remain in-tact. #1 occlusal surface is still visible, but soft tissue has begun to close nicely anterior to the tooth; it is unlikely there will be complete closure over #1.   ASSESSMENT:   Postoperative course is consistent with dental procedures performed.   PLAN AND RECOMMENDATIONS: Follow-up as needed in the hospital dental clinic.    Establish care at an outside dental office for routine dental care including replacement of missing teeth  as needed, cleanings and exams.   Recommend that the patient discuss plans to return to the dentist for routine care with their medical team to ensure they are medically optimized and for antibiotic prophylaxis recommendations. Call if any questions or concerns arise. I discussed with the patient that if his 3rd molar (the impacted wisdom tooth on the upper right) becomes symptomatic such as painful, swollen, sensitive to hot temperatures, etc., to contact us to schedule an appointment.  I explained that his wisdom tooth may need to be extracted by an oral surgeon (as it is very close to the maxillary sinus) if the tooth becomes problematic.  He verbalized understanding. Rx:  Continue Chlorhexidine gluconate 0.12% rinses for 7 more days   All questions and concerns were invited and addressed.  The patient tolerated today's visit well and departed in stable condition.   Charlaine Dalton, D.M.D.

## 2022-05-29 ENCOUNTER — Other Ambulatory Visit: Payer: Self-pay | Admitting: Internal Medicine

## 2022-05-29 ENCOUNTER — Telehealth: Payer: Self-pay

## 2022-05-29 DIAGNOSIS — E118 Type 2 diabetes mellitus with unspecified complications: Secondary | ICD-10-CM

## 2022-05-29 MED ORDER — SOLIQUA 100-33 UNT-MCG/ML ~~LOC~~ SOPN: 60.0000 [IU] | PEN_INJECTOR | Freq: Every day | SUBCUTANEOUS | 1 refills | Status: DC

## 2022-05-29 NOTE — Telephone Encounter (Signed)
Pt is calling requesting the samples for Insulin Glargine-Lixisenatide (SOLIQUA) 100-33 UNT-MCG/ML SOPN  Pt is completely out of the medication. Pt is has been out for a couple of days.   Please advise.

## 2022-05-29 NOTE — Telephone Encounter (Signed)
Pt has been informed Rx sent tp pharmacy

## 2022-06-18 ENCOUNTER — Other Ambulatory Visit: Payer: Self-pay | Admitting: Internal Medicine

## 2022-06-29 ENCOUNTER — Encounter: Payer: Self-pay | Admitting: Physician Assistant

## 2022-07-08 ENCOUNTER — Institutional Professional Consult (permissible substitution): Payer: Medicare Other | Admitting: Surgery

## 2022-07-08 ENCOUNTER — Other Ambulatory Visit: Payer: Self-pay

## 2022-07-08 ENCOUNTER — Encounter: Payer: Self-pay | Admitting: Surgery

## 2022-07-08 VITALS — BP 100/48 | HR 63 | Resp 18 | Ht 71.0 in | Wt 233.0 lb

## 2022-07-08 DIAGNOSIS — I350 Nonrheumatic aortic (valve) stenosis: Secondary | ICD-10-CM

## 2022-07-08 NOTE — Progress Notes (Signed)
Patient ID: Ryan Duke, male   DOB: [birth year], 81 y.o.   MRN: [id number]  [facility]VE CLINIC         [address]       [place] [address]             [phone]          CARDIOTHORACIC SURGERY CONSULTATION REPORT  PCP is Janith Lima, MD Referring Provider is Sherren Mocha, MD Primary Cardiologist is Sherren Mocha, MD  Reason for consultation:  Severe aortic stenosis  HPI:  The patient is an 81 year old gentleman with a history of type 2 diabetes, hypertension, hyperlipidemia, stage III chronic kidney disease, and aortic stenosis that has been followed by Dr. Burt Knack.  His most recent echo shows an increase in the mean gradient is 51 mmHg consistent with severe aortic stenosis.  There is mild aortic insufficiency.  Left ventricular wall may have been noticed with greater than 75%.  He is here today with his wife.  He reports progressive symptoms of exertional fatigue and shortness of breath at low level activity.  He has had a few episodes of dizziness with activity and bending over.  He denies any chest pain or pressure.  He denies peripheral edema.   Past Medical History:  Diagnosis Date   Diabetes mellitus    type 2   Hyperlipidemia    Hypertension    Hypogonadism, male    Pancreatitis 11/30/2006   Renal insufficiency    Severe aortic stenosis     Past Surgical History:  Procedure Laterality Date   ALVEOLOPLASTY N/A 05/14/2022   Procedure: ALVEOLOPLASTY;  Surgeon: Charlaine Dalton, DMD;  Location: [facility];  Service: Dentistry;  Laterality: N/A;   CARDIAC CATHETERIZATION     EYE SURGERY     cataracts   FLEXIBLE SIGMOIDOSCOPY N/A 09/10/2017   Procedure: FLEXIBLE SIGMOIDOSCOPY;  Surgeon: Mauri Pole, MD;  Location: [facility];  Service: Endoscopy;  Laterality: N/A;   IR KYPHO LUMBAR INC FX REDUCE BONE BX UNI/BIL CANNULATION INC/IMAGING  09/14/2017   IR RADIOLOGIST EVAL & MGMT   10/07/2017   RIGHT HEART CATH AND CORONARY ANGIOGRAPHY N/A 05/07/2022   Procedure: RIGHT HEART CATH AND CORONARY ANGIOGRAPHY;  Surgeon: Sherren Mocha, MD;  Location: [facility];  Service: Cardiovascular;  Laterality: N/A;   TONSILLECTOMY     TOOTH EXTRACTION N/A 05/14/2022   Procedure: DENTAL EXTRACTIONS TEETH NUMBER TWO, THREE, FOUR, FIVE, FOURTEEN, FIFTEEN, TWENTY;  Surgeon: Charlaine Dalton, DMD;  Location: [facility];  Service: Dentistry;  Laterality: N/A;    Family History  Problem Relation Age of Onset   Hypertension Other     Social History   Socioeconomic History   Marital status: Married    Spouse name: Not on file   Number of children: Not on file   Years of education: Not on file   Highest education level: Not on file  Occupational History   Occupation: retired  Tobacco Use   Smoking status: Former    Types: Cigarettes    Quit date: 11/30/1974    Years since quitting: 47.6    Passive exposure: Never   Smokeless tobacco: Never  Vaping Use   Vaping Use: Never used  Substance and Sexual Activity   Alcohol use: Not Currently    Alcohol/week: 0.0 standard drinks of alcohol   Drug use: No   Sexual activity: Yes  Other Topics Concern   Not on  file  Social History Narrative   Regular exercise-yes   Social Determinants of Health   Financial Resource Strain: Low Risk  (03/02/2022)   Overall Financial Resource Strain (CARDIA)    Difficulty of Paying Living Expenses: Not hard at all  Food Insecurity: No Food Insecurity (03/02/2022)   Hunger Vital Sign    Worried About Running Out of Food in the Last Year: Never true    Ran Out of Food in the Last Year: Never true  Transportation Needs: No Transportation Needs (03/02/2022)   PRAPARE - Hydrologist (Medical): No    Lack of Transportation (Non-Medical): No  Physical Activity: Insufficiently Active (03/02/2022)   Exercise Vital Sign    Days of Exercise per Week: 2 days    Minutes of Exercise  per Session: 20 min  Stress: No Stress Concern Present (03/02/2022)   [place]    Feeling of Stress : Not at all  Social Connections: Moderately Isolated (03/02/2022)   Social Connection and Isolation Panel [NHANES]    Frequency of Communication with Friends and Family: Twice a week    Frequency of Social Gatherings with Friends and Family: Twice a week    Attends Religious Services: Never    Marine scientist or Organizations: No    Attends Archivist Meetings: Never    Marital Status: Married  Human resources officer Violence: Not At Risk (03/02/2022)   Humiliation, Afraid, Rape, and Kick questionnaire    Fear of Current or Ex-Partner: No    Emotionally Abused: No    Physically Abused: No    Sexually Abused: No    Prior to Admission medications   Medication Sig Start Date End Date Taking? Authorizing Provider  atenolol-chlorthalidone (TENORETIC) 100-25 MG tablet TAKE ONE TABLET BY MOUTH ONCE DAILY 02/13/22  Yes Janith Lima, MD  Continuous Blood Gluc Receiver (FREESTYLE LIBRE 2 READER) DEVI 1 Act by Does not apply route daily. 10/10/20  Yes Janith Lima, MD  Continuous Blood Gluc Sensor (FREESTYLE LIBRE 2 SENSOR) MISC USE AS DIRECTED AND REPLACE EVERY 14 DAYS. 03/10/22  Yes Janith Lima, MD  DULoxetine (CYMBALTA) 60 MG capsule TAKE ONE CAPSULE DAILY 04/28/22  Yes Janith Lima, MD  Insulin Glargine-Lixisenatide (SOLIQUA) 100-33 UNT-MCG/ML SOPN Inject 60 Units into the skin daily. 05/29/22  Yes Janith Lima, MD  Insulin Pen Needle (NOVOFINE) 32G X 6 MM MISC 1 Act by Does not apply route daily. 06/04/20  Yes Janith Lima, MD  JARDIANCE 25 MG TABS tablet TAKE ONE TABLET ONCE DAILY BEFORE BREAKFAST. 04/03/22  Yes Janith Lima, MD  KERENDIA 20 MG TABS Take 1 tablet by mouth daily. 05/10/22  Yes Janith Lima, MD  losartan (COZAAR) 100 MG tablet Take 1 tablet (100 mg total) by mouth daily. for high blood  pressure 03/11/22  Yes Janith Lima, MD  Multiple Vitamin (MULTIVITAMIN WITH MINERALS) TABS tablet Take 1 tablet by mouth 2 (two) times a week.   Yes [provider]  Multiple Vitamins-Minerals (PRESERVISION AREDS PO) Take 1 tablet by mouth in the morning and at bedtime.   Yes [provider]  MYRBETRIQ 50 MG TB24 tablet TAKE 1 TABLET ONCE DAILY. 07/17/20  Yes Janith Lima, MD  pravastatin (PRAVACHOL) 40 MG tablet TAKE ONE TABLET BY MOUTH ONCE DAILY. 02/13/22  Yes Janith Lima, MD  pregabalin (LYRICA) 150 MG capsule Take 150 mg by mouth 2 (  two) times daily. 03/26/20  Yes [provider]  tamsulosin (FLOMAX) 0.4 MG CAPS capsule TAKE ONE CAPSULE BY MOUTH DAILY 06/18/22  Yes Janith Lima, MD  vitamin B-12 (CYANOCOBALAMIN) 500 MCG tablet Take 500 mcg by mouth daily.   Yes [provider]  glucagon (GLUCAGEN HYPOKIT) 1 MG SOLR injection Inject 1 mg into the vein once as needed for up to 1 dose for low blood sugar. Patient not taking: Reported on 04/30/2022 01/27/21   Janith Lima, MD    Current Outpatient Medications  Medication Sig Dispense Refill   atenolol-chlorthalidone (TENORETIC) 100-25 MG tablet TAKE ONE TABLET BY MOUTH ONCE DAILY 90 tablet 1   Continuous Blood Gluc Receiver (FREESTYLE LIBRE 2 READER) DEVI 1 Act by Does not apply route daily. 2 each 5   Continuous Blood Gluc Sensor (FREESTYLE LIBRE 2 SENSOR) MISC USE AS DIRECTED AND REPLACE EVERY 14 DAYS. 2 each 5   DULoxetine (CYMBALTA) 60 MG capsule TAKE ONE CAPSULE DAILY 90 capsule 1   Insulin Glargine-Lixisenatide (SOLIQUA) 100-33 UNT-MCG/ML SOPN Inject 60 Units into the skin daily. 54 mL 1   Insulin Pen Needle (NOVOFINE) 32G X 6 MM MISC 1 Act by Does not apply route daily. 100 each 1   JARDIANCE 25 MG TABS tablet TAKE ONE TABLET ONCE DAILY BEFORE BREAKFAST. 90 tablet 1   KERENDIA 20 MG TABS Take 1 tablet by mouth daily. 90 tablet 0   losartan (COZAAR) 100 MG tablet Take 1 tablet (100 mg total)  by mouth daily. for high blood pressure 90 tablet 1   Multiple Vitamin (MULTIVITAMIN WITH MINERALS) TABS tablet Take 1 tablet by mouth 2 (two) times a week.     Multiple Vitamins-Minerals (PRESERVISION AREDS PO) Take 1 tablet by mouth in the morning and at bedtime.     MYRBETRIQ 50 MG TB24 tablet TAKE 1 TABLET ONCE DAILY. 90 tablet 1   pravastatin (PRAVACHOL) 40 MG tablet TAKE ONE TABLET BY MOUTH ONCE DAILY. 90 tablet 1   pregabalin (LYRICA) 150 MG capsule Take 150 mg by mouth 2 (two) times daily.     tamsulosin (FLOMAX) 0.4 MG CAPS capsule TAKE ONE CAPSULE BY MOUTH DAILY 90 capsule 1   vitamin B-12 (CYANOCOBALAMIN) 500 MCG tablet Take 500 mcg by mouth daily.     glucagon (GLUCAGEN HYPOKIT) 1 MG SOLR injection Inject 1 mg into the vein once as needed for up to 1 dose for low blood sugar. (Patient not taking: Reported on 04/30/2022) 2 each 2   No current facility-administered medications for this visit.    Allergies  Allergen Reactions   Farxiga [Dapagliflozin] Other (See Comments)    gout   Metformin And Related Other (See Comments)    acidosis   Atorvastatin Other (See Comments)    REACTION: h/a   Benazepril Hcl Other (See Comments)    Was not effective   Flexeril [Cyclobenzaprine] Other (See Comments)    HALLUSINATIONS   Oxycodone Other (See Comments)    HALLUSINATIONS      Review of Systems:   General:  normal appetite, + decreased energy, no weight gain, no weight loss, no fever  Cardiac:  no chest pain with exertion, no chest pain at rest, +SOB with mild exertion, no resting SOB, no PND, no orthopnea, no palpitations, no arrhythmia, no atrial fibrillation, no LE edema, + dizzy spells, no syncope  Respiratory:  + exertional shortness of breath, no home oxygen, no productive cough, no dry cough, no bronchitis, no wheezing, no hemoptysis,  on asthma, no pain with inspiration or cough, no sleep apnea, no CPAP at night  GI:   no difficulty swallowing, no reflux, no frequent  heartburn, no hiatal hernia, no abdominal pain, no constipation, no diarrhea, no hematochezia, no hematemesis, no melena  GU:   no dysuria,  no frequency, no urinary tract infection, no hematuria, no enlarged prostate, no kidney stones, no kidney disease  Vascular:  no pain suggestive of claudication, no pain in feet, no leg cramps, no varicose veins, no DVT, no non-healing foot ulcer  Neuro:   no stroke, no TIA's, no seizures, no headaches, no temporary blindness one eye,  no slurred speech, no peripheral neuropathy, no chronic pain, + instability of gait, no memory/cognitive dysfunction  Musculoskeletal: no arthritis, no joint swelling, no myalgias, + difficulty walking, + reduced mobility   Skin:   no rash, no itching, no skin infections, no pressure sores or ulcerations  Psych:   no anxiety, no depression, no nervousness, no unusual recent stress  Eyes:   no blurry vision, no floaters, no recent vision changes, + wears glasses   ENT:   no hearing loss, no loose or painful teeth, no dentures, last saw dentist in June for extraction of 7 teeth (Dr. Benson Norway)  Hematologic:  no easy bruising, no abnormal bleeding, no clotting disorder, no frequent epistaxis  Endocrine:  + diabetes, does check CBG's at home     Physical Exam:   BP (!) 100/48 (BP Location: Left Arm, Patient Position: Sitting)   Pulse 63   Resp 18   Ht '5\' 11"'$  (1.803 m)   Wt 233 lb (105.7 kg)   SpO2 91% Comment: RA  BMI 32.50 kg/m   General:  well-appearing  HEENT:  Unremarkable, NCAT, PERLA, EOMI  Neck:   no JVD, no bruits, no adenopathy   Chest:   clear to auscultation, symmetrical breath sounds, no wheezes, no rhonchi   CV:   RRR, 3/6 systolic murmur RSB, no diastolic murmur  Abdomen:  soft, non-tender, no masses   Extremities:  warm, well-perfused, pulses palpable at ankles, no lower extremity edema  Rectal/GU  Deferred  Neuro:   Grossly non-focal and symmetrical throughout  Skin:   Clean and dry, no rashes, no  breakdown  Diagnostic Tests:  ECHOCARDIOGRAM REPORT         Patient Name:   Ryan Duke Date of Exam: 04/14/2022  Medical Rec #:  [id number]       Height:       71.0 in  Accession #:    [id number]      Weight:       232.0 lb  Date of Birth:  Oct 05, 1941       BSA:          2.246 m  Patient Age:    23 years        BP:           132/80 mmHg  Patient Gender: M               HR:           52 bpm.  Exam Location:  [address]   Procedure: 2D Echo, Cardiac Doppler, Color Doppler and Intracardiac             Opacification Agent   Indications:    I35 Aortic stenosis     History:        Patient has prior history of Echocardiogram examinations,  most  recent 10/15/2021. PVD, Aortic Valve Disease; Risk                  Factors:Hypertension, Diabetes, Dyslipidemia and Former  Smoker.                  CKD stage 3. Morbid obesity.     Sonographer:    Basilia Jumbo BS, RDCS  Referring Phys: [place]     1. Left ventricular ejection fraction, by estimation, is >75%. The left  ventricle has hyperdynamic function. The left ventricle has no regional  wall motion abnormalities. There is moderate asymmetric left ventricular  hypertrophy of the basal-septal  segment. Left ventricular diastolic parameters are consistent with Grade I  diastolic dysfunction (impaired relaxation).   2. Right ventricular systolic function is normal. The right ventricular  size is normal. Tricuspid regurgitation signal is inadequate for assessing  PA pressure.   3. Left atrial size was mildly dilated.   4. Right atrial size was mildly dilated.   5. The mitral valve is abnormal. Trivial mitral valve regurgitation.   6. The aortic valve has an indeterminant number of cusps. There is severe  calcifcation of the aortic valve. Aortic valve regurgitation is mild.  Severe aortic valve stenosis. Aortic valve area, by VTI measures 0.97 cm.  Aortic valve mean gradient  measures  51.0 mmHg. Aortic valve Vmax measures 4.70 m/s. Peak gradient  88.4 mmHg. Based on an LVOT diameter of 2.1 cm. DI is 0.28.   7. Aortic dilatation noted. There is borderline dilatation of the aortic  root, measuring 39 mm. There is mild dilatation of the ascending aorta,  measuring 41 mm.   8. The inferior vena cava is normal in size with <50% respiratory  variability, suggesting right atrial pressure of 8 mmHg.   Comparison(s): 10/15/21 EF 70-75%. Severe AS 53mHg mean PG, 864mg peak  PG. Mild AI.   FINDINGS   Left Ventricle: Left ventricular ejection fraction, by estimation, is  >75%. The left ventricle has hyperdynamic function. The left ventricle has  no regional wall motion abnormalities. The left ventricular internal  cavity size was small. There is  moderate asymmetric left ventricular hypertrophy of the basal-septal  segment. Left ventricular diastolic parameters are consistent with Grade I  diastolic dysfunction (impaired relaxation). Indeterminate filling  pressures.   Right Ventricle: The right ventricular size is normal. No increase in  right ventricular wall thickness. Right ventricular systolic function is  normal. Tricuspid regurgitation signal is inadequate for assessing PA  pressure.   Left Atrium: Left atrial size was mildly dilated.   Right Atrium: Right atrial size was mildly dilated.   Pericardium: There is no evidence of pericardial effusion.   Mitral Valve: The mitral valve is abnormal. There is mild calcification of  the posterior and anterior mitral valve leaflet(s). Trivial mitral valve  regurgitation.   Tricuspid Valve: The tricuspid valve is grossly normal. Tricuspid valve  regurgitation is trivial.   Aortic Valve: The aortic valve has an indeterminant number of cusps. There  is severe calcifcation of the aortic valve. Aortic valve regurgitation is  mild. Severe aortic stenosis is present. Aortic valve mean gradient  measures 51.0 mmHg. Aortic  valve peak   gradient measures 88.4 mmHg. Aortic valve area, by VTI measures 0.97 cm.   Pulmonic Valve: The pulmonic valve was normal in structure. Pulmonic valve  regurgitation is not visualized.   Aorta: Aortic dilatation noted. There is borderline dilatation of the  aortic root,  measuring 39 mm. There is mild dilatation of the ascending  aorta, measuring 41 mm.   Venous: The inferior vena cava is normal in size with less than 50%  respiratory variability, suggesting right atrial pressure of 8 mmHg.   IAS/Shunts: No atrial level shunt detected by color flow Doppler.      LEFT VENTRICLE  PLAX 2D  LVIDd:         5.20 cm   Diastology  LVIDs:         2.60 cm   LV e' medial:    6.08 cm/s  LV PW:         1.10 cm   LV E/e' medial:  7.4  LV IVS:        1.50 cm   LV e' lateral:   3.73 cm/s  LVOT diam:     2.10 cm   LV E/e' lateral: 12.1  LV SV:         114  LV SV Index:   51  LVOT Area:     3.46 cm      RIGHT VENTRICLE             IVC  RV Basal diam:  4.00 cm     IVC diam: 2.00 cm  RV S prime:     11.70 cm/s  TAPSE (M-mode): 2.5 cm   LEFT ATRIUM             Index        RIGHT ATRIUM           Index  LA diam:        4.20 cm 1.87 cm/m   RA Pressure: 8.00 mmHg  LA Vol (A2C):   88.5 ml 39.41 ml/m  RA Area:     25.80 cm  LA Vol (A4C):   70.5 ml 31.39 ml/m  RA Volume:   83.40 ml  37.14 ml/m  LA Biplane Vol: 81.8 ml 36.42 ml/m   AORTIC VALVE  AV Area (Vmax):    0.91 cm  AV Area (Vmean):   0.92 cm  AV Area (VTI):     0.97 cm  AV Vmax:           470.00 cm/s  AV Vmean:          334.000 cm/s  AV VTI:            1.180 m  AV Peak Grad:      88.4 mmHg  AV Mean Grad:      51.0 mmHg  LVOT Vmax:         124.00 cm/s  LVOT Vmean:        88.800 cm/s  LVOT VTI:          0.330 m  LVOT/AV VTI ratio: 0.28     AORTA  Ao Root diam: 3.90 cm  Ao Asc diam:  4.10 cm   MITRAL VALVE               TRICUSPID VALVE                             Estimated RAP:  8.00 mmHg  MV Decel Time: 359  msec  MV E velocity: 45.20 cm/s  SHUNTS  MV A velocity: 82.30 cm/s  Systemic VTI:  0.33 m  MV E/A ratio:  0.55        Systemic Diam: 2.10 cm   Lyman Bishop MD  Electronically signed by Lyman Bishop MD  Signature Date/Time: 04/14/2022/1:53:29 PM         Final     Physicians  Panel Physicians Referring Physician Case Authorizing Physician  Sherren Mocha, MD (Primary)     Procedures  RIGHT HEART CATH AND CORONARY ANGIOGRAPHY   Conclusion      There is severe aortic valve stenosis.   1.  Widely patent coronary arteries with no significant stenoses 2.  Known severe aortic stenosis with heavy calcification and leaflet restriction seen on plain fluoroscopy 3.  Normal right heart pressures and hemodynamics.   PLAN: Continue TAVR evaluation   Indications  Severe aortic stenosis [I35.0 (ICD-10-CM)]   Procedural Details  Technical Details INDICATION: Severe aortic stenosis.  Pre-TAVR evaluation.  PROCEDURAL DETAILS: There was an indwelling IV in a right antecubital vein. Using normal sterile technique, the IV was changed out for a 5 Fr brachial sheath over a 0.018 inch wire. The right wrist was then prepped, draped, and anesthetized with 1% lidocaine. Using the modified Seldinger technique a 5/6 French Slender sheath was placed in the right radial artery. Intra-arterial verapamil was administered through the radial artery sheath. IV heparin was administered after a JR4 catheter was advanced into the central aorta. A Swan-Ganz catheter was used for the right heart catheterization. Standard protocol was followed for recording of right heart pressures and sampling of oxygen saturations. Fick cardiac output was calculated. Standard Judkins catheters were used for selective coronary angiography.  The aortic valve is known to be severely stenosed.  It is heavily calcified and restricted by plain fluoroscopy.  No attempt was made to cross the valve today.  There were no immediate  procedural complications. The patient was transferred to the post catheterization recovery area for further monitoring.      Estimated blood loss <50 mL.   During this procedure medications were administered to achieve and maintain moderate conscious sedation while the patient's heart rate, blood pressure, and oxygen saturation were continuously monitored and I was present face-to-face 100% of this time.   Medications (Filter: Administrations occurring from 1219 to 1320 on 05/07/22)  important  Continuous medications are totaled by the amount administered until 05/07/22 1320.   midazolam (VERSED) injection (mg) Total dose:  2 mg  Date/Time Rate/Dose/Volume Action   05/07/22 1240 2 mg Given    Heparin (Porcine) in NaCl 1000-0.9 UT/500ML-% SOLN (mL) Total volume:  1,000 mL  Date/Time Rate/Dose/Volume Action   05/07/22 1248 500 mL Given   1248 500 mL Given    lidocaine (PF) (XYLOCAINE) 1 % injection (mL) Total volume:  4 mL  Date/Time Rate/Dose/Volume Action   05/07/22 1252 2 mL Given   1253 2 mL Given    Radial Cocktail/Verapamil only (mL) Total volume:  10 mL  Date/Time Rate/Dose/Volume Action   05/07/22 1255 10 mL Given    heparin sodium (porcine) injection (Units) Total dose:  5,000 Units  Date/Time Rate/Dose/Volume Action   05/07/22 1302 5,000 Units Given    iohexol (OMNIPAQUE) 350 MG/ML injection (mL) Total volume:  45 mL  Date/Time Rate/Dose/Volume Action   05/07/22 1316 45 mL Given    Contrast  Medication Name Total Dose  iohexol (OMNIPAQUE) 350 MG/ML injection 45 mL   Radiation/Fluoro  Fluoro time: 3.3 (min) DAP: 86578 (mGycm2) Cumulative Air Kerma: 469 (mGy) Complications  Complications documented before study signed (05/07/2022  6:29 PM)   No complications were associated with this study.  Documented by Bethann Punches, RN - 05/07/2022  1:14  PM     Coronary Findings  Diagnostic Dominance: Right Left Main  Vessel is large. The vessel  exhibits minimal luminal irregularities.    Left Anterior Descending  The LAD reaches the apex with no significant stenoses throughout. The vessel has minimal irregularity.    Left Circumflex  Vessel is moderate in size. The circumflex is patent. There is no significant stenosis throughout. The OM branches are patent.    Right Coronary Artery  Vessel is large. The vessel exhibits minimal luminal irregularities. Large, dominant vessel. The PDA and PLA branches are both large. The vessel is patent with minimal irregularity but no significant stenosis throughout.    Intervention   No interventions have been documented.   Left Heart  Aortic Valve There is severe aortic valve stenosis. The aortic valve is calcified. There is restricted aortic valve motion.   Coronary Diagrams  Diagnostic Dominance: Right  Intervention  Implants     No implant documentation for this case.   Syngo Images   Show images for CARDIAC CATHETERIZATION Images on Long Term Storage   Show images for Ketterman, RANDEEP BIONDOLILLO to Procedure Log  Procedure Log    Hemo Data  Flowsheet Row Most Recent Value  Fick Cardiac Output 6.82 L/min  Fick Cardiac Output Index 3.04 (L/min)/BSA  RA A Wave 8 mmHg  RA V Wave 4 mmHg  RA Mean 4 mmHg  RV Systolic Pressure 32 mmHg  RV Diastolic Pressure 1 mmHg  RV EDP 7 mmHg  PA Systolic Pressure 32 mmHg  PA Diastolic Pressure 10 mmHg  PA Mean 22 mmHg  PW A Wave 17 mmHg  PW V Wave 14 mmHg  PW Mean 12 mmHg  AO Systolic Pressure 833 mmHg  AO Diastolic Pressure 74 mmHg  AO Mean 88 mmHg  QP/QS 1  TPVR Index 7.24 HRUI  TSVR Index 28.97 HRUI  PVR SVR Ratio 0.12  TPVR/TSVR Ratio 0.25    ADDENDUM REPORT: 05/20/2022 09:03   CLINICAL DATA:  Severe Aortic Stenosis.   EXAM: Cardiac TAVR CT   TECHNIQUE: A non-contrast, gated CT scan was obtained with axial slices of 3 mm through the heart for aortic valve calcium scoring. A 120 kV retrospective, gated, contrast  cardiac scan was obtained. Gantry rotation speed was 250 msecs and collimation was 0.6 mm. Nitroglycerin was not given. The 3D data set was reconstructed in 5% intervals of the 0-95% of the R-R cycle. Systolic and diastolic phases were analyzed on a dedicated workstation using MPR, MIP, and VRT modes. The patient received 100 cc of contrast.   FINDINGS: Image quality: Excellent.   Noise artifact is: Limited.   Valve Morphology: Tricuspid aortic valve with severe calcifications. The Fleming contains bulky calcifications and this leaflet is immobile. The calcification extends into the commissure between the NCC/LCC. Calcifications of the Bolton and LCC extend into the annulus and LVOT.   Aortic Valve Calcium score: 6313   Aortic annular dimension:   Phase assessed: 25%   Annular area: 532 mm2   Annular perimeter: 82.8 mm   Max diameter: 28.7 mm   Min diameter: 24.8 mm   Annular and subannular calcification: Mild annular calcifications under the Virgil and LCC.   Membranous septum length: 15.0 mm   Optimal coplanar projection: LAO 14 CAU 16   Coronary Artery Height above Annulus:   Left Main: 15.1 mm   Right Coronary: 18.5 mm   Sinus of Valsalva Measurements:   Non-coronary: 35 mm   Right-coronary: 37 mm  Left-coronary: 37 mm   Sinus of Valsalva Height:   Non-coronary: 27.8 mm   Right-coronary: 25.2 mm   Left-coronary: 23.3 mm   Sinotubular Junction: 34 mm   Ascending Thoracic Aorta: 40 mm   Coronary Arteries: Normal coronary origin. Right dominance. The study was performed without use of NTG and is insufficient for plaque evaluation. Please refer to recent cardiac catheterization for coronary assessment.   Cardiac Morphology:   Right Atrium: Right atrial size is within normal limits.   Right Ventricle: The right ventricular cavity is within normal limits.   Left Atrium: Left atrial size is normal in size with no left atrial appendage filling defect.    Left Ventricle: The ventricular cavity size is within normal limits. There are no stigmata of prior infarction.   Pulmonary arteries: Normal in size without proximal filling defect.   Pulmonary veins: Normal pulmonary venous drainage.   Pericardium: Normal thickness with no significant effusion or calcium present.   Mitral Valve: The mitral valve is normal structure without significant calcification.   Aorta: Severe protruding mobile atheroma noted in the descending aorta after the take off of the left subclavian.   Extra-cardiac findings: See attached radiology report for non-cardiac structures.   IMPRESSION: 1. Severe aortic stenosis with severe bulky calcification of the [place].   2. Annular measurements support a 26 mm S3 TAVR (532 mm2). Would recommend against self-expanding TAVR due to bulky calcifications.   3. Mild annular calcifications under the [place] and [facility].   4. Sufficient coronary to annulus distance.   5. Optimal Fluoroscopic Angle for Delivery: LAO 14 CAU 16   6. Mildly dilated ascending aorta up to 40 mm.   7. 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 Structural heart team should be aware of this.   [place] T. Audie Box, MD     Electronically Signed   By: Eleonore Chiquito M.D.   On: 05/20/2022 09:03    Addended by Geralynn Rile, MD on 05/20/2022  9:06 AM   Study Result  Narrative & Impression  EXAM: OVER-READ INTERPRETATION  CT CHEST   The following report is a limited chest CT over-read performed by radiologist Dr. Salvatore Marvel of [facility], [place] on 05/18/2022. This over-read does not include interpretation of cardiac or coronary anatomy or pathology. The cardiac valve CTA interpretation by the cardiologist is attached.   COMPARISON:  None Available.   FINDINGS: Please see the separate concurrent chest CT angiogram report for details.   IMPRESSION: Please see the  separate concurrent chest CT angiogram report for details.   Electronically Signed: By: Ilona Sorrel M.D. On: 05/18/2022 12:02      Narrative & Impression  CLINICAL DATA:  Aortic valve replacement (TAVR), pre-op eval. 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   EXAM: CT ANGIOGRAPHY CHEST, ABDOMEN AND PELVIS   TECHNIQUE: Multidetector CT imaging through the chest, abdomen and pelvis was performed using the standard protocol during bolus administration of intravenous contrast. Multiplanar reconstructed images and MIPs were obtained and reviewed to evaluate the vascular anatomy.   RADIATION DOSE REDUCTION: This exam was performed according to the departmental dose-optimization program which includes automated exposure control, adjustment of the mA and/or kV according to patient size and/or use of iterative reconstruction technique.   CONTRAST:  169m OMNIPAQUE IOHEXOL 350 MG/ML SOLN   COMPARISON:  None Available.   FINDINGS: CTA CHEST FINDINGS   Cardiovascular: Mild cardiomegaly. Diffuse thickening and  coarse calcification of the aortic valve. No significant pericardial effusion/thickening. Atherosclerotic thoracic aorta with dilated 4.0 cm ascending thoracic aorta. Normal caliber pulmonary arteries. No central pulmonary emboli.   Mediastinum/Nodes: No discrete thyroid nodules. Unremarkable esophagus. No pathologically enlarged axillary, mediastinal or hilar lymph nodes.   Lungs/Pleura: No pneumothorax. No pleural effusion. A few scattered small solid pulmonary nodules in both lungs, largest 0.4 cm in the posterior left lower lobe (series 5/image 66).   Musculoskeletal: No aggressive appearing focal osseous lesions. Marked thoracic spondylosis.   CTA ABDOMEN AND PELVIS FINDINGS   Hepatobiliary: Normal liver with no liver mass. Cholelithiasis. No biliary ductal dilatation.   Pancreas: Small cystic 1.0 cm pancreatic tail lesion with  peripheral coarse calcifications (series 4/image 105). No additional pancreatic lesions. No pancreatic duct dilation.   Spleen: Normal size. No mass.   Adrenals/Urinary Tract: No discrete adrenal nodules. No hydronephrosis. Subcentimeter hypodense anterior upper right renal cortical lesion is too small to characterize, for which no follow-up is recommended. Otherwise no contour deforming renal masses. Normal bladder.   Stomach/Bowel: Normal non-distended stomach. Normal caliber small bowel with no small bowel wall thickening. Normal appendix. Normal large bowel with no diverticulosis, large bowel wall thickening or pericolonic fat stranding.   Vascular/Lymphatic: Atherosclerotic abdominal aorta with mildly dilated 2.8 cm infrarenal abdominal aorta. No pathologically enlarged lymph nodes in the abdomen or pelvis.   Reproductive: Top-normal size prostate with coarse nonspecific internal prostatic calcifications.   Other: No pneumoperitoneum, ascites or focal fluid collection.   Musculoskeletal: No aggressive appearing focal osseous lesions. Chronic severe L2 and L4 vertebral compression fracture status post vertebroplasty. Marked lumbar spondylosis.   VASCULAR MEASUREMENTS PERTINENT TO TAVR:   AORTA:   Minimal Aortic Diameter-16.3 x 11.9 mm   Severity of Aortic Calcification-severe   RIGHT PELVIS:   Right Common Iliac Artery -   Minimal Diameter-12.3 x 8.2 mm   Tortuosity-moderate   Calcification-moderate   Right External Iliac Artery -   Minimal Diameter-8.4 x 8.3 mm   Tortuosity-moderate   Calcification-none   Right Common Femoral Artery -   Minimal Diameter-8.3 x 8.3 mm   Tortuosity-mild   Calcification-none   LEFT PELVIS:   Left Common Iliac Artery -   Minimal Diameter-11.6 x 7.6 mm   Tortuosity-mild   Calcification-moderate   Left External Iliac Artery -   Minimal Diameter-7.8 x 7.4 mm   Tortuosity-mild   Calcification-none   Left  Common Femoral Artery -   Minimal Diameter-7.8 x 7.5 mm   Tortuosity-mild   Calcification-none   Review of the MIP images confirms the above findings.   IMPRESSION: 1. Vascular findings and measurements pertinent to potential TAVR procedure, as detailed. 2. Diffuse thickening and coarse calcification of the aortic valve, compatible with the reported history of aortic stenosis. 3. Mild cardiomegaly. 4. Dilated 4.0 cm ascending thoracic aorta. Recommend annual imaging followup by CTA or MRA. This recommendation follows 2010 ACCF/AHA/AATS/ACR/ASA/SCA/SCAI/SIR/STS/SVM Guidelines for the Diagnosis and Management of Patients with Thoracic Aortic Disease. Circulation. 2010; 121: Z169-C789. Aortic aneurysm NOS (ICD10-I71.9). 5. Mildly dilated 2.8 cm infrarenal abdominal aorta. Recommend follow-up ultrasound every 5 years. This recommendation follows ACR consensus guidelines: White Paper of the ACR Incidental Findings Committee II on Vascular Findings. J Am Coll Radiol 2013; 10:789-794. 6. Cholelithiasis. 7. Small cystic 1.0 cm pancreatic tail lesion with peripheral coarse calcifications. No overtly suspicious CT features. MRI abdomen without and with IV contrast may be considered for further evaluation. 8. A few scattered small solid pulmonary nodules, largest 0.4  cm. No follow-up needed if patient is low-risk (and has no known or suspected primary neoplasm). Non-contrast chest CT can be considered in 12 months if patient is high-risk. This recommendation follows the consensus statement: Guidelines for Management of Incidental Pulmonary Nodules Detected on CT Images: From the Fleischner Society 2017; Radiology 2017; 284:228-243. 9.  Aortic Atherosclerosis (ICD10-I70.0).     Electronically Signed   By: Ilona Sorrel M.D.   On: 05/18/2022 13:48   Impression:  This 81 year old gentleman has stage D1, severe, symptomatic aortic stenosis with [place] heart association class III  symptoms of exertional fatigue and shortness of breath consistent with chronic diastolic congestive heart failure.  I have personally reviewed his 2D echocardiogram, cardiac catheterization, and CTA studies.  His echocardiogram shows a severely calcified and thickened aortic valve with restricted leaflet mobility.  The mean gradient is 51 mmHg with a peak gradient of 88 mmHg.  Aortic valve area by VTI is 0.97 cm.  Left ventricular ejection fraction is greater than 75% with moderate LVH and grade 1 diastolic dysfunction.  Cardiac catheterization shows widely patent coronary arteries without stenosis.  Right heart pressures were normal.  I agree that aortic valve replacement is indicated in this patient for relief of his progressive symptoms and to prevent left ventricular dysfunction.  Given his advanced age and decreased mobility I think that transcatheter aortic valve replacement would be the best option for treating him.  His gated cardiac CTA shows anatomy suitable for TAVR using a SAPIEN 3 valve.  His abdominal and pelvic CTA shows adequate pelvic vascular anatomy to allow transfemoral insertion but there is severe protruding mobile atheroma in the distal aortic arch and proximal descending aorta after the takeoff of the left subclavian artery.  We reviewed his studies at our multidisciplinary heart valve meeting and Dr. Burt Knack and I felt that it would be best to avoid this area to prevent dislodgment and emboli.  His left subclavian artery appears suitable for access and this would avoid the protruding atheroma.  The patient and his wife were counseled at length regarding treatment alternatives for management of severe symptomatic aortic stenosis. The risks and benefits of surgical intervention has been discussed in detail. Long-term prognosis with medical therapy was discussed. Alternative approaches such as conventional surgical aortic valve replacement, transcatheter aortic valve replacement, and  palliative medical therapy were compared and contrasted at length. This discussion was placed in the context of the patient's own specific clinical presentation and past medical history. All of their questions have been addressed.   Following the decision to proceed with transcatheter aortic valve replacement, a discussion was held regarding what types of management strategies would be attempted intraoperatively in the event of life-threatening complications, including whether or not the patient would be considered a candidate for the use of cardiopulmonary bypass and/or conversion to open sternotomy for attempted surgical intervention.  I think he would be a candidate for emergent sternotomy to manage any intraoperative complications.  The patient is aware of the fact that transient use of cardiopulmonary bypass may be necessary. The patient has been advised of a variety of complications that might develop including but not limited to risks of death, stroke, paravalvular leak, aortic dissection or other major vascular complications, aortic annulus rupture, device embolization, cardiac rupture or perforation, mitral regurgitation, acute myocardial infarction, arrhythmia, heart block or bradycardia requiring permanent pacemaker placement, congestive heart failure, respiratory failure, renal failure, pneumonia, infection, other late complications related to structural valve deterioration or migration, or other  complications that might ultimately cause a temporary or permanent loss of functional independence or other long term morbidity. The patient provides full informed consent for the procedure as described and all questions were answered.      Plan:  He will be scheduled for TAVR via left subclavian access on Tuesday, 07/14/2022.  I spent 60 minutes performing this consultation and > 50% of this time was spent face to face counseling and coordinating the care of this patient's severe symptomatic aortic  stenosis.   [name], MD 07/08/2022

## 2022-07-09 NOTE — Pre-Procedure Instructions (Signed)
Surgical Instructions    Your procedure is scheduled on July 14, 2022.  Report to [facility] Main Entrance "A" at 8:45 A.M., then check in with the Admitting office.  Call this number if you have problems the morning of surgery:  [phone]   If you have any questions prior to your surgery date call [phone]: Open Monday-Friday 8am-4pm    Remember:  Do not eat or drink after midnight the night before your surgery     TAKE NO MEDICATIONS THE MORNING OF SURGERY   As of today, STOP taking any Aspirin (unless otherwise instructed by your surgeon) Aleve, Naproxen, Ibuprofen, Motrin, Advil, Goody's, BC's, all herbal medications, fish oil, and all vitamins.  WHAT DO I DO ABOUT MY DIABETES MEDICATION?   Stop taking JARDIANCE three days prior to surgery.  If you take Insulin Glargine-Lixisenatide (SOLIQUA) in the evening/bedtime, do not take dose the night before surgery.   HOW TO MANAGE YOUR DIABETES BEFORE AND AFTER SURGERY  Why is it important to control my blood sugar before and after surgery? Improving blood sugar levels before and after surgery helps healing and can limit problems. A way of improving blood sugar control is eating a healthy diet by:  Eating less sugar and carbohydrates  Increasing activity/exercise  Talking with your doctor about reaching your blood sugar goals High blood sugars (greater than 180 mg/dL) can raise your risk of infections and slow your recovery, so you will need to focus on controlling your diabetes during the weeks before surgery. Make sure that the doctor who takes care of your diabetes knows about your planned surgery including the date and location.  How do I manage my blood sugar before surgery? Check your blood sugar at least 4 times a day, starting 2 days before surgery, to make sure that the level is not too high or low.  Check your blood sugar the morning of your surgery when you wake up and every 2 hours until you get to the  Short Stay unit.  If your blood sugar is less than 70 mg/dL, you will need to treat for low blood sugar: Do not take insulin. Treat a low blood sugar (less than 70 mg/dL) with  cup of clear juice (cranberry or [place]man), 4 glucose tablets, OR glucose gel. Recheck blood sugar in 15 minutes after treatment (to make sure it is greater than 70 mg/dL). If your blood sugar is not greater than 70 mg/dL on recheck, call [phone] for further instructions. Report your blood sugar to the short stay nurse when you get to Short Stay.  If you are admitted to the hospital after surgery: Your blood sugar will be checked by the staff and you will probably be given insulin after surgery (instead of oral diabetes medicines) to make sure you have good blood sugar levels. The goal for blood sugar control after surgery is 80-180 mg/dL.                      Do NOT Smoke (Tobacco/Vaping) for 24 hours prior to your procedure.  If you use a CPAP at night, you may bring your mask/headgear for your overnight stay.   Contacts, glasses, piercing's, hearing aid's, dentures or partials may not be worn into surgery, please bring cases for these belongings.    For patients admitted to the hospital, discharge time will be determined by your treatment team.   Patients discharged the day of surgery will not be allowed to drive home, and  someone needs to stay with them for 24 hours.  SURGICAL WAITING ROOM VISITATION Patients having surgery or a procedure may have no more than 2 support people in the waiting area - these visitors may rotate.   Children under the age of 75 must have an adult with them who is not the patient. If the patient needs to stay at the hospital during part of their recovery, the visitor guidelines for inpatient rooms apply. Pre-op nurse will coordinate an appropriate time for 1 support person to accompany patient in pre-op.  This support person may not rotate.   Please refer to the [facility]  website for the visitor guidelines for Inpatients (after your surgery is over and you are in a regular room).    Special instructions:   [place] [facility]- Preparing For Surgery  Before surgery, you can play an important role. Because skin is not sterile, your skin needs to be as free of germs as possible. You can reduce the number of germs on your skin by washing with CHG (chlorahexidine gluconate) Soap before surgery.  CHG is an antiseptic cleaner which kills germs and bonds with the skin to continue killing germs even after washing.    Oral Hygiene is also important to reduce your risk of infection.  Remember - BRUSH YOUR TEETH THE MORNING OF SURGERY WITH YOUR REGULAR TOOTHPASTE  Please do not use if you have an allergy to CHG or antibacterial soaps. If your skin becomes reddened/irritated stop using the CHG.  Do not shave (including legs and underarms) for at least 48 hours prior to first CHG shower. It is OK to shave your face.  Please follow these instructions carefully.   Shower the NIGHT BEFORE SURGERY and the MORNING OF SURGERY  If you chose to wash your hair, wash your hair first as usual with your normal shampoo.  After you shampoo, rinse your hair and body thoroughly to remove the shampoo.  Use CHG Soap as you would any other liquid soap. You can apply CHG directly to the skin and wash gently with a scrungie or a clean washcloth.   Apply the CHG Soap to your body ONLY FROM THE NECK DOWN.  Do not use on open wounds or open sores. Avoid contact with your eyes, ears, mouth and genitals (private parts). Wash Face and genitals (private parts)  with your normal soap.   Wash thoroughly, paying special attention to the area where your surgery will be performed.  Thoroughly rinse your body with warm water from the neck down.  DO NOT shower/wash with your normal soap after using and rinsing off the CHG Soap.  Pat yourself dry with a CLEAN TOWEL.  Wear CLEAN PAJAMAS to bed the night  before surgery  Place CLEAN SHEETS on your bed the night before your surgery  DO NOT SLEEP WITH PETS.   Day of Surgery: Take a shower with CHG soap. Do not wear jewelry or makeup Do not wear lotions, powders, perfumes/colognes, or deodorant. Do not shave 48 hours prior to surgery.  Men may shave face and neck. Do not bring valuables to the hospital.  [facility] is not responsible for any belongings or valuables. Do not wear nail polish, gel polish, artificial nails, or any other type of covering on natural nails (fingers and toes) If you have artificial nails or gel coating that need to be removed by a nail salon, please have this removed prior to surgery. Artificial nails or gel coating may interfere with anesthesia's ability  to adequately monitor your vital signs.  Wear Clean/Comfortable clothing the morning of surgery Remember to brush your teeth WITH YOUR REGULAR TOOTHPASTE.   Please read over the following fact sheets that you were given.    If you received a COVID test during your pre-op visit  it is requested that you wear a mask when out in public, stay away from anyone that may not be feeling well and notify your surgeon if you develop symptoms. If you have been in contact with anyone that has tested positive in the last 10 days please notify you surgeon.

## 2022-07-10 ENCOUNTER — Encounter (HOSPITAL_COMMUNITY)
Admission: RE | Admit: 2022-07-10 | Discharge: 2022-07-10 | Disposition: A | Discharge status: Home / Self Care | Payer: Medicare Other | Source: Ambulatory Visit | Attending: Cardiovascular Disease | Admitting: Cardiovascular Disease

## 2022-07-10 ENCOUNTER — Encounter (HOSPITAL_COMMUNITY): Payer: Self-pay

## 2022-07-10 ENCOUNTER — Other Ambulatory Visit: Payer: Self-pay

## 2022-07-10 ENCOUNTER — Ambulatory Visit (HOSPITAL_COMMUNITY)
Admission: RE | Admit: 2022-07-10 | Discharge: 2022-07-10 | Disposition: A | Discharge status: Home / Self Care | Payer: Medicare Other | Source: Ambulatory Visit | Attending: Cardiovascular Disease | Admitting: Cardiovascular Disease

## 2022-07-10 VITALS — BP 113/93 | HR 60 | Temp 98.0°F | Resp 18 | Ht 71.0 in | Wt 230.4 lb

## 2022-07-10 DIAGNOSIS — Z01818 Encounter for other preprocedural examination: Secondary | ICD-10-CM | POA: Diagnosis not present

## 2022-07-10 DIAGNOSIS — E119 Type 2 diabetes mellitus without complications: Secondary | ICD-10-CM | POA: Insufficient documentation

## 2022-07-10 DIAGNOSIS — Z794 Long term (current) use of insulin: Secondary | ICD-10-CM | POA: Diagnosis not present

## 2022-07-10 DIAGNOSIS — Z20822 Contact with and (suspected) exposure to covid-19: Secondary | ICD-10-CM | POA: Diagnosis not present

## 2022-07-10 DIAGNOSIS — J9811 Atelectasis: Secondary | ICD-10-CM | POA: Diagnosis not present

## 2022-07-10 DIAGNOSIS — I350 Nonrheumatic aortic (valve) stenosis: Secondary | ICD-10-CM | POA: Insufficient documentation

## 2022-07-10 HISTORY — DX: Cardiac murmur, unspecified: R01.1

## 2022-07-10 LAB — URINALYSIS, ROUTINE W REFLEX MICROSCOPIC
Bacteria, UA: NONE SEEN
Bilirubin Urine: NEGATIVE
Glucose, UA: >=500 mg/dL — AB
Hgb urine dipstick: NEGATIVE
Ketones, ur: NEGATIVE mg/dL
Leukocytes,Ua: NEGATIVE
Nitrite: NEGATIVE
Protein, ur: 30 mg/dL — AB
Specific Gravity, Urine: 1.015 (ref 1.005–1.030)
pH: 7 (ref 5.0–8.0)

## 2022-07-10 LAB — PROTIME-INR
INR: 1 (ref 0.8–1.2)
Prothrombin Time: 13.3 seconds (ref 11.4–15.2)

## 2022-07-10 LAB — CBC
HCT: 46.5 % (ref 39.0–52.0)
Hemoglobin: 15.1 g/dL (ref 13.0–17.0)
MCH: 29.8 pg (ref 26.0–34.0)
MCHC: 32.5 g/dL (ref 30.0–36.0)
MCV: 91.9 fL (ref 80.0–100.0)
Platelets: 236 10*3/uL (ref 150–400)
RBC: 5.06 MIL/uL (ref 4.22–5.81)
RDW: 14 % (ref 11.5–15.5)
WBC: 8.7 10*3/uL (ref 4.0–10.5)
nRBC: 0 % (ref 0.0–0.2)

## 2022-07-10 LAB — COMPREHENSIVE METABOLIC PANEL
ALT: 16 U/L (ref 0–44)
AST: 18 U/L (ref 15–41)
Albumin: 3.7 g/dL (ref 3.5–5.0)
Alkaline Phosphatase: 70 U/L (ref 38–126)
Anion gap: 5 (ref 5–15)
BUN: 21 mg/dL (ref 8–23)
CO2: 29 mmol/L (ref 22–32)
Calcium: 9.4 mg/dL (ref 8.9–10.3)
Chloride: 104 mmol/L (ref 98–111)
Creatinine, Ser: 1.44 mg/dL — ABNORMAL HIGH (ref 0.61–1.24)
GFR, Estimated: 48 mL/min — ABNORMAL LOW (ref >60)
Glucose, Bld: 81 mg/dL (ref 70–99)
Potassium: 4.1 mmol/L (ref 3.5–5.1)
Sodium: 138 mmol/L (ref 135–145)
Total Bilirubin: 0.7 mg/dL (ref 0.3–1.2)
Total Protein: 6.5 g/dL (ref 6.5–8.1)

## 2022-07-10 LAB — TYPE AND SCREEN
ABO/RH(D): O POS
Antibody Screen: NEGATIVE

## 2022-07-10 LAB — GLUCOSE, CAPILLARY: Glucose-Capillary: 97 mg/dL (ref 70–99)

## 2022-07-10 LAB — HEMOGLOBIN A1C
Hgb A1c MFr Bld: 7.2 % — ABNORMAL HIGH (ref 4.8–5.6)
Mean Plasma Glucose: 159.94 mg/dL

## 2022-07-10 LAB — SURGICAL PCR SCREEN
MRSA, PCR: NEGATIVE
Staphylococcus aureus: NEGATIVE

## 2022-07-10 NOTE — Progress Notes (Addendum)
PCP - Dr. [name] Cardiologist - Dr. Burt Knack  PPM/ICD - Denies Device Orders - n/a Rep Notified - n/a  Chest x-ray - 07/10/2022 EKG - 07/10/2022 Stress Test - Denies ECHO - 04/14/2022 Cardiac Cath - 05/07/2022  Sleep Study - denies CPAP - n/a  Pt is DM Type 2. Fasting glucose 150-160s. CBG 97 at PAT appointment. Pt has CBM on left arm and checks glucose multiple times throughout the day.  Blood Thinner Instructions: n/a Aspirin Instructions: Stop taking asa today  NPO after midnight  COVID TEST- Yes. Done at PAT appointment. Result pending.   Anesthesia review: Yes. Cardiac History. Abnormal EKG.  Patient denies shortness of breath, fever, cough and chest pain at PAT appointment   All instructions explained to the patient, with a verbal understanding of the material. Patient agrees to go over the instructions while at home for a better understanding. Patient also instructed to self quarantine after being tested for COVID-19. The opportunity to ask questions was provided.

## 2022-07-11 LAB — SARS CORONAVIRUS 2 (TAT 6-24 HRS): SARS Coronavirus 2: NEGATIVE

## 2022-07-13 ENCOUNTER — Ambulatory Visit: Payer: Self-pay | Admitting: Licensed Clinical Social Worker

## 2022-07-13 MED ORDER — POTASSIUM CHLORIDE 2 MEQ/ML IV SOLN
80.0000 meq | INTRAVENOUS | Status: DC
  Filled 2022-07-13 (×2): qty 40

## 2022-07-13 MED ORDER — CEFAZOLIN SODIUM-DEXTROSE 2-4 GM/100ML-% IV SOLN
2.0000 g | INTRAVENOUS | Status: AC
  Administered 2022-07-14: 2 g via INTRAVENOUS
  Filled 2022-07-13: qty 100

## 2022-07-13 MED ORDER — DEXMEDETOMIDINE HCL IN NACL 400 MCG/100ML IV SOLN
0.1000 ug/kg/h | INTRAVENOUS | Status: DC
  Filled 2022-07-13 (×2): qty 100

## 2022-07-13 MED ORDER — HEPARIN 30,000 UNITS/1000 ML (OHS) CELLSAVER SOLUTION
Status: DC
  Filled 2022-07-13 (×2): qty 1000

## 2022-07-13 MED ORDER — MAGNESIUM SULFATE 50 % IJ SOLN
40.0000 meq | INTRAMUSCULAR | Status: DC
  Filled 2022-07-13 (×2): qty 9.85

## 2022-07-13 MED ORDER — NOREPINEPHRINE 4 MG/250ML-% IV SOLN
0.0000 ug/min | INTRAVENOUS | Status: AC
  Administered 2022-07-14: 3 ug/min via INTRAVENOUS
  Filled 2022-07-13: qty 250

## 2022-07-13 NOTE — Patient Instructions (Addendum)
Visit Information  Thank you for taking time to visit with me today. Please don't hesitate to contact me if I can be of assistance to you before our next scheduled telephone appointment.  Following are the goals we discussed today:   Our next appointment is by telephone on 07/22/22 at 1;00 PM   Please call the care guide team at [phone] if you need to cancel or reschedule your appointment.   If you are experiencing a Mental Health or [place] or need someone to talk to, please go to [facility] [phone])   Following is a copy of your full plan of care:                                          Care Coordination Interventions:   Active listening / Reflection utilized   LCSW described Care Coordination program to client Client asked for call back from LCSW at another time.  Mr. Gruenewald was given information about Care Management services by the embedded care coordination team including:  Care Management services include personalized support from designated clinical staff supervised by his physician, including individualized plan of care and coordination with other care providers 24/7 contact phone numbers for assistance for urgent and routine care needs. The patient may stop CCM services at any time (effective at the end of the month) by phone call to the office staff.  Patient agreed to services and verbal consent obtained.   Norva Riffle.Arma Reining MSW, [place] [employer] representative [facility] Care Management [phone]

## 2022-07-13 NOTE — H&P (Signed)
[address]       [place] [address]             [phone]      Cardiothoracic Surgery Admission History and Physical   PCP is Janith Lima, MD Referring Provider is Sherren Mocha, MD Primary Cardiologist is Sherren Mocha, MD   Reason for admission:  Severe aortic stenosis   HPI:   The patient is an 81 year old gentleman with a history of type 2 diabetes, hypertension, hyperlipidemia, stage III chronic kidney disease, and aortic stenosis that has been followed by Dr. Burt Knack.  His most recent echo shows an increase in the mean gradient is 51 mmHg consistent with severe aortic stenosis.  There is mild aortic insufficiency.  Left ventricular wall may have been noticed with greater than 75%.   He lives with his wife.  He reports progressive symptoms of exertional fatigue and shortness of breath at low level activity.  He has had a few episodes of dizziness with activity and bending over.  He denies any chest pain or pressure.  He denies peripheral edema.         Past Medical History:  Diagnosis Date   Diabetes mellitus      type 2   Hyperlipidemia     Hypertension     Hypogonadism, male     Pancreatitis 11/30/2006   Renal insufficiency     Severe aortic stenosis             Past Surgical History:  Procedure Laterality Date   ALVEOLOPLASTY N/A 05/14/2022    Procedure: ALVEOLOPLASTY;  Surgeon: Charlaine Dalton, DMD;  Location: [facility];  Service: Dentistry;  Laterality: N/A;   CARDIAC CATHETERIZATION       EYE SURGERY        cataracts   FLEXIBLE SIGMOIDOSCOPY N/A 09/10/2017    Procedure: FLEXIBLE SIGMOIDOSCOPY;  Surgeon: Mauri Pole, MD;  Location: [facility];  Service: Endoscopy;  Laterality: N/A;   IR KYPHO LUMBAR INC FX REDUCE BONE BX UNI/BIL CANNULATION INC/IMAGING   09/14/2017   IR RADIOLOGIST EVAL & MGMT   10/07/2017   RIGHT HEART CATH AND CORONARY ANGIOGRAPHY N/A 05/07/2022    Procedure: RIGHT HEART CATH AND CORONARY  ANGIOGRAPHY;  Surgeon: Sherren Mocha, MD;  Location: [facility];  Service: Cardiovascular;  Laterality: N/A;   TONSILLECTOMY       TOOTH EXTRACTION N/A 05/14/2022    Procedure: DENTAL EXTRACTIONS TEETH NUMBER TWO, THREE, FOUR, FIVE, FOURTEEN, FIFTEEN, TWENTY;  Surgeon: Charlaine Dalton, DMD;  Location: [facility];  Service: Dentistry;  Laterality: N/A;           Family History  Problem Relation Age of Onset   Hypertension Other        Social History         Socioeconomic History   Marital status: Married      Spouse name: Not on file   Number of children: Not on file   Years of education: Not on file   Highest education level: Not on file  Occupational History   Occupation: retired  Tobacco Use   Smoking status: Former      Types: Cigarettes      Quit date: 11/30/1974      Years since quitting: 47.6      Passive exposure: Never   Smokeless tobacco: Never  Vaping Use   Vaping Use: Never used  Substance and Sexual Activity   Alcohol use:  Not Currently      Alcohol/week: 0.0 standard drinks of alcohol   Drug use: No   Sexual activity: Yes  Other Topics Concern   Not on file  Social History Narrative    Regular exercise-yes    Social Determinants of Health        Financial Resource Strain: Low Risk  (03/02/2022)    Overall Financial Resource Strain (CARDIA)     Difficulty of Paying Living Expenses: Not hard at all  Food Insecurity: No Food Insecurity (03/02/2022)    Hunger Vital Sign     Worried About Running Out of Food in the Last Year: Never true     Ran Out of Food in the Last Year: Never true  Transportation Needs: No Transportation Needs (03/02/2022)    PRAPARE - Armed forces logistics/support/administrative officer (Medical): No     Lack of Transportation (Non-Medical): No  Physical Activity: Insufficiently Active (03/02/2022)    Exercise Vital Sign     Days of Exercise per Week: 2 days     Minutes of Exercise per Session: 20 min  Stress: No Stress Concern Present  (03/02/2022)    [place]     Feeling of Stress : Not at all  Social Connections: Moderately Isolated (03/02/2022)    Social Connection and Isolation Panel [NHANES]     Frequency of Communication with Friends and Family: Twice a week     Frequency of Social Gatherings with Friends and Family: Twice a week     Attends Religious Services: Never     Marine scientist or Organizations: No     Attends Archivist Meetings: Never     Marital Status: Married  Human resources officer Violence: Not At Risk (03/02/2022)    Humiliation, Afraid, Rape, and Kick questionnaire     Fear of Current or Ex-Partner: No     Emotionally Abused: No     Physically Abused: No     Sexually Abused: No             Prior to Admission medications   Medication Sig Start Date End Date Taking? Authorizing Provider  atenolol-chlorthalidone (TENORETIC) 100-25 MG tablet TAKE ONE TABLET BY MOUTH ONCE DAILY 02/13/22   Yes Janith Lima, MD  Continuous Blood Gluc Receiver (FREESTYLE LIBRE 2 READER) DEVI 1 Act by Does not apply route daily. 10/10/20   Yes Janith Lima, MD  Continuous Blood Gluc Sensor (FREESTYLE LIBRE 2 SENSOR) MISC USE AS DIRECTED AND REPLACE EVERY 14 DAYS. 03/10/22   Yes Janith Lima, MD  DULoxetine (CYMBALTA) 60 MG capsule TAKE ONE CAPSULE DAILY 04/28/22   Yes Janith Lima, MD  Insulin Glargine-Lixisenatide (SOLIQUA) 100-33 UNT-MCG/ML SOPN Inject 60 Units into the skin daily. 05/29/22   Yes Janith Lima, MD  Insulin Pen Needle (NOVOFINE) 32G X 6 MM MISC 1 Act by Does not apply route daily. 06/04/20   Yes Janith Lima, MD  JARDIANCE 25 MG TABS tablet TAKE ONE TABLET ONCE DAILY BEFORE BREAKFAST. 04/03/22   Yes Janith Lima, MD  KERENDIA 20 MG TABS Take 1 tablet by mouth daily. 05/10/22   Yes Janith Lima, MD  losartan (COZAAR) 100 MG tablet Take 1 tablet (100 mg total) by mouth daily. for high blood pressure 03/11/22   Yes  Janith Lima, MD  Multiple Vitamin (MULTIVITAMIN WITH MINERALS) TABS tablet Take 1 tablet by mouth 2 (two) times  a week.     Yes [provider]  Multiple Vitamins-Minerals (PRESERVISION AREDS PO) Take 1 tablet by mouth in the morning and at bedtime.     Yes [provider]  MYRBETRIQ 50 MG TB24 tablet TAKE 1 TABLET ONCE DAILY. 07/17/20   Yes Janith Lima, MD  pravastatin (PRAVACHOL) 40 MG tablet TAKE ONE TABLET BY MOUTH ONCE DAILY. 02/13/22   Yes Janith Lima, MD  pregabalin (LYRICA) 150 MG capsule Take 150 mg by mouth 2 (two) times daily. 03/26/20   Yes [provider]  tamsulosin (FLOMAX) 0.4 MG CAPS capsule TAKE ONE CAPSULE BY MOUTH DAILY 06/18/22   Yes Janith Lima, MD  vitamin B-12 (CYANOCOBALAMIN) 500 MCG tablet Take 500 mcg by mouth daily.     Yes [provider]  glucagon (GLUCAGEN HYPOKIT) 1 MG SOLR injection Inject 1 mg into the vein once as needed for up to 1 dose for low blood sugar. Patient not taking: Reported on 04/30/2022 01/27/21     Janith Lima, MD            Current Outpatient Medications  Medication Sig Dispense Refill   atenolol-chlorthalidone (TENORETIC) 100-25 MG tablet TAKE ONE TABLET BY MOUTH ONCE DAILY 90 tablet 1   Continuous Blood Gluc Receiver (FREESTYLE LIBRE 2 READER) DEVI 1 Act by Does not apply route daily. 2 each 5   Continuous Blood Gluc Sensor (FREESTYLE LIBRE 2 SENSOR) MISC USE AS DIRECTED AND REPLACE EVERY 14 DAYS. 2 each 5   DULoxetine (CYMBALTA) 60 MG capsule TAKE ONE CAPSULE DAILY 90 capsule 1   Insulin Glargine-Lixisenatide (SOLIQUA) 100-33 UNT-MCG/ML SOPN Inject 60 Units into the skin daily. 54 mL 1   Insulin Pen Needle (NOVOFINE) 32G X 6 MM MISC 1 Act by Does not apply route daily. 100 each 1   JARDIANCE 25 MG TABS tablet TAKE ONE TABLET ONCE DAILY BEFORE BREAKFAST. 90 tablet 1   KERENDIA 20 MG TABS Take 1 tablet by mouth daily. 90 tablet 0   losartan (COZAAR) 100 MG tablet Take 1 tablet (100 mg total) by  mouth daily. for high blood pressure 90 tablet 1   Multiple Vitamin (MULTIVITAMIN WITH MINERALS) TABS tablet Take 1 tablet by mouth 2 (two) times a week.       Multiple Vitamins-Minerals (PRESERVISION AREDS PO) Take 1 tablet by mouth in the morning and at bedtime.       MYRBETRIQ 50 MG TB24 tablet TAKE 1 TABLET ONCE DAILY. 90 tablet 1   pravastatin (PRAVACHOL) 40 MG tablet TAKE ONE TABLET BY MOUTH ONCE DAILY. 90 tablet 1   pregabalin (LYRICA) 150 MG capsule Take 150 mg by mouth 2 (two) times daily.       tamsulosin (FLOMAX) 0.4 MG CAPS capsule TAKE ONE CAPSULE BY MOUTH DAILY 90 capsule 1   vitamin B-12 (CYANOCOBALAMIN) 500 MCG tablet Take 500 mcg by mouth daily.       glucagon (GLUCAGEN HYPOKIT) 1 MG SOLR injection Inject 1 mg into the vein once as needed for up to 1 dose for low blood sugar. (Patient not taking: Reported on 04/30/2022) 2 each 2    No current facility-administered medications for this visit.           Allergies  Allergen Reactions   Farxiga [Dapagliflozin] Other (See Comments)      gout   Metformin And Related Other (See Comments)      acidosis   Atorvastatin Other (See Comments)  REACTION: h/a   Benazepril Hcl Other (See Comments)      Was not effective   Flexeril [Cyclobenzaprine] Other (See Comments)      HALLUSINATIONS   Oxycodone Other (See Comments)      HALLUSINATIONS          Review of Systems:               General:                      normal appetite, + decreased energy, no weight gain, no weight loss, no fever             Cardiac:                       no chest pain with exertion, no chest pain at rest, +SOB with mild exertion, no resting SOB, no PND, no orthopnea, no palpitations, no arrhythmia, no atrial fibrillation, no LE edema, + dizzy spells, no syncope             Respiratory:                 + exertional shortness of breath, no home oxygen, no productive cough, no dry cough, no bronchitis, no wheezing, no hemoptysis, on asthma, no pain with  inspiration or cough, no sleep apnea, no CPAP at night             GI:                               no difficulty swallowing, no reflux, no frequent heartburn, no hiatal hernia, no abdominal pain, no constipation, no diarrhea, no hematochezia, no hematemesis, no melena             GU:                              no dysuria,  no frequency, no urinary tract infection, no hematuria, no enlarged prostate, no kidney stones, no kidney disease             Vascular:                     no pain suggestive of claudication, no pain in feet, no leg cramps, no varicose veins, no DVT, no non-healing foot ulcer             Neuro:                         no stroke, no TIA's, no seizures, no headaches, no temporary blindness one eye,  no slurred speech, no peripheral neuropathy, no chronic pain, + instability of gait, no memory/cognitive dysfunction             Musculoskeletal:         no arthritis, no joint swelling, no myalgias, + difficulty walking, + reduced mobility              Skin:                            no rash, no itching, no skin infections, no pressure sores or ulcerations             Psych:  no anxiety, no depression, no nervousness, no unusual recent stress             Eyes:                           no blurry vision, no floaters, no recent vision changes, + wears glasses              ENT:                            no hearing loss, no loose or painful teeth, no dentures, last saw dentist in June for extraction of 7 teeth (Dr. Benson Norway)             Hematologic:               no easy bruising, no abnormal bleeding, no clotting disorder, no frequent epistaxis             Endocrine:                   + diabetes, does check CBG's at home                            Physical Exam:               BP (!) 100/48 (BP Location: Left Arm, Patient Position: Sitting)   Pulse 63   Resp 18   Ht '5\' 11"'$  (1.803 m)   Wt 233 lb (105.7 kg)   SpO2 91% Comment: RA  BMI 32.50 kg/m               General:                      well-appearing             HEENT:                       Unremarkable, NCAT, PERLA, EOMI             Neck:                           no JVD, no bruits, no adenopathy              Chest:                          clear to auscultation, symmetrical breath sounds, no wheezes, no rhonchi              CV:                              RRR, 3/6 systolic murmur RSB, no diastolic murmur             Abdomen:                    soft, non-tender, no masses              Extremities:                 warm, well-perfused, pulses palpable at ankles, no lower extremity edema             Rectal/GU  Deferred             Neuro:                         Grossly non-focal and symmetrical throughout             Skin:                            Clean and dry, no rashes, no breakdown   Diagnostic Tests:   ECHOCARDIOGRAM REPORT         Patient Name:   MAURIE OLESEN Maslanka Date of Exam: 04/14/2022  Medical Rec #:  [id number]       Height:       71.0 in  Accession #:    [id number]      Weight:       232.0 lb  Date of Birth:  1941-02-06       BSA:          2.246 m  Patient Age:    77 years        BP:           132/80 mmHg  Patient Gender: M               HR:           52 bpm.  Exam Location:  [facility]   Procedure: 2D Echo, Cardiac Doppler, Color Doppler and Intracardiac             Opacification Agent   Indications:    I35 Aortic stenosis     History:        Patient has prior history of Echocardiogram examinations,  most                  recent 10/15/2021. PVD, Aortic Valve Disease; Risk                  Factors:Hypertension, Diabetes, Dyslipidemia and Former  Smoker.                  CKD stage 3. Morbid obesity.     Sonographer:    Basilia Jumbo BS, RDCS  Referring Phys: [place]     1. Left ventricular ejection fraction, by estimation, is >75%. The left  ventricle has hyperdynamic function. The left ventricle has no regional  wall  motion abnormalities. There is moderate asymmetric left ventricular  hypertrophy of the basal-septal  segment. Left ventricular diastolic parameters are consistent with Grade I  diastolic dysfunction (impaired relaxation).   2. Right ventricular systolic function is normal. The right ventricular  size is normal. Tricuspid regurgitation signal is inadequate for assessing  PA pressure.   3. Left atrial size was mildly dilated.   4. Right atrial size was mildly dilated.   5. The mitral valve is abnormal. Trivial mitral valve regurgitation.   6. The aortic valve has an indeterminant number of cusps. There is severe  calcifcation of the aortic valve. Aortic valve regurgitation is mild.  Severe aortic valve stenosis. Aortic valve area, by VTI measures 0.97 cm.  Aortic valve mean gradient  measures 51.0 mmHg. Aortic valve Vmax measures 4.70 m/s. Peak gradient  88.4 mmHg. Based on an LVOT diameter of 2.1 cm. DI is 0.28.   7. Aortic dilatation noted. There is borderline dilatation of the aortic  root, measuring 39 mm. There is mild dilatation  of the ascending aorta,  measuring 41 mm.   8. The inferior vena cava is normal in size with <50% respiratory  variability, suggesting right atrial pressure of 8 mmHg.   Comparison(s): 10/15/21 EF 70-75%. Severe AS 79mHg mean PG, 818mg peak  PG. Mild AI.   FINDINGS   Left Ventricle: Left ventricular ejection fraction, by estimation, is  >75%. The left ventricle has hyperdynamic function. The left ventricle has  no regional wall motion abnormalities. The left ventricular internal  cavity size was small. There is  moderate asymmetric left ventricular hypertrophy of the basal-septal  segment. Left ventricular diastolic parameters are consistent with Grade I  diastolic dysfunction (impaired relaxation). Indeterminate filling  pressures.   Right Ventricle: The right ventricular size is normal. No increase in  right ventricular wall thickness. Right  ventricular systolic function is  normal. Tricuspid regurgitation signal is inadequate for assessing PA  pressure.   Left Atrium: Left atrial size was mildly dilated.   Right Atrium: Right atrial size was mildly dilated.   Pericardium: There is no evidence of pericardial effusion.   Mitral Valve: The mitral valve is abnormal. There is mild calcification of  the posterior and anterior mitral valve leaflet(s). Trivial mitral valve  regurgitation.   Tricuspid Valve: The tricuspid valve is grossly normal. Tricuspid valve  regurgitation is trivial.   Aortic Valve: The aortic valve has an indeterminant number of cusps. There  is severe calcifcation of the aortic valve. Aortic valve regurgitation is  mild. Severe aortic stenosis is present. Aortic valve mean gradient  measures 51.0 mmHg. Aortic valve peak   gradient measures 88.4 mmHg. Aortic valve area, by VTI measures 0.97 cm.   Pulmonic Valve: The pulmonic valve was normal in structure. Pulmonic valve  regurgitation is not visualized.   Aorta: Aortic dilatation noted. There is borderline dilatation of the  aortic root, measuring 39 mm. There is mild dilatation of the ascending  aorta, measuring 41 mm.   Venous: The inferior vena cava is normal in size with less than 50%  respiratory variability, suggesting right atrial pressure of 8 mmHg.   IAS/Shunts: No atrial level shunt detected by color flow Doppler.      LEFT VENTRICLE  PLAX 2D  LVIDd:         5.20 cm   Diastology  LVIDs:         2.60 cm   LV e' medial:    6.08 cm/s  LV PW:         1.10 cm   LV E/e' medial:  7.4  LV IVS:        1.50 cm   LV e' lateral:   3.73 cm/s  LVOT diam:     2.10 cm   LV E/e' lateral: 12.1  LV SV:         114  LV SV Index:   51  LVOT Area:     3.46 cm      RIGHT VENTRICLE             IVC  RV Basal diam:  4.00 cm     IVC diam: 2.00 cm  RV S prime:     11.70 cm/s  TAPSE (M-mode): 2.5 cm   LEFT ATRIUM             Index        RIGHT ATRIUM            Index  LA diam:  4.20 cm 1.87 cm/m   RA Pressure: 8.00 mmHg  LA Vol (A2C):   88.5 ml 39.41 ml/m  RA Area:     25.80 cm  LA Vol (A4C):   70.5 ml 31.39 ml/m  RA Volume:   83.40 ml  37.14 ml/m  LA Biplane Vol: 81.8 ml 36.42 ml/m   AORTIC VALVE  AV Area (Vmax):    0.91 cm  AV Area (Vmean):   0.92 cm  AV Area (VTI):     0.97 cm  AV Vmax:           470.00 cm/s  AV Vmean:          334.000 cm/s  AV VTI:            1.180 m  AV Peak Grad:      88.4 mmHg  AV Mean Grad:      51.0 mmHg  LVOT Vmax:         124.00 cm/s  LVOT Vmean:        88.800 cm/s  LVOT VTI:          0.330 m  LVOT/AV VTI ratio: 0.28     AORTA  Ao Root diam: 3.90 cm  Ao Asc diam:  4.10 cm   MITRAL VALVE               TRICUSPID VALVE                             Estimated RAP:  8.00 mmHg  MV Decel Time: 359 msec  MV E velocity: 45.20 cm/s  SHUNTS  MV A velocity: 82.30 cm/s  Systemic VTI:  0.33 m  MV E/A ratio:  0.55        Systemic Diam: 2.10 cm   Lyman Bishop MD  Electronically signed by Lyman Bishop MD  Signature Date/Time: 04/14/2022/1:53:29 PM         Final       Physicians   Panel Physicians Referring Physician Case Authorizing Physician  Sherren Mocha, MD (Primary)        Procedures   RIGHT HEART CATH AND CORONARY ANGIOGRAPHY    Conclusion       There is severe aortic valve stenosis.   1.  Widely patent coronary arteries with no significant stenoses 2.  Known severe aortic stenosis with heavy calcification and leaflet restriction seen on plain fluoroscopy 3.  Normal right heart pressures and hemodynamics.   PLAN: Continue TAVR evaluation   Indications   Severe aortic stenosis [I35.0 (ICD-10-CM)]    Procedural Details   Technical Details INDICATION: Severe aortic stenosis.  Pre-TAVR evaluation.  PROCEDURAL DETAILS: There was an indwelling IV in a right antecubital vein. Using normal sterile technique, the IV was changed out for a 5 Fr brachial sheath over a 0.018 inch  wire. The right wrist was then prepped, draped, and anesthetized with 1% lidocaine. Using the modified Seldinger technique a 5/6 French Slender sheath was placed in the right radial artery. Intra-arterial verapamil was administered through the radial artery sheath. IV heparin was administered after a JR4 catheter was advanced into the central aorta. A Swan-Ganz catheter was used for the right heart catheterization. Standard protocol was followed for recording of right heart pressures and sampling of oxygen saturations. Fick cardiac output was calculated. Standard Judkins catheters were used for selective coronary angiography.  The aortic valve is known to be severely stenosed.  It is heavily calcified  and restricted by plain fluoroscopy.  No attempt was made to cross the valve today.  There were no immediate procedural complications. The patient was transferred to the post catheterization recovery area for further monitoring.      Estimated blood loss <50 mL.   During this procedure medications were administered to achieve and maintain moderate conscious sedation while the patient's heart rate, blood pressure, and oxygen saturation were continuously monitored and I was present face-to-face 100% of this time.    Medications (Filter: Administrations occurring from 1219 to 1320 on 05/07/22)  important  Continuous medications are totaled by the amount administered until 05/07/22 1320.    midazolam (VERSED) injection (mg) Total dose:  2 mg  Date/Time Rate/Dose/Volume Action    05/07/22 1240 2 mg Given      Heparin (Porcine) in NaCl 1000-0.9 UT/500ML-% SOLN (mL) Total volume:  1,000 mL  Date/Time Rate/Dose/Volume Action    05/07/22 1248 500 mL Given    1248 500 mL Given      lidocaine (PF) (XYLOCAINE) 1 % injection (mL) Total volume:  4 mL  Date/Time Rate/Dose/Volume Action    05/07/22 1252 2 mL Given    1253 2 mL Given      Radial Cocktail/Verapamil only (mL) Total volume:  10  mL  Date/Time Rate/Dose/Volume Action    05/07/22 1255 10 mL Given      heparin sodium (porcine) injection (Units) Total dose:  5,000 Units  Date/Time Rate/Dose/Volume Action    05/07/22 1302 5,000 Units Given      iohexol (OMNIPAQUE) 350 MG/ML injection (mL) Total volume:  45 mL  Date/Time Rate/Dose/Volume Action    05/07/22 1316 45 mL Given      Contrast   Medication Name Total Dose  iohexol (OMNIPAQUE) 350 MG/ML injection 45 mL    Radiation/Fluoro   Fluoro time: 3.3 (min) DAP: 77412 (mGycm2) Cumulative Air Kerma: 878 (mGy) Complications      Complications documented before study signed (05/07/2022  6:76 PM)     No complications were associated with this study.  Documented by Bethann Punches, RN - 05/07/2022  1:14 PM      Coronary Findings   Diagnostic Dominance: Right Left Main  Vessel is large. The vessel exhibits minimal luminal irregularities.    Left Anterior Descending  The LAD reaches the apex with no significant stenoses throughout. The vessel has minimal irregularity.    Left Circumflex  Vessel is moderate in size. The circumflex is patent. There is no significant stenosis throughout. The OM branches are patent.    Right Coronary Artery  Vessel is large. The vessel exhibits minimal luminal irregularities. Large, dominant vessel. The PDA and PLA branches are both large. The vessel is patent with minimal irregularity but no significant stenosis throughout.    Intervention    No interventions have been documented.    Left Heart   Aortic Valve There is severe aortic valve stenosis. The aortic valve is calcified. There is restricted aortic valve motion.    Coronary Diagrams   Diagnostic Dominance: Right  Intervention   Implants      No implant documentation for this case.    Syngo Images    Show images for CARDIAC CATHETERIZATION Images on Long Term Storage    Show images for Aday, SRINIVAS LIPPMAN to Procedure Log   Procedure Log     Hemo Data   Flowsheet Row Most Recent Value  Fick Cardiac Output 6.82 L/min  Fick Cardiac Output Index 3.04 (L/min)/BSA  RA A Wave 8 mmHg  RA V Wave 4 mmHg  RA Mean 4 mmHg  RV Systolic Pressure 32 mmHg  RV Diastolic Pressure 1 mmHg  RV EDP 7 mmHg  PA Systolic Pressure 32 mmHg  PA Diastolic Pressure 10 mmHg  PA Mean 22 mmHg  PW A Wave 17 mmHg  PW V Wave 14 mmHg  PW Mean 12 mmHg  AO Systolic Pressure 607 mmHg  AO Diastolic Pressure 74 mmHg  AO Mean 88 mmHg  QP/QS 1  TPVR Index 7.24 HRUI  TSVR Index 28.97 HRUI  PVR SVR Ratio 0.12  TPVR/TSVR Ratio 0.25      ADDENDUM REPORT: 05/20/2022 09:03   CLINICAL DATA:  Severe Aortic Stenosis.   EXAM: Cardiac TAVR CT   TECHNIQUE: A non-contrast, gated CT scan was obtained with axial slices of 3 mm through the heart for aortic valve calcium scoring. A 120 kV retrospective, gated, contrast cardiac scan was obtained. Gantry rotation speed was 250 msecs and collimation was 0.6 mm. Nitroglycerin was not given. The 3D data set was reconstructed in 5% intervals of the 0-95% of the R-R cycle. Systolic and diastolic phases were analyzed on a dedicated workstation using MPR, MIP, and VRT modes. The patient received 100 cc of contrast.   FINDINGS: Image quality: Excellent.   Noise artifact is: Limited.   Valve Morphology: Tricuspid aortic valve with severe calcifications. The Trion contains bulky calcifications and this leaflet is immobile. The calcification extends into the commissure between the NCC/LCC. Calcifications of the [place] and LCC extend into the annulus and LVOT.   Aortic Valve Calcium score: 6313   Aortic annular dimension:   Phase assessed: 25%   Annular area: 532 mm2   Annular perimeter: 82.8 mm   Max diameter: 28.7 mm   Min diameter: 24.8 mm   Annular and subannular calcification: Mild annular calcifications under the [place] and LCC.   Membranous septum length: 15.0 mm   Optimal coplanar projection: LAO 14 CAU  16   Coronary Artery Height above Annulus:   Left Main: 15.1 mm   Right Coronary: 18.5 mm   Sinus of Valsalva Measurements:   Non-coronary: 35 mm   Right-coronary: 37 mm   Left-coronary: 37 mm   Sinus of Valsalva Height:   Non-coronary: 27.8 mm   Right-coronary: 25.2 mm   Left-coronary: 23.3 mm   Sinotubular Junction: 34 mm   Ascending Thoracic Aorta: 40 mm   Coronary Arteries: Normal coronary origin. Right dominance. The study was performed without use of NTG and is insufficient for plaque evaluation. Please refer to recent cardiac catheterization for coronary assessment.   Cardiac Morphology:   Right Atrium: Right atrial size is within normal limits.   Right Ventricle: The right ventricular cavity is within normal limits.   Left Atrium: Left atrial size is normal in size with no left atrial appendage filling defect.   Left Ventricle: The ventricular cavity size is within normal limits. There are no stigmata of prior infarction.   Pulmonary arteries: Normal in size without proximal filling defect.   Pulmonary veins: Normal pulmonary venous drainage.   Pericardium: Normal thickness with no significant effusion or calcium present.   Mitral Valve: The mitral valve is normal structure without significant calcification.   Aorta: Severe protruding mobile atheroma noted in the descending aorta after the take off of the left subclavian.   Extra-cardiac findings: See attached radiology report for non-cardiac structures.   IMPRESSION: 1. Severe aortic stenosis with severe bulky calcification of the  NCC.   2. Annular measurements support a 26 mm S3 TAVR (532 mm2). Would recommend against self-expanding TAVR due to bulky calcifications.   3. Mild annular calcifications under the [place] and LCC.   4. Sufficient coronary to annulus distance.   5. Optimal Fluoroscopic Angle for Delivery: LAO 14 CAU 16   6. Mildly dilated ascending aorta up to 40 mm.   7.  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 Structural heart team should be aware of this.   [place] Bells T. Audie Box, MD     Electronically Signed   By: Eleonore Chiquito M.D.   On: 05/20/2022 09:03    Addended by Geralynn Rile, MD on 05/20/2022  9:06 AM    Study Result   Narrative & Impression  EXAM: OVER-READ INTERPRETATION  CT CHEST   The following report is a limited chest CT over-read performed by radiologist Dr. Salvatore Marvel of [facility], [place] on 05/18/2022. This over-read does not include interpretation of cardiac or coronary anatomy or pathology. The cardiac valve CTA interpretation by the cardiologist is attached.   COMPARISON:  None Available.   FINDINGS: Please see the separate concurrent chest CT angiogram report for details.   IMPRESSION: Please see the separate concurrent chest CT angiogram report for details.   Electronically Signed: By: Ilona Sorrel M.D. On: 05/18/2022 12:02        Narrative & Impression  CLINICAL DATA:  Aortic valve replacement (TAVR), pre-op eval. 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   EXAM: CT ANGIOGRAPHY CHEST, ABDOMEN AND PELVIS   TECHNIQUE: Multidetector CT imaging through the chest, abdomen and pelvis was performed using the standard protocol during bolus administration of intravenous contrast. Multiplanar reconstructed images and MIPs were obtained and reviewed to evaluate the vascular anatomy.   RADIATION DOSE REDUCTION: This exam was performed according to the departmental dose-optimization program which includes automated exposure control, adjustment of the mA and/or kV according to patient size and/or use of iterative reconstruction technique.   CONTRAST:  167m OMNIPAQUE IOHEXOL 350 MG/ML SOLN   COMPARISON:  None Available.   FINDINGS: CTA CHEST FINDINGS   Cardiovascular: Mild cardiomegaly. Diffuse thickening and  coarse calcification of the aortic valve. No significant pericardial effusion/thickening. Atherosclerotic thoracic aorta with dilated 4.0 cm ascending thoracic aorta. Normal caliber pulmonary arteries. No central pulmonary emboli.   Mediastinum/Nodes: No discrete thyroid nodules. Unremarkable esophagus. No pathologically enlarged axillary, mediastinal or hilar lymph nodes.   Lungs/Pleura: No pneumothorax. No pleural effusion. A few scattered small solid pulmonary nodules in both lungs, largest 0.4 cm in the posterior left lower lobe (series 5/image 66).   Musculoskeletal: No aggressive appearing focal osseous lesions. Marked thoracic spondylosis.   CTA ABDOMEN AND PELVIS FINDINGS   Hepatobiliary: Normal liver with no liver mass. Cholelithiasis. No biliary ductal dilatation.   Pancreas: Small cystic 1.0 cm pancreatic tail lesion with peripheral coarse calcifications (series 4/image 105). No additional pancreatic lesions. No pancreatic duct dilation.   Spleen: Normal size. No mass.   Adrenals/Urinary Tract: No discrete adrenal nodules. No hydronephrosis. Subcentimeter hypodense anterior upper right renal cortical lesion is too small to characterize, for which no follow-up is recommended. Otherwise no contour deforming renal masses. Normal bladder.   Stomach/Bowel: Normal non-distended stomach. Normal caliber small bowel with no small bowel wall thickening. Normal appendix. Normal large bowel with no diverticulosis, large bowel wall thickening or pericolonic fat stranding.   Vascular/Lymphatic: Atherosclerotic abdominal aorta with  mildly dilated 2.8 cm infrarenal abdominal aorta. No pathologically enlarged lymph nodes in the abdomen or pelvis.   Reproductive: Top-normal size prostate with coarse nonspecific internal prostatic calcifications.   Other: No pneumoperitoneum, ascites or focal fluid collection.   Musculoskeletal: No aggressive appearing focal osseous  lesions. Chronic severe L2 and L4 vertebral compression fracture status post vertebroplasty. Marked lumbar spondylosis.   VASCULAR MEASUREMENTS PERTINENT TO TAVR:   AORTA:   Minimal Aortic Diameter-16.3 x 11.9 mm   Severity of Aortic Calcification-severe   RIGHT PELVIS:   Right Common Iliac Artery -   Minimal Diameter-12.3 x 8.2 mm   Tortuosity-moderate   Calcification-moderate   Right External Iliac Artery -   Minimal Diameter-8.4 x 8.3 mm   Tortuosity-moderate   Calcification-none   Right Common Femoral Artery -   Minimal Diameter-8.3 x 8.3 mm   Tortuosity-mild   Calcification-none   LEFT PELVIS:   Left Common Iliac Artery -   Minimal Diameter-11.6 x 7.6 mm   Tortuosity-mild   Calcification-moderate   Left External Iliac Artery -   Minimal Diameter-7.8 x 7.4 mm   Tortuosity-mild   Calcification-none   Left Common Femoral Artery -   Minimal Diameter-7.8 x 7.5 mm   Tortuosity-mild   Calcification-none   Review of the MIP images confirms the above findings.   IMPRESSION: 1. Vascular findings and measurements pertinent to potential TAVR procedure, as detailed. 2. Diffuse thickening and coarse calcification of the aortic valve, compatible with the reported history of aortic stenosis. 3. Mild cardiomegaly. 4. Dilated 4.0 cm ascending thoracic aorta. Recommend annual imaging followup by CTA or MRA. This recommendation follows 2010 ACCF/AHA/AATS/ACR/ASA/SCA/SCAI/SIR/STS/SVM Guidelines for the Diagnosis and Management of Patients with Thoracic Aortic Disease. Circulation. 2010; 121: I696-E952. Aortic aneurysm NOS (ICD10-I71.9). 5. Mildly dilated 2.8 cm infrarenal abdominal aorta. Recommend follow-up ultrasound every 5 years. This recommendation follows ACR consensus guidelines: White Paper of the ACR Incidental Findings Committee II on Vascular Findings. J Am Coll Radiol 2013; 10:789-794. 6. Cholelithiasis. 7. Small cystic 1.0 cm pancreatic  tail lesion with peripheral coarse calcifications. No overtly suspicious CT features. MRI abdomen without and with IV contrast may be considered for further evaluation. 8. A few scattered small solid pulmonary nodules, largest 0.4 cm. No follow-up needed if patient is low-risk (and has no known or suspected primary neoplasm). Non-contrast chest CT can be considered in 12 months if patient is high-risk. This recommendation follows the consensus statement: Guidelines for Management of Incidental Pulmonary Nodules Detected on CT Images: From the Fleischner Society 2017; Radiology 2017; 284:228-243. 9.  Aortic Atherosclerosis (ICD10-I70.0).     Electronically Signed   By: Ilona Sorrel M.D.   On: 05/18/2022 13:48    Impression:   This 81 year old gentleman has stage D1, severe, symptomatic aortic stenosis with [place] heart association class III symptoms of exertional fatigue and shortness of breath consistent with chronic diastolic congestive heart failure.  I have personally reviewed his 2D echocardiogram, cardiac catheterization, and CTA studies.  His echocardiogram shows a severely calcified and thickened aortic valve with restricted leaflet mobility.  The mean gradient is 51 mmHg with a peak gradient of 88 mmHg.  Aortic valve area by VTI is 0.97 cm.  Left ventricular ejection fraction is greater than 75% with moderate LVH and grade 1 diastolic dysfunction.  Cardiac catheterization shows widely patent coronary arteries without stenosis.  Right heart pressures were normal.  I agree that aortic valve replacement is indicated in this patient for relief of his progressive symptoms  and to prevent left ventricular dysfunction.  Given his advanced age and decreased mobility I think that transcatheter aortic valve replacement would be the best option for treating him.  His gated cardiac CTA shows anatomy suitable for TAVR using a SAPIEN 3 valve.  His abdominal and pelvic CTA shows adequate pelvic  vascular anatomy to allow transfemoral insertion but there is severe protruding mobile atheroma in the distal aortic arch and proximal descending aorta after the takeoff of the left subclavian artery.  We reviewed his studies at our multidisciplinary heart valve meeting and Dr. Burt Knack and I felt that it would be best to avoid this area to prevent dislodgment and emboli.  His left subclavian artery appears suitable for access and this would avoid the protruding atheroma.   The patient and his wife were counseled at length regarding treatment alternatives for management of severe symptomatic aortic stenosis. The risks and benefits of surgical intervention has been discussed in detail. Long-term prognosis with medical therapy was discussed. Alternative approaches such as conventional surgical aortic valve replacement, transcatheter aortic valve replacement, and palliative medical therapy were compared and contrasted at length. This discussion was placed in the context of the patient's own specific clinical presentation and past medical history. All of their questions have been addressed.    Following the decision to proceed with transcatheter aortic valve replacement, a discussion was held regarding what types of management strategies would be attempted intraoperatively in the event of life-threatening complications, including whether or not the patient would be considered a candidate for the use of cardiopulmonary bypass and/or conversion to open sternotomy for attempted surgical intervention.  I think he would be a candidate for emergent sternotomy to manage any intraoperative complications.  The patient is aware of the fact that transient use of cardiopulmonary bypass may be necessary. The patient has been advised of a variety of complications that might develop including but not limited to risks of death, stroke, paravalvular leak, aortic dissection or other major vascular complications, aortic annulus rupture,  device embolization, cardiac rupture or perforation, mitral regurgitation, acute myocardial infarction, arrhythmia, heart block or bradycardia requiring permanent pacemaker placement, congestive heart failure, respiratory failure, renal failure, pneumonia, infection, other late complications related to structural valve deterioration or migration, or other complications that might ultimately cause a temporary or permanent loss of functional independence or other long term morbidity. The patient provides full informed consent for the procedure as described and all questions were answered.       Plan:   TAVR via left subclavian access using a Sapien 3 valve.

## 2022-07-13 NOTE — Patient Outreach (Signed)
  Care Coordination   Initial Visit Note   07/13/2022 Name: Ryan Duke MRN: [id number] DOB: Sep 11, 1941  Ryan Duke is a 81 y.o. year old male who sees Janith Lima, MD for primary care. I spoke with  Ryan Duke by phone today  What matters to the patients health and wellness today?  Client asked for call back from LCSW at later time    Goals Addressed               This Visit's Progress     Patient asked for call back from LCSW at another time. (pt-stated)                                                  Care Coordination Interventions:   Active listening / Reflection utilized   LCSW described Care Coordination program to client Client asked for call back from LCSW at another time.        SDOH assessments and interventions completed:  No   Care Coordination Interventions Activated:  No  Care Coordination Interventions:  No, not indicated   Follow up plan: Follow up call scheduled for 07/22/22 at 1:00 PM     Encounter Outcome:  Pt. Visit Completed

## 2022-07-14 ENCOUNTER — Inpatient Hospital Stay (HOSPITAL_COMMUNITY): Payer: Medicare Other | Admitting: Anesthesiology

## 2022-07-14 ENCOUNTER — Inpatient Hospital Stay (HOSPITAL_COMMUNITY)
Admission: RE | Admit: 2022-07-14 | Discharge: 2022-07-15 | DRG: 267 | Disposition: A | Discharge status: Home / Self Care | Payer: Medicare Other | Attending: Cardiovascular Disease | Admitting: Cardiovascular Disease

## 2022-07-14 ENCOUNTER — Other Ambulatory Visit: Payer: Self-pay

## 2022-07-14 ENCOUNTER — Inpatient Hospital Stay (HOSPITAL_COMMUNITY)
Admission: RE | Admit: 2022-07-14 | Discharge: 2022-07-14 | Disposition: A | Discharge status: Home / Self Care | Payer: Medicare Other | Source: Ambulatory Visit | Attending: Cardiovascular Disease | Admitting: Cardiovascular Disease

## 2022-07-14 ENCOUNTER — Encounter (HOSPITAL_COMMUNITY): Payer: Self-pay | Admitting: Cardiovascular Disease

## 2022-07-14 ENCOUNTER — Inpatient Hospital Stay (HOSPITAL_COMMUNITY): Payer: Medicare Other | Admitting: Physician Assistant

## 2022-07-14 ENCOUNTER — Encounter (HOSPITAL_COMMUNITY)
Admission: RE | Disposition: A | Discharge status: Home / Self Care | Payer: Medicare Other | Source: Home / Self Care | Attending: Cardiovascular Disease

## 2022-07-14 DIAGNOSIS — N1831 Chronic kidney disease, stage 3a: Secondary | ICD-10-CM | POA: Diagnosis present

## 2022-07-14 DIAGNOSIS — F419 Anxiety disorder, unspecified: Secondary | ICD-10-CM | POA: Diagnosis present

## 2022-07-14 DIAGNOSIS — Z952 Presence of prosthetic heart valve: Secondary | ICD-10-CM | POA: Diagnosis not present

## 2022-07-14 DIAGNOSIS — E1151 Type 2 diabetes mellitus with diabetic peripheral angiopathy without gangrene: Secondary | ICD-10-CM | POA: Diagnosis not present

## 2022-07-14 DIAGNOSIS — E669 Obesity, unspecified: Secondary | ICD-10-CM | POA: Diagnosis not present

## 2022-07-14 DIAGNOSIS — Z6831 Body mass index (BMI) 31.0-31.9, adult: Secondary | ICD-10-CM

## 2022-07-14 DIAGNOSIS — I350 Nonrheumatic aortic (valve) stenosis: Principal | ICD-10-CM | POA: Diagnosis present

## 2022-07-14 DIAGNOSIS — K219 Gastro-esophageal reflux disease without esophagitis: Secondary | ICD-10-CM | POA: Diagnosis present

## 2022-07-14 DIAGNOSIS — Z87891 Personal history of nicotine dependence: Secondary | ICD-10-CM

## 2022-07-14 DIAGNOSIS — Z79899 Other long term (current) drug therapy: Secondary | ICD-10-CM

## 2022-07-14 DIAGNOSIS — Z7984 Long term (current) use of oral hypoglycemic drugs: Secondary | ICD-10-CM | POA: Diagnosis not present

## 2022-07-14 DIAGNOSIS — K862 Cyst of pancreas: Secondary | ICD-10-CM | POA: Diagnosis not present

## 2022-07-14 DIAGNOSIS — Z885 Allergy status to narcotic agent status: Secondary | ICD-10-CM

## 2022-07-14 DIAGNOSIS — E1122 Type 2 diabetes mellitus with diabetic chronic kidney disease: Secondary | ICD-10-CM | POA: Diagnosis not present

## 2022-07-14 DIAGNOSIS — I10 Essential (primary) hypertension: Secondary | ICD-10-CM | POA: Diagnosis not present

## 2022-07-14 DIAGNOSIS — Z794 Long term (current) use of insulin: Secondary | ICD-10-CM | POA: Diagnosis not present

## 2022-07-14 DIAGNOSIS — E785 Hyperlipidemia, unspecified: Secondary | ICD-10-CM | POA: Diagnosis present

## 2022-07-14 DIAGNOSIS — N183 Chronic kidney disease, stage 3 unspecified: Secondary | ICD-10-CM | POA: Diagnosis present

## 2022-07-14 DIAGNOSIS — E119 Type 2 diabetes mellitus without complications: Secondary | ICD-10-CM

## 2022-07-14 DIAGNOSIS — N1832 Chronic kidney disease, stage 3b: Secondary | ICD-10-CM | POA: Diagnosis not present

## 2022-07-14 DIAGNOSIS — Z006 Encounter for examination for normal comparison and control in clinical research program: Secondary | ICD-10-CM

## 2022-07-14 DIAGNOSIS — I129 Hypertensive chronic kidney disease with stage 1 through stage 4 chronic kidney disease, or unspecified chronic kidney disease: Secondary | ICD-10-CM | POA: Diagnosis not present

## 2022-07-14 DIAGNOSIS — Z888 Allergy status to other drugs, medicaments and biological substances status: Secondary | ICD-10-CM | POA: Diagnosis not present

## 2022-07-14 DIAGNOSIS — E118 Type 2 diabetes mellitus with unspecified complications: Secondary | ICD-10-CM | POA: Diagnosis present

## 2022-07-14 DIAGNOSIS — R918 Other nonspecific abnormal finding of lung field: Secondary | ICD-10-CM | POA: Diagnosis present

## 2022-07-14 DIAGNOSIS — Z8249 Family history of ischemic heart disease and other diseases of the circulatory system: Secondary | ICD-10-CM

## 2022-07-14 DIAGNOSIS — I7143 Infrarenal abdominal aortic aneurysm, without rupture: Secondary | ICD-10-CM | POA: Diagnosis not present

## 2022-07-14 HISTORY — DX: Presence of prosthetic heart valve: Z95.2

## 2022-07-14 HISTORY — PX: TEE WITHOUT CARDIOVERSION: SHX5443

## 2022-07-14 LAB — POCT I-STAT 7, (LYTES, BLD GAS, ICA,H+H)
Acid-Base Excess: 1 mmol/L (ref 0.0–2.0)
Bicarbonate: 26.8 mmol/L (ref 20.0–28.0)
Calcium, Ion: 1.2 mmol/L (ref 1.15–1.40)
HCT: 40 % (ref 39.0–52.0)
Hemoglobin: 13.6 g/dL (ref 13.0–17.0)
O2 Saturation: 100 %
Potassium: 3.4 mmol/L — ABNORMAL LOW (ref 3.5–5.1)
Sodium: 139 mmol/L (ref 135–145)
TCO2: 28 mmol/L (ref 22–32)
pCO2 arterial: 46.2 mmHg (ref 32–48)
pH, Arterial: 7.372 (ref 7.35–7.45)
pO2, Arterial: 453 mmHg — ABNORMAL HIGH (ref 83–108)

## 2022-07-14 LAB — GLUCOSE, CAPILLARY
Glucose-Capillary: 138 mg/dL — ABNORMAL HIGH (ref 70–99)
Glucose-Capillary: 221 mg/dL — ABNORMAL HIGH (ref 70–99)

## 2022-07-14 LAB — POCT I-STAT, CHEM 8
BUN: 25 mg/dL — ABNORMAL HIGH (ref 8–23)
BUN: 29 mg/dL — ABNORMAL HIGH (ref 8–23)
Calcium, Ion: 1.23 mmol/L (ref 1.15–1.40)
Calcium, Ion: 1.16 mmol/L (ref 1.15–1.40)
Chloride: 104 mmol/L (ref 98–111)
Chloride: 105 mmol/L (ref 98–111)
Creatinine, Ser: 1.4 mg/dL — ABNORMAL HIGH (ref 0.61–1.24)
Creatinine, Ser: 1.4 mg/dL — ABNORMAL HIGH (ref 0.61–1.24)
Glucose, Bld: 147 mg/dL — ABNORMAL HIGH (ref 70–99)
Glucose, Bld: 181 mg/dL — ABNORMAL HIGH (ref 70–99)
HCT: 43 % (ref 39.0–52.0)
HCT: 43 % (ref 39.0–52.0)
Hemoglobin: 14.6 g/dL (ref 13.0–17.0)
Hemoglobin: 14.6 g/dL (ref 13.0–17.0)
Potassium: 3.5 mmol/L (ref 3.5–5.1)
Potassium: 3.7 mmol/L (ref 3.5–5.1)
Sodium: 142 mmol/L (ref 135–145)
Sodium: 141 mmol/L (ref 135–145)
TCO2: 26 mmol/L (ref 22–32)
TCO2: 26 mmol/L (ref 22–32)

## 2022-07-14 LAB — ECHO TEE
AR max vel: 2.48 cm2
AV Area VTI: 2.91 cm2
AV Area mean vel: 1.21 cm2
AV Mean grad: 4 mmHg
AV Peak grad: 10 mmHg
Ao pk vel: 1.58 m/s

## 2022-07-14 SURGERY — IMPLANTATION, AORTIC VALVE, TRANSCATHETER, SUBCLAVIAN ARTERY APPROACH
Anesthesia: General

## 2022-07-14 MED ORDER — ONDANSETRON HCL 4 MG/2ML IJ SOLN: 4.0000 mg | Freq: Four times a day (QID) | INTRAMUSCULAR | Status: DC | PRN

## 2022-07-14 MED ORDER — CLOPIDOGREL BISULFATE 75 MG PO TABS
75.0000 mg | ORAL_TABLET | Freq: Every day | ORAL | Status: DC
  Administered 2022-07-15: 75 mg via ORAL
  Filled 2022-07-14: qty 1

## 2022-07-14 MED ORDER — SODIUM CHLORIDE 0.9 % IV SOLN: 250.0000 mL | INTRAVENOUS | Status: DC | PRN

## 2022-07-14 MED ORDER — SODIUM CHLORIDE 0.9% FLUSH: 3.0000 mL | Freq: Two times a day (BID) | INTRAVENOUS | Status: DC

## 2022-07-14 MED ORDER — NITROGLYCERIN IN D5W 200-5 MCG/ML-% IV SOLN: 0.0000 ug/min | INTRAVENOUS | Status: DC

## 2022-07-14 MED ORDER — SODIUM CHLORIDE 0.9% FLUSH: 3.0000 mL | INTRAVENOUS | Status: DC | PRN

## 2022-07-14 MED ORDER — MORPHINE SULFATE (PF) 2 MG/ML IV SOLN
INTRAVENOUS | Status: AC
  Filled 2022-07-14: qty 1

## 2022-07-14 MED ORDER — DULOXETINE HCL 60 MG PO CPEP
60.0000 mg | ORAL_CAPSULE | Freq: Every day | ORAL | Status: DC
Start: 2022-07-14 — End: 2022-07-15
  Administered 2022-07-14 – 2022-07-15 (×2): 60 mg via ORAL
  Filled 2022-07-14 (×2): qty 1

## 2022-07-14 MED ORDER — ACETAMINOPHEN 650 MG RE SUPP: 650.0000 mg | Freq: Four times a day (QID) | RECTAL | Status: DC | PRN

## 2022-07-14 MED ORDER — ORAL CARE MOUTH RINSE: 15.0000 mL | Freq: Once | OROMUCOSAL | Status: AC

## 2022-07-14 MED ORDER — ROCURONIUM BROMIDE 10 MG/ML (PF) SYRINGE
PREFILLED_SYRINGE | INTRAVENOUS | Status: DC | PRN
  Administered 2022-07-14: 70 mg via INTRAVENOUS
  Administered 2022-07-14: 10 mg via INTRAVENOUS

## 2022-07-14 MED ORDER — ALBUTEROL SULFATE HFA 108 (90 BASE) MCG/ACT IN AERS
INHALATION_SPRAY | RESPIRATORY_TRACT | Status: DC | PRN
  Administered 2022-07-14: 6 via RESPIRATORY_TRACT

## 2022-07-14 MED ORDER — ASPIRIN 81 MG PO CHEW
81.0000 mg | CHEWABLE_TABLET | Freq: Every day | ORAL | Status: DC
  Administered 2022-07-15: 81 mg via ORAL
  Filled 2022-07-14: qty 1

## 2022-07-14 MED ORDER — PROPOFOL 10 MG/ML IV BOLUS
INTRAVENOUS | Status: DC | PRN
  Administered 2022-07-14: 100 mg via INTRAVENOUS
  Administered 2022-07-14: 50 mg via INTRAVENOUS

## 2022-07-14 MED ORDER — FENTANYL CITRATE (PF) 100 MCG/2ML IJ SOLN
INTRAMUSCULAR | Status: AC
  Filled 2022-07-14: qty 2

## 2022-07-14 MED ORDER — SODIUM CHLORIDE 0.9 % IV SOLN: 250.0000 mL | INTRAVENOUS | Status: DC

## 2022-07-14 MED ORDER — INSULIN ASPART 100 UNIT/ML IJ SOLN: 0.0000 [IU] | INTRAMUSCULAR | Status: DC | PRN

## 2022-07-14 MED ORDER — CHLORHEXIDINE GLUCONATE 4 % EX LIQD
60.0000 mL | Freq: Once | CUTANEOUS | Status: DC
  Filled 2022-07-14: qty 60

## 2022-07-14 MED ORDER — INSULIN ASPART 100 UNIT/ML IJ SOLN
INTRAMUSCULAR | Status: AC
  Filled 2022-07-14: qty 1

## 2022-07-14 MED ORDER — CHLORHEXIDINE GLUCONATE 0.12 % MT SOLN
15.0000 mL | Freq: Once | OROMUCOSAL | Status: AC
  Administered 2022-07-14: 15 mL via OROMUCOSAL
  Filled 2022-07-14: qty 15

## 2022-07-14 MED ORDER — MORPHINE SULFATE (PF) 2 MG/ML IV SOLN
1.0000 mg | INTRAVENOUS | Status: DC | PRN
  Administered 2022-07-14 (×2): 2 mg via INTRAVENOUS

## 2022-07-14 MED ORDER — PROTAMINE SULFATE 10 MG/ML IV SOLN
INTRAVENOUS | Status: DC | PRN
  Administered 2022-07-14: 160 mg via INTRAVENOUS

## 2022-07-14 MED ORDER — LACTATED RINGERS IV SOLN: INTRAVENOUS | Status: DC | PRN

## 2022-07-14 MED ORDER — SODIUM CHLORIDE 0.9 % IV SOLN: INTRAVENOUS | Status: AC

## 2022-07-14 MED ORDER — ACETAMINOPHEN 325 MG PO TABS: 650.0000 mg | ORAL_TABLET | Freq: Four times a day (QID) | ORAL | Status: DC | PRN

## 2022-07-14 MED ORDER — CEFAZOLIN SODIUM-DEXTROSE 2-4 GM/100ML-% IV SOLN
2.0000 g | Freq: Three times a day (TID) | INTRAVENOUS | Status: AC
  Administered 2022-07-14 – 2022-07-15 (×2): 2 g via INTRAVENOUS
  Filled 2022-07-14 (×2): qty 100

## 2022-07-14 MED ORDER — PROTAMINE SULFATE 10 MG/ML IV SOLN
INTRAVENOUS | Status: AC
  Filled 2022-07-14: qty 10

## 2022-07-14 MED ORDER — HEPARIN SODIUM (PORCINE) 1000 UNIT/ML IJ SOLN
INTRAMUSCULAR | Status: DC | PRN
  Administered 2022-07-14 (×2): 16000 [IU] via INTRAVENOUS

## 2022-07-14 MED ORDER — IOHEXOL 350 MG/ML SOLN
INTRAVENOUS | Status: DC | PRN
  Administered 2022-07-14: 60 mL via INTRA_ARTERIAL

## 2022-07-14 MED ORDER — TAMSULOSIN HCL 0.4 MG PO CAPS
0.4000 mg | ORAL_CAPSULE | Freq: Every day | ORAL | Status: DC
Start: 2022-07-14 — End: 2022-07-15
  Administered 2022-07-14 – 2022-07-15 (×2): 0.4 mg via ORAL
  Filled 2022-07-14 (×2): qty 1

## 2022-07-14 MED ORDER — CLEVIDIPINE BUTYRATE 0.5 MG/ML IV EMUL
INTRAVENOUS | Status: DC | PRN
  Administered 2022-07-14: 1 mg/h via INTRAVENOUS

## 2022-07-14 MED ORDER — PREGABALIN 75 MG PO CAPS: 150.0000 mg | ORAL_CAPSULE | ORAL | Status: DC

## 2022-07-14 MED ORDER — LIDOCAINE 2% (20 MG/ML) 5 ML SYRINGE
INTRAMUSCULAR | Status: DC | PRN
  Administered 2022-07-14: 80 mg via INTRAVENOUS

## 2022-07-14 MED ORDER — LACTATED RINGERS IV SOLN: INTRAVENOUS | Status: DC

## 2022-07-14 MED ORDER — INSULIN ASPART 100 UNIT/ML IJ SOLN
0.0000 [IU] | INTRAMUSCULAR | Status: DC
  Administered 2022-07-14: 8 [IU] via SUBCUTANEOUS
  Administered 2022-07-14: 4 [IU] via SUBCUTANEOUS
  Administered 2022-07-15 (×2): 8 [IU] via SUBCUTANEOUS

## 2022-07-14 MED ORDER — PREGABALIN 75 MG PO CAPS: 150.0000 mg | ORAL_CAPSULE | Freq: Two times a day (BID) | ORAL | Status: DC | PRN

## 2022-07-14 MED ORDER — PRAVASTATIN SODIUM 40 MG PO TABS
40.0000 mg | ORAL_TABLET | Freq: Every day | ORAL | Status: DC
  Administered 2022-07-14 – 2022-07-15 (×2): 40 mg via ORAL
  Filled 2022-07-14 (×2): qty 1

## 2022-07-14 MED ORDER — ONDANSETRON HCL 4 MG/2ML IJ SOLN
INTRAMUSCULAR | Status: DC | PRN
  Administered 2022-07-14: 4 mg via INTRAVENOUS

## 2022-07-14 MED ORDER — SODIUM CHLORIDE 0.9 % IV SOLN: INTRAVENOUS | Status: DC

## 2022-07-14 MED ORDER — PREGABALIN 75 MG PO CAPS
150.0000 mg | ORAL_CAPSULE | Freq: Every day | ORAL | Status: DC
  Administered 2022-07-15: 150 mg via ORAL
  Filled 2022-07-14: qty 2

## 2022-07-14 MED ORDER — FENTANYL CITRATE (PF) 100 MCG/2ML IJ SOLN
INTRAMUSCULAR | Status: DC | PRN
  Administered 2022-07-14: 25 ug via INTRAVENOUS
  Administered 2022-07-14: 50 ug via INTRAVENOUS
  Administered 2022-07-14: 25 ug via INTRAVENOUS

## 2022-07-14 MED ORDER — CHLORHEXIDINE GLUCONATE 4 % EX LIQD
30.0000 mL | CUTANEOUS | Status: DC
  Filled 2022-07-14: qty 30

## 2022-07-14 MED ORDER — CHLORHEXIDINE GLUCONATE 0.12 % MT SOLN
15.0000 mL | Freq: Once | OROMUCOSAL | Status: DC
  Filled 2022-07-14 (×2): qty 15

## 2022-07-14 MED ORDER — HEPARIN (PORCINE) IN NACL 1000-0.9 UT/500ML-% IV SOLN
INTRAVENOUS | Status: DC | PRN
  Administered 2022-07-14 (×3): 500 mL

## 2022-07-14 MED ORDER — SUGAMMADEX SODIUM 200 MG/2ML IV SOLN
INTRAVENOUS | Status: DC | PRN
  Administered 2022-07-14: 300 mg via INTRAVENOUS
  Administered 2022-07-14: 100 mg via INTRAVENOUS

## 2022-07-14 MED ORDER — DEXAMETHASONE SODIUM PHOSPHATE 10 MG/ML IJ SOLN
INTRAMUSCULAR | Status: DC | PRN
  Administered 2022-07-14: 4 mg via INTRAVENOUS

## 2022-07-14 MED ORDER — TRAMADOL HCL 50 MG PO TABS: 50.0000 mg | ORAL_TABLET | ORAL | Status: DC | PRN

## 2022-07-14 SURGICAL SUPPLY — 30 items
BAG SNAP BAND KOVER 36X36 (MISCELLANEOUS) ×6 IMPLANT
BALLN TRUE 20X4.5 (BALLOONS) ×3
BALLOON TRUE 20X4.5 (BALLOONS) IMPLANT
BLANKET WARM UNDERBOD FULL ACC (MISCELLANEOUS) ×3 IMPLANT
CABLE ADAPT PACING TEMP 12FT (ADAPTER) ×1 IMPLANT
CATH 26 ULTRA DELIVERY (CATHETERS) ×2 IMPLANT
CATH DIAG 6FR PIGTAIL ANGLED (CATHETERS) ×2 IMPLANT
CATH INFINITI 6F AL1 (CATHETERS) ×1 IMPLANT
CATH S G BIP PACING (CATHETERS) ×1 IMPLANT
CLOSURE MYNX CONTROL 6F/7F (Vascular Products) ×1 IMPLANT
CRIMPER (MISCELLANEOUS) ×1 IMPLANT
DEVICE INFLATION ATRION QL2530 (MISCELLANEOUS) ×1 IMPLANT
ELECT DEFIB PAD ADLT CADENCE (PAD) ×1 IMPLANT
GLOVE ECLIPSE 7.0 STRL STRAW (GLOVE) ×3 IMPLANT
KIT HEART LEFT (KITS) ×3 IMPLANT
KIT MICROPUNCTURE NIT STIFF (SHEATH) ×1 IMPLANT
KIT SAPIAN 3 ULTRA RESILIA 26 (Valve) ×2 IMPLANT
PACK CARDIAC CATHETERIZATION (CUSTOM PROCEDURE TRAY) ×3 IMPLANT
SHEATH BRITE TIP 7FR 35CM (SHEATH) ×1 IMPLANT
SHEATH INTRODUCER SET 20-26 (SHEATH) ×1 IMPLANT
SHEATH PINNACLE 6F 10CM (SHEATH) ×2 IMPLANT
SLEEVE REPOSITIONING LENGTH 30 (MISCELLANEOUS) ×1 IMPLANT
STOPCOCK MORSE 400PSI 3WAY (MISCELLANEOUS) ×6 IMPLANT
SYR MEDRAD MARK V 150ML (SYRINGE) ×1 IMPLANT
TRANSDUCER W/STOPCOCK (MISCELLANEOUS) ×6 IMPLANT
TUBING CONTRAST HIGH PRESS 48 (TUBING) ×1 IMPLANT
WIRE EMERALD 3MM-J .035X150CM (WIRE) ×1 IMPLANT
WIRE EMERALD 3MM-J .035X260CM (WIRE) ×1 IMPLANT
WIRE EMERALD ST .035X260CM (WIRE) ×4 IMPLANT
WIRE SAFARI SM CURVE 275 (WIRE) ×1 IMPLANT

## 2022-07-14 NOTE — Anesthesia Procedure Notes (Signed)
Arterial Line Insertion Start/End8/15/2023 10:30 AM, 07/14/2022 10:32 AM Performed by: Kyung Rudd, CRNA, CRNA  Patient location: Pre-op. Preanesthetic checklist: patient identified, IV checked, site marked, risks and benefits discussed, surgical consent, monitors and equipment checked, pre-op evaluation, timeout performed and anesthesia consent Lidocaine 1% used for infiltration Right, radial was placed Catheter size: 20 G Hand hygiene performed , maximum sterile barriers used  and Seldinger technique used  Attempts: 2 Procedure performed without using ultrasound guided technique. Following insertion, dressing applied and Biopatch. Post procedure assessment: normal  Patient tolerated the procedure well with no immediate complications. Additional procedure comments: Lynnea Ferrier, SRNA attempted once prior to insertion.

## 2022-07-14 NOTE — Op Note (Signed)
[facility]   MULTIDISCIPLINARY HEART VALVE TEAM   TAVR OPERATIVE NOTE   Date of Procedure:  07/14/2022  Preoperative Diagnosis: Severe Aortic Stenosis   Postoperative Diagnosis: Same   Procedure:   Transcatheter Aortic Valve Replacement - Left Subclavian Artery Approach  Edwards Sapien 3 Ultra Resilia THV (size 26 mm, model # 9755RSL, serial # [id number])   Co-Surgeons:  [name], MD and Sherren Mocha, MD  Anesthesiologist:  G. Kalman Shan, MD  Echocardiographer:  Viona Gilmore. O'Neal, MD  Pre-operative Echo Findings: Severe aortic stenosis Normal left ventricular systolic function  Post-operative Echo Findings: Mild  paravalvular leak Normal left ventricular systolic function   BRIEF CLINICAL NOTE AND INDICATIONS FOR SURGERY  This 81 year old gentleman has stage D1, severe, symptomatic aortic stenosis with New York heart association class III symptoms of exertional fatigue and shortness of breath consistent with chronic diastolic congestive heart failure.  I have personally reviewed his 2D echocardiogram, cardiac catheterization, and CTA studies.  His echocardiogram shows a severely calcified and thickened aortic valve with restricted leaflet mobility.  The mean gradient is 51 mmHg with a peak gradient of 88 mmHg.  Aortic valve area by VTI is 0.97 cm.  Left ventricular ejection fraction is greater than 75% with moderate LVH and grade 1 diastolic dysfunction.  Cardiac catheterization shows widely patent coronary arteries without stenosis.  Right heart pressures were normal.  I agree that aortic valve replacement is indicated in this patient for relief of his progressive symptoms and to prevent left ventricular dysfunction.  Given his advanced age and decreased mobility I think that transcatheter aortic valve replacement would be the best option for treating him.  His gated cardiac CTA shows anatomy suitable for TAVR using a SAPIEN 3 valve.  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  We reviewed his studies at our multidisciplinary heart valve meeting and Dr. Burt Knack and I felt that it would be best to avoid this area to prevent dislodgment and emboli.  His left subclavian artery appears suitable for access and this would avoid the protruding atheroma.   The patient and his wife were counseled at length regarding treatment alternatives for management of severe symptomatic aortic stenosis. The risks and benefits of surgical intervention has been discussed in detail. Long-term prognosis with medical therapy was discussed. Alternative approaches such as conventional surgical aortic valve replacement, transcatheter aortic valve replacement, and palliative medical therapy were compared and contrasted at length. This discussion was placed in the context of the patient's own specific clinical presentation and past medical history. All of their questions have been addressed.    Following the decision to proceed with transcatheter aortic valve replacement, a discussion was held regarding what types of management strategies would be attempted intraoperatively in the event of life-threatening complications, including whether or not the patient would be considered a candidate for the use of cardiopulmonary bypass and/or conversion to open sternotomy for attempted surgical intervention.  I think he would be a candidate for emergent sternotomy to manage any intraoperative complications.  The patient is aware of the fact that transient use of cardiopulmonary bypass may be necessary. The patient has been advised of a variety of complications that might develop including but not limited to risks of death, stroke, paravalvular leak, aortic dissection or other major vascular complications, aortic  annulus rupture, device embolization, cardiac rupture or perforation, mitral regurgitation, acute myocardial infarction, arrhythmia, heart block or bradycardia requiring permanent pacemaker placement, congestive heart failure, respiratory failure, renal failure, pneumonia, infection, other late complications related to structural valve deterioration or migration, or other complications that might ultimately cause a temporary or permanent loss of functional independence or other long term morbidity. The patient provides full informed consent for the procedure as described and all questions were answered.         DETAILS OF THE OPERATIVE PROCEDURE  PREPARATION:    The patient was brought to the operating room on the above mentioned date and appropriate monitoring was established by the anesthesia team. The patient was placed in the supine position on the operating table.  Intravenous antibiotics were administered.  General endotracheal anesthesia was induced uneventfully.  Baseline transesophageal echocardiogram was performed. The patient's abdomen and both groins were prepped and draped in a sterile manner. A time out procedure was performed.   PERIPHERAL ACCESS:    Using the modified Seldinger technique, femoral arterial and venous access was obtained with placement of 6 Fr sheaths on the right side.  A pigtail diagnostic catheter was passed through the right arterial sheath under fluoroscopic guidance into the aortic root.  A temporary transvenous pacemaker catheter was passed through the right femoral venous sheath under fluoroscopic guidance into the right ventricle.  The pacemaker was tested to ensure stable lead placement and pacemaker capture. Aortic root angiography was performed in order to determine the optimal angiographic angle for valve deployment.   LEFT SUBCLAVIAN ACCESS:   A transverse incision was made below the left clavicle and carried down through the subcutaneous tissue  using electrocautery. The pectoralis major muscle was split along its fibers and the pectoralis minor muscle retracted laterally. The left axillary artery was identified and encircled with a vessel loop. The patient was heparinized systemically and ACT verified > 250 seconds.  A double concentric purse string suture of CV-4 gortex was placed in the anterior wall of the artery. The artery was cannulated with a needle and a J- wire advanced into the ascending aorta. An 8 F sheath was inserted over the wire. The aortic valve was crossed with a JR4 catheter and a straight wire. This was exchanged for a pigtail catheter and position was confirmed in the LV apex. Simultaneous LV and Ao pressures were recorded.  The pigtail catheter was exchanged for a Safari wire in the LV apex.  Then a 78 F E-sheath was inserted into the axillary artery and the tip advanced into the aortic arch.  BALLOON AORTIC VALVULOPLASTY:   Performed using a 20 mm True balloon under rapid ventricular pacing. Hemodynamic recovery after pacing was good.  TRANSCATHETER HEART VALVE DEPLOYMENT:   An Edwards Sapien 3 Ultra Resilia transcatheter heart valve (size 26 mm) was prepared and crimped per manufacturer's guidelines, and the proper orientation of the valve is confirmed on the [employer] delivery system. The valve was advanced through the introducer sheath using normal technique until in an appropriate position in the ascending aorta beyond the sheath tip. The balloon was then retracted and using the fine-tuning wheel was centered on the valve.  The valve was carefully positioned across the aortic valve annulus. The Commander catheter was retracted using normal technique. Once final position of the valve has been confirmed by angiographic assessment, the valve is deployed while temporarily holding ventilation and during rapid ventricular pacing to maintain systolic blood pressure < 50 mmHg and pulse  pressure < 10 mmHg. The balloon  inflation is held for >3 seconds after reaching full deployment volume. Once the balloon has fully deflated the balloon is retracted into the ascending aorta and valve function is assessed using echocardiography.   Transesophageal echo assessment shows significant paravalvular regurgitation at the junction of the noncoronary cusp and the left coronary cusp.  Valve deployment is felt to be high in this region.  The patient's hemodynamic recovery following valve deployment is good. The valve was post-dilated without any improvement in the paravalvular leak which was graded moderate. After a multidisciplenary discussion we decided to deploy a second valve.  A 26 mm Edwards SAPIEN 3 Ultral Resilia valve was prepped per protocol and advanced into the ascending aorta in a similar fashion to the initial valve.  After pulling the balloon back into the valve, the valve was advanced into the initial valve.  This is assessed by fluoroscopy and carefully positioned deeper into the LVOT.  Using rapid pacing and slow balloon inflation, the valve is deployed approximately 3 mm deeper than the original valve.  This resulted in significant improvement in paravalvular regurgitation with only trace to mild residual AI. The deployment balloon and guidewire are both removed. The gradient was satisfactory and there was trace to mild paravalvular leak with normal mitral valve function.  PROCEDURE COMPLETION:   The sheath was removed and subclavian artery closure performed.  Protamine was administered once subclavian arterial repair was complete. The temporary pacemaker, pigtail catheter and femoral sheaths were removed with manual pressure used for venous hemostasis.  A Mynx femoral closure device was utilized following removal of the diagnostic sheath in the femoral artery.  The patient tolerated the procedure well and is transported to the cath lab recovery area in stable condition. There were no immediate intraoperative  complications. All sponge instrument and needle counts are verified correct at completion of the operation.   No blood products were administered during the operation.  The patient received a total of 60 mL of intravenous contrast during the procedure.   [name], MD 07/14/2022

## 2022-07-14 NOTE — Transfer of Care (Signed)
Immediate Anesthesia Transfer of Care Note  Patient: Hallie Ishida Misko  Procedure(s) Performed: Transcatheter Aortic Valve Replacement-Subclavian (Left) TRANSESOPHAGEAL ECHOCARDIOGRAM (TEE)  Patient Location: PACU and Cath Lab  Anesthesia Type:General  Level of Consciousness: awake, drowsy and patient cooperative  Airway & Oxygen Therapy: Patient Spontanous Breathing and Patient connected to nasal cannula oxygen  Post-op Assessment: Report given to RN and Post -op Vital signs reviewed and stable  Post vital signs: Reviewed and stable  Last Vitals:  Vitals Value Taken Time  BP 167/61 07/14/22 1403  Temp    Pulse 72 07/14/22 1404  Resp 18 07/14/22 1404  SpO2 91 % 07/14/22 1404  Vitals shown include unvalidated device data.  Last Pain:  Vitals:   07/14/22 0925  TempSrc:   PainSc: 0-No pain         Complications: There were no known notable events for this encounter.

## 2022-07-14 NOTE — Anesthesia Postprocedure Evaluation (Signed)
Anesthesia Post Note  Patient: Ryan Duke  Procedure(s) Performed: Transcatheter Aortic Valve Replacement-Subclavian (Left) TRANSESOPHAGEAL ECHOCARDIOGRAM (TEE)     Patient location during evaluation: PACU Anesthesia Type: General Level of consciousness: awake and alert Pain management: pain level controlled Vital Signs Assessment: post-procedure vital signs reviewed and stable Respiratory status: spontaneous breathing, nonlabored ventilation, respiratory function stable and patient connected to nasal cannula oxygen Cardiovascular status: blood pressure returned to baseline and stable Postop Assessment: no apparent nausea or vomiting Anesthetic complications: no   There were no known notable events for this encounter.  Last Vitals:  Vitals:   07/14/22 0917 07/14/22 1353  BP: (!) 157/85 (!) 172/61  Pulse: (!) 56 70  Resp: 18 20  Temp: 36.7 C 37 C  SpO2: 95%     Last Pain:  Vitals:   07/14/22 1353  TempSrc: Temporal  PainSc: 0-No pain                 Raffaele Derise S

## 2022-07-14 NOTE — Progress Notes (Signed)
  [place] VALVE TEAM  Patient doing well s/p valve in valve TAVR. He is hemodynamically stable. Groin and L [place] sites are stable. ECG with NSR and no high grade block. Plan for early ambulation after bedrest completed and hopeful discharge over the next 24-48 hours.   Kathyrn Drown NP-C Structural Heart Team  Pager: [phone] Phone: [phone]

## 2022-07-14 NOTE — Interval H&P Note (Signed)
History and Physical Interval Note:  07/14/2022 9:56 AM  Ryan Duke  has presented today for surgery, with the diagnosis of Severe Aortic Stenosis.  The various methods of treatment have been discussed with the patient and family. After consideration of risks, benefits and other options for treatment, the patient has consented to  Procedure(s): Transcatheter Aortic Valve Replacement-Subclavian (Left) TRANSESOPHAGEAL ECHOCARDIOGRAM (TEE) (N/A) as a surgical intervention.  The patient's history has been reviewed, patient examined, no change in status, stable for surgery.  I have reviewed the patient's chart and labs.  Questions were answered to the patient's satisfaction.     [name]

## 2022-07-14 NOTE — Op Note (Signed)
[facility]   MULTIDISCIPLINARY HEART VALVE TEAM   TAVR OPERATIVE NOTE   Date of Procedure:  07/14/2022  Preoperative Diagnosis: Severe Aortic Stenosis   Postoperative Diagnosis: Same   Procedure:   Transcatheter Aortic Valve Replacement -left subclavian approach  Edwards Sapien 3 Ultra Resilia THV (size 26 mm)   Co-Surgeons:  [name], MD and Sherren Mocha, MD  Anesthesiologist:  Myrtie Soman, MD  Echocardiographer:  Marry Guan, MD  Pre-operative Echo Findings: Severe aortic stenosis Normal left ventricular systolic function  Post-operative Echo Findings: Mild paravalvular leak Normal left ventricular systolic function  BRIEF CLINICAL NOTE AND INDICATIONS FOR SURGERY  81 year old gentleman with hypertension, hyperlipidemia, stage III chronic kidney disease, who has developed progressive and now very severe aortic stenosis with a mean transvalvular gradient of 51 mmHg along with mild aortic insufficiency, associated with New York Heart Association functional class III symptoms of dyspnea and dizziness.  After multidisciplinary review of his case, we elected to proceed with TAVR via left subclavian approach.  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  During the course of the patient's preoperative work up they have been evaluated comprehensively by a multidisciplinary team of specialists coordinated through the [facility] in the [facility].  They have been demonstrated to suffer from symptomatic severe aortic stenosis as noted above. The patient has been counseled extensively as to the relative risks and benefits of all options for the treatment of severe aortic stenosis including long term medical therapy, conventional surgery for  aortic valve replacement, and transcatheter aortic valve replacement.  The patient has been independently evaluated in formal cardiac surgical consultation by Dr Cyndia Bent, who deemed the patient appropriate for TAVR. Based upon review of all of the patient's preoperative diagnostic tests they are felt to be candidate for transcatheter aortic valve replacement as an alternative to conventional surgery.    Following the decision to proceed with transcatheter aortic valve replacement, a discussion has been held regarding what types of management strategies would be attempted intraoperatively in the event of life-threatening complications, including whether or not the patient would be considered a candidate for the use of cardiopulmonary bypass and/or conversion to open sternotomy for attempted surgical intervention.  The patient has been advised of a variety of complications that might develop peculiar to this approach including but not limited to risks of death, stroke, paravalvular leak, aortic dissection or other major vascular complications, aortic annulus rupture, device embolization, cardiac rupture or perforation, acute myocardial infarction, arrhythmia, heart block or bradycardia requiring permanent pacemaker placement, congestive heart failure, respiratory failure, renal failure, pneumonia, infection, other late complications related to structural valve deterioration or migration, or other complications that might ultimately cause a temporary or permanent loss of functional independence or other long term morbidity.  The patient provides full informed consent for the procedure as described and all questions were answered preoperatively.  DETAILS OF THE OPERATIVE PROCEDURE  PREPARATION:   The patient is brought to the operating room on the above mentioned date and central monitoring was established by the anesthesia team including placement of a radial arterial line. The patient is placed in the supine  position on the operating table.  Intravenous antibiotics are administered. General endotracheal anesthesia is induced uneventfully.  Baseline transesophageal echocardiogram is performed. The patient's chest, abdomen, both  groins, and both lower extremities are prepared and draped in a sterile manner. A time out procedure is performed.   PERIPHERAL ACCESS:   Using ultrasound guidance, femoral arterial and venous access is obtained with placement of 6 Fr sheaths on the right side.  [place] images are digitally captured and stored in the patient's chart. A pigtail diagnostic catheter was passed through the femoral arterial sheath under fluoroscopic guidance into the aortic root.  A temporary transvenous pacemaker catheter was passed through the femoral venous sheath under fluoroscopic guidance into the right ventricle.  The pacemaker was tested to ensure stable lead placement and pacemaker capture. Aortic root angiography was performed in order to determine the optimal angiographic angle for valve deployment.  SUBCLAVIAN ACCESS:  Please see the complete operative note of Dr. Cyndia Bent for details of subclavian access.  Once the subclavian artery is exposed and the patient is fully heparinized with a therapeutic ACT, the subclavian artery is punctured and a 6 French sheath is inserted.  The aortic valve is crossed with an AL 2 catheter directing a straight tip wire across the valve and then changed out for a pigtail catheter which is ultimately changed out for a safari wire.  This is used to change out the 6 [place] sheath to a 14 [place] Edwards E sheath which is positioned in the ascending aorta beyond the subclavian ostium.  BALLOON AORTIC VALVULOPLASTY:  Performed with a 20 mm true balloon under rapid ventricular pacing with rapid hemodynamic recovery after pacing.  TRANSCATHETER HEART VALVE DEPLOYMENT:  An Edwards Sapien 3 transcatheter heart valve (size 26 mm) was prepared and crimped per manufacturer's  guidelines, and the proper orientation of the valve is confirmed on the [employer] delivery system. The valve was advanced through the introducer sheath using normal technique until in an appropriate position in the ascending aorta beyond the sheath tip. The balloon was then retracted and using the fine-tuning wheel was centered on the valve. The valve was then advanced across the aortic arch using appropriate flexion of the catheter. The valve was carefully positioned across the aortic valve annulus. The Commander catheter was retracted using normal technique. Once final position of the valve has been confirmed by angiographic assessment, the valve is deployed while temporarily holding ventilation and during rapid ventricular pacing to maintain systolic blood pressure < 50 mmHg and pulse pressure < 10 mmHg. The balloon inflation is held for >3 seconds after reaching full deployment volume. Once the balloon has fully deflated the balloon is retracted into the ascending aorta and valve function is assessed using echocardiography. The patient's hemodynamic recovery following valve deployment is good.  Transesophageal echo assessment shows significant paravalvular regurgitation at the junction of the noncoronary cusp and the left coronary cusp.  Valve deployment is felt to be high in this region.  The valve is postdilated without any significant improvement in the paravalvular regurgitation remains moderate.  After multidisciplinary discussion and review of options, we elected to deploy a second valve.  A 26 mm Edwards SAPIEN 3 Resilia valve is prepped per protocol and advanced into the ascending aorta in a similar fashion to the initial valve.  After pulling the balloon back into the valve, the valve was advanced into the initial valve.  This is assessed by fluoroscopy and carefully positioned deeper into the LVOT.  Using rapid pacing and slow balloon inflation, the valve is deployed approximately 3 mm deeper  than the original valve.  This resulted in significant improvement in paravalvular regurgitation with  only trace to mild residual AI.  The deployment balloon and guidewire are both removed. Echo demostrated acceptable post-procedural gradients, stable mitral valve function, and mild aortic insufficiency.    PROCEDURE COMPLETION:  Please see Dr. Vivi Martens complete note for subclavian artery hemostasis and closure.  The temporary pacemaker and pigtail catheters are removed. Mynx closure is used for contralateral femoral arterial hemostasis for the 6 Fr sheath.  The patient tolerated the procedure well and is transported to the recovery area in stable condition. There were no immediate intraoperative complications. All sponge instrument and needle counts are verified correct at completion of the operation.   Sherren Mocha, MD 07/14/2022 4:54 PM

## 2022-07-14 NOTE — Anesthesia Procedure Notes (Signed)
Procedure Name: Intubation Date/Time: 07/14/2022 11:13 AM  Performed by: Lorie Phenix, CRNAPre-anesthesia Checklist: Patient identified, Emergency Drugs available, Suction available and Patient being monitored Patient Re-evaluated:Patient Re-evaluated prior to induction Oxygen Delivery Method: Circle system utilized Preoxygenation: Pre-oxygenation with 100% oxygen Induction Type: IV induction Ventilation: Mask ventilation without difficulty and Two handed mask ventilation required Laryngoscope Size: Mac and 4 Grade View: Grade I Tube type: Oral Tube size: 7.5 mm Number of attempts: 1 Airway Equipment and Method: Stylet and Oral airway Placement Confirmation: ETT inserted through vocal cords under direct vision, positive ETCO2 and breath sounds checked- equal and bilateral Secured at: 23 cm Tube secured with: Tape Dental Injury: Teeth and Oropharynx as per pre-operative assessment  Comments: Two handed mask utilized due to room arrangement. Lynnea Ferrier, SRNA placed ETT.

## 2022-07-14 NOTE — Anesthesia Preprocedure Evaluation (Signed)
Anesthesia Evaluation  Patient identified by MRN, date of birth, ID band Patient awake    Reviewed: Allergy & Precautions, NPO status , Patient's Chart, lab work & pertinent test results  Airway Mallampati: III  TM Distance: <3 FB Neck ROM: Full    Dental no notable dental hx.    Pulmonary neg pulmonary ROS, former smoker,    Pulmonary exam normal breath sounds clear to auscultation       Cardiovascular hypertension, Pt. on medications + Peripheral Vascular Disease  Normal cardiovascular exam+ Valvular Problems/Murmurs AS  Rhythm:Regular Rate:Normal  1. Left ventricular ejection fraction, by estimation, is >75%. The left  ventricle has hyperdynamic function. The left ventricle has no regional  wall motion abnormalities. There is moderate asymmetric left ventricular  hypertrophy of the basal-septal  segment. Left ventricular diastolic parameters are consistent with Grade I  diastolic dysfunction (impaired relaxation).  2. Right ventricular systolic function is normal. The right ventricular  size is normal. Tricuspid regurgitation signal is inadequate for assessing  PA pressure.  3. Left atrial size was mildly dilated.  4. Right atrial size was mildly dilated.  5. The mitral valve is abnormal. Trivial mitral valve regurgitation.  6. The aortic valve has an indeterminant number of cusps. There is severe  calcifcation of the aortic valve. Aortic valve regurgitation is mild.  Severe aortic valve stenosis. Aortic valve area, by VTI measures 0.97 cm.  Aortic valve mean gradient  measures 51.0 mmHg. Aortic valve Vmax measures 4.70 m/s. Peak gradient  88.4 mmHg. Based on an LVOT diameter of 2.1 cm. DI is 0.28.  7. Aortic dilatation noted. There is borderline dilatation of the aortic  root, measuring 39 mm. There is mild dilatation of the ascending aorta,  measuring 41 mm.    Neuro/Psych Anxiety negative neurological ROS   negative psych ROS   GI/Hepatic Neg liver ROS, GERD  ,  Endo/Other  diabetes, Insulin Dependent  Renal/GU Renal InsufficiencyRenal disease  negative genitourinary   Musculoskeletal negative musculoskeletal ROS (+)   Abdominal   Peds negative pediatric ROS (+)  Hematology negative hematology ROS (+)   Anesthesia Other Findings   Reproductive/Obstetrics negative OB ROS                             Anesthesia Physical Anesthesia Plan  ASA: 4  Anesthesia Plan: General   Post-op Pain Management:    Induction: Intravenous  PONV Risk Score and Plan: 2 and Ondansetron, Dexamethasone and Treatment may vary due to age or medical condition  Airway Management Planned: Oral ETT  Additional Equipment: Arterial line  Intra-op Plan:   Post-operative Plan: Extubation in OR  Informed Consent: I have reviewed the patients History and Physical, chart, labs and discussed the procedure including the risks, benefits and alternatives for the proposed anesthesia with the patient or authorized representative who has indicated his/her understanding and acceptance.     Dental advisory given  Plan Discussed with: CRNA and Surgeon  Anesthesia Plan Comments:         Anesthesia Quick Evaluation

## 2022-07-14 NOTE — Progress Notes (Signed)
Pt admitted to rm 10 from cath lab. CHG wipe given. Initiate tel. VSS. Call bell within reach.   Lavenia Atlas, RN

## 2022-07-15 ENCOUNTER — Inpatient Hospital Stay (HOSPITAL_COMMUNITY): Payer: Medicare Other

## 2022-07-15 ENCOUNTER — Encounter (HOSPITAL_COMMUNITY): Payer: Self-pay | Admitting: Cardiovascular Disease

## 2022-07-15 DIAGNOSIS — I350 Nonrheumatic aortic (valve) stenosis: Principal | ICD-10-CM

## 2022-07-15 DIAGNOSIS — Z952 Presence of prosthetic heart valve: Secondary | ICD-10-CM | POA: Diagnosis not present

## 2022-07-15 LAB — CBC
HCT: 41.7 % (ref 39.0–52.0)
Hemoglobin: 13.4 g/dL (ref 13.0–17.0)
MCH: 29.5 pg (ref 26.0–34.0)
MCHC: 32.1 g/dL (ref 30.0–36.0)
MCV: 91.6 fL (ref 80.0–100.0)
Platelets: 177 10*3/uL (ref 150–400)
RBC: 4.55 MIL/uL (ref 4.22–5.81)
RDW: 13.9 % (ref 11.5–15.5)
WBC: 11.7 10*3/uL — ABNORMAL HIGH (ref 4.0–10.5)
nRBC: 0 % (ref 0.0–0.2)

## 2022-07-15 LAB — BASIC METABOLIC PANEL
Anion gap: 8 (ref 5–15)
BUN: 27 mg/dL — ABNORMAL HIGH (ref 8–23)
CO2: 23 mmol/L (ref 22–32)
Calcium: 8.3 mg/dL — ABNORMAL LOW (ref 8.9–10.3)
Chloride: 103 mmol/L (ref 98–111)
Creatinine, Ser: 1.62 mg/dL — ABNORMAL HIGH (ref 0.61–1.24)
GFR, Estimated: 42 mL/min — ABNORMAL LOW (ref >60)
Glucose, Bld: 224 mg/dL — ABNORMAL HIGH (ref 70–99)
Potassium: 3.5 mmol/L (ref 3.5–5.1)
Sodium: 134 mmol/L — ABNORMAL LOW (ref 135–145)

## 2022-07-15 LAB — GLUCOSE, CAPILLARY
Glucose-Capillary: 117 mg/dL — ABNORMAL HIGH (ref 70–99)
Glucose-Capillary: 206 mg/dL — ABNORMAL HIGH (ref 70–99)
Glucose-Capillary: 238 mg/dL — ABNORMAL HIGH (ref 70–99)

## 2022-07-15 LAB — MAGNESIUM: Magnesium: 2 mg/dL (ref 1.7–2.4)

## 2022-07-15 MED ORDER — ASPIRIN 81 MG PO CHEW: 81.0000 mg | CHEWABLE_TABLET | Freq: Every day | ORAL | 3 refills | Status: DC

## 2022-07-15 MED ORDER — CLOPIDOGREL BISULFATE 75 MG PO TABS: 75.0000 mg | ORAL_TABLET | Freq: Every day | ORAL | 1 refills | Status: DC

## 2022-07-15 NOTE — Progress Notes (Signed)
CARDIAC REHAB PHASE I   PRE:  Rate/Rhythm: 73 SR  BP:  Supine:   Sitting: 128/55  Standing:    SaO2: 95% RA  MODE:  Ambulation: 360 ft   POST:  Rate/Rhythm: 77 SR  BP:  Supine:   Sitting: 145/72  Standing:    SaO2: 96% RA Patient tolerated ambulation extremely well with minimal assistance and a rolling walker.  He uses a cane at home for balance. Education completed re: activity restrictions, incision care, when to call the doctor, diabetic nutrition tips, how to count carbohydrates.  His hemoglobin A1C was 7.2 this admission, reports he can follow a lower carbohydrate diet and has become lax.  Given handout to assist in counting carbs.  Also, given exercise progression handout, would like to participate in cardiac rehab, referral made to [facility]  Liliane Channel RN, BSN 07/15/2022 8:46 AM

## 2022-07-15 NOTE — Progress Notes (Signed)
D/c tele and IV. Went over AVS with pt and all questions were addressed.   Imagine Nest S Cammi Consalvo, RN  

## 2022-07-15 NOTE — Discharge Summary (Addendum)
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) (HCC)   Severe aortic stenosis   Allergies Allergies  Allergen Reactions   Farxiga [Dapagliflozin] Other (See Comments)    gout   Metformin And Related Other (See Comments)    acidosis   Atorvastatin Other (See Comments)    h/a   Benazepril Hcl Other (See Comments)    Was not effective   Flexeril [Cyclobenzaprine] Other (See Comments)    Hallucinations    Oxycodone Other (See Comments)    Hallucinations     Diagnostic Studies/Procedures    TAVR OPERATIVE NOTE     Date of Procedure:                07/14/2022   Preoperative Diagnosis:      Severe Aortic Stenosis    Postoperative Diagnosis:    Same    Procedure:        Transcatheter Aortic Valve Replacement - Left Subclavian Artery Approach             Edwards Sapien 3 Ultra Resilia THV (size 26 mm, model # 9755RSL, serial # [id number])              Co-Surgeons:                        [name], MD and Sherren Mocha, MD   Anesthesiologist:                  Rodell Perna, MD   Echocardiographer:              Viona Gilmore. O'Neal, MD   Pre-operative Echo Findings: Severe aortic stenosis Normal left ventricular systolic function   Post-operative Echo Findings: Mild  paravalvular leak Normal left ventricular systolic function _____________    Echo 07/15/22: completed but pending formal read at the time of discharge    History of Present Illness     Ryan Duke is a 81 y.o. male with a history of of HTN, HLD, IDDM, CKD stage IIIb, obesity (BMI 31) and severe aortic stenosis who presented to [facility] on 07/14/22 for planned TAVR.   He has a history of aortic stenosis. His most recent echo 04/14/22 showed EF >75%, severe aortic stenosis with a mean grad 51 mmHg, peak grad 88.4 mmHg, AVA 0.91 cm2, DVI 0.28, SVI 51, mild AI. [employer] 05/07/22 showed widely patent coronary arteries with no significant stenoses and normal right heart pressures and hemodynamics. He had some problematic teeth and underwent dental extractions on 05/14/22.  The patient has been evaluated by the multidisciplinary valve team and felt to have severe, symptomatic aortic stenosis and to be a suitable candidate for TAVR, which was set up for 07/14/22.   Hospital Course     Consultants: none   Severe AS: s/p successful TAVR with a 26 mm Edwards Sapien 3 Ultra Resilia THV x2 (given canting of first valve) via the subclavian approach (  given a severe protruding mobile atheroma noted in the descending aorta) on 07/14/22. Post operative echo completed but pending formal read. Groin sites are stable. ECG with sinus and old IRBBB but no high grade heart block. Started on Asprin and Plavix given double TAVR valve placement. He walked with cardiac rehab with no issues. Plan for discharge home today with close follow up in the office next week.   HTN: BP well controlled. Resume home meds.   CKD stage IIIb: creat 1.62 today which is a little bit above his baseline. Will recheck in the outpatient setting.   DMT2: treated with SSI. Resume home meds at discharge.   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 Recommend follow-up ultrasound every 5 years.   Pancreatic cyst: small cystic 1.0 cm pancreatic tail lesion with peripheral coarse  calcifications. No overtly suspicious CT features. MRI abdomen without and with IV contrast may be considered for further evaluation.   Pulmonary nodules: a few scattered small solid pulmonary nodules, largest 0.4 cm. No follow-up needed if patient is low-risk (and has no known or suspected primary neoplasm). Non-contrast chest CT can be considered in 12 months if patient is high-risk _____________  Discharge Vitals Blood pressure (!) 145/72, pulse 62, temperature (!) 97.5 F (36.4 C), temperature source Oral, resp. rate 18, weight 104.4 kg, SpO2 99 %.  Filed Weights   07/15/22 0420  Weight: 104.4 kg     GEN: Well nourished, well developed, in no acute distress HEENT: normal Neck: no JVD or masses Cardiac: RRR; no murmurs, rubs, or gallops,no edema  Respiratory:  clear to auscultation bilaterally, normal work of breathing GI: soft, nontender, nondistended, + BS MS: no deformity or atrophy Skin: warm and dry, no rash.  Groin/subclavian sites clear without hematoma or ecchymosis  Neuro:  Alert and Oriented x 3, Strength and sensation are intact Psych: euthymic mood, full affect   Labs & Radiologic Studies    CBC Recent Labs    07/14/22 1403 07/15/22 0259  WBC  --  11.7*  HGB 14.6 13.4  HCT 43.0 41.7  MCV  --  91.6  PLT  --  696   Basic Metabolic Panel Recent Labs    07/14/22 1403 07/15/22 0259  NA 141 134*  K 3.7 3.5  CL 105 103  CO2  --  23  GLUCOSE 181* 224*  BUN 29* 27*  CREATININE 1.40* 1.62*  CALCIUM  --  8.3*  MG  --  2.0   Liver Function Tests No results for input(s): "AST", "ALT", "ALKPHOS", "BILITOT", "PROT", "ALBUMIN" in the last 72 hours. No results for input(s): "LIPASE", "AMYLASE" in the last 72 hours. Cardiac Enzymes No results for input(s): "CKTOTAL", "CKMB", "CKMBINDEX", "TROPONINI" in the last 72 hours. BNP Invalid input(s): "POCBNP" D-Dimer No results for input(s): "DDIMER" in the last 72 hours. Hemoglobin A1C No results for input(s):  "HGBA1C" in the last 72 hours. Fasting Lipid Panel No results for input(s): "CHOL", "HDL", "LDLCALC", "TRIG", "CHOLHDL", "LDLDIRECT" in the last 72 hours. Thyroid Function Tests No results for input(s): "TSH", "T4TOTAL", "T3FREE", "THYROIDAB" in the last 72 hours.  Invalid input(s): "FREET3" _____________  ECHO TEE  Result Date: 07/14/2022    TRANSESOPHOGEAL ECHO REPORT   Patient Name:   Ryan Duke Date of Exam: 07/14/2022 Medical Rec #:  [id number]       Height:  71.0 in Accession #:    [id number]      Weight:       230.4 lb Date of Birth:  August 23, 1941       BSA:          2.239 m Patient Age:    61 years        BP:           155/69 mmHg Patient Gender: M               HR:           70 bpm. Exam Location:  Inpatient Procedure: Transesophageal Echo, Cardiac Doppler and Color Doppler Indications:    Aortic Stenosis  History:        Patient has prior history of Echocardiogram examinations, most                 recent 04/14/2022. PAD; Risk Factors:Hypertension, Diabetes and                 Dyslipidemia. Chronic kidney disease.                 Aortic Valve: 26 mm Sapien prosthetic, stented (TAVR) valve is                 present in the aortic position. Procedure Date: 07/14/2022.  Sonographer:    Darlina Sicilian RDCS Referring Phys: [phone] Levelle Edelen PROCEDURE: After discussion of the risks and benefits of a TEE, an informed consent was obtained from the patient. The patient was intubated. The transesophogeal probe was passed without difficulty through the esophogus of the patient. Imaged were obtained with the patient in a supine position. Sedation performed by different physician. The patient was monitored while under deep sedation. Anesthestetic sedation was provided intravenously by Anesthesiology: '150mg'$  of Propofol, '80mg'$  of Lidocaine. Image quality was good. The patient's vital signs; including heart rate, blood pressure, and oxygen saturation; remained stable throughout the procedure. The patient  developed no complications during the procedure. IMPRESSIONS  1. TEE guided TAVR. 26 mm S3 deployed with moderate paravalvular leak. A second 26 mm S3 ViV was performed with trivial to mild paravalulvar leak under the [place]. Vmax 1.6 m/s, MG 4.0 mmHG, EOA 2.91 cm2, DI 0.83. The aortic valve has been repaired/replaced. Aortic valve regurgitation is mild. Severe aortic valve stenosis. There is a 26 mm Sapien prosthetic (TAVR) valve present in the aortic position. Procedure Date: 07/14/2022.  2. Left ventricular ejection fraction, by estimation, is 70 to 75%. The left ventricle has hyperdynamic function. There is moderate concentric left ventricular hypertrophy of the basal-septal segment.  3. Right ventricular systolic function is normal. The right ventricular size is normal.  4. No left atrial/left atrial appendage thrombus was detected.  5. The mitral valve is grossly normal. Trivial mitral valve regurgitation. No evidence of mitral stenosis.  6. 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 The left ventricle has hyperdynamic function. The left ventricular internal cavity size was normal in size. There is moderate concentric left ventricular hypertrophy of the  basal-septal segment. Right Ventricle: The right ventricular size is normal. No increase in right ventricular wall thickness. Right ventricular systolic function is normal. Left Atrium: Left atrial size was normal in size. No left atrial/left atrial appendage thrombus was detected. Right Atrium: Right atrial size was normal in size. Pericardium: There is no evidence of pericardial effusion.  Mitral Valve: The mitral valve is grossly normal. Trivial mitral valve regurgitation. No evidence of mitral valve stenosis. Tricuspid Valve: The tricuspid valve is grossly normal. Tricuspid valve regurgitation is trivial. Aortic Valve: TEE guided  TAVR. 26 mm S3 deployed with moderate paravalvular leak. A second 26 mm S3 ViV was performed with trivial to mild paravalulvar leak under the [place]. Vmax 1.6 m/s, MG 4.0 mmHG, EOA 2.91 cm2, DI 0.83. The aortic valve has been repaired/replaced. Aortic valve regurgitation is mild. Severe aortic stenosis is present. Aortic valve mean gradient measures 4.0 mmHg. Aortic valve peak gradient measures 10.0 mmHg. Aortic valve area, by VTI measures 2.91 cm. There is a 26 mm Sapien prosthetic, stented (TAVR) valve present in the aortic position. Procedure Date: 07/14/2022. Pulmonic Valve: The pulmonic valve was grossly normal. Pulmonic valve regurgitation is not visualized. Aorta: The aortic root is normal in size and structure. There is severe, mobile (Grade V) atheroma plaque involving the descending aorta. Venous: The left upper pulmonary vein is normal. IAS/Shunts: The atrial septum is grossly normal.  LEFT VENTRICLE PLAX 2D LVOT diam:     2.12 cm LV SV:         84 LV SV Index:   37 LVOT Area:     3.53 cm  AORTIC VALVE AV Area (Vmax):    2.48 cm AV Area (Vmean):   1.21 cm AV Area (VTI):     2.91 cm AV Vmax:           158.00 cm/s AV Vmean:          219.750 cm/s AV VTI:            0.287 m AV Peak Grad:      10.0 mmHg AV Mean Grad:      4.0 mmHg LVOT Vmax:         111.00 cm/s LVOT Vmean:        75.100 cm/s LVOT VTI:          0.237 m LVOT/AV VTI ratio: 0.83  SHUNTS Systemic VTI:  0.24 m Systemic Diam: 2.12 cm Eleonore Chiquito MD Electronically signed by Eleonore Chiquito MD Signature Date/Time: 07/14/2022/4:10:41 PM    Final    Structural Heart Procedure  Result Date: 07/14/2022 See surgical note for result.  DG Chest 2 View  Result Date: 07/12/2022 CLINICAL DATA:  Preoperative evaluation for aortic surgery, history of aortic stenosis EXAM: CHEST - 2 VIEW COMPARISON:  09/15/2017, 05/18/2022 CT FINDINGS: Cardiac shadow is stable. Lungs are well aerated bilaterally. Minimal left basilar atelectasis is seen. Degenerative  changes of the thoracic spine are noted. IMPRESSION: Minimal left basilar atelectasis. Electronically Signed   By: Inez Catalina M.D.   On: 07/12/2022 02:34    Disposition   Pt is being discharged home today in good condition.  Follow-up Plans & Appointments     Follow-up Information     Eileen Stanford, PA-C Follow up on 07/24/2022.   Specialties: Cardiology, Radiology Why: '@11am'$ . Please arrive to your appointment at 10:45am. Contact information: [place] [phone]                Discharge Instructions     Amb Referral to Cardiac Rehabilitation   Complete by: As directed    Diagnosis: Valve Repair   Valve: Aortic   After initial evaluation and assessments completed: Virtual Based Care may be provided alone or in conjunction with Phase 2 Cardiac Rehab based on patient barriers.: Yes  Discharge Medications   Allergies as of 07/15/2022       Reactions   Farxiga [dapagliflozin] Other (See Comments)   gout   Metformin And Related Other (See Comments)   acidosis   Atorvastatin Other (See Comments)   h/a   Benazepril Hcl Other (See Comments)   Was not effective   Flexeril [cyclobenzaprine] Other (See Comments)   Hallucinations    Oxycodone Other (See Comments)   Hallucinations         Medication List     TAKE these medications    aspirin 81 MG chewable tablet Chew 1 tablet (81 mg total) by mouth daily.   atenolol-chlorthalidone 100-25 MG tablet Commonly known as: TENORETIC TAKE ONE TABLET BY MOUTH ONCE DAILY   clopidogrel 75 MG tablet Commonly known as: PLAVIX Take 1 tablet (75 mg total) by mouth daily with breakfast.   cyanocobalamin 500 MCG tablet Commonly known as: VITAMIN B12 Take 500 mcg by mouth daily.   DULoxetine 60 MG capsule Commonly known as: CYMBALTA TAKE ONE CAPSULE DAILY   fluticasone 50 MCG/ACT nasal spray Commonly known as: FLONASE Place 1 spray into both nostrils daily  as needed for allergies or rhinitis.   FreeStyle Libre 2 Reader Mila Doce 1 Act by Does not apply route daily.   FreeStyle Libre 2 Sensor Misc USE AS DIRECTED AND REPLACE EVERY 14 DAYS.   GlucaGen HypoKit 1 MG Solr injection Generic drug: glucagon Inject 1 mg into the vein once as needed for up to 1 dose for low blood sugar.   Jardiance 25 MG Tabs tablet Generic drug: empagliflozin TAKE ONE TABLET ONCE DAILY BEFORE BREAKFAST.   Kerendia 20 MG Tabs Generic drug: Finerenone Take 1 tablet by mouth daily.   losartan 100 MG tablet Commonly known as: COZAAR Take 1 tablet (100 mg total) by mouth daily. for high blood pressure   multivitamin with minerals Tabs tablet Take 1 tablet by mouth daily.   Myrbetriq 50 MG Tb24 tablet Generic drug: mirabegron ER TAKE 1 TABLET ONCE DAILY.   NovoFine 32G X 6 MM Misc Generic drug: Insulin Pen Needle 1 Act by Does not apply route daily.   pravastatin 40 MG tablet Commonly known as: PRAVACHOL TAKE ONE TABLET BY MOUTH ONCE DAILY.   pregabalin 150 MG capsule Commonly known as: LYRICA Take 150 mg by mouth See admin instructions. Take 150 mg daily, may take an additional 150 mg twice daily as needed for pain   PRESERVISION AREDS PO Take 1 tablet by mouth in the morning and at bedtime.   Soliqua 100-33 UNT-MCG/ML Sopn Generic drug: Insulin Glargine-Lixisenatide Inject 60 Units into the skin daily.   tamsulosin 0.4 MG Caps capsule Commonly known as: FLOMAX TAKE ONE CAPSULE BY MOUTH DAILY        Outstanding Labs/Studies   BMET  Duration of Discharge Encounter   Greater than 30 minutes including physician time.  SignedAngelena Form, PA-C 07/15/2022, 10:11 AM [phone]  Patient seen, examined. Available data reviewed. Agree with findings, assessment, and plan as outlined by Nell Range, PA-C.  The patient is independently interviewed and examined.  He is alert, oriented, no distress.  Lungs are clear bilaterally, heart is  regular rate and rhythm with a soft ejection murmur at the right upper sternal border, no diastolic murmur, abdomen soft nontender, left subclavian incision site is clear, right groin site is clear, extremities have no edema.  The patient's echocardiogram is reviewed and shows normal TAVR function with no more than trivial paravalvular  regurgitation and normal transvalvular gradients.  LV function is vigorous and there is no pericardial effusion.  I think the patient is medically stable for hospital discharge today.  Recommend aspirin and clopidogrel for 6 months, then indefinite aspirin.  Follow-up is arranged as above.  Reviewed postprocedural restrictions with the patient.  Sherren Mocha, M.D. 07/15/2022 10:51 AM

## 2022-07-15 NOTE — Inpatient Diabetes Management (Signed)
Inpatient Diabetes Program Recommendations  AACE/ADA: New Consensus Statement on Inpatient Glycemic Control (2015)  Target Ranges:  Prepandial:   less than 140 mg/dL      Peak postprandial:   less than 180 mg/dL (1-2 hours)      Critically ill patients:  140 - 180 mg/dL   Lab Results  Component Value Date   GLUCAP 117 (H) 07/15/2022   HGBA1C 7.2 (H) 07/10/2022    Review of Glycemic Control  Latest Reference Range & Units 07/14/22 09:18 07/14/22 20:55 07/15/22 00:12 07/15/22 04:23 07/15/22 08:17  Glucose-Capillary 70 - 99 mg/dL 138 (H) 221 (H) 238 (H) 206 (H) 117 (H)  (H): Data is abnormally high  Diabetes history: DM2  Outpatient Diabetes medications:  Soliqua 100-33 60 units QD Jardiance 25 mg QD  Current orders for Inpatient glycemic control:  Novolog 0-24 units Q4H Received decadron 4 mg on 8/15  Inpatient Diabetes Program Recommendations:    Patient has a regular diet order and is eating.  Please consider:  Carb modified diet &  Novolog 0-20 units TID and 0-5 QHS.  Will continue to follow while inpatient.  Thank you, Reche Dixon, MSN, [place] Diabetes Coordinator Inpatient Diabetes Program [phone] (team pager from 8a-5p)

## 2022-07-15 NOTE — Progress Notes (Signed)
  Echocardiogram 2D Echocardiogram has been performed.  Ryan Duke 07/15/2022, 10:03 AM

## 2022-07-15 NOTE — TOC Transition Note (Signed)
Transition of Care (TOC) - CM/SW Discharge Note Marvetta Gibbons RN, BSN Transitions of Care Unit 4E- RN Case Manager See Treatment Team for direct phone #    Patient Details  Name: ELLIAS MCELREATH MRN: [id number] Date of Birth: 20-Feb-1941  Transition of Care [facility]) CM/SW Contact:  Dawayne Patricia, RN Phone Number: 07/15/2022, 11:06 AM   Clinical Narrative:    Pt s/p TAVR, from home has RW at home. Transition of Care Department [facility]) has reviewed patient and no TOC needs have been identified at this time. Pt stable for transition home today  Final next level of care: Home/Self Care Barriers to Discharge: No Barriers Identified   Patient Goals and CMS Choice     Choice offered to / list presented to : NA  Discharge Placement               Home        Discharge Plan and Services                DME Arranged: N/A DME Agency: NA       HH Arranged: NA HH Agency: NA        Social Determinants of Health (SDOH) Interventions     Readmission Risk Interventions    07/15/2022   11:05 AM  Readmission Risk Prevention Plan  Post Dischage Appt Complete  Medication Screening Complete  Transportation Screening Complete

## 2022-07-16 ENCOUNTER — Telehealth: Payer: Self-pay | Admitting: Physician Assistant

## 2022-07-16 LAB — ECHOCARDIOGRAM COMPLETE
AR max vel: 2.69 cm2
AV Area VTI: 2.83 cm2
AV Area mean vel: 2.76 cm2
AV Mean grad: 13 mmHg
AV Peak grad: 24.8 mmHg
Ao pk vel: 2.49 m/s
Area-P 1/2: 2.42 cm2
S' Lateral: 3.2 cm
Weight: 3682.56 oz

## 2022-07-16 NOTE — Telephone Encounter (Signed)
  [facility] VALVE TEAM   Patient contacted regarding discharge from [facility] on 07/15/22  Patient understands to follow up with a structural heart APP on 8/25 at [place].  Patient understands discharge instructions? yes Patient understands medications and regimen? yes Patient understands to bring all medications to this visit? Yes  Angelena Form PA-C  [facility]

## 2022-07-22 ENCOUNTER — Ambulatory Visit: Payer: Self-pay | Admitting: Licensed Clinical Social Worker

## 2022-07-22 NOTE — Patient Outreach (Signed)
  Care Coordination   Follow Up Visit Note   07/22/2022 Name: Ryan Duke MRN: [id number] DOB: 1941-07-12  Ryan Duke Duerson is a 81 y.o. year old male who sees Janith Lima, MD for primary care. I spoke with  Ryan Duke by phone today  What matters to the patients health and wellness today? Client declined program services at this time    Goals Addressed             This Visit's Progress    patient declined program support at this time       Care Coordination Interventions:  Active listening / Reflection utilized  Described Care Coordination program support to client Discussed client needs. He has support from his spouse. Client appreciated information; but at this time he did not feel that he needed program support    SDOH assessments and interventions completed:  No    Care Coordination Interventions Activated:  No  Care Coordination Interventions:  No, not indicated   Follow up plan: No further intervention required.   Encounter Outcome:  Pt. Visit Completed

## 2022-07-22 NOTE — Patient Instructions (Addendum)
Visit Information  Thank you for taking time to visit with me today. Please don't hesitate to contact me if I can be of assistance to you before our next scheduled telephone appointment.  Following are the goals we discussed today:   No further intervention needed at this time  Please call the care guide team at [phone] if you need to cancel or reschedule your appointment.   If you are experiencing a Mental Health or [place] or need someone to talk to, please go to [facility] [place] [phone])   Following is a copy of your full plan of care:   Care Coordination Interventions:  Active listening / Reflection utilized  Described Care Coordination program support to client Discussed client needs. He has support from his spouse. Client appreciated information; but at this time he did not feel that he needed program support   Mr. Delude was given information about Care Management services by the embedded care coordination team including:  Care Management services include personalized support from designated clinical staff supervised by his physician, including individualized plan of care and coordination with other care providers 24/7 contact phone numbers for assistance for urgent and routine care needs. The patient may stop CCM services at any time (effective at the end of the month) by phone call to the office staff.  Patient agreed to services and verbal consent obtained.   Norva Riffle.Michaelene Dutan MSW, [place] [employer] representative [facility] Care Management [phone]

## 2022-07-24 ENCOUNTER — Ambulatory Visit (INDEPENDENT_AMBULATORY_CARE_PROVIDER_SITE_OTHER): Payer: Medicare Other | Admitting: Physician Assistant

## 2022-07-24 VITALS — BP 130/70 | HR 68 | Ht 71.0 in | Wt 226.0 lb

## 2022-07-24 DIAGNOSIS — Z953 Presence of xenogenic heart valve: Secondary | ICD-10-CM | POA: Diagnosis not present

## 2022-07-24 DIAGNOSIS — N183 Chronic kidney disease, stage 3 unspecified: Secondary | ICD-10-CM | POA: Diagnosis not present

## 2022-07-24 DIAGNOSIS — I10 Essential (primary) hypertension: Secondary | ICD-10-CM

## 2022-07-24 LAB — BASIC METABOLIC PANEL
BUN/Creatinine Ratio: 23 (ref 10–24)
BUN: 36 mg/dL — ABNORMAL HIGH (ref 8–27)
CO2: 25 mmol/L (ref 20–29)
Calcium: 9.4 mg/dL (ref 8.6–10.2)
Chloride: 102 mmol/L (ref 96–106)
Creatinine, Ser: 1.55 mg/dL — ABNORMAL HIGH (ref 0.76–1.27)
Glucose: 103 mg/dL — ABNORMAL HIGH (ref 70–99)
Potassium: 4.1 mmol/L (ref 3.5–5.2)
Sodium: 142 mmol/L (ref 134–144)
eGFR: 44 mL/min/{1.73_m2} — ABNORMAL LOW (ref >59)

## 2022-07-24 MED ORDER — AMOXICILLIN 500 MG PO TABS: 2000.0000 mg | ORAL_TABLET | ORAL | 12 refills | Status: DC

## 2022-07-24 NOTE — Patient Instructions (Signed)
Medication Instructions:  Your physician has recommended you make the following change in your medication:  START: amoxicillin 2,000 mg by mouth 1 hour prior to all dental work  *If you need a refill on your cardiac medications before your next appointment, please call your pharmacy*   Lab Work: TODAY: BMP  If you have labs (blood work) drawn today and your tests are completely normal, you will receive your results only by: [place] (if you have MyChart) OR A paper copy in the mail If you have any lab test that is abnormal or we need to change your treatment, we will call you to review the results.   Testing/Procedures: NONE   Follow-Up: As scheduled At [facility], you and your health needs are our priority.  As part of our continuing mission to provide you with exceptional heart care, we have created designated Provider Care Teams.  These Care Teams include your primary Cardiologist (physician) and Advanced Practice Providers (APPs -  Physician Assistants and Nurse Practitioners) who all work together to provide you with the care you need, when you need it.    Important Information About Sugar

## 2022-07-24 NOTE — Progress Notes (Signed)
[facility] [place]                                     Cardiology Office Note:    Date:  07/24/2022   ID:  Ryan Duke, DOB 04/30/1941, MRN [id number]  PCP:  Janith Lima, MD  [facility] Cardiologist:  Sherren Mocha, MD  [facility] Electrophysiologist:  None   Referring MD: Janith Lima, MD   [facility] s/p TAVR  History of Present Illness:    Ryan Duke is a 81 y.o. 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   He has a history of aortic stenosis. His most recent echo 04/14/22 showed EF >75%, Duke aortic stenosis with a mean grad 51 mmHg, peak grad 88.4 mmHg, AVA 0.91 cm2, DVI 0.28, SVI 51, mild AI. [facility] 05/07/22 showed widely patent coronary arteries with no significant stenoses and normal right heart pressures and hemodynamics. He had some problematic teeth and underwent dental extractions on 05/14/22.  He was evaluated by the multidisciplinary valve team and underwent successful TAVR with a 26 mm Edwards Sapien 3 Ultra Resilia THV x2 (given canting of first valve) via the subclavian approach on 07/14/22. 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 Post operative echo showed EF 65%, normally functioning TAVR x2 with a mean gradient of 13 mmHg and trivial PVL.  He was started on Asprin and Plavix given double TAVR valve placement. Creat noted to be  1.62 today which is a little bit above his baseline.   Today the patient presents to clinic for follow up. Here with wife. No CP or SOB. Dyspnea has improved since TAVR.  No LE edema, orthopnea or PND. No dizziness or syncope. No blood in stool or urine. No palpitations. A little disappointed his energy isnt better after TAVR.   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 CBurt Knackand Dr. BCyndia Bent  Duke aortic stenosis     Past Surgical History:  Procedure Laterality Date   ALVEOLOPLASTY N/A 05/14/2022   Procedure: ALVEOLOPLASTY;  Surgeon: OCharlaine Dalton DMD;  [facility]  Service: Dentistry;  Laterality: N/A;   CARDIAC CATHETERIZATION     EYE SURGERY  2021   cataracts   FLEXIBLE SIGMOIDOSCOPY N/A 09/10/2017   Procedure: FLEXIBLE SIGMOIDOSCOPY;  Surgeon: NMauri Pole MD;  Location: [facility];  Service: Endoscopy;  Laterality: N/A;   IR KYPHO LUMBAR INC FX REDUCE BONE BX UNI/BIL CANNULATION INC/IMAGING  09/14/2017   IR RADIOLOGIST EVAL & MGMT  10/07/2017   RIGHT HEART CATH AND CORONARY ANGIOGRAPHY N/A 05/07/2022   Procedure: RIGHT HEART CATH AND CORONARY ANGIOGRAPHY;  Surgeon: CSherren Mocha MD;  [facility];  Service: Cardiovascular;  Laterality: N/A;   TEE WITHOUT CARDIOVERSION N/A 07/14/2022   Procedure: TRANSESOPHAGEAL ECHOCARDIOGRAM (TEE);  Surgeon: CSherren Mocha MD;  [facility];  Service: Open Heart Surgery;  Laterality: N/A;   TONSILLECTOMY     TOOTH EXTRACTION N/A 05/14/2022   Procedure: DENTAL EXTRACTIONS TEETH NUMBER TWO, THREE, FOUR, FIVE, FOURTEEN, FIFTEEN, TWENTY;  Surgeon: Charlaine Dalton, DMD;  Location: [place];  Service: Dentistry;  Laterality: N/A;    Current Medications: Current Meds  Medication Sig   amoxicillin (AMOXIL) 500 MG tablet Take 4 tablets (2,000 mg total) by mouth as directed. 1 hour prior to dental work including cleanings   aspirin 81 MG chewable tablet Chew 1 tablet (81 mg total) by mouth daily.   atenolol-chlorthalidone (TENORETIC) 100-25 MG tablet TAKE ONE TABLET BY MOUTH ONCE DAILY   clopidogrel (PLAVIX) 75 MG tablet Take 1 tablet (75 mg total) by  mouth daily with breakfast.   Continuous Blood Gluc Receiver (FREESTYLE LIBRE 2 READER) DEVI 1 Act by Does not apply route daily.   Continuous Blood Gluc Sensor (FREESTYLE LIBRE 2 SENSOR) MISC USE AS DIRECTED AND REPLACE EVERY 14 DAYS.   DULoxetine (CYMBALTA) 60 MG capsule TAKE ONE CAPSULE DAILY   fluticasone (FLONASE) 50 MCG/ACT nasal spray Place 1 spray into both nostrils daily as needed for allergies or rhinitis.   glucagon (GLUCAGEN HYPOKIT) 1 MG SOLR injection Inject 1 mg into the vein once as needed for up to 1 dose for low blood sugar.   Insulin Glargine-Lixisenatide (SOLIQUA) 100-33 UNT-MCG/ML SOPN Inject 60 Units into the skin daily.   Insulin Pen Needle (NOVOFINE) 32G X 6 MM MISC 1 Act by Does not apply route daily.   JARDIANCE 25 MG TABS tablet TAKE ONE TABLET ONCE DAILY BEFORE BREAKFAST.   KERENDIA 20 MG TABS Take 1 tablet by mouth daily.   losartan (COZAAR) 100 MG tablet Take 1 tablet (100 mg total) by mouth daily. for high blood pressure   Multiple Vitamin (MULTIVITAMIN WITH MINERALS) TABS tablet Take 1 tablet by mouth daily.   Multiple Vitamins-Minerals (PRESERVISION AREDS PO) Take 1 tablet by mouth in the morning and at bedtime.   MYRBETRIQ 50 MG TB24 tablet TAKE 1 TABLET ONCE DAILY.   pravastatin (PRAVACHOL) 40 MG tablet TAKE ONE TABLET BY MOUTH ONCE DAILY.   pregabalin (LYRICA) 150 MG capsule Take 150 mg by mouth See admin instructions. Take 150 mg daily, may take an additional 150 mg twice daily as needed for pain   tamsulosin (FLOMAX) 0.4 MG CAPS capsule TAKE ONE CAPSULE BY MOUTH DAILY   vitamin B-12 (CYANOCOBALAMIN) 500 MCG tablet Take 500 mcg by mouth daily.     Allergies:   Farxiga [dapagliflozin], Metformin and related, Atorvastatin, Benazepril hcl, Flexeril [cyclobenzaprine], and Oxycodone   Social History   Socioeconomic History   Marital status: Married    Spouse name: Not on file   Number of children: Not on file   Years of education: Not on file   Highest  education level: Not on file  Occupational History   Occupation: retired  Tobacco Use   Smoking status: Former    Types: Cigarettes    Quit date: 11/30/1974    Years since quitting: 47.6    Passive exposure: Never   Smokeless tobacco: Never  Vaping Use   Vaping Use: Never used  Substance and Sexual Activity   Alcohol use: Not Currently    Alcohol/week: 0.0 standard drinks of alcohol   Drug use: No   Sexual activity: Yes  Other Topics Concern   Not on file  Social History Narrative   Regular exercise-yes   Social Determinants of Health   Financial Resource Strain: Low Risk  (  03/02/2022)   Overall Financial Resource Strain (CARDIA)    Difficulty of Paying Living Expenses: Not hard at all  Food Insecurity: No Food Insecurity (03/02/2022)   Hunger Vital Sign    Worried About Running Out of Food in the Last Year: Never true    Ran Out of Food in the Last Year: Never true  Transportation Needs: No Transportation Needs (03/02/2022)   PRAPARE - Hydrologist (Medical): No    Lack of Transportation (Non-Medical): No  Physical Activity: Insufficiently Active (03/02/2022)   Exercise Vital Sign    Days of Exercise per Week: 2 days    Minutes of Exercise per Session: 20 min  Stress: No Stress Concern Present (03/02/2022)   [place]    Feeling of Stress : Not at all  Social Connections: Moderately Isolated (03/02/2022)   Social Connection and Isolation Panel [NHANES]    Frequency of Communication with Friends and Family: Twice a week    Frequency of Social Gatherings with Friends and Family: Twice a week    Attends Religious Services: Never    Marine scientist or Organizations: No    Attends Music therapist: Never    Marital Status: Married     Family History: The patient's family history includes Hypertension in an other family member.  ROS:   Please see the history of  present illness.    All other systems reviewed and are negative.  EKGs/Labs/Other Studies Reviewed:    The following studies were reviewed today:  TAVR OPERATIVE NOTE     Date of Procedure:                07/14/2022   Preoperative Diagnosis:      Duke Aortic Stenosis    Postoperative Diagnosis:    Same    Procedure:        Transcatheter Aortic Valve Replacement - Left Subclavian Artery Approach             Edwards Sapien 3 Ultra Resilia THV (size 26 mm, model # 9755RSL, serial # [id number])              Co-Surgeons:                        [name], MD and Sherren Mocha, MD   Anesthesiologist:                  Rodell Perna, MD   Echocardiographer:              Viona Gilmore. O'Neal, MD   Pre-operative Echo Findings: Duke aortic stenosis Normal left ventricular systolic function   Post-operative Echo Findings: Mild  paravalvular leak Normal left ventricular systolic function _____________     Echo 07/15/22: IMPRESSIONS   1. S/p 26 mm S3 x 2 TAVR (ViV performed immediately during the procedure  due to paravalvular leak). Vmax 2.5 m/s, MG 13 mmHG, EOA 2.83 cm2, DI  0.68. Trivial paravalvular leak noted. The aortic valve has been  repaired/replaced. Aortic valve regurgitation   is trivial. There is a 26 mm Edwards Sapien prosthetic (TAVR) valve  present in the aortic position. Procedure Date: 07/14/22. Echo findings are  consistent with normal structure and function of the aortic valve  prosthesis.   2. Left ventricular ejection fraction, by estimation, is 65 to 70%. The  left ventricle has normal function. The left ventricle has no  regional  wall motion abnormalities. There is moderate concentric left ventricular  hypertrophy. Left ventricular  diastolic parameters are consistent with Grade I diastolic dysfunction  (impaired relaxation).   3. Right ventricular systolic function is normal. The right ventricular  size is normal. Tricuspid regurgitation signal is inadequate for  assessing  PA pressure.   4. Left atrial size was moderately dilated.   5. The mitral valve is grossly normal. Trivial mitral valve  regurgitation. No evidence of mitral stenosis.   6. The inferior vena cava is normal in size with greater than 50%  respiratory variability, suggesting right atrial pressure of 3 mmHg.    EKG:  EKG is ordered today.  The ekg ordered today demonstrates sinus with 1st deg AV block , PACS and LAFB. HR 68 bpm  Recent Labs: 12/04/2021: TSH 2.96 07/10/2022: ALT 16 07/15/2022: BUN 27; Creatinine, Ser 1.62; Hemoglobin 13.4; Magnesium 2.0; Platelets 177; Potassium 3.5; Sodium 134  Recent Lipid Panel    Component Value Date/Time   CHOL 152 12/04/2021 1041   TRIG 96.0 12/04/2021 1041   TRIG 171 (H) 10/06/2006 0741   HDL 40.20 12/04/2021 1041   CHOLHDL 4 12/04/2021 1041   VLDL 19.2 12/04/2021 1041   LDLCALC 93 12/04/2021 1041   LDLDIRECT 81.0 05/26/2018 1547     Risk Assessment/Calculations:       Physical Exam:    VS:  BP 130/70   Pulse 68   Ht '5\' 11"'$  (1.803 m)   Wt 226 lb (102.5 kg)   SpO2 96%   BMI 31.52 kg/m     Wt Readings from Last 3 Encounters:  07/24/22 226 lb (102.5 kg)  07/15/22 230 lb 2.6 oz (104.4 kg)  07/10/22 230 lb 6.4 oz (104.5 kg)     GEN:  Well nourished, well developed in no acute distress HEENT: Normal NECK: No JVD LYMPHATICS: No lymphadenopathy CARDIAC: RRR, soft flow murmur @ RUSB. No rubs, gallops RESPIRATORY:  Clear to auscultation without rales, wheezing or rhonchi  ABDOMEN: Soft, non-tender, non-distended MUSCULOSKELETAL:  No edema; No deformity  SKIN: Warm and dry.  Groin/subclavian sites clear without hematoma or ecchymosis  NEUROLOGIC:  Alert and oriented x 3 PSYCHIATRIC:  Normal affect   ASSESSMENT:    1. S/p TAVR (transcatheter aortic valve replacement), bioprosthetic   2. Essential hypertension   3. Stage 3 chronic kidney disease, unspecified whether stage 3a or 3b CKD (HCC)    PLAN:    In order of  problems listed above:  Duke AS s/p TAVR: groin/subclavian site healing well. 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 Continue on DAPT with aspirin x 82month followed by Asprin alone. SBE prophylaxis discussed; I have RX'd amoxicillin. Encouraged exercise for energy. He is anxious to start CRPII. I will see him back for 1 month follow up and echo.   HTN: BP well controlled today. No changes made.   CKD stage IIIb: creat 1.62 at discharge. Recheck today.       Cardiac Rehabilitation Eligibility Assessment  The patient is ready to start cardiac rehabilitation from a cardiac standpoint.     Medication Adjustments/Labs and Tests Ordered: Current medicines are reviewed at length with the patient today.  Concerns regarding medicines are outlined above.  Orders Placed This Encounter  Procedures   Basic metabolic panel   EKG 185-IDPO  Meds ordered this encounter  Medications   amoxicillin (AMOXIL) 500 MG tablet    Sig: Take 4 tablets (2,000 mg total) by mouth as  directed. 1 hour prior to dental work including cleanings    Dispense:  12 tablet    Refill:  12    Order Specific Question:   Supervising Provider    Answer:   [place], [place]    Patient Instructions  Medication Instructions:  Your physician has recommended you make the following change in your medication:  START: amoxicillin 2,000 mg by mouth 1 hour prior to all dental work  *If you need a refill on your cardiac medications before your next appointment, please call your pharmacy*   Lab Work: TODAY: BMP  If you have labs (blood work) drawn today and your tests are completely normal, you will receive your results only by: [place] (if you have MyChart) OR A paper copy in the mail If you have any lab test that is abnormal or we need to change your treatment, we will call you to review the results.   Testing/Procedures: NONE   Follow-Up: As scheduled At [facility], you and your health  needs are our priority.  As part of our continuing mission to provide you with exceptional heart care, we have created designated Provider Care Teams.  These Care Teams include your primary Cardiologist (physician) and Advanced Practice Providers (APPs -  Physician Assistants and Nurse Practitioners) who all work together to provide you with the care you need, when you need it.    Important Information About Sugar         Signed, Angelena Form, PA-C  07/24/2022 11:37 AM    [facility]

## 2022-08-04 ENCOUNTER — Other Ambulatory Visit: Payer: Self-pay | Admitting: Internal Medicine

## 2022-08-04 DIAGNOSIS — E118 Type 2 diabetes mellitus with unspecified complications: Secondary | ICD-10-CM

## 2022-08-10 ENCOUNTER — Other Ambulatory Visit: Payer: Self-pay | Admitting: Internal Medicine

## 2022-08-10 ENCOUNTER — Telehealth (HOSPITAL_COMMUNITY): Payer: Self-pay | Admitting: *Deleted

## 2022-08-10 DIAGNOSIS — N1831 Chronic kidney disease, stage 3a: Secondary | ICD-10-CM

## 2022-08-10 DIAGNOSIS — E118 Type 2 diabetes mellitus with unspecified complications: Secondary | ICD-10-CM

## 2022-08-10 NOTE — Telephone Encounter (Signed)
Called patient to confirm appointment for tomorrow. Ryan Duke said he will not be able to come tomorrow and will call when he is ready to reschedule.Barnet Pall, RN,BSN 08/10/2022 2:26 PM

## 2022-08-11 ENCOUNTER — Inpatient Hospital Stay (HOSPITAL_COMMUNITY): Admission: RE | Admit: 2022-08-11 | Payer: Medicare Other | Source: Ambulatory Visit

## 2022-08-17 ENCOUNTER — Other Ambulatory Visit: Payer: Self-pay | Admitting: Internal Medicine

## 2022-08-17 ENCOUNTER — Ambulatory Visit (HOSPITAL_COMMUNITY): Payer: Medicare Other

## 2022-08-17 NOTE — Progress Notes (Addendum)
HEART AND [place]                                     Cardiology Office Note:    Date:  08/20/2022   ID:  DOIL KAMARA, DOB 1941-05-09, MRN [id number]  PCP:  Janith Lima, MD  [facility] Cardiologist:  Sherren Mocha, MD  [facility] Electrophysiologist:  None   Referring MD: Janith Lima, MD   1 month s/p TAVR  History of Present Illness:    HANLEY WOERNER is a 81 y.o. 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   He has a history of aortic stenosis. His most recent echo 04/14/22 showed EF >75%, severe aortic stenosis with a mean grad 51 mmHg, peak grad 88.4 mmHg, AVA 0.91 cm2, DVI 0.28, SVI 51, mild AI. [facility] 05/07/22 showed widely patent coronary arteries with no significant stenoses and normal right heart pressures and hemodynamics. He had some problematic teeth and underwent dental extractions on 05/14/22.  He was evaluated by the multidisciplinary valve team and underwent successful TAVR with a 26 mm Edwards Sapien 3 Ultra Resilia THV x2 (given canting of first valve) via the subclavian approach on 07/14/22. 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 Post operative echo showed EF 65%, normally functioning TAVR x2 with a mean gradient of 13 mmHg and trivial PVL.  He was started on Asprin and Plavix given double TAVR valve placement. Creat noted to bump to 1.62 which is a little bit above his baseline. Repeat BMET showed creat down to baseline at 1.55.  Today the patient presents to clinic for follow up. No CP or SOB. No LE edema, orthopnea or PND. No dizziness or syncope. No blood in stool or urine. No palpitations.    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 CBurt Knackand Dr. BCyndia Bent  Severe aortic stenosis     Past Surgical History:  Procedure Laterality Date   ALVEOLOPLASTY N/A 05/14/2022   Procedure: ALVEOLOPLASTY;  Surgeon: OCharlaine Dalton DMD;  [place] [facility]  Service: Dentistry;  Laterality: N/A;   CARDIAC CATHETERIZATION     EYE SURGERY  2021   cataracts   FLEXIBLE SIGMOIDOSCOPY N/A 09/10/2017   Procedure: FLEXIBLE SIGMOIDOSCOPY;  Surgeon: NMauri Pole MD;  Location: [facility];  Service: Endoscopy;  Laterality: N/A;   IR KYPHO LUMBAR INC FX REDUCE BONE BX UNI/BIL CANNULATION INC/IMAGING  09/14/2017   IR RADIOLOGIST EVAL & MGMT  10/07/2017   RIGHT HEART CATH AND CORONARY ANGIOGRAPHY N/A 05/07/2022   Procedure: RIGHT HEART CATH AND CORONARY ANGIOGRAPHY;  Surgeon: CSherren Mocha MD;  [place] [facility];  Service: Cardiovascular;  Laterality: N/A;   TEE WITHOUT CARDIOVERSION N/A 07/14/2022   Procedure: TRANSESOPHAGEAL ECHOCARDIOGRAM (TEE);  Surgeon: CSherren Mocha MD;  [place] [facility];  Service: Open Heart Surgery;  Laterality: N/A;   TONSILLECTOMY  TOOTH EXTRACTION N/A 05/14/2022   Procedure: DENTAL EXTRACTIONS TEETH NUMBER TWO, THREE, FOUR, FIVE, FOURTEEN, FIFTEEN, TWENTY;  Surgeon: Charlaine Dalton, DMD;  Location: [place];  Service: Dentistry;  Laterality: N/A;    Current Medications: Current Meds  Medication Sig   amoxicillin (AMOXIL) 500 MG tablet Take 4 tablets (2,000 mg total) by mouth as directed. 1 hour prior to dental work including cleanings   aspirin 81 MG chewable tablet Chew 1 tablet (81 mg total) by mouth daily.   atenolol-chlorthalidone (TENORETIC) 100-25 MG tablet TAKE ONE TABLET BY MOUTH ONCE DAILY   clopidogrel (PLAVIX) 75 MG tablet Take 1 tablet (75 mg total) by mouth daily with breakfast.   Continuous Blood Gluc Receiver  (FREESTYLE LIBRE 2 READER) DEVI USE AS DIRECTED.   Continuous Blood Gluc Sensor (FREESTYLE LIBRE 2 SENSOR) MISC USE AS DIRECTED AND REPLACE EVERY 14 DAYS.   DULoxetine (CYMBALTA) 60 MG capsule TAKE ONE CAPSULE DAILY   fluticasone (FLONASE) 50 MCG/ACT nasal spray Place 1 spray into both nostrils daily as needed for allergies or rhinitis.   Insulin Glargine-Lixisenatide (SOLIQUA) 100-33 UNT-MCG/ML SOPN Inject 60 Units into the skin daily.   Insulin Pen Needle (NOVOFINE) 32G X 6 MM MISC 1 Act by Does not apply route daily.   JARDIANCE 25 MG TABS tablet TAKE ONE TABLET ONCE DAILY BEFORE BREAKFAST.   KERENDIA 20 MG TABS Take 1 tablet by mouth daily.   losartan (COZAAR) 100 MG tablet Take 1 tablet (100 mg total) by mouth daily. for high blood pressure   Multiple Vitamin (MULTIVITAMIN WITH MINERALS) TABS tablet Take 1 tablet by mouth daily.   Multiple Vitamins-Minerals (PRESERVISION AREDS PO) Take 1 tablet by mouth in the morning and at bedtime.   MYRBETRIQ 50 MG TB24 tablet TAKE 1 TABLET ONCE DAILY.   pravastatin (PRAVACHOL) 40 MG tablet TAKE ONE TABLET BY MOUTH ONCE DAILY   pregabalin (LYRICA) 150 MG capsule Take 150 mg by mouth See admin instructions. Take 150 mg daily, may take an additional 150 mg twice daily as needed for pain   tamsulosin (FLOMAX) 0.4 MG CAPS capsule TAKE ONE CAPSULE BY MOUTH DAILY   vitamin B-12 (CYANOCOBALAMIN) 500 MCG tablet Take 500 mcg by mouth daily.     Allergies:   Farxiga [dapagliflozin], Metformin and related, Atorvastatin, Benazepril hcl, Flexeril [cyclobenzaprine], and Oxycodone   Social History   Socioeconomic History   Marital status: Married    Spouse name: Not on file   Number of children: Not on file   Years of education: Not on file   Highest education level: Not on file  Occupational History   Occupation: retired  Tobacco Use   Smoking status: Former    Types: Cigarettes    Quit date: 11/30/1974    Years since quitting: 47.7    Passive exposure:  Never   Smokeless tobacco: Never  Vaping Use   Vaping Use: Never used  Substance and Sexual Activity   Alcohol use: Not Currently    Alcohol/week: 0.0 standard drinks of alcohol   Drug use: No   Sexual activity: Yes  Other Topics Concern   Not on file  Social History Narrative   Regular exercise-yes   Social Determinants of Health   Financial Resource Strain: Low Risk  (03/02/2022)   Overall Financial Resource Strain (CARDIA)    Difficulty of Paying Living Expenses: Not hard at all  Food Insecurity: No Food Insecurity (03/02/2022)   Hunger Vital Sign    Worried About Running Out of  Food in the Last Year: Never true    [place] in the Last Year: Never true  Transportation Needs: No Transportation Needs (03/02/2022)   PRAPARE - Hydrologist (Medical): No    Lack of Transportation (Non-Medical): No  Physical Activity: Insufficiently Active (03/02/2022)   Exercise Vital Sign    Days of Exercise per Week: 2 days    Minutes of Exercise per Session: 20 min  Stress: No Stress Concern Present (03/02/2022)   [place]    Feeling of Stress : Not at all  Social Connections: Moderately Isolated (03/02/2022)   Social Connection and Isolation Panel [NHANES]    Frequency of Communication with Friends and Family: Twice a week    Frequency of Social Gatherings with Friends and Family: Twice a week    Attends Religious Services: Never    Marine scientist or Organizations: No    Attends Music therapist: Never    Marital Status: Married     Family History: The patient's family history includes Hypertension in an other family member.  ROS:   Please see the history of present illness.    All other systems reviewed and are negative.  EKGs/Labs/Other Studies Reviewed:    The following studies were reviewed today:  TAVR OPERATIVE NOTE     Date of Procedure:                 07/14/2022   Preoperative Diagnosis:      Severe Aortic Stenosis    Postoperative Diagnosis:    Same    Procedure:        Transcatheter Aortic Valve Replacement - Left Subclavian Artery Approach             Edwards Sapien 3 Ultra Resilia THV (size 26 mm, model # 9755RSL, serial # [id number])              Co-Surgeons:                        [name], MD and Sherren Mocha, MD   Anesthesiologist:                  Rodell Perna, MD   Echocardiographer:              Viona Gilmore. O'Neal, MD   Pre-operative Echo Findings: Severe aortic stenosis Normal left ventricular systolic function   Post-operative Echo Findings: Mild  paravalvular leak Normal left ventricular systolic function _____________     Echo 07/15/22: IMPRESSIONS   1. S/p 26 mm S3 x 2 TAVR (ViV performed immediately during the procedure  due to paravalvular leak). Vmax 2.5 m/s, MG 13 mmHG, EOA 2.83 cm2, DI  0.68. Trivial paravalvular leak noted. The aortic valve has been  repaired/replaced. Aortic valve regurgitation   is trivial. There is a 26 mm Edwards Sapien prosthetic (TAVR) valve  present in the aortic position. Procedure Date: 07/14/22. Echo findings are  consistent with normal structure and function of the aortic valve  prosthesis.   2. Left ventricular ejection fraction, by estimation, is 65 to 70%. The  left ventricle has normal function. The left ventricle has no regional  wall motion abnormalities. There is moderate concentric left ventricular  hypertrophy. Left ventricular  diastolic parameters are consistent with Grade I diastolic dysfunction  (impaired relaxation).   3. Right ventricular systolic function is normal. The right ventricular  size is normal. Tricuspid regurgitation signal is inadequate for assessing  PA pressure.   4. Left atrial size was moderately dilated.   5. The mitral valve is grossly normal. Trivial mitral valve  regurgitation. No evidence of mitral stenosis.   6. The inferior vena cava is  normal in size with greater than 50%  respiratory variability, suggesting right atrial pressure of 3 mmHg.   ___________________  Echo 08/19/22 IMPRESSIONS  1. Left ventricular ejection fraction, by estimation, is 55 to 60%. The left ventricle has normal function. The left ventricle has no regional wall motion abnormalities. There is mild left ventricular hypertrophy. Left ventricular diastolic parameters  are consistent with Grade I diastolic dysfunction (impaired relaxation).  2. Right ventricular systolic function is normal. The right ventricular size is normal. Tricuspid regurgitation signal is inadequate for assessing PA pressure.  3. The mitral valve is normal in structure. No evidence of mitral valve regurgitation. No evidence of mitral stenosis.  4. Bioprosthetic aortic valve s/p TAVR. 26 mm Edwards Sapien 3 Ultra Resilia THV. Mean gradient 9 mmHg, EOA 1.94 cm^2. Mild perivalvular regurgitation.  5. Aortic dilatation noted. There is mild dilatation of the ascending aorta, measuring 39 mm.  6. The inferior vena cava is normal in size with greater than 50% respiratory variability, suggesting right atrial pressure of 3 mmHg.    EKG:  EKG is NOT ordered today.    Recent Labs: 12/04/2021: TSH 2.96 07/10/2022: ALT 16 07/15/2022: Hemoglobin 13.4; Magnesium 2.0; Platelets 177 07/24/2022: BUN 36; Creatinine, Ser 1.55; Potassium 4.1; Sodium 142  Recent Lipid Panel    Component Value Date/Time   CHOL 152 12/04/2021 1041   TRIG 96.0 12/04/2021 1041   TRIG 171 (H) 10/06/2006 0741   HDL 40.20 12/04/2021 1041   CHOLHDL 4 12/04/2021 1041   VLDL 19.2 12/04/2021 1041   LDLCALC 93 12/04/2021 1041   LDLDIRECT 81.0 05/26/2018 1547     Risk Assessment/Calculations:       Physical Exam:    VS:  BP 122/72   Pulse (!) 54   Ht '5\' 11"'$  (1.803 m)   Wt 229 lb (103.9 kg)   SpO2 94%   BMI 31.94 kg/m     Wt Readings from Last 3 Encounters:  08/19/22 229 lb (103.9 kg)  07/24/22 226 lb (102.5 kg)   07/15/22 230 lb 2.6 oz (104.4 kg)     GEN:  Well nourished, well developed in no acute distress HEENT: Normal NECK: No JVD LYMPHATICS: No lymphadenopathy CARDIAC: RRR, soft flow murmur @ RUSB. No rubs, gallops RESPIRATORY:  Clear to auscultation without rales, wheezing or rhonchi  ABDOMEN: Soft, non-tender, non-distended MUSCULOSKELETAL:  No edema; No deformity  SKIN: Warm and dry.  NEUROLOGIC:  Alert and oriented x 3 PSYCHIATRIC:  Normal affect   ASSESSMENT:    1. S/p TAVR (transcatheter aortic valve replacement), bioprosthetic   2. Essential hypertension   3. Stage 3 chronic kidney disease, unspecified whether stage 3a or 3b CKD (HCC)     PLAN:    In order of problems listed above:  Severe AS s/p TAVR: echo today shows EF 65%, normally functioning TAVR with a mean gradient of 9 mm hg and mild PVL. He has NYHA class II symptoms, mostly of fatigue. Continue on DAPT with aspirin x 85month followed by Asprin alone (placed on DAPT given TAVR in TAVR placement- see HPI). SBE prophylaxis discussed; I have RX'd amoxicillin. Encouraged exercise for energy. He is anxious to start CRPII. I will see him back  for 1 year follow up and echo.   HTN: BP well controlled today. No changes made.   CKD stage IIIb: creat 1.55 last check which is around his baseline.   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 Recommend follow-up ultrasound every 5 years. Will defer to primary cardiologist.    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 No overtly suspicious CT features. MRI abdomen without and with IV contrast may be considered for further evaluation. Will defer to PCP.    Pulmonary nodules: a few scattered small solid pulmonary nodules, largest 0.4 cm. No follow-up needed if patient is low-risk (and has no known or  suspected primary neoplasm). Non-contrast chest CT can be considered in 12 months if patient is high-risk. I do not think he is high risk but he is a former smoker, so we will get this set up at the 1 year visit.    Medication Adjustments/Labs and Tests Ordered: Current medicines are reviewed at length with the patient today.  Concerns regarding medicines are outlined above.  Orders Placed This Encounter  Procedures   ECHOCARDIOGRAM COMPLETE   No orders of the defined types were placed in this encounter.   Patient Instructions  Medication Instructions:  Your physician recommends that you continue on your current medications as directed. Please refer to the Current Medication list given to you today.  *If you need a refill on your cardiac medications before your next appointment, please call your pharmacy*   Lab Work: NONE If you have labs (blood work) drawn today and your tests are completely normal, you will receive your results only by: [place] (if you have MyChart) OR A paper copy in the mail If you have any lab test that is abnormal or we need to change your treatment, we will call you to review the results.   Testing/Procedures: Your physician has requested that you have an echocardiogram. Echocardiography is a painless test that uses sound waves to create images of your heart. It provides your doctor with information about the size and shape of your heart and how well your heart's chambers and valves are working. This procedure takes approximately one hour. There are no restrictions for this procedure.    Follow-Up: At [facility], you and your health needs are our priority.  As part of our continuing mission to provide you with exceptional heart care, we have created designated Provider Care Teams.  These Care Teams include your primary Cardiologist (physician) and Advanced Practice Providers (APPs -  Physician Assistants and Nurse Practitioners) who all work  together to provide you with the care you need, when you need it.  We recommend signing up for the patient portal called "MyChart".  Sign up information is provided on this After Visit Summary.  MyChart is used to connect with patients for Virtual Visits (Telemedicine).  Patients are able to view lab/test results, encounter notes, upcoming appointments, etc.  Non-urgent messages can be sent to your provider as well.   To learn more about what you can do with MyChart, go to [email or web address].    Your next appointment:   KEEP SCHEDULED APPOINTMENT   Important Information About Sugar         Weston Brass Angelena Form, PA-C  08/20/2022 8:42 AM    [place]

## 2022-08-19 ENCOUNTER — Ambulatory Visit (HOSPITAL_COMMUNITY): Payer: Medicare Other | Attending: Cardiology

## 2022-08-19 ENCOUNTER — Ambulatory Visit: Payer: Medicare Other | Admitting: Physician Assistant

## 2022-08-19 ENCOUNTER — Ambulatory Visit (HOSPITAL_COMMUNITY): Payer: Medicare Other

## 2022-08-19 ENCOUNTER — Other Ambulatory Visit: Payer: Self-pay | Admitting: Physician Assistant

## 2022-08-19 VITALS — BP 122/72 | HR 54 | Ht 71.0 in | Wt 229.0 lb

## 2022-08-19 DIAGNOSIS — Z952 Presence of prosthetic heart valve: Secondary | ICD-10-CM

## 2022-08-19 DIAGNOSIS — I10 Essential (primary) hypertension: Secondary | ICD-10-CM

## 2022-08-19 DIAGNOSIS — Z953 Presence of xenogenic heart valve: Secondary | ICD-10-CM | POA: Diagnosis not present

## 2022-08-19 DIAGNOSIS — N183 Chronic kidney disease, stage 3 unspecified: Secondary | ICD-10-CM | POA: Insufficient documentation

## 2022-08-19 DIAGNOSIS — I714 Abdominal aortic aneurysm, without rupture, unspecified: Secondary | ICD-10-CM | POA: Diagnosis not present

## 2022-08-19 LAB — ECHOCARDIOGRAM COMPLETE
AR max vel: 1.75 cm2
AV Area VTI: 1.94 cm2
AV Area mean vel: 1.73 cm2
AV Mean grad: 9 mmHg
AV Peak grad: 17.6 mmHg
Ao pk vel: 2.1 m/s
Area-P 1/2: 2.21 cm2
P 1/2 time: 417 msec
S' Lateral: 3.5 cm

## 2022-08-19 MED ORDER — PERFLUTREN LIPID MICROSPHERE
1.0000 mL | INTRAVENOUS | Status: AC | PRN
  Administered 2022-08-19: 2 mL via INTRAVENOUS

## 2022-08-19 NOTE — Patient Instructions (Signed)
Medication Instructions:  Your physician recommends that you continue on your current medications as directed. Please refer to the Current Medication list given to you today.  *If you need a refill on your cardiac medications before your next appointment, please call your pharmacy*   Lab Work: NONE If you have labs (blood work) drawn today and your tests are completely normal, you will receive your results only by: [place] (if you have MyChart) OR A paper copy in the mail If you have any lab test that is abnormal or we need to change your treatment, we will call you to review the results.   Testing/Procedures: Your physician has requested that you have an echocardiogram. Echocardiography is a painless test that uses sound waves to create images of your heart. It provides your doctor with information about the size and shape of your heart and how well your heart's chambers and valves are working. This procedure takes approximately one hour. There are no restrictions for this procedure.    Follow-Up: At [facility], you and your health needs are our priority.  As part of our continuing mission to provide you with exceptional heart care, we have created designated Provider Care Teams.  These Care Teams include your primary Cardiologist (physician) and Advanced Practice Providers (APPs -  Physician Assistants and Nurse Practitioners) who all work together to provide you with the care you need, when you need it.  We recommend signing up for the patient portal called "MyChart".  Sign up information is provided on this After Visit Summary.  MyChart is used to connect with patients for Virtual Visits (Telemedicine).  Patients are able to view lab/test results, encounter notes, upcoming appointments, etc.  Non-urgent messages can be sent to your provider as well.   To learn more about what you can do with MyChart, go to [email or web address].    Your next appointment:   KEEP  SCHEDULED APPOINTMENT   Important Information About Sugar

## 2022-08-21 ENCOUNTER — Ambulatory Visit (HOSPITAL_COMMUNITY): Payer: Medicare Other

## 2022-08-21 DIAGNOSIS — L821 Other seborrheic keratosis: Secondary | ICD-10-CM | POA: Diagnosis not present

## 2022-08-21 DIAGNOSIS — D225 Melanocytic nevi of trunk: Secondary | ICD-10-CM | POA: Diagnosis not present

## 2022-08-21 DIAGNOSIS — Z08 Encounter for follow-up examination after completed treatment for malignant neoplasm: Secondary | ICD-10-CM | POA: Diagnosis not present

## 2022-08-21 DIAGNOSIS — L814 Other melanin hyperpigmentation: Secondary | ICD-10-CM | POA: Diagnosis not present

## 2022-08-24 ENCOUNTER — Ambulatory Visit (HOSPITAL_COMMUNITY): Payer: Medicare Other

## 2022-08-26 ENCOUNTER — Ambulatory Visit (HOSPITAL_COMMUNITY): Payer: Medicare Other

## 2022-08-28 ENCOUNTER — Ambulatory Visit (HOSPITAL_COMMUNITY): Payer: Medicare Other

## 2022-08-31 ENCOUNTER — Ambulatory Visit (HOSPITAL_COMMUNITY): Payer: Medicare Other

## 2022-09-02 ENCOUNTER — Telehealth (HOSPITAL_COMMUNITY): Payer: Self-pay

## 2022-09-02 ENCOUNTER — Ambulatory Visit (HOSPITAL_COMMUNITY): Payer: Medicare Other

## 2022-09-02 NOTE — Telephone Encounter (Signed)
Patient called and was interested in rescheduling for the Cardiac Rehab Program. Patient will come in for orientation on 09/03/22'@1'$ :15pm and will attend the 10:15am exercise class

## 2022-09-03 ENCOUNTER — Encounter (HOSPITAL_COMMUNITY): Payer: Self-pay

## 2022-09-03 ENCOUNTER — Encounter (HOSPITAL_COMMUNITY)
Admission: RE | Admit: 2022-09-03 | Discharge: 2022-09-03 | Disposition: A | Discharge status: Home / Self Care | Payer: Medicare Other | Source: Ambulatory Visit | Attending: Cardiovascular Disease | Admitting: Cardiovascular Disease

## 2022-09-03 VITALS — BP 118/78 | HR 77 | Ht 67.75 in | Wt 236.3 lb

## 2022-09-03 DIAGNOSIS — Z952 Presence of prosthetic heart valve: Secondary | ICD-10-CM | POA: Insufficient documentation

## 2022-09-03 DIAGNOSIS — Z48812 Encounter for surgical aftercare following surgery on the circulatory system: Secondary | ICD-10-CM | POA: Insufficient documentation

## 2022-09-03 DIAGNOSIS — Z794 Long term (current) use of insulin: Secondary | ICD-10-CM | POA: Insufficient documentation

## 2022-09-03 DIAGNOSIS — E1122 Type 2 diabetes mellitus with diabetic chronic kidney disease: Secondary | ICD-10-CM | POA: Insufficient documentation

## 2022-09-03 DIAGNOSIS — E785 Hyperlipidemia, unspecified: Secondary | ICD-10-CM | POA: Insufficient documentation

## 2022-09-03 DIAGNOSIS — N1831 Chronic kidney disease, stage 3a: Secondary | ICD-10-CM | POA: Insufficient documentation

## 2022-09-03 LAB — GLUCOSE, CAPILLARY: Glucose-Capillary: 219 mg/dL — ABNORMAL HIGH (ref 70–99)

## 2022-09-03 NOTE — Progress Notes (Addendum)
Cardiac Individual Treatment Plan  Patient Details  Name: MARKS SCALERA MRN: [id number] Date of Birth: 1940/12/08 Referring Provider:   Flowsheet Row INTENSIVE CARDIAC REHAB ORIENT from 09/03/2022 in [place]  Referring Provider Dr. Sherren Mocha MD       Initial Encounter Date:  [place] from 09/03/2022 in [place]  Date 09/03/22       Visit Diagnosis: 07/14/22 S/P TAVR Sublclavian approach  Patient's Home Medications on Admission:  Current Outpatient Medications:    amoxicillin (AMOXIL) 500 MG tablet, Take 4 tablets (2,000 mg total) by mouth as directed. 1 hour prior to dental work including cleanings, Disp: 12 tablet, Rfl: 12   aspirin 81 MG chewable tablet, Chew 1 tablet (81 mg total) by mouth daily., Disp: 90 tablet, Rfl: 3   atenolol-chlorthalidone (TENORETIC) 100-25 MG tablet, TAKE ONE TABLET BY MOUTH ONCE DAILY, Disp: 90 tablet, Rfl: 0   clopidogrel (PLAVIX) 75 MG tablet, Take 1 tablet (75 mg total) by mouth daily with breakfast., Disp: 90 tablet, Rfl: 1   Continuous Blood Gluc Receiver (FREESTYLE LIBRE 2 READER) DEVI, USE AS DIRECTED., Disp: 2 each, Rfl: 5   Continuous Blood Gluc Sensor (FREESTYLE LIBRE 2 SENSOR) MISC, USE AS DIRECTED AND REPLACE EVERY 14 DAYS., Disp: 2 each, Rfl: 5   DULoxetine (CYMBALTA) 60 MG capsule, TAKE ONE CAPSULE DAILY, Disp: 90 capsule, Rfl: 1   fluticasone (FLONASE) 50 MCG/ACT nasal spray, Place 1 spray into both nostrils daily as needed for allergies or rhinitis., Disp: , Rfl:    Insulin Glargine-Lixisenatide (SOLIQUA) 100-33 UNT-MCG/ML SOPN, Inject 60 Units into the skin daily., Disp: 54 mL, Rfl: 1   Insulin Pen Needle (NOVOFINE) 32G X 6 MM MISC, 1 Act by Does not apply route daily., Disp: 100 each, Rfl: 1   JARDIANCE 25 MG TABS tablet, TAKE ONE TABLET ONCE DAILY BEFORE BREAKFAST., Disp: 90 tablet, Rfl: 1   KERENDIA 20 MG TABS, Take 1 tablet  by mouth daily., Disp: 90 tablet, Rfl: 0   losartan (COZAAR) 100 MG tablet, Take 1 tablet (100 mg total) by mouth daily. for high blood pressure, Disp: 90 tablet, Rfl: 1   Multiple Vitamin (MULTIVITAMIN WITH MINERALS) TABS tablet, Take 1 tablet by mouth daily., Disp: , Rfl:    Multiple Vitamins-Minerals (PRESERVISION AREDS PO), Take 1 tablet by mouth in the morning and at bedtime., Disp: , Rfl:    MYRBETRIQ 50 MG TB24 tablet, TAKE 1 TABLET ONCE DAILY., Disp: 90 tablet, Rfl: 1   pravastatin (PRAVACHOL) 40 MG tablet, TAKE ONE TABLET BY MOUTH ONCE DAILY, Disp: 90 tablet, Rfl: 0   pregabalin (LYRICA) 150 MG capsule, Take 150 mg by mouth See admin instructions. 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 CBurt Knackand Dr. BCyndia Bent  Severe aortic stenosis     Tobacco Use: Social History   Tobacco Use  Smoking Status Former   Types: Cigarettes   Quit date: 11/30/1974   Years since quitting: 47.7   Passive exposure: Never  Smokeless Tobacco Never    Labs: Review Flowsheet  More data exists      Latest Ref Rng & Units 12/04/2021 03/10/2022 05/07/2022 07/10/2022 07/14/2022  Labs for ITP Cardiac and Pulmonary Rehab  Cholestrol 0 - 200 mg/dL 152  - - - -  LDL (calc) 0 - 99 mg/dL 93  - - - -  HDL-C >39.00 mg/dL 40.20  - - - -  Trlycerides 0.0 - 149.0 mg/dL 96.0  - - - -  Hemoglobin A1c 4.8 - 5.6 % 8.4  8.1  - 7.2  -  PH, Arterial 7.35 - 7.45 -  - 7.311  - 7.372   PCO2 arterial 32 - 48 mmHg - - 51.5  - 46.2   Bicarbonate 20.0 - 28.0 mmol/L - - 26.3  26.0  - 26.8   TCO2 22 - 32 mmol/L - - 28  28  - '26  28  26   '$ Acid-base deficit 0.0 - 2.0 mmol/L 0.0 - 2.0 mmol/L - - 1.0  1.0  - -  O2 Saturation % - - 78  98  - 100     Capillary Blood Glucose: Lab Results  Component Value Date   GLUCAP 219 (H) 09/03/2022   GLUCAP 117 (H) 07/15/2022   GLUCAP 206 (H) 07/15/2022   GLUCAP 238 (H) 07/15/2022   GLUCAP 221 (H) 07/14/2022     Exercise Target Goals: Exercise Program Goal: Individual exercise prescription set using results from initial 6 min walk test and THRR while considering  patient's activity barriers and safety.   Exercise Prescription Goal: Initial exercise prescription builds to 30-45 minutes a day of aerobic activity, 2-3 days per week.  Home exercise guidelines will be given to patient during program as part of exercise prescription that the participant will acknowledge.  Activity Barriers & Risk Stratification:  Activity Barriers & Cardiac Risk Stratification - 09/03/22 1441       Activity Barriers & Cardiac Risk Stratification   Activity Barriers Back Problems;Joint Problems;Balance Concerns;Deconditioning;History of Falls;Muscular Weakness;Assistive Device    Cardiac Risk Stratification High             6 Minute Walk:  6 Minute Walk     Row Name 09/03/22 1436         6 Minute Walk   Phase Initial  Nustep Stepper test: Assistive device, previous falls, instability, balance concerns     Distance 821 feet     Walk Time 6 minutes     # of Rest Breaks 0     MPH 1.55     METS 0.5     RPE 13     Perceived Dyspnea  0     VO2 Peak 1.75     Symptoms No     Resting HR 77 bpm     Resting BP 118/78     Resting Oxygen Saturation  97 %     Exercise Oxygen Saturation  during 6 min walk 95 %     Max Ex. HR 67 bpm     Max Ex. BP 124/76     2 Minute Post BP 128/68              Oxygen Initial  Assessment:   Oxygen Re-Evaluation:   Oxygen  Discharge (Final Oxygen Re-Evaluation):   Initial Exercise Prescription:  Initial Exercise Prescription - 09/03/22 1400       Date of Initial Exercise RX and Referring Provider   Date 09/03/22    Referring Provider Dr. Sherren Mocha MD    Expected Discharge Date 10/30/22      NuStep   Level 1    SPM 55    Minutes 25    METs 1.7      Prescription Details   Frequency (times per week) 3    Duration Progress to 30 minutes of continuous aerobic without signs/symptoms of physical distress      Intensity   THRR 40-80% of Max Heartrate 55-110    Ratings of Perceived Exertion 11-13    Perceived Dyspnea 0-4      Progression   Progression Continue progressive overload as per policy without signs/symptoms or physical distress.      Resistance Training   Training Prescription Yes    Weight 3    Reps 10-15             Perform Capillary Blood Glucose checks as needed.  Exercise Prescription Changes:   Exercise Comments:   Exercise Goals and Review:   Exercise Goals     Row Name 09/03/22 1443             Exercise Goals   Increase Physical Activity Yes       Intervention Provide advice, education, support and counseling about physical activity/exercise needs.;Develop an individualized exercise prescription for aerobic and resistive training based on initial evaluation findings, risk stratification, comorbidities and participant's personal goals.       Expected Outcomes Short Term: Attend rehab on a regular basis to increase amount of physical activity.;Long Term: Add in home exercise to make exercise part of routine and to increase amount of physical activity.;Long Term: Exercising regularly at least 3-5 days a week.       Increase Strength and Stamina Yes       Intervention Provide advice, education, support and counseling about physical activity/exercise needs.;Develop an individualized exercise prescription for  aerobic and resistive training based on initial evaluation findings, risk stratification, comorbidities and participant's personal goals.       Expected Outcomes Short Term: Increase workloads from initial exercise prescription for resistance, speed, and METs.;Short Term: Perform resistance training exercises routinely during rehab and add in resistance training at home;Long Term: Improve cardiorespiratory fitness, muscular endurance and strength as measured by increased METs and functional capacity (6MWT)       Able to understand and use rate of perceived exertion (RPE) scale Yes       Intervention Provide education and explanation on how to use RPE scale       Expected Outcomes Short Term: Able to use RPE daily in rehab to express subjective intensity level;Long Term:  Able to use RPE to guide intensity level when exercising independently       Knowledge and understanding of Target Heart Rate Range (THRR) Yes       Intervention Provide education and explanation of THRR including how the numbers were predicted and where they are located for reference       Expected Outcomes Short Term: Able to state/look up THRR;Long Term: Able to use THRR to govern intensity when exercising independently;Short Term: Able to use daily as guideline for intensity in rehab       Understanding of Exercise Prescription Yes       Intervention Provide education, explanation,  and written materials on patient's individual exercise prescription       Expected Outcomes Short Term: Able to explain program exercise prescription;Long Term: Able to explain home exercise prescription to exercise independently                Exercise Goals Re-Evaluation :   Discharge Exercise Prescription (Final Exercise Prescription Changes):   Nutrition:  Target Goals: Understanding of nutrition guidelines, daily intake of sodium '1500mg'$ , cholesterol '200mg'$ , calories 30% from fat and 7% or less from saturated fats, daily to have 5 or more  servings of fruits and vegetables.  Biometrics:  Pre Biometrics - 09/03/22 1444       Pre Biometrics   Waist Circumference 51 inches    Hip Circumference 45 inches    Waist to Hip Ratio 1.13 %    Triceps Skinfold 12 mm    % Body Fat 35.6 %    Grip Strength 31 kg    Flexibility --   Attempted but unable to reach.   Single Leg Stand --   Did not attempt, asssistive device, previous falls and stability issues             Nutrition Therapy Plan and Nutrition Goals:   Nutrition Assessments:  MEDIFICTS Score Key: ?70 Need to make dietary changes  40-70 Heart Healthy Diet ? 40 Therapeutic Level Cholesterol Diet    Picture Your Plate Scores: <79 Unhealthy dietary pattern with much room for improvement. 41-50 Dietary pattern unlikely to meet recommendations for good health and room for improvement. 51-60 More healthful dietary pattern, with some room for improvement.  >60 Healthy dietary pattern, although there may be some specific behaviors that could be improved.    Nutrition Goals Re-Evaluation:   Nutrition Goals Re-Evaluation:   Nutrition Goals Discharge (Final Nutrition Goals Re-Evaluation):   Psychosocial: Target Goals: Acknowledge presence or absence of significant depression and/or stress, maximize coping skills, provide positive support system. Participant is able to verbalize types and ability to use techniques and skills needed for reducing stress and depression.  Initial Review & Psychosocial Screening:  Initial Psych Review & Screening - 09/03/22 1344       Initial Review   Current issues with Current Stress Concerns    Source of Stress Concerns Chronic Illness;Unable to participate in former interests or hobbies;Unable to perform yard/household activities    Comments [name] denies being currently depressed. Wife says that [name] has been feeling down recently      [place]? Yes   [name] has his wife for support     Barriers    Psychosocial barriers to participate in program The patient should benefit from training in stress management and relaxation.      Screening Interventions   Interventions Encouraged to exercise;To provide support and resources with identified psychosocial needs    Expected Outcomes Long Term Goal: Stressors or current issues are controlled or eliminated.             Quality of Life Scores:  Quality of Life - 09/03/22 1447       Quality of Life   Select Quality of Life      Quality of Life Scores   Health/Function Pre 23.71 %    Socioeconomic Pre 30 %    Psych/Spiritual Pre 28.93 %    Family Pre 30 %    GLOBAL Pre 26.71 %            Scores of 19 and below  usually indicate a poorer quality of life in these areas.  A difference of  2-3 points is a clinically meaningful difference.  A difference of 2-3 points in the total score of the Quality of Life Index has been associated with significant improvement in overall quality of life, self-image, physical symptoms, and general health in studies assessing change in quality of life.  PHQ-9: Review Flowsheet  More data exists      09/03/2022 03/02/2022 04/21/2021 02/28/2020 12/04/2019  Depression screen PHQ 2/9  Decreased Interest 0 0 0 0 0 2  Down, Depressed, Hopeless 0 0 0 0 0 1  PHQ - 2 Score 0 0 0 0 0 3  Altered sleeping - - - - 1  Tired, decreased energy - - - - 1  Change in appetite - - - - 2  Feeling bad or failure about yourself  - - - - 1  Trouble concentrating - - - - 1  Moving slowly or fidgety/restless - - - - 0  Suicidal thoughts - - - - 0  PHQ-9 Score - - - - 9  Difficult doing work/chores - - - - Not difficult at all   Interpretation of Total Score  Total Score Depression Severity:  1-4 = Minimal depression, 5-9 = Mild depression, 10-14 = Moderate depression, 15-19 = Moderately severe depression, 20-27 = Severe depression   Psychosocial Evaluation and Intervention:   Psychosocial  Re-Evaluation:   Psychosocial Discharge (Final Psychosocial Re-Evaluation):   Vocational Rehabilitation: Provide vocational rehab assistance to qualifying candidates.   Vocational Rehab Evaluation & Intervention:  Vocational Rehab - 09/03/22 1346       Initial Vocational Rehab Evaluation & Intervention   Assessment shows need for Vocational Rehabilitation No   [name] is retired and does not need vocational rehab at this time            Education: Education Goals: Education classes will be provided on a weekly basis, covering required topics. Participant will state understanding/return demonstration of topics presented.     Core Videos: Exercise    Move It!  Clinical staff conducted group or individual video education with verbal and written material and guidebook.  Patient learns the recommended Pritikin exercise program. Exercise with the goal of living a long, healthy life. Some of the health benefits of exercise include controlled diabetes, healthier blood pressure levels, improved cholesterol levels, improved heart and lung capacity, improved sleep, and better body composition. Everyone should speak with their doctor before starting or changing an exercise routine.  Biomechanical Limitations Clinical staff conducted group or individual video education with verbal and written material and guidebook.  Patient learns how biomechanical limitations can impact exercise and how we can mitigate and possibly overcome limitations to have an impactful and balanced exercise routine.  Body Composition Clinical staff conducted group or individual video education with verbal and written material and guidebook.  Patient learns that body composition (ratio of muscle mass to fat mass) is a key component to assessing overall fitness, rather than body weight alone. Increased fat mass, especially visceral belly fat, can put [place] at increased risk for metabolic syndrome, type 2 diabetes, heart  disease, and even death. It is recommended to combine diet and exercise (cardiovascular and resistance training) to improve your body composition. Seek guidance from your physician and exercise physiologist before implementing an exercise routine.  Exercise Action Plan Clinical staff conducted group or individual video education with verbal and written material and guidebook.  Patient learns the recommended strategies  to achieve and enjoy long-term exercise adherence, including variety, self-motivation, self-efficacy, and positive decision making. Benefits of exercise include fitness, good health, weight management, more energy, better sleep, less stress, and overall well-being.  Medical   Heart Disease Risk Reduction Clinical staff conducted group or individual video education with verbal and written material and guidebook.  Patient learns our heart is our most vital organ as it circulates oxygen, nutrients, white blood cells, and hormones throughout the entire body, and carries waste away. Data supports a plant-based eating plan like the Pritikin Program for its effectiveness in slowing progression of and reversing heart disease. The video provides a number of recommendations to address heart disease.   Metabolic Syndrome and Belly Fat  Clinical staff conducted group or individual video education with verbal and written material and guidebook.  Patient learns what metabolic syndrome is, how it leads to heart disease, and how one can reverse it and keep it from coming back. You have metabolic syndrome if you have 3 of the following 5 criteria: abdominal obesity, high blood pressure, high triglycerides, low HDL cholesterol, and high blood sugar.  Hypertension and Heart Disease Clinical staff conducted group or individual video education with verbal and written material and guidebook.  Patient learns that high blood pressure, or hypertension, is very common in the [place]. Hypertension is  largely due to excessive salt intake, but other important risk factors include being overweight, physical inactivity, drinking too much alcohol, smoking, and not eating enough potassium from fruits and vegetables. High blood pressure is a leading risk factor for heart attack, stroke, congestive heart failure, dementia, kidney failure, and premature death. Long-term effects of excessive salt intake include stiffening of the arteries and thickening of heart muscle and organ damage. Recommendations include ways to reduce hypertension and the risk of heart disease.  Diseases of Our Time - Focusing on Diabetes Clinical staff conducted group or individual video education with verbal and written material and guidebook.  Patient learns why the best way to stop diseases of our time is prevention, through food and other lifestyle changes. Medicine (such as prescription pills and surgeries) is often only a Band-Aid on the problem, not a long-term solution. Most common diseases of our time include obesity, type 2 diabetes, hypertension, heart disease, and cancer. The Pritikin Program is recommended and has been proven to help reduce, reverse, and/or prevent the damaging effects of metabolic syndrome.  Nutrition   Overview of the Pritikin Eating Plan  Clinical staff conducted group or individual video education with verbal and written material and guidebook.  Patient learns about the [place] for disease risk reduction. The [place] emphasizes a wide variety of unrefined, minimally-processed carbohydrates, like fruits, vegetables, whole grains, and legumes. Go, Caution, and Stop food choices are explained. Plant-based and lean animal proteins are emphasized. Rationale provided for low sodium intake for blood pressure control, low added sugars for blood sugar stabilization, and low added fats and oils for coronary artery disease risk reduction and weight management.  Calorie Density  Clinical  staff conducted group or individual video education with verbal and written material and guidebook.  Patient learns about calorie density and how it impacts the Pritikin Eating Plan. Knowing the characteristics of the food you choose will help you decide whether those foods will lead to weight gain or weight loss, and whether you want to consume more or less of them. Weight loss is usually a side effect of the Pritikin Eating Plan because of its  focus on low calorie-dense foods.  Label Reading  Clinical staff conducted group or individual video education with verbal and written material and guidebook.  Patient learns about the Pritikin recommended label reading guidelines and corresponding recommendations regarding calorie density, added sugars, sodium content, and whole grains.  Dining Out - Part 1  Clinical staff conducted group or individual video education with verbal and written material and guidebook.  Patient learns that restaurant meals can be sabotaging because they can be so high in calories, fat, sodium, and/or sugar. Patient learns recommended strategies on how to positively address this and avoid unhealthy pitfalls.  Facts on Fats  Clinical staff conducted group or individual video education with verbal and written material and guidebook.  Patient learns that lifestyle modifications can be just as effective, if not more so, as many medications for lowering your risk of heart disease. A Pritikin lifestyle can help to reduce your risk of inflammation and atherosclerosis (cholesterol build-up, or plaque, in the artery walls). Lifestyle interventions such as dietary choices and physical activity address the cause of atherosclerosis. A review of the types of fats and their impact on blood cholesterol levels, along with dietary recommendations to reduce fat intake is also included.  Nutrition Action Plan  Clinical staff conducted group or individual video education with verbal and written  material and guidebook.  Patient learns how to incorporate Pritikin recommendations into their lifestyle. Recommendations include planning and keeping personal health goals in mind as an important part of their success.  Healthy Mind-Set    Healthy Minds, Bodies, Hearts  Clinical staff conducted group or individual video education with verbal and written material and guidebook.  Patient learns how to identify when they are stressed. Video will discuss the impact of that stress, as well as the many benefits of stress management. Patient will also be introduced to stress management techniques. The way we think, act, and feel has an impact on our hearts.  How Our Thoughts Can Heal Our Hearts  Clinical staff conducted group or individual video education with verbal and written material and guidebook.  Patient learns that negative thoughts can cause depression and anxiety. This can result in negative lifestyle behavior and serious health problems. Cognitive behavioral therapy is an effective method to help control our thoughts in order to change and improve our emotional outlook.  Additional Videos:  Exercise    Improving Performance  Clinical staff conducted group or individual video education with verbal and written material and guidebook.  Patient learns to use a non-linear approach by alternating intensity levels and lengths of time spent exercising to help burn more calories and lose more body fat. Cardiovascular exercise helps improve heart health, metabolism, hormonal balance, blood sugar control, and recovery from fatigue. Resistance training improves strength, endurance, balance, coordination, reaction time, metabolism, and muscle mass. Flexibility exercise improves circulation, posture, and balance. Seek guidance from your physician and exercise physiologist before implementing an exercise routine and learn your capabilities and proper form for all exercise.  Introduction to Yoga  Clinical  staff conducted group or individual video education with verbal and written material and guidebook.  Patient learns about yoga, a discipline of the coming together of mind, breath, and body. The benefits of yoga include improved flexibility, improved range of motion, better posture and core strength, increased lung function, weight loss, and positive self-image. Yoga's heart health benefits include lowered blood pressure, healthier heart rate, decreased cholesterol and triglyceride levels, improved immune function, and reduced stress. Seek guidance from your  physician and exercise physiologist before implementing an exercise routine and learn your capabilities and proper form for all exercise.  Medical   Aging: Enhancing Your Quality of Life  Clinical staff conducted group or individual video education with verbal and written material and guidebook.  Patient learns key strategies and recommendations to stay in good physical health and enhance quality of life, such as prevention strategies, having an advocate, securing a [place], and keeping a list of medications and system for tracking them. It also discusses how to avoid risk for bone loss.  Biology of Weight Control  Clinical staff conducted group or individual video education with verbal and written material and guidebook.  Patient learns that weight gain occurs because we consume more calories than we burn (eating more, moving less). Even if your body weight is normal, you may have higher ratios of fat compared to muscle mass. Too much body fat puts you at increased risk for cardiovascular disease, heart attack, stroke, type 2 diabetes, and obesity-related cancers. In addition to exercise, following the [place] can help reduce your risk.  Decoding Lab Results  Clinical staff conducted group or individual video education with verbal and written material and guidebook.  Patient learns that lab test  reflects one measurement whose values change over time and are influenced by many factors, including medication, stress, sleep, exercise, food, hydration, pre-existing medical conditions, and more. It is recommended to use the knowledge from this video to become more involved with your lab results and evaluate your numbers to speak with your doctor.   Diseases of Our Time - Overview  Clinical staff conducted group or individual video education with verbal and written material and guidebook.  Patient learns that according to the CDC, 50% to 70% of chronic diseases (such as obesity, type 2 diabetes, elevated lipids, hypertension, and heart disease) are avoidable through lifestyle improvements including healthier food choices, listening to satiety cues, and increased physical activity.  Sleep Disorders Clinical staff conducted group or individual video education with verbal and written material and guidebook.  Patient learns how good quality and duration of sleep are important to overall health and well-being. Patient also learns about sleep disorders and how they impact health along with recommendations to address them, including discussing with a physician.  Nutrition  Dining Out - Part 2 Clinical staff conducted group or individual video education with verbal and written material and guidebook.  Patient learns how to plan ahead and communicate in order to maximize their dining experience in a healthy and nutritious manner. Included are recommended food choices based on the type of restaurant the patient is visiting.   Fueling a Best boy conducted group or individual video education with verbal and written material and guidebook.  There is a strong connection between our food choices and our health. Diseases like obesity and type 2 diabetes are very prevalent and are in large-part due to lifestyle choices. The Pritikin Eating Plan provides plenty of food and hunger-curbing  satisfaction. It is easy to follow, affordable, and helps reduce health risks.  Menu Workshop  Clinical staff conducted group or individual video education with verbal and written material and guidebook.  Patient learns that restaurant meals can sabotage health goals because they are often packed with calories, fat, sodium, and sugar. Recommendations include strategies to plan ahead and to communicate with the manager, chef, or server to help order a healthier meal.  Planning Your Eating Strategy  Clinical staff conducted group or individual video education with verbal and written material and guidebook.  Patient learns about the [place] and its benefit of reducing the risk of disease. The [place] does not focus on calories. Instead, it emphasizes high-quality, nutrient-rich foods. By knowing the characteristics of the foods, we choose, we can determine their calorie density and make informed decisions.  Targeting Your Nutrition Priorities  Clinical staff conducted group or individual video education with verbal and written material and guidebook.  Patient learns that lifestyle habits have a tremendous impact on disease risk and progression. This video provides eating and physical activity recommendations based on your personal health goals, such as reducing LDL cholesterol, losing weight, preventing or controlling type 2 diabetes, and reducing high blood pressure.  Vitamins and Minerals  Clinical staff conducted group or individual video education with verbal and written material and guidebook.  Patient learns different ways to obtain key vitamins and minerals, including through a recommended healthy diet. It is important to discuss all supplements you take with your doctor.   Healthy Mind-Set    Smoking Cessation  Clinical staff conducted group or individual video education with verbal and written material and guidebook.  Patient learns that cigarette smoking and  tobacco addiction pose a serious health risk which affects millions of people. Stopping smoking will significantly reduce the risk of heart disease, lung disease, and many forms of cancer. Recommended strategies for quitting are covered, including working with your doctor to develop a successful plan.  Culinary   Becoming a Financial trader conducted group or individual video education with verbal and written material and guidebook.  Patient learns that cooking at home can be healthy, cost-effective, quick, and puts them in control. Keys to cooking healthy recipes will include looking at your recipe, assessing your equipment needs, planning ahead, making it simple, choosing cost-effective seasonal ingredients, and limiting the use of added fats, salts, and sugars.  Cooking - Breakfast and Snacks  Clinical staff conducted group or individual video education with verbal and written material and guidebook.  Patient learns how important breakfast is to satiety and nutrition through the entire day. Recommendations include key foods to eat during breakfast to help stabilize blood sugar levels and to prevent overeating at meals later in the day. Planning ahead is also a key component.  Cooking - Human resources officer conducted group or individual video education with verbal and written material and guidebook.  Patient learns eating strategies to improve overall health, including an approach to cook more at home. Recommendations include thinking of animal protein as a side on your plate rather than center stage and focusing instead on lower calorie dense options like vegetables, fruits, whole grains, and plant-based proteins, such as beans. Making sauces in large quantities to freeze for later and leaving the skin on your vegetables are also recommended to maximize your experience.  Cooking - Healthy Salads and Dressing Clinical staff conducted group or individual video education with  verbal and written material and guidebook.  Patient learns that vegetables, fruits, whole grains, and legumes are the foundations of the [place]. Recommendations include how to incorporate each of these in flavorful and healthy salads, and how to create homemade salad dressings. Proper handling of ingredients is also covered. Cooking - Soups and [employer] - Soups and Desserts Clinical staff conducted group or individual video education with verbal and written material and guidebook.  Patient learns that Pritikin  soups and desserts make for easy, nutritious, and delicious snacks and meal components that are low in sodium, fat, sugar, and calorie density, while high in vitamins, minerals, and filling fiber. Recommendations include simple and healthy ideas for soups and desserts.   Overview     The Pritikin Solution Program Overview Clinical staff conducted group or individual video education with verbal and written material and guidebook.  Patient learns that the results of the [place] Program have been documented in more than 100 articles published in peer-reviewed journals, and the benefits include reducing risk factors for (and, in some cases, even reversing) high cholesterol, high blood pressure, type 2 diabetes, obesity, and more! An overview of the three key pillars of the Pritikin Program will be covered: eating well, doing regular exercise, and having a healthy mind-set.  WORKSHOPS  Exercise: Exercise Basics: Building Your Action Plan Clinical staff led group instruction and group discussion with PowerPoint presentation and patient guidebook. To enhance the learning environment the use of posters, models and videos may be added. At the conclusion of this workshop, patients will comprehend the difference between physical activity and exercise, as well as the benefits of incorporating both, into their routine. Patients will understand the FITT (Frequency, Intensity, Time,  and Type) principle and how to use it to build an exercise action plan. In addition, safety concerns and other considerations for exercise and cardiac rehab will be addressed by the presenter. The purpose of this lesson is to promote a comprehensive and effective weekly exercise routine in order to improve patients' overall level of fitness.   Managing Heart Disease: Your Path to a Healthier Heart Clinical staff led group instruction and group discussion with PowerPoint presentation and patient guidebook. To enhance the learning environment the use of posters, models and videos may be added.At the conclusion of this workshop, patients will understand the anatomy and physiology of the heart. Additionally, they will understand how Pritikin's three pillars impact the risk factors, the progression, and the management of heart disease.  The purpose of this lesson is to provide a high-level overview of the heart, heart disease, and how the Pritikin lifestyle positively impacts risk factors.  Exercise Biomechanics Clinical staff led group instruction and group discussion with PowerPoint presentation and patient guidebook. To enhance the learning environment the use of posters, models and videos may be added. Patients will learn how the structural parts of their bodies function and how these functions impact their daily activities, movement, and exercise. Patients will learn how to promote a neutral spine, learn how to manage pain, and identify ways to improve their physical movement in order to promote healthy living. The purpose of this lesson is to expose patients to common physical limitations that impact physical activity. Participants will learn practical ways to adapt and manage aches and pains, and to minimize their effect on regular exercise. Patients will learn how to maintain good posture while sitting, walking, and lifting.  Balance Training and Fall Prevention  Clinical staff led group  instruction and group discussion with PowerPoint presentation and patient guidebook. To enhance the learning environment the use of posters, models and videos may be added. At the conclusion of this workshop, patients will understand the importance of their sensorimotor skills (vision, proprioception, and the vestibular system) in maintaining their ability to balance as they age. Patients will apply a variety of balancing exercises that are appropriate for their current level of function. Patients will understand the common causes for poor balance, possible solutions to these  problems, and ways to modify their physical environment in order to minimize their fall risk. The purpose of this lesson is to teach patients about the importance of maintaining balance as they age and ways to minimize their risk of falling.  WORKSHOPS   Nutrition:  Fueling a Scientist, research (physical sciences) led group instruction and group discussion with PowerPoint presentation and patient guidebook. To enhance the learning environment the use of posters, models and videos may be added. Patients will review the foundational principles of the [place] and understand what constitutes a serving size in each of the food groups. Patients will also learn Pritikin-friendly foods that are better choices when away from home and review make-ahead meal and snack options. Calorie density will be reviewed and applied to three nutrition priorities: weight maintenance, weight loss, and weight gain. The purpose of this lesson is to reinforce (in a group setting) the key concepts around what patients are recommended to eat and how to apply these guidelines when away from home by planning and selecting Pritikin-friendly options. Patients will understand how calorie density may be adjusted for different weight management goals.  Mindful Eating  Clinical staff led group instruction and group discussion with PowerPoint presentation and patient  guidebook. To enhance the learning environment the use of posters, models and videos may be added. Patients will briefly review the concepts of the [place] and the importance of low-calorie dense foods. The concept of mindful eating will be introduced as well as the importance of paying attention to internal hunger signals. Triggers for non-hunger eating and techniques for dealing with triggers will be explored. The purpose of this lesson is to provide patients with the opportunity to review the basic principles of the [place], discuss the value of eating mindfully and how to measure internal cues of hunger and fullness using the Hunger Scale. Patients will also discuss reasons for non-hunger eating and learn strategies to use for controlling emotional eating.  Targeting Your Nutrition Priorities Clinical staff led group instruction and group discussion with PowerPoint presentation and patient guidebook. To enhance the learning environment the use of posters, models and videos may be added. Patients will learn how to determine their genetic susceptibility to disease by reviewing their family history. Patients will gain insight into the importance of diet as part of an overall healthy lifestyle in mitigating the impact of genetics and other environmental insults. The purpose of this lesson is to provide patients with the opportunity to assess their personal nutrition priorities by looking at their family history, their own health history and current risk factors. Patients will also be able to discuss ways of prioritizing and modifying the [place] for their highest risk areas  Menu  Clinical staff led group instruction and group discussion with PowerPoint presentation and patient guidebook. To enhance the learning environment the use of posters, models and videos may be added. Using menus brought in from [employer], or printed from [employer], patients will apply  the [place] dining out guidelines that were presented in the [employer] video. Patients will also be able to practice these guidelines in a variety of provided scenarios. The purpose of this lesson is to provide patients with the opportunity to practice hands-on learning of the [place] with actual menus and practice scenarios.  Label Reading Clinical staff led group instruction and group discussion with PowerPoint presentation and patient guidebook. To enhance the learning environment the use of posters,  models and videos may be added. Patients will review and discuss the Pritikin label reading guidelines presented in Pritikin's Label Reading Educational series video. Using fool labels brought in from local grocery stores and markets, patients will apply the label reading guidelines and determine if the packaged food meet the Pritikin guidelines. The purpose of this lesson is to provide patients with the opportunity to review, discuss, and practice hands-on learning of the Pritikin Label Reading guidelines with actual packaged food labels. [place] [facility] Workshops are designed to teach patients ways to prepare quick, simple, and affordable recipes at home. The importance of nutrition's role in chronic disease risk reduction is reflected in its emphasis in the overall Pritikin program. By learning how to prepare essential core Pritikin Eating Plan recipes, patients will increase control over what they eat; be able to customize the flavor of foods without the use of added salt, sugar, or fat; and improve the quality of the food they consume. By learning a set of core recipes which are easily assembled, quickly prepared, and affordable, patients are more likely to prepare more healthy foods at home. These workshops focus on convenient breakfasts, simple entres, side dishes, and desserts which can be prepared with minimal effort and are  consistent with nutrition recommendations for cardiovascular risk reduction. Cooking [employer] are taught by a Engineer, materials (RD) who has been trained by the [employer]. The chef or RD has a clear understanding of the importance of minimizing - if not completely eliminating - added fat, sugar, and sodium in recipes. Throughout the series of [place] Workshop sessions, patients will learn about healthy ingredients and efficient methods of cooking to build confidence in their capability to prepare    Cooking School weekly topics:  Adding Flavor- Sodium-Free  Fast and Healthy Breakfasts  Powerhouse Plant-Based Proteins  Satisfying Salads and Dressings  Simple Sides and Sauces  International Cuisine-Spotlight on the [place] Zones  Delicious Desserts  Savory Soups  Efficiency Cooking - Meals in a Snap  Tasty Appetizers and Snacks  Comforting Weekend Breakfasts  One-Pot Wonders   Fast Evening Meals  Easy [place] (Psychosocial): New Thoughts, New Behaviors Clinical staff led group instruction and group discussion with PowerPoint presentation and patient guidebook. To enhance the learning environment the use of posters, models and videos may be added. Patients will learn and practice techniques for developing effective health and lifestyle goals. Patients will be able to effectively apply the goal setting process learned to develop at least one new personal goal.  The purpose of this lesson is to expose patients to a new skill set of behavior modification techniques such as techniques setting SMART goals, overcoming barriers, and achieving new thoughts and new behaviors.  Managing Moods and Relationships Clinical staff led group instruction and group discussion with PowerPoint presentation and patient guidebook. To enhance the learning environment the use of posters, models and videos may  be added. Patients will learn how emotional and chronic stress factors can impact their health and relationships. They will learn healthy ways to manage their moods and utilize positive coping mechanisms. In addition, ICR patients will learn ways to improve communication skills. The purpose of this lesson is to expose patients to ways of understanding how one's mood and health are intimately connected. Developing a healthy outlook can help build positive relationships and connections with others. Patients will understand the importance of utilizing effective  communication skills that include actively listening and being heard. They will learn and understand the importance of the "4 Cs" and especially Connections in fostering of a Healthy Mind-Set.  Healthy Sleep for a Healthy Heart Clinical staff led group instruction and group discussion with PowerPoint presentation and patient guidebook. To enhance the learning environment the use of posters, models and videos may be added. At the conclusion of this workshop, patients will be able to demonstrate knowledge of the importance of sleep to overall health, well-being, and quality of life. They will understand the symptoms of, and treatments for, common sleep disorders. Patients will also be able to identify daytime and nighttime behaviors which impact sleep, and they will be able to apply these tools to help manage sleep-related challenges. The purpose of this lesson is to provide patients with a general overview of sleep and outline the importance of quality sleep. Patients will learn about a few of the most common sleep disorders. Patients will also be introduced to the concept of "sleep hygiene," and discover ways to self-manage certain sleeping problems through simple daily behavior changes. Finally, the workshop will motivate patients by clarifying the links between quality sleep and their goals of heart-healthy living.   Recognizing and Reducing  Stress Clinical staff led group instruction and group discussion with PowerPoint presentation and patient guidebook. To enhance the learning environment the use of posters, models and videos may be added. At the conclusion of this workshop, patients will be able to understand the types of stress reactions, differentiate between acute and chronic stress, and recognize the impact that chronic stress has on their health. They will also be able to apply different coping mechanisms, such as reframing negative self-talk. Patients will have the opportunity to practice a variety of stress management techniques, such as deep abdominal breathing, progressive muscle relaxation, and/or guided imagery.  The purpose of this lesson is to educate patients on the role of stress in their lives and to provide healthy techniques for coping with it.  Learning Barriers/Preferences:  Learning Barriers/Preferences - 09/03/22 1451       Learning Barriers/Preferences   Learning Barriers Sight   pt wears glasses   Learning Preferences Audio;Computer/Internet;Group Instruction;Individual Instruction;Pictoral;Skilled Demonstration;Verbal Instruction;Video;Written Material             Education Topics:  Knowledge Questionnaire Score:  Knowledge Questionnaire Score - 09/03/22 1452       Knowledge Questionnaire Score   Pre Score 16/24             Core Components/Risk Factors/Patient Goals at Admission:  Personal Goals and Risk Factors at Admission - 09/03/22 1454       Core Components/Risk Factors/Patient Goals on Admission    Weight Management Yes;Obesity;Weight Loss    Intervention Weight Management: Develop a combined nutrition and exercise program designed to reach desired caloric intake, while maintaining appropriate intake of nutrient and fiber, sodium and fats, and appropriate energy expenditure required for the weight goal.;Weight Management: Provide education and appropriate resources to help  participant work on and attain dietary goals.;Weight Management/Obesity: Establish reasonable short term and long term weight goals.;Obesity: Provide education and appropriate resources to help participant work on and attain dietary goals.    Admit Weight 236 lb 5.3 oz (107.2 kg)    Expected Outcomes Short Term: Continue to assess and modify interventions until short term weight is achieved;Long Term: Adherence to nutrition and physical activity/exercise program aimed toward attainment of established weight goal;Weight Loss: Understanding of general recommendations for a balanced  deficit meal plan, which promotes 1-2 lb weight loss per week and includes a negative energy balance of [phone] kcal/d;Understanding recommendations for meals to include 15-35% energy as protein, 25-35% energy from fat, 35-60% energy from carbohydrates, less than '200mg'$  of dietary cholesterol, 20-35 gm of total fiber daily;Understanding of distribution of calorie intake throughout the day with the consumption of 4-5 meals/snacks    Diabetes Yes    Intervention Provide education about signs/symptoms and action to take for hypo/hyperglycemia.;Provide education about proper nutrition, including hydration, and aerobic/resistive exercise prescription along with prescribed medications to achieve blood glucose in normal ranges: Fasting glucose 65-99 mg/dL    Expected Outcomes Short Term: Participant verbalizes understanding of the signs/symptoms and immediate care of hyper/hypoglycemia, proper foot care and importance of medication, aerobic/resistive exercise and nutrition plan for blood glucose control.;Long Term: Attainment of HbA1C < 7%.    Hypertension Yes    Intervention Provide education on lifestyle modifcations including regular physical activity/exercise, weight management, moderate sodium restriction and increased consumption of fresh fruit, vegetables, and low fat dairy, alcohol moderation, and smoking cessation.;Monitor  prescription use compliance.    Expected Outcomes Short Term: Continued assessment and intervention until BP is < 140/33m HG in hypertensive participants. < 130/879mHG in hypertensive participants with diabetes, heart failure or chronic kidney disease.;Long Term: Maintenance of blood pressure at goal levels.    Lipids Yes    Intervention Provide education and support for participant on nutrition & aerobic/resistive exercise along with prescribed medications to achieve LDL '70mg'$ , HDL >'40mg'$ .    Expected Outcomes Short Term: Participant states understanding of desired cholesterol values and is compliant with medications prescribed. Participant is following exercise prescription and nutrition guidelines.;Long Term: Cholesterol controlled with medications as prescribed, with individualized exercise RX and with personalized nutrition plan. Value goals: LDL < '70mg'$ , HDL > 40 mg.    Stress Yes    Intervention Offer individual and/or small group education and counseling on adjustment to heart disease, stress management and health-related lifestyle change. Teach and support self-help strategies.;Refer participants experiencing significant psychosocial distress to appropriate mental health specialists for further evaluation and treatment. When possible, include family members and significant others in education/counseling sessions.    Expected Outcomes Short Term: Participant demonstrates changes in health-related behavior, relaxation and other stress management skills, ability to obtain effective social support, and compliance with psychotropic medications if prescribed.;Long Term: Emotional wellbeing is indicated by absence of clinically significant psychosocial distress or social isolation.    Personal Goal Other Yes    Personal Goal Short: increase amount of walking, flexibility Long term: Strength and stability    Intervention Will continue to monitor pt and progress workloads as tolerated without sign or  symptom    Expected Outcomes Pt will achieve her goals             Core Components/Risk Factors/Patient Goals Review:    Core Components/Risk Factors/Patient Goals at Discharge (Final Review):    ITP Comments:  ITP Comments     Row Name 09/03/22 1336           ITP Comments Dr TrFransico HimD, Medical Director, Introduction to [facility]/Intensive Cardiac rehab. Initial Orientation Packet Reviewed with the patient                Comments:Participant attended orientation for the cardiac rehabilitation program on  09/03/2022  to perform initial intake and exercise walk test. Patient introduced to the [employer] and orientation packet was reviewed. Completed 6-minute walk test,  measurements, initial ITP, and exercise prescription. Vital signs stable. Telemetry-normal sinus rhythm with a first degree heart block, PAC's occasional multifocal PVC asymptomatic. [name] is deconditioned and uses a cane for stability. A 6 minute nustep was completed instead of a walk Seaman RN BSN   Service time was from 1255 to 1450.

## 2022-09-03 NOTE — Progress Notes (Signed)
Cardiac Rehab Medication Review by a Nurse  Does the patient  feel that his/her medications are working for him/her?  yes  Has the patient been experiencing any side effects to the medications prescribed?  no  Does the patient measure his/her own blood pressure or blood glucose at home?  no   Does the patient have any problems obtaining medications due to transportation or finances?   no  Understanding of regimen: good Understanding of indications: good Potential of compliance: good    Nurse comments: Ryan Duke is taking his medications as prescribed and has a good understanding of what his medications are for. Ryan Duke has a blood pressure cuff but does not check his blood pressures at home. Ryan Duke says his CGM is not working correctly. Ryan Duke has ordered a replacement.     Christa See Jamielee Mchale RN 09/03/2022 1:47 PM

## 2022-09-04 ENCOUNTER — Ambulatory Visit (HOSPITAL_COMMUNITY): Payer: Medicare Other

## 2022-09-07 ENCOUNTER — Ambulatory Visit (HOSPITAL_COMMUNITY): Payer: Medicare Other

## 2022-09-07 ENCOUNTER — Encounter (HOSPITAL_COMMUNITY)
Admission: RE | Admit: 2022-09-07 | Discharge: 2022-09-07 | Disposition: A | Discharge status: Home / Self Care | Payer: Medicare Other | Source: Ambulatory Visit | Attending: Cardiovascular Disease | Admitting: Cardiovascular Disease

## 2022-09-07 ENCOUNTER — Telehealth: Payer: Self-pay | Admitting: Internal Medicine

## 2022-09-07 DIAGNOSIS — Z952 Presence of prosthetic heart valve: Secondary | ICD-10-CM

## 2022-09-07 DIAGNOSIS — M5440 Lumbago with sciatica, unspecified side: Secondary | ICD-10-CM

## 2022-09-07 DIAGNOSIS — M1000 Idiopathic gout, unspecified site: Secondary | ICD-10-CM

## 2022-09-07 DIAGNOSIS — E785 Hyperlipidemia, unspecified: Secondary | ICD-10-CM | POA: Diagnosis not present

## 2022-09-07 DIAGNOSIS — I359 Nonrheumatic aortic valve disorder, unspecified: Secondary | ICD-10-CM

## 2022-09-07 DIAGNOSIS — Z48812 Encounter for surgical aftercare following surgery on the circulatory system: Secondary | ICD-10-CM | POA: Diagnosis not present

## 2022-09-07 DIAGNOSIS — N1831 Chronic kidney disease, stage 3a: Secondary | ICD-10-CM | POA: Diagnosis not present

## 2022-09-07 DIAGNOSIS — E1122 Type 2 diabetes mellitus with diabetic chronic kidney disease: Secondary | ICD-10-CM | POA: Diagnosis not present

## 2022-09-07 DIAGNOSIS — Z794 Long term (current) use of insulin: Secondary | ICD-10-CM | POA: Diagnosis not present

## 2022-09-07 LAB — GLUCOSE, CAPILLARY
Glucose-Capillary: 133 mg/dL — ABNORMAL HIGH (ref 70–99)
Glucose-Capillary: 140 mg/dL — ABNORMAL HIGH (ref 70–99)

## 2022-09-07 NOTE — Telephone Encounter (Signed)
Minus Liberty  [place], [place], [place]; Verne Carrow; [facility] Liberty got it, thanks!        Previous Messages    ----- Message -----  From: Jari Pigg, CMA  Sent: 09/07/2022   4:14 PM EDT  To: Darlina Guys; Nash Shearer; [facility] Liberty   Afternoon-   I have entered a DME rollator for this pt per cardiac rehab request. Please reach out to him to discuss. Thanks!   Idris Edmundson, CMA

## 2022-09-07 NOTE — Progress Notes (Signed)
Daily Session Note  Patient Details  Name: Ryan Duke MRN: [id number] Date of Birth: [birth year] Referring Provider:   Flowsheet Row INTENSIVE CARDIAC REHAB ORIENT from 09/03/2022 in [place]  Referring Provider Dr. Sherren Mocha MD       Encounter Date: 09/07/2022  Check In:  Session Check In - 09/07/22 1024       Check-In   Supervising physician immediately available to respond to emergencies [facility] - Physician supervision    Physician(s) Dr. Harrington Challenger    Location [facility]    Staff Present Barnet Pall, RN, BSN;Jetta Walker BS, ACSM-CEP, Exercise Physiologist;Johnny Starleen Blue, MS, Exercise Physiologist;Bailey Pearline Cables, MS, Exercise Physiologist;David Lilyan Punt, MS, ACSM-CEP, CCRP, Exercise Physiologist;Olinty Celesta Aver, MS, ACSM-CEP, Exercise Physiologist    Virtual Visit No    Medication changes reported     No    Fall or balance concerns reported    No    Tobacco Cessation No Change    Current number of cigarettes/nicotine per day     0    Warm-up and Cool-down Performed as group-led instruction   Cardiac Rehab Orientation   Resistance Training Performed Yes    VAD Patient? No    PAD/SET Patient? No      Pain Assessment   Currently in Pain? No/denies    Pain Score 0-No pain    Multiple Pain Sites No             Capillary Blood Glucose: Results for orders placed or performed during the hospital encounter of 09/07/22 (from the past 24 hour(s))  Glucose, capillary     Status: Abnormal   Collection Time: 09/07/22 10:36 AM  Result Value Ref Range   Glucose-Capillary 133 (H) 70 - 99 mg/dL  Glucose, capillary     Status: Abnormal   Collection Time: 09/07/22 11:12 AM  Result Value Ref Range   Glucose-Capillary 140 (H) 70 - 99 mg/dL     Exercise Prescription Changes - 09/07/22 1255       Response to Exercise   Blood Pressure (Admit) 142/70    Blood Pressure (Exercise) 140/62    Blood Pressure (Exit) 122/50    Heart  Rate (Admit) 61 bpm    Heart Rate (Exercise) 81 bpm    Heart Rate (Exit) 61 bpm    Rating of Perceived Exertion (Exercise) 9    Perceived Dyspnea (Exercise) 0    Symptoms 0    Comments Pt first day in teh CRP2 program    Duration Progress to 30 minutes of  aerobic without signs/symptoms of physical distress    Intensity THRR unchanged      Progression   Progression Continue to progress workloads to maintain intensity without signs/symptoms of physical distress.    Average METs 1.3      Resistance Training   Training Prescription Yes    Weight 3    Reps 10-15    Time 10 Minutes      NuStep   Level 1    Minutes 25    METs 1.3             Social History   Tobacco Use  Smoking Status Former   Types: Cigarettes   Quit date: 11/30/1974   Years since quitting: 47.8   Passive exposure: Never  Smokeless Tobacco Never    Goals Met:  Exercise tolerated well No report of concerns or symptoms today Strength training completed today  Goals Unmet:  Not Applicable  Comments: Pt  started cardiac rehab today.  Pt tolerated light exercise without difficulty. Ryan Duke is very deconditioned and was given a rollator to use instead of his cane. Will call Patient's primary care provider Dr Ronnald Ramp office to see if a rollator can be obtained for home use.Ryan Duke would benefit from neuro rehab/ Boost program upon completion of intensive cardiac rehab. Patient's fingers were very cold and discolored. Patient went to bathroom to warm up fingers with warm water to obtain CBG.  Patient and his wife that his fingers are cold and have been this way for a while especially in cold weather will notify Dr Ronnald Ramp office about this also as Ryan Duke has a follow up appointment with Dr Ronnald Ramp on 09/15/22. VSS, telemetry-Sinus rhythm first degree heart block PAC's, asymptomatic.  Medication list reconciled. Pt denies barriers to medicaiton compliance.  PSYCHOSOCIAL ASSESSMENT:  PHQ-0. Pt exhibits positive coping skills,  hopeful outlook with supportive family. Wife says Ryan Herberg has been down but denied being depressed  Ryan Duke passed away at the age of 59 from cancer. Even though this has been several years ago Ryan wife thinks Ryan Duke is still grieving from his loss. Patient is taking an antidepressant.  Pt enjoys Ryan Duke enjoys spending time with his dog and reading.   Pt oriented to exercise equipment and routine.    Understanding verbalized. Harrell Gave RN BSN    Dr. Fransico Him is Medical Director for Cardiac Rehab at [facility].

## 2022-09-07 NOTE — Progress Notes (Signed)
Cardiac Individual Treatment Plan  Patient Details  Name: Ryan Duke MRN: [id number] Date of Birth: 1940/12/08 Referring Provider:   Flowsheet Row INTENSIVE CARDIAC REHAB ORIENT from 09/03/2022 in [place]  Referring Provider Dr. Sherren Mocha MD       Initial Encounter Date:  [place] from 09/03/2022 in [place]  Date 09/03/22       Visit Diagnosis: 07/14/22 S/P TAVR Sublclavian approach  Patient's Home Medications on Admission:  Current Outpatient Medications:    amoxicillin (AMOXIL) 500 MG tablet, Take 4 tablets (2,000 mg total) by mouth as directed. 1 hour prior to dental work including cleanings, Disp: 12 tablet, Rfl: 12   aspirin 81 MG chewable tablet, Chew 1 tablet (81 mg total) by mouth daily., Disp: 90 tablet, Rfl: 3   atenolol-chlorthalidone (TENORETIC) 100-25 MG tablet, TAKE ONE TABLET BY MOUTH ONCE DAILY, Disp: 90 tablet, Rfl: 0   clopidogrel (PLAVIX) 75 MG tablet, Take 1 tablet (75 mg total) by mouth daily with breakfast., Disp: 90 tablet, Rfl: 1   Continuous Blood Gluc Receiver (FREESTYLE LIBRE 2 READER) DEVI, USE AS DIRECTED., Disp: 2 each, Rfl: 5   Continuous Blood Gluc Sensor (FREESTYLE LIBRE 2 SENSOR) MISC, USE AS DIRECTED AND REPLACE EVERY 14 DAYS., Disp: 2 each, Rfl: 5   DULoxetine (CYMBALTA) 60 MG capsule, TAKE ONE CAPSULE DAILY, Disp: 90 capsule, Rfl: 1   fluticasone (FLONASE) 50 MCG/ACT nasal spray, Place 1 spray into both nostrils daily as needed for allergies or rhinitis., Disp: , Rfl:    Insulin Glargine-Lixisenatide (SOLIQUA) 100-33 UNT-MCG/ML SOPN, Inject 60 Units into the skin daily., Disp: 54 mL, Rfl: 1   Insulin Pen Needle (NOVOFINE) 32G X 6 MM MISC, 1 Act by Does not apply route daily., Disp: 100 each, Rfl: 1   JARDIANCE 25 MG TABS tablet, TAKE ONE TABLET ONCE DAILY BEFORE BREAKFAST., Disp: 90 tablet, Rfl: 1   KERENDIA 20 MG TABS, Take 1 tablet  by mouth daily., Disp: 90 tablet, Rfl: 0   losartan (COZAAR) 100 MG tablet, Take 1 tablet (100 mg total) by mouth daily. for high blood pressure, Disp: 90 tablet, Rfl: 1   Multiple Vitamin (MULTIVITAMIN WITH MINERALS) TABS tablet, Take 1 tablet by mouth daily., Disp: , Rfl:    Multiple Vitamins-Minerals (PRESERVISION AREDS PO), Take 1 tablet by mouth in the morning and at bedtime., Disp: , Rfl:    MYRBETRIQ 50 MG TB24 tablet, TAKE 1 TABLET ONCE DAILY., Disp: 90 tablet, Rfl: 1   pravastatin (PRAVACHOL) 40 MG tablet, TAKE ONE TABLET BY MOUTH ONCE DAILY, Disp: 90 tablet, Rfl: 0   pregabalin (LYRICA) 150 MG capsule, Take 150 mg by mouth See admin instructions. 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 CBurt Knackand Dr. BCyndia Bent  Severe aortic stenosis     Tobacco Use: Social History   Tobacco Use  Smoking Status Former   Types: Cigarettes   Quit date: 11/30/1974   Years since quitting: 47.8   Passive exposure: Never  Smokeless Tobacco Never    Labs: Review Flowsheet  More data exists      Latest Ref Rng & Units 12/04/2021 03/10/2022 05/07/2022 07/10/2022 07/14/2022  Labs for ITP Cardiac and Pulmonary Rehab  Cholestrol 0 - 200 mg/dL 152  - - - -  LDL (calc) 0 - 99 mg/dL 93  - - - -  HDL-C >39.00 mg/dL 40.20  - - - -  Trlycerides 0.0 - 149.0 mg/dL 96.0  - - - -  Hemoglobin A1c 4.8 - 5.6 % 8.4  8.1  - 7.2  -  PH, Arterial 7.35 - 7.45 -  - 7.311  - 7.372   PCO2 arterial 32 - 48 mmHg - - 51.5  - 46.2   Bicarbonate 20.0 - 28.0 mmol/L - - 26.3  26.0  - 26.8   TCO2 22 - 32 mmol/L - - 28  28  - _0 Acid-base deficit 0.0 - 2.0 mmol/L 0.0 - 2.0 mmol/L - - 1.0  1.0  - -  O2 Saturation % - - 78  98  - 100     Capillary Blood Glucose: Lab Results  Component Value Date   GLUCAP 140 (H) 09/07/2022   GLUCAP 133 (H) 09/07/2022   GLUCAP 219 (H) 09/03/2022   GLUCAP 117 (H) 07/15/2022   GLUCAP 206 (H) 07/15/2022     Exercise Target Goals: Exercise Program Goal: Individual exercise prescription set using results from initial 6 min walk test and THRR while considering  patient's activity barriers and safety.   Exercise Prescription Goal: Initial exercise prescription builds to 30-45 minutes a day of aerobic activity, 2-3 days per week.  Home exercise guidelines will be given to patient during program as part of exercise prescription that the participant will acknowledge.  Activity Barriers & Risk Stratification:  Activity Barriers & Cardiac Risk Stratification - 09/03/22 1441       Activity Barriers & Cardiac Risk Stratification   Activity Barriers Back Problems;Joint Problems;Balance Concerns;Deconditioning;History of Falls;Muscular Weakness;Assistive Device    Cardiac Risk Stratification High             6 Minute Walk:  6 Minute Walk     Row Name 09/03/22 1436         6 Minute Walk   Phase Initial  Nustep Stepper test: Assistive device, previous falls, instability, balance concerns     Distance 821 feet     Walk Time 6 minutes     # of Rest Breaks 0     MPH 1.55     METS 0.5     RPE 13     Perceived Dyspnea  0     VO2 Peak 1.75     Symptoms No     Resting HR 77 bpm     Resting BP 118/78     Resting Oxygen Saturation  97 %     Exercise Oxygen Saturation  during 6 min walk 95 %     Max Ex. HR 67 bpm     Max Ex. BP 124/76     2 Minute Post BP 128/68              Oxygen Initial  Assessment:   Oxygen Re-Evaluation:   Oxygen  Discharge (Final Oxygen Re-Evaluation):   Initial Exercise Prescription:  Initial Exercise Prescription - 09/07/22 1255       NuStep   SPM 53             Perform Capillary Blood Glucose checks as needed.  Exercise Prescription Changes:   Exercise Prescription Changes     Row Name 09/07/22 1255             Response to Exercise   Blood Pressure (Admit) 142/70       Blood Pressure (Exercise) 140/62       Blood Pressure (Exit) 122/50       Heart Rate (Admit) 61 bpm       Heart Rate (Exercise) 81 bpm       Heart Rate (Exit) 61 bpm       Rating of Perceived Exertion (Exercise) 9       Perceived Dyspnea (Exercise) 0       Symptoms 0       Comments Pt first day in teh CRP2 program       Duration Progress to 30 minutes of  aerobic without signs/symptoms of physical distress       Intensity THRR unchanged         Progression   Progression Continue to progress workloads to maintain intensity without signs/symptoms of physical distress.       Average METs 1.3         Resistance Training   Training Prescription Yes       Weight 3       Reps 10-15       Time 10 Minutes         NuStep   Level 1       Minutes 25       METs 1.3                Exercise Comments:   Exercise Comments     Row Name 09/07/22 1259           Exercise Comments Pt first day in the CRP2 program. Pt tolerated low intensity exercise well with an average MET level of 1.3. Pt is learning his THRR, RPE and ExRx                Exercise Goals and Review:   Exercise Goals     Row Name 09/03/22 1443             Exercise Goals   Increase Physical Activity Yes       Intervention Provide advice, education, support and counseling about physical activity/exercise needs.;Develop an individualized exercise prescription for aerobic and resistive training based on initial evaluation findings, risk stratification, comorbidities and  participant's personal goals.       Expected Outcomes Short Term: Attend rehab on a regular basis to increase amount of physical activity.;Long Term: Add in home exercise to make exercise part of routine and to increase amount of physical activity.;Long Term: Exercising regularly at least 3-5 days a week.       Increase Strength and Stamina Yes       Intervention Provide advice, education, support and counseling about physical activity/exercise needs.;Develop an individualized exercise prescription for aerobic and resistive training based on initial evaluation findings, risk stratification, comorbidities and participant's personal goals.       Expected Outcomes Short Term: Increase workloads from initial exercise prescription for resistance, speed, and METs.;Short Term: Perform resistance training exercises routinely during rehab and add in  resistance training at home;Long Term: Improve cardiorespiratory fitness, muscular endurance and strength as measured by increased METs and functional capacity (6MWT)       Able to understand and use rate of perceived exertion (RPE) scale Yes       Intervention Provide education and explanation on how to use RPE scale       Expected Outcomes Short Term: Able to use RPE daily in rehab to express subjective intensity level;Long Term:  Able to use RPE to guide intensity level when exercising independently       Knowledge and understanding of Target Heart Rate Range (THRR) Yes       Intervention Provide education and explanation of THRR including how the numbers were predicted and where they are located for reference       Expected Outcomes Short Term: Able to state/look up THRR;Long Term: Able to use THRR to govern intensity when exercising independently;Short Term: Able to use daily as guideline for intensity in rehab       Understanding of Exercise Prescription Yes       Intervention Provide education, explanation, and written materials on patient's individual exercise  prescription       Expected Outcomes Short Term: Able to explain program exercise prescription;Long Term: Able to explain home exercise prescription to exercise independently                Exercise Goals Re-Evaluation :  Exercise Goals Re-Evaluation     Row Name 09/07/22 1257             Exercise Goal Re-Evaluation   Exercise Goals Review Increase Physical Activity;Increase Strength and Stamina;Able to understand and use rate of perceived exertion (RPE) scale;Knowledge and understanding of Target Heart Rate Range (THRR);Understanding of Exercise Prescription       Comments Pt first day in the CRP2 program. Pt tolerated low intensity exercise well with an average MET level of 1.3. Pt is learning his THRR, RPE and ExRx       Expected Outcomes Will continue to monitor pt and progress workloads as tolerated without sign or symptom                Discharge Exercise Prescription (Final Exercise Prescription Changes):  Exercise Prescription Changes - 09/07/22 1255       Response to Exercise   Blood Pressure (Admit) 142/70    Blood Pressure (Exercise) 140/62    Blood Pressure (Exit) 122/50    Heart Rate (Admit) 61 bpm    Heart Rate (Exercise) 81 bpm    Heart Rate (Exit) 61 bpm    Rating of Perceived Exertion (Exercise) 9    Perceived Dyspnea (Exercise) 0    Symptoms 0    Comments Pt first day in teh CRP2 program    Duration Progress to 30 minutes of  aerobic without signs/symptoms of physical distress    Intensity THRR unchanged      Progression   Progression Continue to progress workloads to maintain intensity without signs/symptoms of physical distress.    Average METs 1.3      Resistance Training   Training Prescription Yes    Weight 3    Reps 10-15    Time 10 Minutes      NuStep   Level 1    Minutes 25    METs 1.3             Nutrition:  Target Goals: Understanding of nutrition guidelines, daily intake of sodium <1500mg, cholesterol <200mg, calories    30% from fat and 7% or less from saturated fats, daily to have 5 or more servings of fruits and vegetables.  Biometrics:  Pre Biometrics - 09/03/22 1444       Pre Biometrics   Waist Circumference 51 inches    Hip Circumference 45 inches    Waist to Hip Ratio 1.13 %    Triceps Skinfold 12 mm    % Body Fat 35.6 %    Grip Strength 31 kg    Flexibility --   Attempted but unable to reach.   Single Leg Stand --   Did not attempt, asssistive device, previous falls and stability issues             Nutrition Therapy Plan and Nutrition Goals:  Nutrition Therapy & Goals - 09/07/22 1151       Nutrition Therapy   Diet Heart Healthy/Carbohydrate Consistent diet    Drug/Food Interactions Statins/Certain Fruits      Personal Nutrition Goals   Nutrition Goal Patient to identify strategies for managin cardiovascular risk by attending the weekly Pritikin education and nutrition series    Personal Goal #2 Patient to identify food sources and limit daily intake of saturated fat, trans fat, sodium, and refined carbohydrates    Personal Goal #3 Patient to limit to <1535m of sodium daily    Comments Ryan Duke is precontemplative of lifestyle changes to aid with blood sugar control and heart health. His wife, CRosemarie Ax plans to attend the Pritikin education series as well. She does the majority of the grocery shopping and cooking.      Intervention Plan   Intervention Prescribe, educate and counsel regarding individualized specific dietary modifications aiming towards targeted core components such as weight, hypertension, lipid management, diabetes, heart failure and other comorbidities.;Nutrition handout(s) given to patient.    Expected Outcomes Short Term Goal: Understand basic principles of dietary content, such as calories, fat, sodium, cholesterol and nutrients.;Long Term Goal: Adherence to prescribed nutrition plan.             Nutrition Assessments:  Nutrition Assessments - 09/07/22 1206        Rate Your Plate Scores   Pre Score 50            MEDIFICTS Score Key: ?70 Need to make dietary changes  40-70 Heart Healthy Diet ? 40 Therapeutic Level Cholesterol Diet   Flowsheet Row INTENSIVE CARDIAC REHAB [place]07/20[place] Picture Your Plate Total Score on Admission 50      Picture Your Plate Scores: <<97Unhealthy dietary pattern with much room for improvement. 41-50 Dietary pattern unlikely to meet recommendations for good health and room for improvement. 51-60 More healthful dietary pattern, with some room for improvement.  >60 Healthy dietary pattern, although there may be some specific behaviors that could be improved.    Nutrition Goals Re-Evaluation:  Nutrition Goals Re-Evaluation     [place] ShannonName 09/07/22 1151             Goals   Current Weight 229 lb 4.5 oz (104 kg)       Comment GFR 42, Cr 1.62, A1c 7.2, lipids WNL       Expected Outcome Ryan Duke is precontemplative of lifestyle changes to aid with blood sugar control and heart health. His wife, CRosemarie Ax plans to attend the Pritikin education series as well. She does the majority of the grocery shopping and cooking; she is very supportive of make dietary changes.  Nutrition Goals Re-Evaluation:  Nutrition Goals Re-Evaluation     [place] Name 09/07/22 1151             Goals   Current Weight 229 lb 4.5 oz (104 kg)       Comment GFR 42, Cr 1.62, A1c 7.2, lipids WNL       Expected Outcome Ryan Duke is precontemplative of lifestyle changes to aid with blood sugar control and heart health. His wife, Rosemarie Ax, plans to attend the Prit[employer]in education series as well. She does the majority of the grocery shopping and cooking; she is very supportive of make dietary changes.                Nutrition Goals Discharge (Final Nutrition Goals Re-Evaluation):  Nutrition Goals Re-Evaluation - 09/07/22 1151       Goals   Current Weight 229 lb 4.5 oz  (104 kg)    Comment GFR 42, Cr 1.62, A1c 7.2, lipids WNL    Expected Outcome Ryan Duke is precontemplative of lifestyle changes to aid with blood sugar control and heart health. His wife, Rosemarie Ax, plans to attend the Pritikin education series as well. She does the majority of the grocery shopping and cooking; she is very supportive of make dietary changes.             Psychosocial: Target Goals: Acknowledge presence or absence of significant depression and/or stress, maximize coping skills, provide positive support system. Participant is able to verbalize types and ability to use techniques and skills needed for reducing stress and depression.  Initial Review & Psychosocial Screening:  Initial Psych Review & Screening - 09/03/22 1344       Initial Review   Current issues with Current Stress Concerns    Source of Stress Concerns Chronic Illness;Unable to participate in former interests or hobbies;Unable to perform yard/household activities    Comments Ryan Duke denies being currently depressed. Wife says that Ryan Duke has been feeling down recently      [place]? Yes   Ryan Duke has his wife for support     Barriers   Psychosocial barriers to participate in program The patient should benefit from training in stress management and relaxation.      Screening Interventions   Interventions Encouraged to exercise;To provide support and resources with identified psychosocial needs    Expected Outcomes Long Term Goal: Stressors or current issues are controlled or eliminated.             Quality of Life Scores:  Quality of Life - 09/03/22 1447       Quality of Life   Select Quality of Life      Quality of Life Scores   Health/Function Pre 23.71 %    Socioeconomic Pre 30 %    Psych/Spiritual Pre 28.93 %    Family Pre 30 %    GLOBAL Pre 26.71 %            Scores of 19 and below usually indicate a poorer quality of life in these areas.  A difference of  2-3  points is a clinically meaningful difference.  A difference of 2-3 points in the total score of the Quality of Life Index has been associated with significant improvement in overall quality of life, self-image, physical symptoms, and general health in studies assessing change in quality of life.  PHQ-9: Review Flowsheet  More data exists      09/03/2022 03/02/2022 04/21/2021 02/28/2020 12/04/2019  Depression screen PHQ 2/9  Decreased Interest 0 0 0 0 0 2  Down, Depressed, Hopeless 0 0 0 0 0 1  PHQ - 2 Score 0 0 0 0 0 3  Altered sleeping - - - - 1  Tired, decreased energy - - - - 1  Change in appetite - - - - 2  Feeling bad or failure about yourself  - - - - 1  Trouble concentrating - - - - 1  Moving slowly or fidgety/restless - - - - 0  Suicidal thoughts - - - - 0  PHQ-9 Score - - - - 9  Difficult doing work/chores - - - - Not difficult at all   Interpretation of Total Score  Total Score Depression Severity:  1-4 = Minimal depression, 5-9 = Mild depression, 10-14 = Moderate depression, 15-19 = Moderately severe depression, 20-27 = Severe depression   Psychosocial Evaluation and Intervention:   Psychosocial Re-Evaluation:  Psychosocial Re-Evaluation     Row Name 09/07/22 1353             Psychosocial Re-Evaluation   Current issues with History of Depression;Current Stress Concerns       Comments Ryan Duke denies being depressed currently. Has a history of depression. Ryan Duke has stress concerns regarding his health       Expected Outcomes Ryan Duke will have decreased stress and controlled depression upon completion of intensive cardiac rehab       Interventions Stress management education;Relaxation education;Encouraged to attend Cardiac Rehabilitation for the exercise       Continue Psychosocial Services  Follow up required by staff         Initial Review   Source of Stress Concerns Chronic Illness;Unable to perform yard/household activities;Unable to participate in former interests or  hobbies       Comments Will contibue to monitor and offer support as needed                Psychosocial Discharge (Final Psychosocial Re-Evaluation):  Psychosocial Re-Evaluation - 09/07/22 1353       Psychosocial Re-Evaluation   Current issues with History of Depression;Current Stress Concerns    Comments Ryan Duke denies being depressed currently. Has a history of depression. Ryan Duke has stress concerns regarding his health    Expected Outcomes Ryan Duke will have decreased stress and controlled depression upon completion of intensive cardiac rehab    Interventions Stress management education;Relaxation education;Encouraged to attend Cardiac Rehabilitation for the exercise    Continue Psychosocial Services  Follow up required by staff      Initial Review   Source of Stress Concerns Chronic Illness;Unable to perform yard/household activities;Unable to participate in former interests or hobbies    Comments Will contibue to monitor and offer support as needed             Vocational Rehabilitation: Provide vocational rehab assistance to qualifying candidates.   Vocational Rehab Evaluation & Intervention:  Vocational Rehab - 09/03/22 1346       Initial Vocational Rehab Evaluation & Intervention   Assessment shows need for Vocational Rehabilitation No   Ryan Duke is retired and does not need vocational rehab at this time            Education: Education Goals: Education classes will be provided on a weekly basis, covering required topics. Participant will state understanding/return demonstration of topics presented.    Education     Row Name 09/07/22 1200     Education   Cardiac Education Topics Pritikin   Select Core Videos       Core Videos   Educator Exercise Physiologist   Select Exercise Education   Exercise Education Biomechanial Limitations   Instruction Review Code 1- Verbalizes Understanding   Class Start Time 1140   Class Stop Time 1216   Class Time Calculation (min)  36 min            Core Videos: Exercise    Move It!  Clinical staff conducted group or individual video education with verbal and written material and guidebook.  Patient learns the recommended Pritikin exercise program. Exercise with the goal of living a long, healthy life. Some of the health benefits of exercise include controlled diabetes, healthier blood pressure levels, improved cholesterol levels, improved heart and lung capacity, improved sleep, and better body composition. Everyone should speak with their doctor before starting or changing an exercise routine.  Biomechanical Limitations Clinical staff conducted group or individual video education with verbal and written material and guidebook.  Patient learns how biomechanical limitations can impact exercise and how we can mitigate and possibly overcome limitations to have an impactful and balanced exercise routine.  Body Composition Clinical staff conducted group or individual video education with verbal and written material and guidebook.  Patient learns that body composition (ratio of muscle mass to fat mass) is a key component to assessing overall fitness, rather than body weight alone. Increased fat mass, especially visceral belly fat, can put us at increased risk for metabolic syndrome, type 2 diabetes, heart disease, and even death. It is recommended to combine diet and exercise (cardiovascular and resistance training) to improve your body composition. Seek guidance from your physician and exercise physiologist before implementing an exercise routine.  Exercise Action Plan Clinical staff conducted group or individual video education with verbal and written material and guidebook.  Patient learns the recommended strategies to achieve and enjoy long-term exercise adherence, including variety, self-motivation, self-efficacy, and positive decision making. Benefits of exercise include fitness, good health, weight management, more  energy, better sleep, less stress, and overall well-being.  Medical   Heart Disease Risk Reduction Clinical staff conducted group or individual video education with verbal and written material and guidebook.  Patient learns our heart is our most vital organ as it circulates oxygen, nutrients, white blood cells, and hormones throughout the entire body, and carries waste away. Data supports a plant-based eating plan like the Pritikin Program for its effectiveness in slowing progression of and reversing heart disease. The video provides a number of recommendations to address heart disease.   Metabolic Syndrome and Belly Fat  Clinical staff conducted group or individual video education with verbal and written material and guidebook.  Patient learns what metabolic syndrome is, how it leads to heart disease, and how one can reverse it and keep it from coming back. You have metabolic syndrome if you have 3 of the following 5 criteria: abdominal obesity, high blood pressure, high triglycerides, low HDL cholesterol, and high blood sugar.  Hypertension and Heart Disease Clinical staff conducted group or individual video education with verbal and written material and guidebook.  Patient learns that high blood pressure, or hypertension, is very common in the [place] [place]. Hypertension is largely due to excessive salt intake, but other important risk factors include being overweight, physical inactivity, drinking too much alcohol, smoking, and not eating enough potassium from fruits and vegetables. High blood pressure is a leading risk factor for heart attack, stroke, congestive heart failure, dementia, kidney failure, and premature death. Long-term effects of excessive salt intake include stiffening of the arteries   and thickening of heart muscle and organ damage. Recommendations include ways to reduce hypertension and the risk of heart disease.  Diseases of Our Time - Focusing on Diabetes Clinical staff  conducted group or individual video education with verbal and written material and guidebook.  Patient learns why the best way to stop diseases of our time is prevention, through food and other lifestyle changes. Medicine (such as prescription pills and surgeries) is often only a Band-Aid on the problem, not a long-term solution. Most common diseases of our time include obesity, type 2 diabetes, hypertension, heart disease, and cancer. The Pritikin Program is recommended and has been proven to help reduce, reverse, and/or prevent the damaging effects of metabolic syndrome.  Nutrition   Overview of the Pritikin Eating Plan  Clinical staff conducted group or individual video education with verbal and written material and guidebook.  Patient learns about the Pritikin Eating Plan for disease risk reduction. The Pritikin Eating Plan emphasizes a wide variety of unrefined, minimally-processed carbohydrates, like fruits, vegetables, whole grains, and legumes. Go, Caution, and Stop food choices are explained. Plant-based and lean animal proteins are emphasized. Rationale provided for low sodium intake for blood pressure control, low added sugars for blood sugar stabilization, and low added fats and oils for coronary artery disease risk reduction and weight management.  Calorie Density  Clinical staff conducted group or individual video education with verbal and written material and guidebook.  Patient learns about calorie density and how it impacts the Pritikin Eating Plan. Knowing the characteristics of the food you choose will help you decide whether those foods will lead to weight gain or weight loss, and whether you want to consume more or less of them. Weight loss is usually a side effect of the Pritikin Eating Plan because of its focus on low calorie-dense foods.  Label Reading  Clinical staff conducted group or individual video education with verbal and written material and guidebook.  Patient learns  about the Pritikin recommended label reading guidelines and corresponding recommendations regarding calorie density, added sugars, sodium content, and whole grains.  Dining Out - Part 1  Clinical staff conducted group or individual video education with verbal and written material and guidebook.  Patient learns that restaurant meals can be sabotaging because they can be so high in calories, fat, sodium, and/or sugar. Patient learns recommended strategies on how to positively address this and avoid unhealthy pitfalls.  Facts on Fats  Clinical staff conducted group or individual video education with verbal and written material and guidebook.  Patient learns that lifestyle modifications can be just as effective, if not more so, as many medications for lowering your risk of heart disease. A Pritikin lifestyle can help to reduce your risk of inflammation and atherosclerosis (cholesterol build-up, or plaque, in the artery walls). Lifestyle interventions such as dietary choices and physical activity address the cause of atherosclerosis. A review of the types of fats and their impact on blood cholesterol levels, along with dietary recommendations to reduce fat intake is also included.  Nutrition Action Plan  Clinical staff conducted group or individual video education with verbal and written material and guidebook.  Patient learns how to incorporate Pritikin recommendations into their lifestyle. Recommendations include planning and keeping personal health goals in mind as an important part of their success.  Healthy Mind-Set    Healthy Minds, Bodies, Hearts  Clinical staff conducted group or individual video education with verbal and written material and guidebook.  Patient learns how to identify when they   are stressed. Video will discuss the impact of that stress, as well as the many benefits of stress management. Patient will also be introduced to stress management techniques. The way we think, act, and  feel has an impact on our hearts.  How Our Thoughts Can Heal Our Hearts  Clinical staff conducted group or individual video education with verbal and written material and guidebook.  Patient learns that negative thoughts can cause depression and anxiety. This can result in negative lifestyle behavior and serious health problems. Cognitive behavioral therapy is an effective method to help control our thoughts in order to change and improve our emotional outlook.  Additional Videos:  Exercise    Improving Performance  Clinical staff conducted group or individual video education with verbal and written material and guidebook.  Patient learns to use a non-linear approach by alternating intensity levels and lengths of time spent exercising to help burn more calories and lose more body fat. Cardiovascular exercise helps improve heart health, metabolism, hormonal balance, blood sugar control, and recovery from fatigue. Resistance training improves strength, endurance, balance, coordination, reaction time, metabolism, and muscle mass. Flexibility exercise improves circulation, posture, and balance. Seek guidance from your physician and exercise physiologist before implementing an exercise routine and learn your capabilities and proper form for all exercise.  Introduction to Yoga  Clinical staff conducted group or individual video education with verbal and written material and guidebook.  Patient learns about yoga, a discipline of the coming together of mind, breath, and body. The benefits of yoga include improved flexibility, improved range of motion, better posture and core strength, increased lung function, weight loss, and positive self-image. Yoga's heart health benefits include lowered blood pressure, healthier heart rate, decreased cholesterol and triglyceride levels, improved immune function, and reduced stress. Seek guidance from your physician and exercise physiologist before implementing an exercise  routine and learn your capabilities and proper form for all exercise.  Medical   Aging: Enhancing Your Quality of Life  Clinical staff conducted group or individual video education with verbal and written material and guidebook.  Patient learns key strategies and recommendations to stay in good physical health and enhance quality of life, such as prevention strategies, having an advocate, securing a Health Care Proxy and Power of Attorney, and keeping a list of medications and system for tracking them. It also discusses how to avoid risk for bone loss.  Biology of Weight Control  Clinical staff conducted group or individual video education with verbal and written material and guidebook.  Patient learns that weight gain occurs because we consume more calories than we burn (eating more, moving less). Even if your body weight is normal, you may have higher ratios of fat compared to muscle mass. Too much body fat puts you at increased risk for cardiovascular disease, heart attack, stroke, type 2 diabetes, and obesity-related cancers. In addition to exercise, following the Pritikin Eating Plan can help reduce your risk.  Decoding Lab Results  Clinical staff conducted group or individual video education with verbal and written material and guidebook.  Patient learns that lab test reflects one measurement whose values change over time and are influenced by many factors, including medication, stress, sleep, exercise, food, hydration, pre-existing medical conditions, and more. It is recommended to use the knowledge from this video to become more involved with your lab results and evaluate your numbers to speak with your doctor.   Diseases of Our Time - Overview  Clinical staff conducted group or individual video education with verbal   and written material and guidebook.  Patient learns that according to the CDC, 50% to 70% of chronic diseases (such as obesity, type 2 diabetes, elevated lipids, hypertension,  and heart disease) are avoidable through lifestyle improvements including healthier food choices, listening to satiety cues, and increased physical activity.  Sleep Disorders Clinical staff conducted group or individual video education with verbal and written material and guidebook.  Patient learns how good quality and duration of sleep are important to overall health and well-being. Patient also learns about sleep disorders and how they impact health along with recommendations to address them, including discussing with a physician.  Nutrition  Dining Out - Part 2 Clinical staff conducted group or individual video education with verbal and written material and guidebook.  Patient learns how to plan ahead and communicate in order to maximize their dining experience in a healthy and nutritious manner. Included are recommended food choices based on the type of restaurant the patient is visiting.   Fueling a Best boy conducted group or individual video education with verbal and written material and guidebook.  There is a strong connection between our food choices and our health. Diseases like obesity and type 2 diabetes are very prevalent and are in large-part due to lifestyle choices. The Pritikin Eating Plan provides plenty of food and hunger-curbing satisfaction. It is easy to follow, affordable, and helps reduce health risks.  Menu Workshop  Clinical staff conducted group or individual video education with verbal and written material and guidebook.  Patient learns that restaurant meals can sabotage health goals because they are often packed with calories, fat, sodium, and sugar. Recommendations include strategies to plan ahead and to communicate with the manager, chef, or server to help order a healthier meal.  Planning Your Eating Strategy  Clinical staff conducted group or individual video education with verbal and written material and guidebook.  Patient learns about the  [place] and its benefit of reducing the risk of disease. The [place] does not focus on calories. Instead, it emphasizes high-quality, nutrient-rich foods. By knowing the characteristics of the foods, we choose, we can determine their calorie density and make informed decisions.  Targeting Your Nutrition Priorities  Clinical staff conducted group or individual video education with verbal and written material and guidebook.  Patient learns that lifestyle habits have a tremendous impact on disease risk and progression. This video provides eating and physical activity recommendations based on your personal health goals, such as reducing LDL cholesterol, losing weight, preventing or controlling type 2 diabetes, and reducing high blood pressure.  Vitamins and Minerals  Clinical staff conducted group or individual video education with verbal and written material and guidebook.  Patient learns different ways to obtain key vitamins and minerals, including through a recommended healthy diet. It is important to discuss all supplements you take with your doctor.   Healthy Mind-Set    Smoking Cessation  Clinical staff conducted group or individual video education with verbal and written material and guidebook.  Patient learns that cigarette smoking and tobacco addiction pose a serious health risk which affects millions of people. Stopping smoking will significantly reduce the risk of heart disease, lung disease, and many forms of cancer. Recommended strategies for quitting are covered, including working with your doctor to develop a successful plan.  Culinary   Becoming a Financial trader conducted group or individual video education with verbal and written material and guidebook.  Patient learns that cooking at home  can be healthy, cost-effective, quick, and puts them in control. Keys to cooking healthy recipes will include looking at your recipe, assessing your  equipment needs, planning ahead, making it simple, choosing cost-effective seasonal ingredients, and limiting the use of added fats, salts, and sugars.  Cooking - Breakfast and Snacks  Clinical staff conducted group or individual video education with verbal and written material and guidebook.  Patient learns how important breakfast is to satiety and nutrition through the entire day. Recommendations include key foods to eat during breakfast to help stabilize blood sugar levels and to prevent overeating at meals later in the day. Planning ahead is also a key component.  Cooking - Dinner and Sides  Clinical staff conducted group or individual video education with verbal and written material and guidebook.  Patient learns eating strategies to improve overall health, including an approach to cook more at home. Recommendations include thinking of animal protein as a side on your plate rather than center stage and focusing instead on lower calorie dense options like vegetables, fruits, whole grains, and plant-based proteins, such as beans. Making sauces in large quantities to freeze for later and leaving the skin on your vegetables are also recommended to maximize your experience.  Cooking - Healthy Salads and Dressing Clinical staff conducted group or individual video education with verbal and written material and guidebook.  Patient learns that vegetables, fruits, whole grains, and legumes are the foundations of the Pritikin Eating Plan. Recommendations include how to incorporate each of these in flavorful and healthy salads, and how to create homemade salad dressings. Proper handling of ingredients is also covered. Cooking - Soups and Desserts  Cooking - Soups and Desserts Clinical staff conducted group or individual video education with verbal and written material and guidebook.  Patient learns that Pritikin soups and desserts make for easy, nutritious, and delicious snacks and meal components that are  low in sodium, fat, sugar, and calorie density, while high in vitamins, minerals, and filling fiber. Recommendations include simple and healthy ideas for soups and desserts.   Overview     The Pritikin Solution Program Overview Clinical staff conducted group or individual video education with verbal and written material and guidebook.  Patient learns that the results of the Pritikin Program have been documented in more than 100 articles published in peer-reviewed journals, and the benefits include reducing risk factors for (and, in some cases, even reversing) high cholesterol, high blood pressure, type 2 diabetes, obesity, and more! An overview of the three key pillars of the Pritikin Program will be covered: eating well, doing regular exercise, and having a healthy mind-set.  WORKSHOPS  Exercise: Exercise Basics: Building Your Action Plan Clinical staff led group instruction and group discussion with PowerPoint presentation and patient guidebook. To enhance the learning environment the use of posters, models and videos may be added. At the conclusion of this workshop, patients will comprehend the difference between physical activity and exercise, as well as the benefits of incorporating both, into their routine. Patients will understand the FITT (Frequency, Intensity, Time, and Type) principle and how to use it to build an exercise action plan. In addition, safety concerns and other considerations for exercise and cardiac rehab will be addressed by the presenter. The purpose of this lesson is to promote a comprehensive and effective weekly exercise routine in order to improve patients' overall level of fitness.   Managing Heart Disease: Your Path to a Healthier Heart Clinical staff led group instruction and group discussion with PowerPoint presentation   and patient guidebook. To enhance the learning environment the use of posters, models and videos may be added.At the conclusion of this workshop,  patients will understand the anatomy and physiology of the heart. Additionally, they will understand how Pritikin's three pillars impact the risk factors, the progression, and the management of heart disease.  The purpose of this lesson is to provide a high-level overview of the heart, heart disease, and how the Pritikin lifestyle positively impacts risk factors.  Exercise Biomechanics Clinical staff led group instruction and group discussion with PowerPoint presentation and patient guidebook. To enhance the learning environment the use of posters, models and videos may be added. Patients will learn how the structural parts of their bodies function and how these functions impact their daily activities, movement, and exercise. Patients will learn how to promote a neutral spine, learn how to manage pain, and identify ways to improve their physical movement in order to promote healthy living. The purpose of this lesson is to expose patients to common physical limitations that impact physical activity. Participants will learn practical ways to adapt and manage aches and pains, and to minimize their effect on regular exercise. Patients will learn how to maintain good posture while sitting, walking, and lifting.  Balance Training and Fall Prevention  Clinical staff led group instruction and group discussion with PowerPoint presentation and patient guidebook. To enhance the learning environment the use of posters, models and videos may be added. At the conclusion of this workshop, patients will understand the importance of their sensorimotor skills (vision, proprioception, and the vestibular system) in maintaining their ability to balance as they age. Patients will apply a variety of balancing exercises that are appropriate for their current level of function. Patients will understand the common causes for poor balance, possible solutions to these problems, and ways to modify their physical environment  in order to minimize their fall risk. The purpose of this lesson is to teach patients about the importance of maintaining balance as they age and ways to minimize their risk of falling.  WORKSHOPS   Nutrition:  Fueling a Scientist, research (physical sciences) led group instruction and group discussion with PowerPoint presentation and patient guidebook. To enhance the learning environment the use of posters, models and videos may be added. Patients will review the foundational principles of the [place] and understand what constitutes a serving size in each of the food groups. Patients will also learn Pritikin-friendly foods that are better choices when away from home and review make-ahead meal and snack options. Calorie density will be reviewed and applied to three nutrition priorities: weight maintenance, weight loss, and weight gain. The purpose of this lesson is to reinforce (in a group setting) the key concepts around what patients are recommended to eat and how to apply these guidelines when away from home by planning and selecting Pritikin-friendly options. Patients will understand how calorie density may be adjusted for different weight management goals.  Mindful Eating  Clinical staff led group instruction and group discussion with PowerPoint presentation and patient guidebook. To enhance the learning environment the use of posters, models and videos may be added. Patients will briefly review the concepts of the [place] and the importance of low-calorie dense foods. The concept of mindful eating will be introduced as well as the importance of paying attention to internal hunger signals. Triggers for non-hunger eating and techniques for dealing with triggers will be explored. The purpose of this lesson is to provide patients with the opportunity  to review the basic principles of the Pritikin Eating Plan, discuss the value of eating mindfully and how to measure internal cues of hunger  and fullness using the Hunger Scale. Patients will also discuss reasons for non-hunger eating and learn strategies to use for controlling emotional eating.  Targeting Your Nutrition Priorities Clinical staff led group instruction and group discussion with PowerPoint presentation and patient guidebook. To enhance the learning environment the use of posters, models and videos may be added. Patients will learn how to determine their genetic susceptibility to disease by reviewing their family history. Patients will gain insight into the importance of diet as part of an overall healthy lifestyle in mitigating the impact of genetics and other environmental insults. The purpose of this lesson is to provide patients with the opportunity to assess their personal nutrition priorities by looking at their family history, their own health history and current risk factors. Patients will also be able to discuss ways of prioritizing and modifying the Pritikin Eating Plan for their highest risk areas  Menu  Clinical staff led group instruction and group discussion with PowerPoint presentation and patient guidebook. To enhance the learning environment the use of posters, models and videos may be added. Using menus brought in from local restaurants, or printed from online menus, patients will apply the Pritikin dining out guidelines that were presented in the Pritikin Dining Out educational video. Patients will also be able to practice these guidelines in a variety of provided scenarios. The purpose of this lesson is to provide patients with the opportunity to practice hands-on learning of the Pritikin Dining Out guidelines with actual menus and practice scenarios.  Label Reading Clinical staff led group instruction and group discussion with PowerPoint presentation and patient guidebook. To enhance the learning environment the use of posters, models and videos may be added. Patients will review and discuss the Pritikin label  reading guidelines presented in Pritikin's Label Reading Educational series video. Using fool labels brought in from local grocery stores and markets, patients will apply the label reading guidelines and determine if the packaged food meet the Pritikin guidelines. The purpose of this lesson is to provide patients with the opportunity to review, discuss, and practice hands-on learning of the Pritikin Label Reading guidelines with actual packaged food labels. Cooking School  Pritikin's Cooking School Workshops are designed to teach patients ways to prepare quick, simple, and affordable recipes at home. The importance of nutrition's role in chronic disease risk reduction is reflected in its emphasis in the overall Pritikin program. By learning how to prepare essential core Pritikin Eating Plan recipes, patients will increase control over what they eat; be able to customize the flavor of foods without the use of added salt, sugar, or fat; and improve the quality of the food they consume. By learning a set of core recipes which are easily assembled, quickly prepared, and affordable, patients are more likely to prepare more healthy foods at home. These workshops focus on convenient breakfasts, simple entres, side dishes, and desserts which can be prepared with minimal effort and are consistent with nutrition recommendations for cardiovascular risk reduction. Cooking School Workshops are taught by a chef or registered dietitian (RD) who has been trained by the Pritikin [facility] culinary team. The chef or RD has a clear understanding of the importance of minimizing - if not completely eliminating - added fat, sugar, and sodium in recipes. Throughout the series of Cooking School Workshop sessions, patients will learn about healthy ingredients and efficient methods of cooking   to build confidence in their capability to prepare    Cooking School weekly topics:  Adding Flavor- Sodium-Free  Fast and Healthy  Breakfasts  Powerhouse Plant-Based Proteins  Satisfying Salads and Dressings  Simple Sides and Sauces  International Cuisine-Spotlight on the [place] Zones  Delicious Desserts  Savory Soups  [employer] and [employer] - Meals in a Snap  Tasty Appetizers and Snacks  Comforting Weekend Breakfasts  One-Pot Wonders   Fast Evening Meals  Easy Entertaining  Personalizing Your Pritikin Plate  WORKSHOPS   Healthy Mindset (Psychosocial): New Thoughts, New Behaviors Clinical staff led group instruction and group discussion with PowerPoint presentation and patient guidebook. To enhance the learning environment the use of posters, models and videos may be added. Patients will learn and practice techniques for developing effective health and lifestyle goals. Patients will be able to effectively apply the goal setting process learned to develop at least one new personal goal.  The purpose of this lesson is to expose patients to a new skill set of behavior modification techniques such as techniques setting SMART goals, overcoming barriers, and achieving new thoughts and new behaviors.  Managing Moods and Relationships Clinical staff led group instruction and group discussion with PowerPoint presentation and patient guidebook. To enhance the learning environment the use of posters, models and videos may be added. Patients will learn how emotional and chronic stress factors can impact their health and relationships. They will learn healthy ways to manage their moods and utilize positive coping mechanisms. In addition, ICR patients will learn ways to improve communication skills. The purpose of this lesson is to expose patients to ways of understanding how one's mood and health are intimately connected. Developing a healthy outlook can help build positive relationships and connections with others. Patients will understand the importance of utilizing effective communication skills that include actively listening and being  heard. They will learn and understand the importance of the "4 Cs" and especially Connections in fostering of a Healthy Mind-Set.  Healthy Sleep for a Healthy Heart Clinical staff led group instruction and group discussion with PowerPoint presentation and patient guidebook. To enhance the learning environment the use of posters, models and videos may be added. At the conclusion of this workshop, patients will be able to demonstrate knowledge of the importance of sleep to overall health, well-being, and quality of life. They will understand the symptoms of, and treatments for, common sleep disorders. Patients will also be able to identify daytime and nighttime behaviors which impact sleep, and they will be able to apply these tools to help manage sleep-related challenges. The purpose of this lesson is to provide patients with a general overview of sleep and outline the importance of quality sleep. Patients will learn about a few of the most common sleep disorders. Patients will also be introduced to the concept of "sleep hygiene," and discover ways to self-manage certain sleeping problems through simple daily behavior changes. Finally, the workshop will motivate patients by clarifying the links between quality sleep and their goals of heart-healthy living.   Recognizing and Reducing Stress Clinical staff led group instruction and group discussion with PowerPoint presentation and patient guidebook. To enhance the learning environment the use of posters, models and videos may be added. At the conclusion of this workshop, patients will be able to understand the types of stress reactions, differentiate between acute and chronic stress, and recognize the impact that chronic stress has on their health. They will also be able to apply different coping mechanisms, such as reframing negative  self-talk. Patients will have the opportunity to practice a variety of stress management techniques, such as deep abdominal  breathing, progressive muscle relaxation, and/or guided imagery.  The purpose of this lesson is to educate patients on the role of stress in their lives and to provide healthy techniques for coping with it.  Learning Barriers/Preferences:  Learning Barriers/Preferences - 09/03/22 1451       Learning Barriers/Preferences   Learning Barriers Sight   pt wears glasses   Learning Preferences Audio;Computer/Internet;Group Instruction;Individual Instruction;Pictoral;Skilled Demonstration;Verbal Instruction;Video;Written Material             Education Topics:  Knowledge Questionnaire Score:  Knowledge Questionnaire Score - 09/03/22 1452       Knowledge Questionnaire Score   Pre Score 16/24             Core Components/Risk Factors/Patient Goals at Admission:  Personal Goals and Risk Factors at Admission - 09/03/22 1454       Core Components/Risk Factors/Patient Goals on Admission    Weight Management Yes;Obesity;Weight Loss    Intervention Weight Management: Develop a combined nutrition and exercise program designed to reach desired caloric intake, while maintaining appropriate intake of nutrient and fiber, sodium and fats, and appropriate energy expenditure required for the weight goal.;Weight Management: Provide education and appropriate resources to help participant work on and attain dietary goals.;Weight Management/Obesity: Establish reasonable short term and long term weight goals.;Obesity: Provide education and appropriate resources to help participant work on and attain dietary goals.    Admit Weight 236 lb 5.3 oz (107.2 kg)    Expected Outcomes Short Term: Continue to assess and modify interventions until short term weight is achieved;Long Term: Adherence to nutrition and physical activity/exercise program aimed toward attainment of established weight goal;Weight Loss: Understanding of general recommendations for a balanced deficit meal plan, which promotes 1-2 lb weight loss  per week and includes a negative energy balance of 500-1000 kcal/d;Understanding recommendations for meals to include 15-35% energy as protein, 25-35% energy from fat, 35-60% energy from carbohydrates, less than 200mg of dietary cholesterol, 20-35 gm of total fiber daily;Understanding of distribution of calorie intake throughout the day with the consumption of 4-5 meals/snacks    Diabetes Yes    Intervention Provide education about signs/symptoms and action to take for hypo/hyperglycemia.;Provide education about proper nutrition, including hydration, and aerobic/resistive exercise prescription along with prescribed medications to achieve blood glucose in normal ranges: Fasting glucose 65-99 mg/dL    Expected Outcomes Short Term: Participant verbalizes understanding of the signs/symptoms and immediate care of hyper/hypoglycemia, proper foot care and importance of medication, aerobic/resistive exercise and nutrition plan for blood glucose control.;Long Term: Attainment of HbA1C < 7%.    Hypertension Yes    Intervention Provide education on lifestyle modifcations including regular physical activity/exercise, weight management, moderate sodium restriction and increased consumption of fresh fruit, vegetables, and low fat dairy, alcohol moderation, and smoking cessation.;Monitor prescription use compliance.    Expected Outcomes Short Term: Continued assessment and intervention until BP is < 140/90mm HG in hypertensive participants. < 130/80mm HG in hypertensive participants with diabetes, heart failure or chronic kidney disease.;Long Term: Maintenance of blood pressure at goal levels.    Lipids Yes    Intervention Provide education and support for participant on nutrition & aerobic/resistive exercise along with prescribed medications to achieve LDL <70mg, HDL >40mg.    Expected Outcomes Short Term: Participant states understanding of desired cholesterol values and is compliant with medications prescribed.  Participant is following exercise prescription and nutrition guidelines.;Long Term:   Cholesterol controlled with medications as prescribed, with individualized exercise RX and with personalized nutrition plan. Value goals: LDL < 74m, HDL > 40 mg.    Stress Yes    Intervention Offer individual and/or small group education and counseling on adjustment to heart disease, stress management and health-related lifestyle change. Teach and support self-help strategies.;Refer participants experiencing significant psychosocial distress to appropriate mental health specialists for further evaluation and treatment. When possible, include family members and significant others in education/counseling sessions.    Expected Outcomes Short Term: Participant demonstrates changes in health-related behavior, relaxation and other stress management skills, ability to obtain effective social support, and compliance with psychotropic medications if prescribed.;Long Term: Emotional wellbeing is indicated by absence of clinically significant psychosocial distress or social isolation.    Personal Goal Other Yes    Personal Goal Short: increase amount of walking, flexibility Long term: Strength and stability    Intervention Will continue to monitor pt and progress workloads as tolerated without sign or symptom    Expected Outcomes Pt will achieve her goals             Core Components/Risk Factors/Patient Goals Review:   Goals and Risk Factor Review     Row Name 09/07/22 1358             Core Components/Risk Factors/Patient Goals Review   Personal Goals Review Weight Management/Obesity;Stress;Hypertension;Lipids;Diabetes       Review KNada Boozerstarted intensive cardiac rehab on 09/07/22. Ryan Duke did well with exercise for his fitness level. Used a rollator. Vital signs and CBG's were stable       Expected Outcomes KNada Boozerwill continue to participate in intensvie cardiac rehab for exercise, nutrition and lifestyle modifications.                 Core Components/Risk Factors/Patient Goals at Discharge (Final Review):   Goals and Risk Factor Review - 09/07/22 1358       Core Components/Risk Factors/Patient Goals Review   Personal Goals Review Weight Management/Obesity;Stress;Hypertension;Lipids;Diabetes    Review KNada Boozerstarted intensive cardiac rehab on 09/07/22. Ryan Duke did well with exercise for his fitness level. Used a rollator. Vital signs and CBG's were stable    Expected Outcomes KNada Boozerwill continue to participate in intensvie cardiac rehab for exercise, nutrition and lifestyle modifications.             ITP Comments:  ITP Comments     Row Name 09/03/22 1336 09/07/22 1350         ITP Comments Dr TFransico HimMD, Medical Director, Introduction to [facility] Education Program/Intensive Cardiac rehab. Initial Orientation Packet Reviewed with the patient 30 Day ITP Review. KNada Boozerstarted cardiac rehab on 09/07/22 and did well with exercise for his fitness level as the patient is deconditioned.               Comments: See ITP comments.MHarrell GaveRN BSN

## 2022-09-07 NOTE — Telephone Encounter (Signed)
Ryan Duke from Cardiac Rehab called to ask if we could write an RX for a Rolator walker for the pt. Pt uses a Rolator at rehab and it helps him more than just the cane.   [name] also wanted to inform us that the pt's fingers are blanched and discolored so that his PCP could check his hands during pt's next OV (09-15-22).   Please call [name] with any questionsVerdis Frederickson: [phone]

## 2022-09-07 NOTE — Telephone Encounter (Signed)
DME order entered.  [facility] team notified via Epic community message.

## 2022-09-08 DIAGNOSIS — M5440 Lumbago with sciatica, unspecified side: Secondary | ICD-10-CM | POA: Diagnosis not present

## 2022-09-08 DIAGNOSIS — I350 Nonrheumatic aortic (valve) stenosis: Secondary | ICD-10-CM | POA: Diagnosis not present

## 2022-09-08 DIAGNOSIS — I359 Nonrheumatic aortic valve disorder, unspecified: Secondary | ICD-10-CM | POA: Diagnosis not present

## 2022-09-09 ENCOUNTER — Ambulatory Visit (HOSPITAL_COMMUNITY): Payer: Medicare Other

## 2022-09-09 ENCOUNTER — Encounter (HOSPITAL_COMMUNITY)
Admission: RE | Admit: 2022-09-09 | Discharge: 2022-09-09 | Disposition: A | Discharge status: Home / Self Care | Payer: Medicare Other | Source: Ambulatory Visit | Attending: Cardiovascular Disease | Admitting: Cardiovascular Disease

## 2022-09-09 DIAGNOSIS — N1831 Chronic kidney disease, stage 3a: Secondary | ICD-10-CM | POA: Diagnosis not present

## 2022-09-09 DIAGNOSIS — E785 Hyperlipidemia, unspecified: Secondary | ICD-10-CM | POA: Diagnosis not present

## 2022-09-09 DIAGNOSIS — Z952 Presence of prosthetic heart valve: Secondary | ICD-10-CM

## 2022-09-09 DIAGNOSIS — Z48812 Encounter for surgical aftercare following surgery on the circulatory system: Secondary | ICD-10-CM | POA: Diagnosis not present

## 2022-09-09 DIAGNOSIS — E1122 Type 2 diabetes mellitus with diabetic chronic kidney disease: Secondary | ICD-10-CM | POA: Diagnosis not present

## 2022-09-09 DIAGNOSIS — Z794 Long term (current) use of insulin: Secondary | ICD-10-CM | POA: Diagnosis not present

## 2022-09-09 LAB — GLUCOSE, CAPILLARY
Glucose-Capillary: 107 mg/dL — ABNORMAL HIGH (ref 70–99)
Glucose-Capillary: 102 mg/dL — ABNORMAL HIGH (ref 70–99)

## 2022-09-11 ENCOUNTER — Ambulatory Visit (HOSPITAL_COMMUNITY): Payer: Medicare Other

## 2022-09-11 ENCOUNTER — Encounter (HOSPITAL_COMMUNITY)
Admission: RE | Admit: 2022-09-11 | Discharge: 2022-09-11 | Disposition: A | Discharge status: Home / Self Care | Payer: Medicare Other | Source: Ambulatory Visit | Attending: Cardiovascular Disease | Admitting: Cardiovascular Disease

## 2022-09-11 DIAGNOSIS — Z48812 Encounter for surgical aftercare following surgery on the circulatory system: Secondary | ICD-10-CM | POA: Diagnosis not present

## 2022-09-11 DIAGNOSIS — E785 Hyperlipidemia, unspecified: Secondary | ICD-10-CM | POA: Diagnosis not present

## 2022-09-11 DIAGNOSIS — Z952 Presence of prosthetic heart valve: Secondary | ICD-10-CM | POA: Diagnosis not present

## 2022-09-11 DIAGNOSIS — N1831 Chronic kidney disease, stage 3a: Secondary | ICD-10-CM | POA: Diagnosis not present

## 2022-09-11 DIAGNOSIS — E1122 Type 2 diabetes mellitus with diabetic chronic kidney disease: Secondary | ICD-10-CM | POA: Diagnosis not present

## 2022-09-11 DIAGNOSIS — Z794 Long term (current) use of insulin: Secondary | ICD-10-CM | POA: Diagnosis not present

## 2022-09-14 ENCOUNTER — Encounter (HOSPITAL_COMMUNITY): Payer: Medicare Other

## 2022-09-14 ENCOUNTER — Ambulatory Visit: Payer: Medicare Other | Admitting: Internal Medicine

## 2022-09-14 ENCOUNTER — Ambulatory Visit (HOSPITAL_COMMUNITY): Payer: Medicare Other

## 2022-09-15 ENCOUNTER — Ambulatory Visit (INDEPENDENT_AMBULATORY_CARE_PROVIDER_SITE_OTHER): Payer: Medicare Other | Admitting: Internal Medicine

## 2022-09-15 ENCOUNTER — Encounter: Payer: Self-pay | Admitting: Internal Medicine

## 2022-09-15 VITALS — BP 142/68 | Temp 97.7°F | Resp 16 | Ht 67.75 in | Wt 233.0 lb

## 2022-09-15 DIAGNOSIS — E119 Type 2 diabetes mellitus without complications: Secondary | ICD-10-CM | POA: Insufficient documentation

## 2022-09-15 DIAGNOSIS — E118 Type 2 diabetes mellitus with unspecified complications: Secondary | ICD-10-CM

## 2022-09-15 DIAGNOSIS — S91209A Unspecified open wound of unspecified toe(s) with damage to nail, initial encounter: Secondary | ICD-10-CM | POA: Diagnosis not present

## 2022-09-15 DIAGNOSIS — I10 Essential (primary) hypertension: Secondary | ICD-10-CM

## 2022-09-15 DIAGNOSIS — Z23 Encounter for immunization: Secondary | ICD-10-CM

## 2022-09-15 DIAGNOSIS — Z0001 Encounter for general adult medical examination with abnormal findings: Secondary | ICD-10-CM | POA: Diagnosis not present

## 2022-09-15 DIAGNOSIS — Z794 Long term (current) use of insulin: Secondary | ICD-10-CM

## 2022-09-15 MED ORDER — SOLIQUA 100-33 UNT-MCG/ML ~~LOC~~ SOPN: 50.0000 [IU] | PEN_INJECTOR | Freq: Every day | SUBCUTANEOUS | 1 refills | Status: DC

## 2022-09-15 MED ORDER — GVOKE HYPOPEN 2-PACK 1 MG/0.2ML ~~LOC~~ SOAJ: 1.0000 | Freq: Every day | SUBCUTANEOUS | 5 refills | Status: DC | PRN

## 2022-09-15 NOTE — Patient Instructions (Signed)
Health Maintenance, Male Adopting a healthy lifestyle and getting preventive care are important in promoting health and wellness. Ask your health care provider about: The right schedule for you to have regular tests and exams. Things you can do on your own to prevent diseases and keep yourself healthy. What should I know about diet, weight, and exercise? Eat a healthy diet  Eat a diet that includes plenty of vegetables, fruits, low-fat dairy products, and lean protein. Do not eat a lot of foods that are high in solid fats, added sugars, or sodium. Maintain a healthy weight Body mass index (BMI) is a measurement that can be used to identify possible weight problems. It estimates body fat based on height and weight. Your health care provider can help determine your BMI and help you achieve or maintain a healthy weight. Get regular exercise Get regular exercise. This is one of the most important things you can do for your health. Most adults should: Exercise for at least 150 minutes each week. The exercise should increase your heart rate and make you sweat (moderate-intensity exercise). Do strengthening exercises at least twice a week. This is in addition to the moderate-intensity exercise. Spend less time sitting. Even light physical activity can be beneficial. Watch cholesterol and blood lipids Have your blood tested for lipids and cholesterol at 83 years of age, then have this test every 5 years. You may need to have your cholesterol levels checked more often if: Your lipid or cholesterol levels are high. You are older than 83 years of age. You are at high risk for heart disease. What should I know about cancer screening? Many types of cancers can be detected early and may often be prevented. Depending on your health history and family history, you may need to have cancer screening at various ages. This may include screening for: Colorectal cancer. Prostate cancer. Skin cancer. Lung  cancer. What should I know about heart disease, diabetes, and high blood pressure? Blood pressure and heart disease High blood pressure causes heart disease and increases the risk of stroke. This is more likely to develop in people who have high blood pressure readings or are overweight. Talk with your health care provider about your target blood pressure readings. Have your blood pressure checked: Every 3-5 years if you are 18-39 years of age. Every year if you are 40 years old or older. If you are between the ages of 65 and 75 and are a current or former smoker, ask your health care provider if you should have a one-time screening for abdominal aortic aneurysm (AAA). Diabetes Have regular diabetes screenings. This checks your fasting blood sugar level. Have the screening done: Once every three years after age 45 if you are at a normal weight and have a low risk for diabetes. More often and at a younger age if you are overweight or have a high risk for diabetes. What should I know about preventing infection? Hepatitis B If you have a higher risk for hepatitis B, you should be screened for this virus. Talk with your health care provider to find out if you are at risk for hepatitis B infection. Hepatitis C Blood testing is recommended for: Everyone born from 1945 through 1965. Anyone with known risk factors for hepatitis C. Sexually transmitted infections (STIs) You should be screened each year for STIs, including gonorrhea and chlamydia, if: You are sexually active and are younger than 83 years of age. You are older than 83 years of age and your   health care provider tells you that you are at risk for this type of infection. Your sexual activity has changed since you were last screened, and you are at increased risk for chlamydia or gonorrhea. Ask your health care provider if you are at risk. Ask your health care provider about whether you are at high risk for HIV. Your health care provider  may recommend a prescription medicine to help prevent HIV infection. If you choose to take medicine to prevent HIV, you should first get tested for HIV. You should then be tested every 3 months for as long as you are taking the medicine. Follow these instructions at home: Alcohol use Do not drink alcohol if your health care provider tells you not to drink. If you drink alcohol: Limit how much you have to 0-2 drinks a day. Know how much alcohol is in your drink. In the [place] one drink equals one 12 oz bottle of beer (355 mL), one 5 oz glass of wine (148 mL), or one 1 oz glass of hard liquor (44 mL). Lifestyle Do not use any products that contain nicotine or tobacco. These products include cigarettes, chewing tobacco, and vaping devices, such as e-cigarettes. If you need help quitting, ask your health care provider. Do not use street drugs. Do not share needles. Ask your health care provider for help if you need support or information about quitting drugs. General instructions Schedule regular health, dental, and eye exams. Stay current with your vaccines. Tell your health care provider if: You often feel depressed. You have ever been abused or do not feel safe at home. Summary Adopting a healthy lifestyle and getting preventive care are important in promoting health and wellness. Follow your health care provider's instructions about healthy diet, exercising, and getting tested or screened for diseases. Follow your health care provider's instructions on monitoring your cholesterol and blood pressure. This information is not intended to replace advice given to you by your health care provider. Make sure you discuss any questions you have with your health care provider. Document Revised: 04/07/2021 Document Reviewed: 04/07/2021 Elsevier Patient Education  2023 Elsevier Inc.  

## 2022-09-15 NOTE — Progress Notes (Unsigned)
Subjective:  Patient ID: Ryan Duke, male    DOB: 1941-06-24  Age: 81 y.o. MRN: [id number]  CC: Annual Exam, Hypertension, and Diabetes   HPI Juvenal Umar Jansma presents for f/up and CPX.  Hi Dr Ronnald Ramp,   [name] recently started cardiac rehab this week. On both Wednesday and Friday, his blood sugars did drop significantly during and after exercise. He consumed breakfast (greek yogurt + fruit) and did not take his insulin prior to exercise on these days.   On Wednesday, 10/11, his blood sugar during exercise dropped to the 100s trending downward.  Today, 10/13, his pre-work out blood sugar was 201. When checked after exercise his blood sugar dropped to 130 and continues to trend downward. We did preventatively treat with crackers + peanut butter.  He does continue to take insulin after exercise, prior to his lunch on days he attends cardiac rehab.   Just wanted to update you on this and determine if any medication changes were indicated.   Thanks!   Sam   Hey Dr. Ronnald Ramp!!   Anyway, I hope you are doing well. I wanted to reach out about a mutual patient. 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 Dr. 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 It doesn't seem suspicious based on the report and radiologist said you could "consider an MRI." I hate to order unnecessary scans but don't want to miss anything either. He has an apt coming up with you in October. I wrote to review pancreas lesion in the apt notes to remind you. Report below.   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 No overtly suspicious CT features. MRI abdomen  without and with IV contrast may be considered for further  evaluation.   Thank you for your care of my family and all your other patients!    Nell Range  Outpatient Medications Prior to Visit  Medication Sig Dispense Refill   amoxicillin (AMOXIL) 500 MG tablet Take 4 tablets (2,000 mg total) by mouth as directed. 1 hour prior to dental work including cleanings 12 tablet 12   aspirin 81 MG chewable tablet Chew 1 tablet (81 mg total) by mouth daily. 90 tablet 3   atenolol-chlorthalidone (TENORETIC) 100-25 MG tablet TAKE ONE TABLET BY MOUTH ONCE DAILY 90 tablet 0   clopidogrel (PLAVIX) 75 MG tablet Take 1 tablet (75 mg total) by mouth daily with breakfast. 90 tablet 1   Continuous Blood Gluc Receiver (FREESTYLE LIBRE 2 READER) DEVI USE AS DIRECTED. 2 each 5   Continuous Blood Gluc Sensor (FREESTYLE LIBRE 2 SENSOR) MISC USE AS DIRECTED AND REPLACE EVERY 14 DAYS. 2 each 5   DULoxetine (CYMBALTA) 60 MG capsule TAKE ONE CAPSULE DAILY 90 capsule 1   fluticasone (FLONASE) 50 MCG/ACT nasal spray Place 1 spray into both nostrils daily as needed for allergies or rhinitis.     Insulin Pen Needle (NOVOFINE) 32G X 6 MM MISC 1 Act by Does not apply route daily. 100 each 1   JARDIANCE 25 MG TABS tablet TAKE ONE TABLET ONCE DAILY BEFORE BREAKFAST. 90 tablet 1   KERENDIA 20 MG TABS Take 1 tablet by mouth daily. 90 tablet 0   losartan (COZAAR) 100 MG tablet Take 1 tablet (100 mg total)  by mouth daily. for high blood pressure 90 tablet 1   Multiple Vitamin (MULTIVITAMIN WITH MINERALS) TABS tablet Take 1 tablet by mouth daily.     Multiple Vitamins-Minerals (PRESERVISION AREDS PO) Take 1 tablet by mouth in the morning and at bedtime.     MYRBETRIQ 50 MG TB24 tablet TAKE 1 TABLET ONCE DAILY. 90 tablet 1   pravastatin (PRAVACHOL) 40 MG tablet TAKE ONE TABLET BY MOUTH ONCE DAILY 90 tablet 0   pregabalin (LYRICA) 150 MG capsule Take 150 mg by mouth See admin instructions. Take 150 mg daily, may take an additional 150 mg twice daily as needed for pain     tamsulosin (FLOMAX) 0.4 MG CAPS capsule TAKE ONE CAPSULE BY MOUTH DAILY 90 capsule 1   vitamin B-12  (CYANOCOBALAMIN) 500 MCG tablet Take 500 mcg by mouth daily.     Insulin Glargine-Lixisenatide (SOLIQUA) 100-33 UNT-MCG/ML SOPN Inject 60 Units into the skin daily. 54 mL 1   No facility-administered medications prior to visit.    ROS Review of Systems  Objective:  BP (!) 142/68 (BP Location: Left Arm, Patient Position: Sitting, Cuff Size: Large)   Temp 97.7 F (36.5 C) (Oral)   Resp 16   Ht 5' 7.75" (1.721 m)   Wt 233 lb (105.7 kg)   SpO2 90%   BMI 35.69 kg/m   BP Readings from Last 3 Encounters:  09/15/22 (!) 142/68  09/03/22 118/78  08/19/22 122/72    Wt Readings from Last 3 Encounters:  09/15/22 233 lb (105.7 kg)  09/03/22 236 lb 5.3 oz (107.2 kg)  08/19/22 229 lb (103.9 kg)    Physical Exam  Lab Results  Component Value Date   WBC 11.7 (H) 07/15/2022   HGB 13.4 07/15/2022   HCT 41.7 07/15/2022   PLT 177 07/15/2022   GLUCOSE 103 (H) 07/24/2022   CHOL 152 12/04/2021   TRIG 96.0 12/04/2021   HDL 40.20 12/04/2021   LDLDIRECT 81.0 05/26/2018   LDLCALC 93 12/04/2021   ALT 16 07/10/2022   AST 18 07/10/2022   NA 142 07/24/2022   K 4.1 07/24/2022   CL 102 07/24/2022   CREATININE 1.55 (H) 07/24/2022   BUN 36 (H) 07/24/2022   CO2 25 07/24/2022   TSH 2.96 12/04/2021   PSA 1.05 10/28/2015   INR 1.0 07/10/2022   HGBA1C 7.2 (H) 07/10/2022   MICROALBUR 20.2 (H) 12/04/2021    No results found.  Assessment & Plan:   Connar was seen today for annual exam, hypertension and diabetes.  Diagnoses and all orders for this visit:  Flu vaccine need -     Flu Vaccine QUAD High Dose(Fluad)  Type II diabetes mellitus with manifestations (HCC) -     HM Diabetes Foot Exam -     Insulin Glargine-Lixisenatide (SOLIQUA) 100-33 UNT-MCG/ML SOPN; Inject 50 Units into the skin daily.  Essential hypertension  Insulin-requiring or dependent type II diabetes mellitus (HCC) -     Insulin Glargine-Lixisenatide (SOLIQUA) 100-33 UNT-MCG/ML SOPN; Inject 50 Units into the skin  daily. -     Glucagon (GVOKE HYPOPEN 2-PACK) 1 MG/0.2ML SOAJ; Inject 1 Act into the skin daily as needed.  Encounter for general adult medical examination with abnormal findings  Avulsion of toenail, initial encounter -     Ambulatory referral to Podiatry   I have changed [name]. Meske's Soliqua. I am also having him start on Gvoke HypoPen 2-Pack. Additionally, I am having him maintain his multivitamin with minerals, Multiple Vitamins-Minerals (PRESERVISION AREDS PO),  cyanocobalamin, pregabalin, NovoFine, Myrbetriq, FreeStyle Libre 2 Sensor, losartan, Jardiance, DULoxetine, tamsulosin, fluticasone, aspirin, clopidogrel, amoxicillin, FreeStyle Libre 2 Reader, [place] Clamp, pravastatin, and atenolol-chlorthalidone.  Meds ordered this encounter  Medications   Insulin Glargine-Lixisenatide (SOLIQUA) 100-33 UNT-MCG/ML SOPN    Sig: Inject 50 Units into the skin daily.    Dispense:  54 mL    Refill:  1   Glucagon (GVOKE HYPOPEN 2-PACK) 1 MG/0.2ML SOAJ    Sig: Inject 1 Act into the skin daily as needed.    Dispense:  2 mL    Refill:  5     Follow-up: Return in about 3 months (around 12/16/2022).  [name], MD

## 2022-09-16 ENCOUNTER — Ambulatory Visit (HOSPITAL_COMMUNITY): Payer: Medicare Other

## 2022-09-16 ENCOUNTER — Encounter (HOSPITAL_COMMUNITY)
Admission: RE | Admit: 2022-09-16 | Discharge: 2022-09-16 | Disposition: A | Discharge status: Home / Self Care | Payer: Medicare Other | Source: Ambulatory Visit | Attending: Cardiovascular Disease | Admitting: Cardiovascular Disease

## 2022-09-16 DIAGNOSIS — N1831 Chronic kidney disease, stage 3a: Secondary | ICD-10-CM | POA: Diagnosis not present

## 2022-09-16 DIAGNOSIS — Z48812 Encounter for surgical aftercare following surgery on the circulatory system: Secondary | ICD-10-CM | POA: Diagnosis not present

## 2022-09-16 DIAGNOSIS — Z952 Presence of prosthetic heart valve: Secondary | ICD-10-CM | POA: Diagnosis not present

## 2022-09-16 DIAGNOSIS — E1122 Type 2 diabetes mellitus with diabetic chronic kidney disease: Secondary | ICD-10-CM | POA: Diagnosis not present

## 2022-09-16 DIAGNOSIS — Z794 Long term (current) use of insulin: Secondary | ICD-10-CM | POA: Diagnosis not present

## 2022-09-16 DIAGNOSIS — E785 Hyperlipidemia, unspecified: Secondary | ICD-10-CM | POA: Diagnosis not present

## 2022-09-18 ENCOUNTER — Ambulatory Visit (HOSPITAL_COMMUNITY): Payer: Medicare Other

## 2022-09-18 ENCOUNTER — Encounter (HOSPITAL_COMMUNITY)
Admission: RE | Admit: 2022-09-18 | Discharge: 2022-09-18 | Disposition: A | Discharge status: Home / Self Care | Payer: Medicare Other | Source: Ambulatory Visit | Attending: Cardiovascular Disease | Admitting: Cardiovascular Disease

## 2022-09-18 DIAGNOSIS — Z952 Presence of prosthetic heart valve: Secondary | ICD-10-CM

## 2022-09-18 DIAGNOSIS — E1122 Type 2 diabetes mellitus with diabetic chronic kidney disease: Secondary | ICD-10-CM | POA: Diagnosis not present

## 2022-09-18 DIAGNOSIS — E785 Hyperlipidemia, unspecified: Secondary | ICD-10-CM | POA: Diagnosis not present

## 2022-09-18 DIAGNOSIS — Z48812 Encounter for surgical aftercare following surgery on the circulatory system: Secondary | ICD-10-CM | POA: Diagnosis not present

## 2022-09-18 DIAGNOSIS — N1831 Chronic kidney disease, stage 3a: Secondary | ICD-10-CM | POA: Diagnosis not present

## 2022-09-18 DIAGNOSIS — Z794 Long term (current) use of insulin: Secondary | ICD-10-CM | POA: Diagnosis not present

## 2022-09-21 ENCOUNTER — Ambulatory Visit (HOSPITAL_COMMUNITY): Payer: Medicare Other

## 2022-09-21 ENCOUNTER — Encounter (HOSPITAL_COMMUNITY)
Admission: RE | Admit: 2022-09-21 | Discharge: 2022-09-21 | Disposition: A | Discharge status: Home / Self Care | Payer: Medicare Other | Source: Ambulatory Visit | Attending: Cardiovascular Disease | Admitting: Cardiovascular Disease

## 2022-09-21 DIAGNOSIS — N1831 Chronic kidney disease, stage 3a: Secondary | ICD-10-CM | POA: Diagnosis not present

## 2022-09-21 DIAGNOSIS — Z794 Long term (current) use of insulin: Secondary | ICD-10-CM | POA: Diagnosis not present

## 2022-09-21 DIAGNOSIS — Z952 Presence of prosthetic heart valve: Secondary | ICD-10-CM | POA: Diagnosis not present

## 2022-09-21 DIAGNOSIS — E1122 Type 2 diabetes mellitus with diabetic chronic kidney disease: Secondary | ICD-10-CM | POA: Diagnosis not present

## 2022-09-21 DIAGNOSIS — Z48812 Encounter for surgical aftercare following surgery on the circulatory system: Secondary | ICD-10-CM | POA: Diagnosis not present

## 2022-09-21 DIAGNOSIS — E785 Hyperlipidemia, unspecified: Secondary | ICD-10-CM | POA: Diagnosis not present

## 2022-09-21 NOTE — Progress Notes (Signed)
Reviewed home exercise guidelines with patient including endpoints, temperature precautions, target heart rate and rate of perceived exertion. Patient plans to  walk as his mode of home exercise. Discussed accumulating 30 minutes, 1-2 days/week, and patient is agreeable to this. Patient voices understanding of instructions given.  Sol Passer, MS, ACSM CEP

## 2022-09-22 ENCOUNTER — Other Ambulatory Visit: Payer: Self-pay | Admitting: Internal Medicine

## 2022-09-22 DIAGNOSIS — E119 Type 2 diabetes mellitus without complications: Secondary | ICD-10-CM

## 2022-09-23 ENCOUNTER — Encounter (HOSPITAL_COMMUNITY)
Admission: RE | Admit: 2022-09-23 | Discharge: 2022-09-23 | Disposition: A | Discharge status: Home / Self Care | Payer: Medicare Other | Source: Ambulatory Visit | Attending: Cardiovascular Disease | Admitting: Cardiovascular Disease

## 2022-09-23 ENCOUNTER — Ambulatory Visit (HOSPITAL_COMMUNITY): Payer: Medicare Other

## 2022-09-23 DIAGNOSIS — E785 Hyperlipidemia, unspecified: Secondary | ICD-10-CM | POA: Diagnosis not present

## 2022-09-23 DIAGNOSIS — Z952 Presence of prosthetic heart valve: Secondary | ICD-10-CM | POA: Diagnosis not present

## 2022-09-23 DIAGNOSIS — Z794 Long term (current) use of insulin: Secondary | ICD-10-CM | POA: Diagnosis not present

## 2022-09-23 DIAGNOSIS — E1122 Type 2 diabetes mellitus with diabetic chronic kidney disease: Secondary | ICD-10-CM | POA: Diagnosis not present

## 2022-09-23 DIAGNOSIS — Z48812 Encounter for surgical aftercare following surgery on the circulatory system: Secondary | ICD-10-CM | POA: Diagnosis not present

## 2022-09-23 DIAGNOSIS — N1831 Chronic kidney disease, stage 3a: Secondary | ICD-10-CM | POA: Diagnosis not present

## 2022-09-23 LAB — GLUCOSE, CAPILLARY
Glucose-Capillary: 168 mg/dL — ABNORMAL HIGH (ref 70–99)
Glucose-Capillary: 120 mg/dL — ABNORMAL HIGH (ref 70–99)

## 2022-09-25 ENCOUNTER — Other Ambulatory Visit: Payer: Self-pay | Admitting: Internal Medicine

## 2022-09-25 ENCOUNTER — Ambulatory Visit (HOSPITAL_COMMUNITY): Payer: Medicare Other

## 2022-09-25 ENCOUNTER — Encounter (HOSPITAL_COMMUNITY): Payer: Medicare Other

## 2022-09-25 DIAGNOSIS — I10 Essential (primary) hypertension: Secondary | ICD-10-CM

## 2022-09-28 ENCOUNTER — Encounter (HOSPITAL_COMMUNITY): Payer: Medicare Other

## 2022-09-28 ENCOUNTER — Ambulatory Visit (HOSPITAL_COMMUNITY): Payer: Medicare Other

## 2022-09-30 ENCOUNTER — Encounter (HOSPITAL_COMMUNITY)
Admission: RE | Admit: 2022-09-30 | Discharge: 2022-09-30 | Disposition: A | Discharge status: Home / Self Care | Payer: Medicare Other | Source: Ambulatory Visit | Attending: Cardiovascular Disease | Admitting: Cardiovascular Disease

## 2022-09-30 ENCOUNTER — Ambulatory Visit (HOSPITAL_COMMUNITY): Payer: Medicare Other

## 2022-09-30 DIAGNOSIS — Z952 Presence of prosthetic heart valve: Secondary | ICD-10-CM | POA: Insufficient documentation

## 2022-10-02 ENCOUNTER — Encounter (HOSPITAL_COMMUNITY)
Admission: RE | Admit: 2022-10-02 | Discharge: 2022-10-02 | Disposition: A | Discharge status: Home / Self Care | Payer: Medicare Other | Source: Ambulatory Visit | Attending: Cardiovascular Disease | Admitting: Cardiovascular Disease

## 2022-10-02 ENCOUNTER — Ambulatory Visit (HOSPITAL_COMMUNITY): Payer: Medicare Other

## 2022-10-02 DIAGNOSIS — Z952 Presence of prosthetic heart valve: Secondary | ICD-10-CM

## 2022-10-05 ENCOUNTER — Encounter (HOSPITAL_COMMUNITY)
Admission: RE | Admit: 2022-10-05 | Discharge: 2022-10-05 | Disposition: A | Discharge status: Home / Self Care | Payer: Medicare Other | Source: Ambulatory Visit | Attending: Cardiovascular Disease | Admitting: Cardiovascular Disease

## 2022-10-05 ENCOUNTER — Ambulatory Visit (HOSPITAL_COMMUNITY): Payer: Medicare Other

## 2022-10-05 DIAGNOSIS — Z952 Presence of prosthetic heart valve: Secondary | ICD-10-CM

## 2022-10-06 NOTE — Progress Notes (Signed)
Cardiac Individual Treatment Plan  Patient Details  Name: Ryan Duke MRN: [id number] Date of Birth: January 05, 1941 Referring Provider:   Flowsheet Row INTENSIVE CARDIAC REHAB ORIENT from 09/03/2022 in [place]  Referring Provider Dr. Sherren Mocha MD       Initial Encounter Date:  [facility] from 09/03/2022 in [facility]  Date 09/03/22       Visit Diagnosis: 07/14/22 S/P TAVR Sublclavian approach  Patient's Home Medications on Admission:  Current Outpatient Medications:    amoxicillin (AMOXIL) 500 MG tablet, Take 4 tablets (2,000 mg total) by mouth as directed. 1 hour prior to dental work including cleanings, Disp: 12 tablet, Rfl: 12   aspirin 81 MG chewable tablet, Chew 1 tablet (81 mg total) by mouth daily., Disp: 90 tablet, Rfl: 3   atenolol-chlorthalidone (TENORETIC) 100-25 MG tablet, TAKE ONE TABLET BY MOUTH ONCE DAILY, Disp: 90 tablet, Rfl: 0   clopidogrel (PLAVIX) 75 MG tablet, Take 1 tablet (75 mg total) by mouth daily with breakfast., Disp: 90 tablet, Rfl: 1   Continuous Blood Gluc Receiver (FREESTYLE LIBRE 2 READER) DEVI, USE AS DIRECTED., Disp: 2 each, Rfl: 5   Continuous Blood Gluc Sensor (FREESTYLE LIBRE 2 SENSOR) MISC, USE AS DIRECTED AND REPLACE EVERY 14 DAYS., Disp: 2 each, Rfl: 5   DULoxetine (CYMBALTA) 60 MG capsule, TAKE ONE CAPSULE DAILY, Disp: 90 capsule, Rfl: 1   fluticasone (FLONASE) 50 MCG/ACT nasal spray, Place 1 spray into both nostrils daily as needed for allergies or rhinitis., Disp: , Rfl:    Glucagon (GVOKE HYPOPEN 2-PACK) 1 MG/0.2ML SOAJ, Inject 1 Act into the skin daily as needed., Disp: 2 mL, Rfl: 5   Insulin Glargine-Lixisenatide (SOLIQUA) 100-33 UNT-MCG/ML SOPN, Inject 50 Units into the skin daily., Disp: 54 mL, Rfl: 1   Insulin Pen Needle (NOVOFINE) 32G X 6 MM MISC, 1 Act by Does not apply route daily., Disp: 100 each, Rfl: 1   JARDIANCE 25 MG TABS  tablet, TAKE ONE TABLET ONCE DAILY BEFORE BREAKFAST., Disp: 90 tablet, Rfl: 1   KERENDIA 20 MG TABS, Take 1 tablet by mouth daily., Disp: 90 tablet, Rfl: 0   losartan (COZAAR) 100 MG tablet, Take 1 tablet (100 mg total) by mouth daily for high blood pressure, Disp: 90 tablet, Rfl: 1   Multiple Vitamin (MULTIVITAMIN WITH MINERALS) TABS tablet, Take 1 tablet by mouth daily., Disp: , Rfl:    Multiple Vitamins-Minerals (PRESERVISION AREDS PO), Take 1 tablet by mouth in the morning and at bedtime., Disp: , Rfl:    MYRBETRIQ 50 MG TB24 tablet, TAKE 1 TABLET ONCE DAILY., Disp: 90 tablet, Rfl: 1   pravastatin (PRAVACHOL) 40 MG tablet, TAKE ONE TABLET BY MOUTH ONCE DAILY, Disp: 90 tablet, Rfl: 0   pregabalin (LYRICA) 150 MG capsule, Take 150 mg by mouth See admin instructions. 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 CBurt Knackand Dr. BCyndia Bent  Severe aortic stenosis     Tobacco Use: Social History   Tobacco Use  Smoking Status Former   Types: Cigarettes   Quit date: 11/30/1974   Years since quitting: 47.8   Passive exposure: Never  Smokeless Tobacco Never    Labs: Review Flowsheet  More data exists      Latest Ref Rng & Units 12/04/2021 03/10/2022 05/07/2022 07/10/2022 07/14/2022  Labs for ITP Cardiac and Pulmonary Rehab  Cholestrol 0 - 200 mg/dL 152  - - - -  LDL (calc) 0 - 99 mg/dL 93  - - - -  HDL-C >39.00 mg/dL 40.20  - - - -   Trlycerides 0.0 - 149.0 mg/dL 96.0  - - - -  Hemoglobin A1c 4.8 - 5.6 % 8.4  8.1  - 7.2  -  PH, Arterial 7.35 - 7.45 - - 7.311  - 7.372   PCO2 arterial 32 - 48 mmHg - - 51.5  - 46.2   Bicarbonate 20.0 - 28.0 mmol/L - - 26.3  26.0  - 26.8   TCO2 22 - 32 mmol/L - - 28  28  - _0 Acid-base deficit 0.0 - 2.0 mmol/L 0.0 - 2.0 mmol/L - - 1.0  1.0  - -  O2 Saturation % - - 78  98  - 100     Capillary Blood Glucose: Lab Results  Component Value Date   GLUCAP 120 (H) 09/23/2022   GLUCAP 168 (H) 09/23/2022   GLUCAP 102 (H) 09/09/2022   GLUCAP 107 (H) 09/09/2022   GLUCAP 140 (H) 09/07/2022     Exercise Target Goals: Exercise Program Goal: Individual exercise prescription set using results from initial 6 min walk test and THRR while considering  patient's activity barriers and safety.   Exercise Prescription Goal: Initial exercise prescription builds to 30-45 minutes a day of aerobic activity, 2-3 days per week.  Home exercise guidelines will be given to patient during program as part of exercise prescription that the participant will acknowledge.  Activity Barriers & Risk Stratification:  Activity Barriers & Cardiac Risk Stratification - 09/03/22 1441       Activity Barriers & Cardiac Risk Stratification   Activity Barriers Back Problems;Joint Problems;Balance Concerns;Deconditioning;History of Falls;Muscular Weakness;Assistive Device    Cardiac Risk Stratification High             6 Minute Walk:  6 Minute Walk     Row Name 09/03/22 1436         6 Minute Walk   Phase Initial  Nustep Stepper test: Assistive device, previous falls, instability, balance concerns     Distance 821 feet     Walk Time 6 minutes     # of Rest Breaks 0     MPH 1.55     METS 0.5     RPE 13     Perceived Dyspnea  0     VO2 Peak 1.75     Symptoms No     Resting HR 77 bpm     Resting BP 118/78     Resting Oxygen Saturation  97 %     Exercise Oxygen Saturation  during 6 min walk 95 %      Max Ex. HR 67 bpm     Max Ex. BP 124/76     2 Minute Post BP 128/68  Oxygen Initial Assessment:   Oxygen Re-Evaluation:   Oxygen Discharge (Final Oxygen Re-Evaluation):   Initial Exercise Prescription:  Initial Exercise Prescription - 09/07/22 1255       NuStep   SPM 53             Perform Capillary Blood Glucose checks as needed.  Exercise Prescription Changes:   Exercise Prescription Changes     Row Name 09/07/22 1255 09/16/22 1012 10/02/22 1021         Response to Exercise   Blood Pressure (Admit) 142/70 152/72 142/60     Blood Pressure (Exercise) 140/62 142/70 142/66     Blood Pressure (Exit) 122/50 128/82 128/58     Heart Rate (Admit) 61 bpm 48 bpm 60 bpm     Heart Rate (Exercise) 81 bpm 75 bpm 81 bpm     Heart Rate (Exit) 61 bpm 58 bpm 69 bpm     Rating of Perceived Exertion (Exercise) _0 Perceived Dyspnea (Exercise) 0 0 0     Symptoms 0 None Patient c/o feeling tired today, possibly due to not sleeping well last night.     Comments Pt first day in the CRP2 program -- Pt only did 17 minutes on the NuStep today due to fatigue.     Duration Progress to 30 minutes of  aerobic without signs/symptoms of physical distress Progress to 30 minutes of  aerobic without signs/symptoms of physical distress Progress to 30 minutes of  aerobic without signs/symptoms of physical distress     Intensity THRR unchanged THRR unchanged THRR unchanged       Progression   Progression Continue to progress workloads to maintain intensity without signs/symptoms of physical distress. Continue to progress workloads to maintain intensity without signs/symptoms of physical distress. Continue to progress workloads to maintain intensity without signs/symptoms of physical distress.     Average METs 1.3 1.7 1.7       Resistance Training   Training Prescription Yes No  Relaxation day, no weights. Yes     Weight 3 -- 3 lbs     Reps 10-15 -- 10-15     Time 10  Minutes -- 10 Minutes       Interval Training   Interval Training -- No No       NuStep   Level _1 SPM -- 72 70     Minutes _2 METs 1.3 1.7 1.7       Home Exercise Plan   Plans to continue exercise at -- -- Home (comment)  Walking     Frequency -- -- Add 1 additional day to program exercise sessions.     Initial Home Exercises Provided -- -- 09/21/22              Exercise Comments:   Exercise Comments     Row Name 09/07/22 1259 09/16/22 1047 09/21/22 1056 10/02/22 1057     Exercise Comments Pt first day in the CRP2 program. Pt tolerated low intensity exercise well with an average MET level of 1.3. Pt is learning his THRR, RPE and ExRx Reviewed METs with patient. Reviewed home exercise guidelines and goals with patient. Reviewed METs and goals with patient.             Exercise Goals and Review:   Exercise Goals     Row Name 09/03/22 1443  Exercise Goals   Increase Physical Activity Yes       Intervention Provide advice, education, support and counseling about physical activity/exercise needs.;Develop an individualized exercise prescription for aerobic and resistive training based on initial evaluation findings, risk stratification, comorbidities and participant's personal goals.       Expected Outcomes Short Term: Attend rehab on a regular basis to increase amount of physical activity.;Long Term: Add in home exercise to make exercise part of routine and to increase amount of physical activity.;Long Term: Exercising regularly at least 3-5 days a week.       Increase Strength and Stamina Yes       Intervention Provide advice, education, support and counseling about physical activity/exercise needs.;Develop an individualized exercise prescription for aerobic and resistive training based on initial evaluation findings, risk stratification, comorbidities and participant's personal goals.       Expected Outcomes Short Term: Increase workloads  from initial exercise prescription for resistance, speed, and METs.;Short Term: Perform resistance training exercises routinely during rehab and add in resistance training at home;Long Term: Improve cardiorespiratory fitness, muscular endurance and strength as measured by increased METs and functional capacity (6MWT)       Able to understand and use rate of perceived exertion (RPE) scale Yes       Intervention Provide education and explanation on how to use RPE scale       Expected Outcomes Short Term: Able to use RPE daily in rehab to express subjective intensity level;Long Term:  Able to use RPE to guide intensity level when exercising independently       Knowledge and understanding of Target Heart Rate Range (THRR) Yes       Intervention Provide education and explanation of THRR including how the numbers were predicted and where they are located for reference       Expected Outcomes Short Term: Able to state/look up THRR;Long Term: Able to use THRR to govern intensity when exercising independently;Short Term: Able to use daily as guideline for intensity in rehab       Understanding of Exercise Prescription Yes       Intervention Provide education, explanation, and written materials on patient's individual exercise prescription       Expected Outcomes Short Term: Able to explain program exercise prescription;Long Term: Able to explain home exercise prescription to exercise independently                Exercise Goals Re-Evaluation :  Exercise Goals Re-Evaluation     Row Name 09/07/22 1257 09/21/22 1056 10/02/22 1057         Exercise Goal Re-Evaluation   Exercise Goals Review Increase Physical Activity;Increase Strength and Stamina;Able to understand and use rate of perceived exertion (RPE) scale;Knowledge and understanding of Target Heart Rate Range (THRR);Understanding of Exercise Prescription Increase Physical Activity;Increase Strength and Stamina;Able to understand and use rate of  perceived exertion (RPE) scale;Knowledge and understanding of Target Heart Rate Range (THRR);Understanding of Exercise Prescription Increase Physical Activity;Increase Strength and Stamina;Able to understand and use rate of perceived exertion (RPE) scale;Knowledge and understanding of Target Heart Rate Range (THRR);Understanding of Exercise Prescription     Comments Pt first day in the CRP2 program. Pt tolerated low intensity exercise well with an average MET level of 1.3. Pt is learning his THRR, RPE and ExRx Reviewed exercise prescription with patient. Patient plans to walk as his mode of exercise. Discussed accumulating 30 minutes 1-2 days/week, and patient is amenable to this. Patient making gradual progress with exercise.  Patient doesn't feel he's where he needs to be with strength and stamina yet. Encouraged to increase pace and will increase level next week on the NuStep.     Expected Outcomes Will continue to monitor pt and progress workloads as tolerated without sign or symptom Patient will walk 30 minutes 1-2 days/week in addition to exercise at cardiac rehab as tolerated. Continue to progress workloads as tolerated.              Discharge Exercise Prescription (Final Exercise Prescription Changes):  Exercise Prescription Changes - 10/02/22 1021       Response to Exercise   Blood Pressure (Admit) 142/60    Blood Pressure (Exercise) 142/66    Blood Pressure (Exit) 128/58    Heart Rate (Admit) 60 bpm    Heart Rate (Exercise) 81 bpm    Heart Rate (Exit) 69 bpm    Rating of Perceived Exertion (Exercise) 13    Perceived Dyspnea (Exercise) 0    Symptoms Patient c/o feeling tired today, possibly due to not sleeping well last night.    Comments Pt only did 17 minutes on the NuStep today due to fatigue.    Duration Progress to 30 minutes of  aerobic without signs/symptoms of physical distress    Intensity THRR unchanged      Progression   Progression Continue to progress workloads to  maintain intensity without signs/symptoms of physical distress.    Average METs 1.7      Resistance Training   Training Prescription Yes    Weight 3 lbs    Reps 10-15    Time 10 Minutes      Interval Training   Interval Training No      NuStep   Level 1    SPM 70    Minutes 17    METs 1.7      Home Exercise Plan   Plans to continue exercise at Home (comment)   Walking   Frequency Add 1 additional day to program exercise sessions.    Initial Home Exercises Provided 09/21/22             Nutrition:  Target Goals: Understanding of nutrition guidelines, daily intake of sodium <1531m, cholesterol <2070m calories 30% from fat and 7% or less from saturated fats, daily to have 5 or more servings of fruits and vegetables.  Biometrics:  Pre Biometrics - 09/03/22 1444       Pre Biometrics   Waist Circumference 51 inches    Hip Circumference 45 inches    Waist to Hip Ratio 1.13 %    Triceps Skinfold 12 mm    % Body Fat 35.6 %    Grip Strength 31 kg    Flexibility --   Attempted but unable to reach.   Single Leg Stand --   Did not attempt, asssistive device, previous falls and stability issues             Nutrition Therapy Plan and Nutrition Goals:  Nutrition Therapy & Goals - 10/05/22 1316       Nutrition Therapy   Diet Heart Healthy/Carbohydrate Consistent diet    Drug/Food Interactions Statins/Certain Fruits      Personal Nutrition Goals   Nutrition Goal Patient to identify strategies for managin cardiovascular risk by attending the weekly [facility] education and nutrition series    Personal Goal #2 Patient to identify food sources and limit daily intake of saturated fat, trans fat, sodium, and refined carbohydrates    Personal Goal #3 Patient  to limit to <1520m of sodium daily    Comments KNada Boozerand his wife continue to attend the [facility] education and nutrition series. He does continue to over consume sugar/refined carbohydrates despite good understanding of  reading food labels for sugar, alternative products, etc. He continues to follow-up with his PCP for DM2. He is down about 2.4# since starting with our program. His wife remains supportive of making lifestyle changes.      Intervention Plan   Intervention Prescribe, educate and counsel regarding individualized specific dietary modifications aiming towards targeted core components such as weight, hypertension, lipid management, diabetes, heart failure and other comorbidities.;Nutrition handout(s) given to patient.    Expected Outcomes Short Term Goal: Understand basic principles of dietary content, such as calories, fat, sodium, cholesterol and nutrients.;Long Term Goal: Adherence to prescribed nutrition plan.             Nutrition Assessments:  Nutrition Assessments - 09/07/22 1206       Rate Your Plate Scores   Pre Score 50            MEDIFICTS Score Key: ?70 Need to make dietary changes  40-70 Heart Healthy Diet ? 40 Therapeutic Level Cholesterol Diet   Flowsheet Row INTENSIVE CARDIAC REHAB from 09/07/2022 in [facility] Picture Your Plate Total Score on Admission 50      Picture Your Plate Scores: <<70Unhealthy dietary pattern with much room for improvement. 41-50 Dietary pattern unlikely to meet recommendations for good health and room for improvement. 51-60 More healthful dietary pattern, with some room for improvement.  >60 Healthy dietary pattern, although there may be some specific behaviors that could be improved.    Nutrition Goals Re-Evaluation:  Nutrition Goals Re-Evaluation     [place]Name 09/07/22 1151 10/05/22 1316           Goals   Current Weight 229 lb 4.5 oz (104 kg) 233 lb 14.5 oz (106.1 kg)      Comment GFR 42, Cr 1.62, A1c 7.2, lipids WNL No new labs at this time; his most recent labs show GFR 42, Cr 1.62, A1c 7.2, lipids WNL      Expected Outcome Mr.Dimattia is precontemplative of lifestyle changes to aid with blood  sugar control and heart health. His wife, CRosemarie Ax plans to attend the [facility] education series as well. She does the majority of the grocery shopping and cooking; she is very supportive of make dietary changes. KNada Boozerand his wife continue to attend the [facility] education and nutrition series. He does continue to over consume sugar/refined carbohydrates despite good understanding of reading food labels for sugar, alternative products, etc. He continues to follow-up with his PCP for DM2. He is down about 2.4# since starting with our program however his weight has fluctuated throughout. His wife remains supportive of making lifestyle changes.               Nutrition Goals Re-Evaluation:  Nutrition Goals Re-Evaluation     [place] City[place] 09/07/22 1151 10/05/22 1316           Goals   Current Weight 229 lb 4.5 oz (104 kg) 233 lb 14.5 oz (106.1 kg)      Comment GFR 42, Cr 1.62, A1c 7.2, lipids WNL No new labs at this time; his most recent labs show GFR 42, Cr 1.62, A1c 7.2, lipids WNL      Expected Outcome Mr.Hinnenkamp is precontemplative of lifestyle changes to aid with blood sugar control and  heart health. His wife, Rosemarie Ax, plans to attend the [facility] education series as well. She does the majority of the grocery shopping and cooking; she is very supportive of make dietary changes. [name] and his wife continue to attend the [facility] education and nutrition series. He does continue to over consume sugar/refined carbohydrates despite good understanding of reading food labels for sugar, alternative products, etc. He continues to follow-up with his PCP for DM2. He is down about 2.4# since starting with our program however his weight has fluctuated throughout. His wife remains supportive of making lifestyle changes.               Nutrition Goals Discharge (Final Nutrition Goals Re-Evaluation):  Nutrition Goals Re-Evaluation - 10/05/22 1316       Goals   Current Weight 233 lb 14.5 oz (106.1 kg)     Comment No new labs at this time; his most recent labs show GFR 42, Cr 1.62, A1c 7.2, lipids WNL    Expected Outcome [place] and his wife continue to attend the [facility] education and nutrition series. He does continue to over consume sugar/refined carbohydrates despite good understanding of reading food labels for sugar, alternative products, etc. He continues to follow-up with his PCP for DM2. He is down about 2.4# since starting with our program however his weight has fluctuated throughout. His wife remains supportive of making lifestyle changes.             Psychosocial: Target Goals: Acknowledge presence or absence of significant depression and/or stress, maximize coping skills, provide positive support system. Participant is able to verbalize types and ability to use techniques and skills needed for reducing stress and depression.  Initial Review & Psychosocial Screening:  Initial Psych Review & Screening - 09/03/22 1344       Initial Review   Current issues with Current Stress Concerns    Source of Stress Concerns Chronic Illness;Unable to participate in former interests or hobbies;Unable to perform yard/household activities    Comments [name] denies being currently depressed. Wife says that [name] has been feeling down recently      [place]? Yes   [name] has his wife for support     Barriers   Psychosocial barriers to participate in program The patient should benefit from training in stress management and relaxation.      Screening Interventions   Interventions Encouraged to exercise;To provide support and resources with identified psychosocial needs    Expected Outcomes Long Term Goal: Stressors or current issues are controlled or eliminated.             Quality of Life Scores:  Quality of Life - 09/03/22 1447       Quality of Life   Select Quality of Life      Quality of Life Scores   Health/Function Pre 23.71 %    Socioeconomic Pre 30 %     Psych/Spiritual Pre 28.93 %    Family Pre 30 %    GLOBAL Pre 26.71 %            Scores of 19 and below usually indicate a poorer quality of life in these areas.  A difference of  2-3 points is a clinically meaningful difference.  A difference of 2-3 points in the total score of the Quality of Life Index has been associated with significant improvement in overall quality of life, self-image, physical symptoms, and general health in studies assessing change in quality of life.  PHQ-9: Review Flowsheet  More data exists      09/03/2022 03/02/2022 04/21/2021 02/28/2020 12/04/2019  Depression screen PHQ 2/9  Decreased Interest 0 0 0 0 0 2  Down, Depressed, Hopeless 0 0 0 0 0 1  PHQ - 2 Score 0 0 0 0 0 3  Altered sleeping - - - - 1  Tired, decreased energy - - - - 1  Change in appetite - - - - 2  Feeling bad or failure about yourself  - - - - 1  Trouble concentrating - - - - 1  Moving slowly or fidgety/restless - - - - 0  Suicidal thoughts - - - - 0  PHQ-9 Score - - - - 9  Difficult doing work/chores - - - - Not difficult at all   Interpretation of Total Score  Total Score Depression Severity:  1-4 = Minimal depression, 5-9 = Mild depression, 10-14 = Moderate depression, 15-19 = Moderately severe depression, 20-27 = Severe depression   Psychosocial Evaluation and Intervention:   Psychosocial Re-Evaluation:  Psychosocial Re-Evaluation     Row Name 09/07/22 1353 10/06/22 1700           Psychosocial Re-Evaluation   Current issues with History of Depression;Current Stress Concerns History of Depression;Current Stress Concerns      Comments [name] denies being depressed currently. Has a history of depression. [name] has stress concerns regarding his health [name] denies being depressed currently. Has a history of depression. [name] has stress concerns regarding his health. [name] has not voiced any increased concerns at cardiac rehab      Expected Outcomes [name] will have decreased stress and  controlled depression upon completion of intensive cardiac rehab [name] will have decreased stress and controlled depression upon completion of intensive cardiac rehab      Interventions Stress management education;Relaxation education;Encouraged to attend Cardiac Rehabilitation for the exercise Stress management education;Relaxation education;Encouraged to attend Cardiac Rehabilitation for the exercise      Continue Psychosocial Services  Follow up required by staff Follow up required by staff        Initial Review   Source of Stress Concerns Chronic Illness;Unable to perform yard/household activities;Unable to participate in former interests or hobbies Chronic Illness;Unable to perform yard/household activities;Unable to participate in former interests or hobbies      Comments Will contibue to monitor and offer support as needed Will contibue to monitor and offer support as needed               Psychosocial Discharge (Final Psychosocial Re-Evaluation):  Psychosocial Re-Evaluation - 10/06/22 1700       Psychosocial Re-Evaluation   Current issues with History of Depression;Current Stress Concerns    Comments [name] denies being depressed currently. Has a history of depression. [name] has stress concerns regarding his health. [name] has not voiced any increased concerns at cardiac rehab    Expected Outcomes [name] will have decreased stress and controlled depression upon completion of intensive cardiac rehab    Interventions Stress management education;Relaxation education;Encouraged to attend Cardiac Rehabilitation for the exercise    Continue Psychosocial Services  Follow up required by staff      Initial Review   Source of Stress Concerns Chronic Illness;Unable to perform yard/household activities;Unable to participate in former interests or hobbies    Comments Will contibue to monitor and offer support as needed             Vocational Rehabilitation: Provide vocational rehab assistance  to qualifying candidates.  Vocational Rehab Evaluation & Intervention:  Vocational Rehab - 09/03/22 1346       Initial Vocational Rehab Evaluation & Intervention   Assessment shows need for Vocational Rehabilitation No   [name] is retired and does not need vocational rehab at this time            Education: Education Goals: Education classes will be provided on a weekly basis, covering required topics. Participant will state understanding/return demonstration of topics presented.    Education     [place] Name 09/07/22 1200     Education   Cardiac Education Topics Pritikin   Academic librarian Exercise Education   Exercise Education Biomechanial Limitations   Instruction Review Code 1- Verbalizes Understanding   Class Start Time 1140   Class Stop Time 1216   Class Time Calculation (min) 36 min    [place] Name 09/09/22 1300     Education   Cardiac Education Topics [employer]   Educator Dietitian   Weekly Topic Fast Evening Meals   Instruction Review Code 1- Verbalizes Understanding   Class Start Time 1135   Class Stop Time 1208   Class Time Calculation (min) 33 min    [place] Name 09/11/22 1200     Education   Cardiac Education Topics Pritikin   Select Core Videos     Core Videos   Educator Nurse   Select Psychosocial   Psychosocial How Our Thoughts Can Heal Our Hearts   Instruction Review Code 1- Verbalizes Understanding   Class Start Time 1139   Class Stop Time 1219   Class Time Calculation (min) 40 min    [place] Name 09/16/22 1300     Education   Cardiac Education Topics Pritikin   Financial trader   Weekly Topic Adding Flavor - Sodium-Free   Instruction Review Code 1- Verbalizes Understanding   Class Start Time 1135   Class Stop Time 1208   Class Time Calculation (min) 33 min    [place] Name 09/18/22 1200      Education   Cardiac Education Topics Pritikin   Tax inspector General Education   General Education Heart Disease Risk Reduction   Instruction Review Code 1- Verbalizes Understanding   Class Start Time 1145   Class Stop Time 1220   Class Time Calculation (min) 35 min    [place] Name 09/21/22 1300     Education   Cardiac Education Topics Pritikin   Environmental consultant Psychosocial   Psychosocial Workshop Recognizing and Reducing Stress   Instruction Review Code 1- Verbalizes Understanding   Class Start Time 1140   Class Stop Time 1230   Class Time Calculation (min) 50 min    [place] Name 09/23/22 1300     Education   Cardiac Education Topics Pritikin   Financial trader   Weekly Topic Fast and Healthy Breakfasts   Instruction Review Code 1- Verbalizes Understanding   Class Start Time 1138   Class Stop Time 1225   Class Time Calculation (min) 47 min    [place] Name 09/30/22 1200     Education  Cardiac Education Topics [employer]   Educator Dietitian   Weekly Topic Personalizing Your Pritikin Plate   Instruction Review Code 1- Verbalizes Understanding   Class Start Time 1136   Class Stop Time 1208   Class Time Calculation (min) 32 min    [place] Name 10/05/22 1200     Education   Cardiac Education Topics Pritikin   IT sales professional Nutrition   Nutrition Workshop Label Reading   Instruction Review Code 1- Verbalizes Understanding   Class Start Time 1140   Class Stop Time 1215   Class Time Calculation (min) 35 min            Core Videos: Exercise    Move It!  Clinical staff conducted group or individual video education with verbal and written material and guidebook.  Patient learns the recommended Pritikin exercise program.  Exercise with the goal of living a long, healthy life. Some of the health benefits of exercise include controlled diabetes, healthier blood pressure levels, improved cholesterol levels, improved heart and lung capacity, improved sleep, and better body composition. Everyone should speak with their doctor before starting or changing an exercise routine.  Biomechanical Limitations Clinical staff conducted group or individual video education with verbal and written material and guidebook.  Patient learns how biomechanical limitations can impact exercise and how we can mitigate and possibly overcome limitations to have an impactful and balanced exercise routine.  Body Composition Clinical staff conducted group or individual video education with verbal and written material and guidebook.  Patient learns that body composition (ratio of muscle mass to fat mass) is a key component to assessing overall fitness, rather than body weight alone. Increased fat mass, especially visceral belly fat, can put [place] at increased risk for metabolic syndrome, type 2 diabetes, heart disease, and even death. It is recommended to combine diet and exercise (cardiovascular and resistance training) to improve your body composition. Seek guidance from your physician and exercise physiologist before implementing an exercise routine.  Exercise Action Plan Clinical staff conducted group or individual video education with verbal and written material and guidebook.  Patient learns the recommended strategies to achieve and enjoy long-term exercise adherence, including variety, self-motivation, self-efficacy, and positive decision making. Benefits of exercise include fitness, good health, weight management, more energy, better sleep, less stress, and overall well-being.  Medical   Heart Disease Risk Reduction Clinical staff conducted group or individual video education with verbal and written material and guidebook.  Patient learns our  heart is our most vital organ as it circulates oxygen, nutrients, white blood cells, and hormones throughout the entire body, and carries waste away. Data supports a plant-based eating plan like the Pritikin Program for its effectiveness in slowing progression of and reversing heart disease. The video provides a number of recommendations to address heart disease.   Metabolic Syndrome and Belly Fat  Clinical staff conducted group or individual video education with verbal and written material and guidebook.  Patient learns what metabolic syndrome is, how it leads to heart disease, and how one can reverse it and keep it from coming back. You have metabolic syndrome if you have 3 of the following 5 criteria: abdominal obesity, high blood pressure, high triglycerides, low HDL cholesterol, and high blood sugar.  Hypertension and Heart Disease Clinical staff conducted group or individual video education with verbal and written material and guidebook.  Patient  learns that high blood pressure, or hypertension, is very common in the [place]. Hypertension is largely due to excessive salt intake, but other important risk factors include being overweight, physical inactivity, drinking too much alcohol, smoking, and not eating enough potassium from fruits and vegetables. High blood pressure is a leading risk factor for heart attack, stroke, congestive heart failure, dementia, kidney failure, and premature death. Long-term effects of excessive salt intake include stiffening of the arteries and thickening of heart muscle and organ damage. Recommendations include ways to reduce hypertension and the risk of heart disease.  Diseases of Our Time - Focusing on Diabetes Clinical staff conducted group or individual video education with verbal and written material and guidebook.  Patient learns why the best way to stop diseases of our time is prevention, through food and other lifestyle changes. Medicine (such as  prescription pills and surgeries) is often only a Band-Aid on the problem, not a long-term solution. Most common diseases of our time include obesity, type 2 diabetes, hypertension, heart disease, and cancer. The Pritikin Program is recommended and has been proven to help reduce, reverse, and/or prevent the damaging effects of metabolic syndrome.  Nutrition   Overview of the Pritikin Eating Plan  Clinical staff conducted group or individual video education with verbal and written material and guidebook.  Patient learns about the [place] for disease risk reduction. The [place] emphasizes a wide variety of unrefined, minimally-processed carbohydrates, like fruits, vegetables, whole grains, and legumes. Go, Caution, and Stop food choices are explained. Plant-based and lean animal proteins are emphasized. Rationale provided for low sodium intake for blood pressure control, low added sugars for blood sugar stabilization, and low added fats and oils for coronary artery disease risk reduction and weight management.  Calorie Density  Clinical staff conducted group or individual video education with verbal and written material and guidebook.  Patient learns about calorie density and how it impacts the Pritikin Eating Plan. Knowing the characteristics of the food you choose will help you decide whether those foods will lead to weight gain or weight loss, and whether you want to consume more or less of them. Weight loss is usually a side effect of the Pritikin Eating Plan because of its focus on low calorie-dense foods.  Label Reading  Clinical staff conducted group or individual video education with verbal and written material and guidebook.  Patient learns about the Pritikin recommended label reading guidelines and corresponding recommendations regarding calorie density, added sugars, sodium content, and whole grains.  Dining Out - Part 1  Clinical staff conducted group or individual  video education with verbal and written material and guidebook.  Patient learns that restaurant meals can be sabotaging because they can be so high in calories, fat, sodium, and/or sugar. Patient learns recommended strategies on how to positively address this and avoid unhealthy pitfalls.  Facts on Fats  Clinical staff conducted group or individual video education with verbal and written material and guidebook.  Patient learns that lifestyle modifications can be just as effective, if not more so, as many medications for lowering your risk of heart disease. A Pritikin lifestyle can help to reduce your risk of inflammation and atherosclerosis (cholesterol build-up, or plaque, in the artery walls). Lifestyle interventions such as dietary choices and physical activity address the cause of atherosclerosis. A review of the types of fats and their impact on blood cholesterol levels, along with dietary recommendations to reduce fat intake is also included.  Nutrition Action Plan  Clinical staff conducted group or individual video education with verbal and written material and guidebook.  Patient learns how to incorporate Pritikin recommendations into their lifestyle. Recommendations include planning and keeping personal health goals in mind as an important part of their success.  Healthy Mind-Set    Healthy Minds, Bodies, Hearts  Clinical staff conducted group or individual video education with verbal and written material and guidebook.  Patient learns how to identify when they are stressed. Video will discuss the impact of that stress, as well as the many benefits of stress management. Patient will also be introduced to stress management techniques. The way we think, act, and feel has an impact on our hearts.  How Our Thoughts Can Heal Our Hearts  Clinical staff conducted group or individual video education with verbal and written material and guidebook.  Patient learns that negative thoughts can cause  depression and anxiety. This can result in negative lifestyle behavior and serious health problems. Cognitive behavioral therapy is an effective method to help control our thoughts in order to change and improve our emotional outlook.  Additional Videos:  Exercise    Improving Performance  Clinical staff conducted group or individual video education with verbal and written material and guidebook.  Patient learns to use a non-linear approach by alternating intensity levels and lengths of time spent exercising to help burn more calories and lose more body fat. Cardiovascular exercise helps improve heart health, metabolism, hormonal balance, blood sugar control, and recovery from fatigue. Resistance training improves strength, endurance, balance, coordination, reaction time, metabolism, and muscle mass. Flexibility exercise improves circulation, posture, and balance. Seek guidance from your physician and exercise physiologist before implementing an exercise routine and learn your capabilities and proper form for all exercise.  Introduction to Yoga  Clinical staff conducted group or individual video education with verbal and written material and guidebook.  Patient learns about yoga, a discipline of the coming together of mind, breath, and body. The benefits of yoga include improved flexibility, improved range of motion, better posture and core strength, increased lung function, weight loss, and positive self-image. Yoga's heart health benefits include lowered blood pressure, healthier heart rate, decreased cholesterol and triglyceride levels, improved immune function, and reduced stress. Seek guidance from your physician and exercise physiologist before implementing an exercise routine and learn your capabilities and proper form for all exercise.  Medical   Aging: Enhancing Your Quality of Life  Clinical staff conducted group or individual video education with verbal and written material and guidebook.   Patient learns key strategies and recommendations to stay in good physical health and enhance quality of life, such as prevention strategies, having an advocate, securing a [place], and keeping a list of medications and system for tracking them. It also discusses how to avoid risk for bone loss.  Biology of Weight Control  Clinical staff conducted group or individual video education with verbal and written material and guidebook.  Patient learns that weight gain occurs because we consume more calories than we burn (eating more, moving less). Even if your body weight is normal, you may have higher ratios of fat compared to muscle mass. Too much body fat puts you at increased risk for cardiovascular disease, heart attack, stroke, type 2 diabetes, and obesity-related cancers. In addition to exercise, following the [place] can help reduce your risk.  Decoding Lab Results  Clinical staff conducted group or individual video education with verbal and written material and guidebook.  Patient learns that lab test reflects one measurement whose values change over time and are influenced by many factors, including medication, stress, sleep, exercise, food, hydration, pre-existing medical conditions, and more. It is recommended to use the knowledge from this video to become more involved with your lab results and evaluate your numbers to speak with your doctor.   Diseases of Our Time - Overview  Clinical staff conducted group or individual video education with verbal and written material and guidebook.  Patient learns that according to the CDC, 50% to 70% of chronic diseases (such as obesity, type 2 diabetes, elevated lipids, hypertension, and heart disease) are avoidable through lifestyle improvements including healthier food choices, listening to satiety cues, and increased physical activity.  Sleep Disorders Clinical staff conducted group or individual video  education with verbal and written material and guidebook.  Patient learns how good quality and duration of sleep are important to overall health and well-being. Patient also learns about sleep disorders and how they impact health along with recommendations to address them, including discussing with a physician.  Nutrition  Dining Out - Part 2 Clinical staff conducted group or individual video education with verbal and written material and guidebook.  Patient learns how to plan ahead and communicate in order to maximize their dining experience in a healthy and nutritious manner. Included are recommended food choices based on the type of restaurant the patient is visiting.   Fueling a Best boy conducted group or individual video education with verbal and written material and guidebook.  There is a strong connection between our food choices and our health. Diseases like obesity and type 2 diabetes are very prevalent and are in large-part due to lifestyle choices. The Pritikin Eating Plan provides plenty of food and hunger-curbing satisfaction. It is easy to follow, affordable, and helps reduce health risks.  Menu Workshop  Clinical staff conducted group or individual video education with verbal and written material and guidebook.  Patient learns that restaurant meals can sabotage health goals because they are often packed with calories, fat, sodium, and sugar. Recommendations include strategies to plan ahead and to communicate with the manager, chef, or server to help order a healthier meal.  Planning Your Eating Strategy  Clinical staff conducted group or individual video education with verbal and written material and guidebook.  Patient learns about the [place] and its benefit of reducing the risk of disease. The [place] does not focus on calories. Instead, it emphasizes high-quality, nutrient-rich foods. By knowing the characteristics of the foods, we  choose, we can determine their calorie density and make informed decisions.  Targeting Your Nutrition Priorities  Clinical staff conducted group or individual video education with verbal and written material and guidebook.  Patient learns that lifestyle habits have a tremendous impact on disease risk and progression. This video provides eating and physical activity recommendations based on your personal health goals, such as reducing LDL cholesterol, losing weight, preventing or controlling type 2 diabetes, and reducing high blood pressure.  Vitamins and Minerals  Clinical staff conducted group or individual video education with verbal and written material and guidebook.  Patient learns different ways to obtain key vitamins and minerals, including through a recommended healthy diet. It is important to discuss all supplements you take with your doctor.   Healthy Mind-Set    Smoking Cessation  Clinical staff conducted group or individual video education with verbal and written material and guidebook.  Patient learns that cigarette smoking  and tobacco addiction pose a serious health risk which affects millions of people. Stopping smoking will significantly reduce the risk of heart disease, lung disease, and many forms of cancer. Recommended strategies for quitting are covered, including working with your doctor to develop a successful plan.  Culinary   Becoming a Financial trader conducted group or individual video education with verbal and written material and guidebook.  Patient learns that cooking at home can be healthy, cost-effective, quick, and puts them in control. Keys to cooking healthy recipes will include looking at your recipe, assessing your equipment needs, planning ahead, making it simple, choosing cost-effective seasonal ingredients, and limiting the use of added fats, salts, and sugars.  Cooking - Breakfast and Snacks  Clinical staff conducted group or individual  video education with verbal and written material and guidebook.  Patient learns how important breakfast is to satiety and nutrition through the entire day. Recommendations include key foods to eat during breakfast to help stabilize blood sugar levels and to prevent overeating at meals later in the day. Planning ahead is also a key component.  Cooking - Human resources officer conducted group or individual video education with verbal and written material and guidebook.  Patient learns eating strategies to improve overall health, including an approach to cook more at home. Recommendations include thinking of animal protein as a side on your plate rather than center stage and focusing instead on lower calorie dense options like vegetables, fruits, whole grains, and plant-based proteins, such as beans. Making sauces in large quantities to freeze for later and leaving the skin on your vegetables are also recommended to maximize your experience.  Cooking - Healthy Salads and Dressing Clinical staff conducted group or individual video education with verbal and written material and guidebook.  Patient learns that vegetables, fruits, whole grains, and legumes are the foundations of the [place]. Recommendations include how to incorporate each of these in flavorful and healthy salads, and how to create homemade salad dressings. Proper handling of ingredients is also covered. Cooking - Soups and [employer] - Soups and Desserts Clinical staff conducted group or individual video education with verbal and written material and guidebook.  Patient learns that Pritikin soups and desserts make for easy, nutritious, and delicious snacks and meal components that are low in sodium, fat, sugar, and calorie density, while high in vitamins, minerals, and filling fiber. Recommendations include simple and healthy ideas for soups and desserts.   Overview     The Pritikin Solution Program  Overview Clinical staff conducted group or individual video education with verbal and written material and guidebook.  Patient learns that the results of the [place] Program have been documented in more than 100 articles published in peer-reviewed journals, and the benefits include reducing risk factors for (and, in some cases, even reversing) high cholesterol, high blood pressure, type 2 diabetes, obesity, and more! An overview of the three key pillars of the Pritikin Program will be covered: eating well, doing regular exercise, and having a healthy mind-set.  WORKSHOPS  Exercise: Exercise Basics: Building Your Action Plan Clinical staff led group instruction and group discussion with PowerPoint presentation and patient guidebook. To enhance the learning environment the use of posters, models and videos may be added. At the conclusion of this workshop, patients will comprehend the difference between physical activity and exercise, as well as the benefits of incorporating both, into their routine. Patients will understand the FITT (Frequency, Intensity, Time, and  Type) principle and how to use it to build an exercise action plan. In addition, safety concerns and other considerations for exercise and cardiac rehab will be addressed by the presenter. The purpose of this lesson is to promote a comprehensive and effective weekly exercise routine in order to improve patients' overall level of fitness.   Managing Heart Disease: Your Path to a Healthier Heart Clinical staff led group instruction and group discussion with PowerPoint presentation and patient guidebook. To enhance the learning environment the use of posters, models and videos may be added.At the conclusion of this workshop, patients will understand the anatomy and physiology of the heart. Additionally, they will understand how Pritikin's three pillars impact the risk factors, the progression, and the management of heart disease.  The  purpose of this lesson is to provide a high-level overview of the heart, heart disease, and how the Pritikin lifestyle positively impacts risk factors.  Exercise Biomechanics Clinical staff led group instruction and group discussion with PowerPoint presentation and patient guidebook. To enhance the learning environment the use of posters, models and videos may be added. Patients will learn how the structural parts of their bodies function and how these functions impact their daily activities, movement, and exercise. Patients will learn how to promote a neutral spine, learn how to manage pain, and identify ways to improve their physical movement in order to promote healthy living. The purpose of this lesson is to expose patients to common physical limitations that impact physical activity. Participants will learn practical ways to adapt and manage aches and pains, and to minimize their effect on regular exercise. Patients will learn how to maintain good posture while sitting, walking, and lifting.  Balance Training and Fall Prevention  Clinical staff led group instruction and group discussion with PowerPoint presentation and patient guidebook. To enhance the learning environment the use of posters, models and videos may be added. At the conclusion of this workshop, patients will understand the importance of their sensorimotor skills (vision, proprioception, and the vestibular system) in maintaining their ability to balance as they age. Patients will apply a variety of balancing exercises that are appropriate for their current level of function. Patients will understand the common causes for poor balance, possible solutions to these problems, and ways to modify their physical environment in order to minimize their fall risk. The purpose of this lesson is to teach patients about the importance of maintaining balance as they age and ways to minimize their risk of falling.  WORKSHOPS   Nutrition:   Fueling a Scientist, research (physical sciences) led group instruction and group discussion with PowerPoint presentation and patient guidebook. To enhance the learning environment the use of posters, models and videos may be added. Patients will review the foundational principles of the [place] and understand what constitutes a serving size in each of the food groups. Patients will also learn [facility]-friendly foods that are better choices when away from home and review make-ahead meal and snack options. Calorie density will be reviewed and applied to three nutrition priorities: weight maintenance, weight loss, and weight gain. The purpose of this lesson is to reinforce (in a group setting) the key concepts around what patients are recommended to eat and how to apply these guidelines when away from home by planning and selecting [facility]rit[facility]in-friendly options. Patients will understand how calorie density may be adjusted for different weight management goals.  Mindful Eating  Clinical staff led group instruction and group discussion with PowerPoint presentation and patient guidebook. To  enhance the learning environment the use of posters, models and videos may be added. Patients will briefly review the concepts of the [place] and the importance of low-calorie dense foods. The concept of mindful eating will be introduced as well as the importance of paying attention to internal hunger signals. Triggers for non-hunger eating and techniques for dealing with triggers will be explored. The purpose of this lesson is to provide patients with the opportunity to review the basic principles of the [place], discuss the value of eating mindfully and how to measure internal cues of hunger and fullness using the Hunger Scale. Patients will also discuss reasons for non-hunger eating and learn strategies to use for controlling emotional eating.  Targeting Your Nutrition Priorities Clinical staff led  group instruction and group discussion with PowerPoint presentation and patient guidebook. To enhance the learning environment the use of posters, models and videos may be added. Patients will learn how to determine their genetic susceptibility to disease by reviewing their family history. Patients will gain insight into the importance of diet as part of an overall healthy lifestyle in mitigating the impact of genetics and other environmental insults. The purpose of this lesson is to provide patients with the opportunity to assess their personal nutrition priorities by looking at their family history, their own health history and current risk factors. Patients will also be able to discuss ways of prioritizing and modifying the [place] for their highest risk areas  Menu  Clinical staff led group instruction and group discussion with PowerPoint presentation and patient guidebook. To enhance the learning environment the use of posters, models and videos may be added. Using menus brought in from [employer], or printed from [employer], patients will apply the [place] dining out guidelines that were presented in the [employer] video. Patients will also be able to practice these guidelines in a variety of provided scenarios. The purpose of this lesson is to provide patients with the opportunity to practice hands-on learning of the [place] with actual menus and practice scenarios.  Label Reading Clinical staff led group instruction and group discussion with PowerPoint presentation and patient guidebook. To enhance the learning environment the use of posters, models and videos may be added. Patients will review and discuss the [facility] label reading guidelines presented in [facility]'s Label Reading Educational series video. Using fool labels brought in from local grocery stores and markets, patients will apply the label reading guidelines and determine  if the packaged food meet the [facility] guidelines. The purpose of this lesson is to provide patients with the opportunity to review, discuss, and practice hands-on learning of the [facility] Label Reading guidelines with actual packaged food labels. [place] Workshops are designed to teach patients ways to prepare quick, simple, and affordable recipes at home. The importance of nutrition's role in chronic disease risk reduction is reflected in its emphasis in the overall [facility] program. By learning how to prepare essential core [facility] Eating Plan recipes, patients will increase control over what they eat; be able to customize the flavor of foods without the use of added salt, sugar, or fat; and improve the quality of the food they consume. By learning a set of core recipes which are easily assembled, quickly prepared, and affordable, patients are more likely to prepare more healthy foods at home. These workshops focus on convenient breakfasts, simple entres, side dishes, and desserts which can be prepared with minimal effort  and are consistent with nutrition recommendations for cardiovascular risk reduction. Cooking [employer] are taught by a Engineer, materials (RD) who has been trained by the [employer]. The chef or RD has a clear understanding of the importance of minimizing - if not completely eliminating - added fat, sugar, and sodium in recipes. Throughout the series of [place] Workshop sessions, patients will learn about healthy ingredients and efficient methods of cooking to build confidence in their capability to prepare    Cooking School weekly topics:  Adding Flavor- Sodium-Free  Fast and Healthy Breakfasts  Powerhouse Plant-Based Proteins  Satisfying Salads and Dressings  Simple Sides and Sauces  International Cuisine-Spotlight on the [place] Zones  Delicious Desserts  Savory Soups  Efficiency Cooking - Meals in a  Snap  Tasty Appetizers and Snacks  Comforting Weekend Breakfasts  One-Pot Wonders   Fast Evening Meals  Easy [place] (Psychosocial): New Thoughts, New Behaviors Clinical staff led group instruction and group discussion with PowerPoint presentation and patient guidebook. To enhance the learning environment the use of posters, models and videos may be added. Patients will learn and practice techniques for developing effective health and lifestyle goals. Patients will be able to effectively apply the goal setting process learned to develop at least one new personal goal.  The purpose of this lesson is to expose patients to a new skill set of behavior modification techniques such as techniques setting SMART goals, overcoming barriers, and achieving new thoughts and new behaviors.  Managing Moods and Relationships Clinical staff led group instruction and group discussion with PowerPoint presentation and patient guidebook. To enhance the learning environment the use of posters, models and videos may be added. Patients will learn how emotional and chronic stress factors can impact their health and relationships. They will learn healthy ways to manage their moods and utilize positive coping mechanisms. In addition, ICR patients will learn ways to improve communication skills. The purpose of this lesson is to expose patients to ways of understanding how one's mood and health are intimately connected. Developing a healthy outlook can help build positive relationships and connections with others. Patients will understand the importance of utilizing effective communication skills that include actively listening and being heard. They will learn and understand the importance of the "4 Cs" and especially Connections in fostering of a Healthy Mind-Set.  Healthy Sleep for a Healthy Heart Clinical staff led group instruction and group discussion  with PowerPoint presentation and patient guidebook. To enhance the learning environment the use of posters, models and videos may be added. At the conclusion of this workshop, patients will be able to demonstrate knowledge of the importance of sleep to overall health, well-being, and quality of life. They will understand the symptoms of, and treatments for, common sleep disorders. Patients will also be able to identify daytime and nighttime behaviors which impact sleep, and they will be able to apply these tools to help manage sleep-related challenges. The purpose of this lesson is to provide patients with a general overview of sleep and outline the importance of quality sleep. Patients will learn about a few of the most common sleep disorders. Patients will also be introduced to the concept of "sleep hygiene," and discover ways to self-manage certain sleeping problems through simple daily behavior changes. Finally, the workshop will motivate patients by clarifying the links between quality sleep and their goals of heart-healthy living.   Recognizing and Reducing Stress  Clinical staff led group instruction and group discussion with PowerPoint presentation and patient guidebook. To enhance the learning environment the use of posters, models and videos may be added. At the conclusion of this workshop, patients will be able to understand the types of stress reactions, differentiate between acute and chronic stress, and recognize the impact that chronic stress has on their health. They will also be able to apply different coping mechanisms, such as reframing negative self-talk. Patients will have the opportunity to practice a variety of stress management techniques, such as deep abdominal breathing, progressive muscle relaxation, and/or guided imagery.  The purpose of this lesson is to educate patients on the role of stress in their lives and to provide healthy techniques for coping with it.  Learning  Barriers/Preferences:  Learning Barriers/Preferences - 09/03/22 1451       Learning Barriers/Preferences   Learning Barriers Sight   pt wears glasses   Learning Preferences Audio;Computer/Internet;Group Instruction;Individual Instruction;Pictoral;Skilled Demonstration;Verbal Instruction;Video;Written Material             Education Topics:  Knowledge Questionnaire Score:  Knowledge Questionnaire Score - 09/03/22 1452       Knowledge Questionnaire Score   Pre Score 16/24             Core Components/Risk Factors/Patient Goals at Admission:  Personal Goals and Risk Factors at Admission - 09/03/22 1454       Core Components/Risk Factors/Patient Goals on Admission    Weight Management Yes;Obesity;Weight Loss    Intervention Weight Management: Develop a combined nutrition and exercise program designed to reach desired caloric intake, while maintaining appropriate intake of nutrient and fiber, sodium and fats, and appropriate energy expenditure required for the weight goal.;Weight Management: Provide education and appropriate resources to help participant work on and attain dietary goals.;Weight Management/Obesity: Establish reasonable short term and long term weight goals.;Obesity: Provide education and appropriate resources to help participant work on and attain dietary goals.    Admit Weight 236 lb 5.3 oz (107.2 kg)    Expected Outcomes Short Term: Continue to assess and modify interventions until short term weight is achieved;Long Term: Adherence to nutrition and physical activity/exercise program aimed toward attainment of established weight goal;Weight Loss: Understanding of general recommendations for a balanced deficit meal plan, which promotes 1-2 lb weight loss per week and includes a negative energy balance of [phone] kcal/d;Understanding recommendations for meals to include 15-35% energy as protein, 25-35% energy from fat, 35-60% energy from carbohydrates, less than 266m of  dietary cholesterol, 20-35 gm of total fiber daily;Understanding of distribution of calorie intake throughout the day with the consumption of 4-5 meals/snacks    Diabetes Yes    Intervention Provide education about signs/symptoms and action to take for hypo/hyperglycemia.;Provide education about proper nutrition, including hydration, and aerobic/resistive exercise prescription along with prescribed medications to achieve blood glucose in normal ranges: Fasting glucose 65-99 mg/dL    Expected Outcomes Short Term: Participant verbalizes understanding of the signs/symptoms and immediate care of hyper/hypoglycemia, proper foot care and importance of medication, aerobic/resistive exercise and nutrition plan for blood glucose control.;Long Term: Attainment of HbA1C < 7%.    Hypertension Yes    Intervention Provide education on lifestyle modifcations including regular physical activity/exercise, weight management, moderate sodium restriction and increased consumption of fresh fruit, vegetables, and low fat dairy, alcohol moderation, and smoking cessation.;Monitor prescription use compliance.    Expected Outcomes Short Term: Continued assessment and intervention until BP is < 140/932mHG in hypertensive participants. < 130/8042mG in hypertensive  participants with diabetes, heart failure or chronic kidney disease.;Long Term: Maintenance of blood pressure at goal levels.    Lipids Yes    Intervention Provide education and support for participant on nutrition & aerobic/resistive exercise along with prescribed medications to achieve LDL <3m, HDL >460m    Expected Outcomes Short Term: Participant states understanding of desired cholesterol values and is compliant with medications prescribed. Participant is following exercise prescription and nutrition guidelines.;Long Term: Cholesterol controlled with medications as prescribed, with individualized exercise RX and with personalized nutrition plan. Value goals: LDL <  7038mHDL > 40 mg.    Stress Yes    Intervention Offer individual and/or small group education and counseling on adjustment to heart disease, stress management and health-related lifestyle change. Teach and support self-help strategies.;Refer participants experiencing significant psychosocial distress to appropriate mental health specialists for further evaluation and treatment. When possible, include family members and significant others in education/counseling sessions.    Expected Outcomes Short Term: Participant demonstrates changes in health-related behavior, relaxation and other stress management skills, ability to obtain effective social support, and compliance with psychotropic medications if prescribed.;Long Term: Emotional wellbeing is indicated by absence of clinically significant psychosocial distress or social isolation.    Personal Goal Other Yes    Personal Goal Short: increase amount of walking, flexibility Long term: Strength and stability    Intervention Will continue to monitor pt and progress workloads as tolerated without sign or symptom    Expected Outcomes Pt will achieve her goals             Core Components/Risk Factors/Patient Goals Review:   Goals and Risk Factor Review     Row Name 09/07/22 1358 10/06/22 1700           Core Components/Risk Factors/Patient Goals Review   Personal Goals Review Weight Management/Obesity;Stress;Hypertension;Lipids;Diabetes Weight Management/Obesity;Stress;Hypertension;Lipids;Diabetes      Review KenNada Boozerarted intensive cardiac rehab on 09/07/22. Kent did well with exercise for his fitness level. Used a rollator. Vital signs and CBG's were stable KenNada Boozer doing fair with exercise at intensive cardiac rehab for his fitness level. KenNada Boozermains somewhat deconditioned. Vital signs and CBG's were stable. kent ahs lost 1.1 kg since starting cardiac rehab      Expected Outcomes KenNada Boozerll continue to participate in intensvie cardiac rehab for  exercise, nutrition and lifestyle modifications. KenNada Boozerll continue to participate in intensvie cardiac rehab for exercise, nutrition and lifestyle modifications.               Core Components/Risk Factors/Patient Goals at Discharge (Final Review):   Goals and Risk Factor Review - 10/06/22 1700       Core Components/Risk Factors/Patient Goals Review   Personal Goals Review Weight Management/Obesity;Stress;Hypertension;Lipids;Diabetes    Review KenNada Boozer doing fair with exercise at intensive cardiac rehab for his fitness level. KenNada Boozermains somewhat deconditioned. Vital signs and CBG's were stable. kent ahs lost 1.1 kg since starting cardiac rehab    Expected Outcomes KenNada Boozerll continue to participate in intensvie cardiac rehab for exercise, nutrition and lifestyle modifications.             ITP Comments:  ITP Comments     Row Name 09/03/22 1336 09/07/22 1350 10/06/22 1658       ITP Comments Dr TraFransico Him, Medical Director, Introduction to [facility] Education Program/Intensive Cardiac rehab. Initial Orientation Packet Reviewed with the patient 30 Day ITP Review. KenNada Boozerarted cardiac rehab on 09/07/22 and did well with exercise for his fitness  level as the patient is deconditioned. 30 Day ITP Review. [name] has good attendance and fair participation with exercise for his fitness level.              Comments: See ITP comments.Harrell Gave RN BSN

## 2022-10-07 ENCOUNTER — Ambulatory Visit (HOSPITAL_COMMUNITY): Payer: Medicare Other

## 2022-10-07 ENCOUNTER — Encounter (HOSPITAL_COMMUNITY)
Admission: RE | Admit: 2022-10-07 | Discharge: 2022-10-07 | Disposition: A | Discharge status: Home / Self Care | Payer: Medicare Other | Source: Ambulatory Visit | Attending: Cardiovascular Disease | Admitting: Cardiovascular Disease

## 2022-10-07 DIAGNOSIS — Z952 Presence of prosthetic heart valve: Secondary | ICD-10-CM

## 2022-10-09 ENCOUNTER — Encounter (HOSPITAL_COMMUNITY): Payer: Medicare Other

## 2022-10-09 ENCOUNTER — Ambulatory Visit (HOSPITAL_COMMUNITY): Payer: Medicare Other

## 2022-10-12 ENCOUNTER — Other Ambulatory Visit: Payer: Self-pay | Admitting: Internal Medicine

## 2022-10-12 ENCOUNTER — Encounter (HOSPITAL_COMMUNITY)
Admission: RE | Admit: 2022-10-12 | Discharge: 2022-10-12 | Disposition: A | Discharge status: Home / Self Care | Payer: Medicare Other | Source: Ambulatory Visit | Attending: Cardiovascular Disease | Admitting: Cardiovascular Disease

## 2022-10-12 DIAGNOSIS — Z952 Presence of prosthetic heart valve: Secondary | ICD-10-CM

## 2022-10-12 DIAGNOSIS — E118 Type 2 diabetes mellitus with unspecified complications: Secondary | ICD-10-CM

## 2022-10-14 ENCOUNTER — Encounter (HOSPITAL_COMMUNITY)
Admission: RE | Admit: 2022-10-14 | Discharge: 2022-10-14 | Disposition: A | Discharge status: Home / Self Care | Payer: Medicare Other | Source: Ambulatory Visit | Attending: Cardiovascular Disease | Admitting: Cardiovascular Disease

## 2022-10-14 DIAGNOSIS — Z952 Presence of prosthetic heart valve: Secondary | ICD-10-CM | POA: Diagnosis not present

## 2022-10-16 ENCOUNTER — Encounter (HOSPITAL_COMMUNITY): Payer: Medicare Other

## 2022-10-19 ENCOUNTER — Ambulatory Visit (INDEPENDENT_AMBULATORY_CARE_PROVIDER_SITE_OTHER): Payer: Medicare Other

## 2022-10-19 ENCOUNTER — Encounter (HOSPITAL_COMMUNITY): Payer: Medicare Other

## 2022-10-19 ENCOUNTER — Ambulatory Visit (INDEPENDENT_AMBULATORY_CARE_PROVIDER_SITE_OTHER): Payer: Medicare Other | Admitting: Internal Medicine

## 2022-10-19 ENCOUNTER — Telehealth (HOSPITAL_COMMUNITY): Payer: Self-pay | Admitting: *Deleted

## 2022-10-19 ENCOUNTER — Encounter: Payer: Self-pay | Admitting: Internal Medicine

## 2022-10-19 VITALS — BP 132/76 | HR 60 | Temp 98.2°F | Resp 16 | Ht 67.5 in | Wt 227.0 lb

## 2022-10-19 DIAGNOSIS — S299XXA Unspecified injury of thorax, initial encounter: Secondary | ICD-10-CM

## 2022-10-19 DIAGNOSIS — M47816 Spondylosis without myelopathy or radiculopathy, lumbar region: Secondary | ICD-10-CM | POA: Diagnosis not present

## 2022-10-19 DIAGNOSIS — R079 Chest pain, unspecified: Secondary | ICD-10-CM | POA: Diagnosis not present

## 2022-10-19 DIAGNOSIS — M25511 Pain in right shoulder: Secondary | ICD-10-CM | POA: Diagnosis not present

## 2022-10-19 DIAGNOSIS — S2231XA Fracture of one rib, right side, initial encounter for closed fracture: Secondary | ICD-10-CM

## 2022-10-19 DIAGNOSIS — S4991XA Unspecified injury of right shoulder and upper arm, initial encounter: Secondary | ICD-10-CM

## 2022-10-19 DIAGNOSIS — R0781 Pleurodynia: Secondary | ICD-10-CM | POA: Diagnosis not present

## 2022-10-19 MED ORDER — HYDROCODONE-ACETAMINOPHEN 10-325 MG PO TABS: 1.0000 | ORAL_TABLET | Freq: Three times a day (TID) | ORAL | 0 refills | Status: DC | PRN

## 2022-10-19 NOTE — Progress Notes (Signed)
Subjective:  Patient ID: [name], male    DOB: 1941-03-14  Age: 81 y.o. MRN: [id number]  CC: Fall   HPI Casen Pryor Tankard presents for a fall -  He fell 3 days ago and injured his right shoulder and his right chest wall.  He complains of right shoulder pain and decreased range of motion.  He has pain over his right lateral flank.  He denies hemoptysis, cough, wheezing, abdominal pain, nausea, vomiting, or hematuria.  Outpatient Medications Prior to Visit  Medication Sig Dispense Refill   amoxicillin (AMOXIL) 500 MG tablet Take 4 tablets (2,000 mg total) by mouth as directed. 1 hour prior to dental work including cleanings 12 tablet 12   aspirin 81 MG chewable tablet Chew 1 tablet (81 mg total) by mouth daily. 90 tablet 3   atenolol-chlorthalidone (TENORETIC) 100-25 MG tablet TAKE ONE TABLET BY MOUTH ONCE DAILY 90 tablet 0   clopidogrel (PLAVIX) 75 MG tablet Take 1 tablet (75 mg total) by mouth daily with breakfast. 90 tablet 1   Continuous Blood Gluc Receiver (FREESTYLE LIBRE 2 READER) DEVI USE AS DIRECTED. 2 each 5   Continuous Blood Gluc Sensor (FREESTYLE LIBRE 2 SENSOR) MISC USE AS DIRECTED AND REPLACE EVERY 14 DAYS. 2 each 5   DULoxetine (CYMBALTA) 60 MG capsule TAKE ONE CAPSULE DAILY 90 capsule 1   fluticasone (FLONASE) 50 MCG/ACT nasal spray Place 1 spray into both nostrils daily as needed for allergies or rhinitis.     Glucagon (GVOKE HYPOPEN 2-PACK) 1 MG/0.2ML SOAJ Inject 1 Act into the skin daily as needed. 2 mL 5   Insulin Glargine-Lixisenatide (SOLIQUA) 100-33 UNT-MCG/ML SOPN Inject 50 Units into the skin daily. 54 mL 1   Insulin Pen Needle (NOVOFINE) 32G X 6 MM MISC 1 Act by Does not apply route daily. 100 each 1   JARDIANCE 25 MG TABS tablet TAKE ONE TABLET BY MOUTH ONCE DAILY BEFORE BREAKFAST 90 tablet 1   KERENDIA 20 MG TABS Take 1 tablet by mouth daily. 90 tablet 0   losartan (COZAAR) 100 MG tablet Take 1 tablet (100 mg total) by mouth daily for high blood pressure  90 tablet 1   Multiple Vitamin (MULTIVITAMIN WITH MINERALS) TABS tablet Take 1 tablet by mouth daily.     Multiple Vitamins-Minerals (PRESERVISION AREDS PO) Take 1 tablet by mouth in the morning and at bedtime.     MYRBETRIQ 50 MG TB24 tablet TAKE 1 TABLET ONCE DAILY. 90 tablet 1   pravastatin (PRAVACHOL) 40 MG tablet TAKE ONE TABLET BY MOUTH ONCE DAILY 90 tablet 0   pregabalin (LYRICA) 150 MG capsule Take 150 mg by mouth See admin instructions. Take 150 mg daily, may take an additional 150 mg twice daily as needed for pain     tamsulosin (FLOMAX) 0.4 MG CAPS capsule TAKE ONE CAPSULE BY MOUTH DAILY 90 capsule 1   vitamin B-12 (CYANOCOBALAMIN) 500 MCG tablet Take 500 mcg by mouth daily.     No facility-administered medications prior to visit.    ROS Review of Systems  Constitutional: Negative.  Negative for chills, diaphoresis, fatigue and fever.  HENT: Negative.    Eyes: Negative.   Respiratory:  Negative for chest tightness and shortness of breath.   Cardiovascular:  Positive for chest pain. Negative for palpitations and leg swelling.  Gastrointestinal:  Negative for abdominal pain, constipation, diarrhea, nausea and vomiting.  Endocrine: Negative.   Genitourinary: Negative.  Negative for difficulty urinating.  Musculoskeletal:  Positive for arthralgias. Negative  for back pain, myalgias and neck pain.  Skin: Negative.   Neurological: Negative.  Negative for dizziness and weakness.  Hematological:  Negative for adenopathy. Does not bruise/bleed easily.  Psychiatric/Behavioral: Negative.      Objective:  BP 132/76 (BP Location: Right Arm, Patient Position: Sitting, Cuff Size: Large)   Pulse 60   Temp 98.2 F (36.8 C) (Oral)   Resp 16   Ht 5' 7.5" (1.715 m)   Wt 227 lb (103 kg)   SpO2 98%   BMI 35.03 kg/m   BP Readings from Last 3 Encounters:  10/19/22 132/76  09/15/22 (!) 142/68  09/03/22 118/78    Wt Readings from Last 3 Encounters:  10/19/22 227 lb (103 kg)  09/15/22  233 lb (105.7 kg)  09/03/22 236 lb 5.3 oz (107.2 kg)    Physical Exam Vitals reviewed.  Constitutional:      General: He is in acute distress (pain).     Appearance: He is not diaphoretic.  HENT:     Mouth/Throat:     Mouth: Mucous membranes are moist.  Eyes:     General: No scleral icterus.    Conjunctiva/sclera: Conjunctivae normal.  Cardiovascular:     Rate and Rhythm: Normal rate and regular rhythm.     Heart sounds: No murmur heard. Pulmonary:     Effort: Pulmonary effort is normal.     Breath sounds: No stridor. No decreased breath sounds, wheezing, rhonchi or rales.  Abdominal:     General: Abdomen is protuberant.     Palpations: There is no mass.     Tenderness: There is no abdominal tenderness. There is no guarding.     Hernia: No hernia is present.    Musculoskeletal:     Right shoulder: Bony tenderness present. No swelling, deformity, laceration or tenderness. Decreased range of motion.     Left shoulder: Normal.     Cervical back: Neck supple.     Right lower leg: No edema.     Left lower leg: No edema.  Lymphadenopathy:     Cervical: No cervical adenopathy.  Skin:    General: Skin is warm and dry.     Findings: No rash.  Neurological:     General: No focal deficit present.  Psychiatric:        Mood and Affect: Mood normal.        Behavior: Behavior normal.     Lab Results  Component Value Date   WBC 11.7 (H) 07/15/2022   HGB 13.4 07/15/2022   HCT 41.7 07/15/2022   PLT 177 07/15/2022   GLUCOSE 103 (H) 07/24/2022   CHOL 152 12/04/2021   TRIG 96.0 12/04/2021   HDL 40.20 12/04/2021   LDLDIRECT 81.0 05/26/2018   LDLCALC 93 12/04/2021   ALT 16 07/10/2022   AST 18 07/10/2022   NA 142 07/24/2022   K 4.1 07/24/2022   CL 102 07/24/2022   CREATININE 1.55 (H) 07/24/2022   BUN 36 (H) 07/24/2022   CO2 25 07/24/2022   TSH 2.96 12/04/2021   PSA 1.05 10/28/2015   INR 1.0 07/10/2022   HGBA1C 7.2 (H) 07/10/2022   MICROALBUR 20.2 (H) 12/04/2021    DG  Chest 2 View  Result Date: 10/19/2022 CLINICAL DATA:  Right chest and shoulder pain after falling 3 days ago. EXAM: CHEST - 2 VIEW; RIGHT RIBS - 2 VIEW COMPARISON:  Chest radiographs 07/10/2022.  CT 05/18/2022. FINDINGS: Patient has undergone interval TAVR procedure. The heart size and mediastinal contours are stable.  Unchanged scarring at the left lung base with chronic blunting of the left costophrenic angle. No significant pleural effusion or pneumothorax. There is a mildly displaced acute fracture of the left 9th rib the posterolaterally. No other rib fractures are identified. 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 IMPRESSION: Mildly displaced acute fracture of the left 9th rib. No evidence of pneumothorax or significant pleural effusion. Electronically Signed   By: Richardean Sale M.D.   On: 10/19/2022 10:54   DG Ribs Unilateral Right  Result Date: 10/19/2022 CLINICAL DATA:  Right chest and shoulder pain after falling 3 days ago. EXAM: CHEST - 2 VIEW; RIGHT RIBS - 2 VIEW COMPARISON:  Chest radiographs 07/10/2022.  CT 05/18/2022. FINDINGS: Patient has undergone interval TAVR procedure. The heart size and mediastinal contours are stable. Unchanged scarring at the left lung base with chronic blunting of the left costophrenic angle. No significant pleural effusion or pneumothorax. There is a mildly displaced acute fracture of the left 9th rib the posterolaterally. No other rib fractures are identified. 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 IMPRESSION: Mildly displaced acute fracture of the left 9th rib. No evidence of pneumothorax or significant pleural effusion. Electronically Signed   By: Richardean Sale M.D.   On: 10/19/2022 10:54   DG Shoulder Right  Result Date: 10/19/2022 CLINICAL DATA:  Right chest and shoulder pain after falling 3 days ago. EXAM: RIGHT SHOULDER - 2+ VIEW COMPARISON:  None Available.  FINDINGS: The mineralization and alignment are normal. There is no evidence of acute fracture or dislocation. There are mild acromioclavicular and glenohumeral degenerative changes with possible mild subacromial spurring. No significant soft tissue abnormalities are identified. IMPRESSION: No evidence of acute fracture or dislocation. Mild degenerative changes. Electronically Signed   By: Richardean Sale M.D.   On: 10/19/2022 10:49     Assessment & Plan:   Kahlin was seen today for fall.  Diagnoses and all orders for this visit:  Injury of right shoulder, initial encounter- Plain films are negative for fracture. -     DG Shoulder Right; Future  Chest wall trauma- Chest x-ray reveals a minimally displaced right ninth rib fracture. -     DG Chest 2 View; Future -     DG Ribs Unilateral Right; Future  Closed fracture of one rib of right side, initial encounter -     HYDROcodone-acetaminophen (NORCO) 10-325 MG tablet; Take 1 tablet by mouth every 8 (eight) hours as needed.   I am having [name]. Platz start on HYDROcodone-acetaminophen. I am also having him maintain his multivitamin with minerals, Multiple Vitamins-Minerals (PRESERVISION AREDS PO), cyanocobalamin, pregabalin, NovoFine, Myrbetriq, DULoxetine, tamsulosin, fluticasone, aspirin, clopidogrel, amoxicillin, FreeStyle Libre 2 Reader, [place] Clamp, pravastatin, atenolol-chlorthalidone, Soliqua, Gvoke HypoPen 2-Pack, [employer] 2 Sensor, losartan, and Jardiance.  Meds ordered this encounter  Medications   HYDROcodone-acetaminophen (NORCO) 10-325 MG tablet    Sig: Take 1 tablet by mouth every 8 (eight) hours as needed.    Dispense:  45 tablet    Refill:  0     Follow-up: Return if symptoms worsen or fail to improve.  [name], MD

## 2022-10-19 NOTE — Telephone Encounter (Signed)
Spoke to Ryan Duke he fell Friday night while walking his dog. [name] said he hurt his right side. [name] says he has an appointment to see Dr Ronnald Ramp today at 10:00 AM. I asked [name] to check with Dr Ronnald Ramp when he can return to exercise at cardiac rehab.Harrell Gave RN BSN

## 2022-10-19 NOTE — Patient Instructions (Signed)
Rib Fracture  A rib fracture is a break or crack in one of the bones of the ribs. The ribs are long, curved bones that wrap around your chest and attach to your spine and your breastbone. The ribs protect your heart, lungs, and other organs in the chest. A broken or cracked rib is often painful but is not usually serious. Most rib fractures heal on their own over time. However, rib fractures can be more serious if multiple ribs are broken or if broken ribs move out of place and push against other structures or organs. What are the causes? This condition is caused by: Repetitive movements with high force, such as pitching a baseball or having very bad coughing spells. A direct hit the chest, such as a sports injury, a car crash, or a fall. Cancer that has spread to the bones, which can weaken bones and cause them to break. What are the signs or symptoms? Symptoms of this condition include: Pain when you breathe in or cough. Pain when someone presses on the injured area. Feeling short of breath. How is this diagnosed? This condition is diagnosed with a physical exam and medical history. You may also have imaging tests, such as: Chest X-ray. CT scan. MRI. Bone scan. Chest ultrasound. How is this treated? Treatment for this condition depends on the severity of the fracture. Most rib fractures usually heal on their own in 1-3 months. Healing may take longer if you have a cough or if you do activities that make the injury worse. While you heal, you may be given medicines to control the pain. You will also be taught deep breathing exercises. Severe injuries may require hospitalization or surgery. Follow these instructions at home: Managing pain, stiffness, and swelling If directed, put ice on the injured area. To do this: Put ice in a plastic bag. Place a towel between your skin and the bag. Leave the ice on for 20 minutes, 2-3 times a day. Remove the ice if your skin turns bright red. This is  very important. If you cannot feel pain, heat, or cold, you have a greater risk of damage to the area. Take over-the-counter and prescription medicines only as told by your health care provider. Activity Avoid doing activities or movements that cause pain. Be careful during activities and avoid bumping the injured rib. Slowly increase your activity as told by your health care provider. General instructions Do deep breathing exercises as told by your health care provider. This helps prevent pneumonia, which is a common complication of a broken rib. Your health care provider may instruct you to: Take deep breaths several times a day. Try to cough several times a day, holding a pillow against the injured area. Use a device called incentive spirometer to practice deep breathing several times a day. Drink enough fluid to keep your urine pale yellow. Do not wear a rib belt or binder. These restrict breathing, which can lead to pneumonia. Keep all follow-up visits. This is important. Contact a health care provider if: You have a fever. Get help right away if: You have difficulty breathing or you are short of breath. You develop a cough that does not stop, or you cough up thick or bloody sputum. You have nausea, vomiting, or pain in your abdomen. Your pain gets worse and medicine does not help. These symptoms may represent a serious problem that is an emergency. Do not wait to see if the symptoms will go away. Get medical help right away. Call   your local emergency services (911 in the [place] Do not drive yourself to the hospital. Summary A rib fracture is a break or crack in one of the bones of the ribs. A broken or cracked rib is often painful but is not usually serious. Most rib fractures heal on their own over time. Treatment for this condition depends on the severity of the fracture. Avoid doing any activities or movements that cause pain. This information is not intended to replace advice  given to you by your health care provider. Make sure you discuss any questions you have with your health care provider. Document Revised: 03/08/2020 Document Reviewed: 03/08/2020 Elsevier Patient Education  2023 [employer]  

## 2022-10-21 ENCOUNTER — Encounter (HOSPITAL_COMMUNITY): Payer: Medicare Other

## 2022-10-23 ENCOUNTER — Encounter (HOSPITAL_COMMUNITY): Payer: Medicare Other

## 2022-10-26 ENCOUNTER — Telehealth (HOSPITAL_COMMUNITY): Payer: Self-pay | Admitting: *Deleted

## 2022-10-26 ENCOUNTER — Encounter (HOSPITAL_COMMUNITY): Payer: Medicare Other

## 2022-10-26 NOTE — Telephone Encounter (Signed)
Spoke with [name]. Ryan Duke is interested in returning to cardiac rehab. Will check with Dr Ronnald Ramp as kent's next follow up appointment is on January 18 th.Harrell Gave RN BSN

## 2022-10-28 ENCOUNTER — Encounter (HOSPITAL_COMMUNITY): Payer: Medicare Other

## 2022-10-30 ENCOUNTER — Encounter (HOSPITAL_COMMUNITY): Payer: Medicare Other

## 2022-11-02 ENCOUNTER — Telehealth (HOSPITAL_COMMUNITY): Payer: Self-pay | Admitting: *Deleted

## 2022-11-02 NOTE — Telephone Encounter (Signed)
-----   Message from Janith Lima, MD sent at 11/02/2022  7:53 AM EST ----- Regarding: RE: Return to cardiac rehab He can restart rehab this week  TJ  ----- Message ----- From: Magda Kiel, RN Sent: 10/26/2022   8:52 AM EST To: Janith Lima, MD Subject: Return to cardiac rehab                        Good morning Dr Ronnald Ramp,   I spoke with Ryan Duke , he would like to return to cardiac rehab.  Are you okay with him returning to cardiac rehab post fall at home ?You saw Ryan Haran in the office on 10/19/22 his next follow up appointment  with you is on January 18 th/20/24. Ryan Forrer's has two more weeks of participation. Ryan Ronnald Ramp would also benefit from [facility] for fall prevention if you think it would be beneficial for him.  I appreciate your input!  Sincerely, Barnet Pall RN Cardiac Rehab

## 2022-11-02 NOTE — Telephone Encounter (Signed)
Spoke with Ryan Duke he has been cleared to return to exercise at cardiac rehab this week. Ryan Duke says he is still not feeling fell and would like to restart cardiac rehab on Monday December 11 th. Will follow up with Ryan Duke at the end of the week to see how he is doing.Harrell Gave RN BSN

## 2022-11-02 NOTE — Progress Notes (Signed)
Cardiac Individual Treatment Plan  Patient Details  Name: Ryan Duke MRN: [id number] Date of Birth: Dec 02, 1940 Referring Provider:   Flowsheet Row INTENSIVE CARDIAC REHAB ORIENT from 09/03/2022 in [facility]  Referring Provider Dr. Sherren Mocha MD       Initial Encounter Date:  [place] from 09/03/2022 in [facility] Heart, Vascular, & Lung [facility]  Date 09/03/22       Visit Diagnosis: 07/14/22 S/P TAVR Sublclavian approach  Patient's Home Medications on Admission:  Current Outpatient Medications:    amoxicillin (AMOXIL) 500 MG tablet, Take 4 tablets (2,000 mg total) by mouth as directed. 1 hour prior to dental work including cleanings, Disp: 12 tablet, Rfl: 12   aspirin 81 MG chewable tablet, Chew 1 tablet (81 mg total) by mouth daily., Disp: 90 tablet, Rfl: 3   atenolol-chlorthalidone (TENORETIC) 100-25 MG tablet, TAKE ONE TABLET BY MOUTH ONCE DAILY, Disp: 90 tablet, Rfl: 0   clopidogrel (PLAVIX) 75 MG tablet, Take 1 tablet (75 mg total) by mouth daily with breakfast., Disp: 90 tablet, Rfl: 1   Continuous Blood Gluc Receiver (FREESTYLE LIBRE 2 READER) DEVI, USE AS DIRECTED., Disp: 2 each, Rfl: 5   Continuous Blood Gluc Sensor (FREESTYLE LIBRE 2 SENSOR) MISC, USE AS DIRECTED AND REPLACE EVERY 14 DAYS., Disp: 2 each, Rfl: 5   DULoxetine (CYMBALTA) 60 MG capsule, TAKE ONE CAPSULE DAILY, Disp: 90 capsule, Rfl: 1   fluticasone (FLONASE) 50 MCG/ACT nasal spray, Place 1 spray into both nostrils daily as needed for allergies or rhinitis., Disp: , Rfl:    Glucagon (GVOKE HYPOPEN 2-PACK) 1 MG/0.2ML SOAJ, Inject 1 Act into the skin daily as needed., Disp: 2 mL, Rfl: 5   HYDROcodone-acetaminophen (NORCO) 10-325 MG tablet, Take 1 tablet by mouth every 8 (eight) hours as needed., Disp: 45 tablet, Rfl: 0   Insulin Glargine-Lixisenatide (SOLIQUA) 100-33 UNT-MCG/ML SOPN,  Inject 50 Units into the skin daily., Disp: 54 mL, Rfl: 1   Insulin Pen Needle (NOVOFINE) 32G X 6 MM MISC, 1 Act by Does not apply route daily., Disp: 100 each, Rfl: 1   JARDIANCE 25 MG TABS tablet, TAKE ONE TABLET BY MOUTH ONCE DAILY BEFORE BREAKFAST, Disp: 90 tablet, Rfl: 1   KERENDIA 20 MG TABS, Take 1 tablet by mouth daily., Disp: 90 tablet, Rfl: 0   losartan (COZAAR) 100 MG tablet, Take 1 tablet (100 mg total) by mouth daily for high blood pressure, Disp: 90 tablet, Rfl: 1   Multiple Vitamin (MULTIVITAMIN WITH MINERALS) TABS tablet, Take 1 tablet by mouth daily., Disp: , Rfl:    Multiple Vitamins-Minerals (PRESERVISION AREDS PO), Take 1 tablet by mouth in the morning and at bedtime., Disp: , Rfl:    MYRBETRIQ 50 MG TB24 tablet, TAKE 1 TABLET ONCE DAILY., Disp: 90 tablet, Rfl: 1   pravastatin (PRAVACHOL) 40 MG tablet, TAKE ONE TABLET BY MOUTH ONCE DAILY, Disp: 90 tablet, Rfl: 0   pregabalin (LYRICA) 150 MG capsule, Take 150 mg by mouth See admin instructions. 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 CBurt Knackand Dr. BCyndia Bent  Severe aortic stenosis     Tobacco Use: Social History   Tobacco Use  Smoking Status Former   Types: Cigarettes   Quit date: 11/30/1974   Years since quitting: 47.9   Passive exposure: Never  Smokeless Tobacco Never    Labs: Review Flowsheet  More data exists      Latest Ref Rng & Units 12/04/2021  03/10/2022 05/07/2022 07/10/2022 07/14/2022  Labs for ITP Cardiac and Pulmonary Rehab  Cholestrol 0 - 200 mg/dL 152  - - - -  LDL (calc) 0 - 99 mg/dL 93  - - - -  HDL-C >39.00 mg/dL 40.20  - - - -  Trlycerides 0.0 - 149.0 mg/dL 96.0  - - - -  Hemoglobin A1c 4.8 - 5.6 % 8.4  8.1  - 7.2  -  PH, Arterial 7.35 - 7.45 - - 7.311  - 7.372   PCO2 arterial 32 - 48 mmHg - - 51.5  - 46.2   Bicarbonate 20.0 - 28.0 mmol/L - - 26.3  26.0  - 26.8   TCO2 22 - 32 mmol/L - - 28  28  - _0 Acid-base deficit 0.0 - 2.0 mmol/L 0.0 - 2.0 mmol/L - - 1.0  1.0  - -  O2 Saturation % - - 78  98  - 100     Capillary Blood Glucose: Lab Results  Component Value Date   GLUCAP 120 (H) 09/23/2022   GLUCAP 168 (H) 09/23/2022   GLUCAP 102 (H) 09/09/2022   GLUCAP 107 (H) 09/09/2022   GLUCAP 140 (H) 09/07/2022     Exercise Target Goals: Exercise Program Goal: Individual exercise prescription set using results from initial 6 min walk test and THRR while considering  patient's activity barriers and safety.   Exercise Prescription Goal: Initial exercise prescription builds to 30-45 minutes a day of aerobic activity, 2-3 days per week.  Home exercise guidelines will be given to patient during program as part of exercise prescription that the participant will acknowledge.  Activity Barriers & Risk Stratification:  Activity Barriers & Cardiac Risk Stratification - 09/03/22 1441       Activity Barriers & Cardiac Risk Stratification   Activity Barriers Back Problems;Joint Problems;Balance Concerns;Deconditioning;History of Falls;Muscular Weakness;Assistive Device    Cardiac Risk Stratification High             6 Minute Walk:  6 Minute Walk     Row Name 09/03/22 1436         6 Minute Walk   Phase Initial  Nustep Stepper test: Assistive device, previous falls, instability, balance concerns     Distance 821 feet     Walk Time 6 minutes     # of Rest Breaks 0     MPH 1.55     METS 0.5     RPE 13      Perceived Dyspnea  0     VO2 Peak 1.75     Symptoms No     Resting HR 77 bpm     Resting BP 118/78     Resting Oxygen Saturation  97 %     Exercise Oxygen Saturation  during 6 min walk 95 %     Max Ex. HR 67 bpm     Max Ex. BP 124/76     2 Minute Post BP 128/68              Oxygen Initial Assessment:   Oxygen Re-Evaluation:   Oxygen Discharge (Final Oxygen Re-Evaluation):   Initial Exercise Prescription:  Initial Exercise Prescription - 09/07/22 1255       NuStep   SPM 53             Perform Capillary Blood Glucose checks as needed.  Exercise Prescription Changes:   Exercise Prescription Changes     Row Name 09/07/22 1255 09/16/22 1012 10/02/22 1021 10/14/22 1025       Response to Exercise   Blood Pressure (Admit) 142/70 152/72 142/60 142/58    Blood Pressure (Exercise) 140/62 142/70 142/66 128/70    Blood Pressure (Exit) 122/50 128/82 128/58 104/68    Heart Rate (Admit) 61 bpm 48 bpm 60 bpm 59 bpm    Heart Rate (Exercise) 81 bpm 75 bpm 81 bpm 66 bpm    Heart Rate (Exit) 61 bpm 58 bpm 69 bpm 59 bpm    Rating of Perceived Exertion (Exercise) _0 Perceived Dyspnea (Exercise) 0 0 0 0    Symptoms 0 None Patient c/o feeling tired today, possibly due to not sleeping well last night. None    Comments Pt first day in the CRP2 program -- Pt only did 17 minutes on the NuStep today due to fatigue. --    Duration Progress to 30 minutes of  aerobic without signs/symptoms of physical distress Progress to 30 minutes of  aerobic without signs/symptoms of physical distress Progress to 30 minutes of  aerobic without signs/symptoms of physical distress Progress to 30 minutes of  aerobic without signs/symptoms of physical distress    Intensity THRR unchanged THRR unchanged THRR unchanged THRR unchanged      Progression   Progression Continue to progress workloads to maintain intensity without signs/symptoms of physical distress. Continue to progress workloads to  maintain intensity without signs/symptoms of physical distress. Continue to progress workloads to maintain intensity without signs/symptoms of physical distress. Continue to progress workloads to maintain intensity without signs/symptoms of physical distress.    Average METs 1.3 1.7 1.7 1.7      Resistance Training   Training Prescription Yes No  Relaxation day, no weights. Yes No  Relaxation day, no weights.    Weight 3 -- 3 lbs --    Reps 10-15 -- 10-15 --    Time 10 Minutes -- 10 Minutes --      Interval Training   Interval Training -- No No No      NuStep   Level _1 SPM -- 72 70 69    Minutes _2 32    METs 1.3 1.7 1.7 1.7      Home Exercise Plan   Plans to continue exercise at -- -- Home (comment)  Walking Home (comment)  Walking    Frequency -- -- Add 1 additional day to program exercise sessions. Add 1 additional day to program exercise sessions.    Initial Home Exercises Provided -- -- 09/21/22 09/21/22             Exercise Comments:   Exercise Comments     Row Name 09/07/22 1259 09/16/22 1047 09/21/22 1056 10/02/22 1057 11/02/22 2841  Exercise Comments Pt first day in the CRP2 program. Pt tolerated low intensity exercise well with an average MET level of 1.3. Pt is learning his THRR, RPE and ExRx Reviewed METs with patient. Reviewed home exercise guidelines and goals with patient. Reviewed METs and goals with patient. Received MD clearance for patient to return to exercise after fall at home. Patient will resume exercise on Monday 11/09/22.            Exercise Goals and Review:   Exercise Goals     Row Name 09/03/22 1443             Exercise Goals   Increase Physical Activity Yes       Intervention Provide advice, education, support and counseling about physical activity/exercise needs.;Develop an individualized exercise prescription for aerobic and resistive training based on initial evaluation findings, risk stratification, comorbidities  and participant's personal goals.       Expected Outcomes Short Term: Attend rehab on a regular basis to increase amount of physical activity.;Long Term: Add in home exercise to make exercise part of routine and to increase amount of physical activity.;Long Term: Exercising regularly at least 3-5 days a week.       Increase Strength and Stamina Yes       Intervention Provide advice, education, support and counseling about physical activity/exercise needs.;Develop an individualized exercise prescription for aerobic and resistive training based on initial evaluation findings, risk stratification, comorbidities and participant's personal goals.       Expected Outcomes Short Term: Increase workloads from initial exercise prescription for resistance, speed, and METs.;Short Term: Perform resistance training exercises routinely during rehab and add in resistance training at home;Long Term: Improve cardiorespiratory fitness, muscular endurance and strength as measured by increased METs and functional capacity (6MWT)       Able to understand and use rate of perceived exertion (RPE) scale Yes       Intervention Provide education and explanation on how to use RPE scale       Expected Outcomes Short Term: Able to use RPE daily in rehab to express subjective intensity level;Long Term:  Able to use RPE to guide intensity level when exercising independently       Knowledge and understanding of Target Heart Rate Range (THRR) Yes       Intervention Provide education and explanation of THRR including how the numbers were predicted and where they are located for reference       Expected Outcomes Short Term: Able to state/look up THRR;Long Term: Able to use THRR to govern intensity when exercising independently;Short Term: Able to use daily as guideline for intensity in rehab       Understanding of Exercise Prescription Yes       Intervention Provide education, explanation, and written materials on patient's individual  exercise prescription       Expected Outcomes Short Term: Able to explain program exercise prescription;Long Term: Able to explain home exercise prescription to exercise independently                Exercise Goals Re-Evaluation :  Exercise Goals Re-Evaluation     Row Name 09/07/22 1257 09/21/22 1056 10/02/22 1057 11/02/22 0951       Exercise Goal Re-Evaluation   Exercise Goals Review Increase Physical Activity;Increase Strength and Stamina;Able to understand and use rate of perceived exertion (RPE) scale;Knowledge and understanding of Target Heart Rate Range (THRR);Understanding of Exercise Prescription Increase Physical Activity;Increase Strength and Stamina;Able to understand and use rate of  perceived exertion (RPE) scale;Knowledge and understanding of Target Heart Rate Range (THRR);Understanding of Exercise Prescription Increase Physical Activity;Increase Strength and Stamina;Able to understand and use rate of perceived exertion (RPE) scale;Knowledge and understanding of Target Heart Rate Range (THRR);Understanding of Exercise Prescription --    Comments Pt first day in the CRP2 program. Pt tolerated low intensity exercise well with an average MET level of 1.3. Pt is learning his THRR, RPE and ExRx Reviewed exercise prescription with patient. Patient plans to walk as his mode of exercise. Discussed accumulating 30 minutes 1-2 days/week, and patient is amenable to this. Patient making gradual progress with exercise. Patient doesn't feel he's where he needs to be with strength and stamina yet. Encouraged to increase pace and will increase level next week on the NuStep. Unable to review goals at this time. Patient has been absent from cardiac rehab since fall at home. Received clearance from patient's MD to resume exercise. Patient would like to restart next Monday 11/09/22.    Expected Outcomes Will continue to monitor pt and progress workloads as tolerated without sign or symptom Patient will  walk 30 minutes 1-2 days/week in addition to exercise at cardiac rehab as tolerated. Continue to progress workloads as tolerated. Review goals upon return to exericse.             Discharge Exercise Prescription (Final Exercise Prescription Changes):  Exercise Prescription Changes - 10/14/22 1025       Response to Exercise   Blood Pressure (Admit) 142/58    Blood Pressure (Exercise) 128/70    Blood Pressure (Exit) 104/68    Heart Rate (Admit) 59 bpm    Heart Rate (Exercise) 66 bpm    Heart Rate (Exit) 59 bpm    Rating of Perceived Exertion (Exercise) 9    Perceived Dyspnea (Exercise) 0    Symptoms None    Duration Progress to 30 minutes of  aerobic without signs/symptoms of physical distress    Intensity THRR unchanged      Progression   Progression Continue to progress workloads to maintain intensity without signs/symptoms of physical distress.    Average METs 1.7      Resistance Training   Training Prescription No   Relaxation day, no weights.     Interval Training   Interval Training No      NuStep   Level 3    SPM 69    Minutes 32    METs 1.7      Home Exercise Plan   Plans to continue exercise at Home (comment)   Walking   Frequency Add 1 additional day to program exercise sessions.    Initial Home Exercises Provided 09/21/22             Nutrition:  Target Goals: Understanding of nutrition guidelines, daily intake of sodium <1557m, cholesterol <2073m calories 30% from fat and 7% or less from saturated fats, daily to have 5 or more servings of fruits and vegetables.  Biometrics:  Pre Biometrics - 09/03/22 1444       Pre Biometrics   Waist Circumference 51 inches    Hip Circumference 45 inches    Waist to Hip Ratio 1.13 %    Triceps Skinfold 12 mm    % Body Fat 35.6 %    Grip Strength 31 kg    Flexibility --   Attempted but unable to reach.   Single Leg Stand --   Did not attempt, asssistive device, previous falls and stability issues  Nutrition Therapy Plan and Nutrition Goals:  Nutrition Therapy & Goals - 11/02/22 0832       Nutrition Therapy   Diet Heart Healthy/Carbohydrate Consistent diet    Drug/Food Interactions Statins/Certain Fruits      Personal Nutrition Goals   Nutrition Goal Patient to identify strategies for managin cardiovascular risk by attending the weekly [employer] education and nutrition series    Personal Goal #2 Patient to identify food sources and limit daily intake of saturated fat, trans fat, sodium, and refined carbohydrates    Personal Goal #3 Patient to limit to <1541m of sodium daily    Comments KNada Boozerhas not attended cardiac rehab or education since 10/14/22 due to fall at home; he is still awaiting medical clearance to return.      Intervention Plan   Intervention Prescribe, educate and counsel regarding individualized specific dietary modifications aiming towards targeted core components such as weight, hypertension, lipid management, diabetes, heart failure and other comorbidities.;Nutrition handout(s) given to patient.    Expected Outcomes Short Term Goal: Understand basic principles of dietary content, such as calories, fat, sodium, cholesterol and nutrients.;Long Term Goal: Adherence to prescribed nutrition plan.             Nutrition Assessments:  Nutrition Assessments - 09/07/22 1206       Rate Your Plate Scores   Pre Score 50            MEDIFICTS Score Key: ?70 Need to make dietary changes  40-70 Heart Healthy Diet ? 40 Therapeutic Level Cholesterol Diet   Flowsheet Row [facility]  Picture Your Plate Total Score on Admission 50      Picture Your Plate Scores: <<57Unhealthy dietary pattern with much room for improvement. 41-50 Dietary pattern unlikely to meet recommendations for good health and room for improvement. 51-60 More healthful dietary  pattern, with some room for improvement.  >60 Healthy dietary pattern, although there may be some specific behaviors that could be improved.    Nutrition Goals Re-Evaluation:  Nutrition Goals Re-Evaluation     RQuail RidgeName 09/07/22 1151 10/05/22 1316 11/02/22 0832         Goals   Current Weight 229 lb 4.5 oz (104 kg) 233 lb 14.5 oz (106.1 kg) --     Comment GFR 42, Cr 1.62, A1c 7.2, lipids WNL No new labs at this time; his most recent labs show GFR 42, Cr 1.62, A1c 7.2, lipids WNL No new labs at this time     Expected Outcome Ryan Duke is precontemplative of lifestyle changes to aid with blood sugar control and heart health. His wife, CRosemarie Ax plans to attend the [employer] education series as well. She does the majority of the grocery shopping and cooking; she is very supportive of make dietary changes. KNada Boozerand his wife continue to attend the [facility] education and nutrition series. He does continue to over consume sugar/refined carbohydrates despite good understanding of reading food labels for sugar, alternative products, etc. He continues to follow-up with his PCP for DM2. He is down about 2.4# since starting with our program however his weight has fluctuated throughout. His wife remains supportive of making lifestyle changes. KNada Boozerhas not attended cardiac rehab or education since 10/14/22 due to fall at home; he is still awaiting medical clearance to return.              Nutrition Goals Re-Evaluation:  Nutrition Goals Re-Evaluation  [place] Name 09/07/22 1151 10/05/22 1316 11/02/22 0832         Goals   Current Weight 229 lb 4.5 oz (104 kg) 233 lb 14.5 oz (106.1 kg) --     Comment GFR 42, Cr 1.62, A1c 7.2, lipids WNL No new labs at this time; his most recent labs show GFR 42, Cr 1.62, A1c 7.2, lipids WNL No new labs at this time     Expected Outcome Ryan Duke is precontemplative of lifestyle changes to aid with blood sugar control and heart health. His wife, Rosemarie Ax, plans to attend the  [employer] education series as well. She does the majority of the grocery shopping and cooking; she is very supportive of make dietary changes. Ryan Duke and his wife continue to attend the [employer] education and nutrition series. He does continue to over consume sugar/refined carbohydrates despite good understanding of reading food labels for sugar, alternative products, etc. He continues to follow-up with his PCP for DM2. He is down about 2.4# since starting with our program however his weight has fluctuated throughout. His wife remains supportive of making lifestyle changes. Ryan Duke has not attended cardiac rehab or education since 10/14/22 due to fall at home; he is still awaiting medical clearance to return.              Nutrition Goals Discharge (Final Nutrition Goals Re-Evaluation):  Nutrition Goals Re-Evaluation - 11/02/22 0832       Goals   Comment No new labs at this time    Expected Outcome Ryan Duke has not attended cardiac rehab or education since 10/14/22 due to fall at home; he is still awaiting medical clearance to return.             Psychosocial: Target Goals: Acknowledge presence or absence of significant depression and/or stress, maximize coping skills, provide positive support system. Participant is able to verbalize types and ability to use techniques and skills needed for reducing stress and depression.  Initial Review & Psychosocial Screening:  Initial Psych Review & Screening - 09/03/22 1344       Initial Review   Current issues with Current Stress Concerns    Source of Stress Concerns Chronic Illness;Unable to participate in former interests or hobbies;Unable to perform yard/household activities    Comments Ryan Duke denies being currently depressed. Wife says that Ryan Duke has been feeling down recently      [place]? Yes   Ryan Duke has his wife for support     Barriers   Psychosocial barriers to participate in program The patient should benefit from  training in stress management and relaxation.      Screening Interventions   Interventions Encouraged to exercise;To provide support and resources with identified psychosocial needs    Expected Outcomes Long Term Goal: Stressors or current issues are controlled or eliminated.             Quality of Life Scores:  Quality of Life - 09/03/22 1447       Quality of Life   Select Quality of Life      Quality of Life Scores   Health/Function Pre 23.71 %    Socioeconomic Pre 30 %    Psych/Spiritual Pre 28.93 %    Family Pre 30 %    GLOBAL Pre 26.71 %            Scores of 19 and below usually indicate a poorer quality of life in these areas.  A difference of  2-3 points  is a clinically meaningful difference.  A difference of 2-3 points in the total score of the Quality of Life Index has been associated with significant improvement in overall quality of life, self-image, physical symptoms, and general health in studies assessing change in quality of life.  PHQ-9: Review Flowsheet  More data exists      09/03/2022 03/02/2022 04/21/2021 02/28/2020 12/04/2019  Depression screen PHQ 2/9  Decreased Interest 0 0 0 0 0 2  Down, Depressed, Hopeless 0 0 0 0 0 1  PHQ - 2 Score 0 0 0 0 0 3  Altered sleeping - - - - 1  Tired, decreased energy - - - - 1  Change in appetite - - - - 2  Feeling bad or failure about yourself  - - - - 1  Trouble concentrating - - - - 1  Moving slowly or fidgety/restless - - - - 0  Suicidal thoughts - - - - 0  PHQ-9 Score - - - - 9  Difficult doing work/chores - - - - Not difficult at all   Interpretation of Total Score  Total Score Depression Severity:  1-4 = Minimal depression, 5-9 = Mild depression, 10-14 = Moderate depression, 15-19 = Moderately severe depression, 20-27 = Severe depression   Psychosocial Evaluation and Intervention:   Psychosocial Re-Evaluation:  Psychosocial Re-Evaluation     Row Name 09/07/22 1353 10/06/22 1700            Psychosocial Re-Evaluation   Current issues with History of Depression;Current Stress Concerns History of Depression;Current Stress Concerns      Comments Ryan Duke denies being depressed currently. Has a history of depression. Ryan Duke has stress concerns regarding his health Ryan Duke denies being depressed currently. Has a history of depression. Ryan Duke has stress concerns regarding his health. Ryan Duke has not voiced any increased concerns at cardiac rehab      Expected Outcomes Ryan Duke will have decreased stress and controlled depression upon completion of intensive cardiac rehab Ryan Duke will have decreased stress and controlled depression upon completion of intensive cardiac rehab      Interventions Stress management education;Relaxation education;Encouraged to attend Cardiac Rehabilitation for the exercise Stress management education;Relaxation education;Encouraged to attend Cardiac Rehabilitation for the exercise      Continue Psychosocial Services  Follow up required by staff Follow up required by staff        Initial Review   Source of Stress Concerns Chronic Illness;Unable to perform yard/household activities;Unable to participate in former interests or hobbies Chronic Illness;Unable to perform yard/household activities;Unable to participate in former interests or hobbies      Comments Will contibue to monitor and offer support as needed Will contibue to monitor and offer support as needed               Psychosocial Discharge (Final Psychosocial Re-Evaluation):  Psychosocial Re-Evaluation - 10/06/22 1700       Psychosocial Re-Evaluation   Current issues with History of Depression;Current Stress Concerns    Comments Ryan Duke denies being depressed currently. Has a history of depression. Ryan Duke has stress concerns regarding his health. Ryan Duke has not voiced any increased concerns at cardiac rehab    Expected Outcomes Ryan Duke will have decreased stress and controlled depression upon completion of intensive cardiac rehab     Interventions Stress management education;Relaxation education;Encouraged to attend Cardiac Rehabilitation for the exercise    Continue Psychosocial Services  Follow up required by staff      Initial Review   Source of Stress Concerns Chronic  Illness;Unable to perform yard/household activities;Unable to participate in former interests or hobbies    Comments Will contibue to monitor and offer support as needed             Vocational Rehabilitation: Provide vocational rehab assistance to qualifying candidates.   Vocational Rehab Evaluation & Intervention:  Vocational Rehab - 09/03/22 1346       Initial Vocational Rehab Evaluation & Intervention   Assessment shows need for Vocational Rehabilitation No   Ryan Duke is retired and does not need vocational rehab at this time            Education: Education Goals: Education classes will be provided on a weekly basis, covering required topics. Participant will state understanding/return demonstration of topics presented.    Education     Row Name 09/07/22 1200     Education   Cardiac Education Topics Pritikin   Academic librarian Exercise Education   Exercise Education Biomechanial Limitations   Instruction Review Code 1- Verbalizes Understanding   Class Start Time 1140   Class Stop Time 1216   Class Time Calculation (min) 36 min    [place] Name 09/09/22 1300     Education   Cardiac Education Topics [employer]   Educator Dietitian   Weekly Topic Fast Evening Meals   Instruction Review Code 1- Verbalizes Understanding   Class Start Time 1135   Class Stop Time 1208   Class Time Calculation (min) 33 min    [place] Name 09/11/22 1200     Education   Cardiac Education Topics Pritikin   Select Core Videos     Core Videos   Educator Nurse   Select Psychosocial   Psychosocial How Our Thoughts Can Heal Our Hearts    Instruction Review Code 1- Verbalizes Understanding   Class Start Time 1139   Class Stop Time 1219   Class Time Calculation (min) 40 min    [place] Name 09/16/22 1300     Education   Cardiac Education Topics Pritikin   Financial trader   Weekly Topic Adding Flavor - Sodium-Free   Instruction Review Code 1- Verbalizes Understanding   Class Start Time 1135   Class Stop Time 1208   Class Time Calculation (min) 33 min    [place] Name 09/18/22 1200     Education   Cardiac Education Topics Pritikin   Tax inspector General Education   General Education Heart Disease Risk Reduction   Instruction Review Code 1- Verbalizes Understanding   Class Start Time 1145   Class Stop Time 1220   Class Time Calculation (min) 35 min    [place] Name 09/21/22 1300     Education   Cardiac Education Topics Pritikin   [employer]     Workshops   Educator Exercise Physiologist   Select Psychosocial   Psychosocial Workshop Recognizing and Reducing Stress   Instruction Review Code 1- Verbalizes Understanding   Class Start Time 1140   Class Stop Time 1230   Class Time Calculation (min) 50 min    [place] Name 09/23/22 1300     Education   Cardiac Education Topics Pritikin   Financial trader  Weekly Topic Fast and Healthy Breakfasts   Instruction Review Code 1- Verbalizes Understanding   Class Start Time 1138   Class Stop Time 1225   Class Time Calculation (min) 47 min    [place] Name 09/30/22 1200     Education   Cardiac Education Topics [place]   Educator Dietitian   Weekly Topic Personalizing Your Pritikin Plate   Instruction Review Code 1- Verbalizes Understanding   Class Start Time 1610   Class Stop Time 1208   Class Time Calculation (min) 32 min    [place] Name 10/05/22 1200     Education   Cardiac  Education Topics Pritikin   IT sales professional Nutrition   Nutrition Workshop Label Reading   Instruction Review Code 1- Verbalizes Understanding   Class Start Time 1140   Class Stop Time 1215   Class Time Calculation (min) 35 min    [place] Name 10/07/22 1200     Education   Cardiac Education Topics Pritikin   Financial trader   Weekly Topic Tasty Appetizers and Snacks   Instruction Review Code 1- Verbalizes Understanding   Class Start Time 1145   Class Stop Time 1230   Class Time Calculation (min) 45 min    [place] Name 10/12/22 1200     Education   Cardiac Education Topics Pritikin   Environmental consultant Psychosocial   Psychosocial Workshop Healthy Sleep for a Healthy Heart   Instruction Review Code 1- Verbalizes Understanding   Class Start Time 1155   Class Stop Time 1245   Class Time Calculation (min) 50 min    [place] Name 10/14/22 1300     Education   Cardiac Education Topics Pritikin   Financial trader   Weekly Topic [employer] Desserts   Instruction Review Code 1- Verbalizes Understanding   Class Start Time 1145   Class Stop Time 1226   Class Time Calculation (min) 41 min            Core Videos: Exercise    Move It!  Clinical staff conducted group or individual video education with verbal and written material and guidebook.  Patient learns the recommended Pritikin exercise program. Exercise with the goal of living a long, healthy life. Some of the health benefits of exercise include controlled diabetes, healthier blood pressure levels, improved cholesterol levels, improved heart and lung capacity, improved sleep, and better body composition. Everyone should speak with their doctor before starting or changing an exercise routine.  Biomechanical Limitations Clinical staff  conducted group or individual video education with verbal and written material and guidebook.  Patient learns how biomechanical limitations can impact exercise and how we can mitigate and possibly overcome limitations to have an impactful and balanced exercise routine.  Body Composition Clinical staff conducted group or individual video education with verbal and written material and guidebook.  Patient learns that body composition (ratio of muscle mass to fat mass) is a key component to assessing overall fitness, rather than body weight alone. Increased fat mass, especially visceral belly fat, can put [place] at increased risk for metabolic syndrome, type 2 diabetes, heart disease, and even death. It is recommended to combine diet and exercise (cardiovascular and  resistance training) to improve your body composition. Seek guidance from your physician and exercise physiologist before implementing an exercise routine.  Exercise Action Plan Clinical staff conducted group or individual video education with verbal and written material and guidebook.  Patient learns the recommended strategies to achieve and enjoy long-term exercise adherence, including variety, self-motivation, self-efficacy, and positive decision making. Benefits of exercise include fitness, good health, weight management, more energy, better sleep, less stress, and overall well-being.  Medical   Heart Disease Risk Reduction Clinical staff conducted group or individual video education with verbal and written material and guidebook.  Patient learns our heart is our most vital organ as it circulates oxygen, nutrients, white blood cells, and hormones throughout the entire body, and carries waste away. Data supports a plant-based eating plan like the Pritikin Program for its effectiveness in slowing progression of and reversing heart disease. The video provides a number of recommendations to address heart disease.   Metabolic Syndrome and Belly  Fat  Clinical staff conducted group or individual video education with verbal and written material and guidebook.  Patient learns what metabolic syndrome is, how it leads to heart disease, and how one can reverse it and keep it from coming back. You have metabolic syndrome if you have 3 of the following 5 criteria: abdominal obesity, high blood pressure, high triglycerides, low HDL cholesterol, and high blood sugar.  Hypertension and Heart Disease Clinical staff conducted group or individual video education with verbal and written material and guidebook.  Patient learns that high blood pressure, or hypertension, is very common in the [place]. Hypertension is largely due to excessive salt intake, but other important risk factors include being overweight, physical inactivity, drinking too much alcohol, smoking, and not eating enough potassium from fruits and vegetables. High blood pressure is a leading risk factor for heart attack, stroke, congestive heart failure, dementia, kidney failure, and premature death. Long-term effects of excessive salt intake include stiffening of the arteries and thickening of heart muscle and organ damage. Recommendations include ways to reduce hypertension and the risk of heart disease.  Diseases of Our Time - Focusing on Diabetes Clinical staff conducted group or individual video education with verbal and written material and guidebook.  Patient learns why the best way to stop diseases of our time is prevention, through food and other lifestyle changes. Medicine (such as prescription pills and surgeries) is often only a Band-Aid on the problem, not a long-term solution. Most common diseases of our time include obesity, type 2 diabetes, hypertension, heart disease, and cancer. The Pritikin Program is recommended and has been proven to help reduce, reverse, and/or prevent the damaging effects of metabolic syndrome.  Nutrition   Overview of the Pritikin Eating Plan   Clinical staff conducted group or individual video education with verbal and written material and guidebook.  Patient learns about the [place] for disease risk reduction. The [place] emphasizes a wide variety of unrefined, minimally-processed carbohydrates, like fruits, vegetables, whole grains, and legumes. Go, Caution, and Stop food choices are explained. Plant-based and lean animal proteins are emphasized. Rationale provided for low sodium intake for blood pressure control, low added sugars for blood sugar stabilization, and low added fats and oils for coronary artery disease risk reduction and weight management.  Calorie Density  Clinical staff conducted group or individual video education with verbal and written material and guidebook.  Patient learns about calorie density and how it impacts the Pritikin Eating Plan. Knowing the characteristics of  the food you choose will help you decide whether those foods will lead to weight gain or weight loss, and whether you want to consume more or less of them. Weight loss is usually a side effect of the Pritikin Eating Plan because of its focus on low calorie-dense foods.  Label Reading  Clinical staff conducted group or individual video education with verbal and written material and guidebook.  Patient learns about the Pritikin recommended label reading guidelines and corresponding recommendations regarding calorie density, added sugars, sodium content, and whole grains.  Dining Out - Part 1  Clinical staff conducted group or individual video education with verbal and written material and guidebook.  Patient learns that restaurant meals can be sabotaging because they can be so high in calories, fat, sodium, and/or sugar. Patient learns recommended strategies on how to positively address this and avoid unhealthy pitfalls.  Facts on Fats  Clinical staff conducted group or individual video education with verbal and written  material and guidebook.  Patient learns that lifestyle modifications can be just as effective, if not more so, as many medications for lowering your risk of heart disease. A Pritikin lifestyle can help to reduce your risk of inflammation and atherosclerosis (cholesterol build-up, or plaque, in the artery walls). Lifestyle interventions such as dietary choices and physical activity address the cause of atherosclerosis. A review of the types of fats and their impact on blood cholesterol levels, along with dietary recommendations to reduce fat intake is also included.  Nutrition Action Plan  Clinical staff conducted group or individual video education with verbal and written material and guidebook.  Patient learns how to incorporate Pritikin recommendations into their lifestyle. Recommendations include planning and keeping personal health goals in mind as an important part of their success.  Healthy Mind-Set    Healthy Minds, Bodies, Hearts  Clinical staff conducted group or individual video education with verbal and written material and guidebook.  Patient learns how to identify when they are stressed. Video will discuss the impact of that stress, as well as the many benefits of stress management. Patient will also be introduced to stress management techniques. The way we think, act, and feel has an impact on our hearts.  How Our Thoughts Can Heal Our Hearts  Clinical staff conducted group or individual video education with verbal and written material and guidebook.  Patient learns that negative thoughts can cause depression and anxiety. This can result in negative lifestyle behavior and serious health problems. Cognitive behavioral therapy is an effective method to help control our thoughts in order to change and improve our emotional outlook.  Additional Videos:  Exercise    Improving Performance  Clinical staff conducted group or individual video education with verbal and written material and  guidebook.  Patient learns to use a non-linear approach by alternating intensity levels and lengths of time spent exercising to help burn more calories and lose more body fat. Cardiovascular exercise helps improve heart health, metabolism, hormonal balance, blood sugar control, and recovery from fatigue. Resistance training improves strength, endurance, balance, coordination, reaction time, metabolism, and muscle mass. Flexibility exercise improves circulation, posture, and balance. Seek guidance from your physician and exercise physiologist before implementing an exercise routine and learn your capabilities and proper form for all exercise.  Introduction to Yoga  Clinical staff conducted group or individual video education with verbal and written material and guidebook.  Patient learns about yoga, a discipline of the coming together of mind, breath, and body. The benefits of yoga include  improved flexibility, improved range of motion, better posture and core strength, increased lung function, weight loss, and positive self-image. Yoga's heart health benefits include lowered blood pressure, healthier heart rate, decreased cholesterol and triglyceride levels, improved immune function, and reduced stress. Seek guidance from your physician and exercise physiologist before implementing an exercise routine and learn your capabilities and proper form for all exercise.  Medical   Aging: Enhancing Your Quality of Life  Clinical staff conducted group or individual video education with verbal and written material and guidebook.  Patient learns key strategies and recommendations to stay in good physical health and enhance quality of life, such as prevention strategies, having an advocate, securing a [place], and keeping a list of medications and system for tracking them. It also discusses how to avoid risk for bone loss.  Biology of Weight Control  Clinical staff conducted group or  individual video education with verbal and written material and guidebook.  Patient learns that weight gain occurs because we consume more calories than we burn (eating more, moving less). Even if your body weight is normal, you may have higher ratios of fat compared to muscle mass. Too much body fat puts you at increased risk for cardiovascular disease, heart attack, stroke, type 2 diabetes, and obesity-related cancers. In addition to exercise, following the [place] can help reduce your risk.  Decoding Lab Results  Clinical staff conducted group or individual video education with verbal and written material and guidebook.  Patient learns that lab test reflects one measurement whose values change over time and are influenced by many factors, including medication, stress, sleep, exercise, food, hydration, pre-existing medical conditions, and more. It is recommended to use the knowledge from this video to become more involved with your lab results and evaluate your numbers to speak with your doctor.   Diseases of Our Time - Overview  Clinical staff conducted group or individual video education with verbal and written material and guidebook.  Patient learns that according to the CDC, 50% to 70% of chronic diseases (such as obesity, type 2 diabetes, elevated lipids, hypertension, and heart disease) are avoidable through lifestyle improvements including healthier food choices, listening to satiety cues, and increased physical activity.  Sleep Disorders Clinical staff conducted group or individual video education with verbal and written material and guidebook.  Patient learns how good quality and duration of sleep are important to overall health and well-being. Patient also learns about sleep disorders and how they impact health along with recommendations to address them, including discussing with a physician.  Nutrition  Dining Out - Part 2 Clinical staff conducted group or individual video  education with verbal and written material and guidebook.  Patient learns how to plan ahead and communicate in order to maximize their dining experience in a healthy and nutritious manner. Included are recommended food choices based on the type of restaurant the patient is visiting.   Fueling a Best boy conducted group or individual video education with verbal and written material and guidebook.  There is a strong connection between our food choices and our health. Diseases like obesity and type 2 diabetes are very prevalent and are in large-part due to lifestyle choices. The Pritikin Eating Plan provides plenty of food and hunger-curbing satisfaction. It is easy to follow, affordable, and helps reduce health risks.  Menu Workshop  Clinical staff conducted group or individual video education with verbal and written material and guidebook.  Patient learns  that restaurant meals can sabotage health goals because they are often packed with calories, fat, sodium, and sugar. Recommendations include strategies to plan ahead and to communicate with the manager, chef, or server to help order a healthier meal.  Planning Your Eating Strategy  Clinical staff conducted group or individual video education with verbal and written material and guidebook.  Patient learns about the [place] and its benefit of reducing the risk of disease. The [place] does not focus on calories. Instead, it emphasizes high-quality, nutrient-rich foods. By knowing the characteristics of the foods, we choose, we can determine their calorie density and make informed decisions.  Targeting Your Nutrition Priorities  Clinical staff conducted group or individual video education with verbal and written material and guidebook.  Patient learns that lifestyle habits have a tremendous impact on disease risk and progression. This video provides eating and physical activity recommendations based on your  personal health goals, such as reducing LDL cholesterol, losing weight, preventing or controlling type 2 diabetes, and reducing high blood pressure.  Vitamins and Minerals  Clinical staff conducted group or individual video education with verbal and written material and guidebook.  Patient learns different ways to obtain key vitamins and minerals, including through a recommended healthy diet. It is important to discuss all supplements you take with your doctor.   Healthy Mind-Set    Smoking Cessation  Clinical staff conducted group or individual video education with verbal and written material and guidebook.  Patient learns that cigarette smoking and tobacco addiction pose a serious health risk which affects millions of people. Stopping smoking will significantly reduce the risk of heart disease, lung disease, and many forms of cancer. Recommended strategies for quitting are covered, including working with your doctor to develop a successful plan.  Culinary   Becoming a Financial trader conducted group or individual video education with verbal and written material and guidebook.  Patient learns that cooking at home can be healthy, cost-effective, quick, and puts them in control. Keys to cooking healthy recipes will include looking at your recipe, assessing your equipment needs, planning ahead, making it simple, choosing cost-effective seasonal ingredients, and limiting the use of added fats, salts, and sugars.  Cooking - Breakfast and Snacks  Clinical staff conducted group or individual video education with verbal and written material and guidebook.  Patient learns how important breakfast is to satiety and nutrition through the entire day. Recommendations include key foods to eat during breakfast to help stabilize blood sugar levels and to prevent overeating at meals later in the day. Planning ahead is also a key component.  Cooking - Human resources officer conducted  group or individual video education with verbal and written material and guidebook.  Patient learns eating strategies to improve overall health, including an approach to cook more at home. Recommendations include thinking of animal protein as a side on your plate rather than center stage and focusing instead on lower calorie dense options like vegetables, fruits, whole grains, and plant-based proteins, such as beans. Making sauces in large quantities to freeze for later and leaving the skin on your vegetables are also recommended to maximize your experience.  Cooking - Healthy Salads and Dressing Clinical staff conducted group or individual video education with verbal and written material and guidebook.  Patient learns that vegetables, fruits, whole grains, and legumes are the foundations of the [place]. Recommendations include how to incorporate each of these in flavorful and healthy salads,  and how to create homemade salad dressings. Proper handling of ingredients is also covered. Cooking - Soups and [employer] - Soups and Desserts Clinical staff conducted group or individual video education with verbal and written material and guidebook.  Patient learns that Pritikin soups and desserts make for easy, nutritious, and delicious snacks and meal components that are low in sodium, fat, sugar, and calorie density, while high in vitamins, minerals, and filling fiber. Recommendations include simple and healthy ideas for soups and desserts.   Overview     The Pritikin Solution Program Overview Clinical staff conducted group or individual video education with verbal and written material and guidebook.  Patient learns that the results of the [place] Program have been documented in more than 100 articles published in peer-reviewed journals, and the benefits include reducing risk factors for (and, in some cases, even reversing) high cholesterol, high blood pressure, type 2 diabetes, obesity,  and more! An overview of the three key pillars of the Pritikin Program will be covered: eating well, doing regular exercise, and having a healthy mind-set.  WORKSHOPS  Exercise: Exercise Basics: Building Your Action Plan Clinical staff led group instruction and group discussion with PowerPoint presentation and patient guidebook. To enhance the learning environment the use of posters, models and videos may be added. At the conclusion of this workshop, patients will comprehend the difference between physical activity and exercise, as well as the benefits of incorporating both, into their routine. Patients will understand the FITT (Frequency, Intensity, Time, and Type) principle and how to use it to build an exercise action plan. In addition, safety concerns and other considerations for exercise and cardiac rehab will be addressed by the presenter. The purpose of this lesson is to promote a comprehensive and effective weekly exercise routine in order to improve patients' overall level of fitness.   Managing Heart Disease: Your Path to a Healthier Heart Clinical staff led group instruction and group discussion with PowerPoint presentation and patient guidebook. To enhance the learning environment the use of posters, models and videos may be added.At the conclusion of this workshop, patients will understand the anatomy and physiology of the heart. Additionally, they will understand how Pritikin's three pillars impact the risk factors, the progression, and the management of heart disease.  The purpose of this lesson is to provide a high-level overview of the heart, heart disease, and how the Pritikin lifestyle positively impacts risk factors.  Exercise Biomechanics Clinical staff led group instruction and group discussion with PowerPoint presentation and patient guidebook. To enhance the learning environment the use of posters, models and videos may be added. Patients will learn how the structural  parts of their bodies function and how these functions impact their daily activities, movement, and exercise. Patients will learn how to promote a neutral spine, learn how to manage pain, and identify ways to improve their physical movement in order to promote healthy living. The purpose of this lesson is to expose patients to common physical limitations that impact physical activity. Participants will learn practical ways to adapt and manage aches and pains, and to minimize their effect on regular exercise. Patients will learn how to maintain good posture while sitting, walking, and lifting.  Balance Training and Fall Prevention  Clinical staff led group instruction and group discussion with PowerPoint presentation and patient guidebook. To enhance the learning environment the use of posters, models and videos may be added. At the conclusion of this workshop, patients will understand the importance of their sensorimotor skills (  vision, proprioception, and the vestibular system) in maintaining their ability to balance as they age. Patients will apply a variety of balancing exercises that are appropriate for their current level of function. Patients will understand the common causes for poor balance, possible solutions to these problems, and ways to modify their physical environment in order to minimize their fall risk. The purpose of this lesson is to teach patients about the importance of maintaining balance as they age and ways to minimize their risk of falling.  WORKSHOPS   Nutrition:  Fueling a Scientist, research (physical sciences) led group instruction and group discussion with PowerPoint presentation and patient guidebook. To enhance the learning environment the use of posters, models and videos may be added. Patients will review the foundational principles of the [place] and understand what constitutes a serving size in each of the food groups. Patients will also learn Pritikin-friendly  foods that are better choices when away from home and review make-ahead meal and snack options. Calorie density will be reviewed and applied to three nutrition priorities: weight maintenance, weight loss, and weight gain. The purpose of this lesson is to reinforce (in a group setting) the key concepts around what patients are recommended to eat and how to apply these guidelines when away from home by planning and selecting Pritikin-friendly options. Patients will understand how calorie density may be adjusted for different weight management goals.  Mindful Eating  Clinical staff led group instruction and group discussion with PowerPoint presentation and patient guidebook. To enhance the learning environment the use of posters, models and videos may be added. Patients will briefly review the concepts of the [place] and the importance of low-calorie dense foods. The concept of mindful eating will be introduced as well as the importance of paying attention to internal hunger signals. Triggers for non-hunger eating and techniques for dealing with triggers will be explored. The purpose of this lesson is to provide patients with the opportunity to review the basic principles of the [place], discuss the value of eating mindfully and how to measure internal cues of hunger and fullness using the Hunger Scale. Patients will also discuss reasons for non-hunger eating and learn strategies to use for controlling emotional eating.  Targeting Your Nutrition Priorities Clinical staff led group instruction and group discussion with PowerPoint presentation and patient guidebook. To enhance the learning environment the use of posters, models and videos may be added. Patients will learn how to determine their genetic susceptibility to disease by reviewing their family history. Patients will gain insight into the importance of diet as part of an overall healthy lifestyle in mitigating the impact of  genetics and other environmental insults. The purpose of this lesson is to provide patients with the opportunity to assess their personal nutrition priorities by looking at their family history, their own health history and current risk factors. Patients will also be able to discuss ways of prioritizing and modifying the [place] for their highest risk areas  Menu  Clinical staff led group instruction and group discussion with PowerPoint presentation and patient guidebook. To enhance the learning environment the use of posters, models and videos may be added. Using menus brought in from [employer], or printed from [employer], patients will apply the [place] dining out guidelines that were presented in the [employer] video. Patients will also be able to practice these guidelines in a variety of provided scenarios. The purpose of this lesson is to provide patients  with the opportunity to practice hands-on learning of the [employer] with actual menus and practice scenarios.  Label Reading Clinical staff led group instruction and group discussion with PowerPoint presentation and patient guidebook. To enhance the learning environment the use of posters, models and videos may be added. Patients will review and discuss the Pritikin label reading guidelines presented in Pritikin's Label Reading Educational series video. Using fool labels brought in from local grocery stores and markets, patients will apply the label reading guidelines and determine if the packaged food meet the Pritikin guidelines. The purpose of this lesson is to provide patients with the opportunity to review, discuss, and practice hands-on learning of the Pritikin Label Reading guidelines with actual packaged food labels. [place] Workshops are designed to teach patients ways to prepare quick, simple, and affordable recipes at home. The importance of  nutrition's role in chronic disease risk reduction is reflected in its emphasis in the overall Pritikin program. By learning how to prepare essential core Pritikin Eating Plan recipes, patients will increase control over what they eat; be able to customize the flavor of foods without the use of added salt, sugar, or fat; and improve the quality of the food they consume. By learning a set of core recipes which are easily assembled, quickly prepared, and affordable, patients are more likely to prepare more healthy foods at home. These workshops focus on convenient breakfasts, simple entres, side dishes, and desserts which can be prepared with minimal effort and are consistent with nutrition recommendations for cardiovascular risk reduction. Cooking [employer] are taught by a Engineer, materials (RD) who has been trained by the [employer]. The chef or RD has a clear understanding of the importance of minimizing - if not completely eliminating - added fat, sugar, and sodium in recipes. Throughout the series of [place] Workshop sessions, patients will learn about healthy ingredients and efficient methods of cooking to build confidence in their capability to prepare    Cooking School weekly topics:  Adding Flavor- Sodium-Free  Fast and Healthy Breakfasts  Powerhouse Plant-Based Proteins  Satisfying Salads and Dressings  Simple Sides and Sauces  International Cuisine-Spotlight on the [place] Zones  Delicious Desserts  Savory Soups  Efficiency Cooking - Meals in a Snap  Tasty Appetizers and Snacks  Comforting Weekend Breakfasts  One-Pot Wonders   Fast Evening Meals  Easy [place] (Psychosocial): New Thoughts, New Behaviors Clinical staff led group instruction and group discussion with PowerPoint presentation and patient guidebook. To enhance the learning environment the use of posters, models and  videos may be added. Patients will learn and practice techniques for developing effective health and lifestyle goals. Patients will be able to effectively apply the goal setting process learned to develop at least one new personal goal.  The purpose of this lesson is to expose patients to a new skill set of behavior modification techniques such as techniques setting SMART goals, overcoming barriers, and achieving new thoughts and new behaviors.  Managing Moods and Relationships Clinical staff led group instruction and group discussion with PowerPoint presentation and patient guidebook. To enhance the learning environment the use of posters, models and videos may be added. Patients will learn how emotional and chronic stress factors can impact their health and relationships. They will learn healthy ways to manage their moods and utilize positive coping mechanisms. In addition, ICR patients will learn ways to  improve communication skills. The purpose of this lesson is to expose patients to ways of understanding how one's mood and health are intimately connected. Developing a healthy outlook can help build positive relationships and connections with others. Patients will understand the importance of utilizing effective communication skills that include actively listening and being heard. They will learn and understand the importance of the "4 Cs" and especially Connections in fostering of a Healthy Mind-Set.  Healthy Sleep for a Healthy Heart Clinical staff led group instruction and group discussion with PowerPoint presentation and patient guidebook. To enhance the learning environment the use of posters, models and videos may be added. At the conclusion of this workshop, patients will be able to demonstrate knowledge of the importance of sleep to overall health, well-being, and quality of life. They will understand the symptoms of, and treatments for, common sleep disorders. Patients will also be able to  identify daytime and nighttime behaviors which impact sleep, and they will be able to apply these tools to help manage sleep-related challenges. The purpose of this lesson is to provide patients with a general overview of sleep and outline the importance of quality sleep. Patients will learn about a few of the most common sleep disorders. Patients will also be introduced to the concept of "sleep hygiene," and discover ways to self-manage certain sleeping problems through simple daily behavior changes. Finally, the workshop will motivate patients by clarifying the links between quality sleep and their goals of heart-healthy living.   Recognizing and Reducing Stress Clinical staff led group instruction and group discussion with PowerPoint presentation and patient guidebook. To enhance the learning environment the use of posters, models and videos may be added. At the conclusion of this workshop, patients will be able to understand the types of stress reactions, differentiate between acute and chronic stress, and recognize the impact that chronic stress has on their health. They will also be able to apply different coping mechanisms, such as reframing negative self-talk. Patients will have the opportunity to practice a variety of stress management techniques, such as deep abdominal breathing, progressive muscle relaxation, and/or guided imagery.  The purpose of this lesson is to educate patients on the role of stress in their lives and to provide healthy techniques for coping with it.  Learning Barriers/Preferences:  Learning Barriers/Preferences - 09/03/22 1451       Learning Barriers/Preferences   Learning Barriers Sight   pt wears glasses   Learning Preferences Audio;Computer/Internet;Group Instruction;Individual Instruction;Pictoral;Skilled Demonstration;Verbal Instruction;Video;Written Material             Education Topics:  Knowledge Questionnaire Score:  Knowledge Questionnaire Score -  09/03/22 1452       Knowledge Questionnaire Score   Pre Score 16/24             Core Components/Risk Factors/Patient Goals at Admission:  Personal Goals and Risk Factors at Admission - 09/03/22 1454       Core Components/Risk Factors/Patient Goals on Admission    Weight Management Yes;Obesity;Weight Loss    Intervention Weight Management: Develop a combined nutrition and exercise program designed to reach desired caloric intake, while maintaining appropriate intake of nutrient and fiber, sodium and fats, and appropriate energy expenditure required for the weight goal.;Weight Management: Provide education and appropriate resources to help participant work on and attain dietary goals.;Weight Management/Obesity: Establish reasonable short term and long term weight goals.;Obesity: Provide education and appropriate resources to help participant work on and attain dietary goals.    Admit Weight 236 lb 5.3  oz (107.2 kg)    Expected Outcomes Short Term: Continue to assess and modify interventions until short term weight is achieved;Long Term: Adherence to nutrition and physical activity/exercise program aimed toward attainment of established weight goal;Weight Loss: Understanding of general recommendations for a balanced deficit meal plan, which promotes 1-2 lb weight loss per week and includes a negative energy balance of [id number] kcal/d;Understanding recommendations for meals to include 15-35% energy as protein, 25-35% energy from fat, 35-60% energy from carbohydrates, less than 248m of dietary cholesterol, 20-35 gm of total fiber daily;Understanding of distribution of calorie intake throughout the day with the consumption of 4-5 meals/snacks    Diabetes Yes    Intervention Provide education about signs/symptoms and action to take for hypo/hyperglycemia.;Provide education about proper nutrition, including hydration, and aerobic/resistive exercise prescription along with prescribed medications to  achieve blood glucose in normal ranges: Fasting glucose 65-99 mg/dL    Expected Outcomes Short Term: Participant verbalizes understanding of the signs/symptoms and immediate care of hyper/hypoglycemia, proper foot care and importance of medication, aerobic/resistive exercise and nutrition plan for blood glucose control.;Long Term: Attainment of HbA1C < 7%.    Hypertension Yes    Intervention Provide education on lifestyle modifcations including regular physical activity/exercise, weight management, moderate sodium restriction and increased consumption of fresh fruit, vegetables, and low fat dairy, alcohol moderation, and smoking cessation.;Monitor prescription use compliance.    Expected Outcomes Short Term: Continued assessment and intervention until BP is < 140/969mHG in hypertensive participants. < 130/8085mG in hypertensive participants with diabetes, heart failure or chronic kidney disease.;Long Term: Maintenance of blood pressure at goal levels.    Lipids Yes    Intervention Provide education and support for participant on nutrition & aerobic/resistive exercise along with prescribed medications to achieve LDL <64m54mDL >40mg81m Expected Outcomes Short Term: Participant states understanding of desired cholesterol values and is compliant with medications prescribed. Participant is following exercise prescription and nutrition guidelines.;Long Term: Cholesterol controlled with medications as prescribed, with individualized exercise RX and with personalized nutrition plan. Value goals: LDL < 64mg,77m > 40 mg.    Stress Yes    Intervention Offer individual and/or small group education and counseling on adjustment to heart disease, stress management and health-related lifestyle change. Teach and support self-help strategies.;Refer participants experiencing significant psychosocial distress to appropriate mental health specialists for further evaluation and treatment. When possible, include family  members and significant others in education/counseling sessions.    Expected Outcomes Short Term: Participant demonstrates changes in health-related behavior, relaxation and other stress management skills, ability to obtain effective social support, and compliance with psychotropic medications if prescribed.;Long Term: Emotional wellbeing is indicated by absence of clinically significant psychosocial distress or social isolation.    Personal Goal Other Yes    Personal Goal Short: increase amount of walking, flexibility Long term: Strength and stability    Intervention Will continue to monitor pt and progress workloads as tolerated without sign or symptom    Expected Outcomes Pt will achieve her goals             Core Components/Risk Factors/Patient Goals Review:   Goals and Risk Factor Review     Row Name 09/07/22 1358 10/06/22 1700           Core Components/Risk Factors/Patient Goals Review   Personal Goals Review Weight Management/Obesity;Stress;Hypertension;Lipids;Diabetes Weight Management/Obesity;Stress;Hypertension;Lipids;Diabetes      Review Kent sNada Boozered intensive cardiac rehab on 09/07/22. Kent did well with exercise for his fitness level. Used a  rollator. Vital signs and CBG's were stable Ryan Duke is doing fair with exercise at intensive cardiac rehab for his fitness level. Ryan Duke remains somewhat deconditioned. Vital signs and CBG's were stable. kent ahs lost 1.1 kg since starting cardiac rehab      Expected Outcomes Ryan Duke will continue to participate in intensvie cardiac rehab for exercise, nutrition and lifestyle modifications. Ryan Duke will continue to participate in intensvie cardiac rehab for exercise, nutrition and lifestyle modifications.               Core Components/Risk Factors/Patient Goals at Discharge (Final Review):   Goals and Risk Factor Review - 10/06/22 1700       Core Components/Risk Factors/Patient Goals Review   Personal Goals Review Weight  Management/Obesity;Stress;Hypertension;Lipids;Diabetes    Review Ryan Duke is doing fair with exercise at intensive cardiac rehab for his fitness level. Ryan Duke remains somewhat deconditioned. Vital signs and CBG's were stable. kent ahs lost 1.1 kg since starting cardiac rehab    Expected Outcomes Ryan Duke will continue to participate in intensvie cardiac rehab for exercise, nutrition and lifestyle modifications.             ITP Comments:  ITP Comments     Row Name 09/03/22 1336 09/07/22 1350 10/06/22 1658 11/03/22 1414     ITP Comments Dr Fransico Him MD, Medical Director, Introduction to Pritikin Education Program/Intensive Cardiac rehab. Initial Orientation Packet Reviewed with the patient 30 Day ITP Review. Ryan Duke started cardiac rehab on 09/07/22 and did well with exercise for his fitness level as the patient is deconditioned. 30 Day ITP Review. Ryan Duke has good attendance and fair participation with exercise for his fitness level. 30 Day ITP Review. Ryan Duke has been out since 10/14/22 due to fall at home. Patient was cleared by his PCP to restart cardiac rehab this week but is still not feeling well and would like to restart on Monday, 11/09/22.             Comments: See ITP Comments

## 2022-11-05 DIAGNOSIS — M4726 Other spondylosis with radiculopathy, lumbar region: Secondary | ICD-10-CM | POA: Diagnosis not present

## 2022-11-05 DIAGNOSIS — Z6832 Body mass index (BMI) 32.0-32.9, adult: Secondary | ICD-10-CM | POA: Diagnosis not present

## 2022-11-05 DIAGNOSIS — M48062 Spinal stenosis, lumbar region with neurogenic claudication: Secondary | ICD-10-CM | POA: Diagnosis not present

## 2022-11-05 DIAGNOSIS — I10 Essential (primary) hypertension: Secondary | ICD-10-CM | POA: Diagnosis not present

## 2022-11-09 ENCOUNTER — Other Ambulatory Visit: Payer: Self-pay | Admitting: Internal Medicine

## 2022-11-09 ENCOUNTER — Ambulatory Visit (HOSPITAL_COMMUNITY): Payer: Medicare Other

## 2022-11-11 ENCOUNTER — Ambulatory Visit (HOSPITAL_COMMUNITY): Payer: Medicare Other

## 2022-11-13 ENCOUNTER — Ambulatory Visit (HOSPITAL_COMMUNITY): Payer: Medicare Other

## 2022-11-14 ENCOUNTER — Other Ambulatory Visit: Payer: Self-pay | Admitting: Internal Medicine

## 2022-11-14 DIAGNOSIS — N1831 Chronic kidney disease, stage 3a: Secondary | ICD-10-CM

## 2022-11-14 DIAGNOSIS — E118 Type 2 diabetes mellitus with unspecified complications: Secondary | ICD-10-CM

## 2022-11-16 ENCOUNTER — Ambulatory Visit (HOSPITAL_COMMUNITY): Payer: Medicare Other

## 2022-11-17 ENCOUNTER — Other Ambulatory Visit: Payer: Self-pay | Admitting: Internal Medicine

## 2022-11-18 ENCOUNTER — Ambulatory Visit (HOSPITAL_COMMUNITY): Payer: Medicare Other

## 2022-11-20 ENCOUNTER — Ambulatory Visit (HOSPITAL_COMMUNITY): Payer: Medicare Other

## 2022-11-20 ENCOUNTER — Encounter (HOSPITAL_COMMUNITY): Payer: Self-pay | Admitting: *Deleted

## 2022-11-20 DIAGNOSIS — Z952 Presence of prosthetic heart valve: Secondary | ICD-10-CM

## 2022-11-20 NOTE — Progress Notes (Signed)
Discharge Progress Report  Patient Details  Name: SELIM DURDEN MRN: [id number] Date of Birth: 1941/09/05 Referring Provider:   Flowsheet Row INTENSIVE CARDIAC REHAB ORIENT from 09/03/2022 in [facility]  Referring Provider Dr. Sherren Mocha MD        Number of Visits: 13 exercise and 12 education classes  Reason for Discharge:  Patient reached a stable level of exercise. Patient independent in their exercise. Patient has met program and personal goals.  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  ADL UCSD:   Initial Exercise Prescription:   Discharge Exercise Prescription (Final Exercise Prescription Changes):  Exercise Prescription Changes - 10/14/22 1025       Response to Exercise   Blood Pressure (Admit) 142/58    Blood Pressure (Exercise) 128/70    Blood Pressure (Exit) 104/68    Heart Rate (Admit) 59 bpm    Heart Rate (Exercise) 66 bpm    Heart Rate (Exit) 59 bpm    Rating of Perceived Exertion (Exercise) 9    Perceived Dyspnea (Exercise) 0    Symptoms None    Duration Progress to 30 minutes of  aerobic without signs/symptoms of physical distress    Intensity THRR unchanged      Progression   Progression Continue to progress workloads to maintain intensity without signs/symptoms of physical distress.    Average METs 1.7      Resistance Training   Training Prescription No   Relaxation day, no weights.     Interval Training   Interval Training No      NuStep   Level 3    SPM 69    Minutes 32    METs 1.7      Home Exercise Plan   Plans to continue exercise at Home (comment)   Walking   Frequency Add 1 additional day to program exercise sessions.    Initial Home Exercises Provided  09/21/22             Functional Capacity:   Psychological, QOL, Others - Outcomes: PHQ 2/9:    09/03/2022    1:47 PM 03/02/2022    2:55 PM 03/02/2022    2:53 PM 04/21/2021   11:00 AM 02/28/2020    2:24 PM  Depression screen PHQ 2/9  Decreased Interest 0 0 0 0 0  Down, Depressed, Hopeless 0 0 0 0 0  PHQ - 2 Score 0 0 0 0 0    Quality of Life:   Personal Goals: Goals established at orientation with interventions provided to work toward goal.    Personal Goals Discharge:  Goals and Risk Factor Review     Row Name 10/06/22 1700             Core Components/Risk Factors/Patient Goals Review   Personal Goals Review Weight Management/Obesity;Stress;Hypertension;Lipids;Diabetes       Review [name] is doing fair with exercise at intensive cardiac rehab for his fitness level. [name] remains somewhat deconditioned. Vital signs and CBG's were stable. kent ahs lost 1.1 kg since starting cardiac rehab       Expected Outcomes [name] will continue to participate in intensvie cardiac rehab for exercise, nutrition and lifestyle modifications.  Exercise Goals and Review:   Exercise Goals Re-Evaluation:  Exercise Goals Re-Evaluation     Row Name 09/21/22 1056 10/02/22 1057 11/02/22 0951         Exercise Goal Re-Evaluation   Exercise Goals Review Increase Physical Activity;Increase Strength and Stamina;Able to understand and use rate of perceived exertion (RPE) scale;Knowledge and understanding of Target Heart Rate Range (THRR);Understanding of Exercise Prescription Increase Physical Activity;Increase Strength and Stamina;Able to understand and use rate of perceived exertion (RPE) scale;Knowledge and understanding of Target Heart Rate Range (THRR);Understanding of Exercise Prescription --     Comments Reviewed exercise prescription with patient. Patient plans to walk as his mode of exercise. Discussed accumulating 30 minutes 1-2 days/week, and patient is amenable to this.  Patient making gradual progress with exercise. Patient doesn't feel he's where he needs to be with strength and stamina yet. Encouraged to increase pace and will increase level next week on the NuStep. Unable to review goals at this time. Patient has been absent from cardiac rehab since fall at home. Received clearance from patient's MD to resume exercise. Patient would like to restart next Monday 11/09/22.     Expected Outcomes Patient will walk 30 minutes 1-2 days/week in addition to exercise at cardiac rehab as tolerated. Continue to progress workloads as tolerated. Review goals upon return to exericse.              Nutrition & Weight - Outcomes:    Nutrition:  Nutrition Therapy & Goals - 11/02/22 0832       Nutrition Therapy   Diet Heart Healthy/Carbohydrate Consistent diet    Drug/Food Interactions Statins/Certain Fruits      Personal Nutrition Goals   Nutrition Goal Patient to identify strategies for managin cardiovascular risk by attending the weekly Pritikin education and nutrition series    Personal Goal #2 Patient to identify food sources and limit daily intake of saturated fat, trans fat, sodium, and refined carbohydrates    Personal Goal #3 Patient to limit to <1537m of sodium daily    Comments KNada Boozerhas not attended cardiac rehab or education since 10/14/22 due to fall at home; he is still awaiting medical clearance to return.      Intervention Plan   Intervention Prescribe, educate and counsel regarding individualized specific dietary modifications aiming towards targeted core components such as weight, hypertension, lipid management, diabetes, heart failure and other comorbidities.;Nutrition handout(s) given to patient.    Expected Outcomes Short Term Goal: Understand basic principles of dietary content, such as calories, fat, sodium, cholesterol and nutrients.;Long Term Goal: Adherence to prescribed nutrition plan.             Nutrition Discharge:   Education  Questionnaire Score:   KNada Boozerattended 13 exercise sessions between 09/03/22-10/14/22. Kent did fair with exercise as he is deconditioned. KNada Boozerdid get a RCorporate investment bankerto use while he participated in the program. KNada Boozerdid not return to cardiac rehab after he fell at home. KNada Boozerwould benefit from the balance and vestibular rehab, balance and fall prevention program. I gave the patient and his wife a brochure when he was still participating in the program.Kent did not return to complete the program although he had clearance from Dr JRonnald RampMHarrell GaveRN BSN

## 2022-12-09 DIAGNOSIS — M4726 Other spondylosis with radiculopathy, lumbar region: Secondary | ICD-10-CM | POA: Diagnosis not present

## 2022-12-09 DIAGNOSIS — R6889 Other general symptoms and signs: Secondary | ICD-10-CM | POA: Diagnosis not present

## 2022-12-09 DIAGNOSIS — R531 Weakness: Secondary | ICD-10-CM | POA: Diagnosis not present

## 2022-12-09 DIAGNOSIS — Z9181 History of falling: Secondary | ICD-10-CM | POA: Diagnosis not present

## 2022-12-09 DIAGNOSIS — Z7409 Other reduced mobility: Secondary | ICD-10-CM | POA: Diagnosis not present

## 2022-12-09 DIAGNOSIS — M48062 Spinal stenosis, lumbar region with neurogenic claudication: Secondary | ICD-10-CM | POA: Diagnosis not present

## 2022-12-16 DIAGNOSIS — R6889 Other general symptoms and signs: Secondary | ICD-10-CM | POA: Diagnosis not present

## 2022-12-16 DIAGNOSIS — R531 Weakness: Secondary | ICD-10-CM | POA: Diagnosis not present

## 2022-12-16 DIAGNOSIS — Z9181 History of falling: Secondary | ICD-10-CM | POA: Diagnosis not present

## 2022-12-16 DIAGNOSIS — Z7409 Other reduced mobility: Secondary | ICD-10-CM | POA: Diagnosis not present

## 2022-12-16 DIAGNOSIS — M4726 Other spondylosis with radiculopathy, lumbar region: Secondary | ICD-10-CM | POA: Diagnosis not present

## 2022-12-16 DIAGNOSIS — M48062 Spinal stenosis, lumbar region with neurogenic claudication: Secondary | ICD-10-CM | POA: Diagnosis not present

## 2022-12-17 ENCOUNTER — Encounter: Payer: Self-pay | Admitting: Internal Medicine

## 2022-12-17 ENCOUNTER — Ambulatory Visit (INDEPENDENT_AMBULATORY_CARE_PROVIDER_SITE_OTHER): Payer: Medicare Other | Admitting: Internal Medicine

## 2022-12-17 VITALS — BP 128/76 | HR 50 | Temp 98.2°F | Resp 16 | Ht 67.5 in | Wt 228.0 lb

## 2022-12-17 DIAGNOSIS — N1831 Chronic kidney disease, stage 3a: Secondary | ICD-10-CM | POA: Diagnosis not present

## 2022-12-17 DIAGNOSIS — I10 Essential (primary) hypertension: Secondary | ICD-10-CM | POA: Diagnosis not present

## 2022-12-17 DIAGNOSIS — E118 Type 2 diabetes mellitus with unspecified complications: Secondary | ICD-10-CM

## 2022-12-17 DIAGNOSIS — R001 Bradycardia, unspecified: Secondary | ICD-10-CM | POA: Diagnosis not present

## 2022-12-17 LAB — BASIC METABOLIC PANEL
BUN: 39 mg/dL — ABNORMAL HIGH (ref 6–23)
CO2: 29 mEq/L (ref 19–32)
Calcium: 9.4 mg/dL (ref 8.4–10.5)
Chloride: 101 mEq/L (ref 96–112)
Creatinine, Ser: 1.85 mg/dL — ABNORMAL HIGH (ref 0.40–1.50)
GFR: 33.2 mL/min — ABNORMAL LOW (ref >60.00)
Glucose, Bld: 148 mg/dL — ABNORMAL HIGH (ref 70–99)
Potassium: 4.1 mEq/L (ref 3.5–5.1)
Sodium: 139 mEq/L (ref 135–145)

## 2022-12-17 LAB — URINALYSIS, ROUTINE W REFLEX MICROSCOPIC
Bilirubin Urine: NEGATIVE
Ketones, ur: NEGATIVE
Leukocytes,Ua: NEGATIVE
Nitrite: NEGATIVE
Specific Gravity, Urine: 1.02 (ref 1.000–1.030)
Total Protein, Urine: 30 — AB
Urine Glucose: >=1000 — AB
Urobilinogen, UA: 0.2 (ref 0.0–1.0)
pH: 6 (ref 5.0–8.0)

## 2022-12-17 LAB — TSH: TSH: 2.33 u[IU]/mL (ref 0.35–5.50)

## 2022-12-17 LAB — CBC WITH DIFFERENTIAL/PLATELET
Basophils Absolute: 0.1 10*3/uL (ref 0.0–0.1)
Basophils Relative: 1.4 % (ref 0.0–3.0)
Eosinophils Absolute: 0.2 10*3/uL (ref 0.0–0.7)
Eosinophils Relative: 3.6 % (ref 0.0–5.0)
HCT: 43.2 % (ref 39.0–52.0)
Hemoglobin: 14.2 g/dL (ref 13.0–17.0)
Lymphocytes Relative: 31.3 % (ref 12.0–46.0)
Lymphs Abs: 1.9 10*3/uL (ref 0.7–4.0)
MCHC: 33 g/dL (ref 30.0–36.0)
MCV: 89.4 fl (ref 78.0–100.0)
Monocytes Absolute: 0.8 10*3/uL (ref 0.1–1.0)
Monocytes Relative: 13.1 % — ABNORMAL HIGH (ref 3.0–12.0)
Neutro Abs: 3 10*3/uL (ref 1.4–7.7)
Neutrophils Relative %: 50.6 % (ref 43.0–77.0)
Platelets: 203 10*3/uL (ref 150.0–400.0)
RBC: 4.83 Mil/uL (ref 4.22–5.81)
RDW: 14.2 % (ref 11.5–15.5)
WBC: 6 10*3/uL (ref 4.0–10.5)

## 2022-12-17 LAB — MICROALBUMIN / CREATININE URINE RATIO
Creatinine,U: 84 mg/dL
Microalb Creat Ratio: 33.7 mg/g — ABNORMAL HIGH (ref 0.0–30.0)
Microalb, Ur: 28.3 mg/dL — ABNORMAL HIGH (ref 0.0–1.9)

## 2022-12-17 LAB — HEMOGLOBIN A1C: Hgb A1c MFr Bld: 7.9 % — ABNORMAL HIGH (ref 4.6–6.5)

## 2022-12-17 MED ORDER — ATENOLOL 50 MG PO TABS: 50.0000 mg | ORAL_TABLET | Freq: Every day | ORAL | 0 refills | Status: DC

## 2022-12-17 NOTE — Patient Instructions (Signed)
Bradycardia, Adult Bradycardia is a slower-than-normal heartbeat. A normal resting heart rate for an adult ranges from 60 to 100 beats per minute. With bradycardia, the resting heart rate is less than 60 beats per minute. Bradycardia can prevent enough oxygen from reaching certain areas of your body when you are active. It can be serious if it keeps enough oxygen from reaching your brain and other parts of your body. Bradycardia is not a problem for everyone. For some healthy adults, a slow resting heart rate is normal. What are the causes? This condition may be caused by: A problem with the heart, including: A problem with the heart's electrical system, such as a heart block. With a heart block, electrical signals between the chambers of the heart are partially or completely blocked, so they are not able to work as they should. A problem with the heart's natural pacemaker (sinus node). Heart disease. A heart attack. Heart damage. Lyme disease. A heart infection. A heart condition that is present at birth (congenital heart defect). Certain medicines that treat heart conditions. Certain conditions, such as hypothyroidism and obstructive sleep apnea. Problems with the balance of chemicals and other substances, like potassium, in the blood. Trauma. Radiation therapy. What increases the risk? You are more likely to develop this condition if you: Are age 65 or older. Have high blood pressure (hypertension), high cholesterol (hyperlipidemia), or diabetes. Drink heavily, use tobacco or nicotine products, or use drugs. What are the signs or symptoms? Symptoms of this condition include: Light-headedness. Feeling faint or fainting. Fatigue and weakness. Trouble with activity or exercise. Shortness of breath. Chest pain (angina). Drowsiness. Confusion. Dizziness. How is this diagnosed? This condition may be diagnosed based on: Your symptoms. Your medical history. A physical exam. During  the exam, your health care provider will listen to your heartbeat and check your pulse. To confirm the diagnosis, your health care provider may order tests, such as: Blood tests. An electrocardiogram (ECG). This test records the heart's electrical activity. The test can show how fast your heart is beating and whether the heartbeat is steady. A test in which you wear a portable device (event recorder or Holter monitor) to record your heart's electrical activity while you go about your day. An exercise test. How is this treated? Treatment for this condition depends on the cause of the condition and how severe your symptoms are. Treatment may involve: Treatment of the underlying condition. Changing your medicines or how much medicine you take. Having a small, battery-operated device called a pacemaker implanted under the skin. When bradycardia occurs, this device can be used to increase your heart rate and help your heart beat in a regular rhythm. Follow these instructions at home: Lifestyle Manage any health conditions that contribute to bradycardia as told by your health care provider. Follow a heart-healthy diet. A nutrition specialist (dietitian) can help educate you about healthy food options and changes. Follow an exercise program that is approved by your health care provider. Maintain a healthy weight. Try to reduce or manage your stress, such as with yoga or meditation. If you need help reducing stress, ask your health care provider. Do not use any products that contain nicotine or tobacco. These products include cigarettes, chewing tobacco, and vaping devices, such as e-cigarettes. If you need help quitting, ask your health care provider. Do not use illegal drugs. Alcohol use If you drink alcohol: Limit how much you have to: 0-1 drink a day for women who are not pregnant. 0-2 drinks a day   for men. Know how much alcohol is in a drink. In the [place] one drink equals one 12 oz bottle of  beer (355 mL), one 5 oz glass of wine (148 mL), or one 1 oz glass of hard liquor (44 mL). General instructions Take over-the-counter and prescription medicines only as told by your health care provider. Keep all follow-up visits. This is important. How is this prevented? In some cases, bradycardia may be prevented by: Treating underlying medical problems. Stopping behaviors or medicines that can trigger the condition. Contact a health care provider if: You feel light-headed or dizzy. You almost faint. You feel weak or are easily fatigued during physical activity. You experience confusion or have memory problems. Get help right away if: You faint. You have chest pains or an irregular heartbeat (palpitations). You have trouble breathing. These symptoms may represent a serious problem that is an emergency. Do not wait to see if the symptoms will go away. Get medical help right away. Call your local emergency services (911 in the [place] Do not drive yourself to the hospital. Summary Bradycardia is a slower-than-normal heartbeat. With bradycardia, the resting heart rate is less than 60 beats per minute. Treatment for this condition depends on the cause. Manage any health conditions that contribute to bradycardia as told by your health care provider. Do not use any products that contain nicotine or tobacco. These products include cigarettes, chewing tobacco, and vaping devices, such as e-cigarettes. Keep all follow-up visits. This is important. This information is not intended to replace advice given to you by your health care provider. Make sure you discuss any questions you have with your health care provider. Document Revised: 03/09/2021 Document Reviewed: 03/09/2021 Elsevier Patient Education  2023 Elsevier Inc.  

## 2022-12-17 NOTE — Progress Notes (Unsigned)
Subjective:  Patient ID: Ryan Duke, male    DOB: Dec 23, 1940  Age: 82 y.o. MRN: [id number]  CC: Hypertension, Diabetes, and Osteoarthritis   HPI Ryan Duke presents for f/up -  He complains of dizziness but denies palpitations, syncope, or near syncope.  Outpatient Medications Prior to Visit  Medication Sig Dispense Refill   amoxicillin (AMOXIL) 500 MG tablet Take 4 tablets (2,000 mg total) by mouth as directed. 1 hour prior to dental work including cleanings 12 tablet 12   aspirin 81 MG chewable tablet Chew 1 tablet (81 mg total) by mouth daily. 90 tablet 3   clopidogrel (PLAVIX) 75 MG tablet Take 1 tablet (75 mg total) by mouth daily with breakfast. 90 tablet 1   Continuous Blood Gluc Receiver (FREESTYLE LIBRE 2 READER) DEVI USE AS DIRECTED. 2 each 5   Continuous Blood Gluc Sensor (FREESTYLE LIBRE 2 SENSOR) MISC USE AS DIRECTED AND REPLACE EVERY 14 DAYS. 2 each 5   DULoxetine (CYMBALTA) 60 MG capsule TAKE ONE CAPSULE DAILY 90 capsule 1   fluticasone (FLONASE) 50 MCG/ACT nasal spray Place 1 spray into both nostrils daily as needed for allergies or rhinitis.     Glucagon (GVOKE HYPOPEN 2-PACK) 1 MG/0.2ML SOAJ Inject 1 Act into the skin daily as needed. 2 mL 5   HYDROcodone-acetaminophen (NORCO) 10-325 MG tablet Take 1 tablet by mouth every 8 (eight) hours as needed. 45 tablet 0   Insulin Glargine-Lixisenatide (SOLIQUA) 100-33 UNT-MCG/ML SOPN Inject 50 Units into the skin daily. 54 mL 1   Insulin Pen Needle (NOVOFINE) 32G X 6 MM MISC 1 Act by Does not apply route daily. 100 each 1   JARDIANCE 25 MG TABS tablet TAKE ONE TABLET BY MOUTH ONCE DAILY BEFORE BREAKFAST 90 tablet 1   KERENDIA 20 MG TABS Take 1 tablet by mouth daily. 90 tablet 0   losartan (COZAAR) 100 MG tablet Take 1 tablet (100 mg total) by mouth daily for high blood pressure 90 tablet 1   Multiple Vitamin (MULTIVITAMIN WITH MINERALS) TABS tablet Take 1 tablet by mouth daily.     Multiple Vitamins-Minerals  (PRESERVISION AREDS PO) Take 1 tablet by mouth in the morning and at bedtime.     MYRBETRIQ 50 MG TB24 tablet TAKE 1 TABLET ONCE DAILY. 90 tablet 1   pravastatin (PRAVACHOL) 40 MG tablet TAKE ONE TABLET BY MOUTH ONCE DAILY 90 tablet 0   pregabalin (LYRICA) 150 MG capsule Take 150 mg by mouth See admin instructions. Take 150 mg daily, may take an additional 150 mg twice daily as needed for pain     vitamin B-12 (CYANOCOBALAMIN) 500 MCG tablet Take 500 mcg by mouth daily.     atenolol-chlorthalidone (TENORETIC) 100-25 MG tablet TAKE ONE TABLET BY MOUTH ONCE DAILY 90 tablet 0   tamsulosin (FLOMAX) 0.4 MG CAPS capsule TAKE ONE CAPSULE BY MOUTH DAILY 90 capsule 1   No facility-administered medications prior to visit.    ROS Review of Systems  Constitutional:  Positive for fatigue. Negative for chills, diaphoresis and fever.  HENT: Negative.    Eyes: Negative.   Respiratory:  Negative for cough, chest tightness, shortness of breath and wheezing.   Cardiovascular:  Negative for chest pain, palpitations and leg swelling.  Gastrointestinal:  Negative for abdominal pain, diarrhea, nausea and vomiting.  Endocrine: Negative.   Genitourinary: Negative.  Negative for decreased urine volume, difficulty urinating, dysuria, frequency and urgency.  Musculoskeletal:  Positive for arthralgias.  Skin:  Negative for color change.  Neurological:  Positive for dizziness. Negative for syncope, weakness and light-headedness.  Hematological:  Negative for adenopathy. Does not bruise/bleed easily.  Psychiatric/Behavioral: Negative.      Objective:  BP 128/76 (BP Location: Right Arm, Patient Position: Sitting, Cuff Size: Large)   Pulse (!) 50   Temp 98.2 F (36.8 C) (Oral)   Resp 16   Ht 5' 7.5" (1.715 m)   Wt 228 lb (103.4 kg)   SpO2 93%   BMI 35.18 kg/m   BP Readings from Last 3 Encounters:  12/17/22 128/76  10/19/22 132/76  09/15/22 (!) 142/68    Wt Readings from Last 3 Encounters:  12/17/22 228  lb (103.4 kg)  10/19/22 227 lb (103 kg)  09/15/22 233 lb (105.7 kg)    Physical Exam Vitals reviewed.  HENT:     Mouth/Throat:     Mouth: Mucous membranes are moist.  Eyes:     Conjunctiva/sclera: Conjunctivae normal.  Cardiovascular:     Rate and Rhythm: Regular rhythm. Bradycardia present. Occasional Extrasystoles are present.    Heart sounds: Murmur heard.     Systolic murmur is present with a grade of 2/6.     No diastolic murmur is present.     No gallop.     Comments: EKG- SB with PAC's, 56 bpm Bradycardia is new LAD Incomplete RBBB Minimal LVH Pulmonary:     Effort: Pulmonary effort is normal.     Breath sounds: Normal breath sounds. No stridor. No wheezing, rhonchi or rales.  Abdominal:     General: Abdomen is protuberant. Bowel sounds are normal. There is no distension.     Palpations: There is no hepatomegaly, splenomegaly or mass.     Tenderness: There is no abdominal tenderness.  Musculoskeletal:     Cervical back: Neck supple.     Right lower leg: No edema.     Left lower leg: No edema.  Skin:    General: Skin is warm and dry.  Neurological:     General: No focal deficit present.     Mental Status: He is alert.  Psychiatric:        Mood and Affect: Mood normal.        Behavior: Behavior normal.     Lab Results  Component Value Date   WBC 6.0 12/17/2022   HGB 14.2 12/17/2022   HCT 43.2 12/17/2022   PLT 203.0 12/17/2022   GLUCOSE 148 (H) 12/17/2022   CHOL 152 12/04/2021   TRIG 96.0 12/04/2021   HDL 40.20 12/04/2021   LDLDIRECT 81.0 05/26/2018   LDLCALC 93 12/04/2021   ALT 16 07/10/2022   AST 18 07/10/2022   NA 139 12/17/2022   K 4.1 12/17/2022   CL 101 12/17/2022   CREATININE 1.85 (H) 12/17/2022   BUN 39 (H) 12/17/2022   CO2 29 12/17/2022   TSH 2.33 12/17/2022   PSA 1.05 10/28/2015   INR 1.0 07/10/2022   HGBA1C 7.9 (H) 12/17/2022   MICROALBUR 28.3 (H) 12/17/2022    No results found.  Assessment & Plan:   Ryan Duke was seen today  for hypertension, diabetes and osteoarthritis.  Diagnoses and all orders for this visit:  Essential hypertension- His blood pressure is overcontrolled and he has had decline in his renal function and he is bradycardic.  Will taper him off of atenolol and will discontinue the thiazide diuretic. -     atenolol (TENORMIN) 50 MG tablet; Take 1 tablet (50 mg total) by mouth daily. -     TSH;  Future -     Urinalysis, Routine w reflex microscopic; Future -     CBC with Differential/Platelet; Future -     Basic metabolic panel; Future -     Basic metabolic panel -     CBC with Differential/Platelet -     Urinalysis, Routine w reflex microscopic -     TSH  Type II diabetes mellitus with manifestations ([place])- His blood sugar is adequately well-controlled. -     Hemoglobin A1c; Future -     Basic metabolic panel; Future -     Microalbumin / creatinine urine ratio; Future -     Microalbumin / creatinine urine ratio -     Basic metabolic panel -     Hemoglobin A1c  Bradycardia- Labs are negative for secondary causes.  I will taper off of the beta-blocker. -     TSH; Future -     Basic metabolic panel; Future -     EKG 12-Lead -     Basic metabolic panel -     TSH  Stage 3a chronic kidney disease ([place])- Will discontinue the thiazide diuretic.   I have discontinued [name]. [place]'s atenolol-chlorthalidone. I am also having him start on atenolol. Additionally, I am having him maintain his multivitamin with minerals, Multiple Vitamins-Minerals (PRESERVISION AREDS PO), cyanocobalamin, pregabalin, NovoFine, Myrbetriq, fluticasone, aspirin, clopidogrel, amoxicillin, FreeStyle Libre 2 Reader, [place], [place] HypoPen 2-Pack, FreeStyle [place] 2 Sensor, losartan, Jardiance, HYDROcodone-acetaminophen, DULoxetine, Kerendia, and pravastatin.  Meds ordered this encounter  Medications   atenolol (TENORMIN) 50 MG tablet    Sig: Take 1 tablet (50 mg total) by mouth daily.    Dispense:  30 tablet    Refill:   0     Follow-up: Return in about 3 months (around 03/18/2023).  [name], MD

## 2022-12-20 ENCOUNTER — Other Ambulatory Visit: Payer: Self-pay | Admitting: Internal Medicine

## 2022-12-23 ENCOUNTER — Encounter: Payer: Self-pay | Admitting: Internal Medicine

## 2022-12-23 ENCOUNTER — Telehealth: Payer: Self-pay | Admitting: Internal Medicine

## 2022-12-23 NOTE — Telephone Encounter (Signed)
Patient called and states that he is returning your call.  Please call him at [phone]

## 2022-12-23 NOTE — Telephone Encounter (Signed)
I have not reached out to this pt.

## 2023-01-11 ENCOUNTER — Other Ambulatory Visit: Payer: Self-pay | Admitting: Internal Medicine

## 2023-01-11 DIAGNOSIS — E118 Type 2 diabetes mellitus with unspecified complications: Secondary | ICD-10-CM

## 2023-01-11 DIAGNOSIS — I10 Essential (primary) hypertension: Secondary | ICD-10-CM

## 2023-01-19 ENCOUNTER — Ambulatory Visit: Payer: Medicare Other | Attending: Cardiovascular Disease | Admitting: Cardiovascular Disease

## 2023-01-19 ENCOUNTER — Encounter: Payer: Self-pay | Admitting: Cardiovascular Disease

## 2023-01-19 VITALS — BP 130/80 | HR 48 | Ht 71.0 in | Wt 231.8 lb

## 2023-01-19 DIAGNOSIS — E782 Mixed hyperlipidemia: Secondary | ICD-10-CM | POA: Diagnosis not present

## 2023-01-19 DIAGNOSIS — I10 Essential (primary) hypertension: Secondary | ICD-10-CM | POA: Diagnosis not present

## 2023-01-19 DIAGNOSIS — R911 Solitary pulmonary nodule: Secondary | ICD-10-CM | POA: Diagnosis not present

## 2023-01-19 DIAGNOSIS — Z953 Presence of xenogenic heart valve: Secondary | ICD-10-CM | POA: Diagnosis not present

## 2023-01-19 NOTE — Progress Notes (Signed)
Cardiology Office Note:    Date:  01/19/2023   ID:  Ryan Duke, DOB 1940/12/10, MRN [place]:[id number]  PCP:  Janith Lima, MD   [place] Providers Cardiologist:  Sherren Mocha, MD     Referring MD: Janith Lima, MD   Chief Complaint  Patient presents with   Aortic Stenosis    History of Present Illness:    Ryan Duke is a 82 y.o. 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  The patient is here with his wife today.  He was last seen by Nell Range here on August 19, 2022 at which time he was doing well.  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  2 transcatheter valves were placed because the initial valve was canted with significant aortic insufficiency.  During his TAVR evaluation, there were 2 incidental findings noted.  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  Radiology wrote that an MRI of the abdomen without and with IV contrast could be considered for further evaluation.  There are also a few scattered pulmonary nodules with recommendations for a noncontrast chest CT at 26-monthinterval.  This will be set up at the patient's 1 year follow-up evaluation this coming summer.  The patient is here with his wife today.  He has been doing fine from a cardiac perspective.  He is short of breath with climbing a flight of stairs, but this has not changed over time.  This is a lot of work for him as he has some trouble with gait instability.  He walks with either a cane or rolling walker.  He states that overall his breathing has improved since he underwent TAVR.  He has not had chest pain, chest pressure, edema, heart palpitations, or lightheadedness.  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 CoBurt Knacknd Dr. BaCyndia Bent Severe aortic stenosis     Past Surgical History:  Procedure Laterality Date   ALVEOLOPLASTY N/A 05/14/2022   Procedure: ALVEOLOPLASTY;  Surgeon: OwCharlaine DaltonDMD;  [facility] Service: Dentistry;  Laterality: N/A;   CARDIAC CATHETERIZATION     EYE SURGERY  2021   cataracts   FLEXIBLE SIGMOIDOSCOPY N/A 09/10/2017   Procedure: FLEXIBLE SIGMOIDOSCOPY;  Surgeon: NaMauri PoleMD;  Location: [facility];  Service: Endoscopy;  Laterality: N/A;   IR KYPHO LUMBAR INC FX REDUCE BONE BX UNI/BIL CANNULATION INC/IMAGING  09/14/2017   IR RADIOLOGIST EVAL & MGMT  10/07/2017   RIGHT HEART CATH AND CORONARY ANGIOGRAPHY N/A 05/07/2022   Procedure: RIGHT HEART CATH AND CORONARY ANGIOGRAPHY;  Surgeon: CoSherren MochaMD;  [facility];  Service: Cardiovascular;  Laterality: N/A;   TEE WITHOUT CARDIOVERSION N/A 07/14/2022   Procedure: TRANSESOPHAGEAL ECHOCARDIOGRAM (TEE);  Surgeon: CoSherren MochaMD;  [facility];  Service: Open Heart Surgery;  Laterality: N/A;   TONSILLECTOMY     TOOTH EXTRACTION  N/A 05/14/2022   Procedure: DENTAL EXTRACTIONS TEETH NUMBER TWO, THREE, FOUR, FIVE, FOURTEEN, FIFTEEN, TWENTY;  Surgeon: Charlaine Dalton, DMD;  Location: [place];  Service: Dentistry;  Laterality: N/A;    Current Medications: Current Meds  Medication Sig   amoxicillin (AMOXIL) 500 MG tablet Take 4 tablets (2,000 mg total) by mouth as directed. 1 hour prior to dental work including cleanings   aspirin 81 MG chewable tablet Chew 1 tablet (81 mg total) by mouth daily.   atenolol (TENORMIN) 50 MG tablet Take 1 tablet (50 mg total) by mouth daily.   Continuous Blood Gluc Receiver (FREESTYLE LIBRE 2 READER) DEVI USE AS  DIRECTED.   Continuous Blood Gluc Sensor (FREESTYLE LIBRE 2 SENSOR) MISC USE AS DIRECTED AND REPLACE EVERY 14 DAYS.   DULoxetine (CYMBALTA) 60 MG capsule TAKE ONE CAPSULE DAILY   fluticasone (FLONASE) 50 MCG/ACT nasal spray Place 1 spray into both nostrils daily as needed for allergies or rhinitis.   Insulin Glargine-Lixisenatide (SOLIQUA) 100-33 UNT-MCG/ML SOPN Inject 50 Units into the skin daily.   Insulin Pen Needle (NOVOFINE) 32G X 6 MM MISC 1 Act by Does not apply route daily.   JARDIANCE 25 MG TABS tablet TAKE ONE TABLET BY MOUTH ONCE DAILY BEFORE BREAKFAST   KERENDIA 20 MG TABS Take 1 tablet by mouth daily.   losartan (COZAAR) 100 MG tablet Take 1 tablet (100 mg total) by mouth daily for high blood pressure   Multiple Vitamin (MULTIVITAMIN WITH MINERALS) TABS tablet Take 1 tablet by mouth daily.   Multiple Vitamins-Minerals (PRESERVISION AREDS PO) Take 1 tablet by mouth in the morning and at bedtime.   MYRBETRIQ 50 MG TB24 tablet TAKE 1 TABLET ONCE DAILY.   pravastatin (PRAVACHOL) 40 MG tablet TAKE ONE TABLET BY MOUTH ONCE DAILY   pregabalin (LYRICA) 150 MG capsule Take 150 mg by mouth See admin instructions. Take 150 mg daily, may take an additional 150 mg twice daily as needed for pain   tamsulosin (FLOMAX) 0.4 MG CAPS capsule TAKE ONE CAPSULE BY MOUTH DAILY   vitamin B-12 (CYANOCOBALAMIN) 500 MCG tablet Take 500 mcg by mouth daily.   [DISCONTINUED] clopidogrel (PLAVIX) 75 MG tablet Take 1 tablet (75 mg total) by mouth daily with breakfast.   [DISCONTINUED] Glucagon (GVOKE HYPOPEN 2-PACK) 1 MG/0.2ML SOAJ Inject 1 Act into the skin daily as needed.   [DISCONTINUED] HYDROcodone-acetaminophen (NORCO) 10-325 MG tablet Take 1 tablet by mouth every 8 (eight) hours as needed.     Allergies:   Farxiga [dapagliflozin], Metformin and related, Atorvastatin, Benazepril hcl, Flexeril [cyclobenzaprine], and Oxycodone   Social History   Socioeconomic History   Marital status: Married    Spouse  name: Not on file   Number of children: Not on file   Years of education: 14   Highest education level: Some college, no degree  Occupational History   Occupation: retired  Tobacco Use   Smoking status: Former    Types: Cigarettes    Quit date: 11/30/1974    Years since quitting: 48.1    Passive exposure: Never   Smokeless tobacco: Never  Vaping Use   Vaping Use: Never used  Substance and Sexual Activity   Alcohol use: Not Currently    Alcohol/week: 0.0 standard drinks of alcohol   Drug use: No   Sexual activity: Yes    Partners: Female  Other Topics Concern   Not on file  Social History Narrative   Not on file   Social Determinants of Health  Financial Resource Strain: Low Risk  (03/02/2022)   Overall Financial Resource Strain (CARDIA)    Difficulty of Paying Living Expenses: Not hard at all  Food Insecurity: No Food Insecurity (03/02/2022)   Hunger Vital Sign    Worried About Running Out of Food in the Last Year: Never true    Ran Out of Food in the Last Year: Never true  Transportation Needs: No Transportation Needs (03/02/2022)   PRAPARE - Hydrologist (Medical): No    Lack of Transportation (Non-Medical): No  Physical Activity: Insufficiently Active (03/02/2022)   Exercise Vital Sign    Days of Exercise per Week: 2 days    Minutes of Exercise per Session: 20 min  Stress: No Stress Concern Present (03/02/2022)   [place]    Feeling of Stress : Not at all  Social Connections: Moderately Isolated (03/02/2022)   Social Connection and Isolation Panel [NHANES]    Frequency of Communication with Friends and Family: Twice a week    Frequency of Social Gatherings with Friends and Family: Twice a week    Attends Religious Services: Never    Marine scientist or Organizations: No    Attends Music therapist: Never    Marital Status: Married     Family History: The  patient's family history includes Hypertension in an other family member.  ROS:   Please see the history of present illness.    All other systems reviewed and are negative.  EKGs/Labs/Other Studies Reviewed:    The following studies were reviewed today: Echo 08/19/2022: 1. Left ventricular ejection fraction, by estimation, is 55 to 60%. The  left ventricle has normal function. The left ventricle has no regional  wall motion abnormalities. There is mild left ventricular hypertrophy.  Left ventricular diastolic parameters  are consistent with Grade I diastolic dysfunction (impaired relaxation).   2. Right ventricular systolic function is normal. The right ventricular  size is normal. Tricuspid regurgitation signal is inadequate for assessing  PA pressure.   3. The mitral valve is normal in structure. No evidence of mitral valve  regurgitation. No evidence of mitral stenosis.   4. Bioprosthetic aortic valve s/p TAVR. 26 mm Edwards Sapien 3 Ultra  Resilia THV. Mean gradient 9 mmHg, EOA 1.94 cm^2. Mild perivalvular  regurgitation.   5. Aortic dilatation noted. There is mild dilatation of the ascending  aorta, measuring 39 mm.   6. The inferior vena cava is normal in size with greater than 50%  respiratory variability, suggesting right atrial pressure of 3 mmHg.   EKG:  EKG is not ordered today.    Recent Labs: 07/10/2022: ALT 16 07/15/2022: Magnesium 2.0 12/17/2022: BUN 39; Creatinine, Ser 1.85; Hemoglobin 14.2; Platelets 203.0; Potassium 4.1; Sodium 139; TSH 2.33  Recent Lipid Panel    Component Value Date/Time   CHOL 152 12/04/2021 1041   TRIG 96.0 12/04/2021 1041   TRIG 171 (H) 10/06/2006 0741   HDL 40.20 12/04/2021 1041   CHOLHDL 4 12/04/2021 1041   VLDL 19.2 12/04/2021 1041   LDLCALC 93 12/04/2021 1041   LDLDIRECT 81.0 05/26/2018 1547     Risk Assessment/Calculations:                Physical Exam:    VS:  BP 130/80   Pulse (!) 48   Ht 5' 11"$  (1.803 m)   Wt 231  lb 12.8 oz (105.1 kg)   SpO2 97%  BMI 32.33 kg/m     Wt Readings from Last 3 Encounters:  01/19/23 231 lb 12.8 oz (105.1 kg)  12/17/22 228 lb (103.4 kg)  10/19/22 227 lb (103 kg)     GEN:  Well nourished, well developed in no acute distress HEENT: Normal NECK: No JVD; No carotid bruits LYMPHATICS: No lymphadenopathy CARDIAC: RRR, 2/6 early peaking systolic ejection murmur at the right upper sternal border, no diastolic murmur RESPIRATORY:  Clear to auscultation without rales, wheezing or rhonchi  ABDOMEN: Soft, non-tender, non-distended MUSCULOSKELETAL:  No edema; No deformity  SKIN: Warm and dry NEUROLOGIC:  Alert and oriented x 3 PSYCHIATRIC:  Normal affect   ASSESSMENT:    1. S/p TAVR (transcatheter aortic valve replacement), bioprosthetic   2. Essential hypertension   3. Mixed hyperlipidemia   4. Pulmonary nodule    PLAN:    In order of problems listed above:  Patient doing well with NYHA functional class I-II symptoms of dyspnea with exertion.  No evidence of volume overload on exam.  He had an echocardiogram last year for his 30-day TAVR follow-up that showed normal function of his aortic bioprosthesis with mild paravalvular aortic insufficiency.  He will be due for a follow-up echocardiogram and office visit in August for his 1 year studies.  He does follow SBE prophylaxis as directed with amoxicillin prior to dental work. His blood pressure was initially elevated after a long walk into the office.  Repeat blood pressure is 130/80 mmHg.  He will continue on atenolol, Jardiance, and losartan. Treated with pravastatin.  Has some intolerance to high intensity statin drugs.  Managed by his PCP. Incidental pulmonary nodules noticed on pre-TAVR CTA studies.  Reviewed notes and there are plans to order a repeat noncontrast CT in 1 year.  This will be arranged at his 1 year TAVR follow-up visit in August.           Medication Adjustments/Labs and Tests Ordered: Current  medicines are reviewed at length with the patient today.  Concerns regarding medicines are outlined above.  No orders of the defined types were placed in this encounter.  No orders of the defined types were placed in this encounter.   Patient Instructions  Medication Instructions:  Your physician recommends that you continue on your current medications as directed. Please refer to the Current Medication list given to you today.  *If you need a refill on your cardiac medications before your next appointment, please call your pharmacy*   Lab Work: NONE If you have labs (blood work) drawn today and your tests are completely normal, you will receive your results only by: [place] (if you have MyChart) OR A paper copy in the mail If you have any lab test that is abnormal or we need to change your treatment, we will call you to review the results.   Testing/Procedures: NONE   Follow-Up: At [facility], you and your health needs are our priority.  As part of our continuing mission to provide you with exceptional heart care, we have created designated Provider Care Teams.  These Care Teams include your primary Cardiologist (physician) and Advanced Practice Providers (APPs -  Physician Assistants and Nurse Practitioners) who all work together to provide you with the care you need, when you need it.  We recommend signing up for the patient portal called "MyChart".  Sign up information is provided on this After Visit Summary.  MyChart is used to connect with patients for Virtual Visits (Telemedicine).  Patients  are able to view lab/test results, encounter notes, upcoming appointments, etc.  Non-urgent messages can be sent to your provider as well.   To learn more about what you can do with MyChart, go to [email or web address].    Your next appointment:   1 year(s)  Provider:   Sherren Mocha, MD        Signed, Sherren Mocha, MD  01/19/2023 1:10 PM    [place]

## 2023-01-19 NOTE — Patient Instructions (Signed)
Medication Instructions:  Your physician recommends that you continue on your current medications as directed. Please refer to the Current Medication list given to you today.  *If you need a refill on your cardiac medications before your next appointment, please call your pharmacy*   Lab Work: NONE If you have labs (blood work) drawn today and your tests are completely normal, you will receive your results only by: [place] (if you have MyChart) OR A paper copy in the mail If you have any lab test that is abnormal or we need to change your treatment, we will call you to review the results.   Testing/Procedures: NONE   Follow-Up: At [facility], you and your health needs are our priority.  As part of our continuing mission to provide you with exceptional heart care, we have created designated Provider Care Teams.  These Care Teams include your primary Cardiologist (physician) and Advanced Practice Providers (APPs -  Physician Assistants and Nurse Practitioners) who all work together to provide you with the care you need, when you need it.  We recommend signing up for the patient portal called "MyChart".  Sign up information is provided on this After Visit Summary.  MyChart is used to connect with patients for Virtual Visits (Telemedicine).  Patients are able to view lab/test results, encounter notes, upcoming appointments, etc.  Non-urgent messages can be sent to your provider as well.   To learn more about what you can do with MyChart, go to [email or web address].    Your next appointment:   1 year(s)  Provider:   Sherren Mocha, MD

## 2023-02-09 ENCOUNTER — Telehealth: Payer: Self-pay | Admitting: Internal Medicine

## 2023-02-09 NOTE — Telephone Encounter (Signed)
Patient called and states that his insurance company will no longer cover Carrington Clamp, he would like to know what Dr. Ronnald Ramp wants to replace this with.  He also states that his injectable is no longer available per his pharmacy.  Please call patient at [phone]

## 2023-02-10 ENCOUNTER — Other Ambulatory Visit: Payer: Self-pay | Admitting: Internal Medicine

## 2023-02-10 DIAGNOSIS — Z794 Long term (current) use of insulin: Secondary | ICD-10-CM

## 2023-02-10 MED ORDER — TOUJEO MAX SOLOSTAR 300 UNIT/ML ~~LOC~~ SOPN: 30.0000 [IU] | PEN_INJECTOR | Freq: Every day | SUBCUTANEOUS | 1 refills | Status: DC

## 2023-02-10 MED ORDER — LANTUS SOLOSTAR 100 UNIT/ML ~~LOC~~ SOPN: 30.0000 [IU] | PEN_INJECTOR | Freq: Every day | SUBCUTANEOUS | 0 refills | Status: DC

## 2023-02-15 ENCOUNTER — Other Ambulatory Visit: Payer: Self-pay | Admitting: Internal Medicine

## 2023-02-15 DIAGNOSIS — E118 Type 2 diabetes mellitus with unspecified complications: Secondary | ICD-10-CM

## 2023-02-15 DIAGNOSIS — N1831 Chronic kidney disease, stage 3a: Secondary | ICD-10-CM

## 2023-02-18 ENCOUNTER — Other Ambulatory Visit: Payer: Self-pay | Admitting: Internal Medicine

## 2023-02-18 DIAGNOSIS — I10 Essential (primary) hypertension: Secondary | ICD-10-CM

## 2023-02-19 ENCOUNTER — Telehealth: Payer: Self-pay | Admitting: Internal Medicine

## 2023-02-19 NOTE — Telephone Encounter (Signed)
FYI:  Asa Lente has been approved by [facility]

## 2023-02-23 ENCOUNTER — Other Ambulatory Visit: Payer: Self-pay | Admitting: Internal Medicine

## 2023-02-25 ENCOUNTER — Encounter: Payer: Self-pay | Admitting: Internal Medicine

## 2023-03-02 ENCOUNTER — Telehealth: Payer: Self-pay

## 2023-03-02 NOTE — Telephone Encounter (Signed)
Called patient to schedule Medicare Annual Wellness Visit (AWV). Unable to reach patient.  Last date of AWV: 03/02/22  Please schedule an appointment at any time with [facility].    Norton Blizzard, CMA (AAMA)  [facility]

## 2023-03-04 ENCOUNTER — Ambulatory Visit (INDEPENDENT_AMBULATORY_CARE_PROVIDER_SITE_OTHER): Payer: Medicare Other | Admitting: Internal Medicine

## 2023-03-04 ENCOUNTER — Encounter: Payer: Self-pay | Admitting: Internal Medicine

## 2023-03-04 VITALS — BP 146/68 | HR 63 | Temp 97.9°F | Resp 16 | Ht 71.0 in | Wt 226.0 lb

## 2023-03-04 DIAGNOSIS — I10 Essential (primary) hypertension: Secondary | ICD-10-CM | POA: Diagnosis not present

## 2023-03-04 DIAGNOSIS — R001 Bradycardia, unspecified: Secondary | ICD-10-CM | POA: Diagnosis not present

## 2023-03-04 DIAGNOSIS — R270 Ataxia, unspecified: Secondary | ICD-10-CM | POA: Diagnosis not present

## 2023-03-04 NOTE — Progress Notes (Signed)
Subjective:  Patient ID: HURL RO, male    DOB: 03-26-1941  Age: 82 y.o. MRN: [id number]  CC: Hypertension and Diabetes   HPI Ryan Duke presents for f/up --  He complains of a 1 week history of ataxia and falls with no injury.  The symptoms started gradually.  He complains of urinary frequency and incontinence, fatigue, and diffuse weakness.  Outpatient Medications Prior to Visit  Medication Sig Dispense Refill   amoxicillin (AMOXIL) 500 MG tablet Take 4 tablets (2,000 mg total) by mouth as directed. 1 hour prior to dental work including cleanings 12 tablet 12   aspirin 81 MG chewable tablet Chew 1 tablet (81 mg total) by mouth daily. 90 tablet 3   atenolol (TENORMIN) 50 MG tablet Take 1 tablet (50 mg total) by mouth daily. 30 tablet 0   Continuous Blood Gluc Receiver (FREESTYLE LIBRE 2 READER) DEVI USE AS DIRECTED. 2 each 5   Continuous Blood Gluc Sensor (FREESTYLE LIBRE 2 SENSOR) MISC USE AS DIRECTED AND REPLACE EVERY 14 DAYS. 2 each 5   DULoxetine (CYMBALTA) 60 MG capsule TAKE ONE CAPSULE DAILY 90 capsule 1   fluticasone (FLONASE) 50 MCG/ACT nasal spray Place 1 spray into both nostrils daily as needed for allergies or rhinitis.     insulin glargine (LANTUS SOLOSTAR) 100 UNIT/ML Solostar Pen Inject 30 Units into the skin daily. 27 mL 0   Insulin Pen Needle (NOVOFINE) 32G X 6 MM MISC 1 Act by Does not apply route daily. 100 each 1   JARDIANCE 25 MG TABS tablet TAKE ONE TABLET BY MOUTH ONCE DAILY BEFORE BREAKFAST 90 tablet 0   KERENDIA 20 MG TABS Take 1 tablet by mouth daily. 90 tablet 0   losartan (COZAAR) 100 MG tablet Take 1 tablet (100 mg total) by mouth daily for high blood pressure 90 tablet 1   Multiple Vitamin (MULTIVITAMIN WITH MINERALS) TABS tablet Take 1 tablet by mouth daily.     Multiple Vitamins-Minerals (PRESERVISION AREDS PO) Take 1 tablet by mouth in the morning and at bedtime.     MYRBETRIQ 50 MG TB24 tablet TAKE 1 TABLET ONCE DAILY. 90 tablet 1    pravastatin (PRAVACHOL) 40 MG tablet TAKE ONE TABLET BY MOUTH ONCE DAILY 90 tablet 0   pregabalin (LYRICA) 150 MG capsule Take 150 mg by mouth See admin instructions. Take 150 mg daily, may take an additional 150 mg twice daily as needed for pain     tamsulosin (FLOMAX) 0.4 MG CAPS capsule TAKE ONE CAPSULE BY MOUTH DAILY 90 capsule 1   vitamin B-12 (CYANOCOBALAMIN) 500 MCG tablet Take 500 mcg by mouth daily.     No facility-administered medications prior to visit.    ROS Review of Systems  Constitutional:  Positive for fatigue. Negative for chills, diaphoresis and unexpected weight change.  HENT: Negative.    Eyes: Negative.   Respiratory:  Negative for cough, chest tightness, shortness of breath and wheezing.   Cardiovascular:  Negative for chest pain, palpitations and leg swelling.  Gastrointestinal:  Negative for abdominal pain, diarrhea, nausea and vomiting.  Endocrine: Positive for polyuria.  Genitourinary:  Positive for frequency. Negative for difficulty urinating.  Musculoskeletal:  Positive for gait problem. Negative for back pain and neck pain.  Skin: Negative.   Neurological:  Positive for weakness. Negative for dizziness, syncope, speech difficulty, light-headedness and numbness.  Hematological:  Negative for adenopathy. Does not bruise/bleed easily.  Psychiatric/Behavioral:  Positive for confusion and decreased concentration. Negative for behavioral problems.  The patient is not nervous/anxious.     Objective:  BP (!) 146/68 (BP Location: Right Arm, Patient Position: Sitting, Cuff Size: Large)   Pulse 63   Temp 97.9 F (36.6 C) (Oral)   Resp 16   Ht  (1.803 m)   Wt 226 lb (102.5 kg)   SpO2 93%   BMI 31.52 kg/m   BP Readings from Last 3 Encounters:  03/04/23 (!) 146/68  01/19/23 130/80  12/17/22 128/76    Wt Readings from Last 3 Encounters:  03/04/23 226 lb (102.5 kg)  01/19/23 231 lb 12.8 oz (105.1 kg)  12/17/22 228 lb (103.4 kg)    Physical  Exam Vitals reviewed.  Constitutional:      Appearance: He is ill-appearing.  HENT:     Mouth/Throat:     Mouth: Mucous membranes are moist.  Eyes:     General: No scleral icterus.    Conjunctiva/sclera: Conjunctivae normal.  Cardiovascular:     Rate and Rhythm: Normal rate and regular rhythm.     Heart sounds: Murmur heard.     Systolic murmur is present with a grade of 3/6.     No friction rub. No gallop.  Pulmonary:     Effort: Pulmonary effort is normal.     Breath sounds: No stridor. No wheezing, rhonchi or rales.  Abdominal:     General: Abdomen is flat.     Palpations: There is no mass.     Tenderness: There is no abdominal tenderness. There is no guarding.     Hernia: No hernia is present.  Musculoskeletal:     Cervical back: Neck supple.     Right lower leg: No edema.     Left lower leg: No edema.  Lymphadenopathy:     Cervical: No cervical adenopathy.  Skin:    General: Skin is warm and dry.  Neurological:     Mental Status: He is alert.     Cranial Nerves: Cranial nerves 2-12 are intact.     Motor: Weakness, atrophy and abnormal muscle tone present.     Coordination: Romberg sign positive. Coordination abnormal. Heel to Shin Test abnormal. Finger-Nose-Finger Test normal. Impaired rapid alternating movements.     Gait: Gait abnormal.     Deep Tendon Reflexes: Reflexes normal. Babinski sign absent on the right side. Babinski sign absent on the left side.     Reflex Scores:      Tricep reflexes are 0 on the right side and 0 on the left side.      Bicep reflexes are 1+ on the right side and 1+ on the left side.      Brachioradialis reflexes are 1+ on the right side and 1+ on the left side.      Patellar reflexes are 1+ on the right side and 1+ on the left side.      Achilles reflexes are 0 on the right side and 0 on the left side.    Comments: Gait is wide-based and ataxic.  There is diffuse weakness in bilateral upper and lower extremities symmetrically.      Lab Results  Component Value Date   WBC 6.0 12/17/2022   HGB 14.2 12/17/2022   HCT 43.2 12/17/2022   PLT 203.0 12/17/2022   GLUCOSE 148 (H) 12/17/2022   CHOL 152 12/04/2021   TRIG 96.0 12/04/2021   HDL 40.20 12/04/2021   LDLDIRECT 81.0 05/26/2018   LDLCALC 93 12/04/2021   ALT 16 07/10/2022   AST 18 07/10/2022  NA 139 12/17/2022   K 4.1 12/17/2022   CL 101 12/17/2022   CREATININE 1.85 (H) 12/17/2022   BUN 39 (H) 12/17/2022   CO2 29 12/17/2022   TSH 2.33 12/17/2022   PSA 1.05 10/28/2015   INR 1.0 07/10/2022   HGBA1C 7.9 (H) 12/17/2022   MICROALBUR 28.3 (H) 12/17/2022    No results found.  Assessment & Plan:   Ataxia- Will evaluate for CVA, NPH, mass, demyelinating process, and bleeding. -     MR BRAIN WO CONTRAST; Future  Bradycardia- His heart rate is normal now.  Essential hypertension- His blood pressure is adequately well-controlled.     Follow-up: No follow-ups on file.  Sanda Linger, MD

## 2023-03-05 ENCOUNTER — Ambulatory Visit
Admission: RE | Admit: 2023-03-05 | Discharge: 2023-03-05 | Disposition: A | Discharge status: Home / Self Care | Payer: Medicare Other | Source: Ambulatory Visit | Attending: Internal Medicine | Admitting: Internal Medicine

## 2023-03-05 ENCOUNTER — Telehealth: Payer: Self-pay | Admitting: Radiology

## 2023-03-05 DIAGNOSIS — R270 Ataxia, unspecified: Secondary | ICD-10-CM | POA: Diagnosis not present

## 2023-03-05 NOTE — Telephone Encounter (Signed)
CRITICAL VALUE STICKER  CRITICAL VALUE: 1. 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 2. Progressive generalized atrophy and diffuse white matter disease. This likely reflects the sequela of chronic microvascular ischemia. 3. 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 4. The number of remote lacunar infarcts in the cerebellum bilaterally has increased. 5. Mild left sphenoid sinus disease.  RECEIVER (on-site recipient of call):Litha  DATE & TIME NOTIFIED: 03/05/23 3:32pm  MESSENGER (representative from lab):Diane radiology

## 2023-03-05 NOTE — Telephone Encounter (Signed)
Pt with acute stroke found on MRI brain - please call pt to go to ED now

## 2023-03-08 NOTE — Telephone Encounter (Signed)
Followed up with pt and he states he is doing good. I received the critical call and documented it sent it to Dr. Jonny Ruiz. I'm just seeing now seeing this note, due to an emergency with daughter I left soon after the critical call. Fortunately Dr. Yetta Barre did get into contact with the patient Saturday @ 5:30pm

## 2023-03-18 ENCOUNTER — Ambulatory Visit: Payer: Medicare Other | Admitting: Internal Medicine

## 2023-03-25 ENCOUNTER — Encounter (HOSPITAL_COMMUNITY): Payer: Self-pay

## 2023-03-25 ENCOUNTER — Emergency Department (HOSPITAL_COMMUNITY): Payer: Medicare Other

## 2023-03-25 ENCOUNTER — Other Ambulatory Visit: Payer: Self-pay

## 2023-03-25 ENCOUNTER — Telehealth: Payer: Self-pay

## 2023-03-25 ENCOUNTER — Observation Stay (HOSPITAL_COMMUNITY): Payer: Medicare Other

## 2023-03-25 ENCOUNTER — Inpatient Hospital Stay (HOSPITAL_COMMUNITY)
Admission: EM | Admit: 2023-03-25 | Discharge: 2023-03-31 | DRG: 064 | Disposition: A | Discharge status: Skilled Nursing Facility | Payer: Medicare Other | Attending: Internal Medicine | Admitting: Internal Medicine

## 2023-03-25 DIAGNOSIS — W19XXXA Unspecified fall, initial encounter: Secondary | ICD-10-CM

## 2023-03-25 DIAGNOSIS — Z79899 Other long term (current) drug therapy: Secondary | ICD-10-CM

## 2023-03-25 DIAGNOSIS — Z87891 Personal history of nicotine dependence: Secondary | ICD-10-CM

## 2023-03-25 DIAGNOSIS — I359 Nonrheumatic aortic valve disorder, unspecified: Secondary | ICD-10-CM | POA: Diagnosis not present

## 2023-03-25 DIAGNOSIS — I6319 Cerebral infarction due to embolism of other precerebral artery: Principal | ICD-10-CM | POA: Diagnosis present

## 2023-03-25 DIAGNOSIS — Z7982 Long term (current) use of aspirin: Secondary | ICD-10-CM | POA: Diagnosis not present

## 2023-03-25 DIAGNOSIS — I700 Atherosclerosis of aorta: Secondary | ICD-10-CM | POA: Diagnosis present

## 2023-03-25 DIAGNOSIS — Z66 Do not resuscitate: Secondary | ICD-10-CM | POA: Diagnosis present

## 2023-03-25 DIAGNOSIS — I350 Nonrheumatic aortic (valve) stenosis: Secondary | ICD-10-CM | POA: Diagnosis not present

## 2023-03-25 DIAGNOSIS — Z794 Long term (current) use of insulin: Secondary | ICD-10-CM

## 2023-03-25 DIAGNOSIS — E119 Type 2 diabetes mellitus without complications: Secondary | ICD-10-CM | POA: Diagnosis not present

## 2023-03-25 DIAGNOSIS — R569 Unspecified convulsions: Secondary | ICD-10-CM | POA: Diagnosis not present

## 2023-03-25 DIAGNOSIS — L89312 Pressure ulcer of right buttock, stage 2: Secondary | ICD-10-CM | POA: Diagnosis present

## 2023-03-25 DIAGNOSIS — R2689 Other abnormalities of gait and mobility: Secondary | ICD-10-CM | POA: Diagnosis not present

## 2023-03-25 DIAGNOSIS — Z888 Allergy status to other drugs, medicaments and biological substances status: Secondary | ICD-10-CM | POA: Diagnosis not present

## 2023-03-25 DIAGNOSIS — I10 Essential (primary) hypertension: Secondary | ICD-10-CM | POA: Diagnosis present

## 2023-03-25 DIAGNOSIS — R410 Disorientation, unspecified: Principal | ICD-10-CM

## 2023-03-25 DIAGNOSIS — I639 Cerebral infarction, unspecified: Secondary | ICD-10-CM | POA: Diagnosis present

## 2023-03-25 DIAGNOSIS — I129 Hypertensive chronic kidney disease with stage 1 through stage 4 chronic kidney disease, or unspecified chronic kidney disease: Secondary | ICD-10-CM | POA: Diagnosis not present

## 2023-03-25 DIAGNOSIS — R531 Weakness: Secondary | ICD-10-CM | POA: Diagnosis not present

## 2023-03-25 DIAGNOSIS — D696 Thrombocytopenia, unspecified: Secondary | ICD-10-CM | POA: Diagnosis present

## 2023-03-25 DIAGNOSIS — Z885 Allergy status to narcotic agent status: Secondary | ICD-10-CM | POA: Diagnosis not present

## 2023-03-25 DIAGNOSIS — M4802 Spinal stenosis, cervical region: Secondary | ICD-10-CM | POA: Diagnosis present

## 2023-03-25 DIAGNOSIS — E1122 Type 2 diabetes mellitus with diabetic chronic kidney disease: Secondary | ICD-10-CM | POA: Diagnosis present

## 2023-03-25 DIAGNOSIS — R4182 Altered mental status, unspecified: Secondary | ICD-10-CM | POA: Diagnosis not present

## 2023-03-25 DIAGNOSIS — E669 Obesity, unspecified: Secondary | ICD-10-CM | POA: Diagnosis present

## 2023-03-25 DIAGNOSIS — F0150 Vascular dementia without behavioral disturbance: Secondary | ICD-10-CM | POA: Diagnosis not present

## 2023-03-25 DIAGNOSIS — R296 Repeated falls: Secondary | ICD-10-CM | POA: Diagnosis not present

## 2023-03-25 DIAGNOSIS — E785 Hyperlipidemia, unspecified: Secondary | ICD-10-CM | POA: Diagnosis present

## 2023-03-25 DIAGNOSIS — I6523 Occlusion and stenosis of bilateral carotid arteries: Secondary | ICD-10-CM | POA: Diagnosis not present

## 2023-03-25 DIAGNOSIS — Z6831 Body mass index (BMI) 31.0-31.9, adult: Secondary | ICD-10-CM

## 2023-03-25 DIAGNOSIS — R29702 NIHSS score 2: Secondary | ICD-10-CM | POA: Diagnosis present

## 2023-03-25 DIAGNOSIS — I63133 Cerebral infarction due to embolism of bilateral carotid arteries: Secondary | ICD-10-CM | POA: Diagnosis not present

## 2023-03-25 DIAGNOSIS — Z8249 Family history of ischemic heart disease and other diseases of the circulatory system: Secondary | ICD-10-CM

## 2023-03-25 DIAGNOSIS — Z952 Presence of prosthetic heart valve: Secondary | ICD-10-CM

## 2023-03-25 DIAGNOSIS — E559 Vitamin D deficiency, unspecified: Secondary | ICD-10-CM | POA: Diagnosis not present

## 2023-03-25 DIAGNOSIS — N400 Enlarged prostate without lower urinary tract symptoms: Secondary | ICD-10-CM | POA: Diagnosis not present

## 2023-03-25 DIAGNOSIS — I358 Other nonrheumatic aortic valve disorders: Secondary | ICD-10-CM | POA: Diagnosis not present

## 2023-03-25 DIAGNOSIS — R270 Ataxia, unspecified: Secondary | ICD-10-CM | POA: Diagnosis not present

## 2023-03-25 DIAGNOSIS — R0602 Shortness of breath: Secondary | ICD-10-CM | POA: Diagnosis not present

## 2023-03-25 DIAGNOSIS — N1831 Chronic kidney disease, stage 3a: Secondary | ICD-10-CM | POA: Diagnosis not present

## 2023-03-25 DIAGNOSIS — I739 Peripheral vascular disease, unspecified: Secondary | ICD-10-CM | POA: Diagnosis not present

## 2023-03-25 DIAGNOSIS — Z7984 Long term (current) use of oral hypoglycemic drugs: Secondary | ICD-10-CM | POA: Diagnosis not present

## 2023-03-25 DIAGNOSIS — I6310 Cerebral infarction due to embolism of unspecified precerebral artery: Secondary | ICD-10-CM

## 2023-03-25 DIAGNOSIS — G9341 Metabolic encephalopathy: Secondary | ICD-10-CM | POA: Diagnosis not present

## 2023-03-25 DIAGNOSIS — L89322 Pressure ulcer of left buttock, stage 2: Secondary | ICD-10-CM | POA: Diagnosis present

## 2023-03-25 DIAGNOSIS — K219 Gastro-esophageal reflux disease without esophagitis: Secondary | ICD-10-CM | POA: Diagnosis not present

## 2023-03-25 DIAGNOSIS — M5440 Lumbago with sciatica, unspecified side: Secondary | ICD-10-CM | POA: Diagnosis not present

## 2023-03-25 DIAGNOSIS — J4520 Mild intermittent asthma, uncomplicated: Secondary | ICD-10-CM | POA: Diagnosis not present

## 2023-03-25 DIAGNOSIS — R001 Bradycardia, unspecified: Secondary | ICD-10-CM | POA: Diagnosis not present

## 2023-03-25 DIAGNOSIS — Z7401 Bed confinement status: Secondary | ICD-10-CM | POA: Diagnosis not present

## 2023-03-25 DIAGNOSIS — M6281 Muscle weakness (generalized): Secondary | ICD-10-CM | POA: Diagnosis not present

## 2023-03-25 DIAGNOSIS — Z043 Encounter for examination and observation following other accident: Secondary | ICD-10-CM | POA: Diagnosis not present

## 2023-03-25 DIAGNOSIS — N3281 Overactive bladder: Secondary | ICD-10-CM | POA: Diagnosis not present

## 2023-03-25 DIAGNOSIS — I6389 Other cerebral infarction: Secondary | ICD-10-CM | POA: Diagnosis not present

## 2023-03-25 LAB — LIPID PANEL
Cholesterol: 156 mg/dL (ref 0–200)
HDL: 45 mg/dL (ref >40)
LDL Cholesterol: 96 mg/dL (ref 0–99)
Total CHOL/HDL Ratio: 3.5 RATIO
Triglycerides: 75 mg/dL (ref <150)
VLDL: 15 mg/dL (ref 0–40)

## 2023-03-25 LAB — URINALYSIS, ROUTINE W REFLEX MICROSCOPIC
Bacteria, UA: NONE SEEN
Bilirubin Urine: NEGATIVE
Glucose, UA: >=500 mg/dL — AB
Hgb urine dipstick: NEGATIVE
Ketones, ur: 5 mg/dL — AB
Leukocytes,Ua: NEGATIVE
Nitrite: NEGATIVE
Protein, ur: >=300 mg/dL — AB
Specific Gravity, Urine: 1.014 (ref 1.005–1.030)
pH: 6 (ref 5.0–8.0)

## 2023-03-25 LAB — HEMOGLOBIN A1C
Hgb A1c MFr Bld: 6.7 % — ABNORMAL HIGH (ref 4.8–5.6)
Mean Plasma Glucose: 145.59 mg/dL

## 2023-03-25 LAB — BLOOD GAS, VENOUS
Acid-Base Excess: 3.5 mmol/L — ABNORMAL HIGH (ref 0.0–2.0)
Bicarbonate: 29.1 mmol/L — ABNORMAL HIGH (ref 20.0–28.0)
O2 Saturation: 70.4 %
Patient temperature: 37
pCO2, Ven: 47 mmHg (ref 44–60)
pH, Ven: 7.4 (ref 7.25–7.43)
pO2, Ven: 40 mmHg (ref 32–45)

## 2023-03-25 LAB — CBC WITH DIFFERENTIAL/PLATELET
Abs Immature Granulocytes: 0.05 10*3/uL (ref 0.00–0.07)
Basophils Absolute: 0.1 10*3/uL (ref 0.0–0.1)
Basophils Relative: 1 %
Eosinophils Absolute: 0.2 10*3/uL (ref 0.0–0.5)
Eosinophils Relative: 3 %
HCT: 53 % — ABNORMAL HIGH (ref 39.0–52.0)
Hemoglobin: 16.5 g/dL (ref 13.0–17.0)
Immature Granulocytes: 1 %
Lymphocytes Relative: 17 %
Lymphs Abs: 1.4 10*3/uL (ref 0.7–4.0)
MCH: 28.5 pg (ref 26.0–34.0)
MCHC: 31.1 g/dL (ref 30.0–36.0)
MCV: 91.5 fL (ref 80.0–100.0)
Monocytes Absolute: 0.6 10*3/uL (ref 0.1–1.0)
Monocytes Relative: 7 %
Neutro Abs: 5.8 10*3/uL (ref 1.7–7.7)
Neutrophils Relative %: 71 %
Platelets: 130 10*3/uL — ABNORMAL LOW (ref 150–400)
RBC: 5.79 MIL/uL (ref 4.22–5.81)
RDW: 14.7 % (ref 11.5–15.5)
WBC: 8.1 10*3/uL (ref 4.0–10.5)
nRBC: 0 % (ref 0.0–0.2)

## 2023-03-25 LAB — RAPID URINE DRUG SCREEN, HOSP PERFORMED
Amphetamines: NOT DETECTED
Barbiturates: NOT DETECTED
Benzodiazepines: NOT DETECTED
Cocaine: NOT DETECTED
Opiates: NOT DETECTED
Tetrahydrocannabinol: NOT DETECTED

## 2023-03-25 LAB — COMPREHENSIVE METABOLIC PANEL
ALT: 13 U/L (ref 0–44)
AST: 20 U/L (ref 15–41)
Albumin: 3.9 g/dL (ref 3.5–5.0)
Alkaline Phosphatase: 78 U/L (ref 38–126)
Anion gap: 10 (ref 5–15)
BUN: 22 mg/dL (ref 8–23)
CO2: 25 mmol/L (ref 22–32)
Calcium: 9 mg/dL (ref 8.9–10.3)
Chloride: 103 mmol/L (ref 98–111)
Creatinine, Ser: 1.34 mg/dL — ABNORMAL HIGH (ref 0.61–1.24)
GFR, Estimated: 52 mL/min — ABNORMAL LOW (ref >60)
Glucose, Bld: 119 mg/dL — ABNORMAL HIGH (ref 70–99)
Potassium: 3.7 mmol/L (ref 3.5–5.1)
Sodium: 138 mmol/L (ref 135–145)
Total Bilirubin: 1 mg/dL (ref 0.3–1.2)
Total Protein: 6.8 g/dL (ref 6.5–8.1)

## 2023-03-25 LAB — AMMONIA: Ammonia: 12 umol/L (ref 9–35)

## 2023-03-25 LAB — ETHANOL: Alcohol, Ethyl (B): <10 mg/dL (ref <10)

## 2023-03-25 LAB — CBG MONITORING, ED
Glucose-Capillary: 101 mg/dL — ABNORMAL HIGH (ref 70–99)
Glucose-Capillary: 187 mg/dL — ABNORMAL HIGH (ref 70–99)

## 2023-03-25 MED ORDER — TAMSULOSIN HCL 0.4 MG PO CAPS
0.4000 mg | ORAL_CAPSULE | Freq: Every day | ORAL | Status: DC
  Administered 2023-03-26 – 2023-03-31 (×6): 0.4 mg via ORAL
  Filled 2023-03-25 (×6): qty 1

## 2023-03-25 MED ORDER — INSULIN ASPART 100 UNIT/ML IJ SOLN
0.0000 [IU] | Freq: Three times a day (TID) | INTRAMUSCULAR | Status: DC
  Administered 2023-03-26 – 2023-03-27 (×2): 2 [IU] via SUBCUTANEOUS
  Filled 2023-03-25: qty 0.09

## 2023-03-25 MED ORDER — INSULIN ASPART 100 UNIT/ML IJ SOLN
0.0000 [IU] | Freq: Every day | INTRAMUSCULAR | Status: DC
  Administered 2023-03-26: 3 [IU] via SUBCUTANEOUS
  Filled 2023-03-25: qty 0.05

## 2023-03-25 MED ORDER — STROKE: EARLY STAGES OF RECOVERY BOOK
Freq: Once | Status: AC
  Filled 2023-03-25 (×2): qty 1

## 2023-03-25 MED ORDER — ENOXAPARIN SODIUM 40 MG/0.4ML IJ SOSY
40.0000 mg | PREFILLED_SYRINGE | INTRAMUSCULAR | Status: DC
  Administered 2023-03-25 – 2023-03-28 (×4): 40 mg via SUBCUTANEOUS
  Filled 2023-03-25 (×4): qty 0.4

## 2023-03-25 MED ORDER — IOHEXOL 350 MG/ML SOLN
100.0000 mL | Freq: Once | INTRAVENOUS | Status: AC | PRN
  Administered 2023-03-25: 100 mL via INTRAVENOUS

## 2023-03-25 MED ORDER — ACETAMINOPHEN 650 MG RE SUPP: 650.0000 mg | RECTAL | Status: DC | PRN

## 2023-03-25 MED ORDER — HYDRALAZINE HCL 20 MG/ML IJ SOLN
10.0000 mg | INTRAMUSCULAR | Status: DC | PRN
  Administered 2023-03-26: 10 mg via INTRAVENOUS
  Filled 2023-03-25: qty 1

## 2023-03-25 MED ORDER — CLOPIDOGREL BISULFATE 75 MG PO TABS
75.0000 mg | ORAL_TABLET | Freq: Every day | ORAL | Status: DC
  Administered 2023-03-26 – 2023-03-29 (×4): 75 mg via ORAL
  Filled 2023-03-25 (×4): qty 1

## 2023-03-25 MED ORDER — PRAVASTATIN SODIUM 10 MG PO TABS
40.0000 mg | ORAL_TABLET | Freq: Every day | ORAL | Status: DC
  Administered 2023-03-26: 40 mg via ORAL
  Filled 2023-03-25: qty 4
  Filled 2023-03-25: qty 2

## 2023-03-25 MED ORDER — ASPIRIN 81 MG PO CHEW
81.0000 mg | CHEWABLE_TABLET | Freq: Every day | ORAL | Status: DC
  Administered 2023-03-25 – 2023-03-29 (×5): 81 mg via ORAL
  Filled 2023-03-25 (×5): qty 1

## 2023-03-25 MED ORDER — SENNOSIDES-DOCUSATE SODIUM 8.6-50 MG PO TABS: 1.0000 | ORAL_TABLET | Freq: Every evening | ORAL | Status: DC | PRN

## 2023-03-25 MED ORDER — ACETAMINOPHEN 160 MG/5ML PO SOLN: 650.0000 mg | ORAL | Status: DC | PRN

## 2023-03-25 MED ORDER — ACETAMINOPHEN 325 MG PO TABS: 650.0000 mg | ORAL_TABLET | ORAL | Status: DC | PRN

## 2023-03-25 NOTE — ED Triage Notes (Signed)
Pt BIB EMS from home. Pt baseline is A&Ox4, pt wife c/o altered mental status x1 month. EMS reports strong urine odor. Pt is diabetic. CBG 129.  EMS reports pt is afebrile, A&Ox1. VSS.   18G L AC

## 2023-03-25 NOTE — Telephone Encounter (Signed)
Pts wife Ms. Arts has called and stated pt fell last  night and has been having weakness, disorientation and confusion. Pts wife said pt is having more weakness and the above sxs this morning. Pts wife states it was determined opt had a stroke last week via MRI by Dr. Yetta Barre and is concerned pt is having another stroke as these sxs were what he was experiencing when he had the stroke last week. Pts wife has asked if I can call 911 and explain everything to them for her and I agreed.  ** I was able to call 911 and have EMS go to the pts home a/w explaining what is going on with the pt now a/w his recent stroke finding last week. EMS is on there way to the pts home now.

## 2023-03-25 NOTE — ED Provider Notes (Addendum)
Repeat MRI shows new infarct. Needs admit [facility] medical service. Neuro consult done. Physical Exam  BP (!) 195/82   Pulse 70   Temp 98.4 F (36.9 C) (Oral)   Resp 18   Ht  (1.803 m)   Wt 102.1 kg   SpO2 96%   BMI 31.38 kg/m   Physical Exam  Procedures  Procedures  ED Course / MDM    Medical Decision Making Amount and/or Complexity of Data Reviewed Labs: ordered. Radiology: ordered.  Risk Decision regarding hospitalization.   Consult: Dr. Allena Katz excepts for admission.       Arby Barrette, MD 03/25/23 Donavan Burnet, MD 03/25/23 [phone]

## 2023-03-25 NOTE — Hospital Course (Signed)
Ryan Duke is a 82 y.o. male with medical history significant for insulin-dependent T2DM, CKD stage IIIa, severe aortic stenosis s/p TAVR, HTN, HLD, BPH who is admitted for stroke workup.

## 2023-03-25 NOTE — ED Notes (Signed)
Pt BP taken twice, both in right and left arm

## 2023-03-25 NOTE — ED Notes (Signed)
Pt was given a [place] sandwhich

## 2023-03-25 NOTE — ED Provider Notes (Signed)
[place] EMERGENCY DEPARTMENT AT [facility] Provider Note   CSN: [id number] Arrival date & time: 03/25/23  1028     History  Chief Complaint  Patient presents with   Altered Mental Status    Ryan Duke is a 82 y.o. male.  82 year old male with a history of hypertension, hyperlipidemia, diabetes, CKD, obesity, and aortic stenosis status post TAVR not on anticoagulation who presents emergency department with fall and altered mental status.  History obtained predominantly per the patient's wife.  Says that recently he has been having frequent falls.  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  His outpatient doctor thought that this was likely the cause of his frequent falling.  Take aspirin and pravastatin but is not on any other blood thinners.  Last night had another fall when he was walking from his rollator and says that it looked like his legs were not working and he was wobbly.  Said that this morning he was not as coherent as usual and had slowed speech with difficulty with word finding and following directions.  Did not believe he had any significant head trauma or injuries as result of the fall.  No other recent illnesses.        Home Medications Prior to Admission medications   Medication Sig Start Date End Date Taking? Authorizing Provider  amoxicillin (AMOXIL) 500 MG tablet Take 4 tablets (2,000 mg total) by mouth as directed. 1 hour prior to dental work including cleanings 07/24/22   Janetta Hora, PA-C  aspirin 81 MG chewable tablet Chew 1 tablet (81 mg total) by mouth daily. 07/15/22   Janetta Hora, PA-C  atenolol (TENORMIN) 50 MG tablet Take 1 tablet (50 mg total) by mouth daily. 02/18/23   [name], MD  Continuous Blood Gluc Receiver (FREESTYLE LIBRE 2 READER) DEVI USE AS DIRECTED. 08/04/22   [name], MD   Continuous Blood Gluc Sensor (FREESTYLE LIBRE 2 SENSOR) MISC USE AS DIRECTED AND REPLACE EVERY 14 DAYS. 09/22/22   [name], MD  DULoxetine (CYMBALTA) 60 MG capsule TAKE ONE CAPSULE DAILY 11/09/22   [name], MD  fluticasone [facility]) 50 MCG/ACT nasal spray Place 1 spray into both nostrils daily as needed for allergies or rhinitis.    [provider]  insulin glargine (LANTUS SOLOSTAR) 100 UNIT/ML Solostar Pen Inject 30 Units into the skin daily. 02/10/23   [name], MD  Insulin Pen Needle (NOVOFINE) 32G X 6 MM MISC 1 Act by Does not apply route daily. 06/04/20   [name], MD  JARDIANCE 25 MG TABS tablet TAKE ONE TABLET BY MOUTH ONCE DAILY BEFORE BREAKFAST 01/11/23   [name], MD  KERENDIA 20 MG TABS Take 1 tablet by mouth daily. 02/15/23   [name], MD  losartan (COZAAR) 100 MG tablet Take 1 tablet (100 mg total) by mouth daily for high blood pressure 09/25/22   [name], MD  Multiple Vitamin (MULTIVITAMIN WITH MINERALS) TABS tablet Take 1 tablet by mouth daily.    [provider]  Multiple Vitamins-Minerals (PRESERVISION AREDS PO) Take 1 tablet by mouth in the morning and at bedtime.    [provider]  MYRBETRIQ 50 MG TB24 tablet TAKE 1 TABLET ONCE DAILY. 07/17/20   [name], MD  pravastatin (PRAVACHOL)  40 MG tablet TAKE ONE TABLET BY MOUTH ONCE DAILY 02/23/23   [name], MD  pregabalin (LYRICA) 150 MG capsule Take 150 mg by mouth See admin instructions. Take 150 mg daily, may take an additional 150 mg twice daily as needed for pain 03/26/20   [provider]  tamsulosin (FLOMAX) 0.4 MG CAPS capsule TAKE ONE CAPSULE BY MOUTH DAILY 12/20/22   [name], MD  vitamin B-12 (CYANOCOBALAMIN) 500 MCG tablet Take 500 mcg by mouth daily.    [provider]      Allergies    Farxiga [dapagliflozin], Metformin and related, Atorvastatin, Benazepril hcl, Flexeril [cyclobenzaprine], and Oxycodone     Review of Systems   Review of Systems  Physical Exam Updated Vital Signs BP (!) 195/82   Pulse 70   Temp 98.4 F (36.9 C) (Oral)   Resp 18   Ht 5\' 11"  (1.803 m)   Wt 102.1 kg   SpO2 96%   BMI 31.38 kg/m  Physical Exam Vitals and nursing note reviewed.  Constitutional:      General: He is not in acute distress.    Appearance: He is well-developed.     Comments: Well-appearing.  Alert and manage to person and place but did not know the date.  HENT:     Head: Normocephalic and atraumatic.     Right Ear: External ear normal.     Left Ear: External ear normal.     Nose: Nose normal.  Eyes:     Extraocular Movements: Extraocular movements intact.     Conjunctiva/sclera: Conjunctivae normal.     Pupils: Pupils are equal, round, and reactive to light.  Neck:     Comments: No C-spine tenderness to palpation Cardiovascular:     Rate and Rhythm: Normal rate and regular rhythm.  Pulmonary:     Effort: Pulmonary effort is normal. No respiratory distress.  Musculoskeletal:     Cervical back: Normal range of motion and neck supple.     Right lower leg: No edema.     Left lower leg: No edema.  Skin:    General: Skin is warm and dry.  Neurological:     Mental Status: He is alert.     Comments: MENTAL STATUS: AAOx3 CRANIAL NERVES: II: Pupils equal and reactive 3 mm BL, no RAPD, no VF deficits III, IV, VI: EOM intact, no gaze preference or deviation, no nystagmus. V: normal sensation to light touch in V1, V2, and V3 segments bilaterally VII: no facial weakness or asymmetry, no nasolabial fold flattening VIII: normal hearing to speech and finger friction IX, X: normal palatal elevation, no uvular deviation XI: 5/5 head turn and 5/5 shoulder shrug bilaterally XII: midline tongue protrusion MOTOR: 5/5 strength in R shoulder flexion, elbow flexion and extension, and grip strength. 5/5 strength in L shoulder flexion, elbow flexion and extension, and grip strength.  5/5 strength in  R hip and knee flexion, knee extension, ankle plantar and dorsiflexion. 5/5 strength in L hip and knee flexion, knee extension, ankle plantar and dorsiflexion. SENSORY: Normal sensation to light touch in all extremities COORD: Normal finger to nose and heel to shin, no tremor, no dysmetria STATION: no truncal ataxia  Psychiatric:        Mood and Affect: Mood normal.        Behavior: Behavior normal.    ED Results / Procedures / Treatments   Labs (all labs ordered are listed, but only abnormal results are displayed) Labs Reviewed  COMPREHENSIVE METABOLIC PANEL - Abnormal; Notable for the following components:      Result Value   Glucose, Bld 119 (*)    Creatinine, Ser 1.34 (*)    GFR, Estimated 52 (*)    All other components within normal limits  BLOOD GAS, VENOUS - Abnormal; Notable for the following components:   Bicarbonate 29.1 (*)    Acid-Base Excess 3.5 (*)    All other components within normal limits  CBC WITH DIFFERENTIAL/PLATELET - Abnormal; Notable for the following components:   HCT 53.0 (*)    Platelets 130 (*)    All other components within normal limits  URINALYSIS, ROUTINE W REFLEX MICROSCOPIC - Abnormal; Notable for the following components:   Glucose, UA >=500 (*)    Ketones, ur 5 (*)    Protein, ur >=300 (*)    All other components within normal limits  CBG MONITORING, ED - Abnormal; Notable for the following components:   Glucose-Capillary 101 (*)    All other components within normal limits  AMMONIA  RAPID URINE DRUG SCREEN, HOSP PERFORMED  ETHANOL  LIPID PANEL  HEMOGLOBIN A1C    EKG EKG Interpretation  Date/Time:  Thursday March 25 2023 10:55:04 EDT Ventricular Rate:  60 PR Interval:  204 QRS Duration: 91 QT Interval:  452 QTC Calculation: 452 R Axis:   234 Text Interpretation: Sinus or ectopic atrial rhythm Atrial premature complex Consider left atrial enlargement Inferior infarct, old Consider anterior infarct Lateral leads are also involved  Confirmed by Vonita Moss [phone]) on 03/25/2023 6:18:03 PM  Radiology CT HEAD WO CONTRAST  Result Date: 03/25/2023 CLINICAL DATA:  Fall EXAM: CT HEAD WITHOUT CONTRAST CT CERVICAL SPINE WITHOUT CONTRAST TECHNIQUE: Multidetector CT imaging of the head and cervical spine was performed following the standard protocol without intravenous contrast. Multiplanar CT image reconstructions of the cervical spine were also generated. RADIATION DOSE REDUCTION: This exam was performed according to the departmental dose-optimization program which includes automated exposure control, adjustment of the mA and/or kV according to patient size and/or use of iterative reconstruction technique. COMPARISON:  None Available. FINDINGS: CT HEAD FINDINGS Brain: No evidence of acute infarction, hemorrhage, hydrocephalus, extra-axial collection or mass lesion/mass effect. There is sequela of moderate severe chronic microvascular ischemic change. Vascular: No hyperdense vessel or unexpected calcification. Skull: Normal. Negative for fracture or focal lesion. Sinuses/Orbits: No middle ear or mastoid effusion. Paranasal sinuses clear. Bilateral lens replacement. Orbits are otherwise unremarkable Other: None. CT CERVICAL SPINE FINDINGS Alignment: There is straightening of the normal cervical lordosis. Skull base and vertebrae: No acute fracture. No primary bone lesion or focal pathologic process. Soft tissues and spinal canal: No prevertebral fluid or swelling. No visible canal hematoma. 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 Other: None IMPRESSION: 1. No CT evidence of intracranial injury. Sequela of moderate to severe chronic microvascular ischemic change. 2. No acute fracture or traumatic malalignment of the cervical spine. 3. 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 Electronically Signed   By: Lorenza Cambridge M.D.   On: 03/25/2023 13:04   CT Cervical Spine Wo Contrast  Result Date:  03/25/2023 CLINICAL DATA:  Fall EXAM: CT HEAD WITHOUT CONTRAST CT CERVICAL SPINE WITHOUT CONTRAST TECHNIQUE: Multidetector CT imaging of the head and cervical spine was performed following the standard protocol without intravenous contrast. Multiplanar CT image reconstructions of the cervical spine were also generated. RADIATION DOSE REDUCTION: This exam was performed according to the departmental dose-optimization program which includes automated exposure  control, adjustment of the mA and/or kV according to patient size and/or use of iterative reconstruction technique. COMPARISON:  None Available. FINDINGS: CT HEAD FINDINGS Brain: No evidence of acute infarction, hemorrhage, hydrocephalus, extra-axial collection or mass lesion/mass effect. There is sequela of moderate severe chronic microvascular ischemic change. Vascular: No hyperdense vessel or unexpected calcification. Skull: Normal. Negative for fracture or focal lesion. Sinuses/Orbits: No middle ear or mastoid effusion. Paranasal sinuses clear. Bilateral lens replacement. Orbits are otherwise unremarkable Other: None. CT CERVICAL SPINE FINDINGS Alignment: There is straightening of the normal cervical lordosis. Skull base and vertebrae: No acute fracture. No primary bone lesion or focal pathologic process. Soft tissues and spinal canal: No prevertebral fluid or swelling. No visible canal hematoma. 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 Other: None IMPRESSION: 1. No CT evidence of intracranial injury. Sequela of moderate to severe chronic microvascular ischemic change. 2. No acute fracture or traumatic malalignment of the cervical spine. 3. 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 Electronically Signed   By: Lorenza Cambridge M.D.   On: 03/25/2023 13:04    Procedures Procedures   Medications Ordered in ED Medications - No data to display  ED Course/ Medical Decision Making/ A&P                              Medical Decision Making Amount and/or Complexity of Data Reviewed Labs: ordered. Radiology: ordered.  Risk Decision regarding hospitalization.   Ryan Duke is a 82 y.o. male with comorbidities that complicate the patient evaluation including hypertension, hyperlipidemia, diabetes, CKD, and aortic stenosis status post TAVR who presents to the emergency department with fall and altered mental status with outpatient MRI showing recent stroke  Initial Ddx:  Stroke, delirium, UTI, medication side effect, ICH/C-spine injury, intoxication  MDM:  Concern the patient may have had a stroke given his outpatient imaging and word finding difficulty today.  No signs of LVO appears to be outside the window for intervention at this time.  Will obtain CT of the head and C-spine to evaluate for any traumatic injury which could be ordered because of his symptoms.  May warrant an MRI afterwards.  Will also obtain general altered mental status at this time which will include urinalysis to assess for infection and as well as any other substances that could be in his system and causing his symptoms.  Plan:  Labs Ammonia Ethanol Urine drug screen urine urinalysis CT head and C-spine EKG  ED Summary/Re-evaluation:  Patient underwent the above workup did not show any acute abnormalities.  Did have the MRI and neurology was consulted due to feels that there may be a needed frontal lobe stroke.  Requested CTA and admission to [facility] for further management of his stroke and altered mental status.  This patient presents to the ED for concern of complaints listed in HPI, this involves an extensive number of treatment options, and is a complaint that carries with it a high risk of complications and morbidity. Disposition including potential need for admission considered.   Dispo: Admit to Floor  Additional history obtained from spouse Records reviewed Outpatient Clinic Notes The following labs were  independently interpreted: Chemistry and show CKD I independently reviewed the following imaging with scope of interpretation limited to determining acute life threatening conditions related to emergency care: CT Head and agree with the radiologist interpretation with the following exceptions: None I personally reviewed  and interpreted cardiac monitoring: normal sinus rhythm  I personally reviewed and interpreted the pt's EKG: see above for interpretation  I have reviewed the patients home medications and made adjustments as needed Consults: Hospitalist and Neurology Social Determinants of health:  Elderly  Final Clinical Impression(s) / ED Diagnoses Final diagnoses:  Disorientation  Cerebrovascular accident (CVA) due to embolism of precerebral artery  Fall, initial encounter    Rx / DC Orders ED Discharge Orders     None         Rondel Baton, MD 03/25/23 1820

## 2023-03-25 NOTE — H&P (Signed)
History and Physical    SHERRIL HEYWARD ZOX:[id number] DOB: 1941/08/16 DOA: 03/25/2023  PCP: [name], MD  Patient coming from: Home  I have personally briefly reviewed patient's old medical records in [facility]  Chief Complaint: Frequent falls  HPI: Ryan Duke is a 82 y.o. male with medical history significant for insulin-dependent T2DM, CKD stage IIIa, severe aortic stenosis s/p TAVR, HTN, HLD, BPH who presented to the ED for evaluation for recurrent falls.  Patient has been falling frequently over the last month.  He says that he loses balance when walking resulting in the falls.  Normally he ambulates with either a rollator or cane.  He did have an outpatient MRI on 03/05/2023 which showed a 4 mm acute subcortical nonhemorrhagic white matter infarct in the posterior right temporal lobe.  Remote lacunar infarcts of the thalami bilaterally and high left lentiform nucleus as well as cerebellum bilaterally were also noted.  Per documentation it appears he was advised to go to the ED after MRI resulted however did not do so at the time.  Patient had another fall last night.  He was reportedly disoriented.  Patient states that he will feel weak in his arms or legs after a certain amount of activity but no focal deficit at this time.  He denies any chest pain or dyspnea.  He states he does take aspirin 81 mg daily.  He was previously on Plavix post TAVR but now off.  ED Course  Labs/Imaging on admission: I have personally reviewed following labs and imaging studies.  Initial vitals showed BP 196/70, pulse 64, RR 21, temp 98.4 F, SpO2 97% on room air.  Labs show WBC 8.1, hemoglobin 16.5, platelets 130,000, sodium 138, potassium 3.7, bicarb 25, BUN 22, creatinine 1.34, serum glucose 119, LFTs within normal limits, ammonia 12.  Urinalysis negative for UTI.  UDS negative.  CT head without contrast negative for acute intracranial injury.  CT cervical spine without contrast  negative for acute fracture or traumatic malalignment of the C-spine.  Moderate spinal canal stenosis at C6-7 noted.  MRI brain without contrast showed patchy subcentimeter acute ischemic nonhemorrhagic infarcts involving the anterior right corona radiata/basal ganglia and contralateral subcortical left frontal lobe.  Normal interval evolution of previously identified subcentimeter infarct in the right temporal occipital junction also seen.  Neurology consulted and recommended medical admission to [facility].  The hospitalist service was consulted to admit for further evaluation and management.  Review of Systems: All systems reviewed and are negative except as documented in history of present illness above.   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 Excell Seltzer and Dr. Laneta Simmers   Severe aortic stenosis     Past Surgical History:  Procedure Laterality Date   ALVEOLOPLASTY N/A 05/14/2022   Procedure: ALVEOLOPLASTY;  Surgeon: Sharman Cheek, DMD;  Location: [facility] OR;  Service: Dentistry;  Laterality: N/A;   CARDIAC CATHETERIZATION     EYE SURGERY  2021   cataracts   FLEXIBLE SIGMOIDOSCOPY N/A 09/10/2017   Procedure: FLEXIBLE SIGMOIDOSCOPY;  Surgeon: Napoleon Form, MD;  Location: [facility] END[facility];  Service: Endoscopy;  Laterality: N/A;   IR KYPHO LUMBAR INC  FX REDUCE BONE BX UNI/BIL CANNULATION INC/IMAGING  09/14/2017   IR RADIOLOGIST EVAL & MGMT  10/07/2017   RIGHT HEART CATH AND CORONARY ANGIOGRAPHY N/A 05/07/2022   Procedure: RIGHT HEART CATH AND CORONARY ANGIOGRAPHY;  Surgeon: Tonny Bollman, MD;  Location: [facility] INVASIVE CV LAB;  Service: Cardiovascular;  Laterality: N/A;   TEE WITHOUT CARDIOVERSION N/A 07/14/2022   Procedure:  TRANSESOPHAGEAL ECHOCARDIOGRAM (TEE);  Surgeon: Tonny Bollman, MD;  Location: [facility] INVASIVE CV LAB;  Service: Open Heart Surgery;  Laterality: N/A;   TONSILLECTOMY     TOOTH EXTRACTION N/A 05/14/2022   Procedure: DENTAL EXTRACTIONS TEETH NUMBER TWO, THREE, FOUR, FIVE, FOURTEEN, FIFTEEN, TWENTY;  Surgeon: Sharman Cheek, DMD;  Location: [facility] OR;  Service: Dentistry;  Laterality: N/A;    Social History:  reports that he quit smoking about 48 years ago. His smoking use included cigarettes. He has never been exposed to tobacco smoke. He has never used smokeless tobacco. He reports that he does not currently use alcohol. He reports that he does not use drugs.  Allergies  Allergen Reactions   Farxiga [Dapagliflozin] Other (See Comments)    Gout    Metformin And Related Other (See Comments)    "Acidosis"   Atorvastatin Other (See Comments)    Headaches    Benazepril Hcl Other (See Comments)    Was not effective   Flexeril [Cyclobenzaprine] Other (See Comments)    Hallucinations    Oxycodone Other (See Comments)    Hallucinations     Family History  Problem Relation Age of Onset   Hypertension Other      Prior to Admission medications   Medication Sig Start Date End Date Taking? Authorizing Provider  amoxicillin (AMOXIL) 500 MG tablet Take 4 tablets (2,000 mg total) by mouth as directed. 1 hour prior to dental work including cleanings 07/24/22   Janetta Hora, PA-C  aspirin 81 MG chewable tablet Chew 1 tablet (81 mg total) by mouth daily. 07/15/22   Janetta Hora, PA-C  atenolol (TENORMIN) 50 MG tablet Take 1 tablet (50 mg total) by mouth daily. 02/18/23   [name], MD  Continuous Blood Gluc Receiver (FREESTYLE LIBRE 2 READER) DEVI USE AS DIRECTED. 08/04/22   [name], MD  Continuous Blood Gluc Sensor (FREESTYLE LIBRE 2 SENSOR) MISC USE AS DIRECTED AND REPLACE EVERY 14 DAYS. 09/22/22   [name], MD  DULoxetine (CYMBALTA) 60 MG capsule TAKE ONE CAPSULE  DAILY 11/09/22   [name], MD  fluticasone [facility]) 50 MCG/ACT nasal spray Place 1 spray into both nostrils daily as needed for allergies or rhinitis.    [provider]  insulin glargine (LANTUS SOLOSTAR) 100 UNIT/ML Solostar Pen Inject 30 Units into the skin daily. 02/10/23   [name], MD  Insulin Pen Needle (NOVOFINE) 32G X 6 MM MISC 1 Act by Does not apply route daily. 06/04/20   [name], MD  JARDIANCE 25 MG TABS tablet TAKE ONE TABLET BY MOUTH ONCE DAILY BEFORE BREAKFAST 01/11/23   [name], MD  KERENDIA 20 MG TABS Take 1 tablet by mouth daily. 02/15/23   [name], MD  losartan (COZAAR) 100 MG tablet Take 1 tablet (100 mg total) by mouth daily for high blood pressure 09/25/22   [name], MD  Multiple Vitamin (MULTIVITAMIN WITH MINERALS) TABS tablet Take 1 tablet by mouth daily.    [provider]  Multiple Vitamins-Minerals (PRESERVISION AREDS PO) Take 1 tablet by mouth in  the morning and at bedtime.    [provider]  MYRBETRIQ 50 MG TB24 tablet TAKE 1 TABLET ONCE DAILY. 07/17/20   [name], MD  pravastatin (PRAVACHOL) 40 MG tablet TAKE ONE TABLET BY MOUTH ONCE DAILY 02/23/23   [name], MD  pregabalin (LYRICA) 150 MG capsule Take 150 mg by mouth See admin instructions. Take 150 mg daily, may take an additional 150 mg twice daily as needed for pain 03/26/20   [provider]  tamsulosin (FLOMAX) 0.4 MG CAPS capsule TAKE ONE CAPSULE BY MOUTH DAILY 12/20/22   [name], MD  vitamin B-12 (CYANOCOBALAMIN) 500 MCG tablet Take 500 mcg by mouth daily.    [provider]    Physical Exam: Vitals:   03/25/23 1745 03/25/23 1845 03/25/23 1930 03/25/23 1932  BP: (!) 195/82 (!) 230/102 (!) 183/92   Pulse: 70 76 68   Resp: 18 16 (!) 27   Temp:    98.5 F (36.9 C)  TempSrc:    Oral  SpO2: 96% 97% 95%   Weight:      Height:       Constitutional: Resting in bed, NAD, calm, comfortable Eyes: EOMI,  lids and conjunctivae normal ENMT: Mucous membranes are moist. Posterior pharynx clear of any exudate or lesions.Normal dentition.  Neck: normal, supple, no masses. Respiratory: clear to auscultation bilaterally, no wheezing, no crackles. Normal respiratory effort. No accessory muscle use.  Cardiovascular: Regular rate and rhythm, no murmurs / rubs / gallops. No extremity edema. 2+ pedal pulses. Abdomen: no tenderness, no masses palpated.  Musculoskeletal: no clubbing / cyanosis. No joint deformity upper and lower extremities. Good ROM, no contractures. Normal muscle tone.  Skin: no rashes, lesions, ulcers. No induration Neurologic: Speech is slow otherwise fluent, CN 2-12 grossly intact. Sensation intact. Strength 5/5 in all 4.  Psychiatric: Alert and oriented x 3. Normal mood.   EKG: Personally reviewed. Sinus rhythm, rate 60, PAC present.  Rate is faster when compared to prior.  Assessment/Plan Principal Problem:   Acute CVA (cerebrovascular accident) (HCC) Active Problems:   Hyperlipidemia with target LDL less than 100   Essential hypertension   Chronic kidney disease, stage 3a (HCC)   Insulin-requiring or dependent type II diabetes mellitus (HCC)   Devontaye Ground Lieder is a 82 y.o. male with medical history significant for insulin-dependent T2DM, CKD stage IIIa, severe aortic stenosis s/p TAVR, HTN, HLD, BPH who is admitted for stroke workup.  Assessment and Plan: Acute ischemic infarcts of anterior right corona radiata/basal ganglia and subcortical left frontal lobe: Seen on MRI brain, multiple remote lacunar infarcts involving the hemispheric cerebral white matter, deep gray nuclei, and cerebellum also noted. -Neurology to consult -Admit to [facility] -Follow CTA head and neck -Obtain echocardiogram -Continue aspirin 81 mg daily -Start Plavix and 5 mg daily -Keep on telemetry, continue neurochecks -PT/OT/SLP eval -Follow A1c and lipid panel -Allow permissive hypertension  Type  2 diabetes: Placed on SSI.  Follow A1c.  Hypertension: Holding antihypertensives to allow for permissive hypertension.  CKD stage IIIa: Renal function stable.  Hyperlipidemia: Continue pravastatin.   DVT prophylaxis: enoxaparin (LOVENOX) injection 40 mg Start: 03/25/23 2045 Code Status: DNR, confirmed with patient on admission.  Neighbor present at bedside. Family Communication: Neighbor at bedside Disposition Plan: From home, dispo pending clinical progress Consults called: Neurology Severity of Illness: The appropriate patient status for this patient is OBSERVATION. Observation status is judged to be reasonable and necessary in order to provide the required  intensity of service to ensure the patient's safety. The patient's presenting symptoms, physical exam findings, and initial radiographic and laboratory data in the context of their medical condition is felt to place them at decreased risk for further clinical deterioration. Furthermore, it is anticipated that the patient will be medically stable for discharge from the hospital within 2 midnights of admission.   Darreld Mclean MD [facility]  If 7PM-7AM, please contact night-coverage [email or web address]  03/25/2023, 8:40 PM

## 2023-03-26 ENCOUNTER — Other Ambulatory Visit (HOSPITAL_COMMUNITY): Payer: Medicare Other

## 2023-03-26 ENCOUNTER — Observation Stay (HOSPITAL_COMMUNITY): Payer: Medicare Other

## 2023-03-26 DIAGNOSIS — R4182 Altered mental status, unspecified: Secondary | ICD-10-CM | POA: Diagnosis not present

## 2023-03-26 DIAGNOSIS — I6319 Cerebral infarction due to embolism of other precerebral artery: Secondary | ICD-10-CM | POA: Diagnosis not present

## 2023-03-26 DIAGNOSIS — Z7982 Long term (current) use of aspirin: Secondary | ICD-10-CM | POA: Diagnosis not present

## 2023-03-26 DIAGNOSIS — I639 Cerebral infarction, unspecified: Secondary | ICD-10-CM | POA: Diagnosis not present

## 2023-03-26 DIAGNOSIS — L89312 Pressure ulcer of right buttock, stage 2: Secondary | ICD-10-CM | POA: Diagnosis not present

## 2023-03-26 DIAGNOSIS — R569 Unspecified convulsions: Secondary | ICD-10-CM

## 2023-03-26 DIAGNOSIS — Z952 Presence of prosthetic heart valve: Secondary | ICD-10-CM | POA: Diagnosis not present

## 2023-03-26 DIAGNOSIS — Z888 Allergy status to other drugs, medicaments and biological substances status: Secondary | ICD-10-CM | POA: Diagnosis not present

## 2023-03-26 DIAGNOSIS — N400 Enlarged prostate without lower urinary tract symptoms: Secondary | ICD-10-CM | POA: Diagnosis not present

## 2023-03-26 DIAGNOSIS — E785 Hyperlipidemia, unspecified: Secondary | ICD-10-CM | POA: Diagnosis not present

## 2023-03-26 DIAGNOSIS — N1831 Chronic kidney disease, stage 3a: Secondary | ICD-10-CM | POA: Diagnosis not present

## 2023-03-26 DIAGNOSIS — D696 Thrombocytopenia, unspecified: Secondary | ICD-10-CM | POA: Diagnosis not present

## 2023-03-26 DIAGNOSIS — Z87891 Personal history of nicotine dependence: Secondary | ICD-10-CM | POA: Diagnosis not present

## 2023-03-26 DIAGNOSIS — E669 Obesity, unspecified: Secondary | ICD-10-CM | POA: Diagnosis not present

## 2023-03-26 DIAGNOSIS — I700 Atherosclerosis of aorta: Secondary | ICD-10-CM | POA: Diagnosis not present

## 2023-03-26 DIAGNOSIS — F0150 Vascular dementia without behavioral disturbance: Secondary | ICD-10-CM | POA: Diagnosis not present

## 2023-03-26 DIAGNOSIS — Z8249 Family history of ischemic heart disease and other diseases of the circulatory system: Secondary | ICD-10-CM | POA: Diagnosis not present

## 2023-03-26 DIAGNOSIS — R296 Repeated falls: Secondary | ICD-10-CM | POA: Diagnosis not present

## 2023-03-26 DIAGNOSIS — L89322 Pressure ulcer of left buttock, stage 2: Secondary | ICD-10-CM | POA: Diagnosis not present

## 2023-03-26 DIAGNOSIS — Z66 Do not resuscitate: Secondary | ICD-10-CM | POA: Diagnosis not present

## 2023-03-26 DIAGNOSIS — I129 Hypertensive chronic kidney disease with stage 1 through stage 4 chronic kidney disease, or unspecified chronic kidney disease: Secondary | ICD-10-CM | POA: Diagnosis not present

## 2023-03-26 DIAGNOSIS — Z794 Long term (current) use of insulin: Secondary | ICD-10-CM | POA: Diagnosis not present

## 2023-03-26 DIAGNOSIS — E1122 Type 2 diabetes mellitus with diabetic chronic kidney disease: Secondary | ICD-10-CM | POA: Diagnosis not present

## 2023-03-26 DIAGNOSIS — Z885 Allergy status to narcotic agent status: Secondary | ICD-10-CM | POA: Diagnosis not present

## 2023-03-26 DIAGNOSIS — R410 Disorientation, unspecified: Secondary | ICD-10-CM | POA: Diagnosis present

## 2023-03-26 DIAGNOSIS — Z7984 Long term (current) use of oral hypoglycemic drugs: Secondary | ICD-10-CM | POA: Diagnosis not present

## 2023-03-26 DIAGNOSIS — Z79899 Other long term (current) drug therapy: Secondary | ICD-10-CM | POA: Diagnosis not present

## 2023-03-26 DIAGNOSIS — G9341 Metabolic encephalopathy: Secondary | ICD-10-CM | POA: Diagnosis not present

## 2023-03-26 LAB — GLUCOSE, CAPILLARY
Glucose-Capillary: 105 mg/dL — ABNORMAL HIGH (ref 70–99)
Glucose-Capillary: 164 mg/dL — ABNORMAL HIGH (ref 70–99)
Glucose-Capillary: 275 mg/dL — ABNORMAL HIGH (ref 70–99)

## 2023-03-26 LAB — RPR: RPR Ser Ql: NONREACTIVE

## 2023-03-26 LAB — BASIC METABOLIC PANEL
Anion gap: 9 (ref 5–15)
BUN: 20 mg/dL (ref 8–23)
CO2: 26 mmol/L (ref 22–32)
Calcium: 8.9 mg/dL (ref 8.9–10.3)
Chloride: 103 mmol/L (ref 98–111)
Creatinine, Ser: 1.37 mg/dL — ABNORMAL HIGH (ref 0.61–1.24)
GFR, Estimated: 51 mL/min — ABNORMAL LOW (ref >60)
Glucose, Bld: 135 mg/dL — ABNORMAL HIGH (ref 70–99)
Potassium: 3.6 mmol/L (ref 3.5–5.1)
Sodium: 138 mmol/L (ref 135–145)

## 2023-03-26 LAB — HIV ANTIBODY (ROUTINE TESTING W REFLEX): HIV Screen 4th Generation wRfx: NONREACTIVE

## 2023-03-26 LAB — CBC
HCT: 51.9 % (ref 39.0–52.0)
Hemoglobin: 16.6 g/dL (ref 13.0–17.0)
MCH: 29.3 pg (ref 26.0–34.0)
MCHC: 32 g/dL (ref 30.0–36.0)
MCV: 91.7 fL (ref 80.0–100.0)
Platelets: 142 10*3/uL — ABNORMAL LOW (ref 150–400)
RBC: 5.66 MIL/uL (ref 4.22–5.81)
RDW: 14.7 % (ref 11.5–15.5)
WBC: 8.4 10*3/uL (ref 4.0–10.5)
nRBC: 0 % (ref 0.0–0.2)

## 2023-03-26 LAB — VITAMIN B12: Vitamin B-12: 213 pg/mL (ref 180–914)

## 2023-03-26 LAB — CBG MONITORING, ED: Glucose-Capillary: 84 mg/dL (ref 70–99)

## 2023-03-26 MED ORDER — ROSUVASTATIN CALCIUM 20 MG PO TABS
20.0000 mg | ORAL_TABLET | Freq: Every day | ORAL | Status: DC
  Administered 2023-03-27 – 2023-03-31 (×5): 20 mg via ORAL
  Filled 2023-03-26 (×5): qty 1

## 2023-03-26 NOTE — Consult Note (Signed)
Neurology Consultation Reason for Consult: AMS, stroke on MRI  Requesting Physician: Berton Mount  CC: Confusion, gait impairment   History is obtained from: Chart review, limited from patient given confusion, no family at bedside and unable to reach them overnight  HPI: Ryan Duke is a 82 y.o. 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  MRI was obtained which demonstrated stroke, within note documents patient was instructed to go to the ED for workup.  Subsequently 03/25/2023 patient was again having falls and disorientation, confusion and generalized weakness, and wife requested assistance calling 911 to arrange transport for the patient.  MRI demonstrated additional strokes for which neurology was consulted  LKW: 4/24 Thrombolytic given?: No, out of the window and recent stroke IA performed?: No, no LVO Premorbid modified rankin scale:      4 - Moderately severe disability. 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 Excell Seltzer and Dr. Laneta Simmers   Severe aortic stenosis    Past Surgical History:  Procedure Laterality Date   ALVEOLOPLASTY N/A  05/14/2022   Procedure: ALVEOLOPLASTY;  Surgeon: Sharman Cheek, DMD;  Location: [facility] OR;  Service: Dentistry;  Laterality: N/A;   CARDIAC CATHETERIZATION     EYE SURGERY  2021   cataracts   FLEXIBLE SIGMOIDOSCOPY N/A 09/10/2017   Procedure: FLEXIBLE SIGMOIDOSCOPY;  Surgeon: Napoleon Form, MD;  Location: [facility];  Service: Endoscopy;  Laterality: N/A;   IR KYPHO LUMBAR INC FX REDUCE BONE BX UNI/BIL CANNULATION INC/IMAGING  09/14/2017   IR RADIOLOGIST EVAL & MGMT  10/07/2017   RIGHT HEART CATH AND CORONARY ANGIOGRAPHY N/A 05/07/2022   Procedure: RIGHT HEART CATH AND CORONARY ANGIOGRAPHY;  Surgeon: Tonny Bollman, MD;  Location: [facility] INVASIVE CV LAB;  Service: Cardiovascular;  Laterality: N/A;   TEE WITHOUT CARDIOVERSION N/A 07/14/2022   Procedure: TRANSESOPHAGEAL ECHOCARDIOGRAM (TEE);  Surgeon: Tonny Bollman, MD;  Location[facility] INVASIVE CV LAB;  Service: Open Heart Surgery;  Laterality: N/A;   TONSILLECTOMY     TOOTH EXTRACTION N/A 05/14/2022   Procedure: DENTAL EXTRACTIONS TEETH NUMBER TWO, THREE, FOUR, FIVE, FOURTEEN, FIFTEEN, TWENTY;  Surgeon: Sharman Cheek, DMD;  Location: [facility] OR;  Service: Dentistry;  Laterality: N/A;   Current Outpatient Medications  Medication Instructions   amoxicillin (AMOXIL) 2,000 mg, Oral, As directed, 1 hour prior to dental work including cleanings   aspirin 81 mg, Oral, Daily   atenolol (TENORMIN) 50 mg, Oral, Daily   Continuous Blood Gluc Receiver (FREESTYLE LIBRE 2 READER) DEVI See admin instructions   Continuous Blood Gluc Sensor (FREESTYLE LIBRE 2 SENSOR) MISC USE AS DIRECTED AND REPLACE EVERY 14 DAYS.   cyanocobalamin (VITAMIN B12) 500 mcg, Oral, Daily   DULoxetine (CYMBALTA)  60 MG capsule TAKE ONE CAPSULE DAILY   fluticasone (FLONASE) 50 MCG/ACT nasal spray 1 spray, Each Nare, Daily PRN   Insulin Pen Needle (NOVOFINE) 32G X 6 MM MISC 1 Act, Does not apply, Daily   JARDIANCE 25 MG TABS tablet TAKE ONE TABLET BY MOUTH ONCE DAILY BEFORE  BREAKFAST   Kerendia 20 mg, Oral, Daily   Lantus SoloStar 30 Units, Subcutaneous, Daily   losartan (COZAAR) 100 mg, Oral, Daily, for high blood pressure   Multiple Vitamin (MULTIVITAMIN WITH MINERALS) TABS tablet 1 tablet, Oral, Daily   Multiple Vitamins-Minerals (PRESERVISION AREDS PO) 1 tablet, Oral, 2 times daily   MYRBETRIQ 50 MG TB24 tablet TAKE 1 TABLET ONCE DAILY.   pravastatin (PRAVACHOL) 40 MG tablet TAKE ONE TABLET BY MOUTH ONCE DAILY   pregabalin (LYRICA) 150 mg, Oral, See admin instructions, Take 150 mg daily, may take an additional 150 mg twice daily as needed for pain   tamsulosin (FLOMAX) 0.4 MG CAPS capsule TAKE ONE CAPSULE BY MOUTH DAILY     Family History  Problem Relation Age of Onset   Hypertension Other     Social History:  reports that he quit smoking about 48 years ago. His smoking use included cigarettes. He has never been exposed to tobacco smoke. He has never used smokeless tobacco. He reports that he does not currently use alcohol. He reports that he does not use drugs.   Exam: Current vital signs: BP (!) 191/77   Pulse 61   Temp 98.2 F (36.8 C)   Resp 18   Ht 5\' 11"  (1.803 m)   Wt 102.1 kg   SpO2 98%   BMI 31.38 kg/m  Vital signs in last 24 hours: Temp:  [97.4 F (36.3 C)-98.5 F (36.9 C)] 98.2 F (36.8 C) (04/26 0235) Pulse Rate:  [60-77] 61 (04/26 0430) Resp:  [16-27] 18 (04/26 0236) BP: (150-230)/(70-107) 191/77 (04/26 0529) SpO2:  [92 %-98 %] 98 % (04/26 0430) Weight:  [102.1 kg] 102.1 kg (04/25 1052)   Physical Exam  Constitutional: Appears well-developed and well-nourished.  Psych: Affect mildly confused but pleasant and cooperative Eyes: No scleral injection HENT: No oropharyngeal obstruction.  MSK: no joint deformities.  Cardiovascular: Perfusing extremities well Respiratory: Effort normal, non-labored breathing GI: Soft.  No distension. There is no tenderness.  Skin: Warm dry and intact visible skin  Neuro: Mental  Status: Patient is awake, alert, oriented to person, age, but had difficulty reporting month, unable to provide significant details about situation although he was able to state he had a stroke  No signs of aphasia or neglect Cranial Nerves: II: Visual Fields are full. Pupils are equal, round, and reactive to light.   III,IV, VI: EOMI without ptosis or diploplia.  Saccadic pursuits V: Facial sensation is symmetric to light touch VII: Facial movement is symmetric.  VIII: hearing is intact to voice X: Uvula elevates symmetrically XI: Shoulder shrug is symmetric. XII: tongue is midline without atrophy or fasciculations.  Motor: Mild paratonia.  5/5 strength was present in all four extremities.  Left upper extremity pronation with slight drift Sensory: Sensation is symmetric to light touch and temperature in the arms and legs. Deep Tendon Reflexes: 2+ and symmetric in the brachioradialis, patellar reflexes difficult to obtain due to inability of patient to relax Cerebellar: FNF intact bilaterally, heel-to-shin testing limited by mild motor apraxia  Gait:  Deferred in acute setting   NIHSS total 2 Score breakdown: 1 point for being unable to answer month correctly, 1  point for left upper extremity drift Performed at 5:30 AM   I have reviewed labs in epic and the results pertinent to this consultation are:  Basic Metabolic Panel: Recent Labs  Lab 03/25/23 1145 03/26/23 0500  NA 138 138  K 3.7 3.6  CL 103 103  CO2 25 26  GLUCOSE 119* 135*  BUN 22 20  CREATININE 1.34* 1.37*  CALCIUM 9.0 8.9    CBC: Recent Labs  Lab 03/25/23 1145 03/26/23 0500  WBC 8.1 8.4  NEUTROABS 5.8  --   HGB 16.5 16.6  HCT 53.0* 51.9  MCV 91.5 91.7  PLT 130* 142*    Coagulation Studies: No results for input(s): "LABPROT", "INR" in the last 72 hours.   Lab Results  Component Value Date   CHOL 156 03/25/2023   HDL 45 03/25/2023   LDLCALC 96 03/25/2023   LDLDIRECT 81.0 05/26/2018   TRIG 75  03/25/2023   CHOLHDL 3.5 03/25/2023   Lab Results  Component Value Date   HGBA1C 6.7 (H) 03/25/2023     I have reviewed the images obtained:  MRI brain 03/05/2023 personally reviewed, agree with radiology 1. 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 2. Progressive generalized atrophy and diffuse white matter disease. This likely reflects the sequela of chronic microvascular ischemia. 3. 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 4. The number of remote lacunar infarcts in the cerebellum bilaterally has increased. 5. Mild left sphenoid sinus disease.  MRI brain 03/25/2023 personally reviewed, agree with radiology 1. 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 2. Normal expected interval evolution of previously identified subcentimeter infarct at the right temporoccipital junction. No associated hemorrhage or mass effect. 3. Underlying age-appropriate cerebral volume loss with moderate chronic microvascular ischemic disease, with a few scattered remote lacunar infarcts involving the hemispheric cerebral white matter, deep gray nuclei, and cerebellum.   CTA head/neck 03/25/2023 1. Negative CTA for large vessel occlusion or other emergent finding. 2. Mild atheromatous disease for age without hemodynamically significant or correctable stenosis.  ECHO 08/19/2022  1. Left ventricular ejection fraction, by estimation, is 55 to 60%. The  left ventricle has normal function. The left ventricle has no regional  wall motion abnormalities. There is mild left ventricular hypertrophy.  Left ventricular diastolic parameters  are consistent with Grade I diastolic dysfunction (impaired relaxation).   2. Right ventricular systolic function is normal. The right ventricular  size  is normal. Tricuspid regurgitation signal is inadequate for assessing  PA pressure.   3. The mitral valve is normal in structure. No evidence of mitral valve  regurgitation. No evidence of mitral stenosis.   4. Bioprosthetic aortic valve s/p TAVR. 26 mm Edwards Sapien 3 Ultra  Resilia THV. Mean gradient 9 mmHg, EOA 1.94 cm^2. Mild perivalvular  regurgitation.   5. Aortic dilatation noted. There is mild dilatation of the ascending  aorta, measuring 39 mm.   6. The inferior vena cava is normal in size with greater than 50%  respiratory variability, suggesting right atrial pressure of 3 mmHg.   Impression: Patient with multiple small vessel disease risk factors presenting with multifocal strokes in multiple vascular territories though all are subcortical.  Etiology small vessel disease versus potentially cardioembolic or atheroembolic from his known significant aortic arch atherosclerotic disease.  Recommendations:  # Punctate strokes in multiple vascular territories - Stroke labs HgbA1c, fasting lipid panel -  CTA head and neck - Frequent neuro checks - Echocardiogram - Aspirin - dose 325mg  PO or 300mg  PR, followed by 81 mg daily - Consider Plavix 300 mg load with 75 mg daily for 21 day course if no indication for anticoagulation found and after risk/benefit discussion with family - LDL above goal on pravastatin 40 mg, allergy of headaches listed to atorvastatin, consider rosuvastatin 20 to meet goal LDL less than 70 (will need discussion with family) - Risk factor modification - Telemetry monitoring; 30 day event monitor on discharge if no arrythmias captured  - Blood pressure goal   - Normotension as he is out of the window for permissive hypertension - PT consult, OT consult, Speech consult, unless patient is back to baseline - Stroke team to follow  # Cognitive impairment -- there may be an underlying dementia but will screen for reversible etiologies -Routine EEG -HIV, RPR,  thiamine, B12  Brooke Dare MD-PhD [facility] [phone] Available 7 PM to 7 AM, outside of these hours please call Neurologist on call as listed on Amion.

## 2023-03-26 NOTE — Progress Notes (Signed)
PROGRESS NOTE    Ryan Duke ZOX:[id number] DOB: March 24, 1941 DOA: 03/25/2023  PCP: Ryan Grandchild, MD   Brief Narrative:  This 82 years old Male with PMH significant for insulin-dependent type 2 diabetes, CKD stage IIIa, severe aortic stenosis s/p TAVR, hypertension, hyperlipidemia, BPH presented to the ED for the evaluation of recurrent falls.  Patient reports recurrent fall for last 1 month,  states he lost his balance when walking resulting in falls.  Patient did have an outpatient MRI which showed 4 mm acute subcortical nonhemorrhagic white matter infarct in the posterior right temporal lobe.  Patient was advised to go to the ED after MRI result however he did not do so at the time. Patient had another fall last night and he was disoriented.  Patient takes aspirin 81 mg daily, he was previously on Plavix post-TAVR but taking aspirin at this time.  Workup in the ED: CT head without acute intracranial abnormality.  MRI brain shows acute ischemic nonhemorrhagic infarcts involving the anterior right corona radiata/basal ganglia.  Patient was admitted for stroke workup.  Neurology is consulted.  Assessment & Plan:   Principal Problem:   Acute CVA (cerebrovascular accident) (HCC) Active Problems:   Hyperlipidemia with target LDL less than 100   Essential hypertension   Chronic kidney disease, stage 3a (HCC)   Insulin-requiring or dependent type II diabetes mellitus (HCC)  Acute ischemic stroke: MRI brain showed multiple remote lacunar infarcts involving the hemispheric cerebral white matter, deep gray nuclei, and cerebellum . Neurology consulted.  Stroke workup. CTA head and neck negative for large vessel occlusion. Echocardiogram obtained report pending. Continue aspirin 81 mg daily Start Plavix and 5 mg daily Keep on telemetry, continue neurochecks PT/OT/SLP eval Hemoglobin A1c 6.7, LDL 96, Allow permissive hypertension. EEG: No seizure or epileptiform discharges seen.   Type 2  diabetes: Placed on SSI.  Globin A1c 6.7 well-controlled   Hypertension: Holding antihypertensives to allow for permissive hypertension.   CKD stage IIIa: Renal function stable.  Hyperlipidemia: Continue pravastatin  DVT prophylaxis: Lovenox Code Status: DNR Family Communication:  Wife at bed side. Disposition Plan:     Status is: Observation The patient remains OBS appropriate and will d/c before 2 midnights.   Admitted for CVA workup.  Consultants:  Neurology  Procedures: CT head, MRI   Antimicrobials: None Subjective: Patient was seen and examined at bedside.  Overnight events noted.   Patient reports feeling better,  he still has a lot of weakness which is causing him recurrent falls.  Objective: Vitals:   03/26/23 0731 03/26/23 0823 03/26/23 0902 03/26/23 1024  BP: (!) 169/93 (!) 168/92 (!) 156/93 (!) 198/85  Pulse: 69 69 68 69  Resp: 16 17 17  (!) 21  Temp: 97.6 F (36.4 C)  (!) 97.5 F (36.4 C) 97.7 F (36.5 C)  TempSrc: Oral  Oral Oral  SpO2: 91% 100% 100% 93%  Weight:      Height:        Intake/Output Summary (Last 24 hours) at 03/26/2023 1332 Last data filed at 03/26/2023 0002 Gross per 24 hour  Intake --  Output 500 ml  Net -500 ml   Filed Weights   03/25/23 1052  Weight: 102.1 kg    Examination:  General exam: Appears calm and comfortable, deconditioned, not in any distress Respiratory system: Clear to auscultation. Respiratory effort normal.  RR 15 Cardiovascular system: S1 & S2 heard, regular rate and rhythm, no murmur. Gastrointestinal system: Abdomen is soft, non tender, non distended, BS+  Central nervous system: Alert and oriented X3. No focal neurological deficits. Extremities: Symmetric 5 x 5 power. Skin: No rashes, lesions or ulcers Psychiatry: Judgement and insight appear normal. Mood & affect appropriate.     Data Reviewed: I have personally reviewed following labs and imaging studies  CBC: Recent Labs  Lab  03/25/23 1145 2023-04-19 0500  WBC 8.1 8.4  NEUTROABS 5.8  --   HGB 16.5 16.6  HCT 53.0* 51.9  MCV 91.5 91.7  PLT 130* 142*   Basic Metabolic Panel: Recent Labs  Lab 03/25/23 1145 04-19-23 0500  NA 138 138  K 3.7 3.6  CL 103 103  CO2 25 26  GLUCOSE 119* 135*  BUN 22 20  CREATININE 1.34* 1.37*  CALCIUM 9.0 8.9   GFR: Estimated Creatinine Clearance: 48.8 mL/min (A) (by C-G formula based on SCr of 1.37 mg/dL (H)). Liver Function Tests: Recent Labs  Lab 03/25/23 1145  AST 20  ALT 13  ALKPHOS 78  BILITOT 1.0  PROT 6.8  ALBUMIN 3.9   No results for input(s): "LIPASE", "AMYLASE" in the last 168 hours. Recent Labs  Lab 03/25/23 1146  AMMONIA 12   Coagulation Profile: No results for input(s): "INR", "PROTIME" in the last 168 hours. Cardiac Enzymes: No results for input(s): "CKTOTAL", "CKMB", "CKMBINDEX", "TROPONINI" in the last 168 hours. BNP (last 3 results) No results for input(s): "PROBNP" in the last 8760 hours. HbA1C: Recent Labs    03/25/23 1145  HGBA1C 6.7*   CBG: Recent Labs  Lab 03/25/23 1130 03/25/23 2204 04/19/23 0724 2023/04/19 1139  GLUCAP 101* 187* 84 105*   Lipid Profile: Recent Labs    03/25/23 1145  CHOL 156  HDL 45  LDLCALC 96  TRIG 75  CHOLHDL 3.5   Thyroid Function Tests: No results for input(s): "TSH", "T4TOTAL", "FREET4", "T3FREE", "THYROIDAB" in the last 72 hours. Anemia Panel: Recent Labs    19-Apr-2023 0606  VITAMINB12 213   Sepsis Labs: No results for input(s): "PROCALCITON", "LATICACIDVEN" in the last 168 hours.  No results found for this or any previous visit (from the past 240 hour(s)).   Radiology Studies: EEG adult  Result Date: 04-19-23 Ryan Quest, MD     04-19-2023 12:04 PM Patient Name: Ryan Duke MRN: [id number] Epilepsy Attending: [name] Referring Physician/Provider: Gordy Councilman, MD Date: Apr 19, 2023 Duration: 22.32 mins Patient history: 82yo M getting eeg to evaluate for seizure.  Level of alertness: Awake AEDs during EEG study: None Technical aspects: This EEG study was done with scalp electrodes positioned according to the 10-20 International system of electrode placement. Electrical activity was reviewed with band pass filter of 1-70Hz , sensitivity of 7 uV/mm, display speed of 54mm/sec with a 60Hz  notched filter applied as appropriate. EEG data were recorded continuously and digitally stored.  Video monitoring was available and reviewed as appropriate. Description: The posterior dominant rhythm consists of 7 Hz activity of moderate voltage (25-35 uV) seen predominantly in posterior head regions, symmetric and reactive to eye opening and eye closing. EEG showed continuous generalized 5 to 7 Hz theta slowing admixed with intermittent 2-3Hz  delta slowing. Hyperventilation and photic stimulation were not performed.   Of note, eeg was technically difficult due to significant electrical artifact. ABNORMALITY - Continuous slow, generalized - Background slow IMPRESSION: This technically difficult study is suggestive of moderate diffuse encephalopathy. No seizures or epileptiform discharges were seen throughout the recording. Ryan Duke   CT Angio Head W or Wo Contrast  Result Date: 03/25/2023 CLINICAL  DATA:  Initial evaluation for stroke. EXAM: CT ANGIOGRAPHY HEAD AND NECK TECHNIQUE: Multidetector CT imaging of the head and neck was performed using the standard protocol during bolus administration of intravenous contrast. Multiplanar CT image reconstructions and MIPs were obtained to evaluate the vascular anatomy. Carotid stenosis measurements (when applicable) are obtained utilizing NASCET criteria, using the distal internal carotid diameter as the denominator. RADIATION DOSE REDUCTION: This exam was performed according to the departmental dose-optimization program which includes automated exposure control, adjustment of the mA and/or kV according to patient size and/or use of iterative  reconstruction technique. CONTRAST:  OMNIPAQUE IOHEXOL 350 MG/ML SOLN COMPARISON:  MRI and CT from earlier the same day. FINDINGS: CTA NECK FINDINGS Aortic arch: Visualized aortic arch normal caliber with standard branch pattern. Moderate atheromatous change about the arch itself, with irregular soft plaque/thrombus protruding into the aortic lumen (series 504, image 23). Right carotid system: Right common and internal carotid arteries are patent without stenosis or dissection. Left carotid system: Left common and internal carotid arteries are patent without stenosis or dissection. Vertebral arteries: Both vertebral arteries arise from the subclavian arteries. No proximal subclavian artery stenosis. Mild atheromatous irregularity about the vertebral arteries without high-grade stenosis or dissection. Skeleton: no discrete or worrisome osseous lesions. Moderate degenerative spondylosis at C4-5 through C6-7. Other neck: No other acute soft tissue abnormality within the neck. Upper chest: Visualized upper chest demonstrates no acute finding. Review of the MIP images confirms the above findings CTA HEAD FINDINGS Anterior circulation: Mild atheromatous change within the carotid siphons without stenosis. A1 segments, anterior communicating complex common anterior cerebral arteries widely patent. No M1 stenosis or occlusion. No proximal MCA branch occlusion or stenosis. Distal MCA branches perfused and symmetric. Posterior circulation: Both V4 segments patent without stenosis. Both PICA patent. 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 Basilar diminutive but widely patent. Superior cerebral arteries patent bilaterally. Predominant fetal type origin of the PCAs. Both PCAs widely patent to their distal aspects without stenosis. Venous sinuses: Grossly patent allowing for timing the contrast bolus. Anatomic variants: As above.  No aneurysm. Review of the MIP images confirms the above findings  IMPRESSION: 1. Negative CTA for large vessel occlusion or other emergent finding. 2. Mild atheromatous disease for age without hemodynamically significant or correctable stenosis. Aortic Atherosclerosis (ICD10-I70.0). Electronically Signed   By: Rise Mu M.D.   On: 03/25/2023 22:09   CT Angio Neck W and/or Wo Contrast  Result Date: 03/25/2023 CLINICAL DATA:  Initial evaluation for stroke. EXAM: CT ANGIOGRAPHY HEAD AND NECK TECHNIQUE: Multidetector CT imaging of the head and neck was performed using the standard protocol during bolus administration of intravenous contrast. Multiplanar CT image reconstructions and MIPs were obtained to evaluate the vascular anatomy. Carotid stenosis measurements (when applicable) are obtained utilizing NASCET criteria, using the distal internal carotid diameter as the denominator. RADIATION DOSE REDUCTION: This exam was performed according to the departmental dose-optimization program which includes automated exposure control, adjustment of the mA and/or kV according to patient size and/or use of iterative reconstruction technique. CONTRAST:  OMNIPAQUE IOHEXOL 350 MG/ML SOLN COMPARISON:  MRI and CT from earlier the same day. FINDINGS: CTA NECK FINDINGS Aortic arch: Visualized aortic arch normal caliber with standard branch pattern. Moderate atheromatous change about the arch itself, with irregular soft plaque/thrombus protruding into the aortic lumen (series 504, image 23). Right carotid system: Right common and internal carotid arteries are patent without stenosis or dissection. Left carotid system: Left common and internal carotid arteries are  patent without stenosis or dissection. Vertebral arteries: Both vertebral arteries arise from the subclavian arteries. No proximal subclavian artery stenosis. Mild atheromatous irregularity about the vertebral arteries without high-grade stenosis or dissection. Skeleton: no discrete or worrisome osseous lesions. Moderate  degenerative spondylosis at C4-5 through C6-7. Other neck: No other acute soft tissue abnormality within the neck. Upper chest: Visualized upper chest demonstrates no acute finding. Review of the MIP images confirms the above findings CTA HEAD FINDINGS Anterior circulation: Mild atheromatous change within the carotid siphons without stenosis. A1 segments, anterior communicating complex common anterior cerebral arteries widely patent. No M1 stenosis or occlusion. No proximal MCA branch occlusion or stenosis. Distal MCA branches perfused and symmetric. Posterior circulation: Both V4 segments patent without stenosis. Both PICA patent. 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 Basilar diminutive but widely patent. Superior cerebral arteries patent bilaterally. Predominant fetal type origin of the PCAs. Both PCAs widely patent to their distal aspects without stenosis. Venous sinuses: Grossly patent allowing for timing the contrast bolus. Anatomic variants: As above.  No aneurysm. Review of the MIP images confirms the above findings IMPRESSION: 1. Negative CTA for large vessel occlusion or other emergent finding. 2. Mild atheromatous disease for age without hemodynamically significant or correctable stenosis. Aortic Atherosclerosis (ICD10-I70.0). Electronically Signed   By: Rise Mu M.D.   On: 03/25/2023 22:09   MR BRAIN WO CONTRAST  Result Date: 03/25/2023 CLINICAL DATA:  Initial evaluation for word-finding difficulty, recent stroke. EXAM: MRI HEAD WITHOUT CONTRAST TECHNIQUE: Multiplanar, multiecho pulse sequences of the brain and surrounding structures were obtained without intravenous contrast. COMPARISON:  CT from earlier same day as well as prior MRI from 03/05/2023. FINDINGS: Brain: Age-appropriate cerebral volume loss. Patchy T2/FLAIR hyperintensity involving the periventricular deep white matter both cerebral hemispheres, consistent with moderate chronic microvascular  ischemic disease. Few scatter remote lacunar infarcts present about the hemispheric cerebral white matter and deep gray nuclei. Few scattered small remote bilateral cerebellar infarcts noted. Patchy subcentimeter acute ischemic nonhemorrhagic infarct present at the anterior right frontal corona radiata/basal ganglia (series 9, image 32). Additional subcentimeter acute ischemic nonhemorrhagic subcortical infarct noted at the contralateral left frontal lobe (series 9, image 39). Normal expected interval evolution of previously identified subcentimeter infarct at the right temporal occipital junction (series 9, image 23). No associated hemorrhage or mass effect. No other evidence for acute or subacute ischemia. No acute or chronic intracranial blood products. No mass lesion, mass effect or midline shift. No hydrocephalus or extra-axial fluid collection. Pituitary gland and suprasellar region within normal limits. Vascular: Major intracranial vascular flow voids are maintained. Skull and upper cervical spine: Craniocervical junction within normal limits. Bone marrow signal intensity normal. No scalp soft tissue abnormality. Sinuses/Orbits: Prior bilateral ocular lens replacement. Paranasal sinuses are largely clear. No significant mastoid effusion. Other: None. IMPRESSION: 1. 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 2. Normal expected interval evolution of previously identified subcentimeter infarct at the right temporoccipital junction. No associated hemorrhage or mass effect. 3. Underlying age-appropriate cerebral volume loss with moderate chronic microvascular ischemic disease, with a few scattered remote lacunar infarcts involving the hemispheric cerebral white matter, deep gray nuclei, and cerebellum. Electronically Signed   By: Rise Mu M.D.   On: 03/25/2023 18:19   CT HEAD WO  CONTRAST  Result Date: 03/25/2023 CLINICAL DATA:  Fall EXAM: CT HEAD WITHOUT CONTRAST CT CERVICAL SPINE WITHOUT CONTRAST TECHNIQUE: Multidetector CT imaging of the head and cervical  spine was performed following the standard protocol without intravenous contrast. Multiplanar CT image reconstructions of the cervical spine were also generated. RADIATION DOSE REDUCTION: This exam was performed according to the departmental dose-optimization program which includes automated exposure control, adjustment of the mA and/or kV according to patient size and/or use of iterative reconstruction technique. COMPARISON:  None Available. FINDINGS: CT HEAD FINDINGS Brain: No evidence of acute infarction, hemorrhage, hydrocephalus, extra-axial collection or mass lesion/mass effect. There is sequela of moderate severe chronic microvascular ischemic change. Vascular: No hyperdense vessel or unexpected calcification. Skull: Normal. Negative for fracture or focal lesion. Sinuses/Orbits: No middle ear or mastoid effusion. Paranasal sinuses clear. Bilateral lens replacement. Orbits are otherwise unremarkable Other: None. CT CERVICAL SPINE FINDINGS Alignment: There is straightening of the normal cervical lordosis. Skull base and vertebrae: No acute fracture. No primary bone lesion or focal pathologic process. Soft tissues and spinal canal: No prevertebral fluid or swelling. No visible canal hematoma. 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 Other: None IMPRESSION: 1. No CT evidence of intracranial injury. Sequela of moderate to severe chronic microvascular ischemic change. 2. No acute fracture or traumatic malalignment of the cervical spine. 3. 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 Electronically Signed   By: Lorenza Cambridge M.D.   On: 03/25/2023 13:04   CT Cervical Spine Wo Contrast  Result Date: 03/25/2023 CLINICAL DATA:  Fall EXAM: CT HEAD WITHOUT CONTRAST CT CERVICAL SPINE WITHOUT  CONTRAST TECHNIQUE: Multidetector CT imaging of the head and cervical spine was performed following the standard protocol without intravenous contrast. Multiplanar CT image reconstructions of the cervical spine were also generated. RADIATION DOSE REDUCTION: This exam was performed according to the departmental dose-optimization program which includes automated exposure control, adjustment of the mA and/or kV according to patient size and/or use of iterative reconstruction technique. COMPARISON:  None Available. FINDINGS: CT HEAD FINDINGS Brain: No evidence of acute infarction, hemorrhage, hydrocephalus, extra-axial collection or mass lesion/mass effect. There is sequela of moderate severe chronic microvascular ischemic change. Vascular: No hyperdense vessel or unexpected calcification. Skull: Normal. Negative for fracture or focal lesion. Sinuses/Orbits: No middle ear or mastoid effusion. Paranasal sinuses clear. Bilateral lens replacement. Orbits are otherwise unremarkable Other: None. CT CERVICAL SPINE FINDINGS Alignment: There is straightening of the normal cervical lordosis. Skull base and vertebrae: No acute fracture. No primary bone lesion or focal pathologic process. Soft tissues and spinal canal: No prevertebral fluid or swelling. No visible canal hematoma. 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 Other: None IMPRESSION: 1. No CT evidence of intracranial injury. Sequela of moderate to severe chronic microvascular ischemic change. 2. No acute fracture or traumatic malalignment of the cervical spine. 3. 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 Electronically Signed   By: Lorenza Cambridge M.D.   On: 03/25/2023 13:04    Scheduled Meds:   stroke: early stages of recovery book   Does not apply Once   aspirin  81 mg Oral Daily   clopidogrel  75 mg Oral Daily   enoxaparin (LOVENOX) injection  40 mg Subcutaneous Q24H   insulin aspart  0-5 Units Subcutaneous QHS    insulin aspart  0-9 Units Subcutaneous TID WC   pravastatin  40 mg Oral Daily   tamsulosin  0.4 mg Oral Daily   Continuous Infusions:   LOS: 0 days    Time spent: 50 Mins.    Willeen Niece, MD [facility]   If 7PM-7AM,  please contact night-coverage

## 2023-03-26 NOTE — TOC Initial Note (Signed)
Transition of Care [facility]) - Initial/Assessment Note    Patient Details  Name: Ryan Duke MRN: [id number] Date of Birth: 1941-04-16  Transition of Care [facility]) CM/SW Contact:    Mearl Latin, LCSW Phone Number: 03/26/2023, 3:36 PM  Clinical Narrative:                 CSW received consult for possible SNF placement at time of discharge. CSW spoke with patient's spouse due to patient's disorientation. She reported that she is currently unable to care for patient at their home given patient's current physical needs and fall risk. She expressed understanding of PT recommendation and is agreeable to SNF placement at time of discharge. Patient reports preference for [facility]. CSW discussed insurance authorization process and will provide Medicare SNF ratings list. CSW will send out referrals for review and provide bed offers as available.   Skilled Nursing Rehab Facilities-   [email or web address]   Ratings out of 5 stars (5 the highest)   Name Address  Phone # Quality Care Staffing Health Inspection Overall  [facility] [address], [place] [phone] 1 1 5 3   [facility] [address], [place] [phone] 4 1 3 2   [facility] [address], [place]inette Otto [phone] 3 1 1 1   [facility] [address] [place] [phone] 3 1 3 2   [facility]  [address], [place] [phone] 4 2 5 5   [facility] [address] [facility] [phone] 2 1 2 1   [facility] [address] [place] [phone] 3 1 2 1   [facility] [address] [place] [phone] 3 4 3 3   [facility] [address], [place] [phone] 2 2 2 2   [facility] [address] [place], [place] [phone] 1 2 1 1   [facility]) [address], [place] [phone] 3 1 1 1   [facility] [address] Liliane Shi [phone] 5 2 4 5    [facility] [address] [place] [phone] 4 4 3 3           [facility] [address] [place] [phone]      [facility], [place] [address], [place] [phone] 1 1 2 1   [facility] [address], [place] [phone] 2 2 4 4   [facility] [address]ree Ditto [phone] 2 1 4 3   [facility] [address], [place] [phone] 3 3 3 3           [address] (no [facility]) [address], [place] [phone] 4 4 5 5   [facility] (No Hum[employer]) [address] [place] [phone] 3 1 4 3   [facility] [address], [place] [phone] 2 1 2 1   [facility] (No [facility]) [address] [place], [place] [phone] 5 5 5 5   [facility] [address] [facility] [phone] 2 3 5 5   [facility] [address], [place] [phone] 2 1 1 1   [place] [address] Lil[place] Shi [phone] 5 2 5 5   [facility]  [address], [place] [phone] 2 2 2 2   [facility] [address], [place] [phone] 3 2 1 1   [facility] [address] [place], [place] [phone] 2 2 3 3           [facility] [address] [place] [phone] 1 1 1 1   [facility] [address] Evlyn Clines  [phone] 2  3 4 4   [facility] (No Humana) [address] [place], [place] [phone] 2 2 3 3   [facility]) [address], [place]alita Levan [phone] 1 1 1 1   [facility] [address] [place] Levan [phone] 2 2 5 5   [facility] [address] [place], [place] [phone] 2 1 1 1           [facility] [address] [place], [place] [phone] 5 4 5 5   [facility])  [address] [place] [phone] 2 1 2 1   [facility]) [address], [place] [phone]  2 4 4   [facility] [address], [place] [phone] 3 4 4 4   [address] [place], [place]  [phone] 2 2 2 2   [facility]) [address] [place] [phone] 2 1 3 2      Expected Discharge Plan: Skilled Nursing Facility Barriers to Discharge: Continued Medical Work up, SNF Pending bed offer, Insurance Authorization   Patient Goals and CMS Choice Patient states their goals for this hospitalization and ongoing recovery are:: Rehab CMS Medicare.gov Compare Post Acute Care list provided to:: Patient Represenative (must comment) Choice offered to / list presented to : Spouse [place] ownership interest in [facility].provided to:: Spouse    Expected Discharge Plan and Services In-house Referral: Clinical Social Work   Post Acute Care Choice: Skilled Nursing Facility Living arrangements for the past 2 months: Single Family Home                                      Prior Living Arrangements/Services Living arrangements for the past 2 months: Single Family Home Lives with:: Spouse Patient language and need for interpreter reviewed:: Yes Do you feel safe going back to the place where you live?: Yes      Need for Family Participation in Patient Care: Yes (Comment) Care giver support system in place?: Yes (comment) Current home services: DME Criminal Activity/Legal Involvement Pertinent to Current Situation/Hospitalization: No - Comment as needed  Activities of Daily Living      Permission Sought/Granted Permission sought to share information with : Facility Medical sales representative, Family Supports Permission granted to share information with : No  Share Information with NAME: Debarah Crape  Permission granted to share info w AGENCY: SNFs  Permission granted to share info w Relationship: Spouse  Permission granted to share info w Contact Information: [phone]  Emotional Assessment Appearance:: Appears stated age Attitude/Demeanor/Rapport: Unable to Assess Affect (typically observed): Unable to Assess Orientation:  : Oriented to Place, Oriented to Situation Alcohol / Substance Use: Not Applicable Psych Involvement: No (comment)  Admission diagnosis:  Disorientation [R41.0] Fall, initial encounter [W19.XXXA] Acute CVA (cerebrovascular accident) [facility]) [I63.9] 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) [I63.10] Patient Active Problem List   Diagnosis Date Noted   Acute CVA (cerebrovascular accident) (HCC) 03/25/2023   Ataxia 03/04/2023   Bradycardia 12/17/2022   Insulin-requiring or dependent type II diabetes mellitus (HCC) 09/15/2022   Encounter for general adult medical examination with abnormal findings 09/15/2022   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/14/2022   Severe aortic stenosis    Onychomycosis of toenail 12/04/2019   OAB (overactive bladder) 06/08/2019   Vitamin D deficiency 02/15/2018   Bilateral low back pain with sciatica 11/01/2017   Mild intermittent asthma without complication 12/28/2016  Primary gout 07/07/2016   Routine general medical examination at a health care facility 09/18/2011   GERD 07/22/2010   BPH (benign prostatic hyperplasia) 09/02/2009   Chronic kidney disease, stage 3a (HCC) 02/26/2009   Aortic valve disorder 02/25/2009   PERIPHERAL VASCULAR DISEASE 02/25/2009   Hyperlipidemia with target LDL less than 100 07/01/2007   Morbid obesity (HCC) 07/01/2007   Essential hypertension 07/01/2007   PCP:  [name], MD Pharmacy:   [facility] [place], [place] - [address] [facility] [address] [place] [address] Phone: [phone] Fax: [phone]     Social Determinants of Health (SDOH) Social History: SDOH Screenings   Food Insecurity: No Food Insecurity (03/02/2022)  Housing: Low Risk  (03/02/2022)  Transportation Needs: No Transportation Needs (03/02/2022)  Alcohol Screen: Low Risk  (03/02/2022)  Depression (PHQ2-9): Low Risk  (09/03/2022)  Financial Resource Strain:  Low Risk  (03/02/2022)  Physical Activity: Insufficiently Active (03/02/2022)  Social Connections: Moderately Isolated (03/02/2022)  Stress: No Stress Concern Present (03/02/2022)  Tobacco Use: Medium Risk (03/25/2023)   SDOH Interventions:     Readmission Risk Interventions    07/15/2022   11:05 AM  Readmission Risk Prevention Plan  Post Dischage Appt Complete  Medication Screening Complete  Transportation Screening Complete

## 2023-03-26 NOTE — Procedures (Signed)
Patient Name: DEJEAN TRIBBY  MRN: [id number]  Epilepsy Attending: Charlsie Quest  Referring Physician/Provider: Gordy Councilman, MD  Date: 03/26/2023 Duration: 22.32 mins  Patient history: 82yo M getting eeg to evaluate for seizure.  Level of alertness: Awake  AEDs during EEG study: None  Technical aspects: This EEG study was done with scalp electrodes positioned according to the 10-20 International system of electrode placement. Electrical activity was reviewed with band pass filter of 1-70Hz , sensitivity of 7 uV/mm, display speed of 76mm/sec with a 60Hz  notched filter applied as appropriate. EEG data were recorded continuously and digitally stored.  Video monitoring was available and reviewed as appropriate.  Description: The posterior dominant rhythm consists of 7 Hz activity of moderate voltage (25-35 uV) seen predominantly in posterior head regions, symmetric and reactive to eye opening and eye closing. EEG showed continuous generalized 5 to 7 Hz theta slowing admixed with intermittent 2-3Hz  delta slowing. Hyperventilation and photic stimulation were not performed.     Of note, eeg was technically difficult due to significant electrical artifact.   ABNORMALITY - Continuous slow, generalized - Background slow  IMPRESSION: This technically difficult study is suggestive of moderate diffuse encephalopathy. No seizures or epileptiform discharges were seen throughout the recording.  Arlicia Paquette Annabelle Harman

## 2023-03-26 NOTE — ED Notes (Signed)
[facility] en route.  °

## 2023-03-26 NOTE — Care Management Obs Status (Signed)
MEDICARE OBSERVATION STATUS NOTIFICATION   Patient Details  Name: KHAMBREL AMSDEN MRN: [id number] Date of Birth: 1941-09-15   Medicare Observation Status Notification Given:  Yes    Mearl Latin, LCSW 03/26/2023, 3:28 PM

## 2023-03-26 NOTE — ED Notes (Signed)
Pt in bed, pt voided in bed, pt has been scratching his buttocks, pt has wound on bilateral buttocks, pt has some open wounds from his R buttock, cleaned pt, padded dressings placed, bedding changed, gown changed.  Pt confused but redirectable.  Pt oriented to person, place and month, doesn't know day of the week or year. Re oriented pt, sitter with pt

## 2023-03-26 NOTE — Progress Notes (Signed)
EEG complete - results pending 

## 2023-03-26 NOTE — NC FL2 (Signed)
[place] MEDICAID FL2 LEVEL OF CARE FORM     IDENTIFICATION  Patient Name: Ryan Duke Birthdate: Jun 14, 1941 Sex: male Admission Date (Current Location): 03/25/2023  [facility] and [place] Number:  Producer, television/film/video and Address:  [facility], [address], [place], [place] [address]      Provider Number: [id number]  Attending Physician Name and Address:  Willeen Niece, MD  Relative Name and Phone Number:       Current Level of Care: Hospital Recommended Level of Care: Skilled Nursing Facility Prior Approval Number:    Date Approved/Denied:   PASRR Number: [id number]  Discharge Plan: SNF    Current Diagnoses: Patient Active Problem List   Diagnosis Date Noted   Acute CVA (cerebrovascular accident) (HCC) 03/25/2023   Ataxia 03/04/2023   Bradycardia 12/17/2022   Insulin-requiring or dependent type II diabetes mellitus (HCC) 09/15/2022   Encounter for general adult medical examination with abnormal findings 09/15/2022   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/14/2022   Severe aortic stenosis    Onychomycosis of toenail 12/04/2019   OAB (overactive bladder) 06/08/2019   Vitamin D deficiency 02/15/2018   Bilateral low back pain with sciatica 11/01/2017   Mild intermittent asthma without complication 12/28/2016   Primary gout 07/07/2016   Routine general medical examination at a health care facility 09/18/2011   GERD 07/22/2010   BPH (benign prostatic hyperplasia) 09/02/2009   Chronic kidney disease, stage 3a (HCC) 02/26/2009   Aortic valve disorder 02/25/2009   PERIPHERAL VASCULAR DISEASE 02/25/2009   Hyperlipidemia with target LDL less than 100 07/01/2007   Morbid obesity (HCC) 07/01/2007   Essential hypertension 07/01/2007    Orientation RESPIRATION BLADDER Height & Weight     Situation, Place  Normal Incontinent Weight: 225 lb (102.1 kg) Height:  5\' 11"  (180.3 cm)  BEHAVIORAL SYMPTOMS/MOOD NEUROLOGICAL BOWEL  NUTRITION STATUS      Continent Diet (See dc summary)  AMBULATORY STATUS COMMUNICATION OF NEEDS Skin   Limited Assist Verbally PU Stage and Appropriate Care (Stage II on buttocks)                       Personal Care Assistance Level of Assistance  Bathing, Feeding, Dressing Bathing Assistance: Limited assistance Feeding assistance: Limited assistance Dressing Assistance: Limited assistance     Functional Limitations Info             SPECIAL CARE FACTORS FREQUENCY  PT (By licensed PT), OT (By licensed OT)     PT Frequency: 5x/week OT Frequency: 5x/week            Contractures Contractures Info: Not present    Additional Factors Info  Code Status, Allergies, Insulin Sliding Scale Code Status Info: DNR Allergies Info: Farxiga (Dapagliflozin), Metformin And Related, Atorvastatin, Benazepril Hcl, Flexeril (Cyclobenzaprine), Oxycodone   Insulin Sliding Scale Info: See dc summary       Current Medications (03/26/2023):  This is the current hospital active medication list Current Facility-Administered Medications  Medication Dose Route Frequency Provider Last Rate Last Admin    stroke: early stages of recovery book   Does not apply Once Charlsie Quest, MD       acetaminophen (TYLENOL) tablet 650 mg  650 mg Oral Q4H PRN Charlsie Quest, MD       Or   acetaminophen (TYLENOL) 160 MG/5ML solution 650 mg  650 mg Per Tube Q4H PRN Charlsie Quest, MD       Or  acetaminophen (TYLENOL) suppository 650 mg  650 mg Rectal Q4H PRN Charlsie Quest, MD       aspirin chewable tablet 81 mg  81 mg Oral Daily Charlsie Quest, MD   81 mg at 03/26/23 1239   clopidogrel (PLAVIX) tablet 75 mg  75 mg Oral Daily Darreld Mclean R, MD   75 mg at 03/26/23 1238   enoxaparin (LOVENOX) injection 40 mg  40 mg Subcutaneous Q24H Darreld Mclean R, MD   40 mg at 03/25/23 2206   hydrALAZINE (APRESOLINE) injection 10 mg  10 mg Intravenous Q4H PRN Charlsie Quest, MD   10 mg at 03/26/23 0529   insulin  aspart (novoLOG) injection 0-5 Units  0-5 Units Subcutaneous QHS Patel, Vishal R, MD       insulin aspart (novoLOG) injection 0-9 Units  0-9 Units Subcutaneous TID WC Patel, Vishal R, MD       pravastatin (PRAVACHOL) tablet 40 mg  40 mg Oral Daily Darreld Mclean R, MD   40 mg at 03/26/23 1238   senna-docusate (Senokot-S) tablet 1 tablet  1 tablet Oral QHS PRN Charlsie Quest, MD       tamsulosin (FLOMAX) capsule 0.4 mg  0.4 mg Oral Daily Darreld Mclean R, MD   0.4 mg at 03/26/23 1239     Discharge Medications: Please see discharge summary for a list of discharge medications.  Relevant Imaging Results:  Relevant Lab Results:   Additional Information SN: [id number]  Renne Crigler Taira Knabe, LCSW

## 2023-03-26 NOTE — Evaluation (Signed)
Physical Therapy Evaluation Patient Details Name: Ryan Duke MRN: [id number] DOB: February 05, 1941 Today's Date: 03/26/2023  History of Present Illness  Pt is 82 year old [facility]  [facility]23 for AMS and frequent falls. Pt with recent rt temporal lobe infarct (outpt MRI 4/5). MRI on 4/25 showed patchy subcentimeter acute ischemic nonhemorrhagic infarcts involving the anterior right corona radiata/basal ganglia and contralateral subcortical left frontal lobe. Pt transferred to [facility] on 03/26/23.   PMH - HTN, DM, ckd, obesity, TAVR  Clinical Impression  Pt presents to PT with significant decrease in mobility due to generalized weakness and impaired cognition. Pt has been living at home with his wife. Wife uses a rollator and cannot physically assist pt with mobility. Currently pt requiring assist for all mobility. Feel like he will need more extended rehab so will benefit from continued post acute follow up therapy, <3 hours/day        Recommendations for follow up therapy are one component of a multi-disciplinary discharge planning process, led by the attending physician.  Recommendations may be updated based on patient status, additional functional criteria and insurance authorization.  Follow Up Recommendations Can patient physically be transported by private vehicle: No     Assistance Recommended at Discharge Frequent or constant Supervision/Assistance  Patient can return home with the following  A lot of help with walking and/or transfers;A lot of help with bathing/dressing/bathroom;Assistance with cooking/housework;Direct supervision/assist for medications management;Direct supervision/assist for financial management;Assist for transportation;Help with stairs or ramp for entrance    Equipment Recommendations Wheelchair (measurements PT);Wheelchair cushion (measurements PT)  Recommendations for Other Services       Functional Status Assessment Patient has had a recent decline in  their functional status and demonstrates the ability to make significant improvements in function in a reasonable and predictable amount of time.     Precautions / Restrictions        Mobility  Bed Mobility Overal bed mobility: Needs Assistance Bed Mobility: Supine to Sit, Sit to Supine     Supine to sit: Mod assist, HOB elevated Sit to supine: Mod assist, HOB elevated   General bed mobility comments: Assist to bring legs off, elevate trunk into sitting and bring hips to EOB. Assist to bring legs back up into bed. More of a processing issue than strength issue.    Transfers Overall transfer level: Needs assistance Equipment used: Rolling walker (2 wheels) Transfers: Sit to/from Stand Sit to Stand: Mod assist           General transfer comment: Assist to power up and for balance.    Ambulation/Gait             Pre-gait activities: Sidestepped up side of bed to [facility] with min assist and max verbal/tactile cuing    Stairs            Wheelchair Mobility    Modified Rankin (Stroke Patients Only)       Balance Overall balance assessment: Needs assistance Sitting-balance support: No upper extremity supported, Feet supported Sitting balance-Leahy Scale: Fair     Standing balance support: Bilateral upper extremity supported, During functional activity, Reliant on assistive device for balance Standing balance-Leahy Scale: Poor Standing balance comment: walker and min assist                             Pertinent Vitals/Pain Pain Assessment Pain Assessment: No/denies pain    Home Living Family/patient expects to be discharged  to:: Private residence Living Arrangements: Spouse/significant other Available Help at Discharge: Family;Available 24 hours/day (wife uses rollator. Can't physically assist) Type of Home: House Home Access: Stairs to enter Entrance Stairs-Rails: Right;Left Entrance Stairs-Number of Steps: 3-4 Alternate Level  Stairs-Number of Steps: flight Home Layout: Two level;Bed/bath upstairs;1/2 bath on main level Home Equipment: Rollator (4 wheels);Rolling Walker (2 wheels);Shower seat Additional Comments: difficulty describing home equipment    Prior Function Prior Level of Function : Independent/Modified Independent;Driving;History of Falls (last six months)             Mobility Comments: Uses rollator. Recent falls       Hand Dominance   Dominant Hand: Right    Extremity/Trunk Assessment   Upper Extremity Assessment Upper Extremity Assessment: Defer to OT evaluation    Lower Extremity Assessment Lower Extremity Assessment: Generalized weakness       Communication   Communication: Expressive difficulties  Cognition Arousal/Alertness: Awake/alert Behavior During Therapy: WFL for tasks assessed/performed Overall Cognitive Status: Impaired/Different from baseline Area of Impairment: Attention, Following commands, Safety/judgement, Awareness, Problem solving, Orientation                 Orientation Level: Disoriented to, Time, Situation Current Attention Level: Sustained   Following Commands: Follows one step commands inconsistently, Follows one step commands with increased time Safety/Judgement: Decreased awareness of safety, Decreased awareness of deficits Awareness: Intellectual Problem Solving: Slow processing, Decreased initiation, Difficulty sequencing, Requires verbal cues, Requires tactile cues General Comments: Pt may have receptive communication problems impacting cognition as well.        General Comments      Exercises     Assessment/Plan    PT Assessment Patient needs continued PT services  PT Problem List Decreased strength;Decreased balance;Decreased mobility;Decreased cognition;Decreased knowledge of use of DME;Decreased safety awareness       PT Treatment Interventions DME instruction;Gait training;Functional mobility training;Therapeutic  activities;Therapeutic exercise;Balance training;Cognitive remediation;Patient/family education    PT Goals (Current goals can be found in the Care Plan section)  Acute Rehab PT Goals Patient Stated Goal: not stated PT Goal Formulation: With patient/family Time For Goal Achievement: 04/09/23 Potential to Achieve Goals: Good    Frequency Min 3X/week     Co-evaluation               AM-PAC PT "6 Clicks" Mobility  Outcome Measure Help needed turning from your back to your side while in a flat bed without using bedrails?: A Lot Help needed moving from lying on your back to sitting on the side of a flat bed without using bedrails?: A Lot Help needed moving to and from a bed to a chair (including a wheelchair)?: A Lot Help needed standing up from a chair using your arms (e.g., wheelchair or bedside chair)?: A Lot Help needed to walk in hospital room?: Total Help needed climbing 3-5 steps with a railing? : Total 6 Click Score: 10    End of Session Equipment Utilized During Treatment: Gait belt Activity Tolerance: Patient tolerated treatment well Patient left: in bed;with call bell/phone within reach;with chair alarm set;with family/visitor present Nurse Communication: Mobility status;Other (comment) (need for condom cath) PT Visit Diagnosis: Unsteadiness on feet (R26.81);Other abnormalities of gait and mobility (R26.89);Other symptoms and signs involving the nervous system (R29.898);History of falling (Z91.81);Repeated falls (R29.6);Muscle weakness (generalized) (M62.81)    Time: 0981-1914 PT Time Calculation (min) (ACUTE ONLY): 40 min   Charges:   PT Evaluation $PT Eval Moderate Complexity: 1 Mod PT Treatments $Therapeutic Activity: 23-37 mins  [facility] PT Acute Rehabilitation Services Office [phone]   Angelina Ok [facility] 03/26/2023, 1:57 PM

## 2023-03-26 NOTE — ED Notes (Signed)
ED TO INPATIENT HANDOFF REPORT  ED Nurse Name and Phone #: Digna Countess  S Name/Age/Gender Ryan Duke 82 y.o. male Room/Bed: WA16/WA16  Code Status   Code Status: DNR  Home/SNF/Other Home Patient oriented to: self, place, situation, time Is this baseline? Yes   Triage Complete: Triage complete  Chief Complaint Acute CVA (cerebrovascular accident) [facility]) [I63.9]  Triage Note Pt BIB EMS from home. Pt baseline is A&Ox4, pt wife c/o altered mental status x1 month. EMS reports strong urine odor. Pt is diabetic. CBG 129.  EMS reports pt is afebrile, A&Ox1. VSS.   18G L AC   Allergies Allergies  Allergen Reactions   Farxiga [Dapagliflozin] Other (See Comments)    Gout    Metformin And Related Other (See Comments)    "Acidosis"   Atorvastatin Other (See Comments)    Headaches    Benazepril Hcl Other (See Comments)    Was not effective   Flexeril [Cyclobenzaprine] Other (See Comments)    Hallucinations    Oxycodone Other (See Comments)    Hallucinations     Level of Care/Admitting Diagnosis ED Disposition     ED Disposition  Admit   Condition  --   Comment  Hospital Area: [facility] [[id number]]  Level of Care: Telemetry Medical [104]  May place patient in observation at [facility] or [facility] if equivalent level of care is available:: No  Covid Evaluation: Asymptomatic - no recent exposure (last 10 days) testing not required  Diagnosis: Acute CVA (cerebrovascular accident) [facility]) [[id number]]  Admitting Physician: Charlsie Quest [[id number]]  Attending Physician: Charlsie Quest [[id number]]          B Medical/Surgery History Past Medical History:  Diagnosis Date   Diabetes mellitus    type 2   Heart murmur    Hyperlipidemia    Hypertension    Hypogonadism, male    Pancreatitis 11/30/2006   Renal insufficiency    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 Excell Seltzer and Dr. Laneta Simmers    Severe aortic stenosis    Past Surgical History:  Procedure Laterality Date   ALVEOLOPLASTY N/A 05/14/2022   Procedure: ALVEOLOPLASTY;  Surgeon: Sharman Cheek, DMD;  Location: [facility] OR;  Service: Dentistry;  Laterality: N/A;   CARDIAC CATHETERIZATION     EYE SURGERY  2021   cataracts   FLEXIBLE SIGMOIDOSCOPY N/A 09/10/2017   Procedure: FLEXIBLE SIGMOIDOSCOPY;  Surgeon: Napoleon Form, MD;  Location: [facility] ENDOSCOPY;  Service: Endoscopy;  Laterality: N/A;   IR KYPHO LUMBAR INC FX REDUCE BONE BX UNI/BIL CANNULATION INC/IMAGING  09/14/2017   IR RADIOLOGIST EVAL & MGMT  10/07/2017   RIGHT HEART CATH AND CORONARY ANGIOGRAPHY N/A 05/07/2022   Procedure: RIGHT HEART CATH AND CORONARY ANGIOGRAPHY;  Surgeon: Tonny Bollman, MD;  Location: [facility] INVASIVE CV LAB;  Service: Cardiovascular;  Laterality: N/A;   TEE WITHOUT CARDIOVERSION N/A 07/14/2022   Procedure: TRANSESOPHAGEAL ECHOCARDIOGRAM (TEE);  Surgeon: Tonny Bollman, MD;  Location: [facility] INVASIVE CV LAB;  Service: Open Heart Surgery;  Laterality: N/A;   TONSILLECTOMY     TOOTH EXTRACTION N/A 05/14/2022   Procedure: DENTAL EXTRACTIONS TEETH NUMBER TWO, THREE, FOUR, FIVE, FOURTEEN, FIFTEEN, TWENTY;  Surgeon: Sharman Cheek, DMD;  Location: [facility] OR;  Service: Dentistry;  Laterality: N/A;     A IV Location/Drains/Wounds Patient Lines/Drains/Airways Status     Active Line/Drains/Airways     Name Placement date Placement time Site Days  Peripheral IV 03/25/23 20 G 1" Anterior;Distal;Left;Upper Arm 03/25/23  1400  Arm  1   Pressure Injury 03/25/23 Buttocks Left;Right Stage 2 -  Partial thickness loss of dermis presenting as a shallow open injury with a red, pink wound bed without slough. 03/25/23  2300  -- 1            Intake/Output Last 24 hours  Intake/Output Summary (Last 24 hours) at 03/26/2023 0840 Last data filed at 03/26/2023 0002 Gross per 24 hour  Intake --  Output 500 ml  Net -500 ml    Labs/Imaging Results for orders  placed or performed during the hospital encounter of 03/25/23 (from the past 48 hour(s))  CBG monitoring, ED     Status: Abnormal   Collection Time: 03/25/23 11:30 AM  Result Value Ref Range   Glucose-Capillary 101 (H) 70 - 99 mg/dL    Comment: Glucose reference range applies only to samples taken after fasting for at least 8 hours.  Comprehensive metabolic panel     Status: Abnormal   Collection Time: 03/25/23 11:45 AM  Result Value Ref Range   Sodium 138 135 - 145 mmol/L   Potassium 3.7 3.5 - 5.1 mmol/L   Chloride 103 98 - 111 mmol/L   CO2 25 22 - 32 mmol/L   Glucose, Bld 119 (H) 70 - 99 mg/dL    Comment: Glucose reference range applies only to samples taken after fasting for at least 8 hours.   BUN 22 8 - 23 mg/dL   Creatinine, Ser 2.13 (H) 0.61 - 1.24 mg/dL   Calcium 9.0 8.9 - 08.6 mg/dL   Total Protein 6.8 6.5 - 8.1 g/dL   Albumin 3.9 3.5 - 5.0 g/dL   AST 20 15 - 41 U/L   ALT 13 0 - 44 U/L   Alkaline Phosphatase 78 38 - 126 U/L   Total Bilirubin 1.0 0.3 - 1.2 mg/dL   GFR, Estimated 52 (L) >60 mL/min    Comment: (NOTE) Calculated using the CKD-EPI Creatinine Equation (2021)    Anion gap 10 5 - 15    Comment: Performed at [facility], [address] [place], [place] [address]  Ethanol     Status: None   Collection Time: 03/25/23 11:45 AM  Result Value Ref Range   Alcohol, Ethyl (B) <10 <10 mg/dL    Comment: (NOTE) Lowest detectable limit for serum alcohol is 10 mg/dL.  For medical purposes only. Performed at [facility], [address] [place], [place] [address]   CBC WITH DIFFERENTIAL     Status: Abnormal   Collection Time: 03/25/23 11:45 AM  Result Value Ref Range   WBC 8.1 4.0 - 10.5 K/uL   RBC 5.79 4.22 - 5.81 MIL/uL   Hemoglobin 16.5 13.0 - 17.0 g/dL   HCT 28.4 (H) 13.2 - 44.0 %   MCV 91.5 80.0 - 100.0 fL   MCH 28.5 26.0 - 34.0 pg   MCHC 31.1 30.0 - 36.0 g/dL   RDW 10.2 72.5 - 36.6 %   Platelets 130 (L) 150 - 400  K/uL   nRBC 0.0 0.0 - 0.2 %   Neutrophils Relative % 71 %   Neutro Abs 5.8 1.7 - 7.7 K/uL   Lymphocytes Relative 17 %   Lymphs Abs 1.4 0.7 - 4.0 K/uL   Monocytes Relative 7 %   Monocytes Absolute 0.6 0.1 - 1.0 K/uL   Eosinophils Relative 3 %   Eosinophils Absolute 0.2 0.0 - 0.5  K/uL   Basophils Relative 1 %   Basophils Absolute 0.1 0.0 - 0.1 K/uL   Immature Granulocytes 1 %   Abs Immature Granulocytes 0.05 0.00 - 0.07 K/uL    Comment: Performed at [facility], [address] [place], [place] [address]  Lipid panel     Status: None   Collection Time: 03/25/23 11:45 AM  Result Value Ref Range   Cholesterol 156 0 - 200 mg/dL   Triglycerides 75 <010 mg/dL   HDL 45 >27 mg/dL   Total CHOL/HDL Ratio 3.5 RATIO   VLDL 15 0 - 40 mg/dL   LDL Cholesterol 96 0 - 99 mg/dL    Comment:        Total Cholesterol/HDL:CHD Risk Coronary Heart Disease Risk Table                     Men   Women  1/2 Average Risk   3.4   3.3  Average Risk       5.0   4.4  2 X Average Risk   9.6   7.1  3 X Average Risk  23.4   11.0        Use the calculated Patient Ratio above and the CHD Risk Table to determine the patient's CHD Risk.        ATP III CLASSIFICATION (LDL):  <100     mg/dL   Optimal  253-664  mg/dL   Near or Above                    Optimal  130-159  mg/dL   Borderline  403-474  mg/dL   High  >259     mg/dL   Very High Performed at [facility], [address] [place], [place] [address]   Hemoglobin A1c     Status: Abnormal   Collection Time: 03/25/23 11:45 AM  Result Value Ref Range   Hgb A1c MFr Bld 6.7 (H) 4.8 - 5.6 %    Comment: (NOTE) Pre diabetes:          5.7%-6.4%  Diabetes:              >6.4%  Glycemic control for   <7.0% adults with diabetes    Mean Plasma Glucose 145.59 mg/dL    Comment: Performed at [facility], [address], [place], [place] [address]  Ammonia     Status: None   Collection Time: 03/25/23 11:46 AM   Result Value Ref Range   Ammonia 12 9 - 35 umol/L    Comment: Performed at [facility], [address], [place], [place] [address]  Blood gas, venous     Status: Abnormal   Collection Time: 03/25/23 12:54 PM  Result Value Ref Range   pH, Ven 7.4 7.25 - 7.43   pCO2, Ven 47 44 - 60 mmHg   pO2, Ven 40 32 - 45 mmHg   Bicarbonate 29.1 (H) 20.0 - 28.0 mmol/L   Acid-Base Excess 3.5 (H) 0.0 - 2.0 mmol/L   O2 Saturation 70.4 %   Patient temperature 37.0     Comment: Performed at [facility], [address] [place], [place] [address]  Urine rapid drug screen (hosp performed)     Status: None   Collection Time: 03/25/23  1:50 PM  Result Value Ref Range   Opiates NONE DETECTED NONE DETECTED   Cocaine NONE DETECTED NONE DETECTED   Benzodiazepines  NONE DETECTED NONE DETECTED   Amphetamines NONE DETECTED NONE DETECTED   Tetrahydrocannabinol NONE DETECTED NONE DETECTED   Barbiturates NONE DETECTED NONE DETECTED    Comment: (NOTE) DRUG SCREEN FOR MEDICAL PURPOSES ONLY.  IF CONFIRMATION IS NEEDED FOR ANY PURPOSE, NOTIFY LAB WITHIN 5 DAYS.  LOWEST DETECTABLE LIMITS FOR URINE DRUG SCREEN Drug Class                     Cutoff (ng/mL) Amphetamine and metabolites    1000 Barbiturate and metabolites    200 Benzodiazepine                 200 Opiates and metabolites        300 Cocaine and metabolites        300 THC                            50 Performed at [facility], [address] [place], [place] [address]   Urinalysis, Routine w reflex microscopic -Urine, Catheterized     Status: Abnormal   Collection Time: 03/25/23  1:50 PM  Result Value Ref Range   Color, Urine YELLOW YELLOW   APPearance CLEAR CLEAR   Specific Gravity, Urine 1.014 1.005 - 1.030   pH 6.0 5.0 - 8.0   Glucose, UA >=500 (A) NEGATIVE mg/dL   Hgb urine dipstick NEGATIVE NEGATIVE   Bilirubin Urine NEGATIVE NEGATIVE   Ketones, ur 5 (A) NEGATIVE mg/dL   Protein,  ur >=604 (A) NEGATIVE mg/dL   Nitrite NEGATIVE NEGATIVE   Leukocytes,Ua NEGATIVE NEGATIVE   RBC / HPF 0-5 0 - 5 RBC/hpf   WBC, UA 0-5 0 - 5 WBC/hpf   Bacteria, UA NONE SEEN NONE SEEN   Squamous Epithelial / HPF 0-5 0 - 5 /HPF    Comment: Performed at [facility], [address], [place], [place] [address]  CBG monitoring, ED     Status: Abnormal   Collection Time: 03/25/23 10:04 PM  Result Value Ref Range   Glucose-Capillary 187 (H) 70 - 99 mg/dL    Comment: Glucose reference range applies only to samples taken after fasting for at least 8 hours.  CBC     Status: Abnormal   Collection Time: 03/26/23  5:00 AM  Result Value Ref Range   WBC 8.4 4.0 - 10.5 K/uL   RBC 5.66 4.22 - 5.81 MIL/uL   Hemoglobin 16.6 13.0 - 17.0 g/dL   HCT 11.9 14.7 - 82.9 %   MCV 91.7 80.0 - 100.0 fL   MCH 29.3 26.0 - 34.0 pg   MCHC 32.0 30.0 - 36.0 g/dL   RDW 56.2 13.0 - 86.5 %   Platelets 142 (L) 150 - 400 K/uL   nRBC 0.0 0.0 - 0.2 %    Comment: Performed at [facility], [address] [place], [place] [address]  Basic metabolic panel     Status: Abnormal   Collection Time: 03/26/23  5:00 AM  Result Value Ref Range   Sodium 138 135 - 145 mmol/L   Potassium 3.6 3.5 - 5.1 mmol/L   Chloride 103 98 - 111 mmol/L   CO2 26 22 - 32 mmol/L   Glucose, Bld 135 (H) 70 - 99 mg/dL    Comment: Glucose reference range applies only to samples taken after fasting for at least 8 hours.   BUN 20 8 - 23 mg/dL   Creatinine, Ser  1.37 (H) 0.61 - 1.24 mg/dL   Calcium 8.9 8.9 - 16.1 mg/dL   GFR, Estimated 51 (L) >60 mL/min    Comment: (NOTE) Calculated using the CKD-EPI Creatinine Equation (2021)    Anion gap 9 5 - 15    Comment: Performed at [facility], [address], [place], [place] [address]  Vitamin B12     Status: None   Collection Time: 03/26/23  6:06 AM  Result Value Ref Range   Vitamin B-12 213 180 - 914 pg/mL    Comment: (NOTE) This assay is not  validated for testing neonatal or myeloproliferative syndrome specimens for Vitamin B12 levels. Performed at [facility], [address] [place], [place] [address]   CBG monitoring, ED     Status: None   Collection Time: 03/26/23  7:24 AM  Result Value Ref Range   Glucose-Capillary 84 70 - 99 mg/dL    Comment: Glucose reference range applies only to samples taken after fasting for at least 8 hours.   CT Angio Head W or Wo Contrast  Result Date: 03/25/2023 CLINICAL DATA:  Initial evaluation for stroke. EXAM: CT ANGIOGRAPHY HEAD AND NECK TECHNIQUE: Multidetector CT imaging of the head and neck was performed using the standard protocol during bolus administration of intravenous contrast. Multiplanar CT image reconstructions and MIPs were obtained to evaluate the vascular anatomy. Carotid stenosis measurements (when applicable) are obtained utilizing NASCET criteria, using the distal internal carotid diameter as the denominator. RADIATION DOSE REDUCTION: This exam was performed according to the departmental dose-optimization program which includes automated exposure control, adjustment of the mA and/or kV according to patient size and/or use of iterative reconstruction technique. CONTRAST:  OMNIPAQUE IOHEXOL 350 MG/ML SOLN COMPARISON:  MRI and CT from earlier the same day. FINDINGS: CTA NECK FINDINGS Aortic arch: Visualized aortic arch normal caliber with standard branch pattern. Moderate atheromatous change about the arch itself, with irregular soft plaque/thrombus protruding into the aortic lumen (series 504, image 23). Right carotid system: Right common and internal carotid arteries are patent without stenosis or dissection. Left carotid system: Left common and internal carotid arteries are patent without stenosis or dissection. Vertebral arteries: Both vertebral arteries arise from the subclavian arteries. No proximal subclavian artery stenosis. Mild atheromatous irregularity  about the vertebral arteries without high-grade stenosis or dissection. Skeleton: no discrete or worrisome osseous lesions. Moderate degenerative spondylosis at C4-5 through C6-7. Other neck: No other acute soft tissue abnormality within the neck. Upper chest: Visualized upper chest demonstrates no acute finding. Review of the MIP images confirms the above findings CTA HEAD FINDINGS Anterior circulation: Mild atheromatous change within the carotid siphons without stenosis. A1 segments, anterior communicating complex common anterior cerebral arteries widely patent. No M1 stenosis or occlusion. No proximal MCA branch occlusion or stenosis. Distal MCA branches perfused and symmetric. Posterior circulation: Both V4 segments patent without stenosis. Both PICA patent. 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 Basilar diminutive but widely patent. Superior cerebral arteries patent bilaterally. Predominant fetal type origin of the PCAs. Both PCAs widely patent to their distal aspects without stenosis. Venous sinuses: Grossly patent allowing for timing the contrast bolus. Anatomic variants: As above.  No aneurysm. Review of the MIP images confirms the above findings IMPRESSION: 1. Negative CTA for large vessel occlusion or other emergent finding. 2. Mild atheromatous disease for age without hemodynamically significant or correctable stenosis. Aortic Atherosclerosis (ICD10-I70.0). Electronically Signed   By: Rise Mu M.D.   On: 03/25/2023 22:09  CT Angio Neck W and/or Wo Contrast  Result Date: 03/25/2023 CLINICAL DATA:  Initial evaluation for stroke. EXAM: CT ANGIOGRAPHY HEAD AND NECK TECHNIQUE: Multidetector CT imaging of the head and neck was performed using the standard protocol during bolus administration of intravenous contrast. Multiplanar CT image reconstructions and MIPs were obtained to evaluate the vascular anatomy. Carotid stenosis measurements (when applicable) are obtained  utilizing NASCET criteria, using the distal internal carotid diameter as the denominator. RADIATION DOSE REDUCTION: This exam was performed according to the departmental dose-optimization program which includes automated exposure control, adjustment of the mA and/or kV according to patient size and/or use of iterative reconstruction technique. CONTRAST:  OMNIPAQUE IOHEXOL 350 MG/ML SOLN COMPARISON:  MRI and CT from earlier the same day. FINDINGS: CTA NECK FINDINGS Aortic arch: Visualized aortic arch normal caliber with standard branch pattern. Moderate atheromatous change about the arch itself, with irregular soft plaque/thrombus protruding into the aortic lumen (series 504, image 23). Right carotid system: Right common and internal carotid arteries are patent without stenosis or dissection. Left carotid system: Left common and internal carotid arteries are patent without stenosis or dissection. Vertebral arteries: Both vertebral arteries arise from the subclavian arteries. No proximal subclavian artery stenosis. Mild atheromatous irregularity about the vertebral arteries without high-grade stenosis or dissection. Skeleton: no discrete or worrisome osseous lesions. Moderate degenerative spondylosis at C4-5 through C6-7. Other neck: No other acute soft tissue abnormality within the neck. Upper chest: Visualized upper chest demonstrates no acute finding. Review of the MIP images confirms the above findings CTA HEAD FINDINGS Anterior circulation: Mild atheromatous change within the carotid siphons without stenosis. A1 segments, anterior communicating complex common anterior cerebral arteries widely patent. No M1 stenosis or occlusion. No proximal MCA branch occlusion or stenosis. Distal MCA branches perfused and symmetric. Posterior circulation: Both V4 segments patent without stenosis. Both PICA patent. 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 Basilar diminutive but widely patent.  Superior cerebral arteries patent bilaterally. Predominant fetal type origin of the PCAs. Both PCAs widely patent to their distal aspects without stenosis. Venous sinuses: Grossly patent allowing for timing the contrast bolus. Anatomic variants: As above.  No aneurysm. Review of the MIP images confirms the above findings IMPRESSION: 1. Negative CTA for large vessel occlusion or other emergent finding. 2. Mild atheromatous disease for age without hemodynamically significant or correctable stenosis. Aortic Atherosclerosis (ICD10-I70.0). Electronically Signed   By: Rise Mu M.D.   On: 03/25/2023 22:09   MR BRAIN WO CONTRAST  Result Date: 03/25/2023 CLINICAL DATA:  Initial evaluation for word-finding difficulty, recent stroke. EXAM: MRI HEAD WITHOUT CONTRAST TECHNIQUE: Multiplanar, multiecho pulse sequences of the brain and surrounding structures were obtained without intravenous contrast. COMPARISON:  CT from earlier same day as well as prior MRI from 03/05/2023. FINDINGS: Brain: Age-appropriate cerebral volume loss. Patchy T2/FLAIR hyperintensity involving the periventricular deep white matter both cerebral hemispheres, consistent with moderate chronic microvascular ischemic disease. Few scatter remote lacunar infarcts present about the hemispheric cerebral white matter and deep gray nuclei. Few scattered small remote bilateral cerebellar infarcts noted. Patchy subcentimeter acute ischemic nonhemorrhagic infarct present at the anterior right frontal corona radiata/basal ganglia (series 9, image 32). Additional subcentimeter acute ischemic nonhemorrhagic subcortical infarct noted at the contralateral left frontal lobe (series 9, image 39). Normal expected interval evolution of previously identified subcentimeter infarct at the right temporal occipital junction (series 9, image 23). No associated hemorrhage or mass effect. No other evidence for acute or subacute ischemia. No acute or chronic intracranial  blood products. No mass lesion, mass effect or midline shift. No hydrocephalus or extra-axial fluid collection. Pituitary gland and suprasellar region within normal limits. Vascular: Major intracranial vascular flow voids are maintained. Skull and upper cervical spine: Craniocervical junction within normal limits. Bone marrow signal intensity normal. No scalp soft tissue abnormality. Sinuses/Orbits: Prior bilateral ocular lens replacement. Paranasal sinuses are largely clear. No significant mastoid effusion. Other: None. IMPRESSION: 1. 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 2. Normal expected interval evolution of previously identified subcentimeter infarct at the right temporoccipital junction. No associated hemorrhage or mass effect. 3. Underlying age-appropriate cerebral volume loss with moderate chronic microvascular ischemic disease, with a few scattered remote lacunar infarcts involving the hemispheric cerebral white matter, deep gray nuclei, and cerebellum. Electronically Signed   By: Rise Mu M.D.   On: 03/25/2023 18:19   CT HEAD WO CONTRAST  Result Date: 03/25/2023 CLINICAL DATA:  Fall EXAM: CT HEAD WITHOUT CONTRAST CT CERVICAL SPINE WITHOUT CONTRAST TECHNIQUE: Multidetector CT imaging of the head and cervical spine was performed following the standard protocol without intravenous contrast. Multiplanar CT image reconstructions of the cervical spine were also generated. RADIATION DOSE REDUCTION: This exam was performed according to the departmental dose-optimization program which includes automated exposure control, adjustment of the mA and/or kV according to patient size and/or use of iterative reconstruction technique. COMPARISON:  None Available. FINDINGS: CT HEAD FINDINGS Brain: No evidence of acute infarction, hemorrhage, hydrocephalus, extra-axial collection or  mass lesion/mass effect. There is sequela of moderate severe chronic microvascular ischemic change. Vascular: No hyperdense vessel or unexpected calcification. Skull: Normal. Negative for fracture or focal lesion. Sinuses/Orbits: No middle ear or mastoid effusion. Paranasal sinuses clear. Bilateral lens replacement. Orbits are otherwise unremarkable Other: None. CT CERVICAL SPINE FINDINGS Alignment: There is straightening of the normal cervical lordosis. Skull base and vertebrae: No acute fracture. No primary bone lesion or focal pathologic process. Soft tissues and spinal canal: No prevertebral fluid or swelling. No visible canal hematoma. 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 Other: None IMPRESSION: 1. No CT evidence of intracranial injury. Sequela of moderate to severe chronic microvascular ischemic change. 2. No acute fracture or traumatic malalignment of the cervical spine. 3. 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 Electronically Signed   By: Lorenza Cambridge M.D.   On: 03/25/2023 13:04   CT Cervical Spine Wo Contrast  Result Date: 03/25/2023 CLINICAL DATA:  Fall EXAM: CT HEAD WITHOUT CONTRAST CT CERVICAL SPINE WITHOUT CONTRAST TECHNIQUE: Multidetector CT imaging of the head and cervical spine was performed following the standard protocol without intravenous contrast. Multiplanar CT image reconstructions of the cervical spine were also generated. RADIATION DOSE REDUCTION: This exam was performed according to the departmental dose-optimization program which includes automated exposure control, adjustment of the mA and/or kV according to patient size and/or use of iterative reconstruction technique. COMPARISON:  None Available. FINDINGS: CT HEAD FINDINGS Brain: No evidence of acute infarction, hemorrhage, hydrocephalus, extra-axial collection or mass lesion/mass effect. There is sequela of moderate severe chronic microvascular ischemic change. Vascular: No  hyperdense vessel or unexpected calcification. Skull: Normal. Negative for fracture or focal lesion. Sinuses/Orbits: No middle ear or mastoid effusion. Paranasal sinuses clear. Bilateral lens replacement. Orbits are otherwise unremarkable Other: None. CT CERVICAL SPINE FINDINGS Alignment: There is straightening of the normal cervical lordosis. Skull base and vertebrae: No acute fracture. No primary bone lesion or focal pathologic  process. Soft tissues and spinal canal: No prevertebral fluid or swelling. No visible canal hematoma. 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 Other: None IMPRESSION: 1. No CT evidence of intracranial injury. Sequela of moderate to severe chronic microvascular ischemic change. 2. No acute fracture or traumatic malalignment of the cervical spine. 3. 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 Electronically Signed   By: Lorenza Cambridge M.D.   On: 03/25/2023 13:04    Pending Labs Unresulted Labs (From admission, onward)     Start     Ordered   03/26/23 0559  RPR  Once,   R        03/26/23 0558   03/26/23 0559  HIV Antibody (routine testing w rflx)  (HIV Antibody (Routine testing w reflex) panel)  Add-on,   AD        03/26/23 0558   03/26/23 0558  Vitamin B1  Once,   R        03/26/23 0558            Vitals/Pain Today's Vitals   03/26/23 0638 03/26/23 0719 03/26/23 0731 03/26/23 0823  BP:  (!) 178/71 (!) 169/93 (!) 168/92  Pulse:  69 69 69  Resp:  17 16 17   Temp: (!) 97.5 F (36.4 C) 97.6 F (36.4 C) 97.6 F (36.4 C)   TempSrc: Oral Oral Oral   SpO2:  97% 91% 100%  Weight:      Height:      PainSc:        Isolation Precautions No active isolations  Medications Medications   stroke: early stages of recovery book (has no administration in time range)  acetaminophen (TYLENOL) tablet 650 mg (has no administration in time range)    Or  acetaminophen (TYLENOL) 160 MG/5ML solution 650 mg (has no administration in  time range)    Or  acetaminophen (TYLENOL) suppository 650 mg (has no administration in time range)  senna-docusate (Senokot-S) tablet 1 tablet (has no administration in time range)  enoxaparin (LOVENOX) injection 40 mg (40 mg Subcutaneous Given 03/25/23 2206)  hydrALAZINE (APRESOLINE) injection 10 mg (10 mg Intravenous Given 03/26/23 0529)  aspirin chewable tablet 81 mg (81 mg Oral Given 03/25/23 2206)  pravastatin (PRAVACHOL) tablet 40 mg (has no administration in time range)  tamsulosin (FLOMAX) capsule 0.4 mg (has no administration in time range)  clopidogrel (PLAVIX) tablet 75 mg (has no administration in time range)  insulin aspart (novoLOG) injection 0-9 Units ( Subcutaneous Not Given 03/26/23 0732)  insulin aspart (novoLOG) injection 0-5 Units ( Subcutaneous Not Given 03/25/23 2207)  iohexol (OMNIPAQUE) 350 MG/ML injection 100 mL (100 mLs Intravenous Contrast Given 03/25/23 2120)    Mobility walks     Focused Assessments    R Recommendations: See Admitting Provider Note  Report given to:   Additional Notes:

## 2023-03-26 NOTE — ED Notes (Signed)
Spoke with pts wife Tree surgeon) to inform her of pts transfer.

## 2023-03-27 ENCOUNTER — Inpatient Hospital Stay (HOSPITAL_COMMUNITY): Payer: Medicare Other

## 2023-03-27 DIAGNOSIS — R410 Disorientation, unspecified: Secondary | ICD-10-CM | POA: Diagnosis present

## 2023-03-27 DIAGNOSIS — E785 Hyperlipidemia, unspecified: Secondary | ICD-10-CM | POA: Diagnosis present

## 2023-03-27 DIAGNOSIS — I700 Atherosclerosis of aorta: Secondary | ICD-10-CM

## 2023-03-27 DIAGNOSIS — Z79899 Other long term (current) drug therapy: Secondary | ICD-10-CM | POA: Diagnosis not present

## 2023-03-27 DIAGNOSIS — I639 Cerebral infarction, unspecified: Secondary | ICD-10-CM | POA: Diagnosis not present

## 2023-03-27 DIAGNOSIS — I63133 Cerebral infarction due to embolism of bilateral carotid arteries: Secondary | ICD-10-CM | POA: Diagnosis not present

## 2023-03-27 DIAGNOSIS — Z66 Do not resuscitate: Secondary | ICD-10-CM | POA: Diagnosis present

## 2023-03-27 DIAGNOSIS — N400 Enlarged prostate without lower urinary tract symptoms: Secondary | ICD-10-CM | POA: Diagnosis present

## 2023-03-27 DIAGNOSIS — I350 Nonrheumatic aortic (valve) stenosis: Secondary | ICD-10-CM | POA: Diagnosis not present

## 2023-03-27 DIAGNOSIS — I10 Essential (primary) hypertension: Secondary | ICD-10-CM | POA: Diagnosis not present

## 2023-03-27 DIAGNOSIS — Z794 Long term (current) use of insulin: Secondary | ICD-10-CM | POA: Diagnosis not present

## 2023-03-27 DIAGNOSIS — E1122 Type 2 diabetes mellitus with diabetic chronic kidney disease: Secondary | ICD-10-CM | POA: Diagnosis present

## 2023-03-27 DIAGNOSIS — Z7982 Long term (current) use of aspirin: Secondary | ICD-10-CM | POA: Diagnosis not present

## 2023-03-27 DIAGNOSIS — Z87891 Personal history of nicotine dependence: Secondary | ICD-10-CM | POA: Diagnosis not present

## 2023-03-27 DIAGNOSIS — I358 Other nonrheumatic aortic valve disorders: Secondary | ICD-10-CM | POA: Diagnosis not present

## 2023-03-27 DIAGNOSIS — L89312 Pressure ulcer of right buttock, stage 2: Secondary | ICD-10-CM | POA: Diagnosis present

## 2023-03-27 DIAGNOSIS — Z885 Allergy status to narcotic agent status: Secondary | ICD-10-CM | POA: Diagnosis not present

## 2023-03-27 DIAGNOSIS — I129 Hypertensive chronic kidney disease with stage 1 through stage 4 chronic kidney disease, or unspecified chronic kidney disease: Secondary | ICD-10-CM | POA: Diagnosis present

## 2023-03-27 DIAGNOSIS — D696 Thrombocytopenia, unspecified: Secondary | ICD-10-CM | POA: Diagnosis present

## 2023-03-27 DIAGNOSIS — I6319 Cerebral infarction due to embolism of other precerebral artery: Secondary | ICD-10-CM | POA: Diagnosis present

## 2023-03-27 DIAGNOSIS — W19XXXA Unspecified fall, initial encounter: Secondary | ICD-10-CM | POA: Diagnosis not present

## 2023-03-27 DIAGNOSIS — F0150 Vascular dementia without behavioral disturbance: Secondary | ICD-10-CM | POA: Diagnosis present

## 2023-03-27 DIAGNOSIS — E669 Obesity, unspecified: Secondary | ICD-10-CM | POA: Diagnosis present

## 2023-03-27 DIAGNOSIS — I6389 Other cerebral infarction: Secondary | ICD-10-CM | POA: Diagnosis not present

## 2023-03-27 DIAGNOSIS — Z7984 Long term (current) use of oral hypoglycemic drugs: Secondary | ICD-10-CM | POA: Diagnosis not present

## 2023-03-27 DIAGNOSIS — G9341 Metabolic encephalopathy: Secondary | ICD-10-CM | POA: Diagnosis not present

## 2023-03-27 DIAGNOSIS — N1831 Chronic kidney disease, stage 3a: Secondary | ICD-10-CM | POA: Diagnosis present

## 2023-03-27 DIAGNOSIS — Z8249 Family history of ischemic heart disease and other diseases of the circulatory system: Secondary | ICD-10-CM | POA: Diagnosis not present

## 2023-03-27 DIAGNOSIS — Z888 Allergy status to other drugs, medicaments and biological substances status: Secondary | ICD-10-CM | POA: Diagnosis not present

## 2023-03-27 DIAGNOSIS — L89322 Pressure ulcer of left buttock, stage 2: Secondary | ICD-10-CM | POA: Diagnosis present

## 2023-03-27 DIAGNOSIS — R296 Repeated falls: Secondary | ICD-10-CM | POA: Diagnosis present

## 2023-03-27 DIAGNOSIS — Z952 Presence of prosthetic heart valve: Secondary | ICD-10-CM | POA: Diagnosis not present

## 2023-03-27 LAB — GLUCOSE, CAPILLARY
Glucose-Capillary: 126 mg/dL — ABNORMAL HIGH (ref 70–99)
Glucose-Capillary: 122 mg/dL — ABNORMAL HIGH (ref 70–99)
Glucose-Capillary: 180 mg/dL — ABNORMAL HIGH (ref 70–99)
Glucose-Capillary: 108 mg/dL — ABNORMAL HIGH (ref 70–99)

## 2023-03-27 LAB — ECHOCARDIOGRAM COMPLETE
AR max vel: 1.19 cm2
AV Area VTI: 1.55 cm2
AV Area mean vel: 1.32 cm2
AV Mean grad: 30 mmHg
AV Peak grad: 55.1 mmHg
Ao pk vel: 3.71 m/s
Area-P 1/2: 2.33 cm2
Calc EF: 71.1 %
Height: 71 in
S' Lateral: 2.2 cm
Single Plane A2C EF: 69.3 %
Single Plane A4C EF: 74.7 %
Weight: 3600 oz

## 2023-03-27 LAB — HIV ANTIBODY (ROUTINE TESTING W REFLEX): HIV Screen 4th Generation wRfx: NONREACTIVE

## 2023-03-27 LAB — TSH: TSH: 4.606 u[IU]/mL — ABNORMAL HIGH (ref 0.350–4.500)

## 2023-03-27 LAB — VITAMIN B12: Vitamin B-12: 252 pg/mL (ref 180–914)

## 2023-03-27 LAB — RPR: RPR Ser Ql: NONREACTIVE

## 2023-03-27 MED ORDER — INSULIN GLARGINE-YFGN 100 UNIT/ML ~~LOC~~ SOLN
15.0000 [IU] | Freq: Two times a day (BID) | SUBCUTANEOUS | Status: DC
  Administered 2023-03-27 – 2023-03-31 (×8): 15 [IU] via SUBCUTANEOUS
  Filled 2023-03-27 (×10): qty 0.15

## 2023-03-27 MED ORDER — VITAMIN B-12 1000 MCG PO TABS
1000.0000 ug | ORAL_TABLET | Freq: Every day | ORAL | Status: DC
  Administered 2023-03-27: 1000 ug via ORAL
  Filled 2023-03-27: qty 1

## 2023-03-27 MED ORDER — LOSARTAN POTASSIUM 50 MG PO TABS
100.0000 mg | ORAL_TABLET | Freq: Every day | ORAL | Status: DC
  Administered 2023-03-27 – 2023-03-31 (×5): 100 mg via ORAL
  Filled 2023-03-27 (×5): qty 2

## 2023-03-27 MED ORDER — ATENOLOL 50 MG PO TABS
50.0000 mg | ORAL_TABLET | Freq: Every day | ORAL | Status: DC
  Administered 2023-03-27 – 2023-03-31 (×5): 50 mg via ORAL
  Filled 2023-03-27 (×5): qty 1

## 2023-03-27 NOTE — Evaluation (Signed)
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   Severe aortic stenosis    Onychomycosis of toenail 12/04/2019   OAB (overactive bladder) 06/08/2019   Vitamin D deficiency 02/15/2018   Bilateral low back pain with sciatica 11/01/2017   Mild intermittent asthma without complication 12/28/2016   Primary gout 07/07/2016   Routine general medical examination at a health care facility 09/18/2011   GERD 07/22/2010   BPH (benign prostatic hyperplasia) 09/02/2009   Chronic kidney disease, stage 3a (HCC) 02/26/2009   Aortic valve disorder 02/25/2009   PERIPHERAL VASCULAR DISEASE 02/25/2009   Hyperlipidemia with target LDL less than 100 07/01/2007   Morbid obesity (HCC) 07/01/2007   Essential hypertension 07/01/2007   Past Medical History:  Past Medical History:  Diagnosis Date   Diabetes mellitus    type 2   Heart murmur    Hyperlipidemia    Hypertension    Hypogonadism, male    Pancreatitis 11/30/2006   Renal insufficiency    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 Excell Seltzer and Dr. Laneta Simmers   Severe aortic stenosis    Past Surgical History:  Past Surgical History:  Procedure Laterality Date   ALVEOLOPLASTY N/A 05/14/2022   Procedure: ALVEOLOPLASTY;  Surgeon: Sharman Cheek, DMD;  Location: [facility] OR;  Service: Dentistry;  Laterality:  N/A;   CARDIAC CATHETERIZATION     EYE SURGERY  2021   cataracts   FLEXIBLE SIGMOIDOSCOPY N/A 09/10/2017   Procedure: FLEXIBLE SIGMOIDOSCOPY;  Surgeon: Napoleon Form, MD;  Location: [facility] ENDOSCOPY;  Service: Endoscopy;  Laterality: N/A;   IR KYPHO LUMBAR INC FX REDUCE BONE BX UNI/BIL CANNULATION INC/IMAGING  09/14/2017   IR RADIOLOGIST EVAL & MGMT  10/07/2017   RIGHT HEART CATH AND CORONARY ANGIOGRAPHY N/A 05/07/2022   Procedure: RIGHT HEART CATH AND CORONARY ANGIOGRAPHY;  Surgeon: Tonny Bollman, MD;  Location[facility] INVASIVE CV LAB;  Service: Cardiovascular;  Laterality: N/A;   TEE WITHOUT CARDIOVERSION N/A 07/14/2022   Procedure: TRANSESOPHAGEAL ECHOCARDIOGRAM (TEE);  Surgeon: Tonny Bollman, MD;  Location: [facility] INVASIVE CV LAB;  Service: Open Heart Surgery;  Laterality: N/A;   TONSILLECTOMY     TOOTH EXTRACTION N/A 05/14/2022   Procedure: DENTAL EXTRACTIONS TEETH NUMBER TWO, THREE, FOUR, FIVE, FOURTEEN, FIFTEEN, TWENTY;  Surgeon: Sharman Cheek, DMD;  Location: [facility] OR;  Service: Dentistry;  Laterality: N/A;   HPI:  Pt is 82 year old male who presented to [facility] on  03/25/23 for AMS and frequent falls. Pt with recent rt temporal lobe infarct (outpt MRI 4/5). MRI on 4/25 showed patchy subcentimeter acute ischemic nonhemorrhagic infarcts involving the anterior right corona radiata/basal ganglia and contralateral subcortical left frontal lobe. Pt transferred to [facility] on 03/26/23.   PMH - HTN, DM, ckd, obesity, TAVR   Assessment / Plan / Recommendation Clinical Impression  Mr. Ham presents with cognitive deficits related specifically to attention and working memory.  He had difficulty sustaining attention to verbal  tasks and to instructions, often stopping mid response without awareness.  Attentional deficits significantly impacted recall.  He was oriented to year/month, place, and reason for hospitalization ("they say I had a stroke").  Speech was clear and fluent; language comprehension and  expression was [facility]. Pt will benefit from SLP tx here and at next level of care.  He would benefit from errorless learning techniques (e.g, spaced retrieval) during inteventions.    SLP Assessment  SLP Recommendation/Assessment: Patient needs continued Speech Lanaguage Pathology Services SLP Visit Diagnosis: Cognitive communication deficit (R41.841)    Recommendations for follow up therapy are one component of a multi-disciplinary discharge planning process, led by the attending physician.  Recommendations may be updated based on patient status, additional functional criteria and insurance authorization.    Follow Up Recommendations    Extended rehab < three hours per day   Assistance Recommended at Discharge  Frequent or constant Supervision/Assistance  Functional Status Assessment Patient has had a recent decline in their functional status and demonstrates the ability to make significant improvements in function in a reasonable and predictable amount of time.  Frequency and Duration min 2x/week  2 weeks      SLP Evaluation Cognition  Overall Cognitive Status: Impaired/Different from baseline Arousal/Alertness: Awake/alert Orientation Level: Oriented to place;Oriented to person Attention: Sustained Sustained Attention: Impaired Sustained Attention Impairment: Verbal basic Memory: Impaired Memory Impairment: Storage deficit;Decreased short term memory Decreased Short Term Memory: Verbal basic Awareness: Impaired Awareness Impairment: Intellectual impairment Safety/Judgment: Impaired       Comprehension  Auditory Comprehension Overall Auditory Comprehension: Appears within functional limits for tasks assessed Reading Comprehension Reading Status: Not tested    Expression Expression Primary Mode of Expression: Verbal Verbal Expression Overall Verbal Expression: Appears within functional limits for tasks assessed Written Expression Dominant Hand: Right   Oral / Motor  Oral  Motor/Sensory Function Overall Oral Motor/Sensory Function: Within functional limits Motor Speech Overall Motor Speech: Appears within functional limits for tasks assessed            Blenda Mounts Laurice 03/27/2023, 10:24 AM Marchelle Folks L. Samson Frederic, MA CCC/SLP Clinical Specialist - Acute Care SLP Acute Rehabilitation Services Office number [phone]

## 2023-03-27 NOTE — Evaluation (Signed)
Occupational Therapy Evaluation Patient Details Name: Ryan Duke MRN: [id number] DOB: 1940-12-25 Today's Date: 03/27/2023   History of Present Illness Pt is 82 year old presented to [facility] on  03/25/23 for AMS and frequent falls. Pt with recent rt temporal lobe infarct (outpt MRI 4/5). MRI on 4/25 showed patchy subcentimeter acute ischemic nonhemorrhagic infarcts involving the anterior right corona radiata/basal ganglia and contralateral subcortical left frontal lobe. Pt transferred to [facility] on 03/26/23.   PMH - HTN, DM, ckd, obesity, TAVR   Clinical Impression   Pt currently presents at min assist level for simulated toilet transfers and LB selfcare sit to stand with use of the RW for support.  Some decreased orientation and decreased problem solving noted with use of the RW during mobility as well.  Pt lives with his spouse who also uses a rollator and cannot provide physical assist.  Feel pt will benefit from acute care OT to progress ADL function back to his baseline level. Feel he will benefit from continued inpatient follow up post acute therapy, <3 hours/day.       Recommendations for follow up therapy are one component of a multi-disciplinary discharge planning process, led by the attending physician.  Recommendations may be updated based on patient status, additional functional criteria and insurance authorization.   Assistance Recommended at Discharge Frequent or constant Supervision/Assistance  Patient can return home with the following A little help with walking and/or transfers;Assist for transportation;Direct supervision/assist for financial management;Help with stairs or ramp for entrance;Direct supervision/assist for medications management    Functional Status Assessment  Patient has had a recent decline in their functional status and demonstrates the ability to make significant improvements in function in a reasonable and predictable amount of time.  Equipment Recommendations   None recommended by OT       Precautions / Restrictions Precautions Precautions: Fall Restrictions Weight Bearing Restrictions: No      Mobility Bed Mobility Overal bed mobility: Needs Assistance Bed Mobility: Supine to Sit     Supine to sit: Min guard, HOB elevated Sit to supine: Min guard   General bed mobility comments: Pt needed mod demonstrational cueing for sequencing scooting up in the bed once in supine.    Transfers                          Balance Overall balance assessment: Needs assistance Sitting-balance support: No upper extremity supported, Feet supported Sitting balance-Leahy Scale: Fair     Standing balance support: Bilateral upper extremity supported, During functional activity, Reliant on assistive device for balance Standing balance-Leahy Scale: Poor Standing balance comment: Pt needs RW for support in standing and with mobility.                           ADL either performed or assessed with clinical judgement   ADL Overall ADL's : Needs assistance/impaired Eating/Feeding: Independent;Sitting Eating/Feeding Details (indicate cue type and reason): simulated Grooming: Sitting;Set up Grooming Details (indicate cue type and reason): simulated Upper Body Bathing: Supervision/ safety;Sitting Upper Body Bathing Details (indicate cue type and reason): simulated Lower Body Bathing: Minimal assistance;Sit to/from stand Lower Body Bathing Details (indicate cue type and reason): simulated Upper Body Dressing : Supervision/safety;Sitting Upper Body Dressing Details (indicate cue type and reason): simulated Lower Body Dressing: Minimal assistance;Sit to/from stand   Toilet Transfer: Minimal assistance;Ambulation;Stand-pivot;Rolling walker (2 wheels) Toilet Transfer Details (indicate cue type and reason): simulated Toileting- Clothing  Manipulation and Hygiene: Minimal assistance;Sit to/from stand Toileting - Clothing Manipulation Details  (indicate cue type and reason): simulated     Functional mobility during ADLs: Minimal assistance;Rolling walker (2 wheels) General ADL Comments: Pt currently min assist level sit to stand from the EOB with use of the RW for support.  Able to ambulate over to the sink with min assist and mod demonstrational cueing for sequencing staying closer to the walker and to assist with direction of turn as he would try to turn the opposite of what therapist was wanting.     Vision Baseline Vision/History: 1 Wears glasses (reading only) Ability to See in Adequate Light: 0 Adequate Patient Visual Report: No change from baseline Additional Comments: Vision not formally assessed but pt reports no difficulty and could read the clock with any trouble when asked.  Will continue to assess in treatment.     Perception  WFL   Praxis  [facility]    Pertinent Vitals/Pain Pain Assessment Pain Assessment: Faces Pain Score: 0-No pain     Hand Dominance Right   Extremity/Trunk Assessment Upper Extremity Assessment Upper Extremity Assessment: RUE deficits/detail RUE Deficits / Details: right shoulder flexion 3+/5 all other areas WFLs bilaterally   Lower Extremity Assessment Lower Extremity Assessment: Defer to PT evaluation   Cervical / Trunk Assessment Cervical / Trunk Assessment: Normal   Communication Communication Communication: Expressive difficulties   Cognition Arousal/Alertness: Awake/alert Behavior During Therapy: WFL for tasks assessed/performed Overall Cognitive Status: Impaired/Different from baseline Area of Impairment: Attention, Memory, Following commands, Problem solving                 Orientation Level: Time, Disoriented to Current Attention Level: Sustained Memory: Decreased short-term memory Following Commands: Follows one step commands with increased time Safety/Judgement: Decreased awareness of safety, Decreased awareness of deficits Awareness: Intellectual Problem  Solving: Slow processing, Difficulty sequencing, Requires verbal cues, Requires tactile cues                  Home Living Family/patient expects to be discharged to:: Private residence Living Arrangements: Spouse/significant other Available Help at Discharge: Family;Available 24 hours/day (wife uses rollator. Can't physically assist) Type of Home: House Home Access: Stairs to enter [employer] of Steps: 3-4 Entrance Stairs-Rails: Right;Left Home Layout: Two level;Bed/bath upstairs;1/2 bath on main level Alternate Level Stairs-Number of Steps: flight Alternate Level Stairs-Rails: Right;Left Bathroom Shower/Tub: Chief Strategy Officer: Handicapped height     Home Equipment: Rollator (4 wheels);Rolling Walker (2 wheels);Shower seat   Additional Comments: difficulty describing home equipment      Prior Functioning/Environment Prior Level of Function : Independent/Modified Independent;Driving;History of Falls (last six months)             Mobility Comments: Uses rollator. Recent falls          OT Problem List: Decreased strength;Impaired balance (sitting and/or standing);Decreased knowledge of use of DME or AE;Decreased cognition;Decreased safety awareness      OT Treatment/Interventions: Self-care/ADL training;DME and/or AE instruction;Cognitive remediation/compensation;Therapeutic activities;Balance training;Patient/family education    OT Goals(Current goals can be found in the care plan section) Acute Rehab OT Goals Patient Stated Goal: Pt wants to "get out of here" OT Goal Formulation: With patient Time For Goal Achievement: 04/10/23 Potential to Achieve Goals: Good  OT Frequency: Min 2X/week       AM-PAC OT "6 Clicks" Daily Activity     Outcome Measure Help from another person eating meals?: None Help from another person taking care of personal  grooming?: A Little Help from another person toileting, which includes using toliet, bedpan,  or urinal?: A Little Help from another person bathing (including washing, rinsing, drying)?: A Little Help from another person to put on and taking off regular upper body clothing?: A Little Help from another person to put on and taking off regular lower body clothing?: A Little 6 Click Score: 19   End of Session Equipment Utilized During Treatment: Rolling walker (2 wheels);Gait belt Nurse Communication: Mobility status  Activity Tolerance: Patient tolerated treatment well Patient left: in bed;with call bell/phone within reach;with bed alarm set  OT Visit Diagnosis: Unsteadiness on feet (R26.81);Muscle weakness (generalized) (M62.81);Other abnormalities of gait and mobility (R26.89);Other symptoms and signs involving cognitive function                Time: 9562-1308 OT Time Calculation (min): 41 min Charges:  OT General Charges $OT Visit: 1 Visit OT Treatments $Self Care/Home Management : 23-37 mins  Perrin Maltese, OTR/L Acute Rehabilitation Services  Office [phone] 03/27/2023

## 2023-03-27 NOTE — Progress Notes (Signed)
STROKE TEAM PROGRESS NOTE   INTERVAL HISTORY No family is at the bedside.  Patient lying in bed, eyes open, awake alert, orientated to self, age and people but not to time.  Mild psychomotor slowing, but able to name and repeat.  Knows the hospital, however not able to tell me why he is in hospital.  Patient presented to the ED 4/25 status post fall with altered mental status.  Wife noted at admission that patient fell 3 weeks ago and has not been doing well since then, has been falling often since late March.  MRI shows patchy subacute ischemic nonhemorrhagic infarcts right corona radiata/basal ganglia and subcortical left frontal lobe CTA negative for LVO.  Outpatient MRI on 4/5 showed 4 mm acute infarct right temporal lobe, no follow-up appointments noted patient does take aspirin at home but is not on any blood other blood thinners   Vitals:   03/26/23 2000 03/27/23 0041 03/27/23 0400 03/27/23 0821  BP: (!) 167/80 (!) 155/77 (!) 178/84 (!) 180/78  Pulse: 70 60 84 74  Resp: (!) 21 20 19  (!) 25  Temp: 98.1 F (36.7 C) 97.8 F (36.6 C) 98 F (36.7 C) 98.3 F (36.8 C)  TempSrc: Oral Axillary Axillary Oral  SpO2: 99% 97% 97% 96%  Weight:      Height:       CBC:  Recent Labs  Lab 03/25/23 1145 03/26/23 0500  WBC 8.1 8.4  NEUTROABS 5.8  --   HGB 16.5 16.6  HCT 53.0* 51.9  MCV 91.5 91.7  PLT 130* 142*   Basic Metabolic Panel:  Recent Labs  Lab 03/25/23 1145 03/26/23 0500  NA 138 138  K 3.7 3.6  CL 103 103  CO2 25 26  GLUCOSE 119* 135*  BUN 22 20  CREATININE 1.34* 1.37*  CALCIUM 9.0 8.9   Lipid Panel:  Recent Labs  Lab 03/25/23 1145  CHOL 156  TRIG 75  HDL 45  CHOLHDL 3.5  VLDL 15  LDLCALC 96   HgbA1c:  Recent Labs  Lab 03/25/23 1145  HGBA1C 6.7*   Urine Drug Screen:  Recent Labs  Lab 03/25/23 1350  LABOPIA NONE DETECTED  COCAINSCRNUR NONE DETECTED  LABBENZ NONE DETECTED  AMPHETMU NONE DETECTED  THCU NONE DETECTED  LABBARB NONE DETECTED     Alcohol Level  Recent Labs  Lab 03/25/23 1145  ETH <10    IMAGING past 24 hours VAS [place] LOWER EXTREMITY VENOUS (DVT)  Result Date: 03/27/2023  Lower Venous DVT Study Patient Name:  SAIVON PROWSE Koogler  Date of Exam:   03/27/2023 Medical Rec #: [id number]        Accession #:    [id number] Date of Birth: [birth year]        Patient Gender: M Patient Age:   82 years Exam Location:  [facility] Procedure:      VAS [place] LOWER EXTREMITY VENOUS (DVT) Referring Phys: Scheryl Marten Kanton Kamel --------------------------------------------------------------------------------  Indications: Stroke.  Limitations: Poor ultrasound/tissue interface. Comparison Study: Previous exam on 09/06/2017 was negative for DVT Performing Technologist: Ernestene Mention RVT, RDMS  Examination Guidelines: A complete evaluation includes B-mode imaging, spectral Doppler, color Doppler, and power Doppler as needed of all accessible portions of each vessel. Bilateral testing is considered an integral part of a complete examination. Limited examinations for reoccurring indications may be performed as noted. The reflux portion of the exam is performed with the patient in reverse Trendelenburg.  +---------+---------------+---------+-----------+----------+--------------+ RIGHT    CompressibilityPhasicitySpontaneityPropertiesThrombus Aging +---------+---------------+---------+-----------+----------+--------------+ CFV  Full           Yes      Yes                                 +---------+---------------+---------+-----------+----------+--------------+ SFJ      Full                                                        +---------+---------------+---------+-----------+----------+--------------+ FV Prox  Full           Yes      Yes                                 +---------+---------------+---------+-----------+----------+--------------+ FV Mid   Full           No       Yes        pulsatile                 +---------+---------------+---------+-----------+----------+--------------+ FV DistalFull           No       Yes        pulsatile                +---------+---------------+---------+-----------+----------+--------------+ PFV      Full                                                        +---------+---------------+---------+-----------+----------+--------------+ POP      Full           No       Yes        pulsatile                +---------+---------------+---------+-----------+----------+--------------+ PTV      Full                                                        +---------+---------------+---------+-----------+----------+--------------+ PERO     Full                                                        +---------+---------------+---------+-----------+----------+--------------+   +---------+---------------+---------+-----------+----------+-------------------+ LEFT     CompressibilityPhasicitySpontaneityPropertiesThrombus Aging      +---------+---------------+---------+-----------+----------+-------------------+ CFV      Full           Yes      Yes                                      +---------+---------------+---------+-----------+----------+-------------------+ SFJ      Full                                                             +---------+---------------+---------+-----------+----------+-------------------+  FV Prox  Full           Yes      Yes                                      +---------+---------------+---------+-----------+----------+-------------------+ FV Mid   Full           Yes      Yes                                      +---------+---------------+---------+-----------+----------+-------------------+ FV DistalFull           Yes      Yes                                      +---------+---------------+---------+-----------+----------+-------------------+ PFV      Full                                                              +---------+---------------+---------+-----------+----------+-------------------+ POP      Full           Yes      Yes                                      +---------+---------------+---------+-----------+----------+-------------------+ PTV                                                   Not well visualized +---------+---------------+---------+-----------+----------+-------------------+ PERO                                                  Not well visualized +---------+---------------+---------+-----------+----------+-------------------+    Summary: BILATERAL: - No evidence of deep vein thrombosis seen in the lower extremities, bilaterally. -No evidence of popliteal cyst, bilaterally.   *See table(s) above for measurements and observations.    Preliminary     PHYSICAL EXAM  Temp:  [97.8 F (36.6 C)-98.9 F (37.2 C)] 98.9 F (37.2 C) (04/27 1540) Pulse Rate:  [60-84] 75 (04/27 1540) Resp:  [19-25] 20 (04/27 1540) BP: (155-180)/(77-90) 168/90 (04/27 1540) SpO2:  [93 %-99 %] 93 % (04/27 1540)  General - Well nourished, well developed, in no apparent distress.  Ophthalmologic - fundi not visualized due to noncooperation.  Cardiovascular - Regular rhythm and rate.  Neuro - awake, alert, eyes open, orientated to age, place and people, but not to time. No aphasia, positive speech, following all simple commands, however, mild psychomotor slowing. Able to name and repeat. No gaze palsy, tracking bilaterally, visual field full. No facial droop. Tongue midline. Bilateral UEs 5/5, no drift. Bilaterally LEs 4/5, no drift. Sensation symmetrical bilaterally subjectively, b/l FTN intact grossly, gait not tested.  ASSESSMENT/PLAN Mr. JAHMIR SALO is a 82 y.o. male with history of hypertension, hyperlipidemia, diabetes, CKD, status post TAVR, aortic atherosclerosis with mobile atheroma presenting with frequent falls at home memory difficulty, urinary  incontinence, fatigue and generalized weakness.  No TNK given outside window  Stroke: Multifocal small infarcts, embolic pattern, etiology unclear, could be due to aortic mobile atheroma versus cardioembolic source Code Stroke CT head No acute abnormality. CTA head & neck unremarkable but significant aortic atherosclerosis MRI 4/5 right MCA/PCA punctate infarcts MRI 4/25 small right frontal horn white matter and left MCA/ACA small infarcts in addition to right MCA/PCA punctate infarcts. 2D Echo EF 70 to 75%, The gradient across the prosthetic valves has  increased since 08/19/22 study(prior mean gradient , up to 30 mmHg on  this study). Consider TEE for further structural evaluation of prosthetic  valve.  LE venous Doppler negative for DVT Recommend TEE for further evaluation of TAVR and aortic atheroma If work up unrevealing, will recommend loop recorder prior to discharge. LDL 96 HgbA1c 6.7 UDS negative VTE prophylaxis -Lovenox aspirin 81 mg daily prior to admission, now on aspirin 81 mg daily and clopidogrel 75 mg daily DAPT for 3 weeks and then Plavix alone. Therapy recommendations: SNF Disposition: Pending  Aortic atheroma TEE in 06/2022 showed severe, mobile, grade 4 atheroma plaque involving the descending aorta are Consider repeat TEE On DAPT  Hypertension Home meds: Losartan 100 mg, atenolol 50 mg BP on the high end Resume home losartan and atenolol Long-term BP goal normotensive  Hyperlipidemia Home meds: Pravastatin 40 mg LDL 96, goal < 70 Changed to Crestor 20 Continue statin at discharge  Diabetes type II  Home meds: Jardiance 25 mg, insulin pen HgbA1c 6.7, goal < 7.0 CBGs SSI Close PCP follow-up as outpatient for better DM control  Other Stroke Risk Factors Advanced Age >/= 64  Obesity, Body mass index is 31.38 kg/m., BMI >/= 30 associated with increased stroke risk, recommend weight loss, diet and exercise as appropriate  Status post TAVR  Other  Active Problems CKD 2, creatinine 1.34-1.37 Mild thrombocytopenia, platelet 130-142 B12 = 213, B12 supplement  Hospital day # 0  I discussed with Dr. Thedore Mins. I spent extensive face-to-face time with the patient, more than 50% of which was spent in counseling and coordination of care, reviewing test results, images and medication, and discussing the diagnosis, treatment plan and potential prognosis. This patient's care requiresreview of multiple databases, neurological assessment, discussion with family, other specialists and medical decision making of high complexity.   Marvel Plan, MD PhD Stroke Neurology 03/27/2023 5:16 PM   To contact Stroke Continuity provider, please refer to [email or web address]. After hours, contact General Neurology

## 2023-03-27 NOTE — Progress Notes (Signed)
Echocardiogram 2D Echocardiogram has been performed.  Toni Amend 03/27/2023, 11:18 AM

## 2023-03-27 NOTE — Progress Notes (Signed)
PROGRESS NOTE                                                                                                                                                                                                             Patient Demographics:    Ryan Duke, is a 82 y.o. male, DOB - Feb 08, 1941, ZOX:[id number]  Outpatient Primary MD for the patient is [name], MD    LOS - 0  Admit date - 03/25/2023    Chief Complaint  Patient presents with   Altered Mental Status       Brief Narrative (HPI from H&P)   82 years old Male with PMH significant for insulin-dependent type 2 diabetes, CKD stage IIIa, severe aortic stenosis s/p TAVR, hypertension, hyperlipidemia, BPH presented to the ED for the evaluation of recurrent falls.  Patient reports recurrent fall for last 1 month,  states he lost his balance when walking resulting in falls.  Patient did have an outpatient MRI which showed 4 mm acute subcortical nonhemorrhagic white matter infarct in the posterior right temporal lobe.  In the ER workup again consistent with acute hemic stroke, he was seen by neurology team, stroke workup being done subsequently he has developed severe encephalopathy as well.  He now most likely will require SNF placement.   Subjective:    [employer] today has, No headache, No chest pain, No abdominal pain - No Nausea, No new weakness tingling or numbness, no shortness of breath but thoroughly confused and unreliable historian.   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 He has been seen by neurology team and has undergone full stroke workup, echo pending, currently on dual antiplatelet therapy, LDL above goal hence switched from Lipitor to Crestor, A1c satisfactory, severe encephalopathy but eventually will require SNF  placement.   Acute metabolic encephalopathy.  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  Full neurological workup including MRI brain and CTA head and neck, nonspecific EEG done.  Seen by neurology.  Will also check TSH, RPR, HIV and B12, continue supportive care, Haldol and restraints as needed, minimize narcotics and benzodiazepines.  Dyslipidemia.  Switch to Crestor  for better control.  Hypertension.  For now permissive hypertension due to #1.  Will start titrating with medications after about 4 to 5 days out of acute event.  CKD 3A.  Creatinine appears to be at baseline of 1.3.  Severe aortic stenosis s/p TAVR.  Supportive care for now.  Repeat echo pending.  Multiple falls at home.  Likely due to combination of #1 and 2 above, CT head, CTA head and neck no acute fractures or bleed.  ET, OT and SNF.  DM type II.  On high doses of Lantus and sliding scale, dose adjusted on 03/27/2023 for better control, poor oral intake hence will monitor cautiously.  Lab Results  Component Value Date   HGBA1C 6.7 (H) 03/25/2023   CBG (last 3)  Recent Labs    03/26/23 1139 03/26/23 1600 03/26/23 2044  GLUCAP 105* 164* 275*         Condition - Extremely Guarded  Family Communication  :  called wife [phone] 03/27/2023 at 7:44 AM and message left  Code Status :  DNR  Consults  :  Neuro  PUD Prophylaxis :    Procedures  :     MRI - 1. 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 2. Normal expected interval evolution of previously identified subcentimeter infarct at the right temporoccipital junction. No associated hemorrhage or mass effect. 3. Underlying age-appropriate cerebral volume loss with moderate chronic microvascular ischemic  disease, with a few scattered remote lacunar infarcts involving the hemispheric cerebral white matter, deep gray nuclei, and cerebellum.  TTE -   EEG - nonspecific encephalopathy.    CT Head and C Spine - 1. No CT evidence of intracranial injury. Sequela of moderate to severe chronic microvascular ischemic change. 2. No acute fracture or traumatic malalignment of the cervical spine. 3. 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  CTA - 1. Negative CTA for large vessel occlusion or other emergent finding. 2. Mild atheromatous disease for age without hemodynamically significant or correctable stenosis. Aortic Atherosclerosis       Disposition Plan  :    Status is: Observation  DVT Prophylaxis  :    enoxaparin (LOVENOX) injection 40 mg Start: 03/25/23 2200   Lab Results  Component Value Date   PLT 142 (L) 03/26/2023    Diet :  Diet Order             DIET SOFT Room service appropriate? No; Fluid consistency: Thin  Diet effective now                    Inpatient Medications  Scheduled Meds:  aspirin  81 mg Oral Daily   clopidogrel  75 mg Oral Daily   enoxaparin (LOVENOX) injection  40 mg Subcutaneous Q24H   insulin aspart  0-5 Units Subcutaneous QHS   insulin aspart  0-9 Units Subcutaneous TID WC   insulin glargine-yfgn  15 Units Subcutaneous BID   rosuvastatin  20 mg Oral Daily   tamsulosin  0.4 mg Oral Daily   Continuous Infusions: PRN Meds:.acetaminophen **OR** acetaminophen (TYLENOL) oral liquid 160 mg/5 mL **OR** acetaminophen, hydrALAZINE, senna-docusate  Antibiotics  :    Anti-infectives (From admission, onward)    None         Objective:   Vitals:   03/26/23 1600 03/26/23 2000 03/27/23 0041 03/27/23 0400  BP: (!) 165/89 (!) 167/80 (!) 155/77 (!) 178/84  Pulse:  70 60  84  Resp: 20 (!) 21 20 19   Temp: 98.2 F (36.8 C) 98.1 F (36.7 C) 97.8 F (36.6 C) 98 F (36.7 C)  TempSrc: Oral Oral Axillary Axillary  SpO2:  99% 97% 97%  Weight:       Height:        Wt Readings from Last 3 Encounters:  03/25/23 102.1 kg  03/04/23 102.5 kg  01/19/23 105.1 kg     Intake/Output Summary (Last 24 hours) at 03/27/2023 0743 Last data filed at 03/27/2023 0500 Gross per 24 hour  Intake 240 ml  Output 200 ml  Net 40 ml     Physical Exam  Awake Alert, No new F.N deficits, Normal affect [email or web address].AT,PERRAL Supple Neck, No JVD,   Symmetrical Chest wall movement, Good air movement bilaterally, CTAB RRR,No Gallops,Rubs or new Murmurs,  +ve B.Sounds, Abd Soft, No tenderness,   No Cyanosis, Clubbing or edema     RN pressure injury documentation: Pressure Injury 03/25/23 Buttocks Left;Right Stage 2 -  Partial thickness loss of dermis presenting as a shallow open injury with a red, pink wound bed without slough. (Active)  03/25/23 2300  Location: Buttocks  Location Orientation: Left;Right  Staging: Stage 2 -  Partial thickness loss of dermis presenting as a shallow open injury with a red, pink wound bed without slough.  Wound Description (Comments):   Present on Admission:   Dressing Type Foam - Lift dressing to assess site every shift 03/26/23 2000      Data Review:    Recent Labs  Lab 03/25/23 1145 03/26/23 0500  WBC 8.1 8.4  HGB 16.5 16.6  HCT 53.0* 51.9  PLT 130* 142*  MCV 91.5 91.7  MCH 28.5 29.3  MCHC 31.1 32.0  RDW 14.7 14.7  LYMPHSABS 1.4  --   MONOABS 0.6  --   EOSABS 0.2  --   BASOSABS 0.1  --     Recent Labs  Lab 03/25/23 1145 03/25/23 1146 03/26/23 0500  NA 138  --  138  K 3.7  --  3.6  CL 103  --  103  CO2 25  --  26  ANIONGAP 10  --  9  GLUCOSE 119*  --  135*  BUN 22  --  20  CREATININE 1.34*  --  1.37*  AST 20  --   --   ALT 13  --   --   ALKPHOS 78  --   --   BILITOT 1.0  --   --   ALBUMIN 3.9  --   --   HGBA1C 6.7*  --   --   AMMONIA  --  12  --   CALCIUM 9.0  --  8.9   Lab Results  Component Value Date   CHOL 156 03/25/2023   HDL 45 03/25/2023   LDLCALC 96 03/25/2023   LDLDIRECT  81.0 05/26/2018   TRIG 75 03/25/2023   CHOLHDL 3.5 03/25/2023    Lab Results  Component Value Date   HGBA1C 6.7 (H) 03/25/2023     Radiology Reports EEG adult  Result Date: 03/26/2023 Charlsie Quest, MD     03/26/2023 12:04 PM Patient Name: AUGUSTA HILBERT MRN: [id number] Epilepsy Attending: Charlsie Quest Referring Physician/Provider: Gordy Councilman, MD Date: 03/26/2023 Duration: 22.32 mins Patient history: 82yo M getting eeg to evaluate for seizure. Level of alertness: Awake AEDs during EEG study: None Technical aspects: This EEG study was done with scalp electrodes positioned according to the 10-20 International  system of electrode placement. Electrical activity was reviewed with band pass filter of 1-70Hz , sensitivity of 7 uV/mm, display speed of 7mm/sec with a 60Hz  notched filter applied as appropriate. EEG data were recorded continuously and digitally stored.  Video monitoring was available and reviewed as appropriate. Description: The posterior dominant rhythm consists of 7 Hz activity of moderate voltage (25-35 uV) seen predominantly in posterior head regions, symmetric and reactive to eye opening and eye closing. EEG showed continuous generalized 5 to 7 Hz theta slowing admixed with intermittent 2-3Hz  delta slowing. Hyperventilation and photic stimulation were not performed.   Of note, eeg was technically difficult due to significant electrical artifact. ABNORMALITY - Continuous slow, generalized - Background slow IMPRESSION: This technically difficult study is suggestive of moderate diffuse encephalopathy. No seizures or epileptiform discharges were seen throughout the recording. Charlsie Quest   CT Angio Head W or Wo Contrast  Result Date: 03/25/2023 CLINICAL DATA:  Initial evaluation for stroke. EXAM: CT ANGIOGRAPHY HEAD AND NECK TECHNIQUE: Multidetector CT imaging of the head and neck was performed using the standard protocol during bolus administration of intravenous contrast.  Multiplanar CT image reconstructions and MIPs were obtained to evaluate the vascular anatomy. Carotid stenosis measurements (when applicable) are obtained utilizing NASCET criteria, using the distal internal carotid diameter as the denominator. RADIATION DOSE REDUCTION: This exam was performed according to the departmental dose-optimization program which includes automated exposure control, adjustment of the mA and/or kV according to patient size and/or use of iterative reconstruction technique. CONTRAST:  OMNIPAQUE IOHEXOL 350 MG/ML SOLN COMPARISON:  MRI and CT from earlier the same day. FINDINGS: CTA NECK FINDINGS Aortic arch: Visualized aortic arch normal caliber with standard branch pattern. Moderate atheromatous change about the arch itself, with irregular soft plaque/thrombus protruding into the aortic lumen (series 504, image 23). Right carotid system: Right common and internal carotid arteries are patent without stenosis or dissection. Left carotid system: Left common and internal carotid arteries are patent without stenosis or dissection. Vertebral arteries: Both vertebral arteries arise from the subclavian arteries. No proximal subclavian artery stenosis. Mild atheromatous irregularity about the vertebral arteries without high-grade stenosis or dissection. Skeleton: no discrete or worrisome osseous lesions. Moderate degenerative spondylosis at C4-5 through C6-7. Other neck: No other acute soft tissue abnormality within the neck. Upper chest: Visualized upper chest demonstrates no acute finding. Review of the MIP images confirms the above findings CTA HEAD FINDINGS Anterior circulation: Mild atheromatous change within the carotid siphons without stenosis. A1 segments, anterior communicating complex common anterior cerebral arteries widely patent. No M1 stenosis or occlusion. No proximal MCA branch occlusion or stenosis. Distal MCA branches perfused and symmetric. Posterior circulation: Both V4  segments patent without stenosis. Both PICA patent. 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 Basilar diminutive but widely patent. Superior cerebral arteries patent bilaterally. Predominant fetal type origin of the PCAs. Both PCAs widely patent to their distal aspects without stenosis. Venous sinuses: Grossly patent allowing for timing the contrast bolus. Anatomic variants: As above.  No aneurysm. Review of the MIP images confirms the above findings IMPRESSION: 1. Negative CTA for large vessel occlusion or other emergent finding. 2. Mild atheromatous disease for age without hemodynamically significant or correctable stenosis. Aortic Atherosclerosis (ICD10-I70.0). Electronically Signed   By: Rise Mu M.D.   On: 03/25/2023 22:09   CT Angio Neck W and/or Wo Contrast  Result Date: 03/25/2023 CLINICAL DATA:  Initial evaluation for stroke. EXAM: CT ANGIOGRAPHY HEAD AND NECK TECHNIQUE: Multidetector CT imaging of the  head and neck was performed using the standard protocol during bolus administration of intravenous contrast. Multiplanar CT image reconstructions and MIPs were obtained to evaluate the vascular anatomy. Carotid stenosis measurements (when applicable) are obtained utilizing NASCET criteria, using the distal internal carotid diameter as the denominator. RADIATION DOSE REDUCTION: This exam was performed according to the departmental dose-optimization program which includes automated exposure control, adjustment of the mA and/or kV according to patient size and/or use of iterative reconstruction technique. CONTRAST:  OMNIPAQUE IOHEXOL 350 MG/ML SOLN COMPARISON:  MRI and CT from earlier the same day. FINDINGS: CTA NECK FINDINGS Aortic arch: Visualized aortic arch normal caliber with standard branch pattern. Moderate atheromatous change about the arch itself, with irregular soft plaque/thrombus protruding into the aortic lumen (series 504, image 23). Right carotid  system: Right common and internal carotid arteries are patent without stenosis or dissection. Left carotid system: Left common and internal carotid arteries are patent without stenosis or dissection. Vertebral arteries: Both vertebral arteries arise from the subclavian arteries. No proximal subclavian artery stenosis. Mild atheromatous irregularity about the vertebral arteries without high-grade stenosis or dissection. Skeleton: no discrete or worrisome osseous lesions. Moderate degenerative spondylosis at C4-5 through C6-7. Other neck: No other acute soft tissue abnormality within the neck. Upper chest: Visualized upper chest demonstrates no acute finding. Review of the MIP images confirms the above findings CTA HEAD FINDINGS Anterior circulation: Mild atheromatous change within the carotid siphons without stenosis. A1 segments, anterior communicating complex common anterior cerebral arteries widely patent. No M1 stenosis or occlusion. No proximal MCA branch occlusion or stenosis. Distal MCA branches perfused and symmetric. Posterior circulation: Both V4 segments patent without stenosis. Both PICA patent. 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 Basilar diminutive but widely patent. Superior cerebral arteries patent bilaterally. Predominant fetal type origin of the PCAs. Both PCAs widely patent to their distal aspects without stenosis. Venous sinuses: Grossly patent allowing for timing the contrast bolus. Anatomic variants: As above.  No aneurysm. Review of the MIP images confirms the above findings IMPRESSION: 1. Negative CTA for large vessel occlusion or other emergent finding. 2. Mild atheromatous disease for age without hemodynamically significant or correctable stenosis. Aortic Atherosclerosis (ICD10-I70.0). Electronically Signed   By: Rise Mu M.D.   On: 03/25/2023 22:09   MR BRAIN WO CONTRAST  Result Date: 03/25/2023 CLINICAL DATA:  Initial evaluation for word-finding  difficulty, recent stroke. EXAM: MRI HEAD WITHOUT CONTRAST TECHNIQUE: Multiplanar, multiecho pulse sequences of the brain and surrounding structures were obtained without intravenous contrast. COMPARISON:  CT from earlier same day as well as prior MRI from 03/05/2023. FINDINGS: Brain: Age-appropriate cerebral volume loss. Patchy T2/FLAIR hyperintensity involving the periventricular deep white matter both cerebral hemispheres, consistent with moderate chronic microvascular ischemic disease. Few scatter remote lacunar infarcts present about the hemispheric cerebral white matter and deep gray nuclei. Few scattered small remote bilateral cerebellar infarcts noted. Patchy subcentimeter acute ischemic nonhemorrhagic infarct present at the anterior right frontal corona radiata/basal ganglia (series 9, image 32). Additional subcentimeter acute ischemic nonhemorrhagic subcortical infarct noted at the contralateral left frontal lobe (series 9, image 39). Normal expected interval evolution of previously identified subcentimeter infarct at the right temporal occipital junction (series 9, image 23). No associated hemorrhage or mass effect. No other evidence for acute or subacute ischemia. No acute or chronic intracranial blood products. No mass lesion, mass effect or midline shift. No hydrocephalus or extra-axial fluid collection. Pituitary gland and suprasellar region within normal limits. Vascular: Major intracranial vascular flow voids  are maintained. Skull and upper cervical spine: Craniocervical junction within normal limits. Bone marrow signal intensity normal. No scalp soft tissue abnormality. Sinuses/Orbits: Prior bilateral ocular lens replacement. Paranasal sinuses are largely clear. No significant mastoid effusion. Other: None. IMPRESSION: 1. 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 2. Normal  expected interval evolution of previously identified subcentimeter infarct at the right temporoccipital junction. No associated hemorrhage or mass effect. 3. Underlying age-appropriate cerebral volume loss with moderate chronic microvascular ischemic disease, with a few scattered remote lacunar infarcts involving the hemispheric cerebral white matter, deep gray nuclei, and cerebellum. Electronically Signed   By: Rise Mu M.D.   On: 03/25/2023 18:19   CT HEAD WO CONTRAST  Result Date: 03/25/2023 CLINICAL DATA:  Fall EXAM: CT HEAD WITHOUT CONTRAST CT CERVICAL SPINE WITHOUT CONTRAST TECHNIQUE: Multidetector CT imaging of the head and cervical spine was performed following the standard protocol without intravenous contrast. Multiplanar CT image reconstructions of the cervical spine were also generated. RADIATION DOSE REDUCTION: This exam was performed according to the departmental dose-optimization program which includes automated exposure control, adjustment of the mA and/or kV according to patient size and/or use of iterative reconstruction technique. COMPARISON:  None Available. FINDINGS: CT HEAD FINDINGS Brain: No evidence of acute infarction, hemorrhage, hydrocephalus, extra-axial collection or mass lesion/mass effect. There is sequela of moderate severe chronic microvascular ischemic change. Vascular: No hyperdense vessel or unexpected calcification. Skull: Normal. Negative for fracture or focal lesion. Sinuses/Orbits: No middle ear or mastoid effusion. Paranasal sinuses clear. Bilateral lens replacement. Orbits are otherwise unremarkable Other: None. CT CERVICAL SPINE FINDINGS Alignment: There is straightening of the normal cervical lordosis. Skull base and vertebrae: No acute fracture. No primary bone lesion or focal pathologic process. Soft tissues and spinal canal: No prevertebral fluid or swelling. No visible canal hematoma. Disc levels:  Likely moderate spinal canal stenosis at C6-C7 Upper  chest: Negative. Other: None IMPRESSION: 1. No CT evidence of intracranial injury. Sequela of moderate to severe chronic microvascular ischemic change. 2. No acute fracture or traumatic malalignment of the cervical spine. 3. 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 Electronically Signed   By: Lorenza Cambridge M.D.   On: 03/25/2023 13:04   CT Cervical Spine Wo Contrast  Result Date: 03/25/2023 CLINICAL DATA:  Fall EXAM: CT HEAD WITHOUT CONTRAST CT CERVICAL SPINE WITHOUT CONTRAST TECHNIQUE: Multidetector CT imaging of the head and cervical spine was performed following the standard protocol without intravenous contrast. Multiplanar CT image reconstructions of the cervical spine were also generated. RADIATION DOSE REDUCTION: This exam was performed according to the departmental dose-optimization program which includes automated exposure control, adjustment of the mA and/or kV according to patient size and/or use of iterative reconstruction technique. COMPARISON:  None Available. FINDINGS: CT HEAD FINDINGS Brain: No evidence of acute infarction, hemorrhage, hydrocephalus, extra-axial collection or mass lesion/mass effect. There is sequela of moderate severe chronic microvascular ischemic change. Vascular: No hyperdense vessel or unexpected calcification. Skull: Normal. Negative for fracture or focal lesion. Sinuses/Orbits: No middle ear or mastoid effusion. Paranasal sinuses clear. Bilateral lens replacement. Orbits are otherwise unremarkable Other: None. CT CERVICAL SPINE FINDINGS Alignment: There is straightening of the normal cervical lordosis. Skull base and vertebrae: No acute fracture. No primary bone lesion or focal pathologic process. Soft tissues and spinal canal: No prevertebral fluid or swelling. No visible canal hematoma. 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 Other: None  IMPRESSION: 1. No CT evidence of intracranial injury. Sequela of moderate to severe  chronic microvascular ischemic change. 2. No acute fracture or traumatic malalignment of the cervical spine. 3. 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 Electronically Signed   By: Lorenza Cambridge M.D.   On: 03/25/2023 13:04      Signature  -   Susa Raring M.D on 03/27/2023 at 7:43 AM   -  To page go to [email or web address]

## 2023-03-28 ENCOUNTER — Inpatient Hospital Stay (HOSPITAL_COMMUNITY): Payer: Medicare Other

## 2023-03-28 DIAGNOSIS — W19XXXA Unspecified fall, initial encounter: Secondary | ICD-10-CM

## 2023-03-28 DIAGNOSIS — I63133 Cerebral infarction due to embolism of bilateral carotid arteries: Secondary | ICD-10-CM

## 2023-03-28 DIAGNOSIS — I639 Cerebral infarction, unspecified: Secondary | ICD-10-CM | POA: Diagnosis not present

## 2023-03-28 DIAGNOSIS — I350 Nonrheumatic aortic (valve) stenosis: Secondary | ICD-10-CM

## 2023-03-28 DIAGNOSIS — Z952 Presence of prosthetic heart valve: Secondary | ICD-10-CM

## 2023-03-28 LAB — GLUCOSE, CAPILLARY
Glucose-Capillary: 122 mg/dL — ABNORMAL HIGH (ref 70–99)
Glucose-Capillary: 146 mg/dL — ABNORMAL HIGH (ref 70–99)
Glucose-Capillary: 112 mg/dL — ABNORMAL HIGH (ref 70–99)
Glucose-Capillary: 105 mg/dL — ABNORMAL HIGH (ref 70–99)
Glucose-Capillary: 138 mg/dL — ABNORMAL HIGH (ref 70–99)

## 2023-03-28 LAB — BASIC METABOLIC PANEL
Anion gap: 9 (ref 5–15)
BUN: 19 mg/dL (ref 8–23)
CO2: 25 mmol/L (ref 22–32)
Calcium: 8.7 mg/dL — ABNORMAL LOW (ref 8.9–10.3)
Chloride: 104 mmol/L (ref 98–111)
Creatinine, Ser: 1.42 mg/dL — ABNORMAL HIGH (ref 0.61–1.24)
GFR, Estimated: 49 mL/min — ABNORMAL LOW (ref >60)
Glucose, Bld: 126 mg/dL — ABNORMAL HIGH (ref 70–99)
Potassium: 3.7 mmol/L (ref 3.5–5.1)
Sodium: 138 mmol/L (ref 135–145)

## 2023-03-28 LAB — CBC
HCT: 49.2 % (ref 39.0–52.0)
Hemoglobin: 15.6 g/dL (ref 13.0–17.0)
MCH: 28.8 pg (ref 26.0–34.0)
MCHC: 31.7 g/dL (ref 30.0–36.0)
MCV: 90.9 fL (ref 80.0–100.0)
Platelets: 148 10*3/uL — ABNORMAL LOW (ref 150–400)
RBC: 5.41 MIL/uL (ref 4.22–5.81)
RDW: 14.7 % (ref 11.5–15.5)
WBC: 8.3 10*3/uL (ref 4.0–10.5)
nRBC: 0 % (ref 0.0–0.2)

## 2023-03-28 LAB — MAGNESIUM: Magnesium: 2.1 mg/dL (ref 1.7–2.4)

## 2023-03-28 LAB — BRAIN NATRIURETIC PEPTIDE: B Natriuretic Peptide: 752.5 pg/mL — ABNORMAL HIGH (ref 0.0–100.0)

## 2023-03-28 MED ORDER — VITAMIN B-12 1000 MCG PO TABS
1000.0000 ug | ORAL_TABLET | Freq: Every day | ORAL | Status: DC
  Administered 2023-03-29 – 2023-03-31 (×3): 1000 ug via ORAL
  Filled 2023-03-28 (×3): qty 1

## 2023-03-28 MED ORDER — CYANOCOBALAMIN 1000 MCG/ML IJ SOLN
1000.0000 ug | Freq: Once | INTRAMUSCULAR | Status: AC
  Administered 2023-03-28: 1000 ug via INTRAMUSCULAR
  Filled 2023-03-28: qty 1

## 2023-03-28 MED ORDER — INSULIN ASPART 100 UNIT/ML IJ SOLN
0.0000 [IU] | Freq: Four times a day (QID) | INTRAMUSCULAR | Status: DC
  Administered 2023-03-28 – 2023-03-31 (×3): 1 [IU] via SUBCUTANEOUS

## 2023-03-28 NOTE — Progress Notes (Signed)
STROKE TEAM PROGRESS NOTE   INTERVAL HISTORY Wife is at the bedside. Pt sitting in chair, neuro stable, no acute event overnight. 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 Recommend TEE and if unrevealing will do loop.    Vitals:   03/28/23 0500 03/28/23 0906 03/28/23 1231 03/28/23 1626  BP: (!) 193/81 (!) 181/69 (!) 179/73 (!) 167/67  Pulse: 66 67 72 62  Resp: 20 (!) 22 (!) 25 (!) 28  Temp:  98 F (36.7 C) 98.4 F (36.9 C) 98.3 F (36.8 C)  TempSrc: Oral Oral Oral Oral  SpO2:  94%  93%  Weight:      Height:       CBC:  Recent Labs  Lab 03/25/23 1145 03/26/23 0500 03/28/23 0618  WBC 8.1 8.4 8.3  NEUTROABS 5.8  --   --   HGB 16.5 16.6 15.6  HCT 53.0* 51.9 49.2  MCV 91.5 91.7 90.9  PLT 130* 142* 148*   Basic Metabolic Panel:  Recent Labs  Lab 03/26/23 0500 03/28/23 0618  NA 138 138  K 3.6 3.7  CL 103 104  CO2 26 25  GLUCOSE 135* 126*  BUN 20 19  CREATININE 1.37* 1.42*  CALCIUM 8.9 8.7*  MG  --  2.1   Lipid Panel:  Recent Labs  Lab 03/25/23 1145  CHOL 156  TRIG 75  HDL 45  CHOLHDL 3.5  VLDL 15  LDLCALC 96   HgbA1c:  Recent Labs  Lab 03/25/23 1145  HGBA1C 6.7*   Urine Drug Screen:  Recent Labs  Lab 03/25/23 1350  LABOPIA NONE DETECTED  COCAINSCRNUR NONE DETECTED  LABBENZ NONE DETECTED  AMPHETMU NONE DETECTED  THCU NONE DETECTED  LABBARB NONE DETECTED    Alcohol Level  Recent Labs  Lab 03/25/23 1145  ETH <10    IMAGING past 24 hours DG Chest Port 1 View  Result Date: 03/28/2023 CLINICAL DATA:  Shortness of breath EXAM: PORTABLE CHEST 1 VIEW COMPARISON:  10/19/2022 FINDINGS: Normal heart size. Transcatheter aortic valve replacement. Chronic blunting at the lateral left costophrenic sulcus which is from fat by most recent CT. There is no edema, consolidation, effusion, or pneumothorax. IMPRESSION: No acute finding when compared to prior. Electronically  Signed   By: Tiburcio Pea M.D.   On: 03/28/2023 06:19    PHYSICAL EXAM  Temp:  [97.8 F (36.6 C)-98.4 F (36.9 C)] 98.3 F (36.8 C) (04/28 1626) Pulse Rate:  [60-78] 62 (04/28 1626) Resp:  [20-28] 28 (04/28 1626) BP: (162-193)/(67-81) 167/67 (04/28 1626) SpO2:  [93 %-94 %] 93 % (04/28 1626)  General - Well nourished, well developed, in no apparent distress.  Ophthalmologic - fundi not visualized due to noncooperation.  Cardiovascular - Regular rhythm and rate.  Neuro - awake, alert, eyes open, orientated to age, place and people, but not to time. No aphasia, positive speech, following all simple commands, however, mild psychomotor slowing. Able to name and repeat. No gaze palsy, tracking bilaterally, visual field full. No facial droop. Tongue midline. Bilateral UEs 5/5, no drift. Bilaterally LEs 4/5, no drift. Sensation symmetrical bilaterally subjectively, b/l FTN intact grossly, gait not tested.     ASSESSMENT/PLAN Ryan Duke is a 82 y.o. male with history of hypertension, hyperlipidemia, diabetes, CKD, status post TAVR, aortic atherosclerosis with mobile atheroma presenting with frequent falls at home memory difficulty, urinary incontinence, fatigue and generalized weakness.  No TNK given outside  window  Stroke: Multifocal small infarcts, embolic pattern, etiology unclear, could be due to aortic mobile atheroma versus cardioembolic source Code Stroke CT head No acute abnormality. CTA head & neck unremarkable but significant aortic atherosclerosis MRI 4/5 right MCA/PCA punctate infarcts MRI 4/25 small right frontal horn white matter and left MCA/ACA small infarcts in addition to right MCA/PCA punctate infarcts. 2D Echo EF 70 to 75% LE venous Doppler negative for DVT Recommend TEE for further evaluation of TAVR and aortic atheroma If work up unrevealing, will recommend loop recorder prior to discharge. LDL 96 HgbA1c 6.7 UDS negative VTE prophylaxis -Lovenox aspirin  81 mg daily prior to admission, now on aspirin 81 mg daily and clopidogrel 75 mg daily DAPT for 3 weeks and then Plavix alone. 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 Consider TEE for further structural evaluation of prosthetic  valve.  Cardiology on board Pending TEE to rule out TAVR thrombosis  Hypertension Home meds: Losartan 100 mg, atenolol 50 mg BP on the high end Resume home losartan and atenolol Long-term BP goal normotensive  Hyperlipidemia Home meds: Pravastatin 40 mg LDL 96, goal < 70 Changed to Crestor 20 Continue statin at discharge  Diabetes type II  Home meds: Jardiance 25 mg, insulin pen HgbA1c 6.7, goal < 7.0 CBGs SSI Close PCP follow-up as outpatient for better DM control  Other Stroke Risk Factors Advanced Age >/= 15  Obesity, Body mass index is 31.38 kg/m., BMI >/= 30 associated with increased stroke risk, recommend weight loss, diet and exercise as appropriate  Status post TAVR  Other Active Problems CKD 2, creatinine 1.34-1.37-1.42 Mild thrombocytopenia, platelet 130-142 B12 = 213, B12 supplement  Hospital day # 1    Marvel Plan, MD PhD Stroke Neurology 03/28/2023 5:07 PM   To contact Stroke Continuity provider, please refer to [email or web address]. After hours, contact General Neurology

## 2023-03-28 NOTE — Progress Notes (Signed)
PROGRESS NOTE                                                                                                                                                                                                             Patient Demographics:    Ryan Duke, is a 82 y.o. male, DOB - 1941/05/10, ZOX:[id number]  Outpatient Primary MD for the patient is [name], MD    LOS - 1  Admit date - 03/25/2023    Chief Complaint  Patient presents with   Altered Mental Status       Brief Narrative (HPI from H&P)   82 years old Male with PMH significant for insulin-dependent type 2 diabetes, CKD stage IIIa, severe aortic stenosis s/p TAVR, hypertension, hyperlipidemia, BPH presented to the ED for the evaluation of recurrent falls.  Patient reports recurrent fall for last 1 month,  states he lost his balance when walking resulting in falls.  Patient did have an outpatient MRI which showed 4 mm acute subcortical nonhemorrhagic white matter infarct in the posterior right temporal lobe.  In the ER workup again consistent with acute hemic stroke, he was seen by neurology team, stroke workup being done subsequently he has developed severe encephalopathy as well.  He now most likely will require SNF placement.   Subjective:   Patient in bed, appears comfortable, denies any headache, no fever, no chest pain or pressure, no shortness of breath , no abdominal pain. No new focal weakness.   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 He has been seen by neurology team and has undergone full stroke workup, echo pending, currently on dual antiplatelet therapy, LDL above goal hence switched from Lipitor to Crestor, A1c satisfactory, seen by stroke team for now loop recorder and TEE also requested, cardiology informed, plan per  stroke team is dual antiplatelet therapy for 25 days thereafter Plavix alone.   Acute metabolic encephalopathy.  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  With supportive care this has resolved.  He is awake alert and oriented x 3 on 03/28/2023, stable TSH, RPR and HIV status, B12 low normal and placed on oral  supplementation.  Dyslipidemia.  Switch to Crestor for better control.  Hypertension.  For now permissive hypertension due to #1.  Will start titrating with medications after about 4 to 5 days out of acute event.  CKD 3A.  Creatinine appears to be at baseline of 1.3.  Severe aortic stenosis s/p TAVR.  Supportive care for now, TTE noted, TEE-Loop recorder requested along with cardiology consult due to severe leak around the prosthetic TAVR valve.  Multiple falls at home.  Likely due to combination of #1 and 2 above, CT head, CTA head and neck no acute fractures or bleed.  ET, OT and SNF.  Low NML B12 - PO B12  DM type II.  On high doses of Lantus and sliding scale, dose adjusted on 03/27/2023 for better control, poor oral intake hence will monitor cautiously.  Lab Results  Component Value Date   HGBA1C 6.7 (H) 03/25/2023   CBG (last 3)  Recent Labs    03/27/23 1538 03/27/23 2049 03/28/23 0624  GLUCAP 180* 108* 122*         Condition - Extremely Guarded  Family Communication  :  called wife [phone] 03/27/2023 at 7:44 AM and message left, updated on 03/28/23  Code Status :  DNR  Consults  :  Neuro  PUD Prophylaxis :    Procedures  :     TEE -  MRI - 1. 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 2. Normal expected interval evolution of previously identified subcentimeter infarct at the right temporoccipital  junction. No associated hemorrhage or mass effect. 3. Underlying age-appropriate cerebral volume loss with moderate chronic microvascular ischemic disease, with a few scattered remote lacunar infarcts involving the hemispheric cerebral white matter, deep gray nuclei, and cerebellum.  TTE - 1. Left ventricular ejection fraction, by estimation, is 70 to 75%. The left ventricle has hyperdynamic function. Left ventricular endocardial border not optimally defined to evaluate regional wall motion. There is severe left ventricular hypertrophy. Left ventricular diastolic parameters are indeterminate.  2. Right ventricular systolic function is normal. The right ventricular size is normal. Tricuspid regurgitation signal is inadequate for assessing PA pressure.  3. The mitral valve is normal in structure. No evidence of mitral valve regurgitation. No evidence of mitral stenosis.  4. Edwards Sapien 3 Ultra Resilia THV (size 26 mm). Operative note 07/14/2022 indicates after initial valve deployed significant paravalvular leak, a second Edwards Sapien 3 ultra resilia THV 26 mm valve was then also deployed which significantly reduced  paravalvular leak. The gradient across the prosthetic valves has increased since 08/19/22 study(prior mean gradient , up to 30 mmHg on this study). Consider TEE for further structural evaluation of prosthetic valve. . The aortic valve was not well visualized. Aortic valve regurgitation Trivial paravalvular leak.  5. The inferior vena cava is normal in size with greater than 50% respiratory variability, suggesting right atrial pressure of 3 mmHg  EEG - nonspecific encephalopathy.    CT Head and C Spine - 1. No CT evidence of intracranial injury. Sequela of moderate to severe chronic microvascular ischemic change. 2. No acute fracture or traumatic malalignment of the cervical spine. 3. 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  CTA - 1. Negative CTA for large vessel occlusion or other  emergent finding. 2. Mild atheromatous disease for age without hemodynamically significant or correctable stenosis. Aortic Atherosclerosis       Disposition Plan  :    Status  is: Observation  DVT Prophylaxis  :    enoxaparin (LOVENOX) injection 40 mg Start: 03/25/23 2200   Lab Results  Component Value Date   PLT 148 (L) 03/28/2023    Diet :  Diet Order             DIET SOFT Room service appropriate? No; Fluid consistency: Thin  Diet effective now                    Inpatient Medications  Scheduled Meds:  aspirin  81 mg Oral Daily   atenolol  50 mg Oral Daily   clopidogrel  75 mg Oral Daily   vitamin B-12  1,000 mcg Oral Daily   enoxaparin (LOVENOX) injection  40 mg Subcutaneous Q24H   insulin aspart  0-9 Units Subcutaneous Q6H   insulin glargine-yfgn  15 Units Subcutaneous BID   losartan  100 mg Oral Daily   rosuvastatin  20 mg Oral Daily   tamsulosin  0.4 mg Oral Daily   Continuous Infusions: PRN Meds:.acetaminophen **OR** acetaminophen (TYLENOL) oral liquid 160 mg/5 mL **OR** acetaminophen, hydrALAZINE, senna-docusate  Antibiotics  :    Anti-infectives (From admission, onward)    None         Objective:   Vitals:   03/27/23 2000 03/27/23 2025 03/27/23 2300 03/28/23 0500  BP: (!) 190/73 (!) 162/79 (!) 163/68 (!) 193/81  Pulse: 61 60 60 66  Resp: (!) 26 20 20 20   Temp: 97.9 F (36.6 C)  97.8 F (36.6 C)   TempSrc: Oral  Oral Oral  SpO2: 94% 93%    Weight:      Height:        Wt Readings from Last 3 Encounters:  03/25/23 102.1 kg  03/04/23 102.5 kg  01/19/23 105.1 kg     Intake/Output Summary (Last 24 hours) at 03/28/2023 0810 Last data filed at 03/28/2023 0530 Gross per 24 hour  Intake --  Output 900 ml  Net -900 ml     Physical Exam  Awake Alert, No new F.N deficits, Normal affect [place]ney.AT,PERRAL Supple Neck, No JVD,   Symmetrical Chest wall movement, Good air movement bilaterally, CTAB RRR,No Gallops,Rubs or new Murmurs,   +ve B.Sounds, Abd Soft, No tenderness,   No Cyanosis, Clubbing or edema     RN pressure injury documentation: Pressure Injury 03/25/23 Buttocks Left;Right Stage 2 -  Partial thickness loss of dermis presenting as a shallow open injury with a red, pink wound bed without slough. (Active)  03/25/23 2300  Location: Buttocks  Location Orientation: Left;Right  Staging: Stage 2 -  Partial thickness loss of dermis presenting as a shallow open injury with a red, pink wound bed without slough.  Wound Description (Comments):   Present on Admission:   Dressing Type Foam - Lift dressing to assess site every shift 03/27/23 1947      Data Review:    Recent Labs  Lab 03/25/23 1145 03/26/23 0500 03/28/23 0618  WBC 8.1 8.4 8.3  HGB 16.5 16.6 15.6  HCT 53.0* 51.9 49.2  PLT 130* 142* 148*  MCV 91.5 91.7 90.9  MCH 28.5 29.3 28.8  MCHC 31.1 32.0 31.7  RDW 14.7 14.7 14.7  LYMPHSABS 1.4  --   --   MONOABS 0.6  --   --   EOSABS 0.2  --   --   BASOSABS 0.1  --   --     Recent Labs  Lab 03/25/23 1145 03/25/23 1146 03/26/23 0500 03/27/23 0945 03/28/23  0618  NA 138  --  138  --  138  K 3.7  --  3.6  --  3.7  CL 103  --  103  --  104  CO2 25  --  26  --  25  ANIONGAP 10  --  9  --  9  GLUCOSE 119*  --  135*  --  126*  BUN 22  --  20  --  19  CREATININE 1.34*  --  1.37*  --  1.42*  AST 20  --   --   --   --   ALT 13  --   --   --   --   ALKPHOS 78  --   --   --   --   BILITOT 1.0  --   --   --   --   ALBUMIN 3.9  --   --   --   --   TSH  --   --   --  4.606*  --   HGBA1C 6.7*  --   --   --   --   AMMONIA  --  12  --   --   --   BNP  --   --   --   --  752.5*  MG  --   --   --   --  2.1  CALCIUM 9.0  --  8.9  --  8.7*   Lab Results  Component Value Date   CHOL 156 03/25/2023   HDL 45 03/25/2023   LDLCALC 96 03/25/2023   LDLDIRECT 81.0 05/26/2018   TRIG 75 03/25/2023   CHOLHDL 3.5 03/25/2023    Lab Results  Component Value Date   HGBA1C 6.7 (H) 03/25/2023     Radiology  Reports DG Chest Port 1 View  Result Date: 03/28/2023 CLINICAL DATA:  Shortness of breath EXAM: PORTABLE CHEST 1 VIEW COMPARISON:  10/19/2022 FINDINGS: Normal heart size. Transcatheter aortic valve replacement. Chronic blunting at the lateral left costophrenic sulcus which is from fat by most recent CT. There is no edema, consolidation, effusion, or pneumothorax. IMPRESSION: No acute finding when compared to prior. Electronically Signed   By: Tiburcio Pea M.D.   On: 03/28/2023 06:19   ECHOCARDIOGRAM COMPLETE  Result Date: 03/27/2023    ECHOCARDIOGRAM REPORT   Patient Name:   Ryan Duke Date of Exam: 03/26/2023 Medical Rec #:  [id number]       Height:       71.0 in Accession #:    [id number]      Weight:       225.0 lb Date of Birth:  08-07-1941       BSA:          2.217 m Patient Age:    82 years        BP:           180/78 mmHg Patient Gender: M               HR:           84 bpm. Exam Location:  Inpatient Procedure: 2D Echo, Cardiac Doppler and Color Doppler Indications:    stroke  History:        Patient has prior history of Echocardiogram examinations. Risk                 Factors:Diabetes, Dyslipidemia and Hypertension.  Sonographer:    Mike Gip Referring Phys: [id number] [facility] IMPRESSIONS  1. Left ventricular ejection fraction, by estimation, is 70 to 75%. The left ventricle has hyperdynamic function. Left ventricular endocardial border not optimally defined to evaluate regional wall motion. There is severe left ventricular hypertrophy. Left ventricular diastolic parameters are indeterminate.  2. Right ventricular systolic function is normal. The right ventricular size is normal. Tricuspid regurgitation signal is inadequate for assessing PA pressure.  3. The mitral valve is normal in structure. No evidence of mitral valve regurgitation. No evidence of mitral stenosis.  4. Edwards Sapien 3 Ultra Resilia THV (size 26 mm). Operative note 07/14/2022 indicates after initial valve deployed  significant paravalvular leak, a second Edwards Sapien 3 ultra resilia THV 26 mm valve was then also deployed which significantly reduced  paravalvular leak. The gradient across the prosthetic valves has increased since 08/19/22 study(prior mean gradient , up to 30 mmHg on this study). Consider TEE for further structural evaluation of prosthetic valve. . The aortic valve was not well visualized. Aortic valve regurgitation Trivial paravalvular leak.  5. The inferior vena cava is normal in size with greater than 50% respiratory variability, suggesting right atrial pressure of 3 mmHg. FINDINGS  Left Ventricle: Left ventricular ejection fraction, by estimation, is 70 to 75%. The left ventricle has hyperdynamic function. Left ventricular endocardial border not optimally defined to evaluate regional wall motion. The left ventricular internal cavity size was normal in size. There is severe left ventricular hypertrophy. Left ventricular diastolic parameters are indeterminate. Right Ventricle: The right ventricular size is normal. Right vetricular wall thickness was not well visualized. Right ventricular systolic function is normal. Tricuspid regurgitation signal is inadequate for assessing PA pressure. Left Atrium: Left atrial size was normal in size. Right Atrium: Right atrial size was normal in size. Pericardium: There is no evidence of pericardial effusion. Mitral Valve: The mitral valve is normal in structure. No evidence of mitral valve regurgitation. No evidence of mitral valve stenosis. Tricuspid Valve: The tricuspid valve is normal in structure. Tricuspid valve regurgitation is not demonstrated. No evidence of tricuspid stenosis. Aortic Valve: Edwards Sapien 3 Ultra Resilia THV (size 26 mm). Operative note 07/14/2022 indicates after initial valve deployed significant paravalvular leak, a second Edwards Sapien 3 ultra resilia THV 26 mm valve was then also deployed which significantly reduced paravalvular leak. The  gradient across the prosthetic valves has increased since 08/19/22 study(prior mean gradient , up to 30 mmHg on this study). Consider TEE for further structural evaluation of prosthetic valve. The aortic valve was not well visualized. Aortic valve regurgitation Trivial paravalvular leak. Aortic valve mean gradient measures 30.0 mmHg. Aortic valve peak gradient measures 55.1 mmHg. Aortic valve area, by VTI measures 1.55 cm. Pulmonic Valve: The pulmonic valve was not well visualized. Pulmonic valve regurgitation is not visualized. No evidence of pulmonic stenosis. Aorta: The aortic root is normal in size and structure. Venous: The inferior vena cava is normal in size with greater than 50% respiratory variability, suggesting right atrial pressure of 3 mmHg. IAS/Shunts: No atrial level shunt detected by color flow Doppler.  LEFT VENTRICLE PLAX 2D LVIDd:         3.90 cm      Diastology LVIDs:         2.20 cm      LV e' medial:    4.03 cm/s LV PW:         2.10 cm      LV E/e' medial:  10.0 LV IVS:        2.20 cm  LV e' lateral:   4.57 cm/s LVOT diam:     2.10 cm      LV E/e' lateral: 8.9 LV SV:         115 LV SV Index:   52 LVOT Area:     3.46 cm  LV Volumes (MOD) LV vol d, MOD A2C: 101.0 ml LV vol d, MOD A4C: 104.0 ml LV vol s, MOD A2C: 31.0 ml LV vol s, MOD A4C: 26.3 ml LV SV MOD A2C:     70.0 ml LV SV MOD A4C:     104.0 ml LV SV MOD BP:      76.6 ml RIGHT VENTRICLE             IVC RV Basal diam:  4.40 cm     IVC diam: 1.90 cm RV S prime:     13.90 cm/s TAPSE (M-mode): 2.4 cm LEFT ATRIUM             Index        RIGHT ATRIUM           Index LA diam:        3.60 cm 1.62 cm/m   RA Area:     25.40 cm LA Vol (A2C):   83.1 ml 37.49 ml/m  RA Volume:   75.80 ml  34.19 ml/m LA Vol (A4C):   42.4 ml 19.13 ml/m LA Biplane Vol: 59.4 ml 26.80 ml/m  AORTIC VALVE AV Area (Vmax):    1.19 cm AV Area (Vmean):   1.32 cm AV Area (VTI):     1.55 cm AV Vmax:           371.00 cm/s AV Vmean:          256.000 cm/s AV VTI:             0.738 m AV Peak Grad:      55.1 mmHg AV Mean Grad:      30.0 mmHg LVOT Vmax:         128.00 cm/s LVOT Vmean:        97.800 cm/s LVOT VTI:          0.331 m LVOT/AV VTI ratio: 0.45  AORTA Ao Root diam: 3.90 cm Ao Asc diam:  3.10 cm MITRAL VALVE MV Area (PHT): 2.33 cm     SHUNTS MV Decel Time: 326 msec     Systemic VTI:  0.33 m MV E velocity: 40.50 cm/s   Systemic Diam: 2.10 cm MV A velocity: 104.00 cm/s MV E/A ratio:  0.39 Dina Rich MD Electronically signed by Dina Rich MD Signature Date/Time: 03/27/2023/1:59:42 PM    Final    VAS [place] LOWER EXTREMITY VENOUS (DVT)  Result Date: 03/27/2023  Lower Venous DVT Study Patient Name:  Ryan Duke  Date of Exam:   03/27/2023 Medical Rec #: [id number]        Accession #:    [id number] Date of Birth: February 08, 1941        Patient Gender: M Patient Age:   60 years Exam Location:  [facility] Procedure:      VAS [place] LOWER EXTREMITY VENOUS (DVT) Referring Phys: Scheryl Marten XU --------------------------------------------------------------------------------  Indications: Stroke.  Limitations: Poor ultrasound/tissue interface. Comparison Study: Previous exam on 09/06/2017 was negative for DVT Performing Technologist: Ernestene Mention RVT, RDMS  Examination Guidelines: A complete evaluation includes B-mode imaging, spectral Doppler, color Doppler, and power Doppler as needed of all accessible portions of each vessel. Bilateral testing is considered an integral  part of a complete examination. Limited examinations for reoccurring indications may be performed as noted. The reflux portion of the exam is performed with the patient in reverse Trendelenburg.  +---------+---------------+---------+-----------+----------+--------------+ RIGHT    CompressibilityPhasicitySpontaneityPropertiesThrombus Aging +---------+---------------+---------+-----------+----------+--------------+ CFV      Full           Yes      Yes                                  +---------+---------------+---------+-----------+----------+--------------+ SFJ      Full                                                        +---------+---------------+---------+-----------+----------+--------------+ FV Prox  Full           Yes      Yes                                 +---------+---------------+---------+-----------+----------+--------------+ FV Mid   Full           No       Yes        pulsatile                +---------+---------------+---------+-----------+----------+--------------+ FV DistalFull           No       Yes        pulsatile                +---------+---------------+---------+-----------+----------+--------------+ PFV      Full                                                        +---------+---------------+---------+-----------+----------+--------------+ POP      Full           No       Yes        pulsatile                +---------+---------------+---------+-----------+----------+--------------+ PTV      Full                                                        +---------+---------------+---------+-----------+----------+--------------+ PERO     Full                                                        +---------+---------------+---------+-----------+----------+--------------+   +---------+---------------+---------+-----------+----------+-------------------+ LEFT     CompressibilityPhasicitySpontaneityPropertiesThrombus Aging      +---------+---------------+---------+-----------+----------+-------------------+ CFV      Full           Yes      Yes                                      +---------+---------------+---------+-----------+----------+-------------------+  SFJ      Full                                                             +---------+---------------+---------+-----------+----------+-------------------+ FV Prox  Full           Yes      Yes                                       +---------+---------------+---------+-----------+----------+-------------------+ FV Mid   Full           Yes      Yes                                      +---------+---------------+---------+-----------+----------+-------------------+ FV DistalFull           Yes      Yes                                      +---------+---------------+---------+-----------+----------+-------------------+ PFV      Full                                                             +---------+---------------+---------+-----------+----------+-------------------+ POP      Full           Yes      Yes                                      +---------+---------------+---------+-----------+----------+-------------------+ PTV                                                   Not well visualized +---------+---------------+---------+-----------+----------+-------------------+ PERO                                                  Not well visualized +---------+---------------+---------+-----------+----------+-------------------+     Summary: BILATERAL: - No evidence of deep vein thrombosis seen in the lower extremities, bilaterally. -No evidence of popliteal cyst, bilaterally.   *See table(s) above for measurements and observations. Electronically signed by Heath Lark on 03/27/2023 at 1:35:09 PM.    Final    EEG adult  Result Date: 03/26/2023 Charlsie Quest, MD     03/26/2023 12:04 PM Patient Name: Ryan Duke MRN: [id number] Epilepsy Attending: Charlsie Quest Referring Physician/Provider: Gordy Councilman, MD Date: 03/26/2023 Duration: 22.32 mins Patient history: 82yo M getting eeg to evaluate for seizure. Level of alertness: Awake AEDs during EEG study: None Technical aspects: This EEG study was  done with scalp electrodes positioned according to the 10-20 International system of electrode placement. Electrical activity was reviewed with band pass filter of 1-70Hz , sensitivity of 7 uV/mm,  display speed of 12mm/sec with a 60Hz  notched filter applied as appropriate. EEG data were recorded continuously and digitally stored.  Video monitoring was available and reviewed as appropriate. Description: The posterior dominant rhythm consists of 7 Hz activity of moderate voltage (25-35 uV) seen predominantly in posterior head regions, symmetric and reactive to eye opening and eye closing. EEG showed continuous generalized 5 to 7 Hz theta slowing admixed with intermittent 2-3Hz  delta slowing. Hyperventilation and photic stimulation were not performed.   Of note, eeg was technically difficult due to significant electrical artifact. ABNORMALITY - Continuous slow, generalized - Background slow IMPRESSION: This technically difficult study is suggestive of moderate diffuse encephalopathy. No seizures or epileptiform discharges were seen throughout the recording. Charlsie Quest   CT Angio Head W or Wo Contrast  Result Date: 03/25/2023 CLINICAL DATA:  Initial evaluation for stroke. EXAM: CT ANGIOGRAPHY HEAD AND NECK TECHNIQUE: Multidetector CT imaging of the head and neck was performed using the standard protocol during bolus administration of intravenous contrast. Multiplanar CT image reconstructions and MIPs were obtained to evaluate the vascular anatomy. Carotid stenosis measurements (when applicable) are obtained utilizing NASCET criteria, using the distal internal carotid diameter as the denominator. RADIATION DOSE REDUCTION: This exam was performed according to the departmental dose-optimization program which includes automated exposure control, adjustment of the mA and/or kV according to patient size and/or use of iterative reconstruction technique. CONTRAST:  OMNIPAQUE IOHEXOL 350 MG/ML SOLN COMPARISON:  MRI and CT from earlier the same day. FINDINGS: CTA NECK FINDINGS Aortic arch: Visualized aortic arch normal caliber with standard branch pattern. Moderate atheromatous change about the arch itself,  with irregular soft plaque/thrombus protruding into the aortic lumen (series 504, image 23). Right carotid system: Right common and internal carotid arteries are patent without stenosis or dissection. Left carotid system: Left common and internal carotid arteries are patent without stenosis or dissection. Vertebral arteries: Both vertebral arteries arise from the subclavian arteries. No proximal subclavian artery stenosis. Mild atheromatous irregularity about the vertebral arteries without high-grade stenosis or dissection. Skeleton: no discrete or worrisome osseous lesions. Moderate degenerative spondylosis at C4-5 through C6-7. Other neck: No other acute soft tissue abnormality within the neck. Upper chest: Visualized upper chest demonstrates no acute finding. Review of the MIP images confirms the above findings CTA HEAD FINDINGS Anterior circulation: Mild atheromatous change within the carotid siphons without stenosis. A1 segments, anterior communicating complex common anterior cerebral arteries widely patent. No M1 stenosis or occlusion. No proximal MCA branch occlusion or stenosis. Distal MCA branches perfused and symmetric. Posterior circulation: Both V4 segments patent without stenosis. Both PICA patent. 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 Basilar diminutive but widely patent. Superior cerebral arteries patent bilaterally. Predominant fetal type origin of the PCAs. Both PCAs widely patent to their distal aspects without stenosis. Venous sinuses: Grossly patent allowing for timing the contrast bolus. Anatomic variants: As above.  No aneurysm. Review of the MIP images confirms the above findings IMPRESSION: 1. Negative CTA for large vessel occlusion or other emergent finding. 2. Mild atheromatous disease for age without hemodynamically significant or correctable stenosis. Aortic Atherosclerosis (ICD10-I70.0). Electronically Signed   By: Rise Mu M.D.   On: 03/25/2023  22:09   CT Angio Neck W and/or Wo Contrast  Result Date: 03/25/2023 CLINICAL DATA:  Initial evaluation for stroke. EXAM:  CT ANGIOGRAPHY HEAD AND NECK TECHNIQUE: Multidetector CT imaging of the head and neck was performed using the standard protocol during bolus administration of intravenous contrast. Multiplanar CT image reconstructions and MIPs were obtained to evaluate the vascular anatomy. Carotid stenosis measurements (when applicable) are obtained utilizing NASCET criteria, using the distal internal carotid diameter as the denominator. RADIATION DOSE REDUCTION: This exam was performed according to the departmental dose-optimization program which includes automated exposure control, adjustment of the mA and/or kV according to patient size and/or use of iterative reconstruction technique. CONTRAST:  OMNIPAQUE IOHEXOL 350 MG/ML SOLN COMPARISON:  MRI and CT from earlier the same day. FINDINGS: CTA NECK FINDINGS Aortic arch: Visualized aortic arch normal caliber with standard branch pattern. Moderate atheromatous change about the arch itself, with irregular soft plaque/thrombus protruding into the aortic lumen (series 504, image 23). Right carotid system: Right common and internal carotid arteries are patent without stenosis or dissection. Left carotid system: Left common and internal carotid arteries are patent without stenosis or dissection. Vertebral arteries: Both vertebral arteries arise from the subclavian arteries. No proximal subclavian artery stenosis. Mild atheromatous irregularity about the vertebral arteries without high-grade stenosis or dissection. Skeleton: no discrete or worrisome osseous lesions. Moderate degenerative spondylosis at C4-5 through C6-7. Other neck: No other acute soft tissue abnormality within the neck. Upper chest: Visualized upper chest demonstrates no acute finding. Review of the MIP images confirms the above findings CTA HEAD FINDINGS Anterior circulation: Mild  atheromatous change within the carotid siphons without stenosis. A1 segments, anterior communicating complex common anterior cerebral arteries widely patent. No M1 stenosis or occlusion. No proximal MCA branch occlusion or stenosis. Distal MCA branches perfused and symmetric. Posterior circulation: Both V4 segments patent without stenosis. Both PICA patent. 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 Basilar diminutive but widely patent. Superior cerebral arteries patent bilaterally. Predominant fetal type origin of the PCAs. Both PCAs widely patent to their distal aspects without stenosis. Venous sinuses: Grossly patent allowing for timing the contrast bolus. Anatomic variants: As above.  No aneurysm. Review of the MIP images confirms the above findings IMPRESSION: 1. Negative CTA for large vessel occlusion or other emergent finding. 2. Mild atheromatous disease for age without hemodynamically significant or correctable stenosis. Aortic Atherosclerosis (ICD10-I70.0). Electronically Signed   By: Rise Mu M.D.   On: 03/25/2023 22:09   MR BRAIN WO CONTRAST  Result Date: 03/25/2023 CLINICAL DATA:  Initial evaluation for word-finding difficulty, recent stroke. EXAM: MRI HEAD WITHOUT CONTRAST TECHNIQUE: Multiplanar, multiecho pulse sequences of the brain and surrounding structures were obtained without intravenous contrast. COMPARISON:  CT from earlier same day as well as prior MRI from 03/05/2023. FINDINGS: Brain: Age-appropriate cerebral volume loss. Patchy T2/FLAIR hyperintensity involving the periventricular deep white matter both cerebral hemispheres, consistent with moderate chronic microvascular ischemic disease. Few scatter remote lacunar infarcts present about the hemispheric cerebral white matter and deep gray nuclei. Few scattered small remote bilateral cerebellar infarcts noted. Patchy subcentimeter acute ischemic nonhemorrhagic infarct present at the anterior right  frontal corona radiata/basal ganglia (series 9, image 32). Additional subcentimeter acute ischemic nonhemorrhagic subcortical infarct noted at the contralateral left frontal lobe (series 9, image 39). Normal expected interval evolution of previously identified subcentimeter infarct at the right temporal occipital junction (series 9, image 23). No associated hemorrhage or mass effect. No other evidence for acute or subacute ischemia. No acute or chronic intracranial blood products. No mass lesion, mass effect or midline shift. No hydrocephalus or extra-axial fluid collection. Pituitary gland and  suprasellar region within normal limits. Vascular: Major intracranial vascular flow voids are maintained. Skull and upper cervical spine: Craniocervical junction within normal limits. Bone marrow signal intensity normal. No scalp soft tissue abnormality. Sinuses/Orbits: Prior bilateral ocular lens replacement. Paranasal sinuses are largely clear. No significant mastoid effusion. Other: None. IMPRESSION: 1. 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 2. Normal expected interval evolution of previously identified subcentimeter infarct at the right temporoccipital junction. No associated hemorrhage or mass effect. 3. Underlying age-appropriate cerebral volume loss with moderate chronic microvascular ischemic disease, with a few scattered remote lacunar infarcts involving the hemispheric cerebral white matter, deep gray nuclei, and cerebellum. Electronically Signed   By: Rise Mu M.D.   On: 03/25/2023 18:19   CT HEAD WO CONTRAST  Result Date: 03/25/2023 CLINICAL DATA:  Fall EXAM: CT HEAD WITHOUT CONTRAST CT CERVICAL SPINE WITHOUT CONTRAST TECHNIQUE: Multidetector CT imaging of the head and cervical spine was performed following the standard protocol without intravenous contrast. Multiplanar CT  image reconstructions of the cervical spine were also generated. RADIATION DOSE REDUCTION: This exam was performed according to the departmental dose-optimization program which includes automated exposure control, adjustment of the mA and/or kV according to patient size and/or use of iterative reconstruction technique. COMPARISON:  None Available. FINDINGS: CT HEAD FINDINGS Brain: No evidence of acute infarction, hemorrhage, hydrocephalus, extra-axial collection or mass lesion/mass effect. There is sequela of moderate severe chronic microvascular ischemic change. Vascular: No hyperdense vessel or unexpected calcification. Skull: Normal. Negative for fracture or focal lesion. Sinuses/Orbits: No middle ear or mastoid effusion. Paranasal sinuses clear. Bilateral lens replacement. Orbits are otherwise unremarkable Other: None. CT CERVICAL SPINE FINDINGS Alignment: There is straightening of the normal cervical lordosis. Skull base and vertebrae: No acute fracture. No primary bone lesion or focal pathologic process. Soft tissues and spinal canal: No prevertebral fluid or swelling. No visible canal hematoma. 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 Other: None IMPRESSION: 1. No CT evidence of intracranial injury. Sequela of moderate to severe chronic microvascular ischemic change. 2. No acute fracture or traumatic malalignment of the cervical spine. 3. 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 Electronically Signed   By: Lorenza Cambridge M.D.   On: 03/25/2023 13:04   CT Cervical Spine Wo Contrast  Result Date: 03/25/2023 CLINICAL DATA:  Fall EXAM: CT HEAD WITHOUT CONTRAST CT CERVICAL SPINE WITHOUT CONTRAST TECHNIQUE: Multidetector CT imaging of the head and cervical spine was performed following the standard protocol without intravenous contrast. Multiplanar CT image reconstructions of the cervical spine were also generated. RADIATION DOSE REDUCTION: This exam was performed  according to the departmental dose-optimization program which includes automated exposure control, adjustment of the mA and/or kV according to patient size and/or use of iterative reconstruction technique. COMPARISON:  None Available. FINDINGS: CT HEAD FINDINGS Brain: No evidence of acute infarction, hemorrhage, hydrocephalus, extra-axial collection or mass lesion/mass effect. There is sequela of moderate severe chronic microvascular ischemic change. Vascular: No hyperdense vessel or unexpected calcification. Skull: Normal. Negative for fracture or focal lesion. Sinuses/Orbits: No middle ear or mastoid effusion. Paranasal sinuses clear. Bilateral lens replacement. Orbits are otherwise unremarkable Other: None. CT CERVICAL SPINE FINDINGS Alignment: There is straightening of the normal cervical lordosis. Skull base and vertebrae: No acute fracture. No primary bone lesion or focal pathologic process. Soft tissues and spinal canal: No prevertebral fluid or swelling. No visible canal hematoma. Disc levels:  Likely  moderate spinal canal stenosis at C6-C7 Upper chest: Negative. Other: None IMPRESSION: 1. No CT evidence of intracranial injury. Sequela of moderate to severe chronic microvascular ischemic change. 2. No acute fracture or traumatic malalignment of the cervical spine. 3. 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 Electronically Signed   By: Lorenza Cambridge M.D.   On: 03/25/2023 13:04      Signature  -   Susa Raring M.D on 03/28/2023 at 8:10 AM   -  To page go to [email or web address]

## 2023-03-28 NOTE — Consult Note (Addendum)
Cardiology Consultation   Patient ID: Ryan Duke MRN: [id number]; DOB: 08-28-1941  Admit date: 03/25/2023 Date of Consult: 03/28/2023  PCP:  [name], MD    [facility] Prov[facility] Cardiologist:  Tonny Bollman, MD        Patient Profile:   Ryan Duke is a 82 y.o. male with a hx of of HTN, HLD, IDDM, CKD-3b, obesity, severe AS s/p TAVR 07/14/22 now with CVA  and leak of new valve on Echo who is being seen 03/28/2023 for the evaluation of AR at the request of Dr Thedore Mins.  History of Present Illness:   Ryan Duke with hx as above and TAVR 07/14/22  most recent Echo prior to admit 08/19/22 with EF 55-60%, no RWMA, mild LVH G1DD. No MVR, 26 mm Edwards Sapien 3 Ultra  Resilia THV. Mean gradient 9 mmHg, EOA 1.94 cm^2. Mild perivalvular  regurgitation.  Mild aortic dilatation of ascending aorta at 39 mm.   Prior to TAVR his cardiac cath with patent coronary arteries with no significant stenosis,  hx of pulmonary nodules on CTA follow up in 07/2023 by CT.  Last visit 12/2022 pt was stable.   Pt admitted 03/25/23 freq falls over last month, due to loss of balance.  He uses cane or waker to ambulate.  Outpt MRI 03/05/23 with  4 mm acute subcortical nonhemorrhagic white matter infarct in the posterior right temporal lobe. Remote lacunar infarcts of the thalami bilaterally and high left lentiform nucleus as well as cerebellum bilaterally were also noted - he was asked to go to ER with MRI results but he did not.   Pt fell again on 03/24/23 and he was disoriented.  He was weak in his arms. No chest pain no SOB.   He does not take ASA 81 mg daily.  MRI brain without contrast showed patchy subcentimeter acute ischemic nonhemorrhagic infarcts involving the anterior right corona radiata/basal ganglia and contralateral subcortical left frontal lobe. Normal interval evolution of previously identified subcentimeter infarct in the right temporal occipital junction also seen.   Pt was seen by Neuro  and admitted with acute CVA. 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  Non specific EEG.  Yesterday plan was for TEE and loop for CVA - though Echo was ordered as well.    Echo 03/27/23 with EF 70-75%, difficult to eval. regional wall motion RV was normal.  His Aortic valve the gradient across the prosthetic valves has  increased since 08/19/22 study(prior mean gradient , up to 30 mmHg on  this study).  (Edwards Sapien 3 Ultra Resilia THV (size 26 mm). Operative note  07/14/2022 indicates after initial valve deployed significant paravalvular  leak, a second Edwards Sapien 3 ultra resilia THV 26 mm valve was then  also deployed which significantly reduced paravalvular leak. )   Aortic valve regurgitation Trivial  paravalvular leak. Aortic valve mean gradient measures 30.0 mmHg. Aortic  valve peak gradient measures 55.1 mmHg. Aortic valve area, by VTI measures  1.55 cm.   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 Excell Seltzer and Dr. Laneta Simmers   Severe aortic stenosis     Past Surgical History:  Procedure Laterality Date   ALVEOLOPLASTY N/A 05/14/2022   Procedure: ALVEOLOPLASTY;  Surgeon: Sharman Cheek, DMD;  Location: [facility] OR;  Service: Dentistry;  Laterality: N/A;   CARDIAC CATHETERIZATION     EYE SURGERY  2021   cataracts   FLEXIBLE SIGMOIDOSCOPY N/A 09/10/2017   Procedure: FLEXIBLE SIGMOIDOSCOPY;  Surgeon: Napoleon Form, MD;  Location: [facility] ENDOSCOPY;  Service: Endoscopy;  Laterality: N/A;   IR KYPHO LUMBAR INC FX REDUCE BONE BX  UNI/BIL CANNULATION INC/IMAGING  09/14/2017   IR RADIOLOGIST EVAL & MGMT  10/07/2017   RIGHT HEART CATH AND CORONARY ANGIOGRAPHY N/A 05/07/2022   Procedure: RIGHT HEART CATH AND CORONARY ANGIOGRAPHY;  Surgeon: Tonny Bollman, MD;  Location: [facility] INVASIVE CV LAB;  Service: Cardiovascular;  Laterality: N/A;   TEE WITHOUT CARDIOVERSION N/A 07/14/2022   Procedure: TRANSESOPHAGEAL ECHOCARDIOGRAM (TEE);  Surgeon: Tonny Bollman, MD;  Location: [facility] INVASIVE CV LAB;  Service: Open Heart Surgery;  Laterality: N/A;   TONSILLECTOMY     TOOTH EXTRACTION N/A 05/14/2022   Procedure: DENTAL EXTRACTIONS TEETH NUMBER TWO, THREE, FOUR, FIVE, FOURTEEN, FIFTEEN, TWENTY;  Surgeon: Sharman Cheek, DMD;  Location: [facility] OR;  Service: Dentistry;  Laterality: N/A;     Home Medications:  Prior to Admission medications   Medication Sig Start Date End Date Taking? Authorizing Provider  acetaminophen (TYLENOL) 325 MG tablet Take 650 mg by mouth every 6 (six) hours as needed for mild pain or moderate pain (Uses as needed).   Yes [provider]  aspirin 81 MG chewable tablet Chew 1 tablet (81 mg total) by mouth daily. 07/15/22  Yes Janetta Hora, PA-C  atenolol (TENORMIN) 50 MG tablet Take 1 tablet (50 mg total) by mouth daily. 02/18/23  Yes [name], MD  Continuous Blood Gluc Sensor (FREESTYLE LIBRE 2 SENSOR) MISC USE AS DIRECTED AND REPLACE EVERY 14 DAYS. 09/22/22  Yes [name], MD  DULoxetine (CYMBALTA) 60 MG capsule TAKE ONE CAPSULE DAILY 11/09/22  Yes [name], MD  fluticasone [facility]) 50 MCG/ACT nasal spray Place 1 spray into both nostrils daily as needed for allergies or rhinitis.   Yes [provider]  insulin glargine (LANTUS SOLOSTAR) 100 UNIT/ML Solostar Pen Inject 30 Units into the skin daily. 02/10/23  Yes [name], MD  JARDIANCE 25 MG TABS tablet TAKE ONE TABLET BY MOUTH ONCE DAILY BEFORE BREAKFAST 01/11/23  Yes [name], MD  KERENDIA 20 MG TABS Take 1  tablet by mouth daily. 02/15/23  Yes [name], MD  losartan (COZAAR) 100 MG tablet Take 1 tablet (100 mg total) by mouth daily for high blood pressure 09/25/22  Yes [name], MD  Multiple Vitamin (MULTIVITAMIN WITH MINERALS) TABS tablet Take 1 tablet by mouth daily.   Yes [provider]  Multiple Vitamins-Minerals (PRESERVISION AREDS PO) Take 1 tablet by mouth in the morning and at bedtime.   Yes [provider]  pravastatin (PRAVACHOL) 40 MG tablet TAKE ONE TABLET BY MOUTH ONCE DAILY 02/23/23  Yes [name], MD  pregabalin (LYRICA) 150 MG capsule Take 150 mg by mouth See admin instructions. Take 150 mg daily, may take an additional 150 mg twice daily as needed for pain 03/26/20  Yes [provider]  tamsulosin (FLOMAX) 0.4 MG CAPS capsule TAKE ONE CAPSULE BY MOUTH DAILY 12/20/22  Yes [name], MD  vitamin B-12 (CYANOCOBALAMIN) 500 MCG tablet Take 500 mcg by mouth daily.   Yes [provider]  amoxicillin (AMOXIL) 500 MG tablet Take 4 tablets (2,000 mg total) by mouth as directed. 1 hour prior to dental work including cleanings 07/24/22   Janetta Hora, PA-C  Continuous Blood Gluc Receiver (FREESTYLE LIBRE 2 READER) DEVI USE AS DIRECTED. 08/04/22   [name], MD  Insulin Pen Needle (NOVOFINE) 32G X 6 MM MISC 1 Act by Does not apply route daily. 06/04/20   [name], MD    Inpatient Medications: Scheduled Meds:  aspirin  81 mg Oral Daily   atenolol  50 mg Oral Daily   clopidogrel  75 mg Oral Daily   cyanocobalamin  1,000 mcg Intramuscular Once   [START ON 03/29/2023] vitamin B-12  1,000 mcg Oral Daily   enoxaparin (LOVENOX) injection  40 mg Subcutaneous Q24H   insulin aspart  0-9 Units Subcutaneous Q6H   insulin glargine-yfgn  15 Units Subcutaneous BID   losartan  100 mg Oral Daily   rosuvastatin  20 mg Oral Daily   tamsulosin  0.4 mg Oral Daily   Continuous Infusions:  PRN Meds: acetaminophen **OR** acetaminophen  (TYLENOL) oral liquid 160 mg/5 mL **OR** acetaminophen, hydrALAZINE, senna-docusate  Allergies:    Allergies  Allergen Reactions   Farxiga [Dapagliflozin] Other (See Comments)    Gout    Metformin And Related Other (See Comments)    "Acidosis"   Atorvastatin Other (See Comments)    Headaches    Benazepril Hcl Other (See Comments)    Was not effective   Flexeril [Cyclobenzaprine] Other (See Comments)    Hallucinations    Oxycodone Other (See Comments)    Hallucinations     Social History:   Social History   Socioeconomic History   Marital status: Married    Spouse name: Not on file   Number of children: Not on file   Years of education: 14   Highest education level: Some college, no degree  Occupational History   Occupation: retired  Tobacco Use   Smoking status: Former    Types: Cigarettes    Quit date: 11/30/1974    Years since quitting: 48.3    Passive exposure: Never   Smokeless tobacco: Never  Vaping Use   Vaping Use: Never used  Substance and Sexual Activity   Alcohol use: Not Currently    Alcohol/week: 0.0 standard drinks of alcohol   Drug use: No   Sexual activity: Yes    Partners: Female  Other Topics Concern   Not on file  Social History Narrative   Not on file   Social Determinants of Health   Financial Resource Strain: Low Risk  (03/02/2022)   Overall Financial Resource Strain (CARDIA)    Difficulty of Paying Living Expenses: Not hard at all  Food Insecurity: No Food Insecurity (03/02/2022)   Hunger Vital Sign    Worried About Running Out of Food in the Last Year: Never true    Ran Out of Food in the Last Year: Never true  Transportation Needs: No Transportation Needs (03/02/2022)   PRAPARE - Administrator, Civil Service (Medical): No    Lack of Transportation (Non-Medical): No  Physical Activity: Insufficiently Active (03/02/2022)   Exercise Vital Sign    Days of Exercise per Week: 2 days    Minutes of  Exercise per Session: 20 min   Stress: No Stress Concern Present (03/02/2022)   [employer] of Occupational Health - Occupational Stress Questionnaire    Feeling of Stress : Not at all  Social Connections: Moderately Isolated (03/02/2022)   Social Connection and Isolation Panel [NHANES]    Frequency of Communication with Friends and Family: Twice a week    Frequency of Social Gatherings with Friends and Family: Twice a week    Attends Religious Services: Never    Database administrator or Organizations: No    Attends Banker Meetings: Never    Marital Status: Married  Catering manager Violence: Not At Risk (03/02/2022)   Humiliation, Afraid, Rape, and Kick questionnaire    Fear of Current or Ex-Partner: No    Emotionally Abused: No    Physically Abused: No    Sexually Abused: No    Family History:    Family History  Problem Relation Age of Onset   Hypertension Other      ROS:  Please see the history of present illness.  General:no colds or fevers, no weight changes Skin:no rashes or ulcers HEENT:no blurred vision, no congestion CV:see HPI PUL:see HPI GI:no diarrhea constipation or melena, no indigestion GU:no hematuria, no dysuria MS:no joint pain, no claudication Neuro:no syncope, no lightheadedness + CVA Endo:+ diabetes, no thyroid disease  All other ROS reviewed and negative.     Physical Exam/Data:   Vitals:   03/27/23 2000 03/27/23 2025 03/27/23 2300 03/28/23 0500  BP: (!) 190/73 (!) 162/79 (!) 163/68 (!) 193/81  Pulse: 61 60 60 66  Resp: (!) 26 20 20 20   Temp: 97.9 F (36.6 C)  97.8 F (36.6 C)   TempSrc: Oral  Oral Oral  SpO2: 94% 93%    Weight:      Height:        Intake/Output Summary (Last 24 hours) at 03/28/2023 0907 Last data filed at 03/28/2023 0530 Gross per 24 hour  Intake --  Output 900 ml  Net -900 ml      03/25/2023   10:52 AM 03/04/2023    1:40 PM 01/19/2023   10:58 AM  Last 3 Weights  Weight (lbs) 225 lb 226 lb 231 lb 12.8 oz  Weight (kg) 102.059 kg  102.513 kg 105.144 kg     Body mass index is 31.38 kg/m.  Exam per Dr. Royann Shivers   EKG:  The EKG was personally reviewed and demonstrates:  no acute changes from 07/24/22  Telemetry:  Telemetry was personally reviewed and demonstrates:  SR  Relevant CV Studies: Echo 03/27/23 IMPRESSIONS     1. Left ventricular ejection fraction, by estimation, is 70 to 75%. The  left ventricle has hyperdynamic function. Left ventricular endocardial  border not optimally defined to evaluate regional wall motion. There is  severe left ventricular hypertrophy.  Left ventricular diastolic parameters are indeterminate.   2. Right ventricular systolic function is normal. The right ventricular  size is normal. Tricuspid regurgitation signal is inadequate for assessing  PA pressure.   3. The mitral valve is normal in structure. No evidence of mitral valve  regurgitation. No evidence of mitral stenosis.   4. Edwards Sapien 3 Ultra Resilia THV (size 26 mm). Operative note  07/14/2022 indicates after initial valve deployed significant paravalvular  leak, a second Edwards Sapien 3 ultra resilia THV 26 mm valve was then  also deployed which significantly reduced   paravalvular leak. The gradient across the prosthetic valves has  increased since  08/19/22 study(prior mean gradient , up to 30 mmHg on  this study). Consider TEE for further structural evaluation of prosthetic  valve. . The aortic valve was not well  visualized. Aortic valve regurgitation Trivial paravalvular leak.   5. The inferior vena cava is normal in size with greater than 50%  respiratory variability, suggesting right atrial pressure of 3 mmHg.   FINDINGS   Left Ventricle: Left ventricular ejection fraction, by estimation, is 70  to 75%. The left ventricle has hyperdynamic function. Left ventricular  endocardial border not optimally defined to evaluate regional wall motion.  The left ventricular internal  cavity size was normal in size.  There is severe left ventricular  hypertrophy. Left ventricular diastolic parameters are indeterminate.   Right Ventricle: The right ventricular size is normal. Right vetricular  wall thickness was not well visualized. Right ventricular systolic  function is normal. Tricuspid regurgitation signal is inadequate for  assessing PA pressure.   Left Atrium: Left atrial size was normal in size.   Right Atrium: Right atrial size was normal in size.   Pericardium: There is no evidence of pericardial effusion.   Mitral Valve: The mitral valve is normal in structure. No evidence of  mitral valve regurgitation. No evidence of mitral valve stenosis.   Tricuspid Valve: The tricuspid valve is normal in structure. Tricuspid  valve regurgitation is not demonstrated. No evidence of tricuspid  stenosis.   Aortic Valve: Edwards Sapien 3 Ultra Resilia THV (size 26 mm). Operative  note 07/14/2022 indicates after initial valve deployed significant  paravalvular leak, a second Edwards Sapien 3 ultra resilia THV 26 mm valve  was then also deployed which  significantly reduced paravalvular leak. The gradient across the  prosthetic valves has increased since 08/19/22 study(prior mean gradient  , up to 30 mmHg on this study). Consider TEE for further structural  evaluation of prosthetic valve. The aortic  valve was not well visualized. Aortic valve regurgitation Trivial  paravalvular leak. Aortic valve mean gradient measures 30.0 mmHg. Aortic  valve peak gradient measures 55.1 mmHg. Aortic valve area, by VTI measures  1.55 cm.   Pulmonic Valve: The pulmonic valve was not well visualized. Pulmonic valve  regurgitation is not visualized. No evidence of pulmonic stenosis.   Aorta: The aortic root is normal in size and structure.   Venous: The inferior vena cava is normal in size with greater than 50%  respiratory variability, suggesting right atrial pressure of 3 mmHg.   IAS/Shunts: No atrial level  shunt detected by color flow Doppler.    Echo 08/19/22 IMPRESSIONS     1. Left ventricular ejection fraction, by estimation, is 55 to 60%. The  left ventricle has normal function. The left ventricle has no regional  wall motion abnormalities. There is mild left ventricular hypertrophy.  Left ventricular diastolic parameters  are consistent with Grade I diastolic dysfunction (impaired relaxation).   2. Right ventricular systolic function is normal. The right ventricular  size is normal. Tricuspid regurgitation signal is inadequate for assessing  PA pressure.   3. The mitral valve is normal in structure. No evidence of mitral valve  regurgitation. No evidence of mitral stenosis.   4. Bioprosthetic aortic valve s/p TAVR. 26 mm Edwards Sapien 3 Ultra  Resilia THV. Mean gradient 9 mmHg, EOA 1.94 cm^2. Mild perivalvular  regurgitation.   5. Aortic dilatation noted. There is mild dilatation of the ascending  aorta, measuring 39 mm.   6. The inferior vena cava is normal in size with  greater than 50%  respiratory variability, suggesting right atrial pressure of 3 mmHg.   FINDINGS   Left Ventricle: Left ventricular ejection fraction, by estimation, is 55  to 60%. The left ventricle has normal function. The left ventricle has no  regional wall motion abnormalities. The left ventricular internal cavity  size was normal in size. There is   mild left ventricular hypertrophy. Left ventricular diastolic parameters  are consistent with Grade I diastolic dysfunction (impaired relaxation).   Right Ventricle: The right ventricular size is normal. No increase in  right ventricular wall thickness. Right ventricular systolic function is  normal. Tricuspid regurgitation signal is inadequate for assessing PA  pressure.   Left Atrium: Left atrial size was normal in size.   Right Atrium: Right atrial size was normal in size.   Pericardium: There is no evidence of pericardial effusion.   Mitral Valve:  The mitral valve is normal in structure. There is mild  calcification of the mitral valve leaflet(s). No evidence of mitral valve  regurgitation. No evidence of mitral valve stenosis.   Tricuspid Valve: The tricuspid valve is normal in structure. Tricuspid  valve regurgitation is not demonstrated.   Aortic Valve: Bioprosthetic aortic valve s/p TAVR. 26 mm Edwards Sapien 3  Ultra Resilia THV. Mean gradient 9 mmHg, EOA 1.94 cm^2. Mild perivalvular  regurgitation. The aortic valve has been repaired/replaced. There is mild  calcification of the aortic  valve. Aortic valve regurgitation is mild. Aortic regurgitation PHT  measures 417 msec. Aortic valve mean gradient measures 9.0 mmHg. Aortic  valve peak gradient measures 17.6 mmHg. Aortic valve area, by VTI measures  1.94 cm.   Pulmonic Valve: The pulmonic valve was normal in structure. Pulmonic valve  regurgitation is not visualized.   Aorta: Aortic dilatation noted. There is mild dilatation of the ascending  aorta, measuring 39 mm.   Venous: The inferior vena cava is normal in size with greater than 50%  respiratory variability, suggesting right atrial pressure of 3 mmHg.   IAS/Shunts: No atrial level shunt detected by color flow Doppler.    Laboratory Data:  High Sensitivity Troponin:  No results for input(s): "TROPONINIHS" in the last 720 hours.   Chemistry Recent Labs  Lab 03/25/23 1145 03/26/23 0500 03/28/23 0618  NA 138 138 138  K 3.7 3.6 3.7  CL 103 103 104  CO2 25 26 25   GLUCOSE 119* 135* 126*  BUN 22 20 19   CREATININE 1.34* 1.37* 1.42*  CALCIUM 9.0 8.9 8.7*  MG  --   --  2.1  GFRNONAA 52* 51* 49*  ANIONGAP 10 9 9     Recent Labs  Lab 03/25/23 1145  PROT 6.8  ALBUMIN 3.9  AST 20  ALT 13  ALKPHOS 78  BILITOT 1.0   Lipids  Recent Labs  Lab 03/25/23 1145  CHOL 156  TRIG 75  HDL 45  LDLCALC 96  CHOLHDL 3.5    Hematology Recent Labs  Lab 03/25/23 1145 03/26/23 0500 03/28/23 0618  WBC 8.1 8.4  8.3  RBC 5.79 5.66 5.41  HGB 16.5 16.6 15.6  HCT 53.0* 51.9 49.2  MCV 91.5 91.7 90.9  MCH 28.5 29.3 28.8  MCHC 31.1 32.0 31.7  RDW 14.7 14.7 14.7  PLT 130* 142* 148*   Thyroid  Recent Labs  Lab 03/27/23 0945  TSH 4.606*    BNP Recent Labs  Lab 03/28/23 0618  BNP 752.5*    DDimer No results for input(s): "DDIMER" in the last 168 hours.  Radiology/Studies:  DG Chest Port 1 View  Result Date: 03/28/2023 CLINICAL DATA:  Shortness of breath EXAM: PORTABLE CHEST 1 VIEW COMPARISON:  10/19/2022 FINDINGS: Normal heart size. Transcatheter aortic valve replacement. Chronic blunting at the lateral left costophrenic sulcus which is from fat by most recent CT. There is no edema, consolidation, effusion, or pneumothorax. IMPRESSION: No acute finding when compared to prior. Electronically Signed   By: Tiburcio Pea M.D.   On: 03/28/2023 06:19   ECHOCARDIOGRAM COMPLETE  Result Date: 03/27/2023    ECHOCARDIOGRAM REPORT   Patient Name:   Ryan Duke Date of Exam: 03/26/2023 Medical Rec #:  [id number]       Height:       71.0 in Accession #:    [id number]      Weight:       225.0 lb Date of Birth:  Apr 15, 1941       BSA:          2.217 m Patient Age:    82 years        BP:           180/78 mmHg Patient Gender: M               HR:           84 bpm. Exam Location:  Inpatient Procedure: 2D Echo, Cardiac Doppler and Color Doppler Indications:    stroke  History:        Patient has prior history of Echocardiogram examinations. Risk                 Factors:Diabetes, Dyslipidemia and Hypertension.  Sonographer:    Mike Gip Referring Phys: [id number] [facility] IMPRESSIONS  1. Left ventricular ejection fraction, by estimation, is 70 to 75%. The left ventricle has hyperdynamic function. Left ventricular endocardial border not optimally defined to evaluate regional wall motion. There is severe left ventricular hypertrophy. Left ventricular diastolic parameters are indeterminate.  2. Right ventricular  systolic function is normal. The right ventricular size is normal. Tricuspid regurgitation signal is inadequate for assessing PA pressure.  3. The mitral valve is normal in structure. No evidence of mitral valve regurgitation. No evidence of mitral stenosis.  4. Edwards Sapien 3 Ultra Resilia THV (size 26 mm). Operative note 07/14/2022 indicates after initial valve deployed significant paravalvular leak, a second Edwards Sapien 3 ultra resilia THV 26 mm valve was then also deployed which significantly reduced  paravalvular leak. The gradient across the prosthetic valves has increased since 08/19/22 study(prior mean gradient , up to 30 mmHg on this study). Consider TEE for further structural evaluation of prosthetic valve. . The aortic valve was not well visualized. Aortic valve regurgitation Trivial paravalvular leak.  5. The inferior vena cava is normal in size with greater than 50% respiratory variability, suggesting right atrial pressure of 3 mmHg. FINDINGS  Left Ventricle: Left ventricular ejection fraction, by estimation, is 70 to 75%. The left ventricle has hyperdynamic function. Left ventricular endocardial border not optimally defined to evaluate regional wall motion. The left ventricular internal cavity size was normal in size. There is severe left ventricular hypertrophy. Left ventricular diastolic parameters are indeterminate. Right Ventricle: The right ventricular size is normal. Right vetricular wall thickness was not well visualized. Right ventricular systolic function is normal. Tricuspid regurgitation signal is inadequate for assessing PA pressure. Left Atrium: Left atrial size was normal in size. Right Atrium: Right atrial size was normal in size. Pericardium: There is no evidence of pericardial effusion. Mitral  Valve: The mitral valve is normal in structure. No evidence of mitral valve regurgitation. No evidence of mitral valve stenosis. Tricuspid Valve: The tricuspid valve is normal in  structure. Tricuspid valve regurgitation is not demonstrated. No evidence of tricuspid stenosis. Aortic Valve: Edwards Sapien 3 Ultra Resilia THV (size 26 mm). Operative note 07/14/2022 indicates after initial valve deployed significant paravalvular leak, a second Edwards Sapien 3 ultra resilia THV 26 mm valve was then also deployed which significantly reduced paravalvular leak. The gradient across the prosthetic valves has increased since 08/19/22 study(prior mean gradient , up to 30 mmHg on this study). Consider TEE for further structural evaluation of prosthetic valve. The aortic valve was not well visualized. Aortic valve regurgitation Trivial paravalvular leak. Aortic valve mean gradient measures 30.0 mmHg. Aortic valve peak gradient measures 55.1 mmHg. Aortic valve area, by VTI measures 1.55 cm. Pulmonic Valve: The pulmonic valve was not well visualized. Pulmonic valve regurgitation is not visualized. No evidence of pulmonic stenosis. Aorta: The aortic root is normal in size and structure. Venous: The inferior vena cava is normal in size with greater than 50% respiratory variability, suggesting right atrial pressure of 3 mmHg. IAS/Shunts: No atrial level shunt detected by color flow Doppler.  LEFT VENTRICLE PLAX 2D LVIDd:         3.90 cm      Diastology LVIDs:         2.20 cm      LV e' medial:    4.03 cm/s LV PW:         2.10 cm      LV E/e' medial:  10.0 LV IVS:        2.20 cm      LV e' lateral:   4.57 cm/s LVOT diam:     2.10 cm      LV E/e' lateral: 8.9 LV SV:         115 LV SV Index:   52 LVOT Area:     3.46 cm  LV Volumes (MOD) LV vol d, MOD A2C: 101.0 ml LV vol d, MOD A4C: 104.0 ml LV vol s, MOD A2C: 31.0 ml LV vol s, MOD A4C: 26.3 ml LV SV MOD A2C:     70.0 ml LV SV MOD A4C:     104.0 ml LV SV MOD BP:      76.6 ml RIGHT VENTRICLE             IVC RV Basal diam:  4.40 cm     IVC diam: 1.90 cm RV S prime:     13.90 cm/s TAPSE (M-mode): 2.4 cm LEFT ATRIUM             Index        RIGHT ATRIUM            Index LA diam:        3.60 cm 1.62 cm/m   RA Area:     25.40 cm LA Vol (A2C):   83.1 ml 37.49 ml/m  RA Volume:   75.80 ml  34.19 ml/m LA Vol (A4C):   42.4 ml 19.13 ml/m LA Biplane Vol: 59.4 ml 26.80 ml/m  AORTIC VALVE AV Area (Vmax):    1.19 cm AV Area (Vmean):   1.32 cm AV Area (VTI):     1.55 cm AV Vmax:           371.00 cm/s AV Vmean:          256.000 cm/s AV VTI:  0.738 m AV Peak Grad:      55.1 mmHg AV Mean Grad:      30.0 mmHg LVOT Vmax:         128.00 cm/s LVOT Vmean:        97.800 cm/s LVOT VTI:          0.331 m LVOT/AV VTI ratio: 0.45  AORTA Ao Root diam: 3.90 cm Ao Asc diam:  3.10 cm MITRAL VALVE MV Area (PHT): 2.33 cm     SHUNTS MV Decel Time: 326 msec     Systemic VTI:  0.33 m MV E velocity: 40.50 cm/s   Systemic Diam: 2.10 cm MV A velocity: 104.00 cm/s MV E/A ratio:  0.39 Dina Rich MD Electronically signed by Dina Rich MD Signature Date/Time: 03/27/2023/1:59:42 PM    Final    VAS [place] LOWER EXTREMITY VENOUS (DVT)  Result Date: 03/27/2023  Lower Venous DVT Study Patient Name:  Ryan Duke  Date of Exam:   03/27/2023 Medical Rec #: [id number]        Accession #:    [id number] Date of Birth: [birth year]        Patient Gender: M Patient Age:   72 years Exam Location:  [facility] Procedure:      VAS [place] LOWER EXTREMITY VENOUS (DVT) Referring Phys: Scheryl Marten XU --------------------------------------------------------------------------------  Indications: Stroke.  Limitations: Poor ultrasound/tissue interface. Comparison Study: Previous exam on 09/06/2017 was negative for DVT Performing Technologist: Ernestene Mention RVT, RDMS  Examination Guidelines: A complete evaluation includes B-mode imaging, spectral Doppler, color Doppler, and power Doppler as needed of all accessible portions of each vessel. Bilateral testing is considered an integral part of a complete examination. Limited examinations for reoccurring indications may be performed as noted. The reflux portion of the exam  is performed with the patient in reverse Trendelenburg.  +---------+---------------+---------+-----------+----------+--------------+ RIGHT    CompressibilityPhasicitySpontaneityPropertiesThrombus Aging +---------+---------------+---------+-----------+----------+--------------+ CFV      Full           Yes      Yes                                 +---------+---------------+---------+-----------+----------+--------------+ SFJ      Full                                                        +---------+---------------+---------+-----------+----------+--------------+ FV Prox  Full           Yes      Yes                                 +---------+---------------+---------+-----------+----------+--------------+ FV Mid   Full           No       Yes        pulsatile                +---------+---------------+---------+-----------+----------+--------------+ FV DistalFull           No       Yes        pulsatile                +---------+---------------+---------+-----------+----------+--------------+ PFV      Full                                                        +---------+---------------+---------+-----------+----------+--------------+  POP      Full           No       Yes        pulsatile                +---------+---------------+---------+-----------+----------+--------------+ PTV      Full                                                        +---------+---------------+---------+-----------+----------+--------------+ PERO     Full                                                        +---------+---------------+---------+-----------+----------+--------------+   +---------+---------------+---------+-----------+----------+-------------------+ LEFT     CompressibilityPhasicitySpontaneityPropertiesThrombus Aging      +---------+---------------+---------+-----------+----------+-------------------+ CFV      Full           Yes      Yes                                       +---------+---------------+---------+-----------+----------+-------------------+ SFJ      Full                                                             +---------+---------------+---------+-----------+----------+-------------------+ FV Prox  Full           Yes      Yes                                      +---------+---------------+---------+-----------+----------+-------------------+ FV Mid   Full           Yes      Yes                                      +---------+---------------+---------+-----------+----------+-------------------+ FV DistalFull           Yes      Yes                                      +---------+---------------+---------+-----------+----------+-------------------+ PFV      Full                                                             +---------+---------------+---------+-----------+----------+-------------------+ POP      Full           Yes      Yes                                      +---------+---------------+---------+-----------+----------+-------------------+  PTV                                                   Not well visualized +---------+---------------+---------+-----------+----------+-------------------+ PERO                                                  Not well visualized +---------+---------------+---------+-----------+----------+-------------------+     Summary: BILATERAL: - No evidence of deep vein thrombosis seen in the lower extremities, bilaterally. -No evidence of popliteal cyst, bilaterally.   *See table(s) above for measurements and observations. Electronically signed by Heath Lark on 03/27/2023 at 1:35:09 PM.    Final    EEG adult  Result Date: 03/26/2023 Charlsie Quest, MD     03/26/2023 12:04 PM Patient Name: Ryan Duke MRN: [id number] Epilepsy Attending: Charlsie Quest Referring Physician/Provider: Gordy Councilman, MD Date: 03/26/2023 Duration: 22.32 mins  Patient history: 82yo M getting eeg to evaluate for seizure. Level of alertness: Awake AEDs during EEG study: None Technical aspects: This EEG study was done with scalp electrodes positioned according to the 10-20 International system of electrode placement. Electrical activity was reviewed with band pass filter of 1-70Hz , sensitivity of 7 uV/mm, display speed of 36mm/sec with a 60Hz  notched filter applied as appropriate. EEG data were recorded continuously and digitally stored.  Video monitoring was available and reviewed as appropriate. Description: The posterior dominant rhythm consists of 7 Hz activity of moderate voltage (25-35 uV) seen predominantly in posterior head regions, symmetric and reactive to eye opening and eye closing. EEG showed continuous generalized 5 to 7 Hz theta slowing admixed with intermittent 2-3Hz  delta slowing. Hyperventilation and photic stimulation were not performed.   Of note, eeg was technically difficult due to significant electrical artifact. ABNORMALITY - Continuous slow, generalized - Background slow IMPRESSION: This technically difficult study is suggestive of moderate diffuse encephalopathy. No seizures or epileptiform discharges were seen throughout the recording. Charlsie Quest   CT Angio Head W or Wo Contrast  Result Date: 03/25/2023 CLINICAL DATA:  Initial evaluation for stroke. EXAM: CT ANGIOGRAPHY HEAD AND NECK TECHNIQUE: Multidetector CT imaging of the head and neck was performed using the standard protocol during bolus administration of intravenous contrast. Multiplanar CT image reconstructions and MIPs were obtained to evaluate the vascular anatomy. Carotid stenosis measurements (when applicable) are obtained utilizing NASCET criteria, using the distal internal carotid diameter as the denominator. RADIATION DOSE REDUCTION: This exam was performed according to the departmental dose-optimization program which includes automated exposure control, adjustment of the  mA and/or kV according to patient size and/or use of iterative reconstruction technique. CONTRAST:  OMNIPAQUE IOHEXOL 350 MG/ML SOLN COMPARISON:  MRI and CT from earlier the same day. FINDINGS: CTA NECK FINDINGS Aortic arch: Visualized aortic arch normal caliber with standard branch pattern. Moderate atheromatous change about the arch itself, with irregular soft plaque/thrombus protruding into the aortic lumen (series 504, image 23). Right carotid system: Right common and internal carotid arteries are patent without stenosis or dissection. Left carotid system: Left common and internal carotid arteries are patent without stenosis or dissection. Vertebral arteries: Both vertebral arteries arise from the subclavian arteries. No proximal subclavian artery stenosis. Mild atheromatous irregularity about the vertebral arteries  without high-grade stenosis or dissection. Skeleton: no discrete or worrisome osseous lesions. Moderate degenerative spondylosis at C4-5 through C6-7. Other neck: No other acute soft tissue abnormality within the neck. Upper chest: Visualized upper chest demonstrates no acute finding. Review of the MIP images confirms the above findings CTA HEAD FINDINGS Anterior circulation: Mild atheromatous change within the carotid siphons without stenosis. A1 segments, anterior communicating complex common anterior cerebral arteries widely patent. No M1 stenosis or occlusion. No proximal MCA branch occlusion or stenosis. Distal MCA branches perfused and symmetric. Posterior circulation: Both V4 segments patent without stenosis. Both PICA patent. 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 Basilar diminutive but widely patent. Superior cerebral arteries patent bilaterally. Predominant fetal type origin of the PCAs. Both PCAs widely patent to their distal aspects without stenosis. Venous sinuses: Grossly patent allowing for timing the contrast bolus. Anatomic variants: As above.  No  aneurysm. Review of the MIP images confirms the above findings IMPRESSION: 1. Negative CTA for large vessel occlusion or other emergent finding. 2. Mild atheromatous disease for age without hemodynamically significant or correctable stenosis. Aortic Atherosclerosis (ICD10-I70.0). Electronically Signed   By: Rise Mu M.D.   On: 03/25/2023 22:09   CT Angio Neck W and/or Wo Contrast  Result Date: 03/25/2023 CLINICAL DATA:  Initial evaluation for stroke. EXAM: CT ANGIOGRAPHY HEAD AND NECK TECHNIQUE: Multidetector CT imaging of the head and neck was performed using the standard protocol during bolus administration of intravenous contrast. Multiplanar CT image reconstructions and MIPs were obtained to evaluate the vascular anatomy. Carotid stenosis measurements (when applicable) are obtained utilizing NASCET criteria, using the distal internal carotid diameter as the denominator. RADIATION DOSE REDUCTION: This exam was performed according to the departmental dose-optimization program which includes automated exposure control, adjustment of the mA and/or kV according to patient size and/or use of iterative reconstruction technique. CONTRAST:  OMNIPAQUE IOHEXOL 350 MG/ML SOLN COMPARISON:  MRI and CT from earlier the same day. FINDINGS: CTA NECK FINDINGS Aortic arch: Visualized aortic arch normal caliber with standard branch pattern. Moderate atheromatous change about the arch itself, with irregular soft plaque/thrombus protruding into the aortic lumen (series 504, image 23). Right carotid system: Right common and internal carotid arteries are patent without stenosis or dissection. Left carotid system: Left common and internal carotid arteries are patent without stenosis or dissection. Vertebral arteries: Both vertebral arteries arise from the subclavian arteries. No proximal subclavian artery stenosis. Mild atheromatous irregularity about the vertebral arteries without high-grade stenosis or  dissection. Skeleton: no discrete or worrisome osseous lesions. Moderate degenerative spondylosis at C4-5 through C6-7. Other neck: No other acute soft tissue abnormality within the neck. Upper chest: Visualized upper chest demonstrates no acute finding. Review of the MIP images confirms the above findings CTA HEAD FINDINGS Anterior circulation: Mild atheromatous change within the carotid siphons without stenosis. A1 segments, anterior communicating complex common anterior cerebral arteries widely patent. No M1 stenosis or occlusion. No proximal MCA branch occlusion or stenosis. Distal MCA branches perfused and symmetric. Posterior circulation: Both V4 segments patent without stenosis. Both PICA patent. 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 Basilar diminutive but widely patent. Superior cerebral arteries patent bilaterally. Predominant fetal type origin of the PCAs. Both PCAs widely patent to their distal aspects without stenosis. Venous sinuses: Grossly patent allowing for timing the contrast bolus. Anatomic variants: As above.  No aneurysm. Review of the MIP images confirms the above findings IMPRESSION: 1. Negative CTA for large vessel occlusion or other emergent finding. 2. Mild atheromatous  disease for age without hemodynamically significant or correctable stenosis. Aortic Atherosclerosis (ICD10-I70.0). Electronically Signed   By: Rise Mu M.D.   On: 03/25/2023 22:09   MR BRAIN WO CONTRAST  Result Date: 03/25/2023 CLINICAL DATA:  Initial evaluation for word-finding difficulty, recent stroke. EXAM: MRI HEAD WITHOUT CONTRAST TECHNIQUE: Multiplanar, multiecho pulse sequences of the brain and surrounding structures were obtained without intravenous contrast. COMPARISON:  CT from earlier same day as well as prior MRI from 03/05/2023. FINDINGS: Brain: Age-appropriate cerebral volume loss. Patchy T2/FLAIR hyperintensity involving the periventricular deep white matter both  cerebral hemispheres, consistent with moderate chronic microvascular ischemic disease. Few scatter remote lacunar infarcts present about the hemispheric cerebral white matter and deep gray nuclei. Few scattered small remote bilateral cerebellar infarcts noted. Patchy subcentimeter acute ischemic nonhemorrhagic infarct present at the anterior right frontal corona radiata/basal ganglia (series 9, image 32). Additional subcentimeter acute ischemic nonhemorrhagic subcortical infarct noted at the contralateral left frontal lobe (series 9, image 39). Normal expected interval evolution of previously identified subcentimeter infarct at the right temporal occipital junction (series 9, image 23). No associated hemorrhage or mass effect. No other evidence for acute or subacute ischemia. No acute or chronic intracranial blood products. No mass lesion, mass effect or midline shift. No hydrocephalus or extra-axial fluid collection. Pituitary gland and suprasellar region within normal limits. Vascular: Major intracranial vascular flow voids are maintained. Skull and upper cervical spine: Craniocervical junction within normal limits. Bone marrow signal intensity normal. No scalp soft tissue abnormality. Sinuses/Orbits: Prior bilateral ocular lens replacement. Paranasal sinuses are largely clear. No significant mastoid effusion. Other: None. IMPRESSION: 1. 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 2. Normal expected interval evolution of previously identified subcentimeter infarct at the right temporoccipital junction. No associated hemorrhage or mass effect. 3. Underlying age-appropriate cerebral volume loss with moderate chronic microvascular ischemic disease, with a few scattered remote lacunar infarcts involving the hemispheric cerebral white matter, deep gray nuclei, and cerebellum. Electronically Signed    By: Rise Mu M.D.   On: 03/25/2023 18:19   CT HEAD WO CONTRAST  Result Date: 03/25/2023 CLINICAL DATA:  Fall EXAM: CT HEAD WITHOUT CONTRAST CT CERVICAL SPINE WITHOUT CONTRAST TECHNIQUE: Multidetector CT imaging of the head and cervical spine was performed following the standard protocol without intravenous contrast. Multiplanar CT image reconstructions of the cervical spine were also generated. RADIATION DOSE REDUCTION: This exam was performed according to the departmental dose-optimization program which includes automated exposure control, adjustment of the mA and/or kV according to patient size and/or use of iterative reconstruction technique. COMPARISON:  None Available. FINDINGS: CT HEAD FINDINGS Brain: No evidence of acute infarction, hemorrhage, hydrocephalus, extra-axial collection or mass lesion/mass effect. There is sequela of moderate severe chronic microvascular ischemic change. Vascular: No hyperdense vessel or unexpected calcification. Skull: Normal. Negative for fracture or focal lesion. Sinuses/Orbits: No middle ear or mastoid effusion. Paranasal sinuses clear. Bilateral lens replacement. Orbits are otherwise unremarkable Other: None. CT CERVICAL SPINE FINDINGS Alignment: There is straightening of the normal cervical lordosis. Skull base and vertebrae: No acute fracture. No primary bone lesion or focal pathologic process. Soft tissues and spinal canal: No prevertebral fluid or swelling. No visible canal hematoma. 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 Other: None IMPRESSION: 1. No CT evidence of intracranial injury. Sequela of moderate to severe chronic microvascular ischemic change. 2. No acute fracture or traumatic malalignment of the cervical  spine. 3. 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 Electronically Signed   By: Lorenza Cambridge M.D.   On: 03/25/2023 13:04   CT Cervical Spine Wo Contrast  Result Date: 03/25/2023 CLINICAL  DATA:  Fall EXAM: CT HEAD WITHOUT CONTRAST CT CERVICAL SPINE WITHOUT CONTRAST TECHNIQUE: Multidetector CT imaging of the head and cervical spine was performed following the standard protocol without intravenous contrast. Multiplanar CT image reconstructions of the cervical spine were also generated. RADIATION DOSE REDUCTION: This exam was performed according to the departmental dose-optimization program which includes automated exposure control, adjustment of the mA and/or kV according to patient size and/or use of iterative reconstruction technique. COMPARISON:  None Available. FINDINGS: CT HEAD FINDINGS Brain: No evidence of acute infarction, hemorrhage, hydrocephalus, extra-axial collection or mass lesion/mass effect. There is sequela of moderate severe chronic microvascular ischemic change. Vascular: No hyperdense vessel or unexpected calcification. Skull: Normal. Negative for fracture or focal lesion. Sinuses/Orbits: No middle ear or mastoid effusion. Paranasal sinuses clear. Bilateral lens replacement. Orbits are otherwise unremarkable Other: None. CT CERVICAL SPINE FINDINGS Alignment: There is straightening of the normal cervical lordosis. Skull base and vertebrae: No acute fracture. No primary bone lesion or focal pathologic process. Soft tissues and spinal canal: No prevertebral fluid or swelling. No visible canal hematoma. 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 Other: None IMPRESSION: 1. No CT evidence of intracranial injury. Sequela of moderate to severe chronic microvascular ischemic change. 2. No acute fracture or traumatic malalignment of the cervical spine. 3. 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 Electronically Signed   By: Lorenza Cambridge M.D.   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  Plan for TEE with CVA either Monday or Tuesday -  npo after MN  CVA acute Multifocal small infarcts,  embolic pattern, etiology unclear, could be due to aortic mobile atheroma versus cardioembolic source per neurology and IM.  Plavix added to ASA with plan for DAPT for 3 weeks then plavix alone. HTN on losartan and atenolol with CVA permissive HTN.  Monitoring per IM HLD on pravastatin now changed to Crestor ( no CAD on cath in 2023)   DM-2 per IM    Risk Assessment/Risk Scores:                For questions or updates, please contact [facility] Please consult [email or web address] for contact info under    Signed, [name], NP  03/28/2023 9:07 AM   I have seen and examined the patient along with [name], NP .  I have reviewed the chart, notes and new data.  I agree with PA/NP's note.  Key new complaints: He denies any difficulties with breathing or chest discomfort.  He has not experienced any palpitations.  Feels "lonely" and is eager to go home. Key examination changes: Lying fully supine in bed without any respiratory difficulty.  No jugular venous distention.  Regular rate and rhythm, 3/6 mid peaking systolic ejection murmur and a rather faint second heart sound, no edema, warm extremities and normal peripheral pulses.  Appears to be alert and oriented, but sometimes a little hesitant in his answers Key new findings / data: Echocardiogram is reviewed.  There has been a significant increase in the gradients across the valve prosthesis and a reduction in the dimensionless valve index, consistent with a bowel obstruction.  He has a peculiar situation with  a TAVR valve inserted inside a second TAVR valve that had significant paravalvular leak.   PLAN: Concern for prosthetic valve thrombosis as a possible source for embolic stroke. Although CT may be superior to TEE for TAVR thrombosis evaluation, TEE will provide a more comprehensive study, screening for other sources of cardioembolic stroke as well. If TEE unrevealing, recommend implantable loop recorder. His legs are very  weak and he has fallen a couple of times. Lives alone with his wife in a 2-storey home, shower is upstairs (14 steps). Will need intense PT, maybe even inpatient rehab.  Thurmon Fair, MD, [facility] [phone] 03/28/2023, 10:50 AM

## 2023-03-28 NOTE — Progress Notes (Signed)
Mobility Specialist - Progress Note   03/28/23 1143  Mobility  Activity Transferred from bed to chair  Level of Assistance Minimal assist, patient does 75% or more  Assistive Device Front wheel walker  Activity Response Tolerated well  Mobility Referral Yes  $Mobility charge 1 Mobility   Pt was received in bed and agreeable to transfer. Pt was MinA throughout session. Pt was left in chair with all needs met and chair alarm on. RN aware.   Alda Lea  Mobility Specialist Please contact via Special educational needs teacher or Rehab office at [phone]

## 2023-03-29 ENCOUNTER — Other Ambulatory Visit (HOSPITAL_COMMUNITY): Payer: Self-pay

## 2023-03-29 ENCOUNTER — Inpatient Hospital Stay (HOSPITAL_COMMUNITY): Payer: Medicare Other

## 2023-03-29 DIAGNOSIS — I639 Cerebral infarction, unspecified: Secondary | ICD-10-CM | POA: Diagnosis not present

## 2023-03-29 DIAGNOSIS — I358 Other nonrheumatic aortic valve disorders: Secondary | ICD-10-CM | POA: Diagnosis not present

## 2023-03-29 DIAGNOSIS — I350 Nonrheumatic aortic (valve) stenosis: Secondary | ICD-10-CM

## 2023-03-29 DIAGNOSIS — R410 Disorientation, unspecified: Secondary | ICD-10-CM

## 2023-03-29 DIAGNOSIS — I6389 Other cerebral infarction: Secondary | ICD-10-CM | POA: Diagnosis not present

## 2023-03-29 LAB — BASIC METABOLIC PANEL
Anion gap: 6 (ref 5–15)
BUN: 23 mg/dL (ref 8–23)
CO2: 27 mmol/L (ref 22–32)
Calcium: 8.6 mg/dL — ABNORMAL LOW (ref 8.9–10.3)
Chloride: 105 mmol/L (ref 98–111)
Creatinine, Ser: 1.61 mg/dL — ABNORMAL HIGH (ref 0.61–1.24)
GFR, Estimated: 42 mL/min — ABNORMAL LOW (ref >60)
Glucose, Bld: 107 mg/dL — ABNORMAL HIGH (ref 70–99)
Potassium: 3.6 mmol/L (ref 3.5–5.1)
Sodium: 138 mmol/L (ref 135–145)

## 2023-03-29 LAB — CBC WITH DIFFERENTIAL/PLATELET
Abs Immature Granulocytes: 0.05 10*3/uL (ref 0.00–0.07)
Basophils Absolute: 0.1 10*3/uL (ref 0.0–0.1)
Basophils Relative: 1 %
Eosinophils Absolute: 0.4 10*3/uL (ref 0.0–0.5)
Eosinophils Relative: 4 %
HCT: 45.7 % (ref 39.0–52.0)
Hemoglobin: 15 g/dL (ref 13.0–17.0)
Immature Granulocytes: 1 %
Lymphocytes Relative: 18 %
Lymphs Abs: 1.4 10*3/uL (ref 0.7–4.0)
MCH: 29.3 pg (ref 26.0–34.0)
MCHC: 32.8 g/dL (ref 30.0–36.0)
MCV: 89.3 fL (ref 80.0–100.0)
Monocytes Absolute: 0.9 10*3/uL (ref 0.1–1.0)
Monocytes Relative: 11 %
Neutro Abs: 5.3 10*3/uL (ref 1.7–7.7)
Neutrophils Relative %: 65 %
Platelets: 147 10*3/uL — ABNORMAL LOW (ref 150–400)
RBC: 5.12 MIL/uL (ref 4.22–5.81)
RDW: 14.6 % (ref 11.5–15.5)
WBC: 8.1 10*3/uL (ref 4.0–10.5)
nRBC: 0 % (ref 0.0–0.2)

## 2023-03-29 LAB — GLUCOSE, CAPILLARY
Glucose-Capillary: 104 mg/dL — ABNORMAL HIGH (ref 70–99)
Glucose-Capillary: 96 mg/dL (ref 70–99)
Glucose-Capillary: 167 mg/dL — ABNORMAL HIGH (ref 70–99)
Glucose-Capillary: 124 mg/dL — ABNORMAL HIGH (ref 70–99)

## 2023-03-29 LAB — MAGNESIUM: Magnesium: 2.2 mg/dL (ref 1.7–2.4)

## 2023-03-29 LAB — BRAIN NATRIURETIC PEPTIDE: B Natriuretic Peptide: 880.7 pg/mL — ABNORMAL HIGH (ref 0.0–100.0)

## 2023-03-29 MED ORDER — APIXABAN 2.5 MG PO TABS
2.5000 mg | ORAL_TABLET | Freq: Two times a day (BID) | ORAL | Status: DC
  Administered 2023-03-29 – 2023-03-31 (×5): 2.5 mg via ORAL
  Filled 2023-03-29 (×5): qty 1

## 2023-03-29 MED ORDER — IOHEXOL 350 MG/ML SOLN
80.0000 mL | Freq: Once | INTRAVENOUS | Status: AC | PRN
  Administered 2023-03-29: 80 mL via INTRAVENOUS

## 2023-03-29 MED ORDER — POTASSIUM CHLORIDE CRYS ER 20 MEQ PO TBCR
20.0000 meq | EXTENDED_RELEASE_TABLET | Freq: Once | ORAL | Status: AC
  Administered 2023-03-29: 20 meq via ORAL
  Filled 2023-03-29: qty 1

## 2023-03-29 MED ORDER — AMLODIPINE BESYLATE 10 MG PO TABS
10.0000 mg | ORAL_TABLET | Freq: Every day | ORAL | Status: DC
  Administered 2023-03-29 – 2023-03-31 (×3): 10 mg via ORAL
  Filled 2023-03-29 (×3): qty 1

## 2023-03-29 NOTE — TOC Benefit Eligibility Note (Signed)
Patient Advocate Encounter  Insurance verification completed.    The patient is currently admitted and upon discharge could be taking Eliquis 5 mg.  The current 30 day co-pay is $45.00.   The patient is insured through [employer] Part D   This test claim was processed through [facility]- copay amounts may vary at other pharmacies due to pharmacy/plan contracts, or as the patient moves through the different stages of their insurance plan.  Brecklyn Galvis, CPHT Pharmacy Patient Advocate Specialist [facility] Patient Advocate Team Direct Number: [phone]  Fax: [phone]       

## 2023-03-29 NOTE — Progress Notes (Signed)
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  Allergies  Allergen Reactions   Farxiga [Dapagliflozin] Other (See Comments)    Gout    Metformin And Related Other (See Comments)    "Acidosis"   Atorvastatin Other (See Comments)    Headaches    Benazepril Hcl Other (See Comments)    Was not effective   Flexeril [Cyclobenzaprine] Other (See Comments)    Hallucinations    Oxycodone Other (See Comments)    Hallucinations     Patient Measurements: Height: 5\' 11"  (180.3 cm) Weight: 102.1 kg (225 lb) IBW/kg (Calculated) : 75.3   Vital Signs: Temp: 98 F (36.7 C) (04/29 1223) Temp Source: Oral (04/29 1223) BP: 150/98 (04/29 1223) Pulse Rate: 75 (04/29 1223)  Labs: Recent Labs    03/28/23 0618 03/29/23 0241  HGB 15.6 15.0  HCT 49.2 45.7  PLT 148* 147*  CREATININE 1.42* 1.61*    Estimated Creatinine Clearance: 41.5 mL/min (A) (by C-G formula based on SCr of 1.61 mg/dL (H)).   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 Excell Seltzer and Dr. Laneta Simmers   Severe aortic stenosis     Assessment: 82yom admitted with new CVA on asa prior to admit s/p TAVR.  Initially added clopidogrel for new CVA but s/p cardiac CT sith thrombus on lTAVR leaflet - will begin anticoagulation with apixaban 2.5mg  BID Age > 80y, Cr > 1.5, wt . 60kg  Cbc stable   Goal of Therapy:   Monitor platelets by anticoagulation protocol: Yes   Plan:  Apixaban 2.5mg  BID Monitor s/s bleeding    Leota Sauers Pharm.D. CPP, BCPS Clinical Pharmacist [phone] 03/29/2023 1:08 PM

## 2023-03-29 NOTE — Progress Notes (Addendum)
Rounding Note    Patient Name: Ryan Duke Date of Encounter: 03/29/2023  [facility] Cardiologist: Tonny Bollman, MD   Subjective   No acute events overnight.  Feels well.  Just got back from CTA.  Inpatient Medications    Scheduled Meds:  aspirin  81 mg Oral Daily   atenolol  50 mg Oral Daily   clopidogrel  75 mg Oral Daily   vitamin B-12  1,000 mcg Oral Daily   enoxaparin (LOVENOX) injection  40 mg Subcutaneous Q24H   insulin aspart  0-9 Units Subcutaneous Q6H   insulin glargine-yfgn  15 Units Subcutaneous BID   losartan  100 mg Oral Daily   rosuvastatin  20 mg Oral Daily   tamsulosin  0.4 mg Oral Daily   Continuous Infusions:  PRN Meds: acetaminophen **OR** acetaminophen (TYLENOL) oral liquid 160 mg/5 mL **OR** acetaminophen, hydrALAZINE, senna-docusate   Vital Signs    Vitals:   03/28/23 1626 03/28/23 2037 03/28/23 2327 03/29/23 0604  BP: (!) 167/67 (!) 169/58 (!) 178/76 (!) 155/79  Pulse: 62 60 60 60  Resp: (!) 28 20 20 20   Temp: 98.3 F (36.8 C) 97.8 F (36.6 C) 98.1 F (36.7 C) 98.3 F (36.8 C)  TempSrc: Oral Axillary Oral Oral  SpO2: 93% 93% 93% 95%  Weight:      Height:        Intake/Output Summary (Last 24 hours) at 03/29/2023 1053 Last data filed at 03/29/2023 1610 Gross per 24 hour  Intake --  Output 850 ml  Net -850 ml      03/25/2023   10:52 AM 03/04/2023    1:40 PM 01/19/2023   10:58 AM  Last 3 Weights  Weight (lbs) 225 lb 226 lb 231 lb 12.8 oz  Weight (kg) 102.059 kg 102.513 kg 105.144 kg      Telemetry    SR - Personally Reviewed  ECG    EKG 4/25 SR - Personally Reviewed  Physical Exam   GEN: No acute distress.   Neck: No JVD Cardiac: RRR, with 3/6 SEM Respiratory: Clear to auscultation bilaterally. GI: Soft, nontender, non-distended  MS: No edema; No deformity. Neuro:  Nonfocal  Psych: Normal affect   Labs    High Sensitivity Troponin:  No results for input(s): "TROPONINIHS" in the last 720 hours.    Chemistry Recent Labs  Lab 03/25/23 1145 03/26/23 0500 03/28/23 0618 03/29/23 0241  NA 138 138 138 138  K 3.7 3.6 3.7 3.6  CL 103 103 104 105  CO2 25 26 25 27   GLUCOSE 119* 135* 126* 107*  BUN 22 20 19 23   CREATININE 1.34* 1.37* 1.42* 1.61*  CALCIUM 9.0 8.9 8.7* 8.6*  MG  --   --  2.1 2.2  PROT 6.8  --   --   --   ALBUMIN 3.9  --   --   --   AST 20  --   --   --   ALT 13  --   --   --   ALKPHOS 78  --   --   --   BILITOT 1.0  --   --   --   GFRNONAA 52* 51* 49* 42*  ANIONGAP 10 9 9 6     Lipids  Recent Labs  Lab 03/25/23 1145  CHOL 156  TRIG 75  HDL 45  LDLCALC 96  CHOLHDL 3.5    Hematology Recent Labs  Lab 03/26/23 0500 03/28/23 0618 03/29/23 0241  WBC 8.4 8.3  8.1  RBC 5.66 5.41 5.12  HGB 16.6 15.6 15.0  HCT 51.9 49.2 45.7  MCV 91.7 90.9 89.3  MCH 29.3 28.8 29.3  MCHC 32.0 31.7 32.8  RDW 14.7 14.7 14.6  PLT 142* 148* 147*   Thyroid  Recent Labs  Lab 03/27/23 0945  TSH 4.606*    BNP Recent Labs  Lab 03/28/23 0618 03/29/23 0241  BNP 752.5* 880.7*    DDimer No results for input(s): "DDIMER" in the last 168 hours.   Radiology     MRI 03/25/23 1. 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 2. Normal expected interval evolution of previously identified subcentimeter infarct at the right temporoccipital junction. No associated hemorrhage or mass effect. 3. Underlying age-appropriate cerebral volume loss with moderate chronic microvascular ischemic disease, with a few scattered remote lacunar infarcts involving the hemispheric cerebral white matter, deep gray nuclei, and cerebellum.  DG Chest Port 1 View  Result Date: 03/28/2023 CLINICAL DATA:  Shortness of breath EXAM: PORTABLE CHEST 1 VIEW COMPARISON:  10/19/2022 FINDINGS: Normal heart size. Transcatheter aortic valve replacement. Chronic blunting at the lateral left  costophrenic sulcus which is from fat by most recent CT. There is no edema, consolidation, effusion, or pneumothorax. IMPRESSION: No acute finding when compared to prior. Electronically Signed   By: Tiburcio Pea M.D.   On: 03/28/2023 06:19   ECHOCARDIOGRAM COMPLETE  Result Date: 03/27/2023    ECHOCARDIOGRAM REPORT   Patient Name:   Ryan Duke Date of Exam: 03/26/2023 Medical Rec #:  [id number]       Height:       71.0 in Accession #:    [id number]      Weight:       225.0 lb Date of Birth:  1941/06/05       BSA:          2.217 m Patient Age:    82 years        BP:           180/78 mmHg Patient Gender: M               HR:           84 bpm. Exam Location:  Inpatient Procedure: 2D Echo, Cardiac Doppler and Color Doppler Indications:    stroke  History:        Patient has prior history of Echocardiogram examinations. Risk                 Factors:Diabetes, Dyslipidemia and Hypertension.  Sonographer:    Mike Gip Referring Phys: [id number] [facility] IMPRESSIONS  1. Left ventricular ejection fraction, by estimation, is 70 to 75%. The left ventricle has hyperdynamic function. Left ventricular endocardial border not optimally defined to evaluate regional wall motion. There is severe left ventricular hypertrophy. Left ventricular diastolic parameters are indeterminate.  2. Right ventricular systolic function is normal. The right ventricular size is normal. Tricuspid regurgitation signal is inadequate for assessing PA pressure.  3. The mitral valve is normal in structure. No evidence of mitral valve regurgitation. No evidence of mitral stenosis.  4. Edwards Sapien 3 Ultra Resilia THV (size 26 mm). Operative note 07/14/2022 indicates after initial valve deployed significant paravalvular leak, a second Edwards Sapien 3 ultra resilia THV 26 mm valve was then also deployed which significantly reduced  paravalvular leak. The gradient across the prosthetic valves has increased since 08/19/22 study(prior mean  gradient , up  to 30 mmHg on this study). Consider TEE for further structural evaluation of prosthetic valve. . The aortic valve was not well visualized. Aortic valve regurgitation Trivial paravalvular leak.  5. The inferior vena cava is normal in size with greater than 50% respiratory variability, suggesting right atrial pressure of 3 mmHg. FINDINGS  Left Ventricle: Left ventricular ejection fraction, by estimation, is 70 to 75%. The left ventricle has hyperdynamic function. Left ventricular endocardial border not optimally defined to evaluate regional wall motion. The left ventricular internal cavity size was normal in size. There is severe left ventricular hypertrophy. Left ventricular diastolic parameters are indeterminate. Right Ventricle: The right ventricular size is normal. Right vetricular wall thickness was not well visualized. Right ventricular systolic function is normal. Tricuspid regurgitation signal is inadequate for assessing PA pressure. Left Atrium: Left atrial size was normal in size. Right Atrium: Right atrial size was normal in size. Pericardium: There is no evidence of pericardial effusion. Mitral Valve: The mitral valve is normal in structure. No evidence of mitral valve regurgitation. No evidence of mitral valve stenosis. Tricuspid Valve: The tricuspid valve is normal in structure. Tricuspid valve regurgitation is not demonstrated. No evidence of tricuspid stenosis. Aortic Valve: Edwards Sapien 3 Ultra Resilia THV (size 26 mm). Operative note 07/14/2022 indicates after initial valve deployed significant paravalvular leak, a second Edwards Sapien 3 ultra resilia THV 26 mm valve was then also deployed which significantly reduced paravalvular leak. The gradient across the prosthetic valves has increased since 08/19/22 study(prior mean gradient , up to 30 mmHg on this study). Consider TEE for further structural evaluation of prosthetic valve. The aortic valve was not well visualized. Aortic  valve regurgitation Trivial paravalvular leak. Aortic valve mean gradient measures 30.0 mmHg. Aortic valve peak gradient measures 55.1 mmHg. Aortic valve area, by VTI measures 1.55 cm. Pulmonic Valve: The pulmonic valve was not well visualized. Pulmonic valve regurgitation is not visualized. No evidence of pulmonic stenosis. Aorta: The aortic root is normal in size and structure. Venous: The inferior vena cava is normal in size with greater than 50% respiratory variability, suggesting right atrial pressure of 3 mmHg. IAS/Shunts: No atrial level shunt detected by color flow Doppler.  LEFT VENTRICLE PLAX 2D LVIDd:         3.90 cm      Diastology LVIDs:         2.20 cm      LV e' medial:    4.03 cm/s LV PW:         2.10 cm      LV E/e' medial:  10.0 LV IVS:        2.20 cm      LV e' lateral:   4.57 cm/s LVOT diam:     2.10 cm      LV E/e' lateral: 8.9 LV SV:         115 LV SV Index:   52 LVOT Area:     3.46 cm  LV Volumes (MOD) LV vol d, MOD A2C: 101.0 ml LV vol d, MOD A4C: 104.0 ml LV vol s, MOD A2C: 31.0 ml LV vol s, MOD A4C: 26.3 ml LV SV MOD A2C:     70.0 ml LV SV MOD A4C:     104.0 ml LV SV MOD BP:      76.6 ml RIGHT VENTRICLE             IVC RV Basal diam:  4.40 cm     IVC diam: 1.90 cm RV  S prime:     13.90 cm/s TAPSE (M-mode): 2.4 cm LEFT ATRIUM             Index        RIGHT ATRIUM           Index LA diam:        3.60 cm 1.62 cm/m   RA Area:     25.40 cm LA Vol (A2C):   83.1 ml 37.49 ml/m  RA Volume:   75.80 ml  34.19 ml/m LA Vol (A4C):   42.4 ml 19.13 ml/m LA Biplane Vol: 59.4 ml 26.80 ml/m  AORTIC VALVE AV Area (Vmax):    1.19 cm AV Area (Vmean):   1.32 cm AV Area (VTI):     1.55 cm AV Vmax:           371.00 cm/s AV Vmean:          256.000 cm/s AV VTI:            0.738 m AV Peak Grad:      55.1 mmHg AV Mean Grad:      30.0 mmHg LVOT Vmax:         128.00 cm/s LVOT Vmean:        97.800 cm/s LVOT VTI:          0.331 m LVOT/AV VTI ratio: 0.45  AORTA Ao Root diam: 3.90 cm Ao Asc diam:  3.10 cm MITRAL  VALVE MV Area (PHT): 2.33 cm     SHUNTS MV Decel Time: 326 msec     Systemic VTI:  0.33 m MV E velocity: 40.50 cm/s   Systemic Diam: 2.10 cm MV A velocity: 104.00 cm/s MV E/A ratio:  0.39 Dina Rich MD Electronically signed by Dina Rich MD Signature Date/Time: Apr 05, 2023/1:59:42 PM    Final     Cardiac Studies   CTA 4/28 pending   TTE 2023/04/05  1. Left ventricular ejection fraction, by estimation, is 70 to 75%. The  left ventricle has hyperdynamic function. Left ventricular endocardial  border not optimally defined to evaluate regional wall motion. There is  severe left ventricular hypertrophy.  Left ventricular diastolic parameters are indeterminate.   2. Right ventricular systolic function is normal. The right ventricular  size is normal. Tricuspid regurgitation signal is inadequate for assessing  PA pressure.   3. The mitral valve is normal in structure. No evidence of mitral valve  regurgitation. No evidence of mitral stenosis.   4. Edwards Sapien 3 Ultra Resilia THV (size 26 mm). Operative note  07/14/2022 indicates after initial valve deployed significant paravalvular  leak, a second Edwards Sapien 3 ultra resilia THV 26 mm valve was then  also deployed which significantly reduced   paravalvular leak. The gradient across the prosthetic valves has  increased since 08/19/22 study(prior mean gradient , up to 30 mmHg on  this study). Consider TEE for further structural evaluation of prosthetic  valve. . The aortic valve was not well  visualized. Aortic valve regurgitation Trivial paravalvular leak.   5. The inferior vena cava is normal in size with greater than 50%  respiratory variability, suggesting right atrial pressure of 3 mmHg.   Patient Profile     82 y.o. 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    Assessment &  Plan    CVA:  Per neuro, cont DAPT for now.  I suspect with increased TAVR gradient and murmur, the patient has developed leaflet thrombosis; if confirmed on CT today, will change to Eliquis.  If not, will obtain TEE tomorrow. 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  CTA pending. HTN:  BP elevated, allow permissive HTN HL:  Continue crestor T2DM:  Cont ASA, statin, losartan, consider SGLT2i.     ADDENDUM:  CTA c/w leaflet thrombosis, will d/c DAPT and start Eliquis.  For questions or updates, please contact [facility] Please consult [email or web address] for contact info under        Signed, Orbie Pyo, MD  03/29/2023, 10:53 AM

## 2023-03-29 NOTE — Progress Notes (Addendum)
PROGRESS NOTE                                                                                                                                                                                                             Patient Demographics:    Ryan Duke, is a 82 y.o. male, DOB - 1940/12/09, ZOX:[id number]  Outpatient Primary MD for the patient is [name], MD    LOS - 2  Admit date - 03/25/2023    Chief Complaint  Patient presents with   Altered Mental Status       Brief Narrative (HPI from H&P)   82 years old Male with PMH significant for insulin-dependent type 2 diabetes, CKD stage IIIa, severe aortic stenosis s/p TAVR, hypertension, hyperlipidemia, BPH presented to the ED for the evaluation of recurrent falls.  Patient reports recurrent fall for last 1 month,  states he lost his balance when walking resulting in falls.  Patient did have an outpatient MRI which showed 4 mm acute subcortical nonhemorrhagic white matter infarct in the posterior right temporal lobe.  In the ER workup again consistent with acute hemic stroke, he was seen by neurology team, stroke workup being done subsequently he has developed severe encephalopathy as well.  He now most likely will require SNF placement.   Subjective:   Patient in bed, appears comfortable, denies any headache, no fever, no chest pain or pressure, no shortness of breath , no abdominal pain. No new focal weakness, is mildly confused today.   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 He has been seen by neurology team and has undergone full stroke workup, TTE noted, currently on dual antiplatelet therapy, LDL above goal hence switched from Lipitor to Crestor, A1c satisfactory, seen by stroke team for now loop recorder and TEE vs CT chest per  cardiology to evaluate for TAVR site thrombosis, neurology and neurology following.  Plan per stroke team is dual antiplatelet therapy for 25 days thereafter Plavix alone.  Case also discussed with cardiology if TAVR site thrombosis is confirmed he will require Eliquis.   Acute metabolic encephalopathy.  Waxing and waning, likely in the setting of acute stroke, underlying age-related cognitive dysfunction, MRI evidence of some underlying vascular dementia and being  in unfamiliar setting, stable TSH, RPR and HIV status, B12 low normal and placed on oral supplementation.  Dyslipidemia.  Switch to Crestor for better control.  Hypertension.  For now permissive hypertension due to #1.  Will start titrating with medications after about 4 to 5 days out of acute event.  CKD 3A.  Creatinine appears to be at baseline of 1.6.  Severe aortic stenosis s/p TAVR.  Supportive care for now, TTE noted, TEE-Loop recorder requested along with cardiology consulted due to severe leak around the prosthetic TAVR valve.  Multiple falls at home.  Likely due to combination of #1 and 2 above, CT head, CTA head and neck no acute fractures or bleed.  ET, OT and SNF.  Low NML B12 - PO B12  DM type II.  On high doses of Lantus and sliding scale, dose adjusted on 03/27/2023 for better control, poor oral intake hence will monitor cautiously.  Lab Results  Component Value Date   HGBA1C 6.7 (H) 03/25/2023   CBG (last 3)  Recent Labs    03/28/23 1627 03/28/23 2327 03/29/23 0611  GLUCAP 105* 138* 104*         Condition - Extremely Guarded  Family Communication  :  called wife [phone] 03/27/2023 at 7:44 AM and message left, updated on 03/28/23  Code Status :  DNR  Consults  :  Neuro, Cards  PUD Prophylaxis :    Procedures  :     TEE -  MRI - 1. Patchy subcentimeter acute ischemic nonhemorrhagic infarcts involving the anterior right corona radiata/basal ganglia and contralateral subcortical left frontal  lobe. 2. Normal expected interval evolution of previously identified subcentimeter infarct at the right temporoccipital junction. No associated hemorrhage or mass effect. 3. Underlying age-appropriate cerebral volume loss with moderate chronic microvascular ischemic disease, with a few scattered remote lacunar infarcts involving the hemispheric cerebral white matter, deep gray nuclei, and cerebellum.  TTE - 1. Left ventricular ejection fraction, by estimation, is 70 to 75%. The left ventricle has hyperdynamic function. Left ventricular endocardial border not optimally defined to evaluate regional wall motion. There is severe left ventricular hypertrophy. Left ventricular diastolic parameters are indeterminate.  2. Right ventricular systolic function is normal. The right ventricular size is normal. Tricuspid regurgitation signal is inadequate for assessing PA pressure.  3. The mitral valve is normal in structure. No evidence of mitral valve regurgitation. No evidence of mitral stenosis.  4. Edwards Sapien 3 Ultra Resilia THV (size 26 mm). Operative note 07/14/2022 indicates after initial valve deployed significant paravalvular leak, a second Edwards Sapien 3 ultra resilia THV 26 mm valve was then also deployed which significantly reduced  paravalvular leak. The gradient across the prosthetic valves has increased since 08/19/22 study(prior mean gradient , up to 30 mmHg on this study). Consider TEE for further structural evaluation of prosthetic valve. . The aortic valve was not well visualized. Aortic valve regurgitation Trivial paravalvular leak.  5. The inferior vena cava is normal in size with greater than 50% respiratory variability, suggesting right atrial pressure of 3 mmHg  EEG - nonspecific encephalopathy.    CT Head and C Spine - 1. No CT evidence of intracranial injury. Sequela of moderate to severe chronic microvascular ischemic change. 2. No acute fracture or traumatic malalignment of the cervical  spine. 3. 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  CTA - 1. Negative CTA for large vessel occlusion or other emergent finding. 2. Mild atheromatous disease for age without hemodynamically significant or correctable  stenosis. Aortic Atherosclerosis       Disposition Plan  :    Status is: Observation  DVT Prophylaxis  :    enoxaparin (LOVENOX) injection 40 mg Start: 03/25/23 2200   Lab Results  Component Value Date   PLT 147 (L) 03/29/2023    Diet :  Diet Order             DIET SOFT Room service appropriate? No; Fluid consistency: Thin  Diet effective now                    Inpatient Medications  Scheduled Meds:  aspirin  81 mg Oral Daily   atenolol  50 mg Oral Daily   clopidogrel  75 mg Oral Daily   vitamin B-12  1,000 mcg Oral Daily   enoxaparin (LOVENOX) injection  40 mg Subcutaneous Q24H   insulin aspart  0-9 Units Subcutaneous Q6H   insulin glargine-yfgn  15 Units Subcutaneous BID   losartan  100 mg Oral Daily   rosuvastatin  20 mg Oral Daily   tamsulosin  0.4 mg Oral Daily   Continuous Infusions: PRN Meds:.acetaminophen **OR** acetaminophen (TYLENOL) oral liquid 160 mg/5 mL **OR** acetaminophen, hydrALAZINE, senna-docusate  Antibiotics  :    Anti-infectives (From admission, onward)    None         Objective:   Vitals:   03/28/23 1626 03/28/23 2037 03/28/23 2327 03/29/23 0604  BP: (!) 167/67 (!) 169/58 (!) 178/76 (!) 155/79  Pulse: 62 60 60 60  Resp: (!) 28 20 20 20   Temp: 98.3 F (36.8 C) 97.8 F (36.6 C) 98.1 F (36.7 C) 98.3 F (36.8 C)  TempSrc: Oral Axillary Oral Oral  SpO2: 93% 93% 93% 95%  Weight:      Height:        Wt Readings from Last 3 Encounters:  03/25/23 102.1 kg  03/04/23 102.5 kg  01/19/23 105.1 kg     Intake/Output Summary (Last 24 hours) at 03/29/2023 0718 Last data filed at 03/29/2023 1610 Gross per 24 hour  Intake --  Output 850 ml  Net -850 ml     Physical Exam  Awake but mildly confused, No  new F.N deficits, Normal affect [place].AT,PERRAL Supple Neck, No JVD,   Symmetrical Chest wall movement, Good air movement bilaterally, CTAB RRR,No Gallops,Rubs or new Murmurs,  +ve B.Sounds, Abd Soft, No tenderness,   No Cyanosis, Clubbing or edema     RN pressure injury documentation: Pressure Injury 03/25/23 Buttocks Left;Right Stage 2 -  Partial thickness loss of dermis presenting as a shallow open injury with a red, pink wound bed without slough. (Active)  03/25/23 2300  Location: Buttocks  Location Orientation: Left;Right  Staging: Stage 2 -  Partial thickness loss of dermis presenting as a shallow open injury with a red, pink wound bed without slough.  Wound Description (Comments):   Present on Admission:   Dressing Type Foam - Lift dressing to assess site every shift 03/28/23 1944      Data Review:    Recent Labs  Lab 03/25/23 1145 03/26/23 0500 03/28/23 0618 03/29/23 0241  WBC 8.1 8.4 8.3 8.1  HGB 16.5 16.6 15.6 15.0  HCT 53.0* 51.9 49.2 45.7  PLT 130* 142* 148* 147*  MCV 91.5 91.7 90.9 89.3  MCH 28.5 29.3 28.8 29.3  MCHC 31.1 32.0 31.7 32.8  RDW 14.7 14.7 14.7 14.6  LYMPHSABS 1.4  --   --  1.4  MONOABS 0.6  --   --  0.9  EOSABS 0.2  --   --  0.4  BASOSABS 0.1  --   --  0.1    Recent Labs  Lab 03/25/23 1145 03/25/23 1146 03/26/23 0500 03/27/23 0945 03/28/23 0618 03/29/23 0241  NA 138  --  138  --  138 138  K 3.7  --  3.6  --  3.7 3.6  CL 103  --  103  --  104 105  CO2 25  --  26  --  25 27  ANIONGAP 10  --  9  --  9 6  GLUCOSE 119*  --  135*  --  126* 107*  BUN 22  --  20  --  19 23  CREATININE 1.34*  --  1.37*  --  1.42* 1.61*  AST 20  --   --   --   --   --   ALT 13  --   --   --   --   --   ALKPHOS 78  --   --   --   --   --   BILITOT 1.0  --   --   --   --   --   ALBUMIN 3.9  --   --   --   --   --   TSH  --   --   --  4.606*  --   --   HGBA1C 6.7*  --   --   --   --   --   AMMONIA  --  12  --   --   --   --   BNP  --   --   --   --  752.5*  880.7*  MG  --   --   --   --  2.1 2.2  CALCIUM 9.0  --  8.9  --  8.7* 8.6*   Lab Results  Component Value Date   CHOL 156 03/25/2023   HDL 45 03/25/2023   LDLCALC 96 03/25/2023   LDLDIRECT 81.0 05/26/2018   TRIG 75 03/25/2023   CHOLHDL 3.5 03/25/2023    Lab Results  Component Value Date   HGBA1C 6.7 (H) 03/25/2023     Radiology Reports DG Chest Port 1 View  Result Date: 03/28/2023 CLINICAL DATA:  Shortness of breath EXAM: PORTABLE CHEST 1 VIEW COMPARISON:  10/19/2022 FINDINGS: Normal heart size. Transcatheter aortic valve replacement. Chronic blunting at the lateral left costophrenic sulcus which is from fat by most recent CT. There is no edema, consolidation, effusion, or pneumothorax. IMPRESSION: No acute finding when compared to prior. Electronically Signed   By: Tiburcio Pea M.D.   On: 03/28/2023 06:19   ECHOCARDIOGRAM COMPLETE  Result Date: 03/27/2023    ECHOCARDIOGRAM REPORT   Patient Name:   OSVALDO LAMPING Deignan Date of Exam: 03/26/2023 Medical Rec #:  [id number]       Height:       71.0 in Accession #:    [id number]      Weight:       225.0 lb Date of Birth:  1941/05/22       BSA:          2.217 m Patient Age:    82 years        BP:           180/78 mmHg Patient Gender: M               HR:  84 bpm. Exam Location:  Inpatient Procedure: 2D Echo, Cardiac Doppler and Color Doppler Indications:    stroke  History:        Patient has prior history of Echocardiogram examinations. Risk                 Factors:Diabetes, Dyslipidemia and Hypertension.  Sonographer:    Mike Gip Referring Phys: [id number] [facility] IMPRESSIONS  1. Left ventricular ejection fraction, by estimation, is 70 to 75%. The left ventricle has hyperdynamic function. Left ventricular endocardial border not optimally defined to evaluate regional wall motion. There is severe left ventricular hypertrophy. Left ventricular diastolic parameters are indeterminate.  2. Right ventricular systolic function is  normal. The right ventricular size is normal. Tricuspid regurgitation signal is inadequate for assessing PA pressure.  3. The mitral valve is normal in structure. No evidence of mitral valve regurgitation. No evidence of mitral stenosis.  4. Edwards Sapien 3 Ultra Resilia THV (size 26 mm). Operative note 07/14/2022 indicates after initial valve deployed significant paravalvular leak, a second Edwards Sapien 3 ultra resilia THV 26 mm valve was then also deployed which significantly reduced  paravalvular leak. The gradient across the prosthetic valves has increased since 08/19/22 study(prior mean gradient , up to 30 mmHg on this study). Consider TEE for further structural evaluation of prosthetic valve. . The aortic valve was not well visualized. Aortic valve regurgitation Trivial paravalvular leak.  5. The inferior vena cava is normal in size with greater than 50% respiratory variability, suggesting right atrial pressure of 3 mmHg. FINDINGS  Left Ventricle: Left ventricular ejection fraction, by estimation, is 70 to 75%. The left ventricle has hyperdynamic function. Left ventricular endocardial border not optimally defined to evaluate regional wall motion. The left ventricular internal cavity size was normal in size. There is severe left ventricular hypertrophy. Left ventricular diastolic parameters are indeterminate. Right Ventricle: The right ventricular size is normal. Right vetricular wall thickness was not well visualized. Right ventricular systolic function is normal. Tricuspid regurgitation signal is inadequate for assessing PA pressure. Left Atrium: Left atrial size was normal in size. Right Atrium: Right atrial size was normal in size. Pericardium: There is no evidence of pericardial effusion. Mitral Valve: The mitral valve is normal in structure. No evidence of mitral valve regurgitation. No evidence of mitral valve stenosis. Tricuspid Valve: The tricuspid valve is normal in structure. Tricuspid valve  regurgitation is not demonstrated. No evidence of tricuspid stenosis. Aortic Valve: Edwards Sapien 3 Ultra Resilia THV (size 26 mm). Operative note 07/14/2022 indicates after initial valve deployed significant paravalvular leak, a second Edwards Sapien 3 ultra resilia THV 26 mm valve was then also deployed which significantly reduced paravalvular leak. The gradient across the prosthetic valves has increased since 08/19/22 study(prior mean gradient , up to 30 mmHg on this study). Consider TEE for further structural evaluation of prosthetic valve. The aortic valve was not well visualized. Aortic valve regurgitation Trivial paravalvular leak. Aortic valve mean gradient measures 30.0 mmHg. Aortic valve peak gradient measures 55.1 mmHg. Aortic valve area, by VTI measures 1.55 cm. Pulmonic Valve: The pulmonic valve was not well visualized. Pulmonic valve regurgitation is not visualized. No evidence of pulmonic stenosis. Aorta: The aortic root is normal in size and structure. Venous: The inferior vena cava is normal in size with greater than 50% respiratory variability, suggesting right atrial pressure of 3 mmHg. IAS/Shunts: No atrial level shunt detected by color flow Doppler.  LEFT VENTRICLE PLAX 2D LVIDd:  3.90 cm      Diastology LVIDs:         2.20 cm      LV e' medial:    4.03 cm/s LV PW:         2.10 cm      LV E/e' medial:  10.0 LV IVS:        2.20 cm      LV e' lateral:   4.57 cm/s LVOT diam:     2.10 cm      LV E/e' lateral: 8.9 LV SV:         115 LV SV Index:   52 LVOT Area:     3.46 cm  LV Volumes (MOD) LV vol d, MOD A2C: 101.0 ml LV vol d, MOD A4C: 104.0 ml LV vol s, MOD A2C: 31.0 ml LV vol s, MOD A4C: 26.3 ml LV SV MOD A2C:     70.0 ml LV SV MOD A4C:     104.0 ml LV SV MOD BP:      76.6 ml RIGHT VENTRICLE             IVC RV Basal diam:  4.40 cm     IVC diam: 1.90 cm RV S prime:     13.90 cm/s TAPSE (M-mode): 2.4 cm LEFT ATRIUM             Index        RIGHT ATRIUM           Index LA diam:         3.60 cm 1.62 cm/m   RA Area:     25.40 cm LA Vol (A2C):   83.1 ml 37.49 ml/m  RA Volume:   75.80 ml  34.19 ml/m LA Vol (A4C):   42.4 ml 19.13 ml/m LA Biplane Vol: 59.4 ml 26.80 ml/m  AORTIC VALVE AV Area (Vmax):    1.19 cm AV Area (Vmean):   1.32 cm AV Area (VTI):     1.55 cm AV Vmax:           371.00 cm/s AV Vmean:          256.000 cm/s AV VTI:            0.738 m AV Peak Grad:      55.1 mmHg AV Mean Grad:      30.0 mmHg LVOT Vmax:         128.00 cm/s LVOT Vmean:        97.800 cm/s LVOT VTI:          0.331 m LVOT/AV VTI ratio: 0.45  AORTA Ao Root diam: 3.90 cm Ao Asc diam:  3.10 cm MITRAL VALVE MV Area (PHT): 2.33 cm     SHUNTS MV Decel Time: 326 msec     Systemic VTI:  0.33 m MV E velocity: 40.50 cm/s   Systemic Diam: 2.10 cm MV A velocity: 104.00 cm/s MV E/A ratio:  0.39 Dina Rich MD Electronically signed by Dina Rich MD Signature Date/Time: 03/27/2023/1:59:42 PM    Final    VAS [place] LOWER EXTREMITY VENOUS (DVT)  Result Date: 03/27/2023  Lower Venous DVT Study Patient Name:  GWEN EDLER Ravelo  Date of Exam:   03/27/2023 Medical Rec #: [id number]        Accession #:    [id number] Date of Birth: 08-22-1941        Patient Gender: M Patient Age:   53 years Exam Location:  [facility] Procedure:  VAS [place] LOWER EXTREMITY VENOUS (DVT) Referring Phys: Scheryl Marten XU --------------------------------------------------------------------------------  Indications: Stroke.  Limitations: Poor ultrasound/tissue interface. Comparison Study: Previous exam on 09/06/2017 was negative for DVT Performing Technologist: Ernestene Mention RVT, RDMS  Examination Guidelines: A complete evaluation includes B-mode imaging, spectral Doppler, color Doppler, and power Doppler as needed of all accessible portions of each vessel. Bilateral testing is considered an integral part of a complete examination. Limited examinations for reoccurring indications may be performed as noted. The reflux portion of the exam is performed with the  patient in reverse Trendelenburg.  +---------+---------------+---------+-----------+----------+--------------+ RIGHT    CompressibilityPhasicitySpontaneityPropertiesThrombus Aging +---------+---------------+---------+-----------+----------+--------------+ CFV      Full           Yes      Yes                                 +---------+---------------+---------+-----------+----------+--------------+ SFJ      Full                                                        +---------+---------------+---------+-----------+----------+--------------+ FV Prox  Full           Yes      Yes                                 +---------+---------------+---------+-----------+----------+--------------+ FV Mid   Full           No       Yes        pulsatile                +---------+---------------+---------+-----------+----------+--------------+ FV DistalFull           No       Yes        pulsatile                +---------+---------------+---------+-----------+----------+--------------+ PFV      Full                                                        +---------+---------------+---------+-----------+----------+--------------+ POP      Full           No       Yes        pulsatile                +---------+---------------+---------+-----------+----------+--------------+ PTV      Full                                                        +---------+---------------+---------+-----------+----------+--------------+ PERO     Full                                                        +---------+---------------+---------+-----------+----------+--------------+   +---------+---------------+---------+-----------+----------+-------------------+  LEFT     CompressibilityPhasicitySpontaneityPropertiesThrombus Aging      +---------+---------------+---------+-----------+----------+-------------------+ CFV      Full           Yes      Yes                                       +---------+---------------+---------+-----------+----------+-------------------+ SFJ      Full                                                             +---------+---------------+---------+-----------+----------+-------------------+ FV Prox  Full           Yes      Yes                                      +---------+---------------+---------+-----------+----------+-------------------+ FV Mid   Full           Yes      Yes                                      +---------+---------------+---------+-----------+----------+-------------------+ FV DistalFull           Yes      Yes                                      +---------+---------------+---------+-----------+----------+-------------------+ PFV      Full                                                             +---------+---------------+---------+-----------+----------+-------------------+ POP      Full           Yes      Yes                                      +---------+---------------+---------+-----------+----------+-------------------+ PTV                                                   Not well visualized +---------+---------------+---------+-----------+----------+-------------------+ PERO                                                  Not well visualized +---------+---------------+---------+-----------+----------+-------------------+     Summary: BILATERAL: - No evidence of deep vein thrombosis seen in the lower extremities, bilaterally. -No evidence of popliteal cyst, bilaterally.   *See table(s) above for measurements and observations. Electronically signed by Heath Lark on 03/27/2023  at 1:35:09 PM.    Final    EEG adult  Result Date: 03/26/2023 Charlsie Quest, MD     03/26/2023 12:04 PM Patient Name: GREGORY BARRICK MRN: [id number] Epilepsy Attending: Charlsie Quest Referring Physician/Provider: Gordy Councilman, MD Date: 03/26/2023 Duration: 22.32 mins Patient history: 82yo M  getting eeg to evaluate for seizure. Level of alertness: Awake AEDs during EEG study: None Technical aspects: This EEG study was done with scalp electrodes positioned according to the 10-20 International system of electrode placement. Electrical activity was reviewed with band pass filter of 1-70Hz , sensitivity of 7 uV/mm, display speed of 17mm/sec with a 60Hz  notched filter applied as appropriate. EEG data were recorded continuously and digitally stored.  Video monitoring was available and reviewed as appropriate. Description: The posterior dominant rhythm consists of 7 Hz activity of moderate voltage (25-35 uV) seen predominantly in posterior head regions, symmetric and reactive to eye opening and eye closing. EEG showed continuous generalized 5 to 7 Hz theta slowing admixed with intermittent 2-3Hz  delta slowing. Hyperventilation and photic stimulation were not performed.   Of note, eeg was technically difficult due to significant electrical artifact. ABNORMALITY - Continuous slow, generalized - Background slow IMPRESSION: This technically difficult study is suggestive of moderate diffuse encephalopathy. No seizures or epileptiform discharges were seen throughout the recording. Charlsie Quest   CT Angio Head W or Wo Contrast  Result Date: 03/25/2023 CLINICAL DATA:  Initial evaluation for stroke. EXAM: CT ANGIOGRAPHY HEAD AND NECK TECHNIQUE: Multidetector CT imaging of the head and neck was performed using the standard protocol during bolus administration of intravenous contrast. Multiplanar CT image reconstructions and MIPs were obtained to evaluate the vascular anatomy. Carotid stenosis measurements (when applicable) are obtained utilizing NASCET criteria, using the distal internal carotid diameter as the denominator. RADIATION DOSE REDUCTION: This exam was performed according to the departmental dose-optimization program which includes automated exposure control, adjustment of the mA and/or kV according to  patient size and/or use of iterative reconstruction technique. CONTRAST:  OMNIPAQUE IOHEXOL 350 MG/ML SOLN COMPARISON:  MRI and CT from earlier the same day. FINDINGS: CTA NECK FINDINGS Aortic arch: Visualized aortic arch normal caliber with standard branch pattern. Moderate atheromatous change about the arch itself, with irregular soft plaque/thrombus protruding into the aortic lumen (series 504, image 23). Right carotid system: Right common and internal carotid arteries are patent without stenosis or dissection. Left carotid system: Left common and internal carotid arteries are patent without stenosis or dissection. Vertebral arteries: Both vertebral arteries arise from the subclavian arteries. No proximal subclavian artery stenosis. Mild atheromatous irregularity about the vertebral arteries without high-grade stenosis or dissection. Skeleton: no discrete or worrisome osseous lesions. Moderate degenerative spondylosis at C4-5 through C6-7. Other neck: No other acute soft tissue abnormality within the neck. Upper chest: Visualized upper chest demonstrates no acute finding. Review of the MIP images confirms the above findings CTA HEAD FINDINGS Anterior circulation: Mild atheromatous change within the carotid siphons without stenosis. A1 segments, anterior communicating complex common anterior cerebral arteries widely patent. No M1 stenosis or occlusion. No proximal MCA branch occlusion or stenosis. Distal MCA branches perfused and symmetric. Posterior circulation: Both V4 segments patent without stenosis. Both PICA patent. 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 Basilar diminutive but widely patent. Superior cerebral arteries patent bilaterally. Predominant fetal type origin of the PCAs. Both PCAs widely patent to their distal aspects without stenosis. Venous sinuses: Grossly patent allowing for timing the contrast bolus. Anatomic variants: As above.  No aneurysm. Review of the MIP  images confirms the above findings IMPRESSION: 1. Negative CTA for large vessel occlusion or other emergent finding. 2. Mild atheromatous disease for age without hemodynamically significant or correctable stenosis. Aortic Atherosclerosis (ICD10-I70.0). Electronically Signed   By: Rise Mu M.D.   On: 03/25/2023 22:09   CT Angio Neck W and/or Wo Contrast  Result Date: 03/25/2023 CLINICAL DATA:  Initial evaluation for stroke. EXAM: CT ANGIOGRAPHY HEAD AND NECK TECHNIQUE: Multidetector CT imaging of the head and neck was performed using the standard protocol during bolus administration of intravenous contrast. Multiplanar CT image reconstructions and MIPs were obtained to evaluate the vascular anatomy. Carotid stenosis measurements (when applicable) are obtained utilizing NASCET criteria, using the distal internal carotid diameter as the denominator. RADIATION DOSE REDUCTION: This exam was performed according to the departmental dose-optimization program which includes automated exposure control, adjustment of the mA and/or kV according to patient size and/or use of iterative reconstruction technique. CONTRAST:  OMNIPAQUE IOHEXOL 350 MG/ML SOLN COMPARISON:  MRI and CT from earlier the same day. FINDINGS: CTA NECK FINDINGS Aortic arch: Visualized aortic arch normal caliber with standard branch pattern. Moderate atheromatous change about the arch itself, with irregular soft plaque/thrombus protruding into the aortic lumen (series 504, image 23). Right carotid system: Right common and internal carotid arteries are patent without stenosis or dissection. Left carotid system: Left common and internal carotid arteries are patent without stenosis or dissection. Vertebral arteries: Both vertebral arteries arise from the subclavian arteries. No proximal subclavian artery stenosis. Mild atheromatous irregularity about the vertebral arteries without high-grade stenosis or dissection. Skeleton: no discrete or  worrisome osseous lesions. Moderate degenerative spondylosis at C4-5 through C6-7. Other neck: No other acute soft tissue abnormality within the neck. Upper chest: Visualized upper chest demonstrates no acute finding. Review of the MIP images confirms the above findings CTA HEAD FINDINGS Anterior circulation: Mild atheromatous change within the carotid siphons without stenosis. A1 segments, anterior communicating complex common anterior cerebral arteries widely patent. No M1 stenosis or occlusion. No proximal MCA branch occlusion or stenosis. Distal MCA branches perfused and symmetric. Posterior circulation: Both V4 segments patent without stenosis. Both PICA patent. 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 Basilar diminutive but widely patent. Superior cerebral arteries patent bilaterally. Predominant fetal type origin of the PCAs. Both PCAs widely patent to their distal aspects without stenosis. Venous sinuses: Grossly patent allowing for timing the contrast bolus. Anatomic variants: As above.  No aneurysm. Review of the MIP images confirms the above findings IMPRESSION: 1. Negative CTA for large vessel occlusion or other emergent finding. 2. Mild atheromatous disease for age without hemodynamically significant or correctable stenosis. Aortic Atherosclerosis (ICD10-I70.0). Electronically Signed   By: Rise Mu M.D.   On: 03/25/2023 22:09   MR BRAIN WO CONTRAST  Result Date: 03/25/2023 CLINICAL DATA:  Initial evaluation for word-finding difficulty, recent stroke. EXAM: MRI HEAD WITHOUT CONTRAST TECHNIQUE: Multiplanar, multiecho pulse sequences of the brain and surrounding structures were obtained without intravenous contrast. COMPARISON:  CT from earlier same day as well as prior MRI from 03/05/2023. FINDINGS: Brain: Age-appropriate cerebral volume loss. Patchy T2/FLAIR hyperintensity involving the periventricular deep white matter both cerebral hemispheres, consistent with  moderate chronic microvascular ischemic disease. Few scatter remote lacunar infarcts present about the hemispheric cerebral white matter and deep gray nuclei. Few scattered small remote bilateral cerebellar infarcts noted. Patchy subcentimeter acute ischemic nonhemorrhagic infarct present at the anterior right frontal corona radiata/basal ganglia (series 9, image 32). Additional  subcentimeter acute ischemic nonhemorrhagic subcortical infarct noted at the contralateral left frontal lobe (series 9, image 39). Normal expected interval evolution of previously identified subcentimeter infarct at the right temporal occipital junction (series 9, image 23). No associated hemorrhage or mass effect. No other evidence for acute or subacute ischemia. No acute or chronic intracranial blood products. No mass lesion, mass effect or midline shift. No hydrocephalus or extra-axial fluid collection. Pituitary gland and suprasellar region within normal limits. Vascular: Major intracranial vascular flow voids are maintained. Skull and upper cervical spine: Craniocervical junction within normal limits. Bone marrow signal intensity normal. No scalp soft tissue abnormality. Sinuses/Orbits: Prior bilateral ocular lens replacement. Paranasal sinuses are largely clear. No significant mastoid effusion. Other: None. IMPRESSION: 1. 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 2. Normal expected interval evolution of previously identified subcentimeter infarct at the right temporoccipital junction. No associated hemorrhage or mass effect. 3. Underlying age-appropriate cerebral volume loss with moderate chronic microvascular ischemic disease, with a few scattered remote lacunar infarcts involving the hemispheric cerebral white matter, deep gray nuclei, and cerebellum. Electronically Signed   By: Rise Mu M.D.   On:  03/25/2023 18:19   CT HEAD WO CONTRAST  Result Date: 03/25/2023 CLINICAL DATA:  Fall EXAM: CT HEAD WITHOUT CONTRAST CT CERVICAL SPINE WITHOUT CONTRAST TECHNIQUE: Multidetector CT imaging of the head and cervical spine was performed following the standard protocol without intravenous contrast. Multiplanar CT image reconstructions of the cervical spine were also generated. RADIATION DOSE REDUCTION: This exam was performed according to the departmental dose-optimization program which includes automated exposure control, adjustment of the mA and/or kV according to patient size and/or use of iterative reconstruction technique. COMPARISON:  None Available. FINDINGS: CT HEAD FINDINGS Brain: No evidence of acute infarction, hemorrhage, hydrocephalus, extra-axial collection or mass lesion/mass effect. There is sequela of moderate severe chronic microvascular ischemic change. Vascular: No hyperdense vessel or unexpected calcification. Skull: Normal. Negative for fracture or focal lesion. Sinuses/Orbits: No middle ear or mastoid effusion. Paranasal sinuses clear. Bilateral lens replacement. Orbits are otherwise unremarkable Other: None. CT CERVICAL SPINE FINDINGS Alignment: There is straightening of the normal cervical lordosis. Skull base and vertebrae: No acute fracture. No primary bone lesion or focal pathologic process. Soft tissues and spinal canal: No prevertebral fluid or swelling. No visible canal hematoma. 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 Other: None IMPRESSION: 1. No CT evidence of intracranial injury. Sequela of moderate to severe chronic microvascular ischemic change. 2. No acute fracture or traumatic malalignment of the cervical spine. 3. 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 Electronically Signed   By: Lorenza Cambridge M.D.   On: 03/25/2023 13:04   CT Cervical Spine Wo Contrast  Result Date: 03/25/2023 CLINICAL DATA:  Fall EXAM: CT HEAD WITHOUT  CONTRAST CT CERVICAL SPINE WITHOUT CONTRAST TECHNIQUE: Multidetector CT imaging of the head and cervical spine was performed following the standard protocol without intravenous contrast. Multiplanar CT image reconstructions of the cervical spine were also generated. RADIATION DOSE REDUCTION: This exam was performed according to the departmental dose-optimization program which includes automated exposure control, adjustment of the mA and/or kV according to patient size and/or use of iterative reconstruction technique. COMPARISON:  None Available. FINDINGS: CT HEAD FINDINGS Brain: No evidence of acute infarction, hemorrhage, hydrocephalus, extra-axial collection or mass lesion/mass effect. There is sequela of moderate severe chronic microvascular ischemic change. Vascular: No hyperdense vessel or unexpected  calcification. Skull: Normal. Negative for fracture or focal lesion. Sinuses/Orbits: No middle ear or mastoid effusion. Paranasal sinuses clear. Bilateral lens replacement. Orbits are otherwise unremarkable Other: None. CT CERVICAL SPINE FINDINGS Alignment: There is straightening of the normal cervical lordosis. Skull base and vertebrae: No acute fracture. No primary bone lesion or focal pathologic process. Soft tissues and spinal canal: No prevertebral fluid or swelling. No visible canal hematoma. 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 Other: None IMPRESSION: 1. No CT evidence of intracranial injury. Sequela of moderate to severe chronic microvascular ischemic change. 2. No acute fracture or traumatic malalignment of the cervical spine. 3. 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 Electronically Signed   By: Lorenza Cambridge M.D.   On: 03/25/2023 13:04      Signature  -   Susa Raring M.D on 03/29/2023 at 7:18 AM   -  To page go to [email or web address]

## 2023-03-29 NOTE — TOC Progression Note (Signed)
Transition of Care [facility]) - Progression Note    Patient Details  Name: TAYM TWIST MRN: [id number] Date of Birth: 02/09/1941  Transition of [facility] CM/SW Contact  Mearl Latin, LCSW Phone Number: 03/29/2023, 2:30 PM  Clinical Narrative:    CSW spoke with patient's spouse and provided SNF bed offers. She requested [facility]. CSW alerted [facility] and will follow for timing of medical readiness, insurance authorization, and bed availability.    Expected Discharge Plan: Skilled Nursing Facility Barriers to Discharge: Continued Medical Work up, SNF Pending bed offer, English as a second language teacher  Expected Discharge Plan and Services In-house Referral: Clinical Social Work   Post Acute Care Choice: Skilled Nursing Facility Living arrangements for the past 2 months: Single Family Home                                       Social Determinants of Health (SDOH) Interventions SDOH Screenings   Food Insecurity: No Food Insecurity (03/02/2022)  Housing: Low Risk  (03/02/2022)  Transportation Needs: No Transportation Needs (03/02/2022)  Alcohol Screen: Low Risk  (03/02/2022)  Depression (PHQ2-9): Low Risk  (09/03/2022)  Financial Resource Strain: Low Risk  (03/02/2022)  Physical Activity: Insufficiently Active (03/02/2022)  Social Connections: Moderately Isolated (03/02/2022)  Stress: No Stress Concern Present (03/02/2022)  Tobacco Use: Medium Risk (03/25/2023)    Readmission Risk Interventions    07/15/2022   11:05 AM  Readmission Risk Prevention Plan  Post Dischage Appt Complete  Medication Screening Complete  Transportation Screening Complete

## 2023-03-29 NOTE — Progress Notes (Signed)
Physical Therapy Treatment Patient Details Name: Ryan Duke MRN: [id number] DOB: 21-Feb-1941 Today's Date: 03/29/2023   History of Present Illness Pt is 82 year old [facility] to [facility]  03/25/23 for AMS and frequent falls. Pt with recent rt temporal lobe infarct (outpt MRI 4/5). MRI on 4/25 showed patchy subcentimeter acute ischemic nonhemorrhagic infarcts involving the anterior right corona radiata/basal ganglia and contralateral subcortical left frontal lobe. Pt transferred to [facility] on 03/26/23.   PMH - HTN, DM, ckd, obesity, TAVR    PT Comments    Pt greeted resting in bed and agreeable to session with good progress towards acute goals. Session focused on gait progression with pt able to demonstrate gait for ~50' with RW support and up to mod A to manage RW as pt with R drift needing assist to correct and avoid obstacles. Pt needing max verbal cues to keep feet inside RW frame as pt picking up and moving RW to L and leaving feet behind. Pt needing min A to power up to stand with RW support and demonstrating standing exercises with cues for technique. Pt continues to be limited by some receptive and expressive difficulties, impaired balance/postural reactions and weakness L>R. Current plan remains appropriate to address deficits and maximize functional independence and decrease caregiver burden. Pt continues to benefit from skilled PT services to progress toward functional mobility goals.    Recommendations for follow up therapy are one component of a multi-disciplinary discharge planning process, led by the attending physician.  Recommendations may be updated based on patient status, additional functional criteria and insurance authorization.  Follow Up Recommendations  Can patient physically be transported by private vehicle: No    Assistance Recommended at Discharge Frequent or constant Supervision/Assistance  Patient can return home with the following A lot of help with walking and/or  transfers;A lot of help with bathing/dressing/bathroom;Assistance with cooking/housework;Direct supervision/assist for medications management;Direct supervision/assist for financial management;Assist for transportation;Help with stairs or ramp for entrance   Equipment Recommendations  Wheelchair (measurements PT);Wheelchair cushion (measurements PT)    Recommendations for Other Services       Precautions / Restrictions Precautions Precautions: Fall Restrictions Weight Bearing Restrictions: No     Mobility  Bed Mobility Overal bed mobility: Needs Assistance Bed Mobility: Supine to Sit     Supine to sit: Min guard, HOB elevated     General bed mobility comments: min guard for safety, pt without need for assist or cues for problem solving    Transfers Overall transfer level: Needs assistance Equipment used: Rolling walker (2 wheels) Transfers: Sit to/from Stand Sit to Stand: Min assist, Min guard           General transfer comment: min A to power up inititally down to min guard with practice throughout session, completing x5 during session from EOB and recliner    Ambulation/Gait Ambulation/Gait assistance: Mod assist Gait Distance (Feet): 50 Feet Assistive device: Rolling walker (2 wheels) Gait Pattern/deviations: Step-through pattern, Decreased step length - left, Decreased stride length, Trunk flexed, Drifts right/left Gait velocity: decr     General Gait Details: pt with short step length on L needing cues to increase, mod A to steady and facilitate forward momentum, mod A with hands on assist to guide RW as pt with R drift, max cues to keep feet inside Rw as pt moving RW to L leaving feet and body behin   Optometrist  Modified Rankin (Stroke Patients Only)       Balance Overall balance assessment: Needs assistance Sitting-balance support: No upper extremity supported, Feet supported Sitting balance-Leahy Scale: Fair      Standing balance support: Bilateral upper extremity supported, During functional activity, Reliant on assistive device for balance Standing balance-Leahy Scale: Poor Standing balance comment: Pt needs RW for support in standing and with mobility.                            Cognition Arousal/Alertness: Awake/alert Behavior During Therapy: WFL for tasks assessed/performed Overall Cognitive Status: Impaired/Different from baseline Area of Impairment: Attention, Memory, Following commands, Problem solving                 Orientation Level: Time, Disoriented to (stating 1924) Current Attention Level: Sustained Memory: Decreased short-term memory Following Commands: Follows one step commands with increased time Safety/Judgement: Decreased awareness of safety, Decreased awareness of deficits Awareness: Intellectual Problem Solving: Slow processing, Difficulty sequencing, Requires verbal cues, Requires tactile cues General Comments: Pt may have receptive communication problems impacting cognition as well.        Exercises Other Exercises Other Exercises: standing marching x20, standing marching with 3-5 second hold during hip.knee flexion for emphasis on single leg stance balance, reaching outside BOS in standing    General Comments        Pertinent Vitals/Pain Pain Assessment Pain Assessment: Faces Pain Score: 0-No pain Pain Intervention(s): Monitored during session    Home Living                          Prior Function            PT Goals (current goals can now be found in the care plan section) Acute Rehab PT Goals Patient Stated Goal: not stated PT Goal Formulation: With patient/family Time For Goal Achievement: 04/09/23 Progress towards PT goals: Progressing toward goals    Frequency    Min 3X/week      PT Plan      Co-evaluation              AM-PAC PT "6 Clicks" Mobility   Outcome Measure  Help needed turning from  your back to your side while in a flat bed without using bedrails?: A Little Help needed moving from lying on your back to sitting on the side of a flat bed without using bedrails?: A Little Help needed moving to and from a bed to a chair (including a wheelchair)?: A Little Help needed standing up from a chair using your arms (e.g., wheelchair or bedside chair)?: A Lot Help needed to walk in hospital room?: A Lot Help needed climbing 3-5 steps with a railing? : Total 6 Click Score: 14    End of Session Equipment Utilized During Treatment: Gait belt Activity Tolerance: Patient tolerated treatment well Patient left: with call bell/phone within reach;in chair;with chair alarm set Nurse Communication: Mobility status PT Visit Diagnosis: Unsteadiness on feet (R26.81);Other abnormalities of gait and mobility (R26.89);Other symptoms and signs involving the nervous system (R29.898);History of falling (Z91.81);Repeated falls (R29.6);Muscle weakness (generalized) (M62.81)     Time: 5366-4403 PT Time Calculation (min) (ACUTE ONLY): 28 min  Charges:  $Gait Training: 8-22 mins $Therapeutic Activity: 8-22 mins                     Dajia Gunnels R. PTA Acute Rehabilitation Services Office: [phone]  Marlana Salvage Jaina Morin 03/29/2023, 3:24 PM

## 2023-03-29 NOTE — Progress Notes (Signed)
STROKE TEAM PROGRESS NOTE   INTERVAL HISTORY No family is at the bedside. Pt lying in bed, mildly restless with some disorientation. Concerning for sundowning. BP on the high end, will add amlodipine 10. 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    Vitals:   03/29/23 0604 03/29/23 0805 03/29/23 1223 03/29/23 1619  BP: (!) 155/79 (!) 158/65 (!) 150/98 (!) 181/75  Pulse: 60 (!) 59 75 61  Resp: 20 15    Temp: 98.3 F (36.8 C) 98.3 F (36.8 C) 98 F (36.7 C) 98.1 F (36.7 C)  TempSrc: Oral Oral Oral Oral  SpO2: 95% 96%    Weight:      Height:       CBC:  Recent Labs  Lab 03/25/23 1145 03/26/23 0500 03/28/23 0618 03/29/23 0241  WBC 8.1   < > 8.3 8.1  NEUTROABS 5.8  --   --  5.3  HGB 16.5   < > 15.6 15.0  HCT 53.0*   < > 49.2 45.7  MCV 91.5   < > 90.9 89.3  PLT 130*   < > 148* 147*   < > = values in this interval not displayed.   Basic Metabolic Panel:  Recent Labs  Lab 03/28/23 0618 03/29/23 0241  NA 138 138  K 3.7 3.6  CL 104 105  CO2 25 27  GLUCOSE 126* 107*  BUN 19 23  CREATININE 1.42* 1.61*  CALCIUM 8.7* 8.6*  MG 2.1 2.2   Lipid Panel:  Recent Labs  Lab 03/25/23 1145  CHOL 156  TRIG 75  HDL 45  CHOLHDL 3.5  VLDL 15  LDLCALC 96   HgbA1c:  Recent Labs  Lab 03/25/23 1145  HGBA1C 6.7*   Urine Drug Screen:  Recent Labs  Lab 03/25/23 1350  LABOPIA NONE DETECTED  COCAINSCRNUR NONE DETECTED  LABBENZ NONE DETECTED  AMPHETMU NONE DETECTED  THCU NONE DETECTED  LABBARB NONE DETECTED    Alcohol Level  Recent Labs  Lab 03/25/23 1145  ETH <10    IMAGING past 24 hours CT CORONARY MORPH W/CTA COR W/SCORE W/CA W/CM &/OR WO/CM  Addendum Date: 03/29/2023   ADDENDUM REPORT: 03/29/2023 12:30 ADDENDUM: Elsewhere, there is a specific abnormal lymph node enlargement seen in the visualized chest including visualized axillary regions, hilum or mediastinum. Slightly patulous thoracic esophagus. Status post TAVR. The heart is  slightly enlarged. Please correlate with separate portion of the dictation describing the cardiac sections. There is some irregular partially calcified plaque along the visualized portions of the thoracic aorta. There is some breathing motion seen on several series. Dependent linear opacity at the bases likely scar or atelectasis. No pleural effusion once again there are some tiny nodules identified under 5 mm. Some of these calcified such as right lung series 12, image 50. Noncalcified subpleural focus identified series 12, image 70 measuring 4 mm. Nodule along the left interlobar fissure measures 6 mm on series 12, image 35. This nodule is new. Few other nodules as well. Non-contrast chest CT at 3-6 months is recommended. If the nodules are stable at time of repeat CT, then future CT at 18-24 months (from today's scan) is considered optional for low-risk patients, but is recommended for high-risk patients. This recommendation follows the consensus statement: Guidelines for Management of Incidental Pulmonary Nodules Detected on CT Images: From the Fleischner Society 2017; Radiology 2017; 284:228-243. Diffuse degenerative changes along the spine with bridging syndesmophytes and osteophytes. Old posterior right-sided rib fracture. Electronically Signed  By: Karen Kays M.D.   On: 03/29/2023 12:30   Result Date: 03/29/2023 CLINICAL DATA:  S/p TAVR procedure 06/2022 with 26mm Edwards Sapien 3 valve. Increased gradients through valve noted on echo EXAM: Cardiac TAVR CT TECHNIQUE: The patient was scanned on a [employer]. A 120 kV retrospective scan was triggered in the descending thoracic aorta at 111 HU's. Gantry rotation speed was 250 msecs and collimation was .6 mm. No beta blockade or nitro were given. The 3D data set was reconstructed in 5% intervals of the R-R cycle. Systolic and diastolic phases were analyzed on a dedicated work station using MPR, MIP and VRT modes. The patient received 100 cc of  contrast. FINDINGS: Aortic valve: S/p TAVR procedure with 26mm Edwards Sapien 3 valve. There is HALT/HAM of the leaflets of both the left and noncoronary cusps Right atrium: Moderate enlargement Right ventricle: Mild enlargement Pulmonary arteries: Dilated main pulmonary artery measuring 31mm Aorta: Dilated ascending aorta measuring 40mm. 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 Pulmonary veins: Normal configuration Left atrium: Mild enlargement Left ventricle: Normal size Pericardium: Normal thickness Coronary arteries: Normal origin IMPRESSION: 1. S/p TAVR procedure with 26mm Edwards Sapien 3 valve. There is HALT/HAM of the leaflets of both the left and noncoronary cusps 2. 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 On: 03/29/2023 11:43    PHYSICAL EXAM  Temp:  [97.8 F (36.6 C)-98.3 F (36.8 C)] 98.1 F (36.7 C) (04/29 1619) Pulse Rate:  [59-75] 61 (04/29 1619) Resp:  [15-20] 15 (04/29 0805) BP: (150-181)/(58-98) 181/75 (04/29 1619) SpO2:  [93 %-96 %] 96 % (04/29 0805)  General - Well nourished, well developed, in no apparent distress.  Ophthalmologic - fundi not visualized due to noncooperation.  Cardiovascular - Regular rhythm and rate.  Neuro - awake, alert, eyes open, orientated to place, but stated 2023 May and 82 years old, close to correct answer but not correct. Paucity of speech with intermittent word salad, still following simple commands, however, mild psychomotor slowing. Able to name and repeat. No gaze palsy, tracking bilaterally, visual field full. No facial droop. Tongue midline. Bilateral UEs 5/5, no drift. Bilaterally LEs 4/5, no drift. Sensation symmetrical bilaterally subjectively, b/l FTN intact grossly, gait not tested.     ASSESSMENT/PLAN Ryan Duke is  a 82 y.o. male with history of hypertension, hyperlipidemia, diabetes, CKD, status post TAVR, aortic atherosclerosis with mobile atheroma presenting with frequent falls at home memory difficulty, urinary incontinence, fatigue and generalized weakness.  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 CTA head & neck unremarkable but significant aortic atherosclerosis MRI 4/5 right MCA/PCA punctate infarcts MRI 4/25 small right frontal horn white matter and left MCA/ACA small infarcts in addition to right MCA/PCA punctate infarcts. 2D Echo EF 70 to 75% LE venous Doppler negative for DVT CT coronary showed thrombosis TAVR leaflet LDL 96 HgbA1c 6.7 UDS negative VTE prophylaxis -Lovenox aspirin 81 mg daily prior to admission, now on eliquis. Therapy recommendations: SNF Disposition: Pending  Aortic atheroma TEE in 06/2022 showed severe, mobile, grade 4 atheroma plaque involving the descending aorta are CT coronary again showed Severe mobile atheroma noted  in the descending aorta just after the take off of the left subclavian, appearance similar to prior CT 04/2022 On eliquis now  Aortic regurgitation  Had TAVR 06/2022 Current TTE showed the gradient across the prosthetic valves has  increased since 08/19/22 study(prior mean gradient , up to 30 mmHg on this study).  Cardiology on board CT coronary showed TAVR thrombosis Now on eliquis  Hypertension Home meds: Losartan 100 mg, atenolol 50 mg BP on the high end Resumed home losartan and atenolol Add norvasc 10 Long-term BP goal normotensive  Hyperlipidemia Home meds: Pravastatin 40 mg LDL 96, goal < 70 Changed to Crestor 20 Continue statin at discharge  Diabetes type II  Home meds: Jardiance 25 mg, insulin pen HgbA1c 6.7, goal <  7.0 CBGs SSI Close PCP follow-up as outpatient for better DM control  Other Stroke Risk Factors Advanced Age >/= 65  Obesity, Body mass index is 31.38 kg/m., BMI >/= 30 associated with increased stroke risk, recommend weight loss, diet and exercise as appropriate  Status post TAVR  Other Active Problems CKD 2, creatinine 1.34-1.37-1.42-1.61 Mild thrombocytopenia, platelet 130-142-147 B12 = 213, B12 supplement  Hospital day # 2  Neurology will sign off. Please call with questions. Pt will follow up with stroke clinic NP at [facility] in about 4 weeks. Thanks for the consult.    Marvel Plan, MD PhD Stroke Neurology 03/29/2023 5:11 PM   To contact Stroke Continuity provider, please refer to [email or web address]. After hours, contact General Neurology

## 2023-03-30 ENCOUNTER — Encounter (HOSPITAL_COMMUNITY)
Admission: EM | Disposition: A | Discharge status: Skilled Nursing Facility | Payer: Self-pay | Source: Home / Self Care | Attending: Internal Medicine

## 2023-03-30 DIAGNOSIS — I639 Cerebral infarction, unspecified: Secondary | ICD-10-CM | POA: Diagnosis not present

## 2023-03-30 LAB — BASIC METABOLIC PANEL
Anion gap: 9 (ref 5–15)
BUN: 18 mg/dL (ref 8–23)
CO2: 23 mmol/L (ref 22–32)
Calcium: 8.8 mg/dL — ABNORMAL LOW (ref 8.9–10.3)
Chloride: 106 mmol/L (ref 98–111)
Creatinine, Ser: 1.26 mg/dL — ABNORMAL HIGH (ref 0.61–1.24)
GFR, Estimated: 56 mL/min — ABNORMAL LOW (ref >60)
Glucose, Bld: 91 mg/dL (ref 70–99)
Potassium: 3.7 mmol/L (ref 3.5–5.1)
Sodium: 138 mmol/L (ref 135–145)

## 2023-03-30 LAB — GLUCOSE, CAPILLARY
Glucose-Capillary: 100 mg/dL — ABNORMAL HIGH (ref 70–99)
Glucose-Capillary: 110 mg/dL — ABNORMAL HIGH (ref 70–99)
Glucose-Capillary: 127 mg/dL — ABNORMAL HIGH (ref 70–99)
Glucose-Capillary: 159 mg/dL — ABNORMAL HIGH (ref 70–99)
Glucose-Capillary: 49 mg/dL — ABNORMAL LOW (ref 70–99)
Glucose-Capillary: 65 mg/dL — ABNORMAL LOW (ref 70–99)
Glucose-Capillary: 67 mg/dL — ABNORMAL LOW (ref 70–99)
Glucose-Capillary: 80 mg/dL (ref 70–99)
Glucose-Capillary: 95 mg/dL (ref 70–99)

## 2023-03-30 LAB — CBC WITH DIFFERENTIAL/PLATELET
Abs Immature Granulocytes: 0.06 10*3/uL (ref 0.00–0.07)
Basophils Absolute: 0.1 10*3/uL (ref 0.0–0.1)
Basophils Relative: 1 %
Eosinophils Absolute: 0.3 10*3/uL (ref 0.0–0.5)
Eosinophils Relative: 4 %
HCT: 47.3 % (ref 39.0–52.0)
Hemoglobin: 15.6 g/dL (ref 13.0–17.0)
Immature Granulocytes: 1 %
Lymphocytes Relative: 16 %
Lymphs Abs: 1.2 10*3/uL (ref 0.7–4.0)
MCH: 29.2 pg (ref 26.0–34.0)
MCHC: 33 g/dL (ref 30.0–36.0)
MCV: 88.4 fL (ref 80.0–100.0)
Monocytes Absolute: 0.7 10*3/uL (ref 0.1–1.0)
Monocytes Relative: 9 %
Neutro Abs: 5.3 10*3/uL (ref 1.7–7.7)
Neutrophils Relative %: 69 %
Platelets: 167 10*3/uL (ref 150–400)
RBC: 5.35 MIL/uL (ref 4.22–5.81)
RDW: 14.6 % (ref 11.5–15.5)
WBC: 7.7 10*3/uL (ref 4.0–10.5)
nRBC: 0 % (ref 0.0–0.2)

## 2023-03-30 LAB — VITAMIN B1: Vitamin B1 (Thiamine): 148.5 nmol/L (ref 66.5–200.0)

## 2023-03-30 LAB — BRAIN NATRIURETIC PEPTIDE: B Natriuretic Peptide: 791.3 pg/mL — ABNORMAL HIGH (ref 0.0–100.0)

## 2023-03-30 LAB — MAGNESIUM: Magnesium: 2.1 mg/dL (ref 1.7–2.4)

## 2023-03-30 SURGERY — ECHOCARDIOGRAM, TRANSESOPHAGEAL
Anesthesia: Monitor Anesthesia Care

## 2023-03-30 MED ORDER — HYDRALAZINE HCL 20 MG/ML IJ SOLN
10.0000 mg | INTRAMUSCULAR | Status: DC | PRN
  Administered 2023-03-31: 10 mg via INTRAVENOUS
  Filled 2023-03-30: qty 1

## 2023-03-30 NOTE — Progress Notes (Signed)
Occupational Therapy Treatment Patient Details Name: Ryan Duke MRN: [id number] DOB: 1941-07-30 Today's Date: 03/30/2023   History of present illness Pt is 82 year old presented to [facility] on  03/25/23 for AMS and frequent falls. Pt with recent rt temporal lobe infarct (outpt MRI 4/5). MRI on 4/25 showed patchy subcentimeter acute ischemic nonhemorrhagic infarcts involving the anterior right corona radiata/basal ganglia and contralateral subcortical left frontal lobe. Pt transferred to [facility] on 03/26/23.   PMH - HTN, DM, ckd, obesity, TAVR   OT comments  Patient demonstrating good progress with OT treatment with min guard to get to EOB and min assist to stand to ambulate to sink. Patient able to perform UB bathing and grooming standing at sink requiring one extremity support for balance. Patient performed peri area bathing standing with assistance to complete and for thoroughness. During mobility to sink and recliner patient required cues to stay with RW and with walker management. Patient will benefit from continued inpatient follow up therapy, <3 hours/day to increase independence and safety with self care and functional transfers. Acute OT to continue to follow.    Recommendations for follow up therapy are one component of a multi-disciplinary discharge planning process, led by the attending physician.  Recommendations may be updated based on patient status, additional functional criteria and insurance authorization.    Assistance Recommended at Discharge Frequent or constant Supervision/Assistance  Patient can return home with the following  A little help with walking and/or transfers;Assist for transportation;Direct supervision/assist for financial management;Help with stairs or ramp for entrance;Direct supervision/assist for medications management   Equipment Recommendations  None recommended by OT    Recommendations for Other Services      Precautions / Restrictions  Precautions Precautions: Fall Restrictions Weight Bearing Restrictions: No       Mobility Bed Mobility Overal bed mobility: Needs Assistance Bed Mobility: Supine to Sit     Supine to sit: Min guard, HOB elevated     General bed mobility comments: cues for rail use and increased time    Transfers Overall transfer level: Needs assistance Equipment used: Rolling walker (2 wheels) Transfers: Sit to/from Stand, Bed to chair/wheelchair/BSC Sit to Stand: Min assist           General transfer comment: min assist to stand from EOB and chair, min guard when up with cues for hand placement and safety     Balance Overall balance assessment: Needs assistance Sitting-balance support: No upper extremity supported, Feet supported Sitting balance-Leahy Scale: Fair     Standing balance support: Single extremity supported, Bilateral upper extremity supported, During functional activity Standing balance-Leahy Scale: Poor Standing balance comment: reliant on external support, required one extremity support when standing at sink                           ADL either performed or assessed with clinical judgement   ADL Overall ADL's : Needs assistance/impaired     Grooming: Wash/dry hands;Wash/dry face;Oral care;Minimal assistance;Standing Grooming Details (indicate cue type and reason): stood at sink with one extremity support, requiring assistance with toothpaste Upper Body Bathing: Supervision/ safety;Sitting Upper Body Bathing Details (indicate cue type and reason): at sink Lower Body Bathing: Moderate assistance;Sit to/from stand Lower Body Bathing Details (indicate cue type and reason): assistance with washing bottom                       General ADL Comments: mod assist for cleaning bottom  for throughness    Extremity/Trunk Assessment              Vision       Perception     Praxis      Cognition Arousal/Alertness: Awake/alert Behavior  During Therapy: WFL for tasks assessed/performed Overall Cognitive Status: Impaired/Different from baseline Area of Impairment: Attention, Memory, Following commands, Problem solving                 Orientation Level: Disoriented to, Place, Time Current Attention Level: Sustained Memory: Decreased short-term memory Following Commands: Follows one step commands with increased time Safety/Judgement: Decreased awareness of safety, Decreased awareness of deficits Awareness: Intellectual Problem Solving: Slow processing, Difficulty sequencing, Requires verbal cues, Requires tactile cues General Comments: increased time to follow directions, cues for safety        Exercises      Shoulder Instructions       General Comments VSS on RA    Pertinent Vitals/ Pain       Pain Assessment Pain Assessment: No/denies pain Pain Intervention(s): Monitored during session  Home Living                                          Prior Functioning/Environment              Frequency  Min 2X/week        Progress Toward Goals  OT Goals(current goals can now be found in the care plan section)  Progress towards OT goals: Progressing toward goals  Acute Rehab OT Goals Patient Stated Goal: get better OT Goal Formulation: With patient Time For Goal Achievement: 04/10/23 Potential to Achieve Goals: Good ADL Goals Pt Will Perform Grooming: with supervision;standing Pt Will Perform Lower Body Bathing: with supervision;sit to/from stand Pt Will Perform Lower Body Dressing: with supervision;sit to/from stand Pt Will Transfer to Toilet: with supervision;ambulating;bedside commode Pt Will Perform Toileting - Clothing Manipulation and hygiene: with supervision;sit to/from stand Pt Will Perform Tub/Shower Transfer: Shower transfer;with supervision;shower seat;ambulating;rolling walker  Plan Discharge plan remains appropriate    Co-evaluation                  AM-PAC OT "6 Clicks" Daily Activity     Outcome Measure   Help from another person eating meals?: None Help from another person taking care of personal grooming?: A Little Help from another person toileting, which includes using toliet, bedpan, or urinal?: A Little Help from another person bathing (including washing, rinsing, drying)?: A Little Help from another person to put on and taking off regular upper body clothing?: A Little Help from another person to put on and taking off regular lower body clothing?: A Little 6 Click Score: 19    End of Session Equipment Utilized During Treatment: Rolling walker (2 wheels);Gait belt  OT Visit Diagnosis: Unsteadiness on feet (R26.81);Muscle weakness (generalized) (M62.81);Other abnormalities of gait and mobility (R26.89);Other symptoms and signs involving cognitive function   Activity Tolerance Patient tolerated treatment well   Patient Left in chair;with call bell/phone within reach;with chair alarm set   Nurse Communication Mobility status        Time: [phone] OT Time Calculation (min): 30 min  Charges: OT General Charges $OT Visit: 1 Visit OT Treatments $Self Care/Home Management : 23-37 mins  Alfonse Flavors, OTA Acute Rehabilitation Services  Office [phone]   Dewain Penning 03/30/2023, 9:51 AM

## 2023-03-30 NOTE — Progress Notes (Addendum)
PROGRESS NOTE                                                                                                                                                                                                             Patient Demographics:    Ryan Duke, is a 82 y.o. male, DOB - October 21, 1941, ZOX:[id number]  Outpatient Primary MD for the patient is [name], MD    LOS - 3  Admit date - 03/25/2023    Chief Complaint  Patient presents with   Altered Mental Status       Brief Narrative (HPI from H&P)   82 years old Male with PMH significant for insulin-dependent type 2 diabetes, CKD stage IIIa, severe aortic stenosis s/p TAVR, hypertension, hyperlipidemia, BPH presented to the ED for the evaluation of recurrent falls.  Patient reports recurrent fall for last 1 month,  states he lost his balance when walking resulting in falls.  Patient did have an outpatient MRI which showed 4 mm acute subcortical nonhemorrhagic white matter infarct in the posterior right temporal lobe.  In the ER workup again consistent with acute hemic stroke, he was seen by neurology team, stroke workup being done subsequently he has developed severe encephalopathy as well.  He now most likely will require SNF placement.   Subjective:   Patient in bed, appears comfortable, denies any headache, no fever, no chest pain or pressure, no shortness of breath , no abdominal pain. No focal weakness.   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 He has been seen by neurology team and has undergone full stroke workup, TTE noted, currently on dual antiplatelet therapy, LDL above goal hence switched from Lipitor to Crestor, A1c satisfactory, seen by stroke team underwent CT scan of his chest showing TAVR valve site thrombosis, seen by  cardiology and stroke team both.  Now on Eliquis and statin and DAPT has been stopped, loop recorder not required anymore per neurology as we have a source of stroke.  Will require SNF likely in the next 1 to 2 days.   Acute metabolic encephalopathy.  Waxing and waning, likely in the setting of acute stroke, underlying age-related cognitive dysfunction, MRI evidence of some underlying vascular dementia and being in unfamiliar  setting, stable TSH, RPR and HIV status, B12 low normal and placed on oral supplementation.  Dyslipidemia.  Switch to Crestor for better control.  Hypertension.  Will start titrating with medications after about 4 to 5 days out of acute event.  CKD 3A.  Creatinine appears to be at baseline of 1.6.  Severe aortic stenosis s/p TAVR.  Supportive care for now, TTE noted, CT scan confirms TAVR site clot now on Eliquis, seen by cardiology.  Multiple falls at home.  Likely due to combination of #1 and 2 above, CT head, CTA head and neck no acute fractures or bleed.  PT, OT and SNF.  Low NML B12 - PO B12  DM type II.  On high doses of Lantus and sliding scale, dose adjusted on 03/27/2023 for better control, poor oral intake hence will monitor cautiously.  Lab Results  Component Value Date   HGBA1C 6.7 (H) 03/25/2023   CBG (last 3)  Recent Labs    03/30/23 0041 03/30/23 0101 03/30/23 0435  GLUCAP 67* 80 100*         Condition - Extremely Guarded  Family Communication  :  called wife [phone] 03/27/2023 at 7:44 AM and message left, updated on 03/28/23  Code Status :  DNR  Consults  :  Neuro, Cards  PUD Prophylaxis :    Procedures  :     TEE -  MRI - 1. 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 2. Normal expected interval evolution of previously identified subcentimeter infarct at the right temporoccipital junction. No associated  hemorrhage or mass effect. 3. Underlying age-appropriate cerebral volume loss with moderate chronic microvascular ischemic disease, with a few scattered remote lacunar infarcts involving the hemispheric cerebral white matter, deep gray nuclei, and cerebellum.  TTE - 1. Left ventricular ejection fraction, by estimation, is 70 to 75%. The left ventricle has hyperdynamic function. Left ventricular endocardial border not optimally defined to evaluate regional wall motion. There is severe left ventricular hypertrophy. Left ventricular diastolic parameters are indeterminate.  2. Right ventricular systolic function is normal. The right ventricular size is normal. Tricuspid regurgitation signal is inadequate for assessing PA pressure.  3. The mitral valve is normal in structure. No evidence of mitral valve regurgitation. No evidence of mitral stenosis.  4. Edwards Sapien 3 Ultra Resilia THV (size 26 mm). Operative note 07/14/2022 indicates after initial valve deployed significant paravalvular leak, a second Edwards Sapien 3 ultra resilia THV 26 mm valve was then also deployed which significantly reduced  paravalvular leak. The gradient across the prosthetic valves has increased since 08/19/22 study(prior mean gradient , up to 30 mmHg on this study). Consider TEE for further structural evaluation of prosthetic valve. . The aortic valve was not well visualized. Aortic valve regurgitation Trivial paravalvular leak.  5. The inferior vena cava is normal in size with greater than 50% respiratory variability, suggesting right atrial pressure of 3 mmHg  EEG - nonspecific encephalopathy.    CT Head and C Spine - 1. No CT evidence of intracranial injury. Sequela of moderate to severe chronic microvascular ischemic change. 2. No acute fracture or traumatic malalignment of the cervical spine. 3. 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  CTA - 1. Negative CTA for large vessel occlusion or other emergent finding. 2. Mild  atheromatous disease for age without hemodynamically significant or correctable stenosis. Aortic Atherosclerosis       Disposition Plan  :  Status is: Observation  DVT Prophylaxis  :    apixaban (ELIQUIS) tablet 2.5 mg Start: 03/29/23 1400 apixaban (ELIQUIS) tablet 2.5 mg   Lab Results  Component Value Date   PLT 167 03/30/2023    Diet :  Diet Order             DIET SOFT Room service appropriate? No; Fluid consistency: Thin  Diet effective now                    Inpatient Medications  Scheduled Meds:  amLODipine  10 mg Oral Daily   apixaban  2.5 mg Oral BID   atenolol  50 mg Oral Daily   vitamin B-12  1,000 mcg Oral Daily   insulin aspart  0-9 Units Subcutaneous Q6H   insulin glargine-yfgn  15 Units Subcutaneous BID   losartan  100 mg Oral Daily   rosuvastatin  20 mg Oral Daily   tamsulosin  0.4 mg Oral Daily   Continuous Infusions: PRN Meds:.acetaminophen **OR** acetaminophen (TYLENOL) oral liquid 160 mg/5 mL **OR** acetaminophen, hydrALAZINE, senna-docusate  Antibiotics  :    Anti-infectives (From admission, onward)    None         Objective:   Vitals:   03/29/23 2047 03/29/23 2134 03/30/23 0101 03/30/23 0439  BP: (!) 150/55  (!) 174/84 (!) 141/76  Pulse: 62  65 70  Resp: 20  20 (!) 25  Temp: 98.4 F (36.9 C) 98 F (36.7 C) 98.2 F (36.8 C) 98 F (36.7 C)  TempSrc: Oral Oral Oral Oral  SpO2:      Weight:      Height:        Wt Readings from Last 3 Encounters:  03/25/23 102.1 kg  03/04/23 102.5 kg  01/19/23 105.1 kg     Intake/Output Summary (Last 24 hours) at 03/30/2023 0707 Last data filed at 03/30/2023 0100 Gross per 24 hour  Intake 960 ml  Output 1800 ml  Net -840 ml     Physical Exam  Awake and alert, not confused this morning, No new F.N deficits, Normal affect Experiment.AT,PERRAL Supple Neck, No JVD,   Symmetrical Chest wall movement, Good air movement bilaterally, CTAB RRR,No Gallops,Rubs or new Murmurs,  +ve  B.Sounds, Abd Soft, No tenderness,   No Cyanosis, Clubbing or edema     RN pressure injury documentation: Pressure Injury 03/25/23 Buttocks Left;Right Stage 2 -  Partial thickness loss of dermis presenting as a shallow open injury with a red, pink wound bed without slough. (Active)  03/25/23 2300  Location: Buttocks  Location Orientation: Left;Right  Staging: Stage 2 -  Partial thickness loss of dermis presenting as a shallow open injury with a red, pink wound bed without slough.  Wound Description (Comments):   Present on Admission:   Dressing Type None 03/29/23 2127      Data Review:    Recent Labs  Lab 03/25/23 1145 03/26/23 0500 03/28/23 0618 03/29/23 0241 03/30/23 0537  WBC 8.1 8.4 8.3 8.1 7.7  HGB 16.5 16.6 15.6 15.0 15.6  HCT 53.0* 51.9 49.2 45.7 47.3  PLT 130* 142* 148* 147* 167  MCV 91.5 91.7 90.9 89.3 88.4  MCH 28.5 29.3 28.8 29.3 29.2  MCHC 31.1 32.0 31.7 32.8 33.0  RDW 14.7 14.7 14.7 14.6 14.6  LYMPHSABS 1.4  --   --  1.4 1.2  MONOABS 0.6  --   --  0.9 0.7  EOSABS 0.2  --   --  0.4 0.3  BASOSABS 0.1  --   --  0.1 0.1    Recent Labs  Lab 03/25/23 1145 03/25/23 1146 03/26/23 0500 03/27/23 0945 03/28/23 0618 03/29/23 0241 03/30/23 0537  NA 138  --  138  --  138 138 138  K 3.7  --  3.6  --  3.7 3.6 3.7  CL 103  --  103  --  104 105 106  CO2 25  --  26  --  25 27 23   ANIONGAP 10  --  9  --  9 6 9   GLUCOSE 119*  --  135*  --  126* 107* 91  BUN 22  --  20  --  19 23 18   CREATININE 1.34*  --  1.37*  --  1.42* 1.61* 1.26*  AST 20  --   --   --   --   --   --   ALT 13  --   --   --   --   --   --   ALKPHOS 78  --   --   --   --   --   --   BILITOT 1.0  --   --   --   --   --   --   ALBUMIN 3.9  --   --   --   --   --   --   TSH  --   --   --  4.606*  --   --   --   HGBA1C 6.7*  --   --   --   --   --   --   AMMONIA  --  12  --   --   --   --   --   BNP  --   --   --   --  752.5* 880.7* 791.3*  MG  --   --   --   --  2.1 2.2 2.1  CALCIUM 9.0  --  8.9   --  8.7* 8.6* 8.8*   Lab Results  Component Value Date   CHOL 156 03/25/2023   HDL 45 03/25/2023   LDLCALC 96 03/25/2023   LDLDIRECT 81.0 05/26/2018   TRIG 75 03/25/2023   CHOLHDL 3.5 03/25/2023    Lab Results  Component Value Date   HGBA1C 6.7 (H) 03/25/2023     Radiology Reports CT CORONARY MORPH W/CTA COR W/SCORE W/CA W/CM &/OR WO/CM  Addendum Date: 03/29/2023   ADDENDUM REPORT: 03/29/2023 12:30 ADDENDUM: Elsewhere, there is a specific abnormal lymph node enlargement seen in the visualized chest including visualized axillary regions, hilum or mediastinum. Slightly patulous thoracic esophagus. Status post TAVR. The heart is slightly enlarged. Please correlate with separate portion of the dictation describing the cardiac sections. There is some irregular partially calcified plaque along the visualized portions of the thoracic aorta. There is some breathing motion seen on several series. Dependent linear opacity at the bases likely scar or atelectasis. No pleural effusion once again there are some tiny nodules identified under 5 mm. Some of these calcified such as right lung series 12, image 50. Noncalcified subpleural focus identified series 12, image 70 measuring 4 mm. Nodule along the left interlobar fissure measures 6 mm on series 12, image 35. This nodule is new. Few other nodules as well. Non-contrast chest CT at 3-6 months is recommended. If the nodules are stable at time of repeat CT, then future CT at 18-24 months (from today's scan) is considered optional  for low-risk patients, but is recommended for high-risk patients. This recommendation follows the consensus statement: Guidelines for Management of Incidental Pulmonary Nodules Detected on CT Images: From the Fleischner Society 2017; Radiology 2017; 284:228-243. Diffuse degenerative changes along the spine with bridging syndesmophytes and osteophytes. Old posterior right-sided rib fracture. Electronically Signed   By: Karen Kays  M.D.   On: 03/29/2023 12:30   Result Date: 03/29/2023 CLINICAL DATA:  S/p TAVR procedure 06/2022 with 26mm Edwards Sapien 3 valve. Increased gradients through valve noted on echo EXAM: Cardiac TAVR CT TECHNIQUE: The patient was scanned on a [employer]. A 120 kV retrospective scan was triggered in the descending thoracic aorta at 111 HU's. Gantry rotation speed was 250 msecs and collimation was .6 mm. No beta blockade or nitro were given. The 3D data set was reconstructed in 5% intervals of the R-R cycle. Systolic and diastolic phases were analyzed on a dedicated work station using MPR, MIP and VRT modes. The patient received 100 cc of contrast. FINDINGS: Aortic valve: S/p TAVR procedure with 26mm Edwards Sapien 3 valve. There is HALT/HAM of the leaflets of both the left and noncoronary cusps Right atrium: Moderate enlargement Right ventricle: Mild enlargement Pulmonary arteries: Dilated main pulmonary artery measuring 31mm Aorta: Dilated ascending aorta measuring 40mm. 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 Pulmonary veins: Normal configuration Left atrium: Mild enlargement Left ventricle: Normal size Pericardium: Normal thickness Coronary arteries: Normal origin IMPRESSION: 1. S/p TAVR procedure with 26mm Edwards Sapien 3 valve. There is HALT/HAM of the leaflets of both the left and noncoronary cusps 2. 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 On: 03/29/2023 11:43   DG Chest Port 1 View  Result Date: 03/28/2023 CLINICAL DATA:  Shortness of breath EXAM: PORTABLE CHEST 1 VIEW COMPARISON:  10/19/2022 FINDINGS: Normal heart size. Transcatheter aortic valve replacement. Chronic blunting at the lateral left costophrenic sulcus which is from fat by most recent CT. There is no edema,  consolidation, effusion, or pneumothorax. IMPRESSION: No acute finding when compared to prior. Electronically Signed   By: Tiburcio Pea M.D.   On: 03/28/2023 06:19   ECHOCARDIOGRAM COMPLETE  Result Date: 03/27/2023    ECHOCARDIOGRAM REPORT   Patient Name:   KUBA SHEPHERD [place] Date of Exam: 03/26/2023 Medical Rec #:  [id number]       Height:       71.0 in Accession #:    [id number]      Weight:       225.0 lb Date of Birth:  [birth year]       BSA:          2.217 m Patient Age:    82 years        BP:           180/78 mmHg Patient Gender: M               HR:           84 bpm. Exam Location:  Inpatient Procedure: 2D Echo, Cardiac Doppler and Color Doppler Indications:    stroke  History:        Patient has prior history of Echocardiogram examinations. Risk                 Factors:Diabetes, Dyslipidemia and Hypertension.  Sonographer:  Mike Gip Referring Phys: [id number] [facility] KHALIQDINA IMPRESSIONS  1. Left ventricular ejection fraction, by estimation, is 70 to 75%. The left ventricle has hyperdynamic function. Left ventricular endocardial border not optimally defined to evaluate regional wall motion. There is severe left ventricular hypertrophy. Left ventricular diastolic parameters are indeterminate.  2. Right ventricular systolic function is normal. The right ventricular size is normal. Tricuspid regurgitation signal is inadequate for assessing PA pressure.  3. The mitral valve is normal in structure. No evidence of mitral valve regurgitation. No evidence of mitral stenosis.  4. Edwards Sapien 3 Ultra Resilia THV (size 26 mm). Operative note 07/14/2022 indicates after initial valve deployed significant paravalvular leak, a second Edwards Sapien 3 ultra resilia THV 26 mm valve was then also deployed which significantly reduced  paravalvular leak. The gradient across the prosthetic valves has increased since 08/19/22 study(prior mean gradient , up to 30 mmHg on this study). Consider TEE for further structural  evaluation of prosthetic valve. . The aortic valve was not well visualized. Aortic valve regurgitation Trivial paravalvular leak.  5. The inferior vena cava is normal in size with greater than 50% respiratory variability, suggesting right atrial pressure of 3 mmHg. FINDINGS  Left Ventricle: Left ventricular ejection fraction, by estimation, is 70 to 75%. The left ventricle has hyperdynamic function. Left ventricular endocardial border not optimally defined to evaluate regional wall motion. The left ventricular internal cavity size was normal in size. There is severe left ventricular hypertrophy. Left ventricular diastolic parameters are indeterminate. Right Ventricle: The right ventricular size is normal. Right vetricular wall thickness was not well visualized. Right ventricular systolic function is normal. Tricuspid regurgitation signal is inadequate for assessing PA pressure. Left Atrium: Left atrial size was normal in size. Right Atrium: Right atrial size was normal in size. Pericardium: There is no evidence of pericardial effusion. Mitral Valve: The mitral valve is normal in structure. No evidence of mitral valve regurgitation. No evidence of mitral valve stenosis. Tricuspid Valve: The tricuspid valve is normal in structure. Tricuspid valve regurgitation is not demonstrated. No evidence of tricuspid stenosis. Aortic Valve: Edwards Sapien 3 Ultra Resilia THV (size 26 mm). Operative note 07/14/2022 indicates after initial valve deployed significant paravalvular leak, a second Edwards Sapien 3 ultra resilia THV 26 mm valve was then also deployed which significantly reduced paravalvular leak. The gradient across the prosthetic valves has increased since 08/19/22 study(prior mean gradient , up to 30 mmHg on this study). Consider TEE for further structural evaluation of prosthetic valve. The aortic valve was not well visualized. Aortic valve regurgitation Trivial paravalvular leak. Aortic valve mean gradient  measures 30.0 mmHg. Aortic valve peak gradient measures 55.1 mmHg. Aortic valve area, by VTI measures 1.55 cm. Pulmonic Valve: The pulmonic valve was not well visualized. Pulmonic valve regurgitation is not visualized. No evidence of pulmonic stenosis. Aorta: The aortic root is normal in size and structure. Venous: The inferior vena cava is normal in size with greater than 50% respiratory variability, suggesting right atrial pressure of 3 mmHg. IAS/Shunts: No atrial level shunt detected by color flow Doppler.  LEFT VENTRICLE PLAX 2D LVIDd:         3.90 cm      Diastology LVIDs:         2.20 cm      LV e' medial:    4.03 cm/s LV PW:         2.10 cm      LV E/e' medial:  10.0 LV IVS:  2.20 cm      LV e' lateral:   4.57 cm/s LVOT diam:     2.10 cm      LV E/e' lateral: 8.9 LV SV:         115 LV SV Index:   52 LVOT Area:     3.46 cm  LV Volumes (MOD) LV vol d, MOD A2C: 101.0 ml LV vol d, MOD A4C: 104.0 ml LV vol s, MOD A2C: 31.0 ml LV vol s, MOD A4C: 26.3 ml LV SV MOD A2C:     70.0 ml LV SV MOD A4C:     104.0 ml LV SV MOD BP:      76.6 ml RIGHT VENTRICLE             IVC RV Basal diam:  4.40 cm     IVC diam: 1.90 cm RV S prime:     13.90 cm/s TAPSE (M-mode): 2.4 cm LEFT ATRIUM             Index        RIGHT ATRIUM           Index LA diam:        3.60 cm 1.62 cm/m   RA Area:     25.40 cm LA Vol (A2C):   83.1 ml 37.49 ml/m  RA Volume:   75.80 ml  34.19 ml/m LA Vol (A4C):   42.4 ml 19.13 ml/m LA Biplane Vol: 59.4 ml 26.80 ml/m  AORTIC VALVE AV Area (Vmax):    1.19 cm AV Area (Vmean):   1.32 cm AV Area (VTI):     1.55 cm AV Vmax:           371.00 cm/s AV Vmean:          256.000 cm/s AV VTI:            0.738 m AV Peak Grad:      55.1 mmHg AV Mean Grad:      30.0 mmHg LVOT Vmax:         128.00 cm/s LVOT Vmean:        97.800 cm/s LVOT VTI:          0.331 m LVOT/AV VTI ratio: 0.45  AORTA Ao Root diam: 3.90 cm Ao Asc diam:  3.10 cm MITRAL VALVE MV Area (PHT): 2.33 cm     SHUNTS MV Decel Time: 326 msec      Systemic VTI:  0.33 m MV E velocity: 40.50 cm/s   Systemic Diam: 2.10 cm MV A velocity: 104.00 cm/s MV E/A ratio:  0.39 Dina Rich MD Electronically signed by Dina Rich MD Signature Date/Time: 03/27/2023/1:59:42 PM    Final    VAS [place] LOWER EXTREMITY VENOUS (DVT)  Result Date: 03/27/2023  Lower Venous DVT Study Patient Name:  SENICA CRALL [place]  Date of Exam:   03/27/2023 Medical Rec #: [id number]        Accession #:    [id number] Date of Birth: February 28, 1941        Patient Gender: M Patient Age:   18 years Exam Location:  [facility] Procedure:      VAS [place] LOWER EXTREMITY VENOUS (DVT) Referring Phys: Scheryl Marten XU --------------------------------------------------------------------------------  Indications: Stroke.  Limitations: Poor ultrasound/tissue interface. Comparison Study: Previous exam on 09/06/2017 was negative for DVT Performing Technologist: Ernestene Mention RVT, RDMS  Examination Guidelines: A complete evaluation includes B-mode imaging, spectral Doppler, color Doppler, and power Doppler as needed of all accessible portions of each  vessel. Bilateral testing is considered an integral part of a complete examination. Limited examinations for reoccurring indications may be performed as noted. The reflux portion of the exam is performed with the patient in reverse Trendelenburg.  +---------+---------------+---------+-----------+----------+--------------+ RIGHT    CompressibilityPhasicitySpontaneityPropertiesThrombus Aging +---------+---------------+---------+-----------+----------+--------------+ CFV      Full           Yes      Yes                                 +---------+---------------+---------+-----------+----------+--------------+ SFJ      Full                                                        +---------+---------------+---------+-----------+----------+--------------+ FV Prox  Full           Yes      Yes                                  +---------+---------------+---------+-----------+----------+--------------+ FV Mid   Full           No       Yes        pulsatile                +---------+---------------+---------+-----------+----------+--------------+ FV DistalFull           No       Yes        pulsatile                +---------+---------------+---------+-----------+----------+--------------+ PFV      Full                                                        +---------+---------------+---------+-----------+----------+--------------+ POP      Full           No       Yes        pulsatile                +---------+---------------+---------+-----------+----------+--------------+ PTV      Full                                                        +---------+---------------+---------+-----------+----------+--------------+ PERO     Full                                                        +---------+---------------+---------+-----------+----------+--------------+   +---------+---------------+---------+-----------+----------+-------------------+ LEFT     CompressibilityPhasicitySpontaneityPropertiesThrombus Aging      +---------+---------------+---------+-----------+----------+-------------------+ CFV      Full           Yes      Yes                                      +---------+---------------+---------+-----------+----------+-------------------+  SFJ      Full                                                             +---------+---------------+---------+-----------+----------+-------------------+ FV Prox  Full           Yes      Yes                                      +---------+---------------+---------+-----------+----------+-------------------+ FV Mid   Full           Yes      Yes                                      +---------+---------------+---------+-----------+----------+-------------------+ FV DistalFull           Yes      Yes                                       +---------+---------------+---------+-----------+----------+-------------------+ PFV      Full                                                             +---------+---------------+---------+-----------+----------+-------------------+ POP      Full           Yes      Yes                                      +---------+---------------+---------+-----------+----------+-------------------+ PTV                                                   Not well visualized +---------+---------------+---------+-----------+----------+-------------------+ PERO                                                  Not well visualized +---------+---------------+---------+-----------+----------+-------------------+     Summary: BILATERAL: - No evidence of deep vein thrombosis seen in the lower extremities, bilaterally. -No evidence of popliteal cyst, bilaterally.   *See table(s) above for measurements and observations. Electronically signed by Heath Lark on 03/27/2023 at 1:35:09 PM.    Final    EEG adult  Result Date: 03/26/2023 Charlsie Quest, MD     03/26/2023 12:04 PM Patient Name: FARAAZ WOLIN MRN: [id number] Epilepsy Attending: Charlsie Quest Referring Physician/Provider: Gordy Councilman, MD Date: 03/26/2023 Duration: 22.32 mins Patient history: 82yo M getting eeg to evaluate for seizure. Level of alertness: Awake AEDs during EEG study: None Technical aspects: This EEG study was  done with scalp electrodes positioned according to the 10-20 International system of electrode placement. Electrical activity was reviewed with band pass filter of 1-70Hz , sensitivity of 7 uV/mm, display speed of 53mm/sec with a 60Hz  notched filter applied as appropriate. EEG data were recorded continuously and digitally stored.  Video monitoring was available and reviewed as appropriate. Description: The posterior dominant rhythm consists of 7 Hz activity of moderate voltage (25-35 uV) seen predominantly in  posterior head regions, symmetric and reactive to eye opening and eye closing. EEG showed continuous generalized 5 to 7 Hz theta slowing admixed with intermittent 2-3Hz  delta slowing. Hyperventilation and photic stimulation were not performed.   Of note, eeg was technically difficult due to significant electrical artifact. ABNORMALITY - Continuous slow, generalized - Background slow IMPRESSION: This technically difficult study is suggestive of moderate diffuse encephalopathy. No seizures or epileptiform discharges were seen throughout the recording. Charlsie Quest      Signature  -   Susa Raring M.D on 03/30/2023 at 7:07 AM   -  To page go to [email or web address]

## 2023-03-30 NOTE — Inpatient Diabetes Management (Signed)
Inpatient Diabetes Program Recommendations  AACE/ADA: New Consensus Statement on Inpatient Glycemic Control   Target Ranges:  Prepandial:   less than 140 mg/dL      Peak postprandial:   less than 180 mg/dL (1-2 hours)      Critically ill patients:  140 - 180 mg/dL    Latest Reference Range & Units 03/30/23 00:41 03/30/23 01:01 03/30/23 04:35 03/30/23 08:09 03/30/23 08:37 03/30/23 09:03  Glucose-Capillary 70 - 99 mg/dL 67 (L) 80 409 (H) 49 (L) 65 (L) 110 (H)    Latest Reference Range & Units 03/29/23 06:11 03/29/23 08:07 03/29/23 12:22 03/29/23 16:28 03/29/23 21:57  Glucose-Capillary 70 - 99 mg/dL 811 (H) 96  Semglee 15 units 167 (H) 124 (H)   Semglee 15 units   Review of Glycemic Control  Diabetes history: DM2 Outpatient Diabetes medications: Lantus 30 units daily, Jardiance 25 mg daily Current orders for Inpatient glycemic control: Semglee 15 units BID, Novolog 0-9 units Q4H  Inpatient Diabetes Program Recommendations:    Insulin: Noted recurrent hypoglycemia; glucose down to 49 mg/dl today at 9:14 am. Patient received Semglee 15 units BID on 03/29/23. May want to consider decreasing Semglee to 15 units QHS.  Thanks, Orlando Penner, RN, MSN, CDCES Diabetes Coordinator Inpatient Diabetes Program [phone] (Team Pager from 8am to 5pm)

## 2023-03-30 NOTE — Progress Notes (Signed)
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  Plans to transfer to rehab.  Inpatient Medications    Scheduled Meds:  amLODipine  10 mg Oral Daily   apixaban  2.5 mg Oral BID   atenolol  50 mg Oral Daily   vitamin B-12  1,000 mcg Oral Daily   insulin aspart  0-9 Units Subcutaneous Q6H   insulin glargine-yfgn  15 Units Subcutaneous BID   losartan  100 mg Oral Daily   rosuvastatin  20 mg Oral Daily   tamsulosin  0.4 mg Oral Daily   Continuous Infusions:  PRN Meds: acetaminophen **OR** acetaminophen (TYLENOL) oral liquid 160 mg/5 mL **OR** acetaminophen, hydrALAZINE, senna-docusate   Vital Signs    Vitals:   03/30/23 0814 03/30/23 1100 03/30/23 1335 03/30/23 1336  BP: (!) 148/68   (!) 166/66  Pulse: 79 69  (!) 59  Resp: 15 (!) 21  (!) 24  Temp: (!) 97.3 F (36.3 C)   98.4 F (36.9 C)  TempSrc: Oral  Oral Oral  SpO2: (!) 89% 96%  96%  Weight:      Height:        Intake/Output Summary (Last 24 hours) at 03/30/2023 1409 Last data filed at 03/30/2023 1100 Gross per 24 hour  Intake 720 ml  Output 1000 ml  Net -280 ml      03/25/2023   10:52 AM 03/04/2023    1:40 PM 01/19/2023   10:58 AM  Last 3 Weights  Weight (lbs) 225 lb 226 lb 231 lb 12.8 oz  Weight (kg) 102.059 kg 102.513 kg 105.144 kg      Telemetry    SR - Personally Reviewed  ECG    EKG 4/25 SR - Personally Reviewed  Physical Exam   GEN: No acute distress.   Neck: No JVD Cardiac: RRR, with 3/6 SEM Respiratory: Clear to auscultation bilaterally. GI: Soft, nontender, non-distended  MS: No edema; No deformity. Neuro:  Nonfocal  Psych: Normal affect   Labs    High Sensitivity Troponin:  No results for input(s): "TROPONINIHS" in the last 720 hours.   Chemistry Recent  Labs  Lab 03/25/23 1145 03/26/23 0500 03/28/23 0618 03/29/23 0241 03/30/23 0537  NA 138   < > 138 138 138  K 3.7   < > 3.7 3.6 3.7  CL 103   < > 104 105 106  CO2 25   < > 25 27 23   GLUCOSE 119*   < > 126* 107* 91  BUN 22   < > 19 23 18   CREATININE 1.34*   < > 1.42* 1.61* 1.26*  CALCIUM 9.0   < > 8.7* 8.6* 8.8*  MG  --   --  2.1 2.2 2.1  PROT 6.8  --   --   --   --   ALBUMIN 3.9  --   --   --   --   AST 20  --   --   --   --   ALT 13  --   --   --   --   ALKPHOS 78  --   --   --   --   BILITOT 1.0  --   --   --   --   GFRNONAA 52*   < >  49* 42* 56*  ANIONGAP 10   < > 9 6 9    < > = values in this interval not displayed.    Lipids  Recent Labs  Lab 03/25/23 1145  CHOL 156  TRIG 75  HDL 45  LDLCALC 96  CHOLHDL 3.5    Hematology Recent Labs  Lab 03/28/23 0618 03/29/23 0241 03/30/23 0537  WBC 8.3 8.1 7.7  RBC 5.41 5.12 5.35  HGB 15.6 15.0 15.6  HCT 49.2 45.7 47.3  MCV 90.9 89.3 88.4  MCH 28.8 29.3 29.2  MCHC 31.7 32.8 33.0  RDW 14.7 14.6 14.6  PLT 148* 147* 167   Thyroid  Recent Labs  Lab 03/27/23 0945  TSH 4.606*    BNP Recent Labs  Lab 03/28/23 0618 03/29/23 0241 03/30/23 0537  BNP 752.5* 880.7* 791.3*    DDimer No results for input(s): "DDIMER" in the last 168 hours.   Radiology     MRI 03/25/23 1. 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 2. Normal expected interval evolution of previously identified subcentimeter infarct at the right temporoccipital junction. No associated hemorrhage or mass effect. 3. Underlying age-appropriate cerebral volume loss with moderate chronic microvascular ischemic disease, with a few scattered remote lacunar infarcts involving the hemispheric cerebral white matter, deep gray nuclei, and cerebellum.  CT CORONARY MORPH W/CTA COR W/SCORE W/CA W/CM &/OR WO/CM  Addendum Date: 03/29/2023    ADDENDUM REPORT: 03/29/2023 12:30 ADDENDUM: Elsewhere, there is a specific abnormal lymph node enlargement seen in the visualized chest including visualized axillary regions, hilum or mediastinum. Slightly patulous thoracic esophagus. Status post TAVR. The heart is slightly enlarged. Please correlate with separate portion of the dictation describing the cardiac sections. There is some irregular partially calcified plaque along the visualized portions of the thoracic aorta. There is some breathing motion seen on several series. Dependent linear opacity at the bases likely scar or atelectasis. No pleural effusion once again there are some tiny nodules identified under 5 mm. Some of these calcified such as right lung series 12, image 50. Noncalcified subpleural focus identified series 12, image 70 measuring 4 mm. Nodule along the left interlobar fissure measures 6 mm on series 12, image 35. This nodule is new. Few other nodules as well. Non-contrast chest CT at 3-6 months is recommended. If the nodules are stable at time of repeat CT, then future CT at 18-24 months (from today's scan) is considered optional for low-risk patients, but is recommended for high-risk patients. This recommendation follows the consensus statement: Guidelines for Management of Incidental Pulmonary Nodules Detected on CT Images: From the Fleischner Society 2017; Radiology 2017; 284:228-243. Diffuse degenerative changes along the spine with bridging syndesmophytes and osteophytes. Old posterior right-sided rib fracture. Electronically Signed   By: Karen Kays M.D.   On: 03/29/2023 12:30   Result Date: 03/29/2023 CLINICAL DATA:  S/p TAVR procedure 06/2022 with 26mm Edwards Sapien 3 valve. Increased gradients through valve noted on echo EXAM: Cardiac TAVR CT TECHNIQUE: The patient was scanned on a [employer]. A 120 kV retrospective scan was triggered in the descending thoracic aorta at 111 HU's. Gantry rotation speed was 250 msecs  and collimation was .6 mm. No beta blockade or nitro were given. The 3D data set was reconstructed in 5% intervals of the R-R cycle. Systolic and diastolic phases were analyzed on a dedicated work station using MPR, MIP and VRT modes. The patient received 100 cc of  contrast. FINDINGS: Aortic valve: S/p TAVR procedure with 26mm Edwards Sapien 3 valve. There is HALT/HAM of the leaflets of both the left and noncoronary cusps Right atrium: Moderate enlargement Right ventricle: Mild enlargement Pulmonary arteries: Dilated main pulmonary artery measuring 31mm Aorta: Dilated ascending aorta measuring 40mm. 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 Pulmonary veins: Normal configuration Left atrium: Mild enlargement Left ventricle: Normal size Pericardium: Normal thickness Coronary arteries: Normal origin IMPRESSION: 1. S/p TAVR procedure with 26mm Edwards Sapien 3 valve. There is HALT/HAM of the leaflets of both the left and noncoronary cusps 2. 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 On: 03/29/2023 11:43    Cardiac Studies   CTA 4/28 pending   TTE 03/29/23  1. Left ventricular ejection fraction, by estimation, is 70 to 75%. The  left ventricle has hyperdynamic function. Left ventricular endocardial  border not optimally defined to evaluate regional wall motion. There is  severe left ventricular hypertrophy.  Left ventricular diastolic parameters are indeterminate.   2. Right ventricular systolic function is normal. The right ventricular  size is normal. Tricuspid regurgitation signal is inadequate for assessing  PA pressure.   3. The mitral valve is normal in structure. No evidence of mitral valve  regurgitation. No evidence of mitral stenosis.   4. Edwards Sapien 3 Ultra Resilia THV (size 26 mm).  Operative note  07/14/2022 indicates after initial valve deployed significant paravalvular  leak, a second Edwards Sapien 3 ultra resilia THV 26 mm valve was then  also deployed which significantly reduced   paravalvular leak. The gradient across the prosthetic valves has  increased since 08/19/22 study(prior mean gradient , up to 30 mmHg on  this study). Consider TEE for further structural evaluation of prosthetic  valve. . The aortic valve was not well  visualized. Aortic valve regurgitation Trivial paravalvular leak.   5. The inferior vena cava is normal in size with greater than 50%  respiratory variability, suggesting right atrial pressure of 3 mmHg.   Patient Profile     82 y.o. 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  On Eliquis.  I have communicated with Structural heart team, will obtain repeat outpatient TTE in 6-8 weeks. 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 HTN:  BP elevated; mgmt per hospital medicine. HL:  Continue crestor T2DM:  Cont ASA, statin, losartan, consider SGLT2i.    Will sign off and arrange cardiology follow up.  Call with ?s.  For questions or updates, please contact [facility] Please consult [email or web address] for contact info under        Signed, Orbie Pyo, MD  03/30/2023, 2:09 PM

## 2023-03-30 NOTE — Progress Notes (Signed)
Speech Language Pathology Treatment: Cognitive-Linquistic  Patient Details Name: Ryan Duke MRN: [id number] DOB: May 08, 1941 Today's Date: 03/30/2023 Time: 4098-1191 SLP Time Calculation (min) (ACUTE ONLY): 25 min  Assessment / Plan / Recommendation Clinical Impression  Ryan Duke was pleasant and interactive. Requires max cueing (visual more helpful than verbal) to sustain attention to tasks- tends to drift off verbally and cease activity unless prompted to continue (answering questions, providing category info, brushing teeth, etc).  He required max visual/tactile cues to use his phone to find his photos, weather app, tending to tap items repetitively and needing cues to pause to wait for result.  Pt oriented to place, year, month, but not to situation.  Recall remains significantly impacted by attention deficits. SLP will follow while admitted.   HPI HPI: Pt is 82 year old male who presented to [facility] on  03/25/23 for AMS and frequent falls. Pt with recent rt temporal lobe infarct (outpt MRI 4/5). MRI on 4/25 showed patchy subcentimeter acute ischemic nonhemorrhagic infarcts involving the anterior right corona radiata/basal ganglia and contralateral subcortical left frontal lobe. Pt transferred to [facility] on 03/26/23.   PMH - HTN, DM, ckd, obesity, TAVR      SLP Plan  Continue with current plan of care      Recommendations for follow up therapy are one component of a multi-disciplinary discharge planning process, led by the attending physician.  Recommendations may be updated based on patient status, additional functional criteria and insurance authorization.    Recommendations      Pt will benefit from f/u therapy <3/hours per day.                   Frequent or constant Supervision/Assistance Cognitive communication deficit (R41.841)     Continue with current plan of care    Ryan Duke L. Samson Frederic, MA CCC/SLP Clinical Specialist - Acute Care SLP Acute Rehabilitation  Services Office number [phone]  Blenda Duke Ryan  03/30/2023, 10:42 AM

## 2023-03-30 NOTE — Care Management Important Message (Signed)
Important Message  Patient Details  Name: Ryan Duke MRN: [id number] Date of Birth: 05-04-1941   Medicare Important Message Given:  Yes     [name] 03/30/2023, 3:10 PM

## 2023-03-30 NOTE — TOC Progression Note (Signed)
Transition of Care [facility]) - Progression Note    Patient Details  Name: Ryan Duke MRN: [id number] Date of Birth: 1941-03-04  Transition of Care [facility]) CM/SW Contact  Mearl Latin, LCSW Phone Number: 03/30/2023, 2:10 PM  Clinical Narrative:    CSW submitted clinicals to insurance for review for [facility], Ref# [id number].   Expected Discharge Plan: Skilled Nursing Facility Barriers to Discharge: Continued Medical Work up, SNF Pending bed offer, English as a second language teacher  Expected Discharge Plan and Services In-house Referral: Clinical Social Work   Post Acute Care Choice: Skilled Nursing Facility Living arrangements for the past 2 months: Single Family Home                                       Social Determinants of Health (SDOH) Interventions SDOH Screenings   Food Insecurity: No Food Insecurity (03/02/2022)  Housing: Low Risk  (03/02/2022)  Transportation Needs: No Transportation Needs (03/02/2022)  Alcohol Screen: Low Risk  (03/02/2022)  Depression (PHQ2-9): Low Risk  (09/03/2022)  Financial Resource Strain: Low Risk  (03/02/2022)  Physical Activity: Insufficiently Active (03/02/2022)  Social Connections: Moderately Isolated (03/02/2022)  Stress: No Stress Concern Present (03/02/2022)  Tobacco Use: Medium Risk (03/25/2023)    Readmission Risk Interventions    07/15/2022   11:05 AM  Readmission Risk Prevention Plan  Post Dischage Appt Complete  Medication Screening Complete  Transportation Screening Complete

## 2023-03-31 ENCOUNTER — Other Ambulatory Visit (HOSPITAL_COMMUNITY): Payer: Self-pay

## 2023-03-31 DIAGNOSIS — E559 Vitamin D deficiency, unspecified: Secondary | ICD-10-CM | POA: Diagnosis not present

## 2023-03-31 DIAGNOSIS — J4520 Mild intermittent asthma, uncomplicated: Secondary | ICD-10-CM | POA: Diagnosis not present

## 2023-03-31 DIAGNOSIS — I10 Essential (primary) hypertension: Secondary | ICD-10-CM | POA: Diagnosis not present

## 2023-03-31 DIAGNOSIS — N1831 Chronic kidney disease, stage 3a: Secondary | ICD-10-CM | POA: Diagnosis not present

## 2023-03-31 DIAGNOSIS — Z952 Presence of prosthetic heart valve: Secondary | ICD-10-CM | POA: Diagnosis not present

## 2023-03-31 DIAGNOSIS — I639 Cerebral infarction, unspecified: Secondary | ICD-10-CM | POA: Diagnosis not present

## 2023-03-31 DIAGNOSIS — R270 Ataxia, unspecified: Secondary | ICD-10-CM | POA: Diagnosis not present

## 2023-03-31 DIAGNOSIS — R531 Weakness: Secondary | ICD-10-CM | POA: Diagnosis not present

## 2023-03-31 DIAGNOSIS — M5440 Lumbago with sciatica, unspecified side: Secondary | ICD-10-CM | POA: Diagnosis not present

## 2023-03-31 DIAGNOSIS — E1122 Type 2 diabetes mellitus with diabetic chronic kidney disease: Secondary | ICD-10-CM | POA: Diagnosis not present

## 2023-03-31 DIAGNOSIS — I358 Other nonrheumatic aortic valve disorders: Secondary | ICD-10-CM | POA: Diagnosis not present

## 2023-03-31 DIAGNOSIS — I739 Peripheral vascular disease, unspecified: Secondary | ICD-10-CM | POA: Diagnosis not present

## 2023-03-31 DIAGNOSIS — K219 Gastro-esophageal reflux disease without esophagitis: Secondary | ICD-10-CM | POA: Diagnosis not present

## 2023-03-31 DIAGNOSIS — I6340 Cerebral infarction due to embolism of unspecified cerebral artery: Secondary | ICD-10-CM | POA: Diagnosis not present

## 2023-03-31 DIAGNOSIS — M6281 Muscle weakness (generalized): Secondary | ICD-10-CM | POA: Diagnosis not present

## 2023-03-31 DIAGNOSIS — I350 Nonrheumatic aortic (valve) stenosis: Secondary | ICD-10-CM | POA: Diagnosis not present

## 2023-03-31 DIAGNOSIS — E119 Type 2 diabetes mellitus without complications: Secondary | ICD-10-CM | POA: Diagnosis not present

## 2023-03-31 DIAGNOSIS — L89152 Pressure ulcer of sacral region, stage 2: Secondary | ICD-10-CM | POA: Diagnosis not present

## 2023-03-31 DIAGNOSIS — I359 Nonrheumatic aortic valve disorder, unspecified: Secondary | ICD-10-CM | POA: Diagnosis not present

## 2023-03-31 DIAGNOSIS — R001 Bradycardia, unspecified: Secondary | ICD-10-CM | POA: Diagnosis not present

## 2023-03-31 DIAGNOSIS — E785 Hyperlipidemia, unspecified: Secondary | ICD-10-CM | POA: Diagnosis not present

## 2023-03-31 DIAGNOSIS — N3281 Overactive bladder: Secondary | ICD-10-CM | POA: Diagnosis not present

## 2023-03-31 DIAGNOSIS — R2689 Other abnormalities of gait and mobility: Secondary | ICD-10-CM | POA: Diagnosis not present

## 2023-03-31 DIAGNOSIS — N400 Enlarged prostate without lower urinary tract symptoms: Secondary | ICD-10-CM | POA: Diagnosis not present

## 2023-03-31 DIAGNOSIS — Z7401 Bed confinement status: Secondary | ICD-10-CM | POA: Diagnosis not present

## 2023-03-31 LAB — CBC WITH DIFFERENTIAL/PLATELET
Abs Immature Granulocytes: 0.04 10*3/uL (ref 0.00–0.07)
Basophils Absolute: 0.1 10*3/uL (ref 0.0–0.1)
Basophils Relative: 1 %
Eosinophils Absolute: 0.4 10*3/uL (ref 0.0–0.5)
Eosinophils Relative: 4 %
HCT: 45.6 % (ref 39.0–52.0)
Hemoglobin: 15.1 g/dL (ref 13.0–17.0)
Immature Granulocytes: 0 %
Lymphocytes Relative: 14 %
Lymphs Abs: 1.3 10*3/uL (ref 0.7–4.0)
MCH: 29.3 pg (ref 26.0–34.0)
MCHC: 33.1 g/dL (ref 30.0–36.0)
MCV: 88.4 fL (ref 80.0–100.0)
Monocytes Absolute: 0.9 10*3/uL (ref 0.1–1.0)
Monocytes Relative: 10 %
Neutro Abs: 6.7 10*3/uL (ref 1.7–7.7)
Neutrophils Relative %: 71 %
Platelets: 171 10*3/uL (ref 150–400)
RBC: 5.16 MIL/uL (ref 4.22–5.81)
RDW: 14.5 % (ref 11.5–15.5)
WBC: 9.4 10*3/uL (ref 4.0–10.5)
nRBC: 0 % (ref 0.0–0.2)

## 2023-03-31 LAB — BASIC METABOLIC PANEL
Anion gap: 6 (ref 5–15)
BUN: 22 mg/dL (ref 8–23)
CO2: 24 mmol/L (ref 22–32)
Calcium: 8.5 mg/dL — ABNORMAL LOW (ref 8.9–10.3)
Chloride: 107 mmol/L (ref 98–111)
Creatinine, Ser: 1.37 mg/dL — ABNORMAL HIGH (ref 0.61–1.24)
GFR, Estimated: 51 mL/min — ABNORMAL LOW (ref >60)
Glucose, Bld: 118 mg/dL — ABNORMAL HIGH (ref 70–99)
Potassium: 3.4 mmol/L — ABNORMAL LOW (ref 3.5–5.1)
Sodium: 137 mmol/L (ref 135–145)

## 2023-03-31 LAB — GLUCOSE, CAPILLARY
Glucose-Capillary: 111 mg/dL — ABNORMAL HIGH (ref 70–99)
Glucose-Capillary: 114 mg/dL — ABNORMAL HIGH (ref 70–99)
Glucose-Capillary: 134 mg/dL — ABNORMAL HIGH (ref 70–99)

## 2023-03-31 LAB — BRAIN NATRIURETIC PEPTIDE: B Natriuretic Peptide: 693 pg/mL — ABNORMAL HIGH (ref 0.0–100.0)

## 2023-03-31 LAB — MAGNESIUM: Magnesium: 2.1 mg/dL (ref 1.7–2.4)

## 2023-03-31 MED ORDER — ROSUVASTATIN CALCIUM 20 MG PO TABS: 20.0000 mg | ORAL_TABLET | Freq: Every day | ORAL | Status: AC

## 2023-03-31 MED ORDER — LANTUS SOLOSTAR 100 UNIT/ML ~~LOC~~ SOPN: 22.0000 [IU] | PEN_INJECTOR | Freq: Every day | SUBCUTANEOUS | 0 refills | Status: AC

## 2023-03-31 MED ORDER — APIXABAN 2.5 MG PO TABS: 2.5000 mg | ORAL_TABLET | Freq: Two times a day (BID) | ORAL | Status: DC

## 2023-03-31 MED ORDER — AMLODIPINE BESYLATE 10 MG PO TABS: 10.0000 mg | ORAL_TABLET | Freq: Every day | ORAL | Status: AC

## 2023-03-31 MED ORDER — INSULIN ASPART 100 UNIT/ML IJ SOLN: 0.0000 [IU] | Freq: Three times a day (TID) | INTRAMUSCULAR | 11 refills | Status: AC

## 2023-03-31 MED ORDER — SENNOSIDES-DOCUSATE SODIUM 8.6-50 MG PO TABS: 1.0000 | ORAL_TABLET | Freq: Every evening | ORAL | Status: AC | PRN

## 2023-03-31 MED ORDER — POTASSIUM CHLORIDE CRYS ER 20 MEQ PO TBCR
40.0000 meq | EXTENDED_RELEASE_TABLET | Freq: Once | ORAL | Status: AC
  Administered 2023-03-31: 40 meq via ORAL
  Filled 2023-03-31: qty 2

## 2023-03-31 MED ORDER — APIXABAN 2.5 MG PO TABS
2.5000 mg | ORAL_TABLET | Freq: Two times a day (BID) | ORAL | 0 refills | Status: AC
  Filled 2023-03-31: qty 60, 30d supply, fill #0

## 2023-03-31 NOTE — Discharge Summary (Signed)
Physician Discharge Summary  Ryan Duke Wik ZOX:[id number] DOB: [birth year] DOA: 03/25/2023  PCP: [name], MD  Admit date: 03/25/2023 Discharge date: 03/31/2023  Admitted From: (Home) Disposition:  (SNF)  Recommendations for Outpatient Follow-up:  To follow with neurology as an outpatient, ambulatory referral has been sent   Discharge Condition: (Stable) CODE STATUS: (DNR Diet recommendation: Heart Healthy / Carb Modified   Brief/Interim Summary:   82 years old Male with PMH significant for insulin-dependent type 2 diabetes, CKD stage IIIa, severe aortic stenosis s/p TAVR, hypertension, hyperlipidemia, BPH presented to the ED for the evaluation of recurrent falls.  Patient reports recurrent fall for last 1 month,  states he lost his balance when walking resulting in falls.  Patient did have an outpatient MRI which showed 4 mm acute subcortical nonhemorrhagic white matter infarct in the posterior right temporal lobe.  In the ER workup again consistent with acute hemic stroke, he was seen by neurology team, stroke workup being done subsequently he has developed severe encephalopathy as well.  Currently mentation much improved, back to baseline, plan to DC to SNF.     Acute ischemic nonhemorrhagic infarcts involving the anterior right corona radiata/basal ganglia and contralateral subcortical left frontal lobe.  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  Neurology input greatly appreciated, started on Eliquis.  Neurology input greatly appreciated, they have communicated with Structural heart team, and will arrange for repeat outpatient TTE in 6-8 weeks.    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   Dyslipidemia.  Switch  to Crestor for better control.   Hypertension.  On Norvasc   CKD 3A.  Creatinine appears to be at baseline of 1.6.   Severe aortic stenosis s/p TAVR.  Supportive care for now, TTE noted, CT scan confirms TAVR site clot now on Eliquis, seen by cardiology.   Multiple falls at home.  Likely due to combination of #1 and 2 above, CT head, CTA head and neck no acute fractures or bleed.  PT, OT and SNF.   Low NML B12 - PO B12   DM type II.  Ental status has been adjusted, continue with insulin sliding scale and Farxiga   Discharge Diagnoses:  Principal Problem:   Acute CVA (cerebrovascular accident) (HCC) Active Problems:   Hyperlipidemia with target LDL less than 100   Essential hypertension   Chronic kidney disease, stage 3a (HCC)   Insulin-requiring or dependent type II diabetes mellitus (HCC)   CVA (cerebral vascular accident) [facility])    Discharge Instructions  Discharge Instructions     Ambulatory referral to Neurology   Complete by: As directed    Follow up with stroke clinic NP (Jessica Vanschaick or Darrol Angel, if both not available, consider Manson Allan, or Ahern) at [facility] in about 4 weeks. Thanks.   Diet - low sodium heart healthy   Complete by: As directed    Discharge instructions   Complete by: As directed    Follow with Primary MD [name], MD/SNF physician  Get CBC, CMP,  checked  by Primary MD next visit.    Activity: As tolerated with Full fall precautions use walker/cane & assistance as needed   Disposition SNF   Diet: Heart Healthy /carb modified   On your next visit  with your primary care physician please Get Medicines reviewed and adjusted.   Please request your Prim.MD to go over all Hospital Tests and Procedure/Radiological results at the follow up, please get all Hospital records sent to your Prim MD by signing hospital release before you go home.   If you experience worsening of your admission symptoms, develop shortness of  breath, life threatening emergency, suicidal or homicidal thoughts you must seek medical attention immediately by calling 911 or calling your MD immediately  if symptoms less severe.  You Must read complete instructions/literature along with all the possible adverse reactions/side effects for all the Medicines you take and that have been prescribed to you. Take any new Medicines after you have completely understood and accpet all the possible adverse reactions/side effects.   Do not drive, operating heavy machinery, perform activities at heights, swimming or participation in water activities or provide baby sitting services if your were admitted for syncope or siezures until you have seen by Primary MD or a Neurologist and advised to do so again.  Do not drive when taking Pain medications.    Do not take more than prescribed Pain, Sleep and Anxiety Medications  Special Instructions: If you have smoked or chewed Tobacco  in the last 2 yrs please stop smoking, stop any regular Alcohol  and or any Recreational drug use.  Wear Seat belts while driving.   Please note  You were cared for by a hospitalist during your hospital stay. If you have any questions about your discharge medications or the care you received while you were in the hospital after you are discharged, you can call the unit and asked to speak with the hospitalist on call if the hospitalist that took care of you is not available. Once you are discharged, your primary care physician will handle any further medical issues. Please note that NO REFILLS for any discharge medications will be authorized once you are discharged, as it is imperative that you return to your primary care physician (or establish a relationship with a primary care physician if you do not have one) for your aftercare needs so that they can reassess your need for medications and monitor your lab values.   Increase activity slowly   Complete by: As directed    No  wound care   Complete by: As directed       Allergies as of 03/31/2023       Reactions   Farxiga [dapagliflozin] Other (See Comments)   Gout   Metformin And Related Other (See Comments)   "Acidosis"   Atorvastatin Other (See Comments)   Headaches   Benazepril Hcl Other (See Comments)   Was not effective   Flexeril [cyclobenzaprine] Other (See Comments)   Hallucinations    Oxycodone Other (See Comments)   Hallucinations         Medication List     STOP taking these medications    amoxicillin 500 MG tablet Commonly known as: AMOXIL   aspirin 81 MG chewable tablet   FreeStyle Libre 2 Reader [employer] 2 Sensor Misc   NovoFine 32G X 6 MM Misc Generic drug: Insulin Pen Needle   pravastatin 40 MG tablet Commonly known as: PRAVACHOL   pregabalin 150 MG capsule Commonly known as: LYRICA       TAKE these medications    acetaminophen 325 MG tablet Commonly known as: TYLENOL Take 650 mg by mouth every 6 (six) hours as needed for mild pain or moderate  pain (Uses as needed).   amLODipine 10 MG tablet Commonly known as: NORVASC Take 1 tablet (10 mg total) by mouth daily. Start taking on: Apr 01, 2023   apixaban 2.5 MG Tabs tablet Commonly known as: ELIQUIS Take 1 tablet (2.5 mg total) by mouth 2 (two) times daily.   atenolol 50 MG tablet Commonly known as: TENORMIN Take 1 tablet (50 mg total) by mouth daily.   cyanocobalamin 500 MCG tablet Commonly known as: VITAMIN B12 Take 500 mcg by mouth daily.   DULoxetine 60 MG capsule Commonly known as: CYMBALTA TAKE ONE CAPSULE DAILY   fluticasone 50 MCG/ACT nasal spray Commonly known as: FLONASE Place 1 spray into both nostrils daily as needed for allergies or rhinitis.   insulin aspart 100 UNIT/ML injection Commonly known as: novoLOG Inject 0-9 Units into the skin 3 (three) times daily with meals.   Jardiance 25 MG Tabs tablet Generic drug: empagliflozin TAKE ONE TABLET BY MOUTH ONCE DAILY  BEFORE BREAKFAST   Kerendia 20 MG Tabs Generic drug: Finerenone Take 1 tablet by mouth daily.   Lantus SoloStar 100 UNIT/ML Solostar Pen Generic drug: insulin glargine Inject 22 Units into the skin daily. What changed: how much to take   losartan 100 MG tablet Commonly known as: COZAAR Take 1 tablet (100 mg total) by mouth daily for high blood pressure   multivitamin with minerals Tabs tablet Take 1 tablet by mouth daily.   PRESERVISION AREDS PO Take 1 tablet by mouth in the morning and at bedtime.   rosuvastatin 20 MG tablet Commonly known as: CRESTOR Take 1 tablet (20 mg total) by mouth daily. Start taking on: Apr 01, 2023   senna-docusate 8.6-50 MG tablet Commonly known as: Senokot-S Take 1 tablet by mouth at bedtime as needed for mild constipation.   tamsulosin 0.4 MG Caps capsule Commonly known as: FLOMAX TAKE ONE CAPSULE BY MOUTH DAILY        Contact information for follow-up providers     [facility]. Schedule an appointment as soon as possible for a visit in 1 month(s).   Specialty: Neurology Why: stroke clinic Contact information: [address] [place] [address] [phone]             Contact information for after-discharge care     Destination     [facility] .   Service: Skilled Nursing Contact information: [address] Test Update Address [place] [address] [phone]                    Allergies  Allergen Reactions   Marcelline Deist [Dapagliflozin] Other (See Comments)    Gout    Metformin And Related Other (See Comments)    "Acidosis"   Atorvastatin Other (See Comments)    Headaches    Benazepril Hcl Other (See Comments)    Was not effective   Flexeril [Cyclobenzaprine] Other (See Comments)    Hallucinations    Oxycodone Other (See Comments)    Hallucinations     Consultations: Neurology   cardiology    Procedures/Studies: CT CORONARY MORPH W/CTA COR W/SCORE W/CA W/CM &/OR WO/CM  Addendum Date: 03/29/2023   ADDENDUM REPORT: 03/29/2023 12:30 ADDENDUM: Elsewhere, there is a specific abnormal lymph node enlargement seen in the visualized chest including visualized axillary regions, hilum or mediastinum. Slightly patulous thoracic esophagus. Status post TAVR. The heart is slightly enlarged. Please correlate with separate portion of the dictation describing the cardiac sections. There  is some irregular partially calcified plaque along the visualized portions of the thoracic aorta. There is some breathing motion seen on several series. Dependent linear opacity at the bases likely scar or atelectasis. No pleural effusion once again there are some tiny nodules identified under 5 mm. Some of these calcified such as right lung series 12, image 50. Noncalcified subpleural focus identified series 12, image 70 measuring 4 mm. Nodule along the left interlobar fissure measures 6 mm on series 12, image 35. This nodule is new. Few other nodules as well. Non-contrast chest CT at 3-6 months is recommended. If the nodules are stable at time of repeat CT, then future CT at 18-24 months (from today's scan) is considered optional for low-risk patients, but is recommended for high-risk patients. This recommendation follows the consensus statement: Guidelines for Management of Incidental Pulmonary Nodules Detected on CT Images: From the Fleischner Society 2017; Radiology 2017; 284:228-243. Diffuse degenerative changes along the spine with bridging syndesmophytes and osteophytes. Old posterior right-sided rib fracture. Electronically Signed   By: Karen Kays M.D.   On: 03/29/2023 12:30   Result Date: 03/29/2023 CLINICAL DATA:  S/p TAVR procedure 06/2022 with 26mm Edwards Sapien 3 valve. Increased gradients through valve noted on echo EXAM: Cardiac TAVR CT TECHNIQUE: The patient was scanned on a [employer].  A 120 kV retrospective scan was triggered in the descending thoracic aorta at 111 HU's. Gantry rotation speed was 250 msecs and collimation was .6 mm. No beta blockade or nitro were given. The 3D data set was reconstructed in 5% intervals of the R-R cycle. Systolic and diastolic phases were analyzed on a dedicated work station using MPR, MIP and VRT modes. The patient received 100 cc of contrast. FINDINGS: Aortic valve: S/p TAVR procedure with 26mm Edwards Sapien 3 valve. There is HALT/HAM of the leaflets of both the left and noncoronary cusps Right atrium: Moderate enlargement Right ventricle: Mild enlargement Pulmonary arteries: Dilated main pulmonary artery measuring 31mm Aorta: Dilated ascending aorta measuring 40mm. 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 Pulmonary veins: Normal configuration Left atrium: Mild enlargement Left ventricle: Normal size Pericardium: Normal thickness Coronary arteries: Normal origin IMPRESSION: 1. S/p TAVR procedure with 26mm Edwards Sapien 3 valve. There is HALT/HAM of the leaflets of both the left and noncoronary cusps 2. 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 On: 03/29/2023 11:43   DG Chest Port 1 View  Result Date: 03/28/2023 CLINICAL DATA:  Shortness of breath EXAM: PORTABLE CHEST 1 VIEW COMPARISON:  10/19/2022 FINDINGS: Normal heart size. Transcatheter aortic valve replacement. Chronic blunting at the lateral left costophrenic sulcus which is from fat by most recent CT. There is no edema, consolidation, effusion, or pneumothorax. IMPRESSION: No acute finding when compared to prior. Electronically Signed   By: Tiburcio Pea M.D.   On: 03/28/2023 06:19   ECHOCARDIOGRAM COMPLETE  Result Date: 03/27/2023    ECHOCARDIOGRAM REPORT   Patient Name:   Ryan Duke  Bentz Date of Exam: 03/26/2023 Medical Rec #:  [id number]       Height:       71.0 in Accession #:    [id number]      Weight:       225.0 lb Date of Birth:  Nov 28, 1941       BSA:  2.217 m Patient Age:    82 years        BP:           180/78 mmHg Patient Gender: M               HR:           84 bpm. Exam Location:  Inpatient Procedure: 2D Echo, Cardiac Doppler and Color Doppler Indications:    stroke  History:        Patient has prior history of Echocardiogram examinations. Risk                 Factors:Diabetes, Dyslipidemia and Hypertension.  Sonographer:    Mike Gip Referring Phys: [id number] [facility] IMPRESSIONS  1. Left ventricular ejection fraction, by estimation, is 70 to 75%. The left ventricle has hyperdynamic function. Left ventricular endocardial border not optimally defined to evaluate regional wall motion. There is severe left ventricular hypertrophy. Left ventricular diastolic parameters are indeterminate.  2. Right ventricular systolic function is normal. The right ventricular size is normal. Tricuspid regurgitation signal is inadequate for assessing PA pressure.  3. The mitral valve is normal in structure. No evidence of mitral valve regurgitation. No evidence of mitral stenosis.  4. Edwards Sapien 3 Ultra Resilia THV (size 26 mm). Operative note 07/14/2022 indicates after initial valve deployed significant paravalvular leak, a second Edwards Sapien 3 ultra resilia THV 26 mm valve was then also deployed which significantly reduced  paravalvular leak. The gradient across the prosthetic valves has increased since 08/19/22 study(prior mean gradient , up to 30 mmHg on this study). Consider TEE for further structural evaluation of prosthetic valve. . The aortic valve was not well visualized. Aortic valve regurgitation Trivial paravalvular leak.  5. The inferior vena cava is normal in size with greater than 50% respiratory variability, suggesting right atrial pressure of 3 mmHg. FINDINGS   Left Ventricle: Left ventricular ejection fraction, by estimation, is 70 to 75%. The left ventricle has hyperdynamic function. Left ventricular endocardial border not optimally defined to evaluate regional wall motion. The left ventricular internal cavity size was normal in size. There is severe left ventricular hypertrophy. Left ventricular diastolic parameters are indeterminate. Right Ventricle: The right ventricular size is normal. Right vetricular wall thickness was not well visualized. Right ventricular systolic function is normal. Tricuspid regurgitation signal is inadequate for assessing PA pressure. Left Atrium: Left atrial size was normal in size. Right Atrium: Right atrial size was normal in size. Pericardium: There is no evidence of pericardial effusion. Mitral Valve: The mitral valve is normal in structure. No evidence of mitral valve regurgitation. No evidence of mitral valve stenosis. Tricuspid Valve: The tricuspid valve is normal in structure. Tricuspid valve regurgitation is not demonstrated. No evidence of tricuspid stenosis. Aortic Valve: Edwards Sapien 3 Ultra Resilia THV (size 26 mm). Operative note 07/14/2022 indicates after initial valve deployed significant paravalvular leak, a second Edwards Sapien 3 ultra resilia THV 26 mm valve was then also deployed which significantly reduced paravalvular leak. The gradient across the prosthetic valves has increased since 08/19/22 study(prior mean gradient , up to 30 mmHg on this study). Consider TEE for further structural evaluation of prosthetic valve. The aortic valve was not well visualized. Aortic valve regurgitation Trivial paravalvular leak. Aortic valve mean gradient measures 30.0 mmHg. Aortic valve peak gradient measures 55.1 mmHg. Aortic valve area, by VTI measures 1.55 cm. Pulmonic Valve: The pulmonic valve was not well visualized. Pulmonic valve regurgitation is not visualized. No evidence of pulmonic  stenosis. Aorta: The aortic root is  normal in size and structure. Venous: The inferior vena cava is normal in size with greater than 50% respiratory variability, suggesting right atrial pressure of 3 mmHg. IAS/Shunts: No atrial level shunt detected by color flow Doppler.  LEFT VENTRICLE PLAX 2D LVIDd:         3.90 cm      Diastology LVIDs:         2.20 cm      LV e' medial:    4.03 cm/s LV PW:         2.10 cm      LV E/e' medial:  10.0 LV IVS:        2.20 cm      LV e' lateral:   4.57 cm/s LVOT diam:     2.10 cm      LV E/e' lateral: 8.9 LV SV:         115 LV SV Index:   52 LVOT Area:     3.46 cm  LV Volumes (MOD) LV vol d, MOD A2C: 101.0 ml LV vol d, MOD A4C: 104.0 ml LV vol s, MOD A2C: 31.0 ml LV vol s, MOD A4C: 26.3 ml LV SV MOD A2C:     70.0 ml LV SV MOD A4C:     104.0 ml LV SV MOD BP:      76.6 ml RIGHT VENTRICLE             IVC RV Basal diam:  4.40 cm     IVC diam: 1.90 cm RV S prime:     13.90 cm/s TAPSE (M-mode): 2.4 cm LEFT ATRIUM             Index        RIGHT ATRIUM           Index LA diam:        3.60 cm 1.62 cm/m   RA Area:     25.40 cm LA Vol (A2C):   83.1 ml 37.49 ml/m  RA Volume:   75.80 ml  34.19 ml/m LA Vol (A4C):   42.4 ml 19.13 ml/m LA Biplane Vol: 59.4 ml 26.80 ml/m  AORTIC VALVE AV Area (Vmax):    1.19 cm AV Area (Vmean):   1.32 cm AV Area (VTI):     1.55 cm AV Vmax:           371.00 cm/s AV Vmean:          256.000 cm/s AV VTI:            0.738 m AV Peak Grad:      55.1 mmHg AV Mean Grad:      30.0 mmHg LVOT Vmax:         128.00 cm/s LVOT Vmean:        97.800 cm/s LVOT VTI:          0.331 m LVOT/AV VTI ratio: 0.45  AORTA Ao Root diam: 3.90 cm Ao Asc diam:  3.10 cm MITRAL VALVE MV Area (PHT): 2.33 cm     SHUNTS MV Decel Time: 326 msec     Systemic VTI:  0.33 m MV E velocity: 40.50 cm/s   Systemic Diam: 2.10 cm MV A velocity: 104.00 cm/s MV E/A ratio:  0.39 Dina Rich MD Electronically signed by Dina Rich MD Signature Date/Time: 03/27/2023/1:59:42 PM    Final    VAS [place] LOWER EXTREMITY VENOUS (DVT)  Result  Date: 03/27/2023  Lower Venous DVT Study  Patient Name:  Ryan Duke Vanburen  Date of Exam:   03/27/2023 Medical Rec #: [id number]        Accession #:    [id number] Date of Birth: Apr 20, 1941        Patient Gender: M Patient Age:   48 years Exam [facility]  [facility] Procedure:      VAS [place] LOWER EXTREMITY VENOUS (DVT) Referring Phys: Scheryl Marten XU --------------------------------------------------------------------------------  Indications: Stroke.  Limitations: Poor ultrasound/tissue interface. Comparison Study: Previous exam on 09/06/2017 was negative for DVT Performing Technologist: Ernestene Mention RVT, RDMS  Examination Guidelines: A complete evaluation includes B-mode imaging, spectral Doppler, color Doppler, and power Doppler as needed of all accessible portions of each vessel. Bilateral testing is considered an integral part of a complete examination. Limited examinations for reoccurring indications may be performed as noted. The reflux portion of the exam is performed with the patient in reverse Trendelenburg.  +---------+---------------+---------+-----------+----------+--------------+ RIGHT    CompressibilityPhasicitySpontaneityPropertiesThrombus Aging +---------+---------------+---------+-----------+----------+--------------+ CFV      Full           Yes      Yes                                 +---------+---------------+---------+-----------+----------+--------------+ SFJ      Full                                                        +---------+---------------+---------+-----------+----------+--------------+ FV Prox  Full           Yes      Yes                                 +---------+---------------+---------+-----------+----------+--------------+ FV Mid   Full           No       Yes        pulsatile                +---------+---------------+---------+-----------+----------+--------------+ FV DistalFull           No       Yes        pulsatile                 +---------+---------------+---------+-----------+----------+--------------+ PFV      Full                                                        +---------+---------------+---------+-----------+----------+--------------+ POP      Full           No       Yes        pulsatile                +---------+---------------+---------+-----------+----------+--------------+ PTV      Full                                                        +---------+---------------+---------+-----------+----------+--------------+  PERO     Full                                                        +---------+---------------+---------+-----------+----------+--------------+   +---------+---------------+---------+-----------+----------+-------------------+ LEFT     CompressibilityPhasicitySpontaneityPropertiesThrombus Aging      +---------+---------------+---------+-----------+----------+-------------------+ CFV      Full           Yes      Yes                                      +---------+---------------+---------+-----------+----------+-------------------+ SFJ      Full                                                             +---------+---------------+---------+-----------+----------+-------------------+ FV Prox  Full           Yes      Yes                                      +---------+---------------+---------+-----------+----------+-------------------+ FV Mid   Full           Yes      Yes                                      +---------+---------------+---------+-----------+----------+-------------------+ FV DistalFull           Yes      Yes                                      +---------+---------------+---------+-----------+----------+-------------------+ PFV      Full                                                             +---------+---------------+---------+-----------+----------+-------------------+ POP      Full           Yes      Yes                                       +---------+---------------+---------+-----------+----------+-------------------+ PTV                                                   Not well visualized +---------+---------------+---------+-----------+----------+-------------------+ PERO  Not well visualized +---------+---------------+---------+-----------+----------+-------------------+     Summary: BILATERAL: - No evidence of deep vein thrombosis seen in the lower extremities, bilaterally. -No evidence of popliteal cyst, bilaterally.   *See table(s) above for measurements and observations. Electronically signed by Heath Lark on 03/27/2023 at 1:35:09 PM.    Final    EEG adult  Result Date: 03/26/2023 Charlsie Quest, MD     03/26/2023 12:04 PM Patient Name: Ryan Duke MRN: [id number] Epilepsy Attending: Charlsie Quest Referring Physician/Provider: Gordy Councilman, MD Date: 03/26/2023 Duration: 22.32 mins Patient history: 82yo M getting eeg to evaluate for seizure. Level of alertness: Awake AEDs during EEG study: None Technical aspects: This EEG study was done with scalp electrodes positioned according to the 10-20 International system of electrode placement. Electrical activity was reviewed with band pass filter of 1-70Hz , sensitivity of 7 uV/mm, display speed of 77mm/sec with a 60Hz  notched filter applied as appropriate. EEG data were recorded continuously and digitally stored.  Video monitoring was available and reviewed as appropriate. Description: The posterior dominant rhythm consists of 7 Hz activity of moderate voltage (25-35 uV) seen predominantly in posterior head regions, symmetric and reactive to eye opening and eye closing. EEG showed continuous generalized 5 to 7 Hz theta slowing admixed with intermittent 2-3Hz  delta slowing. Hyperventilation and photic stimulation were not performed.   Of note, eeg was technically difficult due to  significant electrical artifact. ABNORMALITY - Continuous slow, generalized - Background slow IMPRESSION: This technically difficult study is suggestive of moderate diffuse encephalopathy. No seizures or epileptiform discharges were seen throughout the recording. Charlsie Quest   CT Angio Head W or Wo Contrast  Result Date: 03/25/2023 CLINICAL DATA:  Initial evaluation for stroke. EXAM: CT ANGIOGRAPHY HEAD AND NECK TECHNIQUE: Multidetector CT imaging of the head and neck was performed using the standard protocol during bolus administration of intravenous contrast. Multiplanar CT image reconstructions and MIPs were obtained to evaluate the vascular anatomy. Carotid stenosis measurements (when applicable) are obtained utilizing NASCET criteria, using the distal internal carotid diameter as the denominator. RADIATION DOSE REDUCTION: This exam was performed according to the departmental dose-optimization program which includes automated exposure control, adjustment of the mA and/or kV according to patient size and/or use of iterative reconstruction technique. CONTRAST:  OMNIPAQUE IOHEXOL 350 MG/ML SOLN COMPARISON:  MRI and CT from earlier the same day. FINDINGS: CTA NECK FINDINGS Aortic arch: Visualized aortic arch normal caliber with standard branch pattern. Moderate atheromatous change about the arch itself, with irregular soft plaque/thrombus protruding into the aortic lumen (series 504, image 23). Right carotid system: Right common and internal carotid arteries are patent without stenosis or dissection. Left carotid system: Left common and internal carotid arteries are patent without stenosis or dissection. Vertebral arteries: Both vertebral arteries arise from the subclavian arteries. No proximal subclavian artery stenosis. Mild atheromatous irregularity about the vertebral arteries without high-grade stenosis or dissection. Skeleton: no discrete or worrisome osseous lesions. Moderate degenerative  spondylosis at C4-5 through C6-7. Other neck: No other acute soft tissue abnormality within the neck. Upper chest: Visualized upper chest demonstrates no acute finding. Review of the MIP images confirms the above findings CTA HEAD FINDINGS Anterior circulation: Mild atheromatous change within the carotid siphons without stenosis. A1 segments, anterior communicating complex common anterior cerebral arteries widely patent. No M1 stenosis or occlusion. No proximal MCA branch occlusion or stenosis. Distal MCA branches perfused and symmetric. Posterior circulation: Both V4 segments patent without stenosis. Both PICA patent. Short-segment fenestration at  the vertebrobasilar junction noted. Basilar diminutive but widely patent. Superior cerebral arteries patent bilaterally. Predominant fetal type origin of the PCAs. Both PCAs widely patent to their distal aspects without stenosis. Venous sinuses: Grossly patent allowing for timing the contrast bolus. Anatomic variants: As above.  No aneurysm. Review of the MIP images confirms the above findings IMPRESSION: 1. Negative CTA for large vessel occlusion or other emergent finding. 2. Mild atheromatous disease for age without hemodynamically significant or correctable stenosis. Aortic Atherosclerosis (ICD10-I70.0). Electronically Signed   By: Rise Mu M.D.   On: 03/25/2023 22:09   CT Angio Neck W and/or Wo Contrast  Result Date: 03/25/2023 CLINICAL DATA:  Initial evaluation for stroke. EXAM: CT ANGIOGRAPHY HEAD AND NECK TECHNIQUE: Multidetector CT imaging of the head and neck was performed using the standard protocol during bolus administration of intravenous contrast. Multiplanar CT image reconstructions and MIPs were obtained to evaluate the vascular anatomy. Carotid stenosis measurements (when applicable) are obtained utilizing NASCET criteria, using the distal internal carotid diameter as the denominator. RADIATION DOSE REDUCTION: This exam was performed  according to the departmental dose-optimization program which includes automated exposure control, adjustment of the mA and/or kV according to patient size and/or use of iterative reconstruction technique. CONTRAST:  OMNIPAQUE IOHEXOL 350 MG/ML SOLN COMPARISON:  MRI and CT from earlier the same day. FINDINGS: CTA NECK FINDINGS Aortic arch: Visualized aortic arch normal caliber with standard branch pattern. Moderate atheromatous change about the arch itself, with irregular soft plaque/thrombus protruding into the aortic lumen (series 504, image 23). Right carotid system: Right common and internal carotid arteries are patent without stenosis or dissection. Left carotid system: Left common and internal carotid arteries are patent without stenosis or dissection. Vertebral arteries: Both vertebral arteries arise from the subclavian arteries. No proximal subclavian artery stenosis. Mild atheromatous irregularity about the vertebral arteries without high-grade stenosis or dissection. Skeleton: no discrete or worrisome osseous lesions. Moderate degenerative spondylosis at C4-5 through C6-7. Other neck: No other acute soft tissue abnormality within the neck. Upper chest: Visualized upper chest demonstrates no acute finding. Review of the MIP images confirms the above findings CTA HEAD FINDINGS Anterior circulation: Mild atheromatous change within the carotid siphons without stenosis. A1 segments, anterior communicating complex common anterior cerebral arteries widely patent. No M1 stenosis or occlusion. No proximal MCA branch occlusion or stenosis. Distal MCA branches perfused and symmetric. Posterior circulation: Both V4 segments patent without stenosis. Both PICA patent. 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 Basilar diminutive but widely patent. Superior cerebral arteries patent bilaterally. Predominant fetal type origin of the PCAs. Both PCAs widely patent to their distal aspects without  stenosis. Venous sinuses: Grossly patent allowing for timing the contrast bolus. Anatomic variants: As above.  No aneurysm. Review of the MIP images confirms the above findings IMPRESSION: 1. Negative CTA for large vessel occlusion or other emergent finding. 2. Mild atheromatous disease for age without hemodynamically significant or correctable stenosis. Aortic Atherosclerosis (ICD10-I70.0). Electronically Signed   By: Rise Mu M.D.   On: 03/25/2023 22:09   MR BRAIN WO CONTRAST  Result Date: 03/25/2023 CLINICAL DATA:  Initial evaluation for word-finding difficulty, recent stroke. EXAM: MRI HEAD WITHOUT CONTRAST TECHNIQUE: Multiplanar, multiecho pulse sequences of the brain and surrounding structures were obtained without intravenous contrast. COMPARISON:  CT from earlier same day as well as prior MRI from 03/05/2023. FINDINGS: Brain: Age-appropriate cerebral volume loss. Patchy T2/FLAIR hyperintensity involving the periventricular deep white matter both cerebral hemispheres, consistent with moderate chronic microvascular ischemic  disease. Few scatter remote lacunar infarcts present about the hemispheric cerebral white matter and deep gray nuclei. Few scattered small remote bilateral cerebellar infarcts noted. Patchy subcentimeter acute ischemic nonhemorrhagic infarct present at the anterior right frontal corona radiata/basal ganglia (series 9, image 32). Additional subcentimeter acute ischemic nonhemorrhagic subcortical infarct noted at the contralateral left frontal lobe (series 9, image 39). Normal expected interval evolution of previously identified subcentimeter infarct at the right temporal occipital junction (series 9, image 23). No associated hemorrhage or mass effect. No other evidence for acute or subacute ischemia. No acute or chronic intracranial blood products. No mass lesion, mass effect or midline shift. No hydrocephalus or extra-axial fluid collection. Pituitary gland and suprasellar  region within normal limits. Vascular: Major intracranial vascular flow voids are maintained. Skull and upper cervical spine: Craniocervical junction within normal limits. Bone marrow signal intensity normal. No scalp soft tissue abnormality. Sinuses/Orbits: Prior bilateral ocular lens replacement. Paranasal sinuses are largely clear. No significant mastoid effusion. Other: None. IMPRESSION: 1. 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 2. Normal expected interval evolution of previously identified subcentimeter infarct at the right temporoccipital junction. No associated hemorrhage or mass effect. 3. Underlying age-appropriate cerebral volume loss with moderate chronic microvascular ischemic disease, with a few scattered remote lacunar infarcts involving the hemispheric cerebral white matter, deep gray nuclei, and cerebellum. Electronically Signed   By: Rise Mu M.D.   On: 03/25/2023 18:19   CT HEAD WO CONTRAST  Result Date: 03/25/2023 CLINICAL DATA:  Fall EXAM: CT HEAD WITHOUT CONTRAST CT CERVICAL SPINE WITHOUT CONTRAST TECHNIQUE: Multidetector CT imaging of the head and cervical spine was performed following the standard protocol without intravenous contrast. Multiplanar CT image reconstructions of the cervical spine were also generated. RADIATION DOSE REDUCTION: This exam was performed according to the departmental dose-optimization program which includes automated exposure control, adjustment of the mA and/or kV according to patient size and/or use of iterative reconstruction technique. COMPARISON:  None Available. FINDINGS: CT HEAD FINDINGS Brain: No evidence of acute infarction, hemorrhage, hydrocephalus, extra-axial collection or mass lesion/mass effect. There is sequela of moderate severe chronic microvascular ischemic change. Vascular: No hyperdense vessel or  unexpected calcification. Skull: Normal. Negative for fracture or focal lesion. Sinuses/Orbits: No middle ear or mastoid effusion. Paranasal sinuses clear. Bilateral lens replacement. Orbits are otherwise unremarkable Other: None. CT CERVICAL SPINE FINDINGS Alignment: There is straightening of the normal cervical lordosis. Skull base and vertebrae: No acute fracture. No primary bone lesion or focal pathologic process. Soft tissues and spinal canal: No prevertebral fluid or swelling. No visible canal hematoma. 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 Other: None IMPRESSION: 1. No CT evidence of intracranial injury. Sequela of moderate to severe chronic microvascular ischemic change. 2. No acute fracture or traumatic malalignment of the cervical spine. 3. 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 Electronically Signed   By: Lorenza Cambridge M.D.   On: 03/25/2023 13:04   CT Cervical Spine Wo Contrast  Result Date: 03/25/2023 CLINICAL DATA:  Fall EXAM: CT HEAD WITHOUT CONTRAST CT CERVICAL SPINE WITHOUT CONTRAST TECHNIQUE: Multidetector CT imaging of the head and cervical spine was performed following the standard protocol without intravenous contrast. Multiplanar CT image reconstructions of the cervical spine were also generated. RADIATION DOSE REDUCTION: This exam was performed according to the departmental dose-optimization program which includes automated exposure control, adjustment of the mA and/or kV according to patient  size and/or use of iterative reconstruction technique. COMPARISON:  None Available. FINDINGS: CT HEAD FINDINGS Brain: No evidence of acute infarction, hemorrhage, hydrocephalus, extra-axial collection or mass lesion/mass effect. There is sequela of moderate severe chronic microvascular ischemic change. Vascular: No hyperdense vessel or unexpected calcification. Skull: Normal. Negative for fracture or focal lesion. Sinuses/Orbits: No middle ear or  mastoid effusion. Paranasal sinuses clear. Bilateral lens replacement. Orbits are otherwise unremarkable Other: None. CT CERVICAL SPINE FINDINGS Alignment: There is straightening of the normal cervical lordosis. Skull base and vertebrae: No acute fracture. No primary bone lesion or focal pathologic process. Soft tissues and spinal canal: No prevertebral fluid or swelling. No visible canal hematoma. 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 Other: None IMPRESSION: 1. No CT evidence of intracranial injury. Sequela of moderate to severe chronic microvascular ischemic change. 2. No acute fracture or traumatic malalignment of the cervical spine. 3. 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 Electronically Signed   By: Lorenza Cambridge M.D.   On: 03/25/2023 13:04   MR Brain Wo Contrast  Result Date: 03/05/2023 CLINICAL DATA:  Ataxia for 1 week with falls. EXAM: MRI HEAD WITHOUT CONTRAST TECHNIQUE: Multiplanar, multiecho pulse sequences of the brain and surrounding structures were obtained without intravenous contrast. COMPARISON:  MR head without contrast 01/27/2019 FINDINGS: Brain: A 4 mm acute subcortical nonhemorrhagic white matter infarct is present the posterior right temporal lobe on image 57 of series 7 and image 40 of series 9. No other acute infarcts are present. Progressive generalized atrophy and diffuse white matter disease is present. Remote lacunar infarcts are again noted within the thalami bilaterally. A remote lacunar infarct in the high left lentiform nucleus is noted. The number of remote lacunar infarcts in the cerebellum bilaterally has increased. No other acute infarcts are present. The internal auditory canals are within normal limits. The ventricles are proportionate to the degree of atrophy. No significant extraaxial fluid collection is present. Midline structures are within normal limits. Vascular: Flow is present in the major intracranial arteries.  Skull and upper cervical spine: Degenerative changes are present with slight anterolisthesis at C3-4 and a broad-based disc osteophyte complex. Craniocervical junction is normal. Sinuses/Orbits: Circumferential mucosal thickening is present in the left sphenoid sinus. The paranasal sinuses and mastoid air cells are otherwise clear. Bilateral lens replacements are noted. Globes and orbits are otherwise unremarkable. IMPRESSION: 1. 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 2. Progressive generalized atrophy and diffuse white matter disease. This likely reflects the sequela of chronic microvascular ischemia. 3. 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 4. The number of remote lacunar infarcts in the cerebellum bilaterally has increased. 5. Mild left sphenoid sinus disease. These results will be called to the ordering clinician or representative by the Radiologist Assistant, and communication documented in the PACS or [employer]. Electronically Signed   By: Marin Roberts M.D.   On: 03/05/2023 15:13      Subjective: No significant events overnight, he denies any complaints today, eager for discharge  Discharge Exam: Vitals:   03/31/23 0515 03/31/23 0746  BP: (!) 165/72 (!) 168/77  Pulse:  76  Resp:  20  Temp: 98 F (36.7 C) 98 F (36.7 C)  SpO2:  95%   Vitals:   03/31/23 0000 03/31/23 0100 03/31/23 0515 03/31/23 0746  BP: (!) 160/80  (!) 165/72 (!) 168/77  Pulse:    76  Resp:  20  Temp: 98.2 F (36.8 C)  98 F (36.7 C) 98 F (36.7 C)  TempSrc: Oral  Oral Oral  SpO2:    95%  Weight:  (!) 172.7 kg    Height:        General: Pt is alert, awake, not in acute distress Cardiovascular: RRR, S1/S2 +, no rubs, no gallops, + SEM Respiratory: CTA bilaterally, no wheezing, no rhonchi Abdominal: Soft, NT, ND, bowel sounds + Extremities: no edema, no cyanosis    The results of  significant diagnostics from this hospitalization (including imaging, microbiology, ancillary and laboratory) are listed below for reference.     Microbiology: No results found for this or any previous visit (from the past 240 hour(s)).   Labs: BNP (last 3 results) Recent Labs    03/29/23 0241 03/30/23 0537 03/31/23 0525  BNP 880.7* 791.3* 693.0*    Basic Metabolic Panel: Recent Labs  Lab 03/26/23 0500 03/28/23 0618 03/29/23 0241 03/30/23 0537 03/31/23 0525  NA 138 138 138 138 137  K 3.6 3.7 3.6 3.7 3.4*  CL 103 104 105 106 107  CO2 26 25 27 23 24   GLUCOSE 135* 126* 107* 91 118*  BUN 20 19 23 18 22   CREATININE 1.37* 1.42* 1.61* 1.26* 1.37*  CALCIUM 8.9 8.7* 8.6* 8.8* 8.5*  MG  --  2.1 2.2 2.1 2.1    Liver Function Tests: Recent Labs  Lab 03/25/23 1145  AST 20  ALT 13  ALKPHOS 78  BILITOT 1.0  PROT 6.8  ALBUMIN 3.9    No results for input(s): "LIPASE", "AMYLASE" in the last 168 hours. Recent Labs  Lab 03/25/23 1146  AMMONIA 12    CBC: Recent Labs  Lab 03/25/23 1145 03/26/23 0500 03/28/23 0618 03/29/23 0241 03/30/23 0537 03/31/23 0525  WBC 8.1 8.4 8.3 8.1 7.7 9.4  NEUTROABS 5.8  --   --  5.3 5.3 6.7  HGB 16.5 16.6 15.6 15.0 15.6 15.1  HCT 53.0* 51.9 49.2 45.7 47.3 45.6  MCV 91.5 91.7 90.9 89.3 88.4 88.4  PLT 130* 142* 148* 147* 167 171    Cardiac Enzymes: No results for input(s): "CKTOTAL", "CKMB", "CKMBINDEX", "TROPONINI" in the last 168 hours. BNP: Invalid input(s): "POCBNP" CBG: Recent Labs  Lab 03/30/23 1336 03/30/23 1512 03/30/23 2157 03/31/23 0020 03/31/23 0514  GLUCAP 95 127* 159* 114* 134*    D-Dimer No results for input(s): "DDIMER" in the last 72 hours. Hgb A1c No results for input(s): "HGBA1C" in the last 72 hours. Lipid Profile No results for input(s): "CHOL", "HDL", "LDLCALC", "TRIG", "CHOLHDL", "LDLDIRECT" in the last 72 hours. Thyroid function studies No results for input(s): "TSH", "T4TOTAL", "T3FREE",  "THYROIDAB" in the last 72 hours.  Invalid input(s): "FREET3" Anemia work up No results for input(s): "VITAMINB12", "FOLATE", "FERRITIN", "TIBC", "IRON", "RETICCTPCT" in the last 72 hours. Urinalysis    Component Value Date/Time   COLORURINE YELLOW 03/25/2023 1350   APPEARANCEUR CLEAR 03/25/2023 1350   LABSPEC 1.014 03/25/2023 1350   PHURINE 6.0 03/25/2023 1350   GLUCOSEU >=500 (A) 03/25/2023 1350   GLUCOSEU >=1000 (A) 12/17/2022 1213   HGBUR NEGATIVE 03/25/2023 1350   BILIRUBINUR NEGATIVE 03/25/2023 1350   KETONESUR 5 (A) 03/25/2023 1350   PROTEINUR >=300 (A) 03/25/2023 1350   UROBILINOGEN 0.2 12/17/2022 1213   NITRITE NEGATIVE 03/25/2023 1350   LEUKOCYTESUR NEGATIVE 03/25/2023 1350   Sepsis Labs Recent Labs  Lab 03/28/23 0618 03/29/23 0241 03/30/23 0537 03/31/23 0525  WBC 8.3 8.1 7.7 9.4    Microbiology No  results found for this or any previous visit (from the past 240 hour(s)).   Time coordinating discharge: Over 30 minutes  SIGNED:   Huey Bienenstock, MD  [facility] 03/31/2023, 11:15 AM Pager   If 7PM-7AM, please contact night-coverage [email or web address]

## 2023-03-31 NOTE — TOC Transition Note (Signed)
Transition of Care [facility]) - CM/SW Discharge Note   Patient Details  Name: Ryan Duke MRN: [id number] Date of Birth: 03/14/1941  Transition of [facility] CM/SW Contact:  Mearl Latin, LCSW Phone Number: 03/31/2023, 12:16 PM   Clinical Narrative:    Patient will DC to: [facility]  Anticipated DC date: 03/31/23 Family notified: Spouse Transport by: Sharin Mons  Eliquis to be delivered from [facility] pharmacy and brought to patient to take with him as requested by [facility].  Per MD patient ready for DC to [facility]. RN to call report prior to discharge [phone] room [phone]). RN, patient, patient's family, and facility notified of DC. Discharge Summary and FL2 sent to facility. DC packet on chart including signed DNR. Ambulance transport requested for patient.   CSW will sign off for now as social work intervention is no longer needed. Please consult [place] again if new needs arise.     Final next level of care: Skilled Nursing Facility Barriers to Discharge: Barriers Resolved   Patient Goals and CMS Choice CMS Medicare.gov Compare Post Acute Care list provided to:: Patient Represenative (must comment) Choice offered to / list presented to : Spouse  Discharge Placement     Existing PASRR number confirmed : 03/31/23          Patient chooses bed at: [facility] Patient to be transferred to facility by: [facility] Name of family member notified: Spouse Patient and family notified of of transfer: 03/31/23  Discharge Plan and Services Additional resources added to the After Visit Summary for   In-house Referral: Clinical Social Work   Post Acute Care Choice: Skilled Nursing Facility                               Social Determinants of Health (SDOH) Interventions SDOH Screenings   Food Insecurity: No Food Insecurity (03/02/2022)  Housing: Low Risk  (03/02/2022)  Transportation Needs: No Transportation Needs (03/02/2022)  Alcohol Screen: Low Risk  (03/02/2022)  Depression  (PHQ2-9): Low Risk  (09/03/2022)  Financial Resource Strain: Low Risk  (03/02/2022)  Physical Activity: Insufficiently Active (03/02/2022)  Social Connections: Moderately Isolated (03/02/2022)  Stress: No Stress Concern Present (03/02/2022)  Tobacco Use: Medium Risk (03/25/2023)     Readmission Risk Interventions    07/15/2022   11:05 AM  Readmission Risk Prevention Plan  Post Dischage Appt Complete  Medication Screening Complete  Transportation Screening Complete

## 2023-03-31 NOTE — Discharge Instructions (Addendum)
Follow with Primary MD [name], MD/SNF physician  Get CBC, CMP,  checked  by Primary MD next visit.    Activity: As tolerated with Full fall precautions use walker/cane & assistance as needed   Disposition SNF   Diet: Heart Healthy /carb modified  ------------------------------------------------------------------ Information on my medicine - ELIQUIS (apixaban)  Why was Eliquis prescribed for you? Eliquis was prescribed for you to reduce the risk of stroke.  What do You need to know about Eliquis ? Take your Eliquis TWICE DAILY - one tablet in the morning and one tablet in the evening with or without food. If you have difficulty swallowing the tablet whole please discuss with your pharmacist how to take the medication safely.  Take Eliquis exactly as prescribed by your doctor and DO NOT stop taking Eliquis without talking to the doctor who prescribed the medication.  Stopping may increase your risk of developing a stroke.  Refill your prescription before you run out.  After discharge, you should have regular check-up appointments with your healthcare provider that is prescribing your Eliquis.  In the future your dose may need to be changed if your kidney function or weight changes by a significant amount or as you get older.  What do you do if you miss a dose? If you miss a dose, take it as soon as you remember on the same day and resume taking twice daily.  Do not take more than one dose of ELIQUIS at the same time to make up a missed dose.  Important Safety Information A possible side effect of Eliquis is bleeding. You should call your healthcare provider right away if you experience any of the following: Bleeding from an injury or your nose that does not stop. Unusual colored urine (red or dark brown) or unusual colored stools (red or black). Unusual bruising for unknown reasons. A serious fall or if you hit your head (even if there is no bleeding).  Some  medicines may interact with Eliquis and might increase your risk of bleeding or clotting while on Eliquis. To help avoid this, consult your healthcare provider or pharmacist prior to using any new prescription or non-prescription medications, including herbals, vitamins, non-steroidal anti-inflammatory drugs (NSAIDs) and supplements.  This website has more information on Eliquis (apixaban): http://www.eliquis.com/eliquis/home    On your next visit with your primary care physician please Get Medicines reviewed and adjusted.   Please request your Prim.MD to go over all Hospital Tests and Procedure/Radiological results at the follow up, please get all Hospital records sent to your Prim MD by signing hospital release before you go home.   If you experience worsening of your admission symptoms, develop shortness of breath, life threatening emergency, suicidal or homicidal thoughts you must seek medical attention immediately by calling 911 or calling your MD immediately  if symptoms less severe.  You Must read complete instructions/literature along with all the possible adverse reactions/side effects for all the Medicines you take and that have been prescribed to you. Take any new Medicines after you have completely understood and accpet all the possible adverse reactions/side effects.   Do not drive, operating heavy machinery, perform activities at heights, swimming or participation in water activities or provide baby sitting services if your were admitted for syncope or siezures until you have seen by Primary MD or a Neurologist and advised to do so again.  Do not drive when taking Pain medications.    Do not take more than prescribed Pain, Sleep and Anxiety  Medications  Special Instructions: If you have smoked or chewed Tobacco  in the last 2 yrs please stop smoking, stop any regular Alcohol  and or any Recreational drug use.  Wear Seat belts while driving.   Please note  You were cared  for by a hospitalist during your hospital stay. If you have any questions about your discharge medications or the care you received while you were in the hospital after you are discharged, you can call the unit and asked to speak with the hospitalist on call if the hospitalist that took care of you is not available. Once you are discharged, your primary care physician will handle any further medical issues. Please note that NO REFILLS for any discharge medications will be authorized once you are discharged, as it is imperative that you return to your primary care physician (or establish a relationship with a primary care physician if you do not have one) for your aftercare needs so that they can reassess your need for medications and monitor your lab values.

## 2023-03-31 NOTE — H&P (Deleted)
Physician Discharge Summary  Ryan Duke ZOX:[id number] DOB: 02/03/1941 DOA: 03/25/2023  PCP: [name], MD  Admit date: 03/25/2023 Discharge date: 03/31/2023  Admitted From: (Home) Disposition:  (SNF)  Recommendations for Outpatient Follow-up:  To follow with neurology as an outpatient, ambulatory referral has been sent   Discharge Condition: (Stable) CODE STATUS: (DNR Diet recommendation: Heart Healthy / Carb Modified   Brief/Interim Summary:   82 years old Male with PMH significant for insulin-dependent type 2 diabetes, CKD stage IIIa, severe aortic stenosis s/p TAVR, hypertension, hyperlipidemia, BPH presented to the ED for the evaluation of recurrent falls.  Patient reports recurrent fall for last 1 month,  states he lost his balance when walking resulting in falls.  Patient did have an outpatient MRI which showed 4 mm acute subcortical nonhemorrhagic white matter infarct in the posterior right temporal lobe.  In the ER workup again consistent with acute hemic stroke, he was seen by neurology team, stroke workup being done subsequently he has developed severe encephalopathy as well.  Currently mentation much improved, back to baseline, plan to DC to SNF.     Acute ischemic nonhemorrhagic infarcts involving the anterior right corona radiata/basal ganglia and contralateral subcortical left frontal lobe.  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  Neurology input greatly appreciated, started on Eliquis.  Neurology input greatly appreciated, they have communicated with Structural heart team, and will arrange for repeat outpatient TTE in 6-8 weeks.    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   Dyslipidemia.  Switch  to Crestor for better control.   Hypertension.  On Norvasc   CKD 3A.  Creatinine appears to be at baseline of 1.6.   Severe aortic stenosis s/p TAVR.  Supportive care for now, TTE noted, CT scan confirms TAVR site clot now on Eliquis, seen by cardiology.   Multiple falls at home.  Likely due to combination of #1 and 2 above, CT head, CTA head and neck no acute fractures or bleed.  PT, OT and SNF.   Low NML B12 - PO B12   DM type II.  Ental status has been adjusted, continue with insulin sliding scale and Farxiga   Discharge Diagnoses:  Principal Problem:   Acute CVA (cerebrovascular accident) (HCC) Active Problems:   Hyperlipidemia with target LDL less than 100   Essential hypertension   Chronic kidney disease, stage 3a (HCC)   Insulin-requiring or dependent type II diabetes mellitus (HCC)   CVA (cerebral vascular accident) [facility])    Discharge Instructions  Discharge Instructions     Ambulatory referral to Neurology   Complete by: As directed    Follow up with stroke clinic NP (Jessica Vanschaick or Darrol Angel, if both not available, consider Manson Allan, or Ahern) at [facility] in about 4 weeks. Thanks.   Diet - low sodium heart healthy   Complete by: As directed    Discharge instructions   Complete by: As directed    Follow with Primary MD [name], MD/SNF physician  Get CBC, CMP,  checked  by Primary MD next visit.    Activity: As tolerated with Full fall precautions use walker/cane & assistance as needed   Disposition SNF   Diet: Heart Healthy /carb modified   On your next visit  with your primary care physician please Get Medicines reviewed and adjusted.   Please request your Prim.MD to go over all Hospital Tests and Procedure/Radiological results at the follow up, please get all Hospital records sent to your Prim MD by signing hospital release before you go home.   If you experience worsening of your admission symptoms, develop shortness of  breath, life threatening emergency, suicidal or homicidal thoughts you must seek medical attention immediately by calling 911 or calling your MD immediately  if symptoms less severe.  You Must read complete instructions/literature along with all the possible adverse reactions/side effects for all the Medicines you take and that have been prescribed to you. Take any new Medicines after you have completely understood and accpet all the possible adverse reactions/side effects.   Do not drive, operating heavy machinery, perform activities at heights, swimming or participation in water activities or provide baby sitting services if your were admitted for syncope or siezures until you have seen by Primary MD or a Neurologist and advised to do so again.  Do not drive when taking Pain medications.    Do not take more than prescribed Pain, Sleep and Anxiety Medications  Special Instructions: If you have smoked or chewed Tobacco  in the last 2 yrs please stop smoking, stop any regular Alcohol  and or any Recreational drug use.  Wear Seat belts while driving.   Please note  You were cared for by a hospitalist during your hospital stay. If you have any questions about your discharge medications or the care you received while you were in the hospital after you are discharged, you can call the unit and asked to speak with the hospitalist on call if the hospitalist that took care of you is not available. Once you are discharged, your primary care physician will handle any further medical issues. Please note that NO REFILLS for any discharge medications will be authorized once you are discharged, as it is imperative that you return to your primary care physician (or establish a relationship with a primary care physician if you do not have one) for your aftercare needs so that they can reassess your need for medications and monitor your lab values.   Increase activity slowly   Complete by: As directed    No  wound care   Complete by: As directed       Allergies as of 03/31/2023       Reactions   Farxiga [dapagliflozin] Other (See Comments)   Gout   Metformin And Related Other (See Comments)   "Acidosis"   Atorvastatin Other (See Comments)   Headaches   Benazepril Hcl Other (See Comments)   Was not effective   Flexeril [cyclobenzaprine] Other (See Comments)   Hallucinations    Oxycodone Other (See Comments)   Hallucinations         Medication List     STOP taking these medications    amoxicillin 500 MG tablet Commonly known as: AMOXIL   aspirin 81 MG chewable tablet   FreeStyle Libre 2 Reader [employer] 2 Sensor Misc   NovoFine 32G X 6 MM Misc Generic drug: Insulin Pen Needle   pravastatin 40 MG tablet Commonly known as: PRAVACHOL   pregabalin 150 MG capsule Commonly known as: LYRICA       TAKE these medications    acetaminophen 325 MG tablet Commonly known as: TYLENOL Take 650 mg by mouth every 6 (six) hours as needed for mild pain or moderate  pain (Uses as needed).   amLODipine 10 MG tablet Commonly known as: NORVASC Take 1 tablet (10 mg total) by mouth daily. Start taking on: Apr 01, 2023   apixaban 2.5 MG Tabs tablet Commonly known as: ELIQUIS Take 1 tablet (2.5 mg total) by mouth 2 (two) times daily.   atenolol 50 MG tablet Commonly known as: TENORMIN Take 1 tablet (50 mg total) by mouth daily.   cyanocobalamin 500 MCG tablet Commonly known as: VITAMIN B12 Take 500 mcg by mouth daily.   DULoxetine 60 MG capsule Commonly known as: CYMBALTA TAKE ONE CAPSULE DAILY   fluticasone 50 MCG/ACT nasal spray Commonly known as: FLONASE Place 1 spray into both nostrils daily as needed for allergies or rhinitis.   insulin aspart 100 UNIT/ML injection Commonly known as: novoLOG Inject 0-9 Units into the skin 3 (three) times daily with meals.   Jardiance 25 MG Tabs tablet Generic drug: empagliflozin TAKE ONE TABLET BY MOUTH ONCE DAILY  BEFORE BREAKFAST   Kerendia 20 MG Tabs Generic drug: Finerenone Take 1 tablet by mouth daily.   Lantus SoloStar 100 UNIT/ML Solostar Pen Generic drug: insulin glargine Inject 22 Units into the skin daily. What changed: how much to take   losartan 100 MG tablet Commonly known as: COZAAR Take 1 tablet (100 mg total) by mouth daily for high blood pressure   multivitamin with minerals Tabs tablet Take 1 tablet by mouth daily.   PRESERVISION AREDS PO Take 1 tablet by mouth in the morning and at bedtime.   rosuvastatin 20 MG tablet Commonly known as: CRESTOR Take 1 tablet (20 mg total) by mouth daily. Start taking on: Apr 01, 2023   senna-docusate 8.6-50 MG tablet Commonly known as: Senokot-S Take 1 tablet by mouth at bedtime as needed for mild constipation.   tamsulosin 0.4 MG Caps capsule Commonly known as: FLOMAX TAKE ONE CAPSULE BY MOUTH DAILY        Contact information for follow-up providers     [facility]. Schedule an appointment as soon as possible for a visit in 1 month(s).   Specialty: Neurology Why: stroke clinic Contact information: [address] [place] [address] [phone]             Contact information for after-discharge care     Destination     [facility] .   Service: Skilled Nursing Contact information: [address] Test Update Address [place] [address] [phone]                    Allergies  Allergen Reactions   Marcelline Deist [Dapagliflozin] Other (See Comments)    Gout    Metformin And Related Other (See Comments)    "Acidosis"   Atorvastatin Other (See Comments)    Headaches    Benazepril Hcl Other (See Comments)    Was not effective   Flexeril [Cyclobenzaprine] Other (See Comments)    Hallucinations    Oxycodone Other (See Comments)    Hallucinations     Consultations: Neurology   cardiology    Procedures/Studies: CT CORONARY MORPH W/CTA COR W/SCORE W/CA W/CM &/OR WO/CM  Addendum Date: 03/29/2023   ADDENDUM REPORT: 03/29/2023 12:30 ADDENDUM: Elsewhere, there is a specific abnormal lymph node enlargement seen in the visualized chest including visualized axillary regions, hilum or mediastinum. Slightly patulous thoracic esophagus. Status post TAVR. The heart is slightly enlarged. Please correlate with separate portion of the dictation describing the cardiac sections. There  is some irregular partially calcified plaque along the visualized portions of the thoracic aorta. There is some breathing motion seen on several series. Dependent linear opacity at the bases likely scar or atelectasis. No pleural effusion once again there are some tiny nodules identified under 5 mm. Some of these calcified such as right lung series 12, image 50. Noncalcified subpleural focus identified series 12, image 70 measuring 4 mm. Nodule along the left interlobar fissure measures 6 mm on series 12, image 35. This nodule is new. Few other nodules as well. Non-contrast chest CT at 3-6 months is recommended. If the nodules are stable at time of repeat CT, then future CT at 18-24 months (from today's scan) is considered optional for low-risk patients, but is recommended for high-risk patients. This recommendation follows the consensus statement: Guidelines for Management of Incidental Pulmonary Nodules Detected on CT Images: From the Fleischner Society 2017; Radiology 2017; 284:228-243. Diffuse degenerative changes along the spine with bridging syndesmophytes and osteophytes. Old posterior right-sided rib fracture. Electronically Signed   By: Karen Kays M.D.   On: 03/29/2023 12:30   Result Date: 03/29/2023 CLINICAL DATA:  S/p TAVR procedure 06/2022 with 26mm Edwards Sapien 3 valve. Increased gradients through valve noted on echo EXAM: Cardiac TAVR CT TECHNIQUE: The patient was scanned on a [employer].  A 120 kV retrospective scan was triggered in the descending thoracic aorta at 111 HU's. Gantry rotation speed was 250 msecs and collimation was .6 mm. No beta blockade or nitro were given. The 3D data set was reconstructed in 5% intervals of the R-R cycle. Systolic and diastolic phases were analyzed on a dedicated work station using MPR, MIP and VRT modes. The patient received 100 cc of contrast. FINDINGS: Aortic valve: S/p TAVR procedure with 26mm Edwards Sapien 3 valve. There is HALT/HAM of the leaflets of both the left and noncoronary cusps Right atrium: Moderate enlargement Right ventricle: Mild enlargement Pulmonary arteries: Dilated main pulmonary artery measuring 31mm Aorta: Dilated ascending aorta measuring 40mm. 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 Pulmonary veins: Normal configuration Left atrium: Mild enlargement Left ventricle: Normal size Pericardium: Normal thickness Coronary arteries: Normal origin IMPRESSION: 1. S/p TAVR procedure with 26mm Edwards Sapien 3 valve. There is HALT/HAM of the leaflets of both the left and noncoronary cusps 2. 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 On: 03/29/2023 11:43   DG Chest Port 1 View  Result Date: 03/28/2023 CLINICAL DATA:  Shortness of breath EXAM: PORTABLE CHEST 1 VIEW COMPARISON:  10/19/2022 FINDINGS: Normal heart size. Transcatheter aortic valve replacement. Chronic blunting at the lateral left costophrenic sulcus which is from fat by most recent CT. There is no edema, consolidation, effusion, or pneumothorax. IMPRESSION: No acute finding when compared to prior. Electronically Signed   By: Tiburcio Pea M.D.   On: 03/28/2023 06:19   ECHOCARDIOGRAM COMPLETE  Result Date: 03/27/2023    ECHOCARDIOGRAM REPORT   Patient Name:   Ryan Duke  Davlin Date of Exam: 03/26/2023 Medical Rec #:  [id number]       Height:       71.0 in Accession #:    [id number]      Weight:       225.0 lb Date of Birth:  [birth year]       BSA:  2.217 m Patient Age:    82 years        BP:           180/78 mmHg Patient Gender: M               HR:           84 bpm. Exam Location:  Inpatient Procedure: 2D Echo, Cardiac Doppler and Color Doppler Indications:    stroke  History:        Patient has prior history of Echocardiogram examinations. Risk                 Factors:Diabetes, Dyslipidemia and Hypertension.  Sonographer:    Mike Gip Referring Phys: [id number] [facility] IMPRESSIONS  1. Left ventricular ejection fraction, by estimation, is 70 to 75%. The left ventricle has hyperdynamic function. Left ventricular endocardial border not optimally defined to evaluate regional wall motion. There is severe left ventricular hypertrophy. Left ventricular diastolic parameters are indeterminate.  2. Right ventricular systolic function is normal. The right ventricular size is normal. Tricuspid regurgitation signal is inadequate for assessing PA pressure.  3. The mitral valve is normal in structure. No evidence of mitral valve regurgitation. No evidence of mitral stenosis.  4. Edwards Sapien 3 Ultra Resilia THV (size 26 mm). Operative note 07/14/2022 indicates after initial valve deployed significant paravalvular leak, a second Edwards Sapien 3 ultra resilia THV 26 mm valve was then also deployed which significantly reduced  paravalvular leak. The gradient across the prosthetic valves has increased since 08/19/22 study(prior mean gradient , up to 30 mmHg on this study). Consider TEE for further structural evaluation of prosthetic valve. . The aortic valve was not well visualized. Aortic valve regurgitation Trivial paravalvular leak.  5. The inferior vena cava is normal in size with greater than 50% respiratory variability, suggesting right atrial pressure of 3 mmHg. FINDINGS   Left Ventricle: Left ventricular ejection fraction, by estimation, is 70 to 75%. The left ventricle has hyperdynamic function. Left ventricular endocardial border not optimally defined to evaluate regional wall motion. The left ventricular internal cavity size was normal in size. There is severe left ventricular hypertrophy. Left ventricular diastolic parameters are indeterminate. Right Ventricle: The right ventricular size is normal. Right vetricular wall thickness was not well visualized. Right ventricular systolic function is normal. Tricuspid regurgitation signal is inadequate for assessing PA pressure. Left Atrium: Left atrial size was normal in size. Right Atrium: Right atrial size was normal in size. Pericardium: There is no evidence of pericardial effusion. Mitral Valve: The mitral valve is normal in structure. No evidence of mitral valve regurgitation. No evidence of mitral valve stenosis. Tricuspid Valve: The tricuspid valve is normal in structure. Tricuspid valve regurgitation is not demonstrated. No evidence of tricuspid stenosis. Aortic Valve: Edwards Sapien 3 Ultra Resilia THV (size 26 mm). Operative note 07/14/2022 indicates after initial valve deployed significant paravalvular leak, a second Edwards Sapien 3 ultra resilia THV 26 mm valve was then also deployed which significantly reduced paravalvular leak. The gradient across the prosthetic valves has increased since 08/19/22 study(prior mean gradient , up to 30 mmHg on this study). Consider TEE for further structural evaluation of prosthetic valve. The aortic valve was not well visualized. Aortic valve regurgitation Trivial paravalvular leak. Aortic valve mean gradient measures 30.0 mmHg. Aortic valve peak gradient measures 55.1 mmHg. Aortic valve area, by VTI measures 1.55 cm. Pulmonic Valve: The pulmonic valve was not well visualized. Pulmonic valve regurgitation is not visualized. No evidence of pulmonic  stenosis. Aorta: The aortic root is  normal in size and structure. Venous: The inferior vena cava is normal in size with greater than 50% respiratory variability, suggesting right atrial pressure of 3 mmHg. IAS/Shunts: No atrial level shunt detected by color flow Doppler.  LEFT VENTRICLE PLAX 2D LVIDd:         3.90 cm      Diastology LVIDs:         2.20 cm      LV e' medial:    4.03 cm/s LV PW:         2.10 cm      LV E/e' medial:  10.0 LV IVS:        2.20 cm      LV e' lateral:   4.57 cm/s LVOT diam:     2.10 cm      LV E/e' lateral: 8.9 LV SV:         115 LV SV Index:   52 LVOT Area:     3.46 cm  LV Volumes (MOD) LV vol d, MOD A2C: 101.0 ml LV vol d, MOD A4C: 104.0 ml LV vol s, MOD A2C: 31.0 ml LV vol s, MOD A4C: 26.3 ml LV SV MOD A2C:     70.0 ml LV SV MOD A4C:     104.0 ml LV SV MOD BP:      76.6 ml RIGHT VENTRICLE             IVC RV Basal diam:  4.40 cm     IVC diam: 1.90 cm RV S prime:     13.90 cm/s TAPSE (M-mode): 2.4 cm LEFT ATRIUM             Index        RIGHT ATRIUM           Index LA diam:        3.60 cm 1.62 cm/m   RA Area:     25.40 cm LA Vol (A2C):   83.1 ml 37.49 ml/m  RA Volume:   75.80 ml  34.19 ml/m LA Vol (A4C):   42.4 ml 19.13 ml/m LA Biplane Vol: 59.4 ml 26.80 ml/m  AORTIC VALVE AV Area (Vmax):    1.19 cm AV Area (Vmean):   1.32 cm AV Area (VTI):     1.55 cm AV Vmax:           371.00 cm/s AV Vmean:          256.000 cm/s AV VTI:            0.738 m AV Peak Grad:      55.1 mmHg AV Mean Grad:      30.0 mmHg LVOT Vmax:         128.00 cm/s LVOT Vmean:        97.800 cm/s LVOT VTI:          0.331 m LVOT/AV VTI ratio: 0.45  AORTA Ao Root diam: 3.90 cm Ao Asc diam:  3.10 cm MITRAL VALVE MV Area (PHT): 2.33 cm     SHUNTS MV Decel Time: 326 msec     Systemic VTI:  0.33 m MV E velocity: 40.50 cm/s   Systemic Diam: 2.10 cm MV A velocity: 104.00 cm/s MV E/A ratio:  0.39 Dina Rich MD Electronically signed by Dina Rich MD Signature Date/Time: 03/27/2023/1:59:42 PM    Final    VAS [place] LOWER EXTREMITY VENOUS (DVT)  Result  Date: 03/27/2023  Lower Venous DVT Study  Patient Name:  Ryan Duke [place]  Date of Exam:   03/27/2023 Medical Rec #: [id number]        Accession #:    [id number] Date of Birth: 09/07/1941        Patient Gender: M Patient Age:   80 years Exam Location:  [facility] Procedure:      VAS [place] LOWER EXTREMITY VENOUS (DVT) Referring Phys: Scheryl Marten XU --------------------------------------------------------------------------------  Indications: Stroke.  Limitations: Poor ultrasound/tissue interface. Comparison Study: Previous exam on 09/06/2017 was negative for DVT Performing Technologist: Ernestene Mention RVT, RDMS  Examination Guidelines: A complete evaluation includes B-mode imaging, spectral Doppler, color Doppler, and power Doppler as needed of all accessible portions of each vessel. Bilateral testing is considered an integral part of a complete examination. Limited examinations for reoccurring indications may be performed as noted. The reflux portion of the exam is performed with the patient in reverse Trendelenburg.  +---------+---------------+---------+-----------+----------+--------------+ RIGHT    CompressibilityPhasicitySpontaneityPropertiesThrombus Aging +---------+---------------+---------+-----------+----------+--------------+ CFV      Full           Yes      Yes                                 +---------+---------------+---------+-----------+----------+--------------+ SFJ      Full                                                        +---------+---------------+---------+-----------+----------+--------------+ FV Prox  Full           Yes      Yes                                 +---------+---------------+---------+-----------+----------+--------------+ FV Mid   Full           No       Yes        pulsatile                +---------+---------------+---------+-----------+----------+--------------+ FV DistalFull           No       Yes        pulsatile                 +---------+---------------+---------+-----------+----------+--------------+ PFV      Full                                                        +---------+---------------+---------+-----------+----------+--------------+ POP      Full           No       Yes        pulsatile                +---------+---------------+---------+-----------+----------+--------------+ PTV      Full                                                        +---------+---------------+---------+-----------+----------+--------------+  PERO     Full                                                        +---------+---------------+---------+-----------+----------+--------------+   +---------+---------------+---------+-----------+----------+-------------------+ LEFT     CompressibilityPhasicitySpontaneityPropertiesThrombus Aging      +---------+---------------+---------+-----------+----------+-------------------+ CFV      Full           Yes      Yes                                      +---------+---------------+---------+-----------+----------+-------------------+ SFJ      Full                                                             +---------+---------------+---------+-----------+----------+-------------------+ FV Prox  Full           Yes      Yes                                      +---------+---------------+---------+-----------+----------+-------------------+ FV Mid   Full           Yes      Yes                                      +---------+---------------+---------+-----------+----------+-------------------+ FV DistalFull           Yes      Yes                                      +---------+---------------+---------+-----------+----------+-------------------+ PFV      Full                                                             +---------+---------------+---------+-----------+----------+-------------------+ POP      Full           Yes      Yes                                       +---------+---------------+---------+-----------+----------+-------------------+ PTV                                                   Not well visualized +---------+---------------+---------+-----------+----------+-------------------+ PERO  Not well visualized +---------+---------------+---------+-----------+----------+-------------------+     Summary: BILATERAL: - No evidence of deep vein thrombosis seen in the lower extremities, bilaterally. -No evidence of popliteal cyst, bilaterally.   *See table(s) above for measurements and observations. Electronically signed by Heath Lark on 03/27/2023 at 1:35:09 PM.    Final    EEG adult  Result Date: 03/26/2023 Charlsie Quest, MD     03/26/2023 12:04 PM Patient Name: JAYEN BROMWELL MRN: [id number] Epilepsy Attending: Charlsie Quest Referring Physician/Provider: Gordy Councilman, MD Date: 03/26/2023 Duration: 22.32 mins Patient history: 82yo M getting eeg to evaluate for seizure. Level of alertness: Awake AEDs during EEG study: None Technical aspects: This EEG study was done with scalp electrodes positioned according to the 10-20 International system of electrode placement. Electrical activity was reviewed with band pass filter of 1-70Hz , sensitivity of 7 uV/mm, display speed of 36mm/sec with a 60Hz  notched filter applied as appropriate. EEG data were recorded continuously and digitally stored.  Video monitoring was available and reviewed as appropriate. Description: The posterior dominant rhythm consists of 7 Hz activity of moderate voltage (25-35 uV) seen predominantly in posterior head regions, symmetric and reactive to eye opening and eye closing. EEG showed continuous generalized 5 to 7 Hz theta slowing admixed with intermittent 2-3Hz  delta slowing. Hyperventilation and photic stimulation were not performed.   Of note, eeg was technically difficult due to  significant electrical artifact. ABNORMALITY - Continuous slow, generalized - Background slow IMPRESSION: This technically difficult study is suggestive of moderate diffuse encephalopathy. No seizures or epileptiform discharges were seen throughout the recording. Charlsie Quest   CT Angio Head W or Wo Contrast  Result Date: 03/25/2023 CLINICAL DATA:  Initial evaluation for stroke. EXAM: CT ANGIOGRAPHY HEAD AND NECK TECHNIQUE: Multidetector CT imaging of the head and neck was performed using the standard protocol during bolus administration of intravenous contrast. Multiplanar CT image reconstructions and MIPs were obtained to evaluate the vascular anatomy. Carotid stenosis measurements (when applicable) are obtained utilizing NASCET criteria, using the distal internal carotid diameter as the denominator. RADIATION DOSE REDUCTION: This exam was performed according to the departmental dose-optimization program which includes automated exposure control, adjustment of the mA and/or kV according to patient size and/or use of iterative reconstruction technique. CONTRAST:  OMNIPAQUE IOHEXOL 350 MG/ML SOLN COMPARISON:  MRI and CT from earlier the same day. FINDINGS: CTA NECK FINDINGS Aortic arch: Visualized aortic arch normal caliber with standard branch pattern. Moderate atheromatous change about the arch itself, with irregular soft plaque/thrombus protruding into the aortic lumen (series 504, image 23). Right carotid system: Right common and internal carotid arteries are patent without stenosis or dissection. Left carotid system: Left common and internal carotid arteries are patent without stenosis or dissection. Vertebral arteries: Both vertebral arteries arise from the subclavian arteries. No proximal subclavian artery stenosis. Mild atheromatous irregularity about the vertebral arteries without high-grade stenosis or dissection. Skeleton: no discrete or worrisome osseous lesions. Moderate degenerative  spondylosis at C4-5 through C6-7. Other neck: No other acute soft tissue abnormality within the neck. Upper chest: Visualized upper chest demonstrates no acute finding. Review of the MIP images confirms the above findings CTA HEAD FINDINGS Anterior circulation: Mild atheromatous change within the carotid siphons without stenosis. A1 segments, anterior communicating complex common anterior cerebral arteries widely patent. No M1 stenosis or occlusion. No proximal MCA branch occlusion or stenosis. Distal MCA branches perfused and symmetric. Posterior circulation: Both V4 segments patent without stenosis. Both PICA patent. Short-segment fenestration at  the vertebrobasilar junction noted. Basilar diminutive but widely patent. Superior cerebral arteries patent bilaterally. Predominant fetal type origin of the PCAs. Both PCAs widely patent to their distal aspects without stenosis. Venous sinuses: Grossly patent allowing for timing the contrast bolus. Anatomic variants: As above.  No aneurysm. Review of the MIP images confirms the above findings IMPRESSION: 1. Negative CTA for large vessel occlusion or other emergent finding. 2. Mild atheromatous disease for age without hemodynamically significant or correctable stenosis. Aortic Atherosclerosis (ICD10-I70.0). Electronically Signed   By: Rise Mu M.D.   On: 03/25/2023 22:09   CT Angio Neck W and/or Wo Contrast  Result Date: 03/25/2023 CLINICAL DATA:  Initial evaluation for stroke. EXAM: CT ANGIOGRAPHY HEAD AND NECK TECHNIQUE: Multidetector CT imaging of the head and neck was performed using the standard protocol during bolus administration of intravenous contrast. Multiplanar CT image reconstructions and MIPs were obtained to evaluate the vascular anatomy. Carotid stenosis measurements (when applicable) are obtained utilizing NASCET criteria, using the distal internal carotid diameter as the denominator. RADIATION DOSE REDUCTION: This exam was performed  according to the departmental dose-optimization program which includes automated exposure control, adjustment of the mA and/or kV according to patient size and/or use of iterative reconstruction technique. CONTRAST:  OMNIPAQUE IOHEXOL 350 MG/ML SOLN COMPARISON:  MRI and CT from earlier the same day. FINDINGS: CTA NECK FINDINGS Aortic arch: Visualized aortic arch normal caliber with standard branch pattern. Moderate atheromatous change about the arch itself, with irregular soft plaque/thrombus protruding into the aortic lumen (series 504, image 23). Right carotid system: Right common and internal carotid arteries are patent without stenosis or dissection. Left carotid system: Left common and internal carotid arteries are patent without stenosis or dissection. Vertebral arteries: Both vertebral arteries arise from the subclavian arteries. No proximal subclavian artery stenosis. Mild atheromatous irregularity about the vertebral arteries without high-grade stenosis or dissection. Skeleton: no discrete or worrisome osseous lesions. Moderate degenerative spondylosis at C4-5 through C6-7. Other neck: No other acute soft tissue abnormality within the neck. Upper chest: Visualized upper chest demonstrates no acute finding. Review of the MIP images confirms the above findings CTA HEAD FINDINGS Anterior circulation: Mild atheromatous change within the carotid siphons without stenosis. A1 segments, anterior communicating complex common anterior cerebral arteries widely patent. No M1 stenosis or occlusion. No proximal MCA branch occlusion or stenosis. Distal MCA branches perfused and symmetric. Posterior circulation: Both V4 segments patent without stenosis. Both PICA patent. 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 Basilar diminutive but widely patent. Superior cerebral arteries patent bilaterally. Predominant fetal type origin of the PCAs. Both PCAs widely patent to their distal aspects without  stenosis. Venous sinuses: Grossly patent allowing for timing the contrast bolus. Anatomic variants: As above.  No aneurysm. Review of the MIP images confirms the above findings IMPRESSION: 1. Negative CTA for large vessel occlusion or other emergent finding. 2. Mild atheromatous disease for age without hemodynamically significant or correctable stenosis. Aortic Atherosclerosis (ICD10-I70.0). Electronically Signed   By: Rise Mu M.D.   On: 03/25/2023 22:09   MR BRAIN WO CONTRAST  Result Date: 03/25/2023 CLINICAL DATA:  Initial evaluation for word-finding difficulty, recent stroke. EXAM: MRI HEAD WITHOUT CONTRAST TECHNIQUE: Multiplanar, multiecho pulse sequences of the brain and surrounding structures were obtained without intravenous contrast. COMPARISON:  CT from earlier same day as well as prior MRI from 03/05/2023. FINDINGS: Brain: Age-appropriate cerebral volume loss. Patchy T2/FLAIR hyperintensity involving the periventricular deep white matter both cerebral hemispheres, consistent with moderate chronic microvascular ischemic  disease. Few scatter remote lacunar infarcts present about the hemispheric cerebral white matter and deep gray nuclei. Few scattered small remote bilateral cerebellar infarcts noted. Patchy subcentimeter acute ischemic nonhemorrhagic infarct present at the anterior right frontal corona radiata/basal ganglia (series 9, image 32). Additional subcentimeter acute ischemic nonhemorrhagic subcortical infarct noted at the contralateral left frontal lobe (series 9, image 39). Normal expected interval evolution of previously identified subcentimeter infarct at the right temporal occipital junction (series 9, image 23). No associated hemorrhage or mass effect. No other evidence for acute or subacute ischemia. No acute or chronic intracranial blood products. No mass lesion, mass effect or midline shift. No hydrocephalus or extra-axial fluid collection. Pituitary gland and suprasellar  region within normal limits. Vascular: Major intracranial vascular flow voids are maintained. Skull and upper cervical spine: Craniocervical junction within normal limits. Bone marrow signal intensity normal. No scalp soft tissue abnormality. Sinuses/Orbits: Prior bilateral ocular lens replacement. Paranasal sinuses are largely clear. No significant mastoid effusion. Other: None. IMPRESSION: 1. 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 2. Normal expected interval evolution of previously identified subcentimeter infarct at the right temporoccipital junction. No associated hemorrhage or mass effect. 3. Underlying age-appropriate cerebral volume loss with moderate chronic microvascular ischemic disease, with a few scattered remote lacunar infarcts involving the hemispheric cerebral white matter, deep gray nuclei, and cerebellum. Electronically Signed   By: Rise Mu M.D.   On: 03/25/2023 18:19   CT HEAD WO CONTRAST  Result Date: 03/25/2023 CLINICAL DATA:  Fall EXAM: CT HEAD WITHOUT CONTRAST CT CERVICAL SPINE WITHOUT CONTRAST TECHNIQUE: Multidetector CT imaging of the head and cervical spine was performed following the standard protocol without intravenous contrast. Multiplanar CT image reconstructions of the cervical spine were also generated. RADIATION DOSE REDUCTION: This exam was performed according to the departmental dose-optimization program which includes automated exposure control, adjustment of the mA and/or kV according to patient size and/or use of iterative reconstruction technique. COMPARISON:  None Available. FINDINGS: CT HEAD FINDINGS Brain: No evidence of acute infarction, hemorrhage, hydrocephalus, extra-axial collection or mass lesion/mass effect. There is sequela of moderate severe chronic microvascular ischemic change. Vascular: No hyperdense vessel or  unexpected calcification. Skull: Normal. Negative for fracture or focal lesion. Sinuses/Orbits: No middle ear or mastoid effusion. Paranasal sinuses clear. Bilateral lens replacement. Orbits are otherwise unremarkable Other: None. CT CERVICAL SPINE FINDINGS Alignment: There is straightening of the normal cervical lordosis. Skull base and vertebrae: No acute fracture. No primary bone lesion or focal pathologic process. Soft tissues and spinal canal: No prevertebral fluid or swelling. No visible canal hematoma. 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 Other: None IMPRESSION: 1. No CT evidence of intracranial injury. Sequela of moderate to severe chronic microvascular ischemic change. 2. No acute fracture or traumatic malalignment of the cervical spine. 3. 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 Electronically Signed   By: Lorenza Cambridge M.D.   On: 03/25/2023 13:04   CT Cervical Spine Wo Contrast  Result Date: 03/25/2023 CLINICAL DATA:  Fall EXAM: CT HEAD WITHOUT CONTRAST CT CERVICAL SPINE WITHOUT CONTRAST TECHNIQUE: Multidetector CT imaging of the head and cervical spine was performed following the standard protocol without intravenous contrast. Multiplanar CT image reconstructions of the cervical spine were also generated. RADIATION DOSE REDUCTION: This exam was performed according to the departmental dose-optimization program which includes automated exposure control, adjustment of the mA and/or kV according to patient  size and/or use of iterative reconstruction technique. COMPARISON:  None Available. FINDINGS: CT HEAD FINDINGS Brain: No evidence of acute infarction, hemorrhage, hydrocephalus, extra-axial collection or mass lesion/mass effect. There is sequela of moderate severe chronic microvascular ischemic change. Vascular: No hyperdense vessel or unexpected calcification. Skull: Normal. Negative for fracture or focal lesion. Sinuses/Orbits: No middle ear or  mastoid effusion. Paranasal sinuses clear. Bilateral lens replacement. Orbits are otherwise unremarkable Other: None. CT CERVICAL SPINE FINDINGS Alignment: There is straightening of the normal cervical lordosis. Skull base and vertebrae: No acute fracture. No primary bone lesion or focal pathologic process. Soft tissues and spinal canal: No prevertebral fluid or swelling. No visible canal hematoma. 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 Other: None IMPRESSION: 1. No CT evidence of intracranial injury. Sequela of moderate to severe chronic microvascular ischemic change. 2. No acute fracture or traumatic malalignment of the cervical spine. 3. 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 Electronically Signed   By: Lorenza Cambridge M.D.   On: 03/25/2023 13:04   MR Brain Wo Contrast  Result Date: 03/05/2023 CLINICAL DATA:  Ataxia for 1 week with falls. EXAM: MRI HEAD WITHOUT CONTRAST TECHNIQUE: Multiplanar, multiecho pulse sequences of the brain and surrounding structures were obtained without intravenous contrast. COMPARISON:  MR head without contrast 01/27/2019 FINDINGS: Brain: A 4 mm acute subcortical nonhemorrhagic white matter infarct is present the posterior right temporal lobe on image 57 of series 7 and image 40 of series 9. No other acute infarcts are present. Progressive generalized atrophy and diffuse white matter disease is present. Remote lacunar infarcts are again noted within the thalami bilaterally. A remote lacunar infarct in the high left lentiform nucleus is noted. The number of remote lacunar infarcts in the cerebellum bilaterally has increased. No other acute infarcts are present. The internal auditory canals are within normal limits. The ventricles are proportionate to the degree of atrophy. No significant extraaxial fluid collection is present. Midline structures are within normal limits. Vascular: Flow is present in the major intracranial arteries.  Skull and upper cervical spine: Degenerative changes are present with slight anterolisthesis at C3-4 and a broad-based disc osteophyte complex. Craniocervical junction is normal. Sinuses/Orbits: Circumferential mucosal thickening is present in the left sphenoid sinus. The paranasal sinuses and mastoid air cells are otherwise clear. Bilateral lens replacements are noted. Globes and orbits are otherwise unremarkable. IMPRESSION: 1. 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 2. Progressive generalized atrophy and diffuse white matter disease. This likely reflects the sequela of chronic microvascular ischemia. 3. 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 4. The number of remote lacunar infarcts in the cerebellum bilaterally has increased. 5. Mild left sphenoid sinus disease. These results will be called to the ordering clinician or representative by the Radiologist Assistant, and communication documented in the PACS or [employer]. Electronically Signed   By: Marin Roberts M.D.   On: 03/05/2023 15:13      Subjective: No significant events overnight, he denies any complaints today, eager for discharge  Discharge Exam: Vitals:   03/31/23 0515 03/31/23 0746  BP: (!) 165/72 (!) 168/77  Pulse:  76  Resp:  20  Temp: 98 F (36.7 C) 98 F (36.7 C)  SpO2:  95%   Vitals:   03/31/23 0000 03/31/23 0100 03/31/23 0515 03/31/23 0746  BP: (!) 160/80  (!) 165/72 (!) 168/77  Pulse:    76  Resp:  20  Temp: 98.2 F (36.8 C)  98 F (36.7 C) 98 F (36.7 C)  TempSrc: Oral  Oral Oral  SpO2:    95%  Weight:  (!) 172.7 kg    Height:        General: Pt is alert, awake, not in acute distress Cardiovascular: RRR, S1/S2 +, no rubs, no gallops, + SEM Respiratory: CTA bilaterally, no wheezing, no rhonchi Abdominal: Soft, NT, ND, bowel sounds + Extremities: no edema, no cyanosis    The results of  significant diagnostics from this hospitalization (including imaging, microbiology, ancillary and laboratory) are listed below for reference.     Microbiology: No results found for this or any previous visit (from the past 240 hour(s)).   Labs: BNP (last 3 results) Recent Labs    03/29/23 0241 03/30/23 0537 03/31/23 0525  BNP 880.7* 791.3* 693.0*   Basic Metabolic Panel: Recent Labs  Lab 03/26/23 0500 03/28/23 0618 03/29/23 0241 03/30/23 0537 03/31/23 0525  NA 138 138 138 138 137  K 3.6 3.7 3.6 3.7 3.4*  CL 103 104 105 106 107  CO2 26 25 27 23 24   GLUCOSE 135* 126* 107* 91 118*  BUN 20 19 23 18 22   CREATININE 1.37* 1.42* 1.61* 1.26* 1.37*  CALCIUM 8.9 8.7* 8.6* 8.8* 8.5*  MG  --  2.1 2.2 2.1 2.1   Liver Function Tests: Recent Labs  Lab 03/25/23 1145  AST 20  ALT 13  ALKPHOS 78  BILITOT 1.0  PROT 6.8  ALBUMIN 3.9   No results for input(s): "LIPASE", "AMYLASE" in the last 168 hours. Recent Labs  Lab 03/25/23 1146  AMMONIA 12   CBC: Recent Labs  Lab 03/25/23 1145 03/26/23 0500 03/28/23 0618 03/29/23 0241 03/30/23 0537 03/31/23 0525  WBC 8.1 8.4 8.3 8.1 7.7 9.4  NEUTROABS 5.8  --   --  5.3 5.3 6.7  HGB 16.5 16.6 15.6 15.0 15.6 15.1  HCT 53.0* 51.9 49.2 45.7 47.3 45.6  MCV 91.5 91.7 90.9 89.3 88.4 88.4  PLT 130* 142* 148* 147* 167 171   Cardiac Enzymes: No results for input(s): "CKTOTAL", "CKMB", "CKMBINDEX", "TROPONINI" in the last 168 hours. BNP: Invalid input(s): "POCBNP" CBG: Recent Labs  Lab 03/30/23 1336 03/30/23 1512 03/30/23 2157 03/31/23 0020 03/31/23 0514  GLUCAP 95 127* 159* 114* 134*   D-Dimer No results for input(s): "DDIMER" in the last 72 hours. Hgb A1c No results for input(s): "HGBA1C" in the last 72 hours. Lipid Profile No results for input(s): "CHOL", "HDL", "LDLCALC", "TRIG", "CHOLHDL", "LDLDIRECT" in the last 72 hours. Thyroid function studies No results for input(s): "TSH", "T4TOTAL", "T3FREE", "THYROIDAB" in the  last 72 hours.  Invalid input(s): "FREET3" Anemia work up No results for input(s): "VITAMINB12", "FOLATE", "FERRITIN", "TIBC", "IRON", "RETICCTPCT" in the last 72 hours. Urinalysis    Component Value Date/Time   COLORURINE YELLOW 03/25/2023 1350   APPEARANCEUR CLEAR 03/25/2023 1350   LABSPEC 1.014 03/25/2023 1350   PHURINE 6.0 03/25/2023 1350   GLUCOSEU >=500 (A) 03/25/2023 1350   GLUCOSEU >=1000 (A) 12/17/2022 1213   HGBUR NEGATIVE 03/25/2023 1350   BILIRUBINUR NEGATIVE 03/25/2023 1350   KETONESUR 5 (A) 03/25/2023 1350   PROTEINUR >=300 (A) 03/25/2023 1350   UROBILINOGEN 0.2 12/17/2022 1213   NITRITE NEGATIVE 03/25/2023 1350   LEUKOCYTESUR NEGATIVE 03/25/2023 1350   Sepsis Labs Recent Labs  Lab 03/28/23 0618 03/29/23 0241 03/30/23 0537 03/31/23 0525  WBC 8.3 8.1 7.7 9.4   Microbiology No results found for this or any previous  visit (from the past 240 hour(s)).   Time coordinating discharge: Over 30 minutes  SIGNED:   Huey Bienenstock, MD  [facility] 03/31/2023, 10:35 AM Pager   If 7PM-7AM, please contact night-coverage [email or web address]

## 2023-03-31 NOTE — TOC Progression Note (Signed)
Transition of Care [facility]) - Progression Note    Patient Details  Name: TAELOR MONCADA MRN: [id number] Date of Birth: 1941-06-26  [facility] CM/SW Contact  Mearl Latin, LCSW Phone Number: 03/31/2023, 9:28 AM  Clinical Narrative:    Insurance approval received for [facility], Ref# [id number], effective 03/31/2023-04/02/2023.   Expected Discharge Plan: Skilled Nursing Facility Barriers to Discharge: Continued Medical Work up  Expected Discharge Plan and Services In-house Referral: Clinical Social Work   Post Acute Care Choice: Skilled Nursing Facility Living arrangements for the past 2 months: Single Family Home                                       Social Determinants of Health (SDOH) Interventions SDOH Screenings   Food Insecurity: No Food Insecurity (03/02/2022)  Housing: Low Risk  (03/02/2022)  Transportation Needs: No Transportation Needs (03/02/2022)  Alcohol Screen: Low Risk  (03/02/2022)  Depression (PHQ2-9): Low Risk  (09/03/2022)  Financial Resource Strain: Low Risk  (03/02/2022)  Physical Activity: Insufficiently Active (03/02/2022)  Social Connections: Moderately Isolated (03/02/2022)  Stress: No Stress Concern Present (03/02/2022)  Tobacco Use: Medium Risk (03/25/2023)    Readmission Risk Interventions    07/15/2022   11:05 AM  Readmission Risk Prevention Plan  Post Dischage Appt Complete  Medication Screening Complete  Transportation Screening Complete

## 2023-03-31 NOTE — Progress Notes (Signed)
Mobility Specialist - Progress Note   03/31/23 1206  Mobility  Activity Ambulated with assistance in hallway  Level of Assistance Moderate assist, patient does 50-74%  Assistive Device Front wheel walker  Distance Ambulated (ft) 100 ft  Activity Response Tolerated well  Mobility Referral Yes  $Mobility charge 1 Mobility   Pt was received in bed and agreeable to mobility. Pt was ModA throughout ambulation. Pt needed verbal cues to avoid object in hallway. Pt started to fatigue near the end of session. Pt was returned to bed with all needs met and bed alarm on. RN aware of session.   Alda Lea  Mobility Specialist Please contact via Special educational needs teacher or Rehab office at [phone]

## 2023-04-01 DIAGNOSIS — L89152 Pressure ulcer of sacral region, stage 2: Secondary | ICD-10-CM | POA: Diagnosis not present

## 2023-04-01 DIAGNOSIS — E559 Vitamin D deficiency, unspecified: Secondary | ICD-10-CM | POA: Diagnosis not present

## 2023-04-01 DIAGNOSIS — I350 Nonrheumatic aortic (valve) stenosis: Secondary | ICD-10-CM | POA: Diagnosis not present

## 2023-04-01 DIAGNOSIS — E119 Type 2 diabetes mellitus without complications: Secondary | ICD-10-CM | POA: Diagnosis not present

## 2023-04-08 DIAGNOSIS — E119 Type 2 diabetes mellitus without complications: Secondary | ICD-10-CM | POA: Diagnosis not present

## 2023-04-08 DIAGNOSIS — I350 Nonrheumatic aortic (valve) stenosis: Secondary | ICD-10-CM | POA: Diagnosis not present

## 2023-04-08 DIAGNOSIS — E559 Vitamin D deficiency, unspecified: Secondary | ICD-10-CM | POA: Diagnosis not present

## 2023-04-08 DIAGNOSIS — L89152 Pressure ulcer of sacral region, stage 2: Secondary | ICD-10-CM | POA: Diagnosis not present

## 2023-04-23 DIAGNOSIS — M6281 Muscle weakness (generalized): Secondary | ICD-10-CM | POA: Diagnosis not present

## 2023-04-23 DIAGNOSIS — I6340 Cerebral infarction due to embolism of unspecified cerebral artery: Secondary | ICD-10-CM | POA: Diagnosis not present

## 2023-04-23 DIAGNOSIS — E1122 Type 2 diabetes mellitus with diabetic chronic kidney disease: Secondary | ICD-10-CM | POA: Diagnosis not present

## 2023-04-23 DIAGNOSIS — I358 Other nonrheumatic aortic valve disorders: Secondary | ICD-10-CM | POA: Diagnosis not present

## 2023-04-27 DIAGNOSIS — M545 Low back pain, unspecified: Secondary | ICD-10-CM | POA: Diagnosis not present

## 2023-04-27 DIAGNOSIS — M25551 Pain in right hip: Secondary | ICD-10-CM | POA: Diagnosis not present

## 2023-04-27 DIAGNOSIS — R296 Repeated falls: Secondary | ICD-10-CM | POA: Diagnosis not present

## 2023-04-27 DIAGNOSIS — M25552 Pain in left hip: Secondary | ICD-10-CM | POA: Diagnosis not present

## 2023-04-28 DIAGNOSIS — I358 Other nonrheumatic aortic valve disorders: Secondary | ICD-10-CM | POA: Diagnosis not present

## 2023-04-28 DIAGNOSIS — E1122 Type 2 diabetes mellitus with diabetic chronic kidney disease: Secondary | ICD-10-CM | POA: Diagnosis not present

## 2023-04-28 DIAGNOSIS — I6340 Cerebral infarction due to embolism of unspecified cerebral artery: Secondary | ICD-10-CM | POA: Diagnosis not present

## 2023-04-28 DIAGNOSIS — M6281 Muscle weakness (generalized): Secondary | ICD-10-CM | POA: Diagnosis not present

## 2023-04-29 DIAGNOSIS — M6281 Muscle weakness (generalized): Secondary | ICD-10-CM | POA: Diagnosis not present

## 2023-04-29 DIAGNOSIS — E1122 Type 2 diabetes mellitus with diabetic chronic kidney disease: Secondary | ICD-10-CM | POA: Diagnosis not present

## 2023-04-29 DIAGNOSIS — I358 Other nonrheumatic aortic valve disorders: Secondary | ICD-10-CM | POA: Diagnosis not present

## 2023-04-29 DIAGNOSIS — I6340 Cerebral infarction due to embolism of unspecified cerebral artery: Secondary | ICD-10-CM | POA: Diagnosis not present

## 2023-04-30 DIAGNOSIS — M6281 Muscle weakness (generalized): Secondary | ICD-10-CM | POA: Diagnosis not present

## 2023-04-30 DIAGNOSIS — I358 Other nonrheumatic aortic valve disorders: Secondary | ICD-10-CM | POA: Diagnosis not present

## 2023-04-30 DIAGNOSIS — I6340 Cerebral infarction due to embolism of unspecified cerebral artery: Secondary | ICD-10-CM | POA: Diagnosis not present

## 2023-04-30 DIAGNOSIS — E1122 Type 2 diabetes mellitus with diabetic chronic kidney disease: Secondary | ICD-10-CM | POA: Diagnosis not present

## 2023-05-01 DIAGNOSIS — I10 Essential (primary) hypertension: Secondary | ICD-10-CM | POA: Diagnosis not present

## 2023-05-03 DIAGNOSIS — I6340 Cerebral infarction due to embolism of unspecified cerebral artery: Secondary | ICD-10-CM | POA: Diagnosis not present

## 2023-05-03 DIAGNOSIS — M6281 Muscle weakness (generalized): Secondary | ICD-10-CM | POA: Diagnosis not present

## 2023-05-03 DIAGNOSIS — I358 Other nonrheumatic aortic valve disorders: Secondary | ICD-10-CM | POA: Diagnosis not present

## 2023-05-03 DIAGNOSIS — E1122 Type 2 diabetes mellitus with diabetic chronic kidney disease: Secondary | ICD-10-CM | POA: Diagnosis not present

## 2023-05-04 DIAGNOSIS — E1122 Type 2 diabetes mellitus with diabetic chronic kidney disease: Secondary | ICD-10-CM | POA: Diagnosis not present

## 2023-05-04 DIAGNOSIS — M6281 Muscle weakness (generalized): Secondary | ICD-10-CM | POA: Diagnosis not present

## 2023-05-04 DIAGNOSIS — I358 Other nonrheumatic aortic valve disorders: Secondary | ICD-10-CM | POA: Diagnosis not present

## 2023-05-04 DIAGNOSIS — I6340 Cerebral infarction due to embolism of unspecified cerebral artery: Secondary | ICD-10-CM | POA: Diagnosis not present

## 2023-05-05 DIAGNOSIS — M6281 Muscle weakness (generalized): Secondary | ICD-10-CM | POA: Diagnosis not present

## 2023-05-05 DIAGNOSIS — I358 Other nonrheumatic aortic valve disorders: Secondary | ICD-10-CM | POA: Diagnosis not present

## 2023-05-05 DIAGNOSIS — I6340 Cerebral infarction due to embolism of unspecified cerebral artery: Secondary | ICD-10-CM | POA: Diagnosis not present

## 2023-05-05 DIAGNOSIS — E1122 Type 2 diabetes mellitus with diabetic chronic kidney disease: Secondary | ICD-10-CM | POA: Diagnosis not present

## 2023-05-14 ENCOUNTER — Other Ambulatory Visit (HOSPITAL_COMMUNITY): Payer: Self-pay

## 2023-05-17 ENCOUNTER — Other Ambulatory Visit: Payer: Self-pay

## 2023-05-17 ENCOUNTER — Encounter (HOSPITAL_COMMUNITY): Payer: Self-pay

## 2023-05-17 ENCOUNTER — Emergency Department (HOSPITAL_COMMUNITY)
Admission: EM | Admit: 2023-05-17 | Discharge: 2023-05-17 | Disposition: A | Discharge status: Home / Self Care | Payer: Medicare Other | Attending: Emergency Medicine | Admitting: Emergency Medicine

## 2023-05-17 ENCOUNTER — Emergency Department (HOSPITAL_COMMUNITY): Payer: Medicare Other

## 2023-05-17 DIAGNOSIS — F0390 Unspecified dementia without behavioral disturbance: Secondary | ICD-10-CM | POA: Insufficient documentation

## 2023-05-17 DIAGNOSIS — Z79899 Other long term (current) drug therapy: Secondary | ICD-10-CM | POA: Insufficient documentation

## 2023-05-17 DIAGNOSIS — W050XXA Fall from non-moving wheelchair, initial encounter: Secondary | ICD-10-CM | POA: Insufficient documentation

## 2023-05-17 DIAGNOSIS — Z7901 Long term (current) use of anticoagulants: Secondary | ICD-10-CM | POA: Insufficient documentation

## 2023-05-17 DIAGNOSIS — S0101XA Laceration without foreign body of scalp, initial encounter: Secondary | ICD-10-CM | POA: Diagnosis not present

## 2023-05-17 DIAGNOSIS — Z794 Long term (current) use of insulin: Secondary | ICD-10-CM | POA: Insufficient documentation

## 2023-05-17 DIAGNOSIS — Z7984 Long term (current) use of oral hypoglycemic drugs: Secondary | ICD-10-CM | POA: Diagnosis not present

## 2023-05-17 DIAGNOSIS — Y92129 Unspecified place in nursing home as the place of occurrence of the external cause: Secondary | ICD-10-CM | POA: Diagnosis not present

## 2023-05-17 DIAGNOSIS — W19XXXA Unspecified fall, initial encounter: Secondary | ICD-10-CM | POA: Diagnosis not present

## 2023-05-17 DIAGNOSIS — Z7401 Bed confinement status: Secondary | ICD-10-CM | POA: Diagnosis not present

## 2023-05-17 DIAGNOSIS — S0093XA Contusion of unspecified part of head, initial encounter: Secondary | ICD-10-CM | POA: Diagnosis not present

## 2023-05-17 DIAGNOSIS — I10 Essential (primary) hypertension: Secondary | ICD-10-CM | POA: Diagnosis not present

## 2023-05-17 DIAGNOSIS — S0990XA Unspecified injury of head, initial encounter: Secondary | ICD-10-CM | POA: Diagnosis not present

## 2023-05-17 MED ORDER — CLONIDINE HCL 0.2 MG PO TABS
0.2000 mg | ORAL_TABLET | Freq: Once | ORAL | Status: AC
  Administered 2023-05-17: 0.2 mg via ORAL
  Filled 2023-05-17: qty 1

## 2023-05-17 NOTE — ED Triage Notes (Signed)
Pt BIBGEMS from [facility] after having a fall. Supposed to be using a wheelchair but got up and walked. Hit head and has small laceration to above the right eye.   + eliquis - stroke 3 weeks ago and moved to rehab   HTN 240/110

## 2023-05-17 NOTE — ED Notes (Signed)
Patient noted to be in hall bed awaiting transport at this time. Denies any needs at this time when asked. Pt awake and alert with regular and unlabored respirations at this time.

## 2023-05-17 NOTE — ED Notes (Signed)
[facility] arrived, handoff report given with face sheet, discharge paperwork, and medical necessity forms. Pt awake and alert prior to departure.

## 2023-05-17 NOTE — ED Provider Notes (Signed)
[place] EMERGENCY DEPARTMENT AT [facility] Provider Note   CSN: [id number] Arrival date & time: 05/17/23  1522     History {Add pertinent medical, surgical, social history, OB history to HPI:1} Chief Complaint  Patient presents with   Ryan Duke is a 82 y.o. male on Eliquis presenting from nursing facility with a witnessed fall.  Patient is supposedly in a wheelchair at baseline but attempt to get up out of the wheelchair, loss of balance and fell and hit his head.  He comes by EMS.  The patient has dementia.  History of stroke 3 weeks ago for which she is at rehab currently.  HPI     Home Medications Prior to Admission medications   Medication Sig Start Date End Date Taking? Authorizing Provider  acetaminophen (TYLENOL) 325 MG tablet Take 650 mg by mouth every 6 (six) hours as needed for mild pain or moderate pain (Uses as needed).    [provider]  amLODipine (NORVASC) 10 MG tablet Take 1 tablet (10 mg total) by mouth daily. 04/01/23   Elgergawy, Leana Roe, MD  apixaban (ELIQUIS) 2.5 MG TABS tablet Take 1 tablet (2.5 mg total) by mouth 2 (two) times daily. 03/31/23   Elgergawy, Leana Roe, MD  atenolol (TENORMIN) 50 MG tablet Take 1 tablet (50 mg total) by mouth daily. 02/18/23   [name], MD  DULoxetine (CYMBALTA) 60 MG capsule TAKE ONE CAPSULE DAILY 11/09/22   [name], MD  fluticasone [facility]) 50 MCG/ACT nasal spray Place 1 spray into both nostrils daily as needed for allergies or rhinitis.    [provider]  insulin aspart (NOVOLOG) 100 UNIT/ML injection Inject 0-9 Units into the skin 3 (three) times daily with meals. 03/31/23   Elgergawy, Leana Roe, MD  insulin glargine (LANTUS SOLOSTAR) 100 UNIT/ML Solostar Pen Inject 22 Units into the skin daily. 03/31/23   Elgergawy, Leana Roe, MD  JARDIANCE 25 MG TABS tablet TAKE ONE TABLET BY MOUTH ONCE DAILY BEFORE BREAKFAST 01/11/23   [name], MD  KERENDIA 20 MG TABS Take 1 tablet  by mouth daily. 02/15/23   [name], MD  losartan (COZAAR) 100 MG tablet Take 1 tablet (100 mg total) by mouth daily for high blood pressure 09/25/22   [name], MD  Multiple Vitamin (MULTIVITAMIN WITH MINERALS) TABS tablet Take 1 tablet by mouth daily.    [provider]  Multiple Vitamins-Minerals (PRESERVISION AREDS PO) Take 1 tablet by mouth in the morning and at bedtime.    [provider]  rosuvastatin (CRESTOR) 20 MG tablet Take 1 tablet (20 mg total) by mouth daily. 04/01/23   Elgergawy, Leana Roe, MD  senna-docusate (SENOKOT-S) 8.6-50 MG tablet Take 1 tablet by mouth at bedtime as needed for mild constipation. 03/31/23   Elgergawy, Leana Roe, MD  tamsulosin (FLOMAX) 0.4 MG CAPS capsule TAKE ONE CAPSULE BY MOUTH DAILY 12/20/22   [name], MD  vitamin B-12 (CYANOCOBALAMIN) 500 MCG tablet Take 500 mcg by mouth daily.    [provider]      Allergies    Farxiga [dapagliflozin], Metformin and related, Atorvastatin, Benazepril hcl, Flexeril [cyclobenzaprine], and Oxycodone    Review of Systems   Review of Systems  Physical Exam Updated Vital Signs BP (!) 212/78  Physical Exam Constitutional:      General: He is not in acute distress. HENT:     Head: Normocephalic.      Comments:  Hematoma to the mid forehead with small laceration noted, bleeding controlled Eyes:     Conjunctiva/sclera: Conjunctivae normal.     Pupils: Pupils are equal, round, and reactive to light.  Cardiovascular:     Rate and Rhythm: Normal rate and regular rhythm.  Pulmonary:     Effort: Pulmonary effort is normal. No respiratory distress.  Abdominal:     General: There is no distension.     Tenderness: There is no abdominal tenderness.  Musculoskeletal:        General: No swelling, tenderness or deformity.     Cervical back: Normal range of motion and neck supple. No rigidity.  Skin:    General: Skin is warm and dry.  Neurological:     General: No focal deficit  present.     Mental Status: He is alert. Mental status is at baseline.     ED Results / Procedures / Treatments   Labs (all labs ordered are listed, but only abnormal results are displayed) Labs Reviewed - No data to display  EKG None  Radiology No results found.  Procedures Procedures  {Document cardiac monitor, telemetry assessment procedure when appropriate:1}  Medications Ordered in ED Medications  cloNIDine (CATAPRES) tablet 0.2 mg (has no administration in time range)    ED Course/ Medical Decision Making/ A&P Clinical Course as of 05/17/23 1529  Mon May 17, 2023  1528 BP was 212/78 manual, NOT 178 diastolic, confirmed with nursing and they will amend that vital sign [MT]    Clinical Course User Index [MT] Urian Martenson, Kermit Balo, MD   {   Click here for ABCD2, HEART and other calculatorsREFRESH Note before signing :1}                          Medical Decision Making Amount and/or Complexity of Data Reviewed Radiology: ordered.   Patient is presenting with a witnessed mechanical fall and isolated injury to the forehead, on Eliquis.  No other traumatic injuries noted on exam.  Patient is pleasantly demented appears to be at his baseline mental status.  CT imaging of the head was ordered persevered interpreted, showing ***  Supplement history provided by EMS at bedside.  Patient's small wound was irrigated, cleaned and closed using Dermabond glue.  He was given clonidine as a dose for hypertension likely 2/2 head injury  {Document critical care time when appropriate:1} {Document review of labs and clinical decision tools ie heart score, Chads2Vasc2 etc:1}  {Document your independent review of radiology images, and any outside records:1} {Document your discussion with family members, caretakers, and with consultants:1} {Document social determinants of health affecting pt's care:1} {Document your decision making why or why not admission, treatments were needed:1} Final  Clinical Impression(s) / ED Diagnoses Final diagnoses:  None    Rx / DC Orders ED Discharge Orders     None

## 2023-05-17 NOTE — Discharge Instructions (Signed)
Dermabond applied to scalp laceration.  This glue will gradually dissolve over the next several days.  Do not pick or pull at it.  You can shower normally.  The CT scan of the brain did not show any signs of skull fracture or brain bleed.  Patient's wife updated at bedside about his workup and status.

## 2023-05-17 NOTE — Progress Notes (Deleted)
[facility]   MULTIDISCIPLINARY HEART VALVE CLINIC                                     Cardiology Office Note:    Date:  05/17/2023   ID:  Ryan Duke, DOB 01-09-1941, MRN [id number]  PCP:  Eloisa Northern, MD  [facility] Cardiologist:  Tonny Bollman, MD  [facility] Electrophysiologist:  None   Referring MD: [name], MD   1 year s/p TAVR  History of Present Illness:    Ryan Duke is a 82 y.o. 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   He has a history of aortic stenosis. Echo 04/14/22 showed EF >75%, severe aortic stenosis with a mean grad 51 mmHg, peak grad 88.4 mmHg, AVA 0.91 cm2, DVI 0.28, SVI 51, mild AI. [facility] 05/07/22 showed widely patent coronary arteries with no significant stenoses and normal right heart pressures and hemodynamics. He had some problematic teeth and underwent dental extractions on 05/14/22.  He was evaluated by the multidisciplinary valve team and underwent successful TAVR with a 26 mm Edwards Sapien 3 Ultra Resilia THV x2 (given canting of first valve and AI) via the subclavian approach on 07/14/22. 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 Post operative echo showed EF 65%, normally functioning TAVR x2 with a mean gradient of 13 mmHg and trivial PVL.  He was started on Asprin and Plavix given double TAVR valve placement.   Recently admitted 4/25-03/31/23 for acute CVA c/w TAVR leaflet thrombosis and he was started on Eliquis 2.5 BID and discharged to a SNF.   Today the patient presents to clinic for follow up.     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 Excell Seltzer and Dr. Laneta Simmers   Severe aortic stenosis     Past Surgical History:  Procedure Laterality Date   ALVEOLOPLASTY N/A 05/14/2022   Procedure: ALVEOLOPLASTY;  Surgeon: Sharman Cheek, DMD;  Location: [facility] OR;  Service: Dentistry;  Laterality: N/A;   CARDIAC CATHETERIZATION     EYE SURGERY  2021   cataracts   FLEXIBLE SIGMOIDOSCOPY N/A 09/10/2017   Procedure: FLEXIBLE SIGMOIDOSCOPY;  Surgeon: Napoleon Form, MD;  Location: [facility] ENDOSCOPY;  Service: Endoscopy;  Laterality: N/A;   IR KYPHO LUMBAR INC FX REDUCE BONE BX UNI/BIL CANNULATION INC/IMAGING  09/14/2017   IR RADIOLOGIST EVAL & MGMT  10/07/2017   RIGHT HEART CATH AND CORONARY ANGIOGRAPHY N/A 05/07/2022   Procedure: RIGHT HEART CATH AND CORONARY ANGIOGRAPHY;  Surgeon: Tonny Bollman, MD;  Location: [facility] INVASIVE CV LAB;  Service: Cardiovascular;  Laterality: N/A;   TEE WITHOUT CARDIOVERSION N/A 07/14/2022   Procedure: TRANSESOPHAGEAL ECHOCARDIOGRAM (TEE);  Surgeon: Tonny Bollman, MD;  Location: [facility] INVASIVE CV LAB;  Service: Open Heart Surgery;  Laterality: N/A;   TONSILLECTOMY     TOOTH EXTRACTION N/A 05/14/2022   Procedure: DENTAL EXTRACTIONS TEETH NUMBER TWO, THREE, FOUR, FIVE, FOURTEEN, FIFTEEN, TWENTY;  Surgeon: Sharman Cheek, DMD;  Location: [facility] OR;  Service: Dentistry;  Laterality: N/A;    Current Medications: No outpatient medications have been marked as taking for the 05/19/23 encounter (Appointment) with [facility].     Allergies:   Farxiga [dapagliflozin], Metformin and related, Atorvastatin, Benazepril hcl, Flexeril [cyclobenzaprine], and Oxycodone   Social History   Socioeconomic History   Marital status: Married    Spouse name: Not on file   Number of children: Not on file   Years of education: 14   Highest education level: Some college, no degree  Occupational History   Occupation: retired  Tobacco Use   Smoking status: Former     Types: Cigarettes    Quit date: 11/30/1974    Years since quitting: 48.4    Passive exposure: Never   Smokeless tobacco: Never  Vaping Use   Vaping Use: Never used  Substance and Sexual Activity   Alcohol use: Not Currently    Alcohol/week: 0.0 standard drinks of alcohol   Drug use: No   Sexual activity: Yes    Partners: Female  Other Topics Concern   Not on file  Social History Narrative   Not on file   Social Determinants of Health   Financial Resource Strain: Low Risk  (03/02/2022)   Overall Financial Resource Strain (CARDIA)    Difficulty of Paying Living Expenses: Not hard at all  Food Insecurity: No Food Insecurity (03/02/2022)   Hunger Vital Sign    Worried About Running Out of Food in the Last Year: Never true    Ran Out of Food in the Last Year: Never true  Transportation Needs: No Transportation Needs (03/02/2022)   PRAPARE - Administrator, Civil Service (Medical): No    Lack of Transportation (Non-Medical): No  Physical Activity: Insufficiently Active (03/02/2022)   Exercise Vital Sign    Days of Exercise per Week: 2 days    Minutes of Exercise per Session: 20 min  Stress: No Stress Concern Present (03/02/2022)   [employer] of Occupational Health - Occupational Stress Questionnaire    Feeling of Stress : Not at all  Social Connections: Moderately Isolated (03/02/2022)   Social Connection and Isolation Panel [NHANES]    Frequency of Communication with Friends and Family: Twice a week    Frequency of Social Gatherings with Friends and Family: Twice a week    Attends Religious Services: Never    Database administrator or Organizations: No    Attends Engineer, structural: Never    Marital Status: Married     Family History: The patient's family history includes Hypertension in an other family member.  ROS:   Please see the history of present illness.    All other systems reviewed and are negative.  EKGs/Labs/Other Studies Reviewed:     The following studies were reviewed today:  TAVR OPERATIVE NOTE     Date of Procedure:                07/14/2022   Preoperative Diagnosis:      Severe Aortic Stenosis    Postoperative Diagnosis:    Same    Procedure:        Transcatheter Aortic Valve Replacement - Left Subclavian Artery Approach             Edwards Sapien 3 Ultra Resilia THV (size 26 mm, model # 9755RSL, serial # [id number])  Co-Surgeons:                        Alleen Borne, MD and Tonny Bollman, MD   Anesthesiologist:                  Dot Lanes, MD   Echocardiographer:              Lacretia Nicks. O'Neal, MD   Pre-operative Echo Findings: Severe aortic stenosis Normal left ventricular systolic function   Post-operative Echo Findings: Mild  paravalvular leak Normal left ventricular systolic function _____________     Echo 07/15/22: IMPRESSIONS   1. S/p 26 mm S3 x 2 TAVR (ViV performed immediately during the procedure  due to paravalvular leak). Vmax 2.5 m/s, MG 13 mmHG, EOA 2.83 cm2, DI  0.68. Trivial paravalvular leak noted. The aortic valve has been  repaired/replaced. Aortic valve regurgitation   is trivial. There is a 26 mm Edwards Sapien prosthetic (TAVR) valve  present in the aortic position. Procedure Date: 07/14/22. Echo findings are  consistent with normal structure and function of the aortic valve  prosthesis.   2. Left ventricular ejection fraction, by estimation, is 65 to 70%. The  left ventricle has normal function. The left ventricle has no regional  wall motion abnormalities. There is moderate concentric left ventricular  hypertrophy. Left ventricular  diastolic parameters are consistent with Grade I diastolic dysfunction  (impaired relaxation).   3. Right ventricular systolic function is normal. The right ventricular  size is normal. Tricuspid regurgitation signal is inadequate for assessing  PA pressure.   4. Left atrial size was moderately dilated.   5. The mitral valve is grossly  normal. Trivial mitral valve  regurgitation. No evidence of mitral stenosis.   6. The inferior vena cava is normal in size with greater than 50%  respiratory variability, suggesting right atrial pressure of 3 mmHg.   ___________________  Echo 08/19/22 IMPRESSIONS  1. Left ventricular ejection fraction, by estimation, is 55 to 60%. The left ventricle has normal function. The left ventricle has no regional wall motion abnormalities. There is mild left ventricular hypertrophy. Left ventricular diastolic parameters  are consistent with Grade I diastolic dysfunction (impaired relaxation).  2. Right ventricular systolic function is normal. The right ventricular size is normal. Tricuspid regurgitation signal is inadequate for assessing PA pressure.  3. The mitral valve is normal in structure. No evidence of mitral valve regurgitation. No evidence of mitral stenosis.  4. Bioprosthetic aortic valve s/p TAVR. 26 mm Edwards Sapien 3 Ultra Resilia THV. Mean gradient 9 mmHg, EOA 1.94 cm^2. Mild perivalvular regurgitation.  5. Aortic dilatation noted. There is mild dilatation of the ascending aorta, measuring 39 mm.  6. The inferior vena cava is normal in size with greater than 50% respiratory variability, suggesting right atrial pressure of 3 mmHg.  ____________________  Echo 05/19/23 ***    EKG:  EKG is NOT ordered today.    Recent Labs: 03/25/2023: ALT 13 03/27/2023: TSH 4.606 03/31/2023: B Natriuretic Peptide 693.0; BUN 22; Creatinine, Ser 1.37; Hemoglobin 15.1; Magnesium 2.1; Platelets 171; Potassium 3.4; Sodium 137  Recent Lipid Panel    Component Value Date/Time   CHOL 156 03/25/2023 1145   TRIG 75 03/25/2023 1145   TRIG 171 (H) 10/06/2006 0741   HDL 45 03/25/2023 1145   CHOLHDL 3.5 03/25/2023 1145   VLDL 15 03/25/2023 1145   LDLCALC 96 03/25/2023 1145   LDLDIRECT 81.0 05/26/2018 1547     Risk  Assessment/Calculations:       Physical Exam:    VS:  There were no vitals taken for  this visit.    Wt Readings from Last 3 Encounters:  05/17/23 (!) 381 lb 6.3 oz (173 kg)  03/31/23 (!) 380 lb 11.8 oz (172.7 kg)  03/04/23 226 lb (102.5 kg)     GEN:  Well nourished, well developed in no acute distress HEENT: Normal NECK: No JVD LYMPHATICS: No lymphadenopathy CARDIAC: RRR, soft flow murmur @ RUSB. No rubs, gallops RESPIRATORY:  Clear to auscultation without rales, wheezing or rhonchi  ABDOMEN: Soft, non-tender, non-distended MUSCULOSKELETAL:  No edema; No deformity  SKIN: Warm and dry.  NEUROLOGIC:  Alert and oriented x 3 PSYCHIATRIC:  Normal affect   ASSESSMENT:    No diagnosis found.   PLAN:    In order of problems listed above:  Severe AS s/p TAVR: echo today shows EF 65%, normally functioning TAVR with a mean gradient of 9 mm hg and mild PVL. He has NYHA class II symptoms, mostly of fatigue. Continue on DAPT with aspirin x 6months followed by Asprin alone (placed on DAPT given TAVR in TAVR placement- see HPI). SBE prophylaxis discussed; I have RX'd amoxicillin. Encouraged exercise for energy. He is anxious to start CRPII. I will see him back for 1 year follow up and echo.   HTN: BP well controlled today. No changes made.   CKD stage IIIb: creat 1.55 last check which is around his baseline.   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 Recommend follow-up ultrasound every 5 years. Will defer to primary cardiologist.    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 No overtly suspicious CT features. MRI abdomen without and with IV contrast may be considered for further evaluation. Will defer to PCP.    Pulmonary nodules: a few scattered small solid pulmonary nodules, largest 0.4 cm. No follow-up needed if patient is low-risk (and has no known or suspected primary neoplasm).  Non-contrast chest CT can be considered in 12 months if patient is high-risk. I do not think he is high risk but he is a former smoker, so we will get this set up at the 1 year visit.    Medication Adjustments/Labs and Tests Ordered: Current medicines are reviewed at length with the patient today.  Concerns regarding medicines are outlined above.  No orders of the defined types were placed in this encounter.  No orders of the defined types were placed in this encounter.   There are no Patient Instructions on file for this visit.   Signed, Cline Crock, PA-C  05/17/2023 4:04 PM    [facility]

## 2023-05-17 NOTE — Progress Notes (Signed)
   05/17/23 1546  Spiritual Encounters  Type of Visit Initial  Care provided to: Patient;Pt and family  Conversation partners present during encounter Nurse  Referral source Trauma page  Reason for visit Trauma  OnCall Visit No   Chaplain responded to Fall on [place] call. PT was alert when coming in and he told me his wife or his sister-in-law would be coming.  Chaplain met sister-in-law in waiting area and conducted her to bedside.

## 2023-05-19 ENCOUNTER — Ambulatory Visit (HOSPITAL_COMMUNITY): Payer: Medicare Other

## 2023-05-19 ENCOUNTER — Ambulatory Visit: Payer: Medicare Other

## 2023-05-19 DIAGNOSIS — N390 Urinary tract infection, site not specified: Secondary | ICD-10-CM | POA: Diagnosis not present

## 2023-05-20 ENCOUNTER — Telehealth: Payer: Self-pay | Admitting: *Deleted

## 2023-05-20 NOTE — Transitions of Care (Post Inpatient/ED Visit) (Signed)
05/20/2023  Name: Ryan Duke MRN: [id number] DOB: Sep 03, 1941  Today's TOC FU Call Status: Today's TOC FU Call Status:: Successful TOC FU Call Competed TOC FU Call Complete Date: 05/20/23  ED EMMI Red Alert notification on 05/20/23 from ED visit 05/17/23- EMMI call placed 05/19/23: "No scheduled follow up"  "No discharge instructions"  Transition Care Management Follow-up Telephone Call Date of Discharge: 05/17/23 Discharge Facility: [facility]) Type of Discharge: Emergency Department Reason for ED Visit: Other: (mechanical fall in setting of ACT- patient now residing at SNF full time per spouse report today: [name]) How have you been since you were released from the hospital?: Same (per spouse: "He is no longer at the short term facility after his hospital visit earlier this spring; we had him moved permanently into [facility] after that short term visit.  Everythng seems to be fine after the ER visit.  I visit him every day") Any questions or concerns?: No  Verified with spouse that patient was transferred back to the permanent SNF he was recently transferred to: [facility]; TOC call modified accordingly  Items Reviewed: Did you receive and understand the discharge instructions provided?: Yes Medications obtained,verified, and reconciled?: No Medications Not Reviewed Reasons:: Other: (spouse reports patient now residing at long term SNF- permanently) Any new allergies since your discharge?: No Dietary orders reviewed?: No Do you have support at home?:  (confirmed patient now resides at permanent long term SNF)  Medications Reviewed Today: Medications Reviewed Today     Reviewed by Michaela Corner, RN (Registered Nurse) on 05/20/23 at 1527  Med List Status: <None>   Medication Order Taking? Sig Documenting Provider Last Dose Status Informant  acetaminophen (TYLENOL) 325 MG tablet [id number]  Take 650 mg by mouth every 6 (six) hours as needed for mild pain or moderate  pain (Uses as needed). [provider]  Active Self, Spouse/Significant Other  amLODipine (NORVASC) 10 MG tablet [id number]  Take 1 tablet (10 mg total) by mouth daily. Elgergawy, Leana Roe, MD  Active   apixaban (ELIQUIS) 2.5 MG TABS tablet [id number]  Take 1 tablet (2.5 mg total) by mouth 2 (two) times daily. Elgergawy, Leana Roe, MD  Active   atenolol (TENORMIN) 50 MG tablet [id number] No Take 1 tablet (50 mg total) by mouth daily. [name], MD Past Week Active   DULoxetine (CYMBALTA) 60 MG capsule [id number] No TAKE ONE CAPSULE DAILY [name], MD Past Week Active   fluticasone (FLONASE) 50 MCG/ACT nasal spray [id number] No Place 1 spray into both nostrils daily as needed for allergies or rhinitis. [provider] Past Month Active Self  insulin aspart (NOVOLOG) 100 UNIT/ML injection [id number]  Inject 0-9 Units into the skin 3 (three) times daily with meals. Elgergawy, Leana Roe, MD  Active   insulin glargine (LANTUS SOLOSTAR) 100 UNIT/ML Solostar Pen [id number]  Inject 22 Units into the skin daily. Elgergawy, Leana Roe, MD  Active   JARDIANCE 25 MG TABS tablet [id number] No TAKE ONE TABLET BY MOUTH ONCE DAILY BEFORE Drucie Opitz, MD Past Week Active   KERENDIA 20 MG TABS [id number] No Take 1 tablet by mouth daily. [name], MD Past Week Active   losartan (COZAAR) 100 MG tablet [id number] No Take 1 tablet (100 mg total) by mouth daily for high blood pressure [name], MD Past Week 10:00am Active   Multiple Vitamin (MULTIVITAMIN WITH MINERALS) TABS tablet [id number] No Take 1 tablet by mouth daily. [provider] Past Week Active Self  Multiple Vitamins-Minerals (PRESERVISION AREDS PO) [id number] No Take 1 tablet by mouth in the morning and at bedtime. [provider] Past Week Active Self  rosuvastatin (CRESTOR) 20 MG tablet [id number]  Take 1 tablet (20 mg total) by mouth daily. Elgergawy, Leana Roe, MD  Active   senna-docusate  (SENOKOT-S) 8.6-50 MG tablet [id number]  Take 1 tablet by mouth at bedtime as needed for mild constipation. Elgergawy, Leana Roe, MD  Active   tamsulosin (FLOMAX) 0.4 MG CAPS capsule [id number] No TAKE ONE CAPSULE BY MOUTH DAILY [name], MD Past Week Active   vitamin B-12 (CYANOCOBALAMIN) 500 MCG tablet [id number] No Take 500 mcg by mouth daily. [provider] Past Week Active Self           Home Care and Equipment/Supplies: Were Home Health Services Ordered?: NA Any new equipment or medical supplies ordered?: NA  Functional Questionnaire:   Follow up appointments reviewed: PCP Follow-up appointment confirmed?: NA Specialist Hospital Follow-up appointment confirmed?: NA Do you need transportation to your follow-up appointment?: No   TOC Interventions Today    Flowsheet Row Most Recent Value  TOC Interventions   TOC Interventions Discussed/Reviewed TOC Interventions Discussed      Interventions Today    Flowsheet Row Most Recent Value  Chronic Disease   Chronic disease during today's visit Other  [mechanical fall in setting of ACT]  General Interventions   General Interventions Discussed/Reviewed General Interventions Discussed  Safety Interventions   Safety Discussed/Reviewed Safety Discussed      Caryl Pina, RN, BSN, CCRN Alumnus RN CM Care Coordination/ Transition of Care- [facility] Care Management [phone]: direct office

## 2023-05-28 DIAGNOSIS — M6281 Muscle weakness (generalized): Secondary | ICD-10-CM | POA: Diagnosis not present

## 2023-05-28 DIAGNOSIS — I6340 Cerebral infarction due to embolism of unspecified cerebral artery: Secondary | ICD-10-CM | POA: Diagnosis not present

## 2023-05-28 DIAGNOSIS — I358 Other nonrheumatic aortic valve disorders: Secondary | ICD-10-CM | POA: Diagnosis not present

## 2023-05-28 DIAGNOSIS — E1122 Type 2 diabetes mellitus with diabetic chronic kidney disease: Secondary | ICD-10-CM | POA: Diagnosis not present

## 2023-05-31 DIAGNOSIS — R296 Repeated falls: Secondary | ICD-10-CM | POA: Diagnosis not present

## 2023-06-01 DIAGNOSIS — E1122 Type 2 diabetes mellitus with diabetic chronic kidney disease: Secondary | ICD-10-CM | POA: Diagnosis not present

## 2023-06-01 DIAGNOSIS — M6281 Muscle weakness (generalized): Secondary | ICD-10-CM | POA: Diagnosis not present

## 2023-06-01 DIAGNOSIS — I10 Essential (primary) hypertension: Secondary | ICD-10-CM | POA: Diagnosis not present

## 2023-06-01 DIAGNOSIS — N1831 Chronic kidney disease, stage 3a: Secondary | ICD-10-CM | POA: Diagnosis not present

## 2023-06-10 ENCOUNTER — Inpatient Hospital Stay: Payer: Self-pay | Admitting: Adult Health

## 2023-06-10 DIAGNOSIS — M6281 Muscle weakness (generalized): Secondary | ICD-10-CM | POA: Diagnosis not present

## 2023-06-10 DIAGNOSIS — R2689 Other abnormalities of gait and mobility: Secondary | ICD-10-CM | POA: Diagnosis not present

## 2023-06-10 DIAGNOSIS — I639 Cerebral infarction, unspecified: Secondary | ICD-10-CM | POA: Diagnosis not present

## 2023-06-10 DIAGNOSIS — R278 Other lack of coordination: Secondary | ICD-10-CM | POA: Diagnosis not present

## 2023-06-15 ENCOUNTER — Telehealth: Payer: Self-pay | Admitting: Radiology

## 2023-06-15 DIAGNOSIS — D649 Anemia, unspecified: Secondary | ICD-10-CM | POA: Diagnosis not present

## 2023-06-15 DIAGNOSIS — R0989 Other specified symptoms and signs involving the circulatory and respiratory systems: Secondary | ICD-10-CM | POA: Diagnosis not present

## 2023-06-15 DIAGNOSIS — Z8616 Personal history of COVID-19: Secondary | ICD-10-CM | POA: Diagnosis not present

## 2023-06-15 DIAGNOSIS — R051 Acute cough: Secondary | ICD-10-CM | POA: Diagnosis not present

## 2023-06-15 DIAGNOSIS — R059 Cough, unspecified: Secondary | ICD-10-CM | POA: Diagnosis not present

## 2023-06-15 NOTE — Telephone Encounter (Signed)
Contacted Lum Keas Tsui to schedule their annual wellness visit. Call back at later date: 06/29/23  Graylon Good CMA

## 2023-06-17 DIAGNOSIS — Z8616 Personal history of COVID-19: Secondary | ICD-10-CM | POA: Diagnosis not present

## 2023-06-17 DIAGNOSIS — R051 Acute cough: Secondary | ICD-10-CM | POA: Diagnosis not present

## 2023-06-23 DIAGNOSIS — R296 Repeated falls: Secondary | ICD-10-CM | POA: Diagnosis not present

## 2023-06-24 DIAGNOSIS — E1122 Type 2 diabetes mellitus with diabetic chronic kidney disease: Secondary | ICD-10-CM | POA: Diagnosis not present

## 2023-06-24 DIAGNOSIS — M6281 Muscle weakness (generalized): Secondary | ICD-10-CM | POA: Diagnosis not present

## 2023-06-24 DIAGNOSIS — I10 Essential (primary) hypertension: Secondary | ICD-10-CM | POA: Diagnosis not present

## 2023-06-24 DIAGNOSIS — N1831 Chronic kidney disease, stage 3a: Secondary | ICD-10-CM | POA: Diagnosis not present

## 2023-06-25 DIAGNOSIS — I509 Heart failure, unspecified: Secondary | ICD-10-CM | POA: Diagnosis not present

## 2023-06-25 DIAGNOSIS — Z8616 Personal history of COVID-19: Secondary | ICD-10-CM | POA: Diagnosis not present

## 2023-06-25 DIAGNOSIS — R051 Acute cough: Secondary | ICD-10-CM | POA: Diagnosis not present

## 2023-06-25 DIAGNOSIS — R0989 Other specified symptoms and signs involving the circulatory and respiratory systems: Secondary | ICD-10-CM | POA: Diagnosis not present

## 2023-06-25 DIAGNOSIS — R059 Cough, unspecified: Secondary | ICD-10-CM | POA: Diagnosis not present

## 2023-06-25 DIAGNOSIS — R062 Wheezing: Secondary | ICD-10-CM | POA: Diagnosis not present

## 2023-06-28 DIAGNOSIS — J159 Unspecified bacterial pneumonia: Secondary | ICD-10-CM | POA: Diagnosis not present

## 2023-06-28 DIAGNOSIS — R7989 Other specified abnormal findings of blood chemistry: Secondary | ICD-10-CM | POA: Diagnosis not present

## 2023-06-28 DIAGNOSIS — I509 Heart failure, unspecified: Secondary | ICD-10-CM | POA: Diagnosis not present

## 2023-06-28 DIAGNOSIS — E1122 Type 2 diabetes mellitus with diabetic chronic kidney disease: Secondary | ICD-10-CM | POA: Diagnosis not present

## 2023-06-28 DIAGNOSIS — R296 Repeated falls: Secondary | ICD-10-CM | POA: Diagnosis not present

## 2023-06-28 DIAGNOSIS — I6340 Cerebral infarction due to embolism of unspecified cerebral artery: Secondary | ICD-10-CM | POA: Diagnosis not present

## 2023-06-28 DIAGNOSIS — R062 Wheezing: Secondary | ICD-10-CM | POA: Diagnosis not present

## 2023-07-02 DIAGNOSIS — E119 Type 2 diabetes mellitus without complications: Secondary | ICD-10-CM | POA: Diagnosis not present

## 2023-07-02 DIAGNOSIS — R278 Other lack of coordination: Secondary | ICD-10-CM | POA: Diagnosis not present

## 2023-07-02 DIAGNOSIS — E785 Hyperlipidemia, unspecified: Secondary | ICD-10-CM | POA: Diagnosis not present

## 2023-07-02 DIAGNOSIS — M6259 Muscle wasting and atrophy, not elsewhere classified, multiple sites: Secondary | ICD-10-CM | POA: Diagnosis not present

## 2023-07-02 DIAGNOSIS — N1831 Chronic kidney disease, stage 3a: Secondary | ICD-10-CM | POA: Diagnosis not present

## 2023-07-02 DIAGNOSIS — Z9181 History of falling: Secondary | ICD-10-CM | POA: Diagnosis not present

## 2023-07-02 DIAGNOSIS — I639 Cerebral infarction, unspecified: Secondary | ICD-10-CM | POA: Diagnosis not present

## 2023-07-04 DIAGNOSIS — E785 Hyperlipidemia, unspecified: Secondary | ICD-10-CM | POA: Diagnosis not present

## 2023-07-04 DIAGNOSIS — M6259 Muscle wasting and atrophy, not elsewhere classified, multiple sites: Secondary | ICD-10-CM | POA: Diagnosis not present

## 2023-07-04 DIAGNOSIS — I639 Cerebral infarction, unspecified: Secondary | ICD-10-CM | POA: Diagnosis not present

## 2023-07-04 DIAGNOSIS — R278 Other lack of coordination: Secondary | ICD-10-CM | POA: Diagnosis not present

## 2023-07-04 DIAGNOSIS — N1831 Chronic kidney disease, stage 3a: Secondary | ICD-10-CM | POA: Diagnosis not present

## 2023-07-04 DIAGNOSIS — E119 Type 2 diabetes mellitus without complications: Secondary | ICD-10-CM | POA: Diagnosis not present

## 2023-07-04 DIAGNOSIS — Z9181 History of falling: Secondary | ICD-10-CM | POA: Diagnosis not present

## 2023-07-05 DIAGNOSIS — E785 Hyperlipidemia, unspecified: Secondary | ICD-10-CM | POA: Diagnosis not present

## 2023-07-05 DIAGNOSIS — R278 Other lack of coordination: Secondary | ICD-10-CM | POA: Diagnosis not present

## 2023-07-05 DIAGNOSIS — E119 Type 2 diabetes mellitus without complications: Secondary | ICD-10-CM | POA: Diagnosis not present

## 2023-07-05 DIAGNOSIS — N1831 Chronic kidney disease, stage 3a: Secondary | ICD-10-CM | POA: Diagnosis not present

## 2023-07-05 DIAGNOSIS — M6259 Muscle wasting and atrophy, not elsewhere classified, multiple sites: Secondary | ICD-10-CM | POA: Diagnosis not present

## 2023-07-05 DIAGNOSIS — Z9181 History of falling: Secondary | ICD-10-CM | POA: Diagnosis not present

## 2023-07-05 DIAGNOSIS — I639 Cerebral infarction, unspecified: Secondary | ICD-10-CM | POA: Diagnosis not present

## 2023-07-06 DIAGNOSIS — I6340 Cerebral infarction due to embolism of unspecified cerebral artery: Secondary | ICD-10-CM | POA: Diagnosis not present

## 2023-07-06 DIAGNOSIS — E785 Hyperlipidemia, unspecified: Secondary | ICD-10-CM | POA: Diagnosis not present

## 2023-07-06 DIAGNOSIS — R278 Other lack of coordination: Secondary | ICD-10-CM | POA: Diagnosis not present

## 2023-07-06 DIAGNOSIS — Z9181 History of falling: Secondary | ICD-10-CM | POA: Diagnosis not present

## 2023-07-06 DIAGNOSIS — I639 Cerebral infarction, unspecified: Secondary | ICD-10-CM | POA: Diagnosis not present

## 2023-07-06 DIAGNOSIS — E119 Type 2 diabetes mellitus without complications: Secondary | ICD-10-CM | POA: Diagnosis not present

## 2023-07-06 DIAGNOSIS — E1122 Type 2 diabetes mellitus with diabetic chronic kidney disease: Secondary | ICD-10-CM | POA: Diagnosis not present

## 2023-07-06 DIAGNOSIS — M6259 Muscle wasting and atrophy, not elsewhere classified, multiple sites: Secondary | ICD-10-CM | POA: Diagnosis not present

## 2023-07-06 DIAGNOSIS — M6281 Muscle weakness (generalized): Secondary | ICD-10-CM | POA: Diagnosis not present

## 2023-07-06 DIAGNOSIS — N1831 Chronic kidney disease, stage 3a: Secondary | ICD-10-CM | POA: Diagnosis not present

## 2023-07-07 DIAGNOSIS — E119 Type 2 diabetes mellitus without complications: Secondary | ICD-10-CM | POA: Diagnosis not present

## 2023-07-07 DIAGNOSIS — I639 Cerebral infarction, unspecified: Secondary | ICD-10-CM | POA: Diagnosis not present

## 2023-07-07 DIAGNOSIS — E785 Hyperlipidemia, unspecified: Secondary | ICD-10-CM | POA: Diagnosis not present

## 2023-07-07 DIAGNOSIS — R278 Other lack of coordination: Secondary | ICD-10-CM | POA: Diagnosis not present

## 2023-07-07 DIAGNOSIS — M6259 Muscle wasting and atrophy, not elsewhere classified, multiple sites: Secondary | ICD-10-CM | POA: Diagnosis not present

## 2023-07-07 DIAGNOSIS — Z9181 History of falling: Secondary | ICD-10-CM | POA: Diagnosis not present

## 2023-07-07 DIAGNOSIS — N1831 Chronic kidney disease, stage 3a: Secondary | ICD-10-CM | POA: Diagnosis not present

## 2023-07-08 DIAGNOSIS — E119 Type 2 diabetes mellitus without complications: Secondary | ICD-10-CM | POA: Diagnosis not present

## 2023-07-08 DIAGNOSIS — Z9181 History of falling: Secondary | ICD-10-CM | POA: Diagnosis not present

## 2023-07-08 DIAGNOSIS — N1831 Chronic kidney disease, stage 3a: Secondary | ICD-10-CM | POA: Diagnosis not present

## 2023-07-08 DIAGNOSIS — I639 Cerebral infarction, unspecified: Secondary | ICD-10-CM | POA: Diagnosis not present

## 2023-07-08 DIAGNOSIS — M6259 Muscle wasting and atrophy, not elsewhere classified, multiple sites: Secondary | ICD-10-CM | POA: Diagnosis not present

## 2023-07-08 DIAGNOSIS — E785 Hyperlipidemia, unspecified: Secondary | ICD-10-CM | POA: Diagnosis not present

## 2023-07-08 DIAGNOSIS — R278 Other lack of coordination: Secondary | ICD-10-CM | POA: Diagnosis not present

## 2023-07-09 DIAGNOSIS — M6259 Muscle wasting and atrophy, not elsewhere classified, multiple sites: Secondary | ICD-10-CM | POA: Diagnosis not present

## 2023-07-09 DIAGNOSIS — Z9181 History of falling: Secondary | ICD-10-CM | POA: Diagnosis not present

## 2023-07-09 DIAGNOSIS — R278 Other lack of coordination: Secondary | ICD-10-CM | POA: Diagnosis not present

## 2023-07-09 DIAGNOSIS — N1831 Chronic kidney disease, stage 3a: Secondary | ICD-10-CM | POA: Diagnosis not present

## 2023-07-09 DIAGNOSIS — I639 Cerebral infarction, unspecified: Secondary | ICD-10-CM | POA: Diagnosis not present

## 2023-07-09 DIAGNOSIS — E785 Hyperlipidemia, unspecified: Secondary | ICD-10-CM | POA: Diagnosis not present

## 2023-07-09 DIAGNOSIS — E119 Type 2 diabetes mellitus without complications: Secondary | ICD-10-CM | POA: Diagnosis not present

## 2023-07-12 ENCOUNTER — Telehealth (HOSPITAL_COMMUNITY): Payer: Self-pay | Admitting: Physician Assistant

## 2023-07-12 ENCOUNTER — Other Ambulatory Visit: Payer: Self-pay | Admitting: Physician Assistant

## 2023-07-12 NOTE — Telephone Encounter (Signed)
Patient had recent strokes and doing quite poorly. He would need to be brought to his appt via ambulance. Echo and OV have been cancelled which is appropriate at this time. We will not see him for his 1 year TAVR follow up.    Cline Crock PA-C  [facility]

## 2023-07-12 NOTE — Telephone Encounter (Signed)
Patient cancelled echo and MD appt by automated system. I called patient to confirm and patient did wish to cancel both appointments.

## 2023-07-12 NOTE — Progress Notes (Deleted)
Patient had recent strokes and doing quite poorly. He would need to be brought to his appt via ambulance. Echo and OV have been cancelled which is appropriate at this time. We will not see him for his 1 year TAVR follow up.    Cline Crock PA-C  [facility]

## 2023-07-13 DIAGNOSIS — R278 Other lack of coordination: Secondary | ICD-10-CM | POA: Diagnosis not present

## 2023-07-13 DIAGNOSIS — Z9181 History of falling: Secondary | ICD-10-CM | POA: Diagnosis not present

## 2023-07-13 DIAGNOSIS — E785 Hyperlipidemia, unspecified: Secondary | ICD-10-CM | POA: Diagnosis not present

## 2023-07-13 DIAGNOSIS — M6259 Muscle wasting and atrophy, not elsewhere classified, multiple sites: Secondary | ICD-10-CM | POA: Diagnosis not present

## 2023-07-13 DIAGNOSIS — N1831 Chronic kidney disease, stage 3a: Secondary | ICD-10-CM | POA: Diagnosis not present

## 2023-07-13 DIAGNOSIS — I639 Cerebral infarction, unspecified: Secondary | ICD-10-CM | POA: Diagnosis not present

## 2023-07-13 DIAGNOSIS — E119 Type 2 diabetes mellitus without complications: Secondary | ICD-10-CM | POA: Diagnosis not present

## 2023-07-14 ENCOUNTER — Ambulatory Visit: Payer: Medicare Other

## 2023-07-14 ENCOUNTER — Ambulatory Visit (HOSPITAL_COMMUNITY): Payer: Medicare Other

## 2023-07-14 ENCOUNTER — Other Ambulatory Visit (HOSPITAL_COMMUNITY): Payer: Medicare Other

## 2023-07-14 DIAGNOSIS — N1831 Chronic kidney disease, stage 3a: Secondary | ICD-10-CM | POA: Diagnosis not present

## 2023-07-14 DIAGNOSIS — I639 Cerebral infarction, unspecified: Secondary | ICD-10-CM | POA: Diagnosis not present

## 2023-07-14 DIAGNOSIS — R278 Other lack of coordination: Secondary | ICD-10-CM | POA: Diagnosis not present

## 2023-07-14 DIAGNOSIS — E119 Type 2 diabetes mellitus without complications: Secondary | ICD-10-CM | POA: Diagnosis not present

## 2023-07-14 DIAGNOSIS — Z9181 History of falling: Secondary | ICD-10-CM | POA: Diagnosis not present

## 2023-07-14 DIAGNOSIS — M6259 Muscle wasting and atrophy, not elsewhere classified, multiple sites: Secondary | ICD-10-CM | POA: Diagnosis not present

## 2023-07-14 DIAGNOSIS — E785 Hyperlipidemia, unspecified: Secondary | ICD-10-CM | POA: Diagnosis not present

## 2023-07-15 DIAGNOSIS — E785 Hyperlipidemia, unspecified: Secondary | ICD-10-CM | POA: Diagnosis not present

## 2023-07-15 DIAGNOSIS — E119 Type 2 diabetes mellitus without complications: Secondary | ICD-10-CM | POA: Diagnosis not present

## 2023-07-15 DIAGNOSIS — M6259 Muscle wasting and atrophy, not elsewhere classified, multiple sites: Secondary | ICD-10-CM | POA: Diagnosis not present

## 2023-07-15 DIAGNOSIS — N1831 Chronic kidney disease, stage 3a: Secondary | ICD-10-CM | POA: Diagnosis not present

## 2023-07-15 DIAGNOSIS — R278 Other lack of coordination: Secondary | ICD-10-CM | POA: Diagnosis not present

## 2023-07-15 DIAGNOSIS — I639 Cerebral infarction, unspecified: Secondary | ICD-10-CM | POA: Diagnosis not present

## 2023-07-15 DIAGNOSIS — Z9181 History of falling: Secondary | ICD-10-CM | POA: Diagnosis not present

## 2023-07-16 DIAGNOSIS — Z9181 History of falling: Secondary | ICD-10-CM | POA: Diagnosis not present

## 2023-07-16 DIAGNOSIS — E119 Type 2 diabetes mellitus without complications: Secondary | ICD-10-CM | POA: Diagnosis not present

## 2023-07-16 DIAGNOSIS — N1831 Chronic kidney disease, stage 3a: Secondary | ICD-10-CM | POA: Diagnosis not present

## 2023-07-16 DIAGNOSIS — E785 Hyperlipidemia, unspecified: Secondary | ICD-10-CM | POA: Diagnosis not present

## 2023-07-16 DIAGNOSIS — M6259 Muscle wasting and atrophy, not elsewhere classified, multiple sites: Secondary | ICD-10-CM | POA: Diagnosis not present

## 2023-07-16 DIAGNOSIS — R278 Other lack of coordination: Secondary | ICD-10-CM | POA: Diagnosis not present

## 2023-07-16 DIAGNOSIS — I639 Cerebral infarction, unspecified: Secondary | ICD-10-CM | POA: Diagnosis not present

## 2023-07-19 DIAGNOSIS — E785 Hyperlipidemia, unspecified: Secondary | ICD-10-CM | POA: Diagnosis not present

## 2023-07-19 DIAGNOSIS — R278 Other lack of coordination: Secondary | ICD-10-CM | POA: Diagnosis not present

## 2023-07-19 DIAGNOSIS — Z9181 History of falling: Secondary | ICD-10-CM | POA: Diagnosis not present

## 2023-07-19 DIAGNOSIS — E119 Type 2 diabetes mellitus without complications: Secondary | ICD-10-CM | POA: Diagnosis not present

## 2023-07-19 DIAGNOSIS — N1831 Chronic kidney disease, stage 3a: Secondary | ICD-10-CM | POA: Diagnosis not present

## 2023-07-19 DIAGNOSIS — I639 Cerebral infarction, unspecified: Secondary | ICD-10-CM | POA: Diagnosis not present

## 2023-07-19 DIAGNOSIS — M6259 Muscle wasting and atrophy, not elsewhere classified, multiple sites: Secondary | ICD-10-CM | POA: Diagnosis not present

## 2023-07-20 DIAGNOSIS — R278 Other lack of coordination: Secondary | ICD-10-CM | POA: Diagnosis not present

## 2023-07-20 DIAGNOSIS — E119 Type 2 diabetes mellitus without complications: Secondary | ICD-10-CM | POA: Diagnosis not present

## 2023-07-20 DIAGNOSIS — I639 Cerebral infarction, unspecified: Secondary | ICD-10-CM | POA: Diagnosis not present

## 2023-07-20 DIAGNOSIS — E785 Hyperlipidemia, unspecified: Secondary | ICD-10-CM | POA: Diagnosis not present

## 2023-07-20 DIAGNOSIS — Z9181 History of falling: Secondary | ICD-10-CM | POA: Diagnosis not present

## 2023-07-20 DIAGNOSIS — N1831 Chronic kidney disease, stage 3a: Secondary | ICD-10-CM | POA: Diagnosis not present

## 2023-07-20 DIAGNOSIS — M6259 Muscle wasting and atrophy, not elsewhere classified, multiple sites: Secondary | ICD-10-CM | POA: Diagnosis not present

## 2023-07-21 DIAGNOSIS — M6259 Muscle wasting and atrophy, not elsewhere classified, multiple sites: Secondary | ICD-10-CM | POA: Diagnosis not present

## 2023-07-21 DIAGNOSIS — I639 Cerebral infarction, unspecified: Secondary | ICD-10-CM | POA: Diagnosis not present

## 2023-07-21 DIAGNOSIS — R278 Other lack of coordination: Secondary | ICD-10-CM | POA: Diagnosis not present

## 2023-07-21 DIAGNOSIS — E119 Type 2 diabetes mellitus without complications: Secondary | ICD-10-CM | POA: Diagnosis not present

## 2023-07-21 DIAGNOSIS — N1831 Chronic kidney disease, stage 3a: Secondary | ICD-10-CM | POA: Diagnosis not present

## 2023-07-21 DIAGNOSIS — E785 Hyperlipidemia, unspecified: Secondary | ICD-10-CM | POA: Diagnosis not present

## 2023-07-21 DIAGNOSIS — Z9181 History of falling: Secondary | ICD-10-CM | POA: Diagnosis not present

## 2023-07-22 DIAGNOSIS — E119 Type 2 diabetes mellitus without complications: Secondary | ICD-10-CM | POA: Diagnosis not present

## 2023-07-22 DIAGNOSIS — R278 Other lack of coordination: Secondary | ICD-10-CM | POA: Diagnosis not present

## 2023-07-22 DIAGNOSIS — N1831 Chronic kidney disease, stage 3a: Secondary | ICD-10-CM | POA: Diagnosis not present

## 2023-07-22 DIAGNOSIS — I639 Cerebral infarction, unspecified: Secondary | ICD-10-CM | POA: Diagnosis not present

## 2023-07-22 DIAGNOSIS — M6259 Muscle wasting and atrophy, not elsewhere classified, multiple sites: Secondary | ICD-10-CM | POA: Diagnosis not present

## 2023-07-22 DIAGNOSIS — E785 Hyperlipidemia, unspecified: Secondary | ICD-10-CM | POA: Diagnosis not present

## 2023-07-22 DIAGNOSIS — Z9181 History of falling: Secondary | ICD-10-CM | POA: Diagnosis not present

## 2023-07-23 DIAGNOSIS — M6259 Muscle wasting and atrophy, not elsewhere classified, multiple sites: Secondary | ICD-10-CM | POA: Diagnosis not present

## 2023-07-23 DIAGNOSIS — R278 Other lack of coordination: Secondary | ICD-10-CM | POA: Diagnosis not present

## 2023-07-23 DIAGNOSIS — I639 Cerebral infarction, unspecified: Secondary | ICD-10-CM | POA: Diagnosis not present

## 2023-07-23 DIAGNOSIS — E119 Type 2 diabetes mellitus without complications: Secondary | ICD-10-CM | POA: Diagnosis not present

## 2023-07-23 DIAGNOSIS — N1831 Chronic kidney disease, stage 3a: Secondary | ICD-10-CM | POA: Diagnosis not present

## 2023-07-23 DIAGNOSIS — E785 Hyperlipidemia, unspecified: Secondary | ICD-10-CM | POA: Diagnosis not present

## 2023-07-23 DIAGNOSIS — Z9181 History of falling: Secondary | ICD-10-CM | POA: Diagnosis not present

## 2023-07-26 DIAGNOSIS — M6259 Muscle wasting and atrophy, not elsewhere classified, multiple sites: Secondary | ICD-10-CM | POA: Diagnosis not present

## 2023-07-26 DIAGNOSIS — E1122 Type 2 diabetes mellitus with diabetic chronic kidney disease: Secondary | ICD-10-CM | POA: Diagnosis not present

## 2023-07-26 DIAGNOSIS — I6340 Cerebral infarction due to embolism of unspecified cerebral artery: Secondary | ICD-10-CM | POA: Diagnosis not present

## 2023-07-26 DIAGNOSIS — M6281 Muscle weakness (generalized): Secondary | ICD-10-CM | POA: Diagnosis not present

## 2023-07-26 DIAGNOSIS — N1831 Chronic kidney disease, stage 3a: Secondary | ICD-10-CM | POA: Diagnosis not present

## 2023-07-26 DIAGNOSIS — Z9181 History of falling: Secondary | ICD-10-CM | POA: Diagnosis not present

## 2023-07-26 DIAGNOSIS — E785 Hyperlipidemia, unspecified: Secondary | ICD-10-CM | POA: Diagnosis not present

## 2023-07-26 DIAGNOSIS — R278 Other lack of coordination: Secondary | ICD-10-CM | POA: Diagnosis not present

## 2023-07-26 DIAGNOSIS — E119 Type 2 diabetes mellitus without complications: Secondary | ICD-10-CM | POA: Diagnosis not present

## 2023-07-26 DIAGNOSIS — I639 Cerebral infarction, unspecified: Secondary | ICD-10-CM | POA: Diagnosis not present

## 2023-07-27 DIAGNOSIS — M6259 Muscle wasting and atrophy, not elsewhere classified, multiple sites: Secondary | ICD-10-CM | POA: Diagnosis not present

## 2023-07-27 DIAGNOSIS — Z9181 History of falling: Secondary | ICD-10-CM | POA: Diagnosis not present

## 2023-07-27 DIAGNOSIS — E119 Type 2 diabetes mellitus without complications: Secondary | ICD-10-CM | POA: Diagnosis not present

## 2023-07-27 DIAGNOSIS — R278 Other lack of coordination: Secondary | ICD-10-CM | POA: Diagnosis not present

## 2023-07-27 DIAGNOSIS — I639 Cerebral infarction, unspecified: Secondary | ICD-10-CM | POA: Diagnosis not present

## 2023-07-27 DIAGNOSIS — E785 Hyperlipidemia, unspecified: Secondary | ICD-10-CM | POA: Diagnosis not present

## 2023-07-27 DIAGNOSIS — N1831 Chronic kidney disease, stage 3a: Secondary | ICD-10-CM | POA: Diagnosis not present

## 2023-07-28 DIAGNOSIS — I639 Cerebral infarction, unspecified: Secondary | ICD-10-CM | POA: Diagnosis not present

## 2023-07-28 DIAGNOSIS — M6259 Muscle wasting and atrophy, not elsewhere classified, multiple sites: Secondary | ICD-10-CM | POA: Diagnosis not present

## 2023-07-28 DIAGNOSIS — N1831 Chronic kidney disease, stage 3a: Secondary | ICD-10-CM | POA: Diagnosis not present

## 2023-07-28 DIAGNOSIS — R278 Other lack of coordination: Secondary | ICD-10-CM | POA: Diagnosis not present

## 2023-07-28 DIAGNOSIS — E785 Hyperlipidemia, unspecified: Secondary | ICD-10-CM | POA: Diagnosis not present

## 2023-07-28 DIAGNOSIS — Z9181 History of falling: Secondary | ICD-10-CM | POA: Diagnosis not present

## 2023-07-28 DIAGNOSIS — E119 Type 2 diabetes mellitus without complications: Secondary | ICD-10-CM | POA: Diagnosis not present

## 2023-07-29 DIAGNOSIS — I6340 Cerebral infarction due to embolism of unspecified cerebral artery: Secondary | ICD-10-CM | POA: Diagnosis not present

## 2023-07-29 DIAGNOSIS — R278 Other lack of coordination: Secondary | ICD-10-CM | POA: Diagnosis not present

## 2023-07-29 DIAGNOSIS — M6259 Muscle wasting and atrophy, not elsewhere classified, multiple sites: Secondary | ICD-10-CM | POA: Diagnosis not present

## 2023-07-29 DIAGNOSIS — Z9181 History of falling: Secondary | ICD-10-CM | POA: Diagnosis not present

## 2023-07-29 DIAGNOSIS — E119 Type 2 diabetes mellitus without complications: Secondary | ICD-10-CM | POA: Diagnosis not present

## 2023-07-29 DIAGNOSIS — I639 Cerebral infarction, unspecified: Secondary | ICD-10-CM | POA: Diagnosis not present

## 2023-07-29 DIAGNOSIS — E1122 Type 2 diabetes mellitus with diabetic chronic kidney disease: Secondary | ICD-10-CM | POA: Diagnosis not present

## 2023-07-29 DIAGNOSIS — E785 Hyperlipidemia, unspecified: Secondary | ICD-10-CM | POA: Diagnosis not present

## 2023-07-29 DIAGNOSIS — N1831 Chronic kidney disease, stage 3a: Secondary | ICD-10-CM | POA: Diagnosis not present

## 2023-07-30 DIAGNOSIS — Z9181 History of falling: Secondary | ICD-10-CM | POA: Diagnosis not present

## 2023-07-30 DIAGNOSIS — R278 Other lack of coordination: Secondary | ICD-10-CM | POA: Diagnosis not present

## 2023-07-30 DIAGNOSIS — N1831 Chronic kidney disease, stage 3a: Secondary | ICD-10-CM | POA: Diagnosis not present

## 2023-07-30 DIAGNOSIS — M6259 Muscle wasting and atrophy, not elsewhere classified, multiple sites: Secondary | ICD-10-CM | POA: Diagnosis not present

## 2023-07-30 DIAGNOSIS — E119 Type 2 diabetes mellitus without complications: Secondary | ICD-10-CM | POA: Diagnosis not present

## 2023-07-30 DIAGNOSIS — E785 Hyperlipidemia, unspecified: Secondary | ICD-10-CM | POA: Diagnosis not present

## 2023-07-30 DIAGNOSIS — I639 Cerebral infarction, unspecified: Secondary | ICD-10-CM | POA: Diagnosis not present

## 2023-08-02 DIAGNOSIS — I639 Cerebral infarction, unspecified: Secondary | ICD-10-CM | POA: Diagnosis not present

## 2023-08-02 DIAGNOSIS — N1831 Chronic kidney disease, stage 3a: Secondary | ICD-10-CM | POA: Diagnosis not present

## 2023-08-02 DIAGNOSIS — E785 Hyperlipidemia, unspecified: Secondary | ICD-10-CM | POA: Diagnosis not present

## 2023-08-02 DIAGNOSIS — E119 Type 2 diabetes mellitus without complications: Secondary | ICD-10-CM | POA: Diagnosis not present

## 2023-08-02 DIAGNOSIS — M6259 Muscle wasting and atrophy, not elsewhere classified, multiple sites: Secondary | ICD-10-CM | POA: Diagnosis not present

## 2023-08-02 DIAGNOSIS — R278 Other lack of coordination: Secondary | ICD-10-CM | POA: Diagnosis not present

## 2023-08-03 DIAGNOSIS — I639 Cerebral infarction, unspecified: Secondary | ICD-10-CM | POA: Diagnosis not present

## 2023-08-03 DIAGNOSIS — E119 Type 2 diabetes mellitus without complications: Secondary | ICD-10-CM | POA: Diagnosis not present

## 2023-08-03 DIAGNOSIS — N1831 Chronic kidney disease, stage 3a: Secondary | ICD-10-CM | POA: Diagnosis not present

## 2023-08-03 DIAGNOSIS — R278 Other lack of coordination: Secondary | ICD-10-CM | POA: Diagnosis not present

## 2023-08-03 DIAGNOSIS — E785 Hyperlipidemia, unspecified: Secondary | ICD-10-CM | POA: Diagnosis not present

## 2023-08-03 DIAGNOSIS — M6259 Muscle wasting and atrophy, not elsewhere classified, multiple sites: Secondary | ICD-10-CM | POA: Diagnosis not present

## 2023-08-04 DIAGNOSIS — M6259 Muscle wasting and atrophy, not elsewhere classified, multiple sites: Secondary | ICD-10-CM | POA: Diagnosis not present

## 2023-08-04 DIAGNOSIS — E119 Type 2 diabetes mellitus without complications: Secondary | ICD-10-CM | POA: Diagnosis not present

## 2023-08-04 DIAGNOSIS — I639 Cerebral infarction, unspecified: Secondary | ICD-10-CM | POA: Diagnosis not present

## 2023-08-04 DIAGNOSIS — N1831 Chronic kidney disease, stage 3a: Secondary | ICD-10-CM | POA: Diagnosis not present

## 2023-08-04 DIAGNOSIS — R278 Other lack of coordination: Secondary | ICD-10-CM | POA: Diagnosis not present

## 2023-08-04 DIAGNOSIS — E785 Hyperlipidemia, unspecified: Secondary | ICD-10-CM | POA: Diagnosis not present

## 2023-08-05 DIAGNOSIS — E785 Hyperlipidemia, unspecified: Secondary | ICD-10-CM | POA: Diagnosis not present

## 2023-08-05 DIAGNOSIS — N1831 Chronic kidney disease, stage 3a: Secondary | ICD-10-CM | POA: Diagnosis not present

## 2023-08-05 DIAGNOSIS — R278 Other lack of coordination: Secondary | ICD-10-CM | POA: Diagnosis not present

## 2023-08-05 DIAGNOSIS — M6259 Muscle wasting and atrophy, not elsewhere classified, multiple sites: Secondary | ICD-10-CM | POA: Diagnosis not present

## 2023-08-05 DIAGNOSIS — I639 Cerebral infarction, unspecified: Secondary | ICD-10-CM | POA: Diagnosis not present

## 2023-08-05 DIAGNOSIS — E119 Type 2 diabetes mellitus without complications: Secondary | ICD-10-CM | POA: Diagnosis not present

## 2023-08-06 DIAGNOSIS — E119 Type 2 diabetes mellitus without complications: Secondary | ICD-10-CM | POA: Diagnosis not present

## 2023-08-06 DIAGNOSIS — N1831 Chronic kidney disease, stage 3a: Secondary | ICD-10-CM | POA: Diagnosis not present

## 2023-08-06 DIAGNOSIS — I639 Cerebral infarction, unspecified: Secondary | ICD-10-CM | POA: Diagnosis not present

## 2023-08-06 DIAGNOSIS — R278 Other lack of coordination: Secondary | ICD-10-CM | POA: Diagnosis not present

## 2023-08-06 DIAGNOSIS — M6259 Muscle wasting and atrophy, not elsewhere classified, multiple sites: Secondary | ICD-10-CM | POA: Diagnosis not present

## 2023-08-06 DIAGNOSIS — E785 Hyperlipidemia, unspecified: Secondary | ICD-10-CM | POA: Diagnosis not present

## 2023-08-11 DIAGNOSIS — N1831 Chronic kidney disease, stage 3a: Secondary | ICD-10-CM | POA: Diagnosis not present

## 2023-08-11 DIAGNOSIS — R278 Other lack of coordination: Secondary | ICD-10-CM | POA: Diagnosis not present

## 2023-08-11 DIAGNOSIS — I639 Cerebral infarction, unspecified: Secondary | ICD-10-CM | POA: Diagnosis not present

## 2023-08-11 DIAGNOSIS — M6259 Muscle wasting and atrophy, not elsewhere classified, multiple sites: Secondary | ICD-10-CM | POA: Diagnosis not present

## 2023-08-11 DIAGNOSIS — E119 Type 2 diabetes mellitus without complications: Secondary | ICD-10-CM | POA: Diagnosis not present

## 2023-08-11 DIAGNOSIS — E785 Hyperlipidemia, unspecified: Secondary | ICD-10-CM | POA: Diagnosis not present

## 2023-08-12 DIAGNOSIS — M6281 Muscle weakness (generalized): Secondary | ICD-10-CM | POA: Diagnosis not present

## 2023-08-12 DIAGNOSIS — N1831 Chronic kidney disease, stage 3a: Secondary | ICD-10-CM | POA: Diagnosis not present

## 2023-08-12 DIAGNOSIS — I6340 Cerebral infarction due to embolism of unspecified cerebral artery: Secondary | ICD-10-CM | POA: Diagnosis not present

## 2023-08-12 DIAGNOSIS — E1122 Type 2 diabetes mellitus with diabetic chronic kidney disease: Secondary | ICD-10-CM | POA: Diagnosis not present

## 2023-08-13 DIAGNOSIS — E119 Type 2 diabetes mellitus without complications: Secondary | ICD-10-CM | POA: Diagnosis not present

## 2023-08-13 DIAGNOSIS — I639 Cerebral infarction, unspecified: Secondary | ICD-10-CM | POA: Diagnosis not present

## 2023-08-13 DIAGNOSIS — R278 Other lack of coordination: Secondary | ICD-10-CM | POA: Diagnosis not present

## 2023-08-13 DIAGNOSIS — E785 Hyperlipidemia, unspecified: Secondary | ICD-10-CM | POA: Diagnosis not present

## 2023-08-13 DIAGNOSIS — M6259 Muscle wasting and atrophy, not elsewhere classified, multiple sites: Secondary | ICD-10-CM | POA: Diagnosis not present

## 2023-08-13 DIAGNOSIS — N1831 Chronic kidney disease, stage 3a: Secondary | ICD-10-CM | POA: Diagnosis not present

## 2023-08-16 DIAGNOSIS — E785 Hyperlipidemia, unspecified: Secondary | ICD-10-CM | POA: Diagnosis not present

## 2023-08-16 DIAGNOSIS — R278 Other lack of coordination: Secondary | ICD-10-CM | POA: Diagnosis not present

## 2023-08-16 DIAGNOSIS — I639 Cerebral infarction, unspecified: Secondary | ICD-10-CM | POA: Diagnosis not present

## 2023-08-16 DIAGNOSIS — M6259 Muscle wasting and atrophy, not elsewhere classified, multiple sites: Secondary | ICD-10-CM | POA: Diagnosis not present

## 2023-08-16 DIAGNOSIS — E119 Type 2 diabetes mellitus without complications: Secondary | ICD-10-CM | POA: Diagnosis not present

## 2023-08-16 DIAGNOSIS — N1831 Chronic kidney disease, stage 3a: Secondary | ICD-10-CM | POA: Diagnosis not present

## 2023-08-18 DIAGNOSIS — N1831 Chronic kidney disease, stage 3a: Secondary | ICD-10-CM | POA: Diagnosis not present

## 2023-08-18 DIAGNOSIS — E785 Hyperlipidemia, unspecified: Secondary | ICD-10-CM | POA: Diagnosis not present

## 2023-08-18 DIAGNOSIS — I639 Cerebral infarction, unspecified: Secondary | ICD-10-CM | POA: Diagnosis not present

## 2023-08-18 DIAGNOSIS — R278 Other lack of coordination: Secondary | ICD-10-CM | POA: Diagnosis not present

## 2023-08-18 DIAGNOSIS — M6259 Muscle wasting and atrophy, not elsewhere classified, multiple sites: Secondary | ICD-10-CM | POA: Diagnosis not present

## 2023-08-18 DIAGNOSIS — E119 Type 2 diabetes mellitus without complications: Secondary | ICD-10-CM | POA: Diagnosis not present

## 2023-08-20 DIAGNOSIS — N1831 Chronic kidney disease, stage 3a: Secondary | ICD-10-CM | POA: Diagnosis not present

## 2023-08-20 DIAGNOSIS — E785 Hyperlipidemia, unspecified: Secondary | ICD-10-CM | POA: Diagnosis not present

## 2023-08-20 DIAGNOSIS — R278 Other lack of coordination: Secondary | ICD-10-CM | POA: Diagnosis not present

## 2023-08-20 DIAGNOSIS — M6259 Muscle wasting and atrophy, not elsewhere classified, multiple sites: Secondary | ICD-10-CM | POA: Diagnosis not present

## 2023-08-20 DIAGNOSIS — E119 Type 2 diabetes mellitus without complications: Secondary | ICD-10-CM | POA: Diagnosis not present

## 2023-08-20 DIAGNOSIS — I639 Cerebral infarction, unspecified: Secondary | ICD-10-CM | POA: Diagnosis not present

## 2023-08-23 DIAGNOSIS — M6259 Muscle wasting and atrophy, not elsewhere classified, multiple sites: Secondary | ICD-10-CM | POA: Diagnosis not present

## 2023-08-23 DIAGNOSIS — N1831 Chronic kidney disease, stage 3a: Secondary | ICD-10-CM | POA: Diagnosis not present

## 2023-08-23 DIAGNOSIS — E119 Type 2 diabetes mellitus without complications: Secondary | ICD-10-CM | POA: Diagnosis not present

## 2023-08-23 DIAGNOSIS — E785 Hyperlipidemia, unspecified: Secondary | ICD-10-CM | POA: Diagnosis not present

## 2023-08-23 DIAGNOSIS — R278 Other lack of coordination: Secondary | ICD-10-CM | POA: Diagnosis not present

## 2023-08-23 DIAGNOSIS — I639 Cerebral infarction, unspecified: Secondary | ICD-10-CM | POA: Diagnosis not present

## 2023-08-25 DIAGNOSIS — I6340 Cerebral infarction due to embolism of unspecified cerebral artery: Secondary | ICD-10-CM | POA: Diagnosis not present

## 2023-08-25 DIAGNOSIS — M6259 Muscle wasting and atrophy, not elsewhere classified, multiple sites: Secondary | ICD-10-CM | POA: Diagnosis not present

## 2023-08-25 DIAGNOSIS — E1122 Type 2 diabetes mellitus with diabetic chronic kidney disease: Secondary | ICD-10-CM | POA: Diagnosis not present

## 2023-08-25 DIAGNOSIS — R278 Other lack of coordination: Secondary | ICD-10-CM | POA: Diagnosis not present

## 2023-08-25 DIAGNOSIS — I639 Cerebral infarction, unspecified: Secondary | ICD-10-CM | POA: Diagnosis not present

## 2023-08-25 DIAGNOSIS — E119 Type 2 diabetes mellitus without complications: Secondary | ICD-10-CM | POA: Diagnosis not present

## 2023-08-25 DIAGNOSIS — E785 Hyperlipidemia, unspecified: Secondary | ICD-10-CM | POA: Diagnosis not present

## 2023-08-25 DIAGNOSIS — N1831 Chronic kidney disease, stage 3a: Secondary | ICD-10-CM | POA: Diagnosis not present

## 2023-08-26 DIAGNOSIS — I6340 Cerebral infarction due to embolism of unspecified cerebral artery: Secondary | ICD-10-CM | POA: Diagnosis not present

## 2023-08-26 DIAGNOSIS — E1122 Type 2 diabetes mellitus with diabetic chronic kidney disease: Secondary | ICD-10-CM | POA: Diagnosis not present

## 2023-08-26 DIAGNOSIS — N1831 Chronic kidney disease, stage 3a: Secondary | ICD-10-CM | POA: Diagnosis not present

## 2023-08-26 DIAGNOSIS — M6281 Muscle weakness (generalized): Secondary | ICD-10-CM | POA: Diagnosis not present

## 2023-08-27 DIAGNOSIS — E785 Hyperlipidemia, unspecified: Secondary | ICD-10-CM | POA: Diagnosis not present

## 2023-08-27 DIAGNOSIS — I639 Cerebral infarction, unspecified: Secondary | ICD-10-CM | POA: Diagnosis not present

## 2023-08-27 DIAGNOSIS — M6259 Muscle wasting and atrophy, not elsewhere classified, multiple sites: Secondary | ICD-10-CM | POA: Diagnosis not present

## 2023-08-27 DIAGNOSIS — N1831 Chronic kidney disease, stage 3a: Secondary | ICD-10-CM | POA: Diagnosis not present

## 2023-08-27 DIAGNOSIS — E119 Type 2 diabetes mellitus without complications: Secondary | ICD-10-CM | POA: Diagnosis not present

## 2023-08-27 DIAGNOSIS — R278 Other lack of coordination: Secondary | ICD-10-CM | POA: Diagnosis not present

## 2023-08-30 DIAGNOSIS — E119 Type 2 diabetes mellitus without complications: Secondary | ICD-10-CM | POA: Diagnosis not present

## 2023-08-30 DIAGNOSIS — I639 Cerebral infarction, unspecified: Secondary | ICD-10-CM | POA: Diagnosis not present

## 2023-08-30 DIAGNOSIS — R278 Other lack of coordination: Secondary | ICD-10-CM | POA: Diagnosis not present

## 2023-08-30 DIAGNOSIS — M6259 Muscle wasting and atrophy, not elsewhere classified, multiple sites: Secondary | ICD-10-CM | POA: Diagnosis not present

## 2023-08-30 DIAGNOSIS — E785 Hyperlipidemia, unspecified: Secondary | ICD-10-CM | POA: Diagnosis not present

## 2023-08-30 DIAGNOSIS — N1831 Chronic kidney disease, stage 3a: Secondary | ICD-10-CM | POA: Diagnosis not present

## 2023-08-31 DIAGNOSIS — Z7185 Encounter for immunization safety counseling: Secondary | ICD-10-CM | POA: Diagnosis not present

## 2023-08-31 DIAGNOSIS — Z23 Encounter for immunization: Secondary | ICD-10-CM | POA: Diagnosis not present

## 2023-09-01 DIAGNOSIS — N1831 Chronic kidney disease, stage 3a: Secondary | ICD-10-CM | POA: Diagnosis not present

## 2023-09-01 DIAGNOSIS — I639 Cerebral infarction, unspecified: Secondary | ICD-10-CM | POA: Diagnosis not present

## 2023-09-01 DIAGNOSIS — E119 Type 2 diabetes mellitus without complications: Secondary | ICD-10-CM | POA: Diagnosis not present

## 2023-09-01 DIAGNOSIS — R278 Other lack of coordination: Secondary | ICD-10-CM | POA: Diagnosis not present

## 2023-09-01 DIAGNOSIS — M6259 Muscle wasting and atrophy, not elsewhere classified, multiple sites: Secondary | ICD-10-CM | POA: Diagnosis not present

## 2023-09-01 DIAGNOSIS — E785 Hyperlipidemia, unspecified: Secondary | ICD-10-CM | POA: Diagnosis not present

## 2023-09-03 DIAGNOSIS — R278 Other lack of coordination: Secondary | ICD-10-CM | POA: Diagnosis not present

## 2023-09-03 DIAGNOSIS — I639 Cerebral infarction, unspecified: Secondary | ICD-10-CM | POA: Diagnosis not present

## 2023-09-03 DIAGNOSIS — M6259 Muscle wasting and atrophy, not elsewhere classified, multiple sites: Secondary | ICD-10-CM | POA: Diagnosis not present

## 2023-09-03 DIAGNOSIS — E785 Hyperlipidemia, unspecified: Secondary | ICD-10-CM | POA: Diagnosis not present

## 2023-09-03 DIAGNOSIS — N1831 Chronic kidney disease, stage 3a: Secondary | ICD-10-CM | POA: Diagnosis not present

## 2023-09-03 DIAGNOSIS — E119 Type 2 diabetes mellitus without complications: Secondary | ICD-10-CM | POA: Diagnosis not present

## 2023-09-06 DIAGNOSIS — E119 Type 2 diabetes mellitus without complications: Secondary | ICD-10-CM | POA: Diagnosis not present

## 2023-09-06 DIAGNOSIS — M6259 Muscle wasting and atrophy, not elsewhere classified, multiple sites: Secondary | ICD-10-CM | POA: Diagnosis not present

## 2023-09-06 DIAGNOSIS — I639 Cerebral infarction, unspecified: Secondary | ICD-10-CM | POA: Diagnosis not present

## 2023-09-06 DIAGNOSIS — E785 Hyperlipidemia, unspecified: Secondary | ICD-10-CM | POA: Diagnosis not present

## 2023-09-06 DIAGNOSIS — N1831 Chronic kidney disease, stage 3a: Secondary | ICD-10-CM | POA: Diagnosis not present

## 2023-09-06 DIAGNOSIS — R278 Other lack of coordination: Secondary | ICD-10-CM | POA: Diagnosis not present

## 2023-09-08 DIAGNOSIS — E785 Hyperlipidemia, unspecified: Secondary | ICD-10-CM | POA: Diagnosis not present

## 2023-09-08 DIAGNOSIS — I10 Essential (primary) hypertension: Secondary | ICD-10-CM | POA: Diagnosis not present

## 2023-09-08 DIAGNOSIS — N1831 Chronic kidney disease, stage 3a: Secondary | ICD-10-CM | POA: Diagnosis not present

## 2023-09-08 DIAGNOSIS — I639 Cerebral infarction, unspecified: Secondary | ICD-10-CM | POA: Diagnosis not present

## 2023-09-08 DIAGNOSIS — M6259 Muscle wasting and atrophy, not elsewhere classified, multiple sites: Secondary | ICD-10-CM | POA: Diagnosis not present

## 2023-09-08 DIAGNOSIS — E119 Type 2 diabetes mellitus without complications: Secondary | ICD-10-CM | POA: Diagnosis not present

## 2023-09-08 DIAGNOSIS — F01A0 Vascular dementia, mild, without behavioral disturbance, psychotic disturbance, mood disturbance, and anxiety: Secondary | ICD-10-CM | POA: Diagnosis not present

## 2023-09-08 DIAGNOSIS — R278 Other lack of coordination: Secondary | ICD-10-CM | POA: Diagnosis not present

## 2023-09-10 DIAGNOSIS — N1831 Chronic kidney disease, stage 3a: Secondary | ICD-10-CM | POA: Diagnosis not present

## 2023-09-10 DIAGNOSIS — I639 Cerebral infarction, unspecified: Secondary | ICD-10-CM | POA: Diagnosis not present

## 2023-09-10 DIAGNOSIS — E785 Hyperlipidemia, unspecified: Secondary | ICD-10-CM | POA: Diagnosis not present

## 2023-09-10 DIAGNOSIS — E119 Type 2 diabetes mellitus without complications: Secondary | ICD-10-CM | POA: Diagnosis not present

## 2023-09-10 DIAGNOSIS — M6259 Muscle wasting and atrophy, not elsewhere classified, multiple sites: Secondary | ICD-10-CM | POA: Diagnosis not present

## 2023-09-10 DIAGNOSIS — R278 Other lack of coordination: Secondary | ICD-10-CM | POA: Diagnosis not present

## 2023-09-13 DIAGNOSIS — E785 Hyperlipidemia, unspecified: Secondary | ICD-10-CM | POA: Diagnosis not present

## 2023-09-13 DIAGNOSIS — I639 Cerebral infarction, unspecified: Secondary | ICD-10-CM | POA: Diagnosis not present

## 2023-09-13 DIAGNOSIS — N1831 Chronic kidney disease, stage 3a: Secondary | ICD-10-CM | POA: Diagnosis not present

## 2023-09-13 DIAGNOSIS — R278 Other lack of coordination: Secondary | ICD-10-CM | POA: Diagnosis not present

## 2023-09-13 DIAGNOSIS — M6259 Muscle wasting and atrophy, not elsewhere classified, multiple sites: Secondary | ICD-10-CM | POA: Diagnosis not present

## 2023-09-13 DIAGNOSIS — E119 Type 2 diabetes mellitus without complications: Secondary | ICD-10-CM | POA: Diagnosis not present

## 2023-09-15 DIAGNOSIS — M6259 Muscle wasting and atrophy, not elsewhere classified, multiple sites: Secondary | ICD-10-CM | POA: Diagnosis not present

## 2023-09-15 DIAGNOSIS — E785 Hyperlipidemia, unspecified: Secondary | ICD-10-CM | POA: Diagnosis not present

## 2023-09-15 DIAGNOSIS — N1831 Chronic kidney disease, stage 3a: Secondary | ICD-10-CM | POA: Diagnosis not present

## 2023-09-15 DIAGNOSIS — E119 Type 2 diabetes mellitus without complications: Secondary | ICD-10-CM | POA: Diagnosis not present

## 2023-09-15 DIAGNOSIS — R278 Other lack of coordination: Secondary | ICD-10-CM | POA: Diagnosis not present

## 2023-09-15 DIAGNOSIS — I639 Cerebral infarction, unspecified: Secondary | ICD-10-CM | POA: Diagnosis not present

## 2023-09-17 ENCOUNTER — Encounter: Payer: Self-pay | Admitting: Pharmacist

## 2023-09-17 DIAGNOSIS — M6259 Muscle wasting and atrophy, not elsewhere classified, multiple sites: Secondary | ICD-10-CM | POA: Diagnosis not present

## 2023-09-17 DIAGNOSIS — N1831 Chronic kidney disease, stage 3a: Secondary | ICD-10-CM | POA: Diagnosis not present

## 2023-09-17 DIAGNOSIS — E119 Type 2 diabetes mellitus without complications: Secondary | ICD-10-CM | POA: Diagnosis not present

## 2023-09-17 DIAGNOSIS — E785 Hyperlipidemia, unspecified: Secondary | ICD-10-CM | POA: Diagnosis not present

## 2023-09-17 DIAGNOSIS — I639 Cerebral infarction, unspecified: Secondary | ICD-10-CM | POA: Diagnosis not present

## 2023-09-17 DIAGNOSIS — R278 Other lack of coordination: Secondary | ICD-10-CM | POA: Diagnosis not present

## 2023-09-17 NOTE — Progress Notes (Signed)
Pharmacy Quality Measure Review  This patient is appearing on a report for being at risk of failing the adherence measure for hypertension (ACEi/ARB) medications this calendar year.   Medication: Losartan 100 mg Last fill date: 09/02/2023 for 30 day supply  Insurance report was not up to date. No action needed at this time.   Arbutus Leas, PharmD, BCPS [facility] [phone]

## 2023-09-20 DIAGNOSIS — I639 Cerebral infarction, unspecified: Secondary | ICD-10-CM | POA: Diagnosis not present

## 2023-09-20 DIAGNOSIS — E785 Hyperlipidemia, unspecified: Secondary | ICD-10-CM | POA: Diagnosis not present

## 2023-09-20 DIAGNOSIS — N1831 Chronic kidney disease, stage 3a: Secondary | ICD-10-CM | POA: Diagnosis not present

## 2023-09-20 DIAGNOSIS — R278 Other lack of coordination: Secondary | ICD-10-CM | POA: Diagnosis not present

## 2023-09-20 DIAGNOSIS — E119 Type 2 diabetes mellitus without complications: Secondary | ICD-10-CM | POA: Diagnosis not present

## 2023-09-20 DIAGNOSIS — M6259 Muscle wasting and atrophy, not elsewhere classified, multiple sites: Secondary | ICD-10-CM | POA: Diagnosis not present

## 2023-09-21 DIAGNOSIS — E1122 Type 2 diabetes mellitus with diabetic chronic kidney disease: Secondary | ICD-10-CM | POA: Diagnosis not present

## 2023-09-21 DIAGNOSIS — I6340 Cerebral infarction due to embolism of unspecified cerebral artery: Secondary | ICD-10-CM | POA: Diagnosis not present

## 2023-09-22 DIAGNOSIS — I6930 Unspecified sequelae of cerebral infarction: Secondary | ICD-10-CM | POA: Diagnosis not present

## 2023-09-22 DIAGNOSIS — E119 Type 2 diabetes mellitus without complications: Secondary | ICD-10-CM | POA: Diagnosis not present

## 2023-09-22 DIAGNOSIS — E559 Vitamin D deficiency, unspecified: Secondary | ICD-10-CM | POA: Diagnosis not present

## 2023-09-22 DIAGNOSIS — E785 Hyperlipidemia, unspecified: Secondary | ICD-10-CM | POA: Diagnosis not present

## 2023-09-22 DIAGNOSIS — M6259 Muscle wasting and atrophy, not elsewhere classified, multiple sites: Secondary | ICD-10-CM | POA: Diagnosis not present

## 2023-09-22 DIAGNOSIS — N1831 Chronic kidney disease, stage 3a: Secondary | ICD-10-CM | POA: Diagnosis not present

## 2023-09-22 DIAGNOSIS — E1122 Type 2 diabetes mellitus with diabetic chronic kidney disease: Secondary | ICD-10-CM | POA: Diagnosis not present

## 2023-09-22 DIAGNOSIS — I359 Nonrheumatic aortic valve disorder, unspecified: Secondary | ICD-10-CM | POA: Diagnosis not present

## 2023-09-22 DIAGNOSIS — R278 Other lack of coordination: Secondary | ICD-10-CM | POA: Diagnosis not present

## 2023-09-22 DIAGNOSIS — I639 Cerebral infarction, unspecified: Secondary | ICD-10-CM | POA: Diagnosis not present

## 2023-09-24 DIAGNOSIS — N1831 Chronic kidney disease, stage 3a: Secondary | ICD-10-CM | POA: Diagnosis not present

## 2023-09-24 DIAGNOSIS — I639 Cerebral infarction, unspecified: Secondary | ICD-10-CM | POA: Diagnosis not present

## 2023-09-24 DIAGNOSIS — E785 Hyperlipidemia, unspecified: Secondary | ICD-10-CM | POA: Diagnosis not present

## 2023-09-24 DIAGNOSIS — M6259 Muscle wasting and atrophy, not elsewhere classified, multiple sites: Secondary | ICD-10-CM | POA: Diagnosis not present

## 2023-09-24 DIAGNOSIS — E119 Type 2 diabetes mellitus without complications: Secondary | ICD-10-CM | POA: Diagnosis not present

## 2023-09-24 DIAGNOSIS — R278 Other lack of coordination: Secondary | ICD-10-CM | POA: Diagnosis not present

## 2023-09-27 DIAGNOSIS — M6259 Muscle wasting and atrophy, not elsewhere classified, multiple sites: Secondary | ICD-10-CM | POA: Diagnosis not present

## 2023-09-27 DIAGNOSIS — E559 Vitamin D deficiency, unspecified: Secondary | ICD-10-CM | POA: Diagnosis not present

## 2023-09-27 DIAGNOSIS — I639 Cerebral infarction, unspecified: Secondary | ICD-10-CM | POA: Diagnosis not present

## 2023-09-27 DIAGNOSIS — R278 Other lack of coordination: Secondary | ICD-10-CM | POA: Diagnosis not present

## 2023-09-27 DIAGNOSIS — D649 Anemia, unspecified: Secondary | ICD-10-CM | POA: Diagnosis not present

## 2023-09-27 DIAGNOSIS — E785 Hyperlipidemia, unspecified: Secondary | ICD-10-CM | POA: Diagnosis not present

## 2023-09-27 DIAGNOSIS — N1831 Chronic kidney disease, stage 3a: Secondary | ICD-10-CM | POA: Diagnosis not present

## 2023-09-27 DIAGNOSIS — E119 Type 2 diabetes mellitus without complications: Secondary | ICD-10-CM | POA: Diagnosis not present

## 2023-09-29 DIAGNOSIS — E785 Hyperlipidemia, unspecified: Secondary | ICD-10-CM | POA: Diagnosis not present

## 2023-09-29 DIAGNOSIS — M6259 Muscle wasting and atrophy, not elsewhere classified, multiple sites: Secondary | ICD-10-CM | POA: Diagnosis not present

## 2023-09-29 DIAGNOSIS — E119 Type 2 diabetes mellitus without complications: Secondary | ICD-10-CM | POA: Diagnosis not present

## 2023-09-29 DIAGNOSIS — I10 Essential (primary) hypertension: Secondary | ICD-10-CM | POA: Diagnosis not present

## 2023-09-29 DIAGNOSIS — E559 Vitamin D deficiency, unspecified: Secondary | ICD-10-CM | POA: Diagnosis not present

## 2023-09-29 DIAGNOSIS — N1831 Chronic kidney disease, stage 3a: Secondary | ICD-10-CM | POA: Diagnosis not present

## 2023-09-29 DIAGNOSIS — R7989 Other specified abnormal findings of blood chemistry: Secondary | ICD-10-CM | POA: Diagnosis not present

## 2023-09-29 DIAGNOSIS — R278 Other lack of coordination: Secondary | ICD-10-CM | POA: Diagnosis not present

## 2023-09-29 DIAGNOSIS — I639 Cerebral infarction, unspecified: Secondary | ICD-10-CM | POA: Diagnosis not present

## 2023-10-01 DIAGNOSIS — N1831 Chronic kidney disease, stage 3a: Secondary | ICD-10-CM | POA: Diagnosis not present

## 2023-10-01 DIAGNOSIS — M6259 Muscle wasting and atrophy, not elsewhere classified, multiple sites: Secondary | ICD-10-CM | POA: Diagnosis not present

## 2023-10-01 DIAGNOSIS — R278 Other lack of coordination: Secondary | ICD-10-CM | POA: Diagnosis not present

## 2023-10-01 DIAGNOSIS — E785 Hyperlipidemia, unspecified: Secondary | ICD-10-CM | POA: Diagnosis not present

## 2023-10-01 DIAGNOSIS — E119 Type 2 diabetes mellitus without complications: Secondary | ICD-10-CM | POA: Diagnosis not present

## 2023-10-01 DIAGNOSIS — I639 Cerebral infarction, unspecified: Secondary | ICD-10-CM | POA: Diagnosis not present

## 2023-10-04 DIAGNOSIS — R278 Other lack of coordination: Secondary | ICD-10-CM | POA: Diagnosis not present

## 2023-10-04 DIAGNOSIS — M6259 Muscle wasting and atrophy, not elsewhere classified, multiple sites: Secondary | ICD-10-CM | POA: Diagnosis not present

## 2023-10-04 DIAGNOSIS — E785 Hyperlipidemia, unspecified: Secondary | ICD-10-CM | POA: Diagnosis not present

## 2023-10-04 DIAGNOSIS — N1831 Chronic kidney disease, stage 3a: Secondary | ICD-10-CM | POA: Diagnosis not present

## 2023-10-04 DIAGNOSIS — E119 Type 2 diabetes mellitus without complications: Secondary | ICD-10-CM | POA: Diagnosis not present

## 2023-10-04 DIAGNOSIS — I639 Cerebral infarction, unspecified: Secondary | ICD-10-CM | POA: Diagnosis not present

## 2023-10-05 DIAGNOSIS — I739 Peripheral vascular disease, unspecified: Secondary | ICD-10-CM | POA: Diagnosis not present

## 2023-10-05 DIAGNOSIS — N1831 Chronic kidney disease, stage 3a: Secondary | ICD-10-CM | POA: Diagnosis not present

## 2023-10-05 DIAGNOSIS — I6340 Cerebral infarction due to embolism of unspecified cerebral artery: Secondary | ICD-10-CM | POA: Diagnosis not present

## 2023-10-05 DIAGNOSIS — E1122 Type 2 diabetes mellitus with diabetic chronic kidney disease: Secondary | ICD-10-CM | POA: Diagnosis not present

## 2023-10-07 DIAGNOSIS — E785 Hyperlipidemia, unspecified: Secondary | ICD-10-CM | POA: Diagnosis not present

## 2023-10-07 DIAGNOSIS — I639 Cerebral infarction, unspecified: Secondary | ICD-10-CM | POA: Diagnosis not present

## 2023-10-07 DIAGNOSIS — M6259 Muscle wasting and atrophy, not elsewhere classified, multiple sites: Secondary | ICD-10-CM | POA: Diagnosis not present

## 2023-10-07 DIAGNOSIS — R278 Other lack of coordination: Secondary | ICD-10-CM | POA: Diagnosis not present

## 2023-10-07 DIAGNOSIS — N1831 Chronic kidney disease, stage 3a: Secondary | ICD-10-CM | POA: Diagnosis not present

## 2023-10-07 DIAGNOSIS — E119 Type 2 diabetes mellitus without complications: Secondary | ICD-10-CM | POA: Diagnosis not present

## 2023-10-08 DIAGNOSIS — M6259 Muscle wasting and atrophy, not elsewhere classified, multiple sites: Secondary | ICD-10-CM | POA: Diagnosis not present

## 2023-10-08 DIAGNOSIS — E785 Hyperlipidemia, unspecified: Secondary | ICD-10-CM | POA: Diagnosis not present

## 2023-10-08 DIAGNOSIS — N1831 Chronic kidney disease, stage 3a: Secondary | ICD-10-CM | POA: Diagnosis not present

## 2023-10-08 DIAGNOSIS — E119 Type 2 diabetes mellitus without complications: Secondary | ICD-10-CM | POA: Diagnosis not present

## 2023-10-08 DIAGNOSIS — I639 Cerebral infarction, unspecified: Secondary | ICD-10-CM | POA: Diagnosis not present

## 2023-10-08 DIAGNOSIS — R278 Other lack of coordination: Secondary | ICD-10-CM | POA: Diagnosis not present

## 2023-10-09 DIAGNOSIS — E785 Hyperlipidemia, unspecified: Secondary | ICD-10-CM | POA: Diagnosis not present

## 2023-10-09 DIAGNOSIS — R278 Other lack of coordination: Secondary | ICD-10-CM | POA: Diagnosis not present

## 2023-10-09 DIAGNOSIS — E119 Type 2 diabetes mellitus without complications: Secondary | ICD-10-CM | POA: Diagnosis not present

## 2023-10-09 DIAGNOSIS — M6259 Muscle wasting and atrophy, not elsewhere classified, multiple sites: Secondary | ICD-10-CM | POA: Diagnosis not present

## 2023-10-09 DIAGNOSIS — N1831 Chronic kidney disease, stage 3a: Secondary | ICD-10-CM | POA: Diagnosis not present

## 2023-10-09 DIAGNOSIS — I639 Cerebral infarction, unspecified: Secondary | ICD-10-CM | POA: Diagnosis not present

## 2023-10-10 DIAGNOSIS — E119 Type 2 diabetes mellitus without complications: Secondary | ICD-10-CM | POA: Diagnosis not present

## 2023-10-10 DIAGNOSIS — I639 Cerebral infarction, unspecified: Secondary | ICD-10-CM | POA: Diagnosis not present

## 2023-10-10 DIAGNOSIS — R278 Other lack of coordination: Secondary | ICD-10-CM | POA: Diagnosis not present

## 2023-10-10 DIAGNOSIS — N1831 Chronic kidney disease, stage 3a: Secondary | ICD-10-CM | POA: Diagnosis not present

## 2023-10-10 DIAGNOSIS — E785 Hyperlipidemia, unspecified: Secondary | ICD-10-CM | POA: Diagnosis not present

## 2023-10-10 DIAGNOSIS — M6259 Muscle wasting and atrophy, not elsewhere classified, multiple sites: Secondary | ICD-10-CM | POA: Diagnosis not present

## 2023-10-11 DIAGNOSIS — E119 Type 2 diabetes mellitus without complications: Secondary | ICD-10-CM | POA: Diagnosis not present

## 2023-10-11 DIAGNOSIS — M6259 Muscle wasting and atrophy, not elsewhere classified, multiple sites: Secondary | ICD-10-CM | POA: Diagnosis not present

## 2023-10-11 DIAGNOSIS — I639 Cerebral infarction, unspecified: Secondary | ICD-10-CM | POA: Diagnosis not present

## 2023-10-11 DIAGNOSIS — N1831 Chronic kidney disease, stage 3a: Secondary | ICD-10-CM | POA: Diagnosis not present

## 2023-10-11 DIAGNOSIS — R278 Other lack of coordination: Secondary | ICD-10-CM | POA: Diagnosis not present

## 2023-10-11 DIAGNOSIS — E785 Hyperlipidemia, unspecified: Secondary | ICD-10-CM | POA: Diagnosis not present

## 2023-10-21 DIAGNOSIS — E1122 Type 2 diabetes mellitus with diabetic chronic kidney disease: Secondary | ICD-10-CM | POA: Diagnosis not present

## 2023-10-21 DIAGNOSIS — I6340 Cerebral infarction due to embolism of unspecified cerebral artery: Secondary | ICD-10-CM | POA: Diagnosis not present

## 2023-10-21 DIAGNOSIS — I359 Nonrheumatic aortic valve disorder, unspecified: Secondary | ICD-10-CM | POA: Diagnosis not present

## 2023-10-21 DIAGNOSIS — E559 Vitamin D deficiency, unspecified: Secondary | ICD-10-CM | POA: Diagnosis not present

## 2023-10-26 DIAGNOSIS — I6340 Cerebral infarction due to embolism of unspecified cerebral artery: Secondary | ICD-10-CM | POA: Diagnosis not present

## 2023-10-26 DIAGNOSIS — E1122 Type 2 diabetes mellitus with diabetic chronic kidney disease: Secondary | ICD-10-CM | POA: Diagnosis not present

## 2023-11-08 DIAGNOSIS — R296 Repeated falls: Secondary | ICD-10-CM | POA: Diagnosis not present

## 2023-11-10 DIAGNOSIS — I10 Essential (primary) hypertension: Secondary | ICD-10-CM | POA: Diagnosis not present

## 2023-11-10 DIAGNOSIS — F01A0 Vascular dementia, mild, without behavioral disturbance, psychotic disturbance, mood disturbance, and anxiety: Secondary | ICD-10-CM | POA: Diagnosis not present

## 2023-11-15 DIAGNOSIS — R296 Repeated falls: Secondary | ICD-10-CM | POA: Diagnosis not present

## 2023-11-18 DIAGNOSIS — I6340 Cerebral infarction due to embolism of unspecified cerebral artery: Secondary | ICD-10-CM | POA: Diagnosis not present

## 2023-11-18 DIAGNOSIS — I359 Nonrheumatic aortic valve disorder, unspecified: Secondary | ICD-10-CM | POA: Diagnosis not present

## 2023-11-18 DIAGNOSIS — E1122 Type 2 diabetes mellitus with diabetic chronic kidney disease: Secondary | ICD-10-CM | POA: Diagnosis not present

## 2023-11-18 DIAGNOSIS — E559 Vitamin D deficiency, unspecified: Secondary | ICD-10-CM | POA: Diagnosis not present

## 2023-11-25 DIAGNOSIS — I6340 Cerebral infarction due to embolism of unspecified cerebral artery: Secondary | ICD-10-CM | POA: Diagnosis not present

## 2023-11-25 DIAGNOSIS — E1122 Type 2 diabetes mellitus with diabetic chronic kidney disease: Secondary | ICD-10-CM | POA: Diagnosis not present

## 2023-12-07 DIAGNOSIS — E1122 Type 2 diabetes mellitus with diabetic chronic kidney disease: Secondary | ICD-10-CM | POA: Diagnosis not present

## 2023-12-07 DIAGNOSIS — I6340 Cerebral infarction due to embolism of unspecified cerebral artery: Secondary | ICD-10-CM | POA: Diagnosis not present

## 2023-12-07 DIAGNOSIS — I10 Essential (primary) hypertension: Secondary | ICD-10-CM | POA: Diagnosis not present

## 2023-12-07 DIAGNOSIS — I359 Nonrheumatic aortic valve disorder, unspecified: Secondary | ICD-10-CM | POA: Diagnosis not present

## 2023-12-16 DIAGNOSIS — I10 Essential (primary) hypertension: Secondary | ICD-10-CM | POA: Diagnosis not present

## 2023-12-16 DIAGNOSIS — E1122 Type 2 diabetes mellitus with diabetic chronic kidney disease: Secondary | ICD-10-CM | POA: Diagnosis not present

## 2023-12-16 DIAGNOSIS — N183 Chronic kidney disease, stage 3 unspecified: Secondary | ICD-10-CM | POA: Diagnosis not present

## 2023-12-16 DIAGNOSIS — I6990 Unspecified sequelae of unspecified cerebrovascular disease: Secondary | ICD-10-CM | POA: Diagnosis not present

## 2023-12-22 DIAGNOSIS — I10 Essential (primary) hypertension: Secondary | ICD-10-CM | POA: Diagnosis not present

## 2023-12-27 DIAGNOSIS — I10 Essential (primary) hypertension: Secondary | ICD-10-CM | POA: Diagnosis not present

## 2023-12-27 DIAGNOSIS — F01A0 Vascular dementia, mild, without behavioral disturbance, psychotic disturbance, mood disturbance, and anxiety: Secondary | ICD-10-CM | POA: Diagnosis not present

## 2024-01-04 DIAGNOSIS — E119 Type 2 diabetes mellitus without complications: Secondary | ICD-10-CM | POA: Diagnosis not present

## 2024-01-18 DIAGNOSIS — I10 Essential (primary) hypertension: Secondary | ICD-10-CM | POA: Diagnosis not present

## 2024-01-18 DIAGNOSIS — E1122 Type 2 diabetes mellitus with diabetic chronic kidney disease: Secondary | ICD-10-CM | POA: Diagnosis not present

## 2024-01-18 DIAGNOSIS — N183 Chronic kidney disease, stage 3 unspecified: Secondary | ICD-10-CM | POA: Diagnosis not present

## 2024-01-18 DIAGNOSIS — I6990 Unspecified sequelae of unspecified cerebrovascular disease: Secondary | ICD-10-CM | POA: Diagnosis not present

## 2024-01-18 DIAGNOSIS — R296 Repeated falls: Secondary | ICD-10-CM | POA: Diagnosis not present

## 2024-01-18 DIAGNOSIS — S91301A Unspecified open wound, right foot, initial encounter: Secondary | ICD-10-CM | POA: Diagnosis not present

## 2024-01-20 DIAGNOSIS — F321 Major depressive disorder, single episode, moderate: Secondary | ICD-10-CM | POA: Diagnosis not present

## 2024-01-20 DIAGNOSIS — R2689 Other abnormalities of gait and mobility: Secondary | ICD-10-CM | POA: Diagnosis not present

## 2024-01-20 DIAGNOSIS — M6281 Muscle weakness (generalized): Secondary | ICD-10-CM | POA: Diagnosis not present

## 2024-01-20 DIAGNOSIS — F01A0 Vascular dementia, mild, without behavioral disturbance, psychotic disturbance, mood disturbance, and anxiety: Secondary | ICD-10-CM | POA: Diagnosis not present

## 2024-01-20 DIAGNOSIS — R278 Other lack of coordination: Secondary | ICD-10-CM | POA: Diagnosis not present

## 2024-01-20 DIAGNOSIS — I10 Essential (primary) hypertension: Secondary | ICD-10-CM | POA: Diagnosis not present

## 2024-01-21 DIAGNOSIS — R278 Other lack of coordination: Secondary | ICD-10-CM | POA: Diagnosis not present

## 2024-01-21 DIAGNOSIS — M6281 Muscle weakness (generalized): Secondary | ICD-10-CM | POA: Diagnosis not present

## 2024-01-21 DIAGNOSIS — R2689 Other abnormalities of gait and mobility: Secondary | ICD-10-CM | POA: Diagnosis not present

## 2024-01-23 DIAGNOSIS — M6281 Muscle weakness (generalized): Secondary | ICD-10-CM | POA: Diagnosis not present

## 2024-01-23 DIAGNOSIS — R2689 Other abnormalities of gait and mobility: Secondary | ICD-10-CM | POA: Diagnosis not present

## 2024-01-23 DIAGNOSIS — R278 Other lack of coordination: Secondary | ICD-10-CM | POA: Diagnosis not present

## 2024-01-24 DIAGNOSIS — R2689 Other abnormalities of gait and mobility: Secondary | ICD-10-CM | POA: Diagnosis not present

## 2024-01-24 DIAGNOSIS — M6281 Muscle weakness (generalized): Secondary | ICD-10-CM | POA: Diagnosis not present

## 2024-01-24 DIAGNOSIS — R278 Other lack of coordination: Secondary | ICD-10-CM | POA: Diagnosis not present

## 2024-01-25 DIAGNOSIS — S91301A Unspecified open wound, right foot, initial encounter: Secondary | ICD-10-CM | POA: Diagnosis not present

## 2024-01-25 DIAGNOSIS — R278 Other lack of coordination: Secondary | ICD-10-CM | POA: Diagnosis not present

## 2024-01-25 DIAGNOSIS — M6281 Muscle weakness (generalized): Secondary | ICD-10-CM | POA: Diagnosis not present

## 2024-01-25 DIAGNOSIS — R2689 Other abnormalities of gait and mobility: Secondary | ICD-10-CM | POA: Diagnosis not present

## 2024-01-26 DIAGNOSIS — I739 Peripheral vascular disease, unspecified: Secondary | ICD-10-CM | POA: Diagnosis not present

## 2024-01-26 DIAGNOSIS — I10 Essential (primary) hypertension: Secondary | ICD-10-CM | POA: Diagnosis not present

## 2024-01-26 DIAGNOSIS — F01A0 Vascular dementia, mild, without behavioral disturbance, psychotic disturbance, mood disturbance, and anxiety: Secondary | ICD-10-CM | POA: Diagnosis not present

## 2024-01-26 DIAGNOSIS — F321 Major depressive disorder, single episode, moderate: Secondary | ICD-10-CM | POA: Diagnosis not present

## 2024-01-26 DIAGNOSIS — R0989 Other specified symptoms and signs involving the circulatory and respiratory systems: Secondary | ICD-10-CM | POA: Diagnosis not present

## 2024-01-27 DIAGNOSIS — F01A0 Vascular dementia, mild, without behavioral disturbance, psychotic disturbance, mood disturbance, and anxiety: Secondary | ICD-10-CM | POA: Diagnosis not present

## 2024-01-27 DIAGNOSIS — F321 Major depressive disorder, single episode, moderate: Secondary | ICD-10-CM | POA: Diagnosis not present

## 2024-01-28 DIAGNOSIS — I10 Essential (primary) hypertension: Secondary | ICD-10-CM | POA: Diagnosis not present

## 2024-01-29 DIAGNOSIS — M6281 Muscle weakness (generalized): Secondary | ICD-10-CM | POA: Diagnosis not present

## 2024-01-29 DIAGNOSIS — R278 Other lack of coordination: Secondary | ICD-10-CM | POA: Diagnosis not present

## 2024-01-29 DIAGNOSIS — R2689 Other abnormalities of gait and mobility: Secondary | ICD-10-CM | POA: Diagnosis not present

## 2024-01-30 DIAGNOSIS — M6281 Muscle weakness (generalized): Secondary | ICD-10-CM | POA: Diagnosis not present

## 2024-01-30 DIAGNOSIS — R278 Other lack of coordination: Secondary | ICD-10-CM | POA: Diagnosis not present

## 2024-01-30 DIAGNOSIS — R2689 Other abnormalities of gait and mobility: Secondary | ICD-10-CM | POA: Diagnosis not present

## 2024-02-01 DIAGNOSIS — S91301A Unspecified open wound, right foot, initial encounter: Secondary | ICD-10-CM | POA: Diagnosis not present

## 2024-02-02 DIAGNOSIS — E119 Type 2 diabetes mellitus without complications: Secondary | ICD-10-CM | POA: Diagnosis not present

## 2024-02-02 DIAGNOSIS — H18513 Endothelial corneal dystrophy, bilateral: Secondary | ICD-10-CM | POA: Diagnosis not present

## 2024-02-04 DIAGNOSIS — L97519 Non-pressure chronic ulcer of other part of right foot with unspecified severity: Secondary | ICD-10-CM | POA: Diagnosis not present

## 2024-02-07 ENCOUNTER — Other Ambulatory Visit: Payer: Self-pay

## 2024-02-07 DIAGNOSIS — R2689 Other abnormalities of gait and mobility: Secondary | ICD-10-CM | POA: Diagnosis not present

## 2024-02-07 DIAGNOSIS — M6281 Muscle weakness (generalized): Secondary | ICD-10-CM | POA: Diagnosis not present

## 2024-02-07 DIAGNOSIS — I739 Peripheral vascular disease, unspecified: Secondary | ICD-10-CM

## 2024-02-07 DIAGNOSIS — R278 Other lack of coordination: Secondary | ICD-10-CM | POA: Diagnosis not present

## 2024-02-08 DIAGNOSIS — I10 Essential (primary) hypertension: Secondary | ICD-10-CM | POA: Diagnosis not present

## 2024-02-08 DIAGNOSIS — E1122 Type 2 diabetes mellitus with diabetic chronic kidney disease: Secondary | ICD-10-CM | POA: Diagnosis not present

## 2024-02-08 DIAGNOSIS — N183 Chronic kidney disease, stage 3 unspecified: Secondary | ICD-10-CM | POA: Diagnosis not present

## 2024-02-08 DIAGNOSIS — I6990 Unspecified sequelae of unspecified cerebrovascular disease: Secondary | ICD-10-CM | POA: Diagnosis not present

## 2024-02-08 DIAGNOSIS — S91301A Unspecified open wound, right foot, initial encounter: Secondary | ICD-10-CM | POA: Diagnosis not present

## 2024-02-10 DIAGNOSIS — R278 Other lack of coordination: Secondary | ICD-10-CM | POA: Diagnosis not present

## 2024-02-10 DIAGNOSIS — R2689 Other abnormalities of gait and mobility: Secondary | ICD-10-CM | POA: Diagnosis not present

## 2024-02-10 DIAGNOSIS — M6281 Muscle weakness (generalized): Secondary | ICD-10-CM | POA: Diagnosis not present

## 2024-02-11 ENCOUNTER — Ambulatory Visit (HOSPITAL_COMMUNITY)
Admission: RE | Admit: 2024-02-11 | Discharge: 2024-02-11 | Disposition: A | Discharge status: Home / Self Care | Source: Ambulatory Visit | Attending: Vascular Surgery | Admitting: Vascular Surgery

## 2024-02-11 ENCOUNTER — Encounter (HOSPITAL_COMMUNITY): Payer: Self-pay

## 2024-02-11 DIAGNOSIS — R059 Cough, unspecified: Secondary | ICD-10-CM | POA: Diagnosis not present

## 2024-02-11 DIAGNOSIS — R0989 Other specified symptoms and signs involving the circulatory and respiratory systems: Secondary | ICD-10-CM | POA: Diagnosis not present

## 2024-02-11 DIAGNOSIS — I739 Peripheral vascular disease, unspecified: Secondary | ICD-10-CM

## 2024-02-12 DIAGNOSIS — R278 Other lack of coordination: Secondary | ICD-10-CM | POA: Diagnosis not present

## 2024-02-12 DIAGNOSIS — R2689 Other abnormalities of gait and mobility: Secondary | ICD-10-CM | POA: Diagnosis not present

## 2024-02-12 DIAGNOSIS — M6281 Muscle weakness (generalized): Secondary | ICD-10-CM | POA: Diagnosis not present

## 2024-02-14 ENCOUNTER — Telehealth: Payer: Self-pay | Admitting: Internal Medicine

## 2024-02-14 NOTE — Telephone Encounter (Signed)
 Contacted Ryan Duke to schedule their annual wellness visit. Patient declined to schedule AWV at this time. Transferred care to [facility].  [facility] Care Guide [facility] AWV TEAM Direct Dial: [phone]

## 2024-02-15 DIAGNOSIS — I10 Essential (primary) hypertension: Secondary | ICD-10-CM | POA: Diagnosis not present

## 2024-02-15 DIAGNOSIS — I6990 Unspecified sequelae of unspecified cerebrovascular disease: Secondary | ICD-10-CM | POA: Diagnosis not present

## 2024-02-15 DIAGNOSIS — E1122 Type 2 diabetes mellitus with diabetic chronic kidney disease: Secondary | ICD-10-CM | POA: Diagnosis not present

## 2024-02-15 DIAGNOSIS — N183 Chronic kidney disease, stage 3 unspecified: Secondary | ICD-10-CM | POA: Diagnosis not present

## 2024-02-24 DIAGNOSIS — F321 Major depressive disorder, single episode, moderate: Secondary | ICD-10-CM | POA: Diagnosis not present

## 2024-02-24 DIAGNOSIS — F01A0 Vascular dementia, mild, without behavioral disturbance, psychotic disturbance, mood disturbance, and anxiety: Secondary | ICD-10-CM | POA: Diagnosis not present

## 2024-03-02 ENCOUNTER — Encounter: Admitting: Vascular Surgery

## 2024-03-07 DIAGNOSIS — I10 Essential (primary) hypertension: Secondary | ICD-10-CM | POA: Diagnosis not present

## 2024-03-07 DIAGNOSIS — F321 Major depressive disorder, single episode, moderate: Secondary | ICD-10-CM | POA: Diagnosis not present

## 2024-03-07 DIAGNOSIS — F01A0 Vascular dementia, mild, without behavioral disturbance, psychotic disturbance, mood disturbance, and anxiety: Secondary | ICD-10-CM | POA: Diagnosis not present

## 2024-03-10 ENCOUNTER — Ambulatory Visit (HOSPITAL_COMMUNITY): Admission: RE | Admit: 2024-03-10 | Source: Ambulatory Visit

## 2024-03-14 DIAGNOSIS — I10 Essential (primary) hypertension: Secondary | ICD-10-CM | POA: Diagnosis not present

## 2024-03-14 DIAGNOSIS — E1122 Type 2 diabetes mellitus with diabetic chronic kidney disease: Secondary | ICD-10-CM | POA: Diagnosis not present

## 2024-03-14 DIAGNOSIS — I6990 Unspecified sequelae of unspecified cerebrovascular disease: Secondary | ICD-10-CM | POA: Diagnosis not present

## 2024-03-14 DIAGNOSIS — N183 Chronic kidney disease, stage 3 unspecified: Secondary | ICD-10-CM | POA: Diagnosis not present

## 2024-03-16 ENCOUNTER — Encounter (HOSPITAL_COMMUNITY)

## 2024-03-16 ENCOUNTER — Other Ambulatory Visit: Payer: Self-pay

## 2024-03-16 DIAGNOSIS — F01A0 Vascular dementia, mild, without behavioral disturbance, psychotic disturbance, mood disturbance, and anxiety: Secondary | ICD-10-CM | POA: Diagnosis not present

## 2024-03-16 DIAGNOSIS — F321 Major depressive disorder, single episode, moderate: Secondary | ICD-10-CM | POA: Diagnosis not present

## 2024-03-16 DIAGNOSIS — I739 Peripheral vascular disease, unspecified: Secondary | ICD-10-CM

## 2024-03-16 NOTE — Progress Notes (Signed)
 Patient ID: Ryan Duke, male   DOB: [birth year], 83 y.o.   MRN: [id number]  Reason for Consult: New Patient (Initial Visit)   Referred by Von Grumbling, MD  Subjective:     HPI  Ryan Duke Camey is a 83 y.o. male nursing home resident presenting for evaluation of a slow healing wound.  He had a right great toe MTP joint wound that his wife reports was red and draining for some time but has since healed.  He does not walk.  He denies rest pain.  He has a significant cardiac history with severe aortic stenosis and a TAVR done in 2023. 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 Arlester Ladd and Dr. Sherene Dilling   Severe aortic stenosis    Family History  Problem Relation Age of Onset   Hypertension Other    Past Surgical History:  Procedure Laterality Date   ALVEOLOPLASTY N/A 05/14/2022   Procedure: ALVEOLOPLASTY;  Surgeon: Rene Carrier, DMD;  Location: [facility] OR;  Service: Dentistry;  Laterality: N/A;   CARDIAC CATHETERIZATION     EYE SURGERY  2021   cataracts   FLEXIBLE SIGMOIDOSCOPY N/A 09/10/2017   Procedure: FLEXIBLE SIGMOIDOSCOPY;  Surgeon: Sergio Dandy, MD;  Location: [facility] ENDOSCOPY;  Service: Endoscopy;  Laterality: N/A;   IR KYPHO LUMBAR INC FX REDUCE BONE BX UNI/BIL CANNULATION INC/IMAGING  09/14/2017   IR RADIOLOGIST EVAL & MGMT  10/07/2017   RIGHT HEART CATH AND CORONARY ANGIOGRAPHY N/A 05/07/2022   Procedure: RIGHT HEART CATH AND CORONARY ANGIOGRAPHY;  Surgeon: Arnoldo Lapping, MD;  Location: [facility] INVASIVE CV LAB;  Service: Cardiovascular;  Laterality: N/A;   TEE WITHOUT CARDIOVERSION N/A 07/14/2022   Procedure:  TRANSESOPHAGEAL ECHOCARDIOGRAM (TEE);  Surgeon: Arnoldo Lapping, MD;  Location: [facility] INVASIVE CV LAB;  Service: Open Heart Surgery;  Laterality: N/A;   TONSILLECTOMY     TOOTH EXTRACTION N/A 05/14/2022   Procedure: DENTAL EXTRACTIONS TEETH NUMBER TWO, THREE, FOUR, FIVE, FOURTEEN, FIFTEEN, TWENTY;  Surgeon: Rene Carrier, DMD;  Location: [facility] OR;  Service: Dentistry;  Laterality: N/A;    Short Social History:  Social History   Tobacco Use   Smoking status: Former    Current packs/day: 0.00    Types: Cigarettes    Quit date: 11/30/1974    Years since quitting: 49.3    Passive exposure: Never   Smokeless tobacco: Never  Substance Use Topics   Alcohol use: Not Currently    Alcohol/week: 0.0 standard drinks of alcohol    Allergies  Allergen Reactions   Farxiga  [Dapagliflozin ] Other (See Comments)    Gout    Metformin And Related Other (See Comments)    "Acidosis"   Atorvastatin Other (See Comments)    Headaches    Benazepril Hcl Other (See Comments)    Was not effective   Flexeril  [Cyclobenzaprine ] Other (See Comments)    Hallucinations    Oxycodone  Other (See Comments)    Hallucinations     Current Outpatient Medications  Medication Sig Dispense Refill   acetaminophen  (TYLENOL ) 325 MG tablet Take 650  mg by mouth every 6 (six) hours as needed for mild pain or moderate pain (Uses as needed).     apixaban  (ELIQUIS ) 2.5 MG TABS tablet Take 1 tablet (2.5 mg total) by mouth 2 (two) times daily. 60 tablet 0   atenolol  (TENORMIN ) 50 MG tablet Take 1 tablet (50 mg total) by mouth daily. 30 tablet 0   FLUoxetine (PROZAC) 10 MG capsule Take 10 mg by mouth daily.     fluticasone  (FLONASE) 50 MCG/ACT nasal spray Place 1 spray into both nostrils daily as needed for allergies or rhinitis.     insulin  aspart (NOVOLOG ) 100 UNIT/ML injection Inject 0-9 Units into the skin 3 (three) times daily with meals. 10 mL 11   insulin  glargine (LANTUS  SOLOSTAR) 100 UNIT/ML Solostar Pen Inject 22 Units  into the skin daily. 27 mL 0   JARDIANCE  25 MG TABS tablet TAKE ONE TABLET BY MOUTH ONCE DAILY BEFORE BREAKFAST 90 tablet 0   KERENDIA  20 MG TABS Take 1 tablet by mouth daily. 90 tablet 0   losartan  (COZAAR ) 100 MG tablet Take 1 tablet (100 mg total) by mouth daily for high blood pressure 90 tablet 1   Multiple Vitamin (MULTIVITAMIN WITH MINERALS) TABS tablet Take 1 tablet by mouth daily.     Multiple Vitamins-Minerals (PRESERVISION AREDS PO) Take 1 tablet by mouth in the morning and at bedtime.     rosuvastatin  (CRESTOR ) 20 MG tablet Take 1 tablet (20 mg total) by mouth daily.     senna-docusate (SENOKOT-S) 8.6-50 MG tablet Take 1 tablet by mouth at bedtime as needed for mild constipation.     tamsulosin  (FLOMAX ) 0.4 MG CAPS capsule TAKE ONE CAPSULE BY MOUTH DAILY 90 capsule 1   vitamin B-12 (CYANOCOBALAMIN ) 500 MCG tablet Take 500 mcg by mouth daily.     amLODipine  (NORVASC ) 10 MG tablet Take 1 tablet (10 mg total) by mouth daily. (Patient not taking: Reported on 03/17/2024)     DULoxetine  (CYMBALTA ) 60 MG capsule TAKE ONE CAPSULE DAILY (Patient not taking: Reported on 03/17/2024) 90 capsule 1   No current facility-administered medications for this visit.    REVIEW OF SYSTEMS  All other systems were reviewed and are negative     Objective:  Objective   Vitals:   03/17/24 1025  BP: (!) 180/71  Pulse: 69  Resp: 20  Temp: 98.2 F (36.8 C)  TempSrc: Temporal  SpO2: 90%  Weight: (!) 381 lb (172.8 kg)  Height: 6' (1.829 m)   Body mass index is 51.67 kg/m.  Physical Exam General: no acute distress Cardiac: hemodynamically stable Pulm: normal work of breathing Neuro: alert, no focal deficit Extremities: Right foot MTP joint with healed wound, scarred, no other wounds noted.  No edema or cyanosis. Vascular:   Right: Palpable DP  Left: Palpable DP   Data: ABI +---------+------------------+-----+---------+--------+  Right   Rt Pressure (mmHg)IndexWaveform Comment    +---------+------------------+-----+---------+--------+  Brachial 200                                       +---------+------------------+-----+---------+--------+  PTA     205               1.01 biphasic           +---------+------------------+-----+---------+--------+  DP      245               1.21 triphasic          +---------+------------------+-----+---------+--------+  Great Toe98                0.48                    +---------+------------------+-----+---------+--------+   +---------+------------------+-----+---------+-------+  Left    Lt Pressure (mmHg)IndexWaveform Comment  +---------+------------------+-----+---------+-------+  Brachial 203                                      +---------+------------------+-----+---------+-------+  PTA     230               1.13 triphasic         +---------+------------------+-----+---------+-------+  DP      239               1.18 biphasic          +---------+------------------+-----+---------+-------+  Great Toe111               0.55                   +---------+------------------+-----+---------+-------+   +-------+-----------+-----------+------------+------------+  ABI/TBIToday's ABIToday's TBIPrevious ABIPrevious TBI  +-------+-----------+-----------+------------+------------+  Right 1.21       0.48                                 +-------+-----------+-----------+------------+------------+  Left  1.18       0.55                                 +-------+-----------+-----------+------------+------------+        Assessment/Plan:     BRIGIDO MERA is a 83 y.o. male who had a slow healing wound on his right great toe.  This has since healed.  I explained that he does not have PAD based on his physical exam or his ABI.  I explained that he just needs to continue with local wound care and focus on pressure offloading as he is not ambulatory and is a  diabetic.  Follow-up as needed     Philipp Brawn MD [facility]

## 2024-03-17 ENCOUNTER — Ambulatory Visit (INDEPENDENT_AMBULATORY_CARE_PROVIDER_SITE_OTHER): Admitting: Vascular Surgery

## 2024-03-17 ENCOUNTER — Encounter: Payer: Self-pay | Admitting: Vascular Surgery

## 2024-03-17 ENCOUNTER — Ambulatory Visit (HOSPITAL_COMMUNITY)
Admission: RE | Admit: 2024-03-17 | Discharge: 2024-03-17 | Disposition: A | Discharge status: Home / Self Care | Source: Ambulatory Visit | Attending: Vascular Surgery | Admitting: Vascular Surgery

## 2024-03-17 VITALS — BP 180/71 | HR 69 | Temp 98.2°F | Resp 20 | Ht 72.0 in | Wt 381.0 lb

## 2024-03-17 DIAGNOSIS — E559 Vitamin D deficiency, unspecified: Secondary | ICD-10-CM | POA: Diagnosis not present

## 2024-03-17 DIAGNOSIS — I739 Peripheral vascular disease, unspecified: Secondary | ICD-10-CM | POA: Diagnosis not present

## 2024-03-17 DIAGNOSIS — E1369 Other specified diabetes mellitus with other specified complication: Secondary | ICD-10-CM | POA: Diagnosis not present

## 2024-03-17 DIAGNOSIS — R739 Hyperglycemia, unspecified: Secondary | ICD-10-CM | POA: Diagnosis not present

## 2024-03-17 DIAGNOSIS — I10 Essential (primary) hypertension: Secondary | ICD-10-CM | POA: Diagnosis not present

## 2024-03-17 LAB — VAS US ABI WITH/WO TBI
Left ABI: 1.18
Right ABI: 1.21

## 2024-04-04 DIAGNOSIS — I10 Essential (primary) hypertension: Secondary | ICD-10-CM | POA: Diagnosis not present

## 2024-04-04 DIAGNOSIS — F01A0 Vascular dementia, mild, without behavioral disturbance, psychotic disturbance, mood disturbance, and anxiety: Secondary | ICD-10-CM | POA: Diagnosis not present

## 2024-04-04 DIAGNOSIS — F321 Major depressive disorder, single episode, moderate: Secondary | ICD-10-CM | POA: Diagnosis not present

## 2024-04-11 DIAGNOSIS — I6930 Unspecified sequelae of cerebral infarction: Secondary | ICD-10-CM | POA: Diagnosis not present

## 2024-04-11 DIAGNOSIS — E1122 Type 2 diabetes mellitus with diabetic chronic kidney disease: Secondary | ICD-10-CM | POA: Diagnosis not present

## 2024-04-11 DIAGNOSIS — I130 Hypertensive heart and chronic kidney disease with heart failure and stage 1 through stage 4 chronic kidney disease, or unspecified chronic kidney disease: Secondary | ICD-10-CM | POA: Diagnosis not present

## 2024-04-11 DIAGNOSIS — E559 Vitamin D deficiency, unspecified: Secondary | ICD-10-CM | POA: Diagnosis not present

## 2024-04-17 DIAGNOSIS — M6259 Muscle wasting and atrophy, not elsewhere classified, multiple sites: Secondary | ICD-10-CM | POA: Diagnosis not present

## 2024-04-17 DIAGNOSIS — I639 Cerebral infarction, unspecified: Secondary | ICD-10-CM | POA: Diagnosis not present

## 2024-04-17 DIAGNOSIS — N1831 Chronic kidney disease, stage 3a: Secondary | ICD-10-CM | POA: Diagnosis not present

## 2024-04-17 DIAGNOSIS — Z9181 History of falling: Secondary | ICD-10-CM | POA: Diagnosis not present

## 2024-04-17 DIAGNOSIS — E785 Hyperlipidemia, unspecified: Secondary | ICD-10-CM | POA: Diagnosis not present

## 2024-04-17 DIAGNOSIS — F01B4 Vascular dementia, moderate, with anxiety: Secondary | ICD-10-CM | POA: Diagnosis not present

## 2024-04-17 DIAGNOSIS — R41841 Cognitive communication deficit: Secondary | ICD-10-CM | POA: Diagnosis not present

## 2024-04-17 DIAGNOSIS — M6281 Muscle weakness (generalized): Secondary | ICD-10-CM | POA: Diagnosis not present

## 2024-04-17 DIAGNOSIS — R488 Other symbolic dysfunctions: Secondary | ICD-10-CM | POA: Diagnosis not present

## 2024-04-18 DIAGNOSIS — R488 Other symbolic dysfunctions: Secondary | ICD-10-CM | POA: Diagnosis not present

## 2024-04-18 DIAGNOSIS — E1122 Type 2 diabetes mellitus with diabetic chronic kidney disease: Secondary | ICD-10-CM | POA: Diagnosis not present

## 2024-04-18 DIAGNOSIS — F01B4 Vascular dementia, moderate, with anxiety: Secondary | ICD-10-CM | POA: Diagnosis not present

## 2024-04-18 DIAGNOSIS — M6281 Muscle weakness (generalized): Secondary | ICD-10-CM | POA: Diagnosis not present

## 2024-04-18 DIAGNOSIS — I6930 Unspecified sequelae of cerebral infarction: Secondary | ICD-10-CM | POA: Diagnosis not present

## 2024-04-18 DIAGNOSIS — N1831 Chronic kidney disease, stage 3a: Secondary | ICD-10-CM | POA: Diagnosis not present

## 2024-04-18 DIAGNOSIS — I130 Hypertensive heart and chronic kidney disease with heart failure and stage 1 through stage 4 chronic kidney disease, or unspecified chronic kidney disease: Secondary | ICD-10-CM | POA: Diagnosis not present

## 2024-04-18 DIAGNOSIS — F321 Major depressive disorder, single episode, moderate: Secondary | ICD-10-CM | POA: Diagnosis not present

## 2024-04-18 DIAGNOSIS — M6259 Muscle wasting and atrophy, not elsewhere classified, multiple sites: Secondary | ICD-10-CM | POA: Diagnosis not present

## 2024-04-18 DIAGNOSIS — E785 Hyperlipidemia, unspecified: Secondary | ICD-10-CM | POA: Diagnosis not present

## 2024-04-18 DIAGNOSIS — R41841 Cognitive communication deficit: Secondary | ICD-10-CM | POA: Diagnosis not present

## 2024-04-18 DIAGNOSIS — Z9181 History of falling: Secondary | ICD-10-CM | POA: Diagnosis not present

## 2024-04-18 DIAGNOSIS — I639 Cerebral infarction, unspecified: Secondary | ICD-10-CM | POA: Diagnosis not present

## 2024-04-18 DIAGNOSIS — F01A0 Vascular dementia, mild, without behavioral disturbance, psychotic disturbance, mood disturbance, and anxiety: Secondary | ICD-10-CM | POA: Diagnosis not present

## 2024-04-18 DIAGNOSIS — E559 Vitamin D deficiency, unspecified: Secondary | ICD-10-CM | POA: Diagnosis not present

## 2024-04-19 DIAGNOSIS — F01B4 Vascular dementia, moderate, with anxiety: Secondary | ICD-10-CM | POA: Diagnosis not present

## 2024-04-19 DIAGNOSIS — Z9181 History of falling: Secondary | ICD-10-CM | POA: Diagnosis not present

## 2024-04-19 DIAGNOSIS — M6259 Muscle wasting and atrophy, not elsewhere classified, multiple sites: Secondary | ICD-10-CM | POA: Diagnosis not present

## 2024-04-19 DIAGNOSIS — I639 Cerebral infarction, unspecified: Secondary | ICD-10-CM | POA: Diagnosis not present

## 2024-04-19 DIAGNOSIS — R41841 Cognitive communication deficit: Secondary | ICD-10-CM | POA: Diagnosis not present

## 2024-04-19 DIAGNOSIS — R488 Other symbolic dysfunctions: Secondary | ICD-10-CM | POA: Diagnosis not present

## 2024-04-19 DIAGNOSIS — M6281 Muscle weakness (generalized): Secondary | ICD-10-CM | POA: Diagnosis not present

## 2024-04-19 DIAGNOSIS — N1831 Chronic kidney disease, stage 3a: Secondary | ICD-10-CM | POA: Diagnosis not present

## 2024-04-19 DIAGNOSIS — E785 Hyperlipidemia, unspecified: Secondary | ICD-10-CM | POA: Diagnosis not present

## 2024-04-20 DIAGNOSIS — I639 Cerebral infarction, unspecified: Secondary | ICD-10-CM | POA: Diagnosis not present

## 2024-04-20 DIAGNOSIS — R488 Other symbolic dysfunctions: Secondary | ICD-10-CM | POA: Diagnosis not present

## 2024-04-20 DIAGNOSIS — M6281 Muscle weakness (generalized): Secondary | ICD-10-CM | POA: Diagnosis not present

## 2024-04-20 DIAGNOSIS — N1831 Chronic kidney disease, stage 3a: Secondary | ICD-10-CM | POA: Diagnosis not present

## 2024-04-20 DIAGNOSIS — R41841 Cognitive communication deficit: Secondary | ICD-10-CM | POA: Diagnosis not present

## 2024-04-20 DIAGNOSIS — F01B4 Vascular dementia, moderate, with anxiety: Secondary | ICD-10-CM | POA: Diagnosis not present

## 2024-04-20 DIAGNOSIS — M6259 Muscle wasting and atrophy, not elsewhere classified, multiple sites: Secondary | ICD-10-CM | POA: Diagnosis not present

## 2024-04-20 DIAGNOSIS — Z9181 History of falling: Secondary | ICD-10-CM | POA: Diagnosis not present

## 2024-04-20 DIAGNOSIS — E785 Hyperlipidemia, unspecified: Secondary | ICD-10-CM | POA: Diagnosis not present

## 2024-04-21 DIAGNOSIS — R41841 Cognitive communication deficit: Secondary | ICD-10-CM | POA: Diagnosis not present

## 2024-04-21 DIAGNOSIS — N1831 Chronic kidney disease, stage 3a: Secondary | ICD-10-CM | POA: Diagnosis not present

## 2024-04-21 DIAGNOSIS — F01B4 Vascular dementia, moderate, with anxiety: Secondary | ICD-10-CM | POA: Diagnosis not present

## 2024-04-21 DIAGNOSIS — E785 Hyperlipidemia, unspecified: Secondary | ICD-10-CM | POA: Diagnosis not present

## 2024-04-21 DIAGNOSIS — Z9181 History of falling: Secondary | ICD-10-CM | POA: Diagnosis not present

## 2024-04-21 DIAGNOSIS — R488 Other symbolic dysfunctions: Secondary | ICD-10-CM | POA: Diagnosis not present

## 2024-04-21 DIAGNOSIS — M6281 Muscle weakness (generalized): Secondary | ICD-10-CM | POA: Diagnosis not present

## 2024-04-21 DIAGNOSIS — M6259 Muscle wasting and atrophy, not elsewhere classified, multiple sites: Secondary | ICD-10-CM | POA: Diagnosis not present

## 2024-04-21 DIAGNOSIS — I639 Cerebral infarction, unspecified: Secondary | ICD-10-CM | POA: Diagnosis not present

## 2024-04-24 DIAGNOSIS — R488 Other symbolic dysfunctions: Secondary | ICD-10-CM | POA: Diagnosis not present

## 2024-04-24 DIAGNOSIS — M6259 Muscle wasting and atrophy, not elsewhere classified, multiple sites: Secondary | ICD-10-CM | POA: Diagnosis not present

## 2024-04-24 DIAGNOSIS — Z9181 History of falling: Secondary | ICD-10-CM | POA: Diagnosis not present

## 2024-04-24 DIAGNOSIS — I639 Cerebral infarction, unspecified: Secondary | ICD-10-CM | POA: Diagnosis not present

## 2024-04-24 DIAGNOSIS — F01B4 Vascular dementia, moderate, with anxiety: Secondary | ICD-10-CM | POA: Diagnosis not present

## 2024-04-24 DIAGNOSIS — R41841 Cognitive communication deficit: Secondary | ICD-10-CM | POA: Diagnosis not present

## 2024-04-24 DIAGNOSIS — N1831 Chronic kidney disease, stage 3a: Secondary | ICD-10-CM | POA: Diagnosis not present

## 2024-04-24 DIAGNOSIS — M6281 Muscle weakness (generalized): Secondary | ICD-10-CM | POA: Diagnosis not present

## 2024-04-24 DIAGNOSIS — E785 Hyperlipidemia, unspecified: Secondary | ICD-10-CM | POA: Diagnosis not present

## 2024-04-25 DIAGNOSIS — R41841 Cognitive communication deficit: Secondary | ICD-10-CM | POA: Diagnosis not present

## 2024-04-25 DIAGNOSIS — I1310 Hypertensive heart and chronic kidney disease without heart failure, with stage 1 through stage 4 chronic kidney disease, or unspecified chronic kidney disease: Secondary | ICD-10-CM | POA: Diagnosis not present

## 2024-04-25 DIAGNOSIS — M6281 Muscle weakness (generalized): Secondary | ICD-10-CM | POA: Diagnosis not present

## 2024-04-25 DIAGNOSIS — I639 Cerebral infarction, unspecified: Secondary | ICD-10-CM | POA: Diagnosis not present

## 2024-04-25 DIAGNOSIS — F01B4 Vascular dementia, moderate, with anxiety: Secondary | ICD-10-CM | POA: Diagnosis not present

## 2024-04-25 DIAGNOSIS — R296 Repeated falls: Secondary | ICD-10-CM | POA: Diagnosis not present

## 2024-04-25 DIAGNOSIS — E119 Type 2 diabetes mellitus without complications: Secondary | ICD-10-CM | POA: Diagnosis not present

## 2024-04-25 DIAGNOSIS — E785 Hyperlipidemia, unspecified: Secondary | ICD-10-CM | POA: Diagnosis not present

## 2024-04-25 DIAGNOSIS — N1831 Chronic kidney disease, stage 3a: Secondary | ICD-10-CM | POA: Diagnosis not present

## 2024-04-25 DIAGNOSIS — R488 Other symbolic dysfunctions: Secondary | ICD-10-CM | POA: Diagnosis not present

## 2024-04-25 DIAGNOSIS — Z9181 History of falling: Secondary | ICD-10-CM | POA: Diagnosis not present

## 2024-04-25 DIAGNOSIS — M6259 Muscle wasting and atrophy, not elsewhere classified, multiple sites: Secondary | ICD-10-CM | POA: Diagnosis not present

## 2024-04-26 DIAGNOSIS — R41841 Cognitive communication deficit: Secondary | ICD-10-CM | POA: Diagnosis not present

## 2024-04-26 DIAGNOSIS — M6281 Muscle weakness (generalized): Secondary | ICD-10-CM | POA: Diagnosis not present

## 2024-04-26 DIAGNOSIS — M6259 Muscle wasting and atrophy, not elsewhere classified, multiple sites: Secondary | ICD-10-CM | POA: Diagnosis not present

## 2024-04-26 DIAGNOSIS — Z9181 History of falling: Secondary | ICD-10-CM | POA: Diagnosis not present

## 2024-04-26 DIAGNOSIS — F01B4 Vascular dementia, moderate, with anxiety: Secondary | ICD-10-CM | POA: Diagnosis not present

## 2024-04-26 DIAGNOSIS — E785 Hyperlipidemia, unspecified: Secondary | ICD-10-CM | POA: Diagnosis not present

## 2024-04-26 DIAGNOSIS — I639 Cerebral infarction, unspecified: Secondary | ICD-10-CM | POA: Diagnosis not present

## 2024-04-26 DIAGNOSIS — R488 Other symbolic dysfunctions: Secondary | ICD-10-CM | POA: Diagnosis not present

## 2024-04-26 DIAGNOSIS — N1831 Chronic kidney disease, stage 3a: Secondary | ICD-10-CM | POA: Diagnosis not present

## 2024-04-27 DIAGNOSIS — F01B4 Vascular dementia, moderate, with anxiety: Secondary | ICD-10-CM | POA: Diagnosis not present

## 2024-04-27 DIAGNOSIS — R488 Other symbolic dysfunctions: Secondary | ICD-10-CM | POA: Diagnosis not present

## 2024-04-27 DIAGNOSIS — I639 Cerebral infarction, unspecified: Secondary | ICD-10-CM | POA: Diagnosis not present

## 2024-04-27 DIAGNOSIS — Z9181 History of falling: Secondary | ICD-10-CM | POA: Diagnosis not present

## 2024-04-27 DIAGNOSIS — M6259 Muscle wasting and atrophy, not elsewhere classified, multiple sites: Secondary | ICD-10-CM | POA: Diagnosis not present

## 2024-04-27 DIAGNOSIS — E785 Hyperlipidemia, unspecified: Secondary | ICD-10-CM | POA: Diagnosis not present

## 2024-04-27 DIAGNOSIS — R41841 Cognitive communication deficit: Secondary | ICD-10-CM | POA: Diagnosis not present

## 2024-04-27 DIAGNOSIS — N1831 Chronic kidney disease, stage 3a: Secondary | ICD-10-CM | POA: Diagnosis not present

## 2024-04-27 DIAGNOSIS — M6281 Muscle weakness (generalized): Secondary | ICD-10-CM | POA: Diagnosis not present

## 2024-04-28 DIAGNOSIS — N1831 Chronic kidney disease, stage 3a: Secondary | ICD-10-CM | POA: Diagnosis not present

## 2024-04-28 DIAGNOSIS — R41841 Cognitive communication deficit: Secondary | ICD-10-CM | POA: Diagnosis not present

## 2024-04-28 DIAGNOSIS — Z9181 History of falling: Secondary | ICD-10-CM | POA: Diagnosis not present

## 2024-04-28 DIAGNOSIS — F01B4 Vascular dementia, moderate, with anxiety: Secondary | ICD-10-CM | POA: Diagnosis not present

## 2024-04-28 DIAGNOSIS — M6281 Muscle weakness (generalized): Secondary | ICD-10-CM | POA: Diagnosis not present

## 2024-04-28 DIAGNOSIS — R488 Other symbolic dysfunctions: Secondary | ICD-10-CM | POA: Diagnosis not present

## 2024-04-28 DIAGNOSIS — E785 Hyperlipidemia, unspecified: Secondary | ICD-10-CM | POA: Diagnosis not present

## 2024-04-28 DIAGNOSIS — I639 Cerebral infarction, unspecified: Secondary | ICD-10-CM | POA: Diagnosis not present

## 2024-04-28 DIAGNOSIS — M6259 Muscle wasting and atrophy, not elsewhere classified, multiple sites: Secondary | ICD-10-CM | POA: Diagnosis not present

## 2024-04-29 DIAGNOSIS — R488 Other symbolic dysfunctions: Secondary | ICD-10-CM | POA: Diagnosis not present

## 2024-04-29 DIAGNOSIS — M6259 Muscle wasting and atrophy, not elsewhere classified, multiple sites: Secondary | ICD-10-CM | POA: Diagnosis not present

## 2024-04-29 DIAGNOSIS — R41841 Cognitive communication deficit: Secondary | ICD-10-CM | POA: Diagnosis not present

## 2024-04-29 DIAGNOSIS — E785 Hyperlipidemia, unspecified: Secondary | ICD-10-CM | POA: Diagnosis not present

## 2024-04-29 DIAGNOSIS — N1831 Chronic kidney disease, stage 3a: Secondary | ICD-10-CM | POA: Diagnosis not present

## 2024-04-29 DIAGNOSIS — I639 Cerebral infarction, unspecified: Secondary | ICD-10-CM | POA: Diagnosis not present

## 2024-04-29 DIAGNOSIS — M6281 Muscle weakness (generalized): Secondary | ICD-10-CM | POA: Diagnosis not present

## 2024-04-29 DIAGNOSIS — F01B4 Vascular dementia, moderate, with anxiety: Secondary | ICD-10-CM | POA: Diagnosis not present

## 2024-04-29 DIAGNOSIS — Z9181 History of falling: Secondary | ICD-10-CM | POA: Diagnosis not present

## 2024-05-01 DIAGNOSIS — R488 Other symbolic dysfunctions: Secondary | ICD-10-CM | POA: Diagnosis not present

## 2024-05-01 DIAGNOSIS — R41841 Cognitive communication deficit: Secondary | ICD-10-CM | POA: Diagnosis not present

## 2024-05-01 DIAGNOSIS — Z9181 History of falling: Secondary | ICD-10-CM | POA: Diagnosis not present

## 2024-05-01 DIAGNOSIS — E785 Hyperlipidemia, unspecified: Secondary | ICD-10-CM | POA: Diagnosis not present

## 2024-05-01 DIAGNOSIS — N1831 Chronic kidney disease, stage 3a: Secondary | ICD-10-CM | POA: Diagnosis not present

## 2024-05-01 DIAGNOSIS — M6259 Muscle wasting and atrophy, not elsewhere classified, multiple sites: Secondary | ICD-10-CM | POA: Diagnosis not present

## 2024-05-01 DIAGNOSIS — I639 Cerebral infarction, unspecified: Secondary | ICD-10-CM | POA: Diagnosis not present

## 2024-05-01 DIAGNOSIS — M6281 Muscle weakness (generalized): Secondary | ICD-10-CM | POA: Diagnosis not present

## 2024-05-02 DIAGNOSIS — E785 Hyperlipidemia, unspecified: Secondary | ICD-10-CM | POA: Diagnosis not present

## 2024-05-02 DIAGNOSIS — I639 Cerebral infarction, unspecified: Secondary | ICD-10-CM | POA: Diagnosis not present

## 2024-05-02 DIAGNOSIS — L988 Other specified disorders of the skin and subcutaneous tissue: Secondary | ICD-10-CM | POA: Diagnosis not present

## 2024-05-02 DIAGNOSIS — M6259 Muscle wasting and atrophy, not elsewhere classified, multiple sites: Secondary | ICD-10-CM | POA: Diagnosis not present

## 2024-05-02 DIAGNOSIS — M6281 Muscle weakness (generalized): Secondary | ICD-10-CM | POA: Diagnosis not present

## 2024-05-02 DIAGNOSIS — Z9181 History of falling: Secondary | ICD-10-CM | POA: Diagnosis not present

## 2024-05-02 DIAGNOSIS — R488 Other symbolic dysfunctions: Secondary | ICD-10-CM | POA: Diagnosis not present

## 2024-05-02 DIAGNOSIS — N1831 Chronic kidney disease, stage 3a: Secondary | ICD-10-CM | POA: Diagnosis not present

## 2024-05-02 DIAGNOSIS — R41841 Cognitive communication deficit: Secondary | ICD-10-CM | POA: Diagnosis not present

## 2024-05-03 DIAGNOSIS — R41841 Cognitive communication deficit: Secondary | ICD-10-CM | POA: Diagnosis not present

## 2024-05-03 DIAGNOSIS — R488 Other symbolic dysfunctions: Secondary | ICD-10-CM | POA: Diagnosis not present

## 2024-05-03 DIAGNOSIS — I639 Cerebral infarction, unspecified: Secondary | ICD-10-CM | POA: Diagnosis not present

## 2024-05-03 DIAGNOSIS — Z9181 History of falling: Secondary | ICD-10-CM | POA: Diagnosis not present

## 2024-05-03 DIAGNOSIS — N1831 Chronic kidney disease, stage 3a: Secondary | ICD-10-CM | POA: Diagnosis not present

## 2024-05-03 DIAGNOSIS — E785 Hyperlipidemia, unspecified: Secondary | ICD-10-CM | POA: Diagnosis not present

## 2024-05-03 DIAGNOSIS — M6259 Muscle wasting and atrophy, not elsewhere classified, multiple sites: Secondary | ICD-10-CM | POA: Diagnosis not present

## 2024-05-03 DIAGNOSIS — M6281 Muscle weakness (generalized): Secondary | ICD-10-CM | POA: Diagnosis not present

## 2024-05-04 DIAGNOSIS — M6281 Muscle weakness (generalized): Secondary | ICD-10-CM | POA: Diagnosis not present

## 2024-05-04 DIAGNOSIS — M6259 Muscle wasting and atrophy, not elsewhere classified, multiple sites: Secondary | ICD-10-CM | POA: Diagnosis not present

## 2024-05-04 DIAGNOSIS — R41841 Cognitive communication deficit: Secondary | ICD-10-CM | POA: Diagnosis not present

## 2024-05-04 DIAGNOSIS — N1831 Chronic kidney disease, stage 3a: Secondary | ICD-10-CM | POA: Diagnosis not present

## 2024-05-04 DIAGNOSIS — I639 Cerebral infarction, unspecified: Secondary | ICD-10-CM | POA: Diagnosis not present

## 2024-05-04 DIAGNOSIS — R488 Other symbolic dysfunctions: Secondary | ICD-10-CM | POA: Diagnosis not present

## 2024-05-04 DIAGNOSIS — Z9181 History of falling: Secondary | ICD-10-CM | POA: Diagnosis not present

## 2024-05-04 DIAGNOSIS — E785 Hyperlipidemia, unspecified: Secondary | ICD-10-CM | POA: Diagnosis not present

## 2024-05-05 DIAGNOSIS — S31811A Laceration without foreign body of right buttock, initial encounter: Secondary | ICD-10-CM | POA: Diagnosis not present

## 2024-05-05 DIAGNOSIS — R41841 Cognitive communication deficit: Secondary | ICD-10-CM | POA: Diagnosis not present

## 2024-05-05 DIAGNOSIS — M6259 Muscle wasting and atrophy, not elsewhere classified, multiple sites: Secondary | ICD-10-CM | POA: Diagnosis not present

## 2024-05-05 DIAGNOSIS — E785 Hyperlipidemia, unspecified: Secondary | ICD-10-CM | POA: Diagnosis not present

## 2024-05-05 DIAGNOSIS — N1831 Chronic kidney disease, stage 3a: Secondary | ICD-10-CM | POA: Diagnosis not present

## 2024-05-05 DIAGNOSIS — M6281 Muscle weakness (generalized): Secondary | ICD-10-CM | POA: Diagnosis not present

## 2024-05-05 DIAGNOSIS — Z9181 History of falling: Secondary | ICD-10-CM | POA: Diagnosis not present

## 2024-05-05 DIAGNOSIS — R488 Other symbolic dysfunctions: Secondary | ICD-10-CM | POA: Diagnosis not present

## 2024-05-05 DIAGNOSIS — I639 Cerebral infarction, unspecified: Secondary | ICD-10-CM | POA: Diagnosis not present

## 2024-05-08 DIAGNOSIS — M6259 Muscle wasting and atrophy, not elsewhere classified, multiple sites: Secondary | ICD-10-CM | POA: Diagnosis not present

## 2024-05-08 DIAGNOSIS — I639 Cerebral infarction, unspecified: Secondary | ICD-10-CM | POA: Diagnosis not present

## 2024-05-08 DIAGNOSIS — R41841 Cognitive communication deficit: Secondary | ICD-10-CM | POA: Diagnosis not present

## 2024-05-08 DIAGNOSIS — R488 Other symbolic dysfunctions: Secondary | ICD-10-CM | POA: Diagnosis not present

## 2024-05-08 DIAGNOSIS — E785 Hyperlipidemia, unspecified: Secondary | ICD-10-CM | POA: Diagnosis not present

## 2024-05-08 DIAGNOSIS — Z9181 History of falling: Secondary | ICD-10-CM | POA: Diagnosis not present

## 2024-05-08 DIAGNOSIS — N1831 Chronic kidney disease, stage 3a: Secondary | ICD-10-CM | POA: Diagnosis not present

## 2024-05-08 DIAGNOSIS — M6281 Muscle weakness (generalized): Secondary | ICD-10-CM | POA: Diagnosis not present

## 2024-05-09 DIAGNOSIS — M6259 Muscle wasting and atrophy, not elsewhere classified, multiple sites: Secondary | ICD-10-CM | POA: Diagnosis not present

## 2024-05-09 DIAGNOSIS — Z9181 History of falling: Secondary | ICD-10-CM | POA: Diagnosis not present

## 2024-05-09 DIAGNOSIS — E119 Type 2 diabetes mellitus without complications: Secondary | ICD-10-CM | POA: Diagnosis not present

## 2024-05-09 DIAGNOSIS — M6281 Muscle weakness (generalized): Secondary | ICD-10-CM | POA: Diagnosis not present

## 2024-05-09 DIAGNOSIS — R41841 Cognitive communication deficit: Secondary | ICD-10-CM | POA: Diagnosis not present

## 2024-05-09 DIAGNOSIS — E785 Hyperlipidemia, unspecified: Secondary | ICD-10-CM | POA: Diagnosis not present

## 2024-05-09 DIAGNOSIS — I639 Cerebral infarction, unspecified: Secondary | ICD-10-CM | POA: Diagnosis not present

## 2024-05-09 DIAGNOSIS — N183 Chronic kidney disease, stage 3 unspecified: Secondary | ICD-10-CM | POA: Diagnosis not present

## 2024-05-09 DIAGNOSIS — N1831 Chronic kidney disease, stage 3a: Secondary | ICD-10-CM | POA: Diagnosis not present

## 2024-05-09 DIAGNOSIS — R488 Other symbolic dysfunctions: Secondary | ICD-10-CM | POA: Diagnosis not present

## 2024-05-09 DIAGNOSIS — I6930 Unspecified sequelae of cerebral infarction: Secondary | ICD-10-CM | POA: Diagnosis not present

## 2024-05-09 DIAGNOSIS — I10 Essential (primary) hypertension: Secondary | ICD-10-CM | POA: Diagnosis not present

## 2024-05-09 DIAGNOSIS — I1310 Hypertensive heart and chronic kidney disease without heart failure, with stage 1 through stage 4 chronic kidney disease, or unspecified chronic kidney disease: Secondary | ICD-10-CM | POA: Diagnosis not present

## 2024-05-09 DIAGNOSIS — L988 Other specified disorders of the skin and subcutaneous tissue: Secondary | ICD-10-CM | POA: Diagnosis not present

## 2024-05-10 DIAGNOSIS — I639 Cerebral infarction, unspecified: Secondary | ICD-10-CM | POA: Diagnosis not present

## 2024-05-10 DIAGNOSIS — M6281 Muscle weakness (generalized): Secondary | ICD-10-CM | POA: Diagnosis not present

## 2024-05-10 DIAGNOSIS — E785 Hyperlipidemia, unspecified: Secondary | ICD-10-CM | POA: Diagnosis not present

## 2024-05-10 DIAGNOSIS — M6259 Muscle wasting and atrophy, not elsewhere classified, multiple sites: Secondary | ICD-10-CM | POA: Diagnosis not present

## 2024-05-10 DIAGNOSIS — N1831 Chronic kidney disease, stage 3a: Secondary | ICD-10-CM | POA: Diagnosis not present

## 2024-05-10 DIAGNOSIS — Z9181 History of falling: Secondary | ICD-10-CM | POA: Diagnosis not present

## 2024-05-10 DIAGNOSIS — R488 Other symbolic dysfunctions: Secondary | ICD-10-CM | POA: Diagnosis not present

## 2024-05-10 DIAGNOSIS — S31811A Laceration without foreign body of right buttock, initial encounter: Secondary | ICD-10-CM | POA: Diagnosis not present

## 2024-05-10 DIAGNOSIS — R41841 Cognitive communication deficit: Secondary | ICD-10-CM | POA: Diagnosis not present

## 2024-05-11 DIAGNOSIS — R488 Other symbolic dysfunctions: Secondary | ICD-10-CM | POA: Diagnosis not present

## 2024-05-11 DIAGNOSIS — R41841 Cognitive communication deficit: Secondary | ICD-10-CM | POA: Diagnosis not present

## 2024-05-11 DIAGNOSIS — I639 Cerebral infarction, unspecified: Secondary | ICD-10-CM | POA: Diagnosis not present

## 2024-05-11 DIAGNOSIS — M6281 Muscle weakness (generalized): Secondary | ICD-10-CM | POA: Diagnosis not present

## 2024-05-11 DIAGNOSIS — E785 Hyperlipidemia, unspecified: Secondary | ICD-10-CM | POA: Diagnosis not present

## 2024-05-11 DIAGNOSIS — M6259 Muscle wasting and atrophy, not elsewhere classified, multiple sites: Secondary | ICD-10-CM | POA: Diagnosis not present

## 2024-05-11 DIAGNOSIS — Z9181 History of falling: Secondary | ICD-10-CM | POA: Diagnosis not present

## 2024-05-11 DIAGNOSIS — N1831 Chronic kidney disease, stage 3a: Secondary | ICD-10-CM | POA: Diagnosis not present

## 2024-05-12 DIAGNOSIS — R488 Other symbolic dysfunctions: Secondary | ICD-10-CM | POA: Diagnosis not present

## 2024-05-12 DIAGNOSIS — N1831 Chronic kidney disease, stage 3a: Secondary | ICD-10-CM | POA: Diagnosis not present

## 2024-05-12 DIAGNOSIS — Z9181 History of falling: Secondary | ICD-10-CM | POA: Diagnosis not present

## 2024-05-12 DIAGNOSIS — I639 Cerebral infarction, unspecified: Secondary | ICD-10-CM | POA: Diagnosis not present

## 2024-05-12 DIAGNOSIS — M6281 Muscle weakness (generalized): Secondary | ICD-10-CM | POA: Diagnosis not present

## 2024-05-12 DIAGNOSIS — E785 Hyperlipidemia, unspecified: Secondary | ICD-10-CM | POA: Diagnosis not present

## 2024-05-12 DIAGNOSIS — R41841 Cognitive communication deficit: Secondary | ICD-10-CM | POA: Diagnosis not present

## 2024-05-12 DIAGNOSIS — M6259 Muscle wasting and atrophy, not elsewhere classified, multiple sites: Secondary | ICD-10-CM | POA: Diagnosis not present

## 2024-05-15 DIAGNOSIS — M6281 Muscle weakness (generalized): Secondary | ICD-10-CM | POA: Diagnosis not present

## 2024-05-15 DIAGNOSIS — I639 Cerebral infarction, unspecified: Secondary | ICD-10-CM | POA: Diagnosis not present

## 2024-05-15 DIAGNOSIS — Z9181 History of falling: Secondary | ICD-10-CM | POA: Diagnosis not present

## 2024-05-15 DIAGNOSIS — N1831 Chronic kidney disease, stage 3a: Secondary | ICD-10-CM | POA: Diagnosis not present

## 2024-05-15 DIAGNOSIS — R488 Other symbolic dysfunctions: Secondary | ICD-10-CM | POA: Diagnosis not present

## 2024-05-15 DIAGNOSIS — R41841 Cognitive communication deficit: Secondary | ICD-10-CM | POA: Diagnosis not present

## 2024-05-15 DIAGNOSIS — M6259 Muscle wasting and atrophy, not elsewhere classified, multiple sites: Secondary | ICD-10-CM | POA: Diagnosis not present

## 2024-05-15 DIAGNOSIS — E785 Hyperlipidemia, unspecified: Secondary | ICD-10-CM | POA: Diagnosis not present

## 2024-05-16 DIAGNOSIS — R41841 Cognitive communication deficit: Secondary | ICD-10-CM | POA: Diagnosis not present

## 2024-05-16 DIAGNOSIS — I639 Cerebral infarction, unspecified: Secondary | ICD-10-CM | POA: Diagnosis not present

## 2024-05-16 DIAGNOSIS — N1831 Chronic kidney disease, stage 3a: Secondary | ICD-10-CM | POA: Diagnosis not present

## 2024-05-16 DIAGNOSIS — R488 Other symbolic dysfunctions: Secondary | ICD-10-CM | POA: Diagnosis not present

## 2024-05-16 DIAGNOSIS — M6259 Muscle wasting and atrophy, not elsewhere classified, multiple sites: Secondary | ICD-10-CM | POA: Diagnosis not present

## 2024-05-16 DIAGNOSIS — Z9181 History of falling: Secondary | ICD-10-CM | POA: Diagnosis not present

## 2024-05-16 DIAGNOSIS — E785 Hyperlipidemia, unspecified: Secondary | ICD-10-CM | POA: Diagnosis not present

## 2024-05-16 DIAGNOSIS — M6281 Muscle weakness (generalized): Secondary | ICD-10-CM | POA: Diagnosis not present

## 2024-05-16 DIAGNOSIS — L988 Other specified disorders of the skin and subcutaneous tissue: Secondary | ICD-10-CM | POA: Diagnosis not present

## 2024-05-17 DIAGNOSIS — N1831 Chronic kidney disease, stage 3a: Secondary | ICD-10-CM | POA: Diagnosis not present

## 2024-05-17 DIAGNOSIS — Z9181 History of falling: Secondary | ICD-10-CM | POA: Diagnosis not present

## 2024-05-17 DIAGNOSIS — I639 Cerebral infarction, unspecified: Secondary | ICD-10-CM | POA: Diagnosis not present

## 2024-05-17 DIAGNOSIS — M6259 Muscle wasting and atrophy, not elsewhere classified, multiple sites: Secondary | ICD-10-CM | POA: Diagnosis not present

## 2024-05-17 DIAGNOSIS — R488 Other symbolic dysfunctions: Secondary | ICD-10-CM | POA: Diagnosis not present

## 2024-05-17 DIAGNOSIS — M6281 Muscle weakness (generalized): Secondary | ICD-10-CM | POA: Diagnosis not present

## 2024-05-17 DIAGNOSIS — R41841 Cognitive communication deficit: Secondary | ICD-10-CM | POA: Diagnosis not present

## 2024-05-17 DIAGNOSIS — E785 Hyperlipidemia, unspecified: Secondary | ICD-10-CM | POA: Diagnosis not present

## 2024-05-18 DIAGNOSIS — E785 Hyperlipidemia, unspecified: Secondary | ICD-10-CM | POA: Diagnosis not present

## 2024-05-18 DIAGNOSIS — R488 Other symbolic dysfunctions: Secondary | ICD-10-CM | POA: Diagnosis not present

## 2024-05-18 DIAGNOSIS — N1831 Chronic kidney disease, stage 3a: Secondary | ICD-10-CM | POA: Diagnosis not present

## 2024-05-18 DIAGNOSIS — R41841 Cognitive communication deficit: Secondary | ICD-10-CM | POA: Diagnosis not present

## 2024-05-18 DIAGNOSIS — I639 Cerebral infarction, unspecified: Secondary | ICD-10-CM | POA: Diagnosis not present

## 2024-05-18 DIAGNOSIS — Z9181 History of falling: Secondary | ICD-10-CM | POA: Diagnosis not present

## 2024-05-18 DIAGNOSIS — M6281 Muscle weakness (generalized): Secondary | ICD-10-CM | POA: Diagnosis not present

## 2024-05-18 DIAGNOSIS — M6259 Muscle wasting and atrophy, not elsewhere classified, multiple sites: Secondary | ICD-10-CM | POA: Diagnosis not present

## 2024-05-19 DIAGNOSIS — Z9181 History of falling: Secondary | ICD-10-CM | POA: Diagnosis not present

## 2024-05-19 DIAGNOSIS — M6281 Muscle weakness (generalized): Secondary | ICD-10-CM | POA: Diagnosis not present

## 2024-05-19 DIAGNOSIS — R488 Other symbolic dysfunctions: Secondary | ICD-10-CM | POA: Diagnosis not present

## 2024-05-19 DIAGNOSIS — M6259 Muscle wasting and atrophy, not elsewhere classified, multiple sites: Secondary | ICD-10-CM | POA: Diagnosis not present

## 2024-05-19 DIAGNOSIS — N1831 Chronic kidney disease, stage 3a: Secondary | ICD-10-CM | POA: Diagnosis not present

## 2024-05-19 DIAGNOSIS — E785 Hyperlipidemia, unspecified: Secondary | ICD-10-CM | POA: Diagnosis not present

## 2024-05-19 DIAGNOSIS — I639 Cerebral infarction, unspecified: Secondary | ICD-10-CM | POA: Diagnosis not present

## 2024-05-19 DIAGNOSIS — R41841 Cognitive communication deficit: Secondary | ICD-10-CM | POA: Diagnosis not present

## 2024-05-22 DIAGNOSIS — M6259 Muscle wasting and atrophy, not elsewhere classified, multiple sites: Secondary | ICD-10-CM | POA: Diagnosis not present

## 2024-05-22 DIAGNOSIS — Z9181 History of falling: Secondary | ICD-10-CM | POA: Diagnosis not present

## 2024-05-22 DIAGNOSIS — R41841 Cognitive communication deficit: Secondary | ICD-10-CM | POA: Diagnosis not present

## 2024-05-22 DIAGNOSIS — I639 Cerebral infarction, unspecified: Secondary | ICD-10-CM | POA: Diagnosis not present

## 2024-05-22 DIAGNOSIS — N1831 Chronic kidney disease, stage 3a: Secondary | ICD-10-CM | POA: Diagnosis not present

## 2024-05-22 DIAGNOSIS — M6281 Muscle weakness (generalized): Secondary | ICD-10-CM | POA: Diagnosis not present

## 2024-05-22 DIAGNOSIS — R488 Other symbolic dysfunctions: Secondary | ICD-10-CM | POA: Diagnosis not present

## 2024-05-22 DIAGNOSIS — E785 Hyperlipidemia, unspecified: Secondary | ICD-10-CM | POA: Diagnosis not present

## 2024-05-23 DIAGNOSIS — I639 Cerebral infarction, unspecified: Secondary | ICD-10-CM | POA: Diagnosis not present

## 2024-05-23 DIAGNOSIS — R488 Other symbolic dysfunctions: Secondary | ICD-10-CM | POA: Diagnosis not present

## 2024-05-23 DIAGNOSIS — E785 Hyperlipidemia, unspecified: Secondary | ICD-10-CM | POA: Diagnosis not present

## 2024-05-23 DIAGNOSIS — Z9181 History of falling: Secondary | ICD-10-CM | POA: Diagnosis not present

## 2024-05-23 DIAGNOSIS — R41841 Cognitive communication deficit: Secondary | ICD-10-CM | POA: Diagnosis not present

## 2024-05-23 DIAGNOSIS — N1831 Chronic kidney disease, stage 3a: Secondary | ICD-10-CM | POA: Diagnosis not present

## 2024-05-23 DIAGNOSIS — M6259 Muscle wasting and atrophy, not elsewhere classified, multiple sites: Secondary | ICD-10-CM | POA: Diagnosis not present

## 2024-05-23 DIAGNOSIS — M6281 Muscle weakness (generalized): Secondary | ICD-10-CM | POA: Diagnosis not present

## 2024-05-24 DIAGNOSIS — N1831 Chronic kidney disease, stage 3a: Secondary | ICD-10-CM | POA: Diagnosis not present

## 2024-05-24 DIAGNOSIS — R488 Other symbolic dysfunctions: Secondary | ICD-10-CM | POA: Diagnosis not present

## 2024-05-24 DIAGNOSIS — I639 Cerebral infarction, unspecified: Secondary | ICD-10-CM | POA: Diagnosis not present

## 2024-05-24 DIAGNOSIS — Z9181 History of falling: Secondary | ICD-10-CM | POA: Diagnosis not present

## 2024-05-24 DIAGNOSIS — M6281 Muscle weakness (generalized): Secondary | ICD-10-CM | POA: Diagnosis not present

## 2024-05-24 DIAGNOSIS — E785 Hyperlipidemia, unspecified: Secondary | ICD-10-CM | POA: Diagnosis not present

## 2024-05-24 DIAGNOSIS — M6259 Muscle wasting and atrophy, not elsewhere classified, multiple sites: Secondary | ICD-10-CM | POA: Diagnosis not present

## 2024-05-24 DIAGNOSIS — R41841 Cognitive communication deficit: Secondary | ICD-10-CM | POA: Diagnosis not present

## 2024-05-25 DIAGNOSIS — Z9181 History of falling: Secondary | ICD-10-CM | POA: Diagnosis not present

## 2024-05-25 DIAGNOSIS — E785 Hyperlipidemia, unspecified: Secondary | ICD-10-CM | POA: Diagnosis not present

## 2024-05-25 DIAGNOSIS — I639 Cerebral infarction, unspecified: Secondary | ICD-10-CM | POA: Diagnosis not present

## 2024-05-25 DIAGNOSIS — M6259 Muscle wasting and atrophy, not elsewhere classified, multiple sites: Secondary | ICD-10-CM | POA: Diagnosis not present

## 2024-05-25 DIAGNOSIS — M6281 Muscle weakness (generalized): Secondary | ICD-10-CM | POA: Diagnosis not present

## 2024-05-25 DIAGNOSIS — R488 Other symbolic dysfunctions: Secondary | ICD-10-CM | POA: Diagnosis not present

## 2024-05-25 DIAGNOSIS — N1831 Chronic kidney disease, stage 3a: Secondary | ICD-10-CM | POA: Diagnosis not present

## 2024-05-25 DIAGNOSIS — R41841 Cognitive communication deficit: Secondary | ICD-10-CM | POA: Diagnosis not present

## 2024-05-26 DIAGNOSIS — R41841 Cognitive communication deficit: Secondary | ICD-10-CM | POA: Diagnosis not present

## 2024-05-26 DIAGNOSIS — N1831 Chronic kidney disease, stage 3a: Secondary | ICD-10-CM | POA: Diagnosis not present

## 2024-05-26 DIAGNOSIS — M6281 Muscle weakness (generalized): Secondary | ICD-10-CM | POA: Diagnosis not present

## 2024-05-26 DIAGNOSIS — I639 Cerebral infarction, unspecified: Secondary | ICD-10-CM | POA: Diagnosis not present

## 2024-05-26 DIAGNOSIS — Z9181 History of falling: Secondary | ICD-10-CM | POA: Diagnosis not present

## 2024-05-26 DIAGNOSIS — M6259 Muscle wasting and atrophy, not elsewhere classified, multiple sites: Secondary | ICD-10-CM | POA: Diagnosis not present

## 2024-05-26 DIAGNOSIS — E785 Hyperlipidemia, unspecified: Secondary | ICD-10-CM | POA: Diagnosis not present

## 2024-05-26 DIAGNOSIS — R488 Other symbolic dysfunctions: Secondary | ICD-10-CM | POA: Diagnosis not present

## 2024-05-29 DIAGNOSIS — I639 Cerebral infarction, unspecified: Secondary | ICD-10-CM | POA: Diagnosis not present

## 2024-05-29 DIAGNOSIS — Z9181 History of falling: Secondary | ICD-10-CM | POA: Diagnosis not present

## 2024-05-29 DIAGNOSIS — R488 Other symbolic dysfunctions: Secondary | ICD-10-CM | POA: Diagnosis not present

## 2024-05-29 DIAGNOSIS — E785 Hyperlipidemia, unspecified: Secondary | ICD-10-CM | POA: Diagnosis not present

## 2024-05-29 DIAGNOSIS — M6259 Muscle wasting and atrophy, not elsewhere classified, multiple sites: Secondary | ICD-10-CM | POA: Diagnosis not present

## 2024-05-29 DIAGNOSIS — M6281 Muscle weakness (generalized): Secondary | ICD-10-CM | POA: Diagnosis not present

## 2024-05-29 DIAGNOSIS — R41841 Cognitive communication deficit: Secondary | ICD-10-CM | POA: Diagnosis not present

## 2024-05-29 DIAGNOSIS — N1831 Chronic kidney disease, stage 3a: Secondary | ICD-10-CM | POA: Diagnosis not present

## 2024-05-30 DIAGNOSIS — N1831 Chronic kidney disease, stage 3a: Secondary | ICD-10-CM | POA: Diagnosis not present

## 2024-05-30 DIAGNOSIS — I639 Cerebral infarction, unspecified: Secondary | ICD-10-CM | POA: Diagnosis not present

## 2024-05-30 DIAGNOSIS — E785 Hyperlipidemia, unspecified: Secondary | ICD-10-CM | POA: Diagnosis not present

## 2024-05-30 DIAGNOSIS — M6281 Muscle weakness (generalized): Secondary | ICD-10-CM | POA: Diagnosis not present

## 2024-06-01 DIAGNOSIS — I639 Cerebral infarction, unspecified: Secondary | ICD-10-CM | POA: Diagnosis not present

## 2024-06-01 DIAGNOSIS — M6281 Muscle weakness (generalized): Secondary | ICD-10-CM | POA: Diagnosis not present

## 2024-06-01 DIAGNOSIS — N1831 Chronic kidney disease, stage 3a: Secondary | ICD-10-CM | POA: Diagnosis not present

## 2024-06-01 DIAGNOSIS — E785 Hyperlipidemia, unspecified: Secondary | ICD-10-CM | POA: Diagnosis not present

## 2024-06-03 DIAGNOSIS — N1831 Chronic kidney disease, stage 3a: Secondary | ICD-10-CM | POA: Diagnosis not present

## 2024-06-03 DIAGNOSIS — E785 Hyperlipidemia, unspecified: Secondary | ICD-10-CM | POA: Diagnosis not present

## 2024-06-03 DIAGNOSIS — I639 Cerebral infarction, unspecified: Secondary | ICD-10-CM | POA: Diagnosis not present

## 2024-06-03 DIAGNOSIS — M6281 Muscle weakness (generalized): Secondary | ICD-10-CM | POA: Diagnosis not present

## 2024-06-05 DIAGNOSIS — M6281 Muscle weakness (generalized): Secondary | ICD-10-CM | POA: Diagnosis not present

## 2024-06-05 DIAGNOSIS — N1831 Chronic kidney disease, stage 3a: Secondary | ICD-10-CM | POA: Diagnosis not present

## 2024-06-05 DIAGNOSIS — N183 Chronic kidney disease, stage 3 unspecified: Secondary | ICD-10-CM | POA: Diagnosis not present

## 2024-06-05 DIAGNOSIS — I6930 Unspecified sequelae of cerebral infarction: Secondary | ICD-10-CM | POA: Diagnosis not present

## 2024-06-05 DIAGNOSIS — I10 Essential (primary) hypertension: Secondary | ICD-10-CM | POA: Diagnosis not present

## 2024-06-05 DIAGNOSIS — I69319 Unspecified symptoms and signs involving cognitive functions following cerebral infarction: Secondary | ICD-10-CM | POA: Diagnosis not present

## 2024-06-05 DIAGNOSIS — E785 Hyperlipidemia, unspecified: Secondary | ICD-10-CM | POA: Diagnosis not present

## 2024-06-05 DIAGNOSIS — I639 Cerebral infarction, unspecified: Secondary | ICD-10-CM | POA: Diagnosis not present

## 2024-06-06 DIAGNOSIS — E785 Hyperlipidemia, unspecified: Secondary | ICD-10-CM | POA: Diagnosis not present

## 2024-06-06 DIAGNOSIS — M6281 Muscle weakness (generalized): Secondary | ICD-10-CM | POA: Diagnosis not present

## 2024-06-06 DIAGNOSIS — I639 Cerebral infarction, unspecified: Secondary | ICD-10-CM | POA: Diagnosis not present

## 2024-06-06 DIAGNOSIS — N1831 Chronic kidney disease, stage 3a: Secondary | ICD-10-CM | POA: Diagnosis not present

## 2024-06-07 DIAGNOSIS — E785 Hyperlipidemia, unspecified: Secondary | ICD-10-CM | POA: Diagnosis not present

## 2024-06-07 DIAGNOSIS — I639 Cerebral infarction, unspecified: Secondary | ICD-10-CM | POA: Diagnosis not present

## 2024-06-07 DIAGNOSIS — N1831 Chronic kidney disease, stage 3a: Secondary | ICD-10-CM | POA: Diagnosis not present

## 2024-06-07 DIAGNOSIS — M6281 Muscle weakness (generalized): Secondary | ICD-10-CM | POA: Diagnosis not present

## 2024-06-08 DIAGNOSIS — E785 Hyperlipidemia, unspecified: Secondary | ICD-10-CM | POA: Diagnosis not present

## 2024-06-08 DIAGNOSIS — I639 Cerebral infarction, unspecified: Secondary | ICD-10-CM | POA: Diagnosis not present

## 2024-06-08 DIAGNOSIS — M6281 Muscle weakness (generalized): Secondary | ICD-10-CM | POA: Diagnosis not present

## 2024-06-08 DIAGNOSIS — N1831 Chronic kidney disease, stage 3a: Secondary | ICD-10-CM | POA: Diagnosis not present

## 2024-06-09 DIAGNOSIS — I639 Cerebral infarction, unspecified: Secondary | ICD-10-CM | POA: Diagnosis not present

## 2024-06-09 DIAGNOSIS — E785 Hyperlipidemia, unspecified: Secondary | ICD-10-CM | POA: Diagnosis not present

## 2024-06-09 DIAGNOSIS — M6281 Muscle weakness (generalized): Secondary | ICD-10-CM | POA: Diagnosis not present

## 2024-06-09 DIAGNOSIS — N1831 Chronic kidney disease, stage 3a: Secondary | ICD-10-CM | POA: Diagnosis not present

## 2024-06-12 DIAGNOSIS — M6281 Muscle weakness (generalized): Secondary | ICD-10-CM | POA: Diagnosis not present

## 2024-06-12 DIAGNOSIS — E785 Hyperlipidemia, unspecified: Secondary | ICD-10-CM | POA: Diagnosis not present

## 2024-06-12 DIAGNOSIS — N1831 Chronic kidney disease, stage 3a: Secondary | ICD-10-CM | POA: Diagnosis not present

## 2024-06-12 DIAGNOSIS — I639 Cerebral infarction, unspecified: Secondary | ICD-10-CM | POA: Diagnosis not present

## 2024-06-13 DIAGNOSIS — M6281 Muscle weakness (generalized): Secondary | ICD-10-CM | POA: Diagnosis not present

## 2024-06-13 DIAGNOSIS — F01B18 Vascular dementia, moderate, with other behavioral disturbance: Secondary | ICD-10-CM | POA: Diagnosis not present

## 2024-06-13 DIAGNOSIS — E785 Hyperlipidemia, unspecified: Secondary | ICD-10-CM | POA: Diagnosis not present

## 2024-06-13 DIAGNOSIS — N1831 Chronic kidney disease, stage 3a: Secondary | ICD-10-CM | POA: Diagnosis not present

## 2024-06-13 DIAGNOSIS — F321 Major depressive disorder, single episode, moderate: Secondary | ICD-10-CM | POA: Diagnosis not present

## 2024-06-13 DIAGNOSIS — I10 Essential (primary) hypertension: Secondary | ICD-10-CM | POA: Diagnosis not present

## 2024-06-13 DIAGNOSIS — I639 Cerebral infarction, unspecified: Secondary | ICD-10-CM | POA: Diagnosis not present

## 2024-06-14 DIAGNOSIS — E785 Hyperlipidemia, unspecified: Secondary | ICD-10-CM | POA: Diagnosis not present

## 2024-06-14 DIAGNOSIS — I639 Cerebral infarction, unspecified: Secondary | ICD-10-CM | POA: Diagnosis not present

## 2024-06-14 DIAGNOSIS — N1831 Chronic kidney disease, stage 3a: Secondary | ICD-10-CM | POA: Diagnosis not present

## 2024-06-14 DIAGNOSIS — M6281 Muscle weakness (generalized): Secondary | ICD-10-CM | POA: Diagnosis not present

## 2024-06-15 DIAGNOSIS — I639 Cerebral infarction, unspecified: Secondary | ICD-10-CM | POA: Diagnosis not present

## 2024-06-15 DIAGNOSIS — E785 Hyperlipidemia, unspecified: Secondary | ICD-10-CM | POA: Diagnosis not present

## 2024-06-15 DIAGNOSIS — M6281 Muscle weakness (generalized): Secondary | ICD-10-CM | POA: Diagnosis not present

## 2024-06-15 DIAGNOSIS — N1831 Chronic kidney disease, stage 3a: Secondary | ICD-10-CM | POA: Diagnosis not present

## 2024-06-16 DIAGNOSIS — N1831 Chronic kidney disease, stage 3a: Secondary | ICD-10-CM | POA: Diagnosis not present

## 2024-06-16 DIAGNOSIS — M6281 Muscle weakness (generalized): Secondary | ICD-10-CM | POA: Diagnosis not present

## 2024-06-16 DIAGNOSIS — E785 Hyperlipidemia, unspecified: Secondary | ICD-10-CM | POA: Diagnosis not present

## 2024-06-16 DIAGNOSIS — I639 Cerebral infarction, unspecified: Secondary | ICD-10-CM | POA: Diagnosis not present

## 2024-06-19 DIAGNOSIS — N1831 Chronic kidney disease, stage 3a: Secondary | ICD-10-CM | POA: Diagnosis not present

## 2024-06-19 DIAGNOSIS — E785 Hyperlipidemia, unspecified: Secondary | ICD-10-CM | POA: Diagnosis not present

## 2024-06-19 DIAGNOSIS — M6281 Muscle weakness (generalized): Secondary | ICD-10-CM | POA: Diagnosis not present

## 2024-06-19 DIAGNOSIS — I639 Cerebral infarction, unspecified: Secondary | ICD-10-CM | POA: Diagnosis not present

## 2024-06-20 DIAGNOSIS — M6281 Muscle weakness (generalized): Secondary | ICD-10-CM | POA: Diagnosis not present

## 2024-06-20 DIAGNOSIS — E785 Hyperlipidemia, unspecified: Secondary | ICD-10-CM | POA: Diagnosis not present

## 2024-06-20 DIAGNOSIS — N1831 Chronic kidney disease, stage 3a: Secondary | ICD-10-CM | POA: Diagnosis not present

## 2024-06-20 DIAGNOSIS — I639 Cerebral infarction, unspecified: Secondary | ICD-10-CM | POA: Diagnosis not present

## 2024-06-21 DIAGNOSIS — N1831 Chronic kidney disease, stage 3a: Secondary | ICD-10-CM | POA: Diagnosis not present

## 2024-06-21 DIAGNOSIS — M6281 Muscle weakness (generalized): Secondary | ICD-10-CM | POA: Diagnosis not present

## 2024-06-21 DIAGNOSIS — I639 Cerebral infarction, unspecified: Secondary | ICD-10-CM | POA: Diagnosis not present

## 2024-06-21 DIAGNOSIS — E785 Hyperlipidemia, unspecified: Secondary | ICD-10-CM | POA: Diagnosis not present

## 2024-06-22 DIAGNOSIS — I639 Cerebral infarction, unspecified: Secondary | ICD-10-CM | POA: Diagnosis not present

## 2024-06-22 DIAGNOSIS — E785 Hyperlipidemia, unspecified: Secondary | ICD-10-CM | POA: Diagnosis not present

## 2024-06-22 DIAGNOSIS — N1831 Chronic kidney disease, stage 3a: Secondary | ICD-10-CM | POA: Diagnosis not present

## 2024-06-22 DIAGNOSIS — M6281 Muscle weakness (generalized): Secondary | ICD-10-CM | POA: Diagnosis not present

## 2024-06-23 DIAGNOSIS — N1831 Chronic kidney disease, stage 3a: Secondary | ICD-10-CM | POA: Diagnosis not present

## 2024-06-23 DIAGNOSIS — M6281 Muscle weakness (generalized): Secondary | ICD-10-CM | POA: Diagnosis not present

## 2024-06-23 DIAGNOSIS — I639 Cerebral infarction, unspecified: Secondary | ICD-10-CM | POA: Diagnosis not present

## 2024-06-23 DIAGNOSIS — E785 Hyperlipidemia, unspecified: Secondary | ICD-10-CM | POA: Diagnosis not present

## 2024-06-26 DIAGNOSIS — I639 Cerebral infarction, unspecified: Secondary | ICD-10-CM | POA: Diagnosis not present

## 2024-06-26 DIAGNOSIS — M6281 Muscle weakness (generalized): Secondary | ICD-10-CM | POA: Diagnosis not present

## 2024-06-26 DIAGNOSIS — N1831 Chronic kidney disease, stage 3a: Secondary | ICD-10-CM | POA: Diagnosis not present

## 2024-06-26 DIAGNOSIS — E785 Hyperlipidemia, unspecified: Secondary | ICD-10-CM | POA: Diagnosis not present

## 2024-06-29 DIAGNOSIS — E119 Type 2 diabetes mellitus without complications: Secondary | ICD-10-CM | POA: Diagnosis not present

## 2024-06-29 DIAGNOSIS — I10 Essential (primary) hypertension: Secondary | ICD-10-CM | POA: Diagnosis not present

## 2024-07-03 DIAGNOSIS — N183 Chronic kidney disease, stage 3 unspecified: Secondary | ICD-10-CM | POA: Diagnosis not present

## 2024-07-03 DIAGNOSIS — I69319 Unspecified symptoms and signs involving cognitive functions following cerebral infarction: Secondary | ICD-10-CM | POA: Diagnosis not present

## 2024-07-03 DIAGNOSIS — I6930 Unspecified sequelae of cerebral infarction: Secondary | ICD-10-CM | POA: Diagnosis not present

## 2024-07-04 DIAGNOSIS — E1151 Type 2 diabetes mellitus with diabetic peripheral angiopathy without gangrene: Secondary | ICD-10-CM | POA: Diagnosis not present

## 2024-07-04 DIAGNOSIS — L602 Onychogryphosis: Secondary | ICD-10-CM | POA: Diagnosis not present

## 2024-07-04 DIAGNOSIS — L603 Nail dystrophy: Secondary | ICD-10-CM | POA: Diagnosis not present

## 2024-07-04 DIAGNOSIS — Z794 Long term (current) use of insulin: Secondary | ICD-10-CM | POA: Diagnosis not present

## 2024-07-06 DIAGNOSIS — M25552 Pain in left hip: Secondary | ICD-10-CM | POA: Diagnosis not present

## 2024-07-06 DIAGNOSIS — M25562 Pain in left knee: Secondary | ICD-10-CM | POA: Diagnosis not present

## 2024-07-19 DIAGNOSIS — R278 Other lack of coordination: Secondary | ICD-10-CM | POA: Diagnosis not present

## 2024-07-19 DIAGNOSIS — F01B4 Vascular dementia, moderate, with anxiety: Secondary | ICD-10-CM | POA: Diagnosis not present

## 2024-07-19 DIAGNOSIS — M6281 Muscle weakness (generalized): Secondary | ICD-10-CM | POA: Diagnosis not present

## 2024-07-20 DIAGNOSIS — M6281 Muscle weakness (generalized): Secondary | ICD-10-CM | POA: Diagnosis not present

## 2024-07-20 DIAGNOSIS — F01B4 Vascular dementia, moderate, with anxiety: Secondary | ICD-10-CM | POA: Diagnosis not present

## 2024-07-20 DIAGNOSIS — R278 Other lack of coordination: Secondary | ICD-10-CM | POA: Diagnosis not present

## 2024-07-21 DIAGNOSIS — F01B4 Vascular dementia, moderate, with anxiety: Secondary | ICD-10-CM | POA: Diagnosis not present

## 2024-07-21 DIAGNOSIS — M6281 Muscle weakness (generalized): Secondary | ICD-10-CM | POA: Diagnosis not present

## 2024-07-21 DIAGNOSIS — R278 Other lack of coordination: Secondary | ICD-10-CM | POA: Diagnosis not present

## 2024-07-24 DIAGNOSIS — M6281 Muscle weakness (generalized): Secondary | ICD-10-CM | POA: Diagnosis not present

## 2024-07-24 DIAGNOSIS — R278 Other lack of coordination: Secondary | ICD-10-CM | POA: Diagnosis not present

## 2024-07-24 DIAGNOSIS — F01B4 Vascular dementia, moderate, with anxiety: Secondary | ICD-10-CM | POA: Diagnosis not present

## 2024-07-25 DIAGNOSIS — R278 Other lack of coordination: Secondary | ICD-10-CM | POA: Diagnosis not present

## 2024-07-25 DIAGNOSIS — M6281 Muscle weakness (generalized): Secondary | ICD-10-CM | POA: Diagnosis not present

## 2024-07-25 DIAGNOSIS — F321 Major depressive disorder, single episode, moderate: Secondary | ICD-10-CM | POA: Diagnosis not present

## 2024-07-25 DIAGNOSIS — I10 Essential (primary) hypertension: Secondary | ICD-10-CM | POA: Diagnosis not present

## 2024-07-25 DIAGNOSIS — F01B18 Vascular dementia, moderate, with other behavioral disturbance: Secondary | ICD-10-CM | POA: Diagnosis not present

## 2024-07-25 DIAGNOSIS — F01B4 Vascular dementia, moderate, with anxiety: Secondary | ICD-10-CM | POA: Diagnosis not present

## 2024-07-26 DIAGNOSIS — F01B4 Vascular dementia, moderate, with anxiety: Secondary | ICD-10-CM | POA: Diagnosis not present

## 2024-07-26 DIAGNOSIS — R278 Other lack of coordination: Secondary | ICD-10-CM | POA: Diagnosis not present

## 2024-07-26 DIAGNOSIS — M6281 Muscle weakness (generalized): Secondary | ICD-10-CM | POA: Diagnosis not present

## 2024-07-27 DIAGNOSIS — M6281 Muscle weakness (generalized): Secondary | ICD-10-CM | POA: Diagnosis not present

## 2024-07-27 DIAGNOSIS — F01B4 Vascular dementia, moderate, with anxiety: Secondary | ICD-10-CM | POA: Diagnosis not present

## 2024-07-27 DIAGNOSIS — R278 Other lack of coordination: Secondary | ICD-10-CM | POA: Diagnosis not present

## 2024-07-28 DIAGNOSIS — R278 Other lack of coordination: Secondary | ICD-10-CM | POA: Diagnosis not present

## 2024-07-28 DIAGNOSIS — I10 Essential (primary) hypertension: Secondary | ICD-10-CM | POA: Diagnosis not present

## 2024-07-28 DIAGNOSIS — M6281 Muscle weakness (generalized): Secondary | ICD-10-CM | POA: Diagnosis not present

## 2024-07-28 DIAGNOSIS — F01B4 Vascular dementia, moderate, with anxiety: Secondary | ICD-10-CM | POA: Diagnosis not present

## 2024-07-28 DIAGNOSIS — E785 Hyperlipidemia, unspecified: Secondary | ICD-10-CM | POA: Diagnosis not present

## 2024-07-31 DIAGNOSIS — R278 Other lack of coordination: Secondary | ICD-10-CM | POA: Diagnosis not present

## 2024-07-31 DIAGNOSIS — F01B4 Vascular dementia, moderate, with anxiety: Secondary | ICD-10-CM | POA: Diagnosis not present

## 2024-07-31 DIAGNOSIS — M6281 Muscle weakness (generalized): Secondary | ICD-10-CM | POA: Diagnosis not present

## 2024-08-01 DIAGNOSIS — I6930 Unspecified sequelae of cerebral infarction: Secondary | ICD-10-CM | POA: Diagnosis not present

## 2024-08-01 DIAGNOSIS — R278 Other lack of coordination: Secondary | ICD-10-CM | POA: Diagnosis not present

## 2024-08-01 DIAGNOSIS — I10 Essential (primary) hypertension: Secondary | ICD-10-CM | POA: Diagnosis not present

## 2024-08-01 DIAGNOSIS — I130 Hypertensive heart and chronic kidney disease with heart failure and stage 1 through stage 4 chronic kidney disease, or unspecified chronic kidney disease: Secondary | ICD-10-CM | POA: Diagnosis not present

## 2024-08-01 DIAGNOSIS — I69319 Unspecified symptoms and signs involving cognitive functions following cerebral infarction: Secondary | ICD-10-CM | POA: Diagnosis not present

## 2024-08-01 DIAGNOSIS — M6281 Muscle weakness (generalized): Secondary | ICD-10-CM | POA: Diagnosis not present

## 2024-08-01 DIAGNOSIS — F01B4 Vascular dementia, moderate, with anxiety: Secondary | ICD-10-CM | POA: Diagnosis not present

## 2024-08-02 DIAGNOSIS — M6281 Muscle weakness (generalized): Secondary | ICD-10-CM | POA: Diagnosis not present

## 2024-08-02 DIAGNOSIS — F01B4 Vascular dementia, moderate, with anxiety: Secondary | ICD-10-CM | POA: Diagnosis not present

## 2024-08-03 DIAGNOSIS — F01B4 Vascular dementia, moderate, with anxiety: Secondary | ICD-10-CM | POA: Diagnosis not present

## 2024-08-03 DIAGNOSIS — M6281 Muscle weakness (generalized): Secondary | ICD-10-CM | POA: Diagnosis not present

## 2024-08-04 DIAGNOSIS — M6281 Muscle weakness (generalized): Secondary | ICD-10-CM | POA: Diagnosis not present

## 2024-08-04 DIAGNOSIS — F01B4 Vascular dementia, moderate, with anxiety: Secondary | ICD-10-CM | POA: Diagnosis not present

## 2024-08-08 DIAGNOSIS — M6281 Muscle weakness (generalized): Secondary | ICD-10-CM | POA: Diagnosis not present

## 2024-08-08 DIAGNOSIS — F01B4 Vascular dementia, moderate, with anxiety: Secondary | ICD-10-CM | POA: Diagnosis not present

## 2024-08-09 DIAGNOSIS — F01B4 Vascular dementia, moderate, with anxiety: Secondary | ICD-10-CM | POA: Diagnosis not present

## 2024-08-09 DIAGNOSIS — M6281 Muscle weakness (generalized): Secondary | ICD-10-CM | POA: Diagnosis not present

## 2024-08-10 DIAGNOSIS — M6281 Muscle weakness (generalized): Secondary | ICD-10-CM | POA: Diagnosis not present

## 2024-08-10 DIAGNOSIS — R278 Other lack of coordination: Secondary | ICD-10-CM | POA: Diagnosis not present

## 2024-08-10 DIAGNOSIS — F01B4 Vascular dementia, moderate, with anxiety: Secondary | ICD-10-CM | POA: Diagnosis not present

## 2024-08-11 DIAGNOSIS — F01B4 Vascular dementia, moderate, with anxiety: Secondary | ICD-10-CM | POA: Diagnosis not present

## 2024-08-11 DIAGNOSIS — M6281 Muscle weakness (generalized): Secondary | ICD-10-CM | POA: Diagnosis not present

## 2024-08-14 DIAGNOSIS — F01B4 Vascular dementia, moderate, with anxiety: Secondary | ICD-10-CM | POA: Diagnosis not present

## 2024-08-17 DIAGNOSIS — M6281 Muscle weakness (generalized): Secondary | ICD-10-CM | POA: Diagnosis not present

## 2024-08-17 DIAGNOSIS — F01B4 Vascular dementia, moderate, with anxiety: Secondary | ICD-10-CM | POA: Diagnosis not present

## 2024-08-17 DIAGNOSIS — Z7185 Encounter for immunization safety counseling: Secondary | ICD-10-CM | POA: Diagnosis not present

## 2024-09-01 DIAGNOSIS — F01B18 Vascular dementia, moderate, with other behavioral disturbance: Secondary | ICD-10-CM | POA: Diagnosis not present

## 2024-09-01 DIAGNOSIS — F321 Major depressive disorder, single episode, moderate: Secondary | ICD-10-CM | POA: Diagnosis not present

## 2024-09-01 DIAGNOSIS — I10 Essential (primary) hypertension: Secondary | ICD-10-CM | POA: Diagnosis not present

## 2024-09-14 DIAGNOSIS — M6281 Muscle weakness (generalized): Secondary | ICD-10-CM | POA: Diagnosis not present

## 2024-09-15 DIAGNOSIS — L603 Nail dystrophy: Secondary | ICD-10-CM | POA: Diagnosis not present

## 2024-09-15 DIAGNOSIS — Z794 Long term (current) use of insulin: Secondary | ICD-10-CM | POA: Diagnosis not present

## 2024-09-15 DIAGNOSIS — L602 Onychogryphosis: Secondary | ICD-10-CM | POA: Diagnosis not present

## 2024-09-15 DIAGNOSIS — E1151 Type 2 diabetes mellitus with diabetic peripheral angiopathy without gangrene: Secondary | ICD-10-CM | POA: Diagnosis not present

## 2024-09-19 DIAGNOSIS — I350 Nonrheumatic aortic (valve) stenosis: Secondary | ICD-10-CM | POA: Diagnosis not present

## 2024-09-19 DIAGNOSIS — I69319 Unspecified symptoms and signs involving cognitive functions following cerebral infarction: Secondary | ICD-10-CM | POA: Diagnosis not present

## 2024-09-19 DIAGNOSIS — N1832 Chronic kidney disease, stage 3b: Secondary | ICD-10-CM | POA: Diagnosis not present

## 2024-09-19 DIAGNOSIS — E1151 Type 2 diabetes mellitus with diabetic peripheral angiopathy without gangrene: Secondary | ICD-10-CM | POA: Diagnosis not present

## 2024-09-22 DIAGNOSIS — R051 Acute cough: Secondary | ICD-10-CM | POA: Diagnosis not present

## 2024-09-22 DIAGNOSIS — I10 Essential (primary) hypertension: Secondary | ICD-10-CM | POA: Diagnosis not present

## 2024-09-22 DIAGNOSIS — I739 Peripheral vascular disease, unspecified: Secondary | ICD-10-CM | POA: Diagnosis not present

## 2024-09-22 DIAGNOSIS — F01B18 Vascular dementia, moderate, with other behavioral disturbance: Secondary | ICD-10-CM | POA: Diagnosis not present

## 2024-09-22 DIAGNOSIS — R0989 Other specified symptoms and signs involving the circulatory and respiratory systems: Secondary | ICD-10-CM | POA: Diagnosis not present

## 2024-09-22 DIAGNOSIS — F321 Major depressive disorder, single episode, moderate: Secondary | ICD-10-CM | POA: Diagnosis not present

## 2024-09-22 DIAGNOSIS — R059 Cough, unspecified: Secondary | ICD-10-CM | POA: Diagnosis not present

## 2024-09-26 DIAGNOSIS — L988 Other specified disorders of the skin and subcutaneous tissue: Secondary | ICD-10-CM | POA: Diagnosis not present

## 2024-09-29 DIAGNOSIS — R051 Acute cough: Secondary | ICD-10-CM | POA: Diagnosis not present

## 2024-10-02 DIAGNOSIS — R296 Repeated falls: Secondary | ICD-10-CM | POA: Diagnosis not present

## 2024-10-02 DIAGNOSIS — R2681 Unsteadiness on feet: Secondary | ICD-10-CM | POA: Diagnosis not present

## 2024-10-02 DIAGNOSIS — M6281 Muscle weakness (generalized): Secondary | ICD-10-CM | POA: Diagnosis not present

## 2024-10-03 DIAGNOSIS — L988 Other specified disorders of the skin and subcutaneous tissue: Secondary | ICD-10-CM | POA: Diagnosis not present

## 2024-10-03 DIAGNOSIS — M6281 Muscle weakness (generalized): Secondary | ICD-10-CM | POA: Diagnosis not present

## 2024-10-03 DIAGNOSIS — R296 Repeated falls: Secondary | ICD-10-CM | POA: Diagnosis not present

## 2024-10-03 DIAGNOSIS — R2681 Unsteadiness on feet: Secondary | ICD-10-CM | POA: Diagnosis not present

## 2024-10-04 DIAGNOSIS — R296 Repeated falls: Secondary | ICD-10-CM | POA: Diagnosis not present

## 2024-10-04 DIAGNOSIS — M6281 Muscle weakness (generalized): Secondary | ICD-10-CM | POA: Diagnosis not present

## 2024-10-04 DIAGNOSIS — R2681 Unsteadiness on feet: Secondary | ICD-10-CM | POA: Diagnosis not present

## 2024-10-05 DIAGNOSIS — R2681 Unsteadiness on feet: Secondary | ICD-10-CM | POA: Diagnosis not present

## 2024-10-05 DIAGNOSIS — M6281 Muscle weakness (generalized): Secondary | ICD-10-CM | POA: Diagnosis not present

## 2024-10-05 DIAGNOSIS — R296 Repeated falls: Secondary | ICD-10-CM | POA: Diagnosis not present

## 2024-10-06 DIAGNOSIS — R2681 Unsteadiness on feet: Secondary | ICD-10-CM | POA: Diagnosis not present

## 2024-10-06 DIAGNOSIS — R296 Repeated falls: Secondary | ICD-10-CM | POA: Diagnosis not present

## 2024-10-06 DIAGNOSIS — M6281 Muscle weakness (generalized): Secondary | ICD-10-CM | POA: Diagnosis not present

## 2024-10-07 DIAGNOSIS — M6281 Muscle weakness (generalized): Secondary | ICD-10-CM | POA: Diagnosis not present

## 2024-10-07 DIAGNOSIS — R2681 Unsteadiness on feet: Secondary | ICD-10-CM | POA: Diagnosis not present

## 2024-10-07 DIAGNOSIS — R296 Repeated falls: Secondary | ICD-10-CM | POA: Diagnosis not present

## 2024-10-09 DIAGNOSIS — R296 Repeated falls: Secondary | ICD-10-CM | POA: Diagnosis not present

## 2024-10-09 DIAGNOSIS — M6281 Muscle weakness (generalized): Secondary | ICD-10-CM | POA: Diagnosis not present

## 2024-10-09 DIAGNOSIS — R2681 Unsteadiness on feet: Secondary | ICD-10-CM | POA: Diagnosis not present

## 2024-10-10 DIAGNOSIS — I350 Nonrheumatic aortic (valve) stenosis: Secondary | ICD-10-CM | POA: Diagnosis not present

## 2024-10-10 DIAGNOSIS — R296 Repeated falls: Secondary | ICD-10-CM | POA: Diagnosis not present

## 2024-10-10 DIAGNOSIS — L988 Other specified disorders of the skin and subcutaneous tissue: Secondary | ICD-10-CM | POA: Diagnosis not present

## 2024-10-10 DIAGNOSIS — E1159 Type 2 diabetes mellitus with other circulatory complications: Secondary | ICD-10-CM | POA: Diagnosis not present

## 2024-10-10 DIAGNOSIS — N1831 Chronic kidney disease, stage 3a: Secondary | ICD-10-CM | POA: Diagnosis not present

## 2024-10-10 DIAGNOSIS — R2681 Unsteadiness on feet: Secondary | ICD-10-CM | POA: Diagnosis not present

## 2024-10-10 DIAGNOSIS — M6281 Muscle weakness (generalized): Secondary | ICD-10-CM | POA: Diagnosis not present

## 2024-10-11 DIAGNOSIS — R296 Repeated falls: Secondary | ICD-10-CM | POA: Diagnosis not present

## 2024-10-11 DIAGNOSIS — M6281 Muscle weakness (generalized): Secondary | ICD-10-CM | POA: Diagnosis not present

## 2024-10-11 DIAGNOSIS — R2681 Unsteadiness on feet: Secondary | ICD-10-CM | POA: Diagnosis not present

## 2024-10-13 DIAGNOSIS — R296 Repeated falls: Secondary | ICD-10-CM | POA: Diagnosis not present

## 2024-10-13 DIAGNOSIS — R2681 Unsteadiness on feet: Secondary | ICD-10-CM | POA: Diagnosis not present

## 2024-10-13 DIAGNOSIS — M6281 Muscle weakness (generalized): Secondary | ICD-10-CM | POA: Diagnosis not present

## 2024-10-14 DIAGNOSIS — M6281 Muscle weakness (generalized): Secondary | ICD-10-CM | POA: Diagnosis not present

## 2024-10-14 DIAGNOSIS — R296 Repeated falls: Secondary | ICD-10-CM | POA: Diagnosis not present

## 2024-10-14 DIAGNOSIS — R2681 Unsteadiness on feet: Secondary | ICD-10-CM | POA: Diagnosis not present

## 2024-10-16 DIAGNOSIS — R2681 Unsteadiness on feet: Secondary | ICD-10-CM | POA: Diagnosis not present

## 2024-10-16 DIAGNOSIS — M6281 Muscle weakness (generalized): Secondary | ICD-10-CM | POA: Diagnosis not present

## 2024-10-16 DIAGNOSIS — R296 Repeated falls: Secondary | ICD-10-CM | POA: Diagnosis not present

## 2024-10-17 DIAGNOSIS — F0154 Vascular dementia, unspecified severity, with anxiety: Secondary | ICD-10-CM | POA: Diagnosis not present

## 2024-10-17 DIAGNOSIS — R2681 Unsteadiness on feet: Secondary | ICD-10-CM | POA: Diagnosis not present

## 2024-10-17 DIAGNOSIS — M6281 Muscle weakness (generalized): Secondary | ICD-10-CM | POA: Diagnosis not present

## 2024-10-17 DIAGNOSIS — F413 Other mixed anxiety disorders: Secondary | ICD-10-CM | POA: Diagnosis not present

## 2024-10-17 DIAGNOSIS — R296 Repeated falls: Secondary | ICD-10-CM | POA: Diagnosis not present

## 2024-10-18 DIAGNOSIS — M6281 Muscle weakness (generalized): Secondary | ICD-10-CM | POA: Diagnosis not present

## 2024-10-18 DIAGNOSIS — R2681 Unsteadiness on feet: Secondary | ICD-10-CM | POA: Diagnosis not present

## 2024-10-18 DIAGNOSIS — R296 Repeated falls: Secondary | ICD-10-CM | POA: Diagnosis not present

## 2024-10-19 DIAGNOSIS — M6281 Muscle weakness (generalized): Secondary | ICD-10-CM | POA: Diagnosis not present

## 2024-10-19 DIAGNOSIS — R2681 Unsteadiness on feet: Secondary | ICD-10-CM | POA: Diagnosis not present

## 2024-10-19 DIAGNOSIS — R296 Repeated falls: Secondary | ICD-10-CM | POA: Diagnosis not present

## 2024-10-20 DIAGNOSIS — M6281 Muscle weakness (generalized): Secondary | ICD-10-CM | POA: Diagnosis not present

## 2024-10-20 DIAGNOSIS — R2681 Unsteadiness on feet: Secondary | ICD-10-CM | POA: Diagnosis not present

## 2024-10-20 DIAGNOSIS — R296 Repeated falls: Secondary | ICD-10-CM | POA: Diagnosis not present

## 2024-10-22 DIAGNOSIS — M6281 Muscle weakness (generalized): Secondary | ICD-10-CM | POA: Diagnosis not present

## 2024-10-22 DIAGNOSIS — R2681 Unsteadiness on feet: Secondary | ICD-10-CM | POA: Diagnosis not present

## 2024-10-22 DIAGNOSIS — R296 Repeated falls: Secondary | ICD-10-CM | POA: Diagnosis not present

## 2024-10-23 DIAGNOSIS — R4189 Other symptoms and signs involving cognitive functions and awareness: Secondary | ICD-10-CM | POA: Diagnosis not present

## 2024-10-23 DIAGNOSIS — I10 Essential (primary) hypertension: Secondary | ICD-10-CM | POA: Diagnosis not present

## 2024-10-23 DIAGNOSIS — I739 Peripheral vascular disease, unspecified: Secondary | ICD-10-CM | POA: Diagnosis not present

## 2024-10-23 DIAGNOSIS — R059 Cough, unspecified: Secondary | ICD-10-CM | POA: Diagnosis not present

## 2024-10-24 DIAGNOSIS — R2681 Unsteadiness on feet: Secondary | ICD-10-CM | POA: Diagnosis not present

## 2024-10-24 DIAGNOSIS — M6281 Muscle weakness (generalized): Secondary | ICD-10-CM | POA: Diagnosis not present

## 2024-10-24 DIAGNOSIS — R296 Repeated falls: Secondary | ICD-10-CM | POA: Diagnosis not present

## 2024-10-25 DIAGNOSIS — R2681 Unsteadiness on feet: Secondary | ICD-10-CM | POA: Diagnosis not present

## 2024-10-25 DIAGNOSIS — M6281 Muscle weakness (generalized): Secondary | ICD-10-CM | POA: Diagnosis not present

## 2024-10-25 DIAGNOSIS — R296 Repeated falls: Secondary | ICD-10-CM | POA: Diagnosis not present

## 2024-10-27 DIAGNOSIS — R296 Repeated falls: Secondary | ICD-10-CM | POA: Diagnosis not present

## 2024-10-27 DIAGNOSIS — R2681 Unsteadiness on feet: Secondary | ICD-10-CM | POA: Diagnosis not present

## 2024-10-27 DIAGNOSIS — M6281 Muscle weakness (generalized): Secondary | ICD-10-CM | POA: Diagnosis not present

## 2024-10-29 DIAGNOSIS — R296 Repeated falls: Secondary | ICD-10-CM | POA: Diagnosis not present

## 2024-10-29 DIAGNOSIS — R2681 Unsteadiness on feet: Secondary | ICD-10-CM | POA: Diagnosis not present

## 2024-10-29 DIAGNOSIS — M6281 Muscle weakness (generalized): Secondary | ICD-10-CM | POA: Diagnosis not present

## 2024-11-02 DIAGNOSIS — R0989 Other specified symptoms and signs involving the circulatory and respiratory systems: Secondary | ICD-10-CM | POA: Diagnosis not present

## 2024-11-07 DIAGNOSIS — S31829A Unspecified open wound of left buttock, initial encounter: Secondary | ICD-10-CM | POA: Diagnosis not present

## 2024-11-14 DIAGNOSIS — S31819A Unspecified open wound of right buttock, initial encounter: Secondary | ICD-10-CM | POA: Diagnosis not present

## 2024-11-14 DIAGNOSIS — S31829A Unspecified open wound of left buttock, initial encounter: Secondary | ICD-10-CM | POA: Diagnosis not present

## 2024-11-14 DIAGNOSIS — S31001A Unspecified open wound of lower back and pelvis with penetration into retroperitoneum, initial encounter: Secondary | ICD-10-CM | POA: Diagnosis not present

## 2024-11-16 DIAGNOSIS — Z794 Long term (current) use of insulin: Secondary | ICD-10-CM | POA: Diagnosis not present

## 2024-11-16 DIAGNOSIS — E1151 Type 2 diabetes mellitus with diabetic peripheral angiopathy without gangrene: Secondary | ICD-10-CM | POA: Diagnosis not present

## 2024-11-16 DIAGNOSIS — L602 Onychogryphosis: Secondary | ICD-10-CM | POA: Diagnosis not present

## 2024-11-16 DIAGNOSIS — L603 Nail dystrophy: Secondary | ICD-10-CM | POA: Diagnosis not present
